# Patient Record
Sex: Male | Born: 1935
Health system: Southern US, Community
[De-identification: ages and names within clinical notes are randomized; demographics above are authoritative.]

## PROBLEM LIST (undated history)

## (undated) DIAGNOSIS — M1990 Unspecified osteoarthritis, unspecified site: Secondary | ICD-10-CM

## (undated) DIAGNOSIS — E785 Hyperlipidemia, unspecified: Secondary | ICD-10-CM

## (undated) DIAGNOSIS — Z7189 Other specified counseling: Secondary | ICD-10-CM

## (undated) DIAGNOSIS — C9000 Multiple myeloma not having achieved remission: Secondary | ICD-10-CM

## (undated) DIAGNOSIS — K909 Intestinal malabsorption, unspecified: Secondary | ICD-10-CM

## (undated) DIAGNOSIS — I10 Essential (primary) hypertension: Secondary | ICD-10-CM

## (undated) HISTORY — PX: CATARACT EXTRACTION BILATERAL W/ ANTERIOR VITRECTOMY: SHX1304

## (undated) HISTORY — DX: Unspecified osteoarthritis, unspecified site: M19.90

## (undated) HISTORY — PX: LIMBAL STEM CELL TRANSPLANT: SHX1969

## (undated) HISTORY — DX: Hyperlipidemia, unspecified: E78.5

## (undated) HISTORY — DX: Other specified counseling: Z71.89

## (undated) HISTORY — DX: Essential (primary) hypertension: I10

---
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
## 2004-07-23 DIAGNOSIS — C9000 Multiple myeloma not having achieved remission: Secondary | ICD-10-CM

## 2004-07-23 HISTORY — DX: Multiple myeloma not having achieved remission: C90.00

## 2004-09-19 ENCOUNTER — Ambulatory Visit: Payer: Self-pay | Admitting: Hematology & Oncology

## 2004-10-09 ENCOUNTER — Ambulatory Visit (HOSPITAL_COMMUNITY): Admission: RE | Admit: 2004-10-09 | Discharge: 2004-10-09 | Payer: Self-pay | Admitting: Hematology & Oncology

## 2004-10-23 ENCOUNTER — Encounter (INDEPENDENT_AMBULATORY_CARE_PROVIDER_SITE_OTHER): Payer: Self-pay | Admitting: *Deleted

## 2004-10-23 ENCOUNTER — Ambulatory Visit (HOSPITAL_COMMUNITY): Admission: RE | Admit: 2004-10-23 | Discharge: 2004-10-23 | Payer: Self-pay | Admitting: Hematology & Oncology

## 2004-10-29 ENCOUNTER — Ambulatory Visit: Payer: Self-pay | Admitting: Hematology & Oncology

## 2004-11-06 ENCOUNTER — Ambulatory Visit: Payer: Self-pay | Admitting: Hematology & Oncology

## 2004-11-17 ENCOUNTER — Ambulatory Visit (HOSPITAL_COMMUNITY): Admission: RE | Admit: 2004-11-17 | Discharge: 2004-11-17 | Payer: Self-pay | Admitting: Hematology & Oncology

## 2004-12-29 ENCOUNTER — Ambulatory Visit: Payer: Self-pay | Admitting: Hematology & Oncology

## 2005-02-17 ENCOUNTER — Ambulatory Visit (HOSPITAL_COMMUNITY): Admission: RE | Admit: 2005-02-17 | Discharge: 2005-02-17 | Payer: Self-pay | Admitting: Hematology & Oncology

## 2005-02-17 ENCOUNTER — Encounter (INDEPENDENT_AMBULATORY_CARE_PROVIDER_SITE_OTHER): Payer: Self-pay | Admitting: *Deleted

## 2005-02-17 ENCOUNTER — Ambulatory Visit: Payer: Self-pay | Admitting: Hematology & Oncology

## 2005-02-23 ENCOUNTER — Ambulatory Visit: Payer: Self-pay | Admitting: Hematology & Oncology

## 2005-03-17 ENCOUNTER — Ambulatory Visit: Admission: RE | Admit: 2005-03-17 | Discharge: 2005-03-17 | Payer: Self-pay | Admitting: Hematology & Oncology

## 2005-03-18 ENCOUNTER — Ambulatory Visit (HOSPITAL_COMMUNITY): Admission: RE | Admit: 2005-03-18 | Discharge: 2005-03-18 | Payer: Self-pay | Admitting: Hematology & Oncology

## 2005-04-13 ENCOUNTER — Ambulatory Visit: Payer: Self-pay | Admitting: Hematology & Oncology

## 2005-04-29 DIAGNOSIS — Z9481 Bone marrow transplant status: Secondary | ICD-10-CM

## 2005-04-29 HISTORY — DX: Bone marrow transplant status: Z94.81

## 2005-06-02 ENCOUNTER — Ambulatory Visit: Payer: Self-pay | Admitting: Hematology & Oncology

## 2005-07-24 ENCOUNTER — Ambulatory Visit: Payer: Self-pay | Admitting: Hematology & Oncology

## 2005-09-17 ENCOUNTER — Ambulatory Visit: Payer: Self-pay | Admitting: Hematology & Oncology

## 2005-09-18 LAB — CBC WITH DIFFERENTIAL/PLATELET
BASO%: 0.6 % (ref 0.0–2.0)
Basophils Absolute: 0 10*3/uL (ref 0.0–0.1)
EOS%: 1.1 % (ref 0.0–7.0)
Eosinophils Absolute: 0.1 10*3/uL (ref 0.0–0.5)
HCT: 34.1 % — ABNORMAL LOW (ref 38.7–49.9)
HGB: 11.6 g/dL — ABNORMAL LOW (ref 13.0–17.1)
LYMPH%: 45.8 % (ref 14.0–48.0)
MCH: 33.3 pg (ref 28.0–33.4)
MCHC: 33.9 g/dL (ref 32.0–35.9)
MCV: 98 fL (ref 81.6–98.0)
MONO#: 0.3 10*3/uL (ref 0.1–0.9)
MONO%: 6.2 % (ref 0.0–13.0)
NEUT#: 2.6 10*3/uL (ref 1.5–6.5)
NEUT%: 46.3 % (ref 40.0–75.0)
Platelets: 227 10*3/uL (ref 145–400)
RBC: 3.47 10*6/uL — ABNORMAL LOW (ref 4.20–5.71)
RDW: 14.3 % (ref 11.2–14.6)
WBC: 5.6 10*3/uL (ref 4.0–10.0)
lymph#: 2.5 10*3/uL (ref 0.9–3.3)

## 2005-09-18 LAB — COMPREHENSIVE METABOLIC PANEL
ALT: 15 U/L (ref 0–40)
AST: 20 U/L (ref 0–37)
Albumin: 4.4 g/dL (ref 3.5–5.2)
Alkaline Phosphatase: 47 U/L (ref 39–117)
BUN: 18 mg/dL (ref 6–23)
CO2: 23 mEq/L (ref 19–32)
Calcium: 9.1 mg/dL (ref 8.4–10.5)
Chloride: 105 mEq/L (ref 96–112)
Creatinine, Ser: 1.2 mg/dL (ref 0.4–1.5)
Glucose, Bld: 84 mg/dL (ref 70–99)
Potassium: 3.9 mEq/L (ref 3.5–5.3)
Sodium: 138 mEq/L (ref 135–145)
Total Bilirubin: 0.7 mg/dL (ref 0.3–1.2)
Total Protein: 7.1 g/dL (ref 6.0–8.3)

## 2005-10-16 LAB — BASIC METABOLIC PANEL
BUN: 17 mg/dL (ref 6–23)
CO2: 25 mEq/L (ref 19–32)
Calcium: 9.1 mg/dL (ref 8.4–10.5)
Chloride: 105 mEq/L (ref 96–112)
Creatinine, Ser: 1 mg/dL (ref 0.4–1.5)
Glucose, Bld: 92 mg/dL (ref 70–99)
Potassium: 3.8 mEq/L (ref 3.5–5.3)
Sodium: 137 mEq/L (ref 135–145)

## 2005-10-16 LAB — CBC WITH DIFFERENTIAL/PLATELET
BASO%: 0.4 % (ref 0.0–2.0)
Basophils Absolute: 0 10*3/uL (ref 0.0–0.1)
EOS%: 0.7 % (ref 0.0–7.0)
Eosinophils Absolute: 0 10*3/uL (ref 0.0–0.5)
HCT: 33.1 % — ABNORMAL LOW (ref 38.7–49.9)
HGB: 11.2 g/dL — ABNORMAL LOW (ref 13.0–17.1)
LYMPH%: 48.1 % — ABNORMAL HIGH (ref 14.0–48.0)
MCH: 32.7 pg (ref 28.0–33.4)
MCHC: 33.9 g/dL (ref 32.0–35.9)
MCV: 96.4 fL (ref 81.6–98.0)
MONO#: 0.3 10*3/uL (ref 0.1–0.9)
MONO%: 6.5 % (ref 0.0–13.0)
NEUT#: 2.4 10*3/uL (ref 1.5–6.5)
NEUT%: 44.3 % (ref 40.0–75.0)
Platelets: 206 10*3/uL (ref 145–400)
RBC: 3.43 10*6/uL — ABNORMAL LOW (ref 4.20–5.71)
RDW: 13.9 % (ref 11.2–14.6)
WBC: 5.4 10*3/uL (ref 4.0–10.0)
lymph#: 2.6 10*3/uL (ref 0.9–3.3)

## 2005-11-11 ENCOUNTER — Ambulatory Visit: Payer: Self-pay | Admitting: Hematology & Oncology

## 2005-11-13 LAB — BASIC METABOLIC PANEL
BUN: 15 mg/dL (ref 6–23)
CO2: 24 mEq/L (ref 19–32)
Calcium: 9.3 mg/dL (ref 8.4–10.5)
Chloride: 107 mEq/L (ref 96–112)
Creatinine, Ser: 1.17 mg/dL (ref 0.40–1.50)
Glucose, Bld: 98 mg/dL (ref 70–99)
Potassium: 4 mEq/L (ref 3.5–5.3)
Sodium: 140 mEq/L (ref 135–145)

## 2005-12-11 LAB — CBC WITH DIFFERENTIAL/PLATELET
BASO%: 0.4 % (ref 0.0–2.0)
Basophils Absolute: 0 10*3/uL (ref 0.0–0.1)
EOS%: 0.8 % (ref 0.0–7.0)
Eosinophils Absolute: 0 10*3/uL (ref 0.0–0.5)
HCT: 32.2 % — ABNORMAL LOW (ref 38.7–49.9)
HGB: 10.8 g/dL — ABNORMAL LOW (ref 13.0–17.1)
LYMPH%: 36.2 % (ref 14.0–48.0)
MCH: 32.5 pg (ref 28.0–33.4)
MCHC: 33.7 g/dL (ref 32.0–35.9)
MCV: 96.4 fL (ref 81.6–98.0)
MONO#: 0.4 10*3/uL (ref 0.1–0.9)
MONO%: 6.7 % (ref 0.0–13.0)
NEUT#: 3.1 10*3/uL (ref 1.5–6.5)
NEUT%: 55.9 % (ref 40.0–75.0)
Platelets: 220 10*3/uL (ref 145–400)
RBC: 3.34 10*6/uL — ABNORMAL LOW (ref 4.20–5.71)
RDW: 14.7 % — ABNORMAL HIGH (ref 11.2–14.6)
WBC: 5.5 10*3/uL (ref 4.0–10.0)
lymph#: 2 10*3/uL (ref 0.9–3.3)

## 2005-12-15 LAB — COMPREHENSIVE METABOLIC PANEL
ALT: 12 U/L (ref 0–40)
AST: 15 U/L (ref 0–37)
Albumin: 4.3 g/dL (ref 3.5–5.2)
Alkaline Phosphatase: 47 U/L (ref 39–117)
BUN: 16 mg/dL (ref 6–23)
CO2: 23 mEq/L (ref 19–32)
Calcium: 9 mg/dL (ref 8.4–10.5)
Chloride: 106 mEq/L (ref 96–112)
Creatinine, Ser: 1.28 mg/dL (ref 0.40–1.50)
Glucose, Bld: 96 mg/dL (ref 70–99)
Potassium: 3.7 mEq/L (ref 3.5–5.3)
Sodium: 138 mEq/L (ref 135–145)
Total Bilirubin: 1 mg/dL (ref 0.3–1.2)
Total Protein: 6.7 g/dL (ref 6.0–8.3)

## 2005-12-15 LAB — PROTEIN ELECTROPHORESIS, SERUM
Albumin ELP: 63.9 % (ref 55.8–66.1)
Alpha-1-Globulin: 3.9 % (ref 2.9–4.9)
Alpha-2-Globulin: 9.1 % (ref 7.1–11.8)
Beta 2: 3.5 % (ref 3.2–6.5)
Beta Globulin: 5.1 % (ref 4.7–7.2)
Gamma Globulin: 14.5 % (ref 11.1–18.8)
M-Spike, %: 0.06 g/dL
Total Protein, Serum Electrophoresis: 6.7 g/dL (ref 6.0–8.3)

## 2005-12-15 LAB — IGG, IGA, IGM
IgA: 81 mg/dL (ref 68–378)
IgG (Immunoglobin G), Serum: 1110 mg/dL (ref 694–1618)
IgM, Serum: 84 mg/dL (ref 60–263)

## 2005-12-15 LAB — BETA 2 MICROGLOBULIN, SERUM: Beta-2 Microglobulin: 1.86 mg/L — ABNORMAL HIGH (ref 1.01–1.73)

## 2005-12-17 LAB — BASIC METABOLIC PANEL
BUN: 17 mg/dL (ref 6–23)
CO2: 22 mEq/L (ref 19–32)
Calcium: 9.3 mg/dL (ref 8.4–10.5)
Chloride: 105 mEq/L (ref 96–112)
Creatinine, Ser: 1.14 mg/dL (ref 0.40–1.50)
Glucose, Bld: 93 mg/dL (ref 70–99)
Potassium: 3.7 mEq/L (ref 3.5–5.3)
Sodium: 136 mEq/L (ref 135–145)

## 2006-01-06 ENCOUNTER — Ambulatory Visit: Payer: Self-pay | Admitting: Hematology & Oncology

## 2006-01-08 LAB — CBC WITH DIFFERENTIAL/PLATELET
BASO%: 1.2 % (ref 0.0–2.0)
Basophils Absolute: 0.1 10*3/uL (ref 0.0–0.1)
EOS%: 0.8 % (ref 0.0–7.0)
Eosinophils Absolute: 0 10*3/uL (ref 0.0–0.5)
HCT: 36.8 % — ABNORMAL LOW (ref 38.7–49.9)
HGB: 12.4 g/dL — ABNORMAL LOW (ref 13.0–17.1)
LYMPH%: 50.7 % — ABNORMAL HIGH (ref 14.0–48.0)
MCH: 32.4 pg (ref 28.0–33.4)
MCHC: 33.6 g/dL (ref 32.0–35.9)
MCV: 96.2 fL (ref 81.6–98.0)
MONO#: 0.4 10*3/uL (ref 0.1–0.9)
MONO%: 6.8 % (ref 0.0–13.0)
NEUT#: 2.2 10*3/uL (ref 1.5–6.5)
NEUT%: 40.5 % (ref 40.0–75.0)
Platelets: 179 10*3/uL (ref 145–400)
RBC: 3.82 10*6/uL — ABNORMAL LOW (ref 4.20–5.71)
RDW: 13.2 % (ref 11.2–14.6)
WBC: 5.3 10*3/uL (ref 4.0–10.0)
lymph#: 2.7 10*3/uL (ref 0.9–3.3)

## 2006-01-12 LAB — COMPREHENSIVE METABOLIC PANEL
ALT: 15 U/L (ref 0–40)
AST: 19 U/L (ref 0–37)
Albumin: 4.3 g/dL (ref 3.5–5.2)
Alkaline Phosphatase: 47 U/L (ref 39–117)
BUN: 16 mg/dL (ref 6–23)
CO2: 23 mEq/L (ref 19–32)
Calcium: 9.3 mg/dL (ref 8.4–10.5)
Chloride: 108 mEq/L (ref 96–112)
Creatinine, Ser: 1.03 mg/dL (ref 0.40–1.50)
Glucose, Bld: 91 mg/dL (ref 70–99)
Potassium: 3.9 mEq/L (ref 3.5–5.3)
Sodium: 139 mEq/L (ref 135–145)
Total Bilirubin: 0.8 mg/dL (ref 0.3–1.2)
Total Protein: 6.8 g/dL (ref 6.0–8.3)

## 2006-01-12 LAB — PROTEIN ELECTROPHORESIS, SERUM
Albumin ELP: 60.8 % (ref 55.8–66.1)
Alpha-1-Globulin: 4.1 % (ref 2.9–4.9)
Alpha-2-Globulin: 10 % (ref 7.1–11.8)
Beta 2: 4.1 % (ref 3.2–6.5)
Beta Globulin: 5.9 % (ref 4.7–7.2)
Gamma Globulin: 15.1 % (ref 11.1–18.8)
M-Spike, %: 0.05 g/dL
Total Protein, Serum Electrophoresis: 6.8 g/dL (ref 6.0–8.3)

## 2006-02-05 LAB — CBC WITH DIFFERENTIAL/PLATELET
BASO%: 0.6 % (ref 0.0–2.0)
Basophils Absolute: 0 10*3/uL (ref 0.0–0.1)
EOS%: 1 % (ref 0.0–7.0)
Eosinophils Absolute: 0.1 10*3/uL (ref 0.0–0.5)
HCT: 34.3 % — ABNORMAL LOW (ref 38.7–49.9)
HGB: 11.8 g/dL — ABNORMAL LOW (ref 13.0–17.1)
LYMPH%: 32.5 % (ref 14.0–48.0)
MCH: 32 pg (ref 28.0–33.4)
MCHC: 34.4 g/dL (ref 32.0–35.9)
MCV: 93.3 fL (ref 81.6–98.0)
MONO#: 0.5 10*3/uL (ref 0.1–0.9)
MONO%: 7.1 % (ref 0.0–13.0)
NEUT#: 4.4 10*3/uL (ref 1.5–6.5)
NEUT%: 58.8 % (ref 40.0–75.0)
Platelets: 192 10*3/uL (ref 145–400)
RBC: 3.68 10*6/uL — ABNORMAL LOW (ref 4.20–5.71)
RDW: 12.7 % (ref 11.2–14.6)
WBC: 7.4 10*3/uL (ref 4.0–10.0)
lymph#: 2.4 10*3/uL (ref 0.9–3.3)

## 2006-02-08 LAB — COMPREHENSIVE METABOLIC PANEL
ALT: 12 U/L (ref 0–40)
AST: 13 U/L (ref 0–37)
Albumin: 4.2 g/dL (ref 3.5–5.2)
Alkaline Phosphatase: 50 U/L (ref 39–117)
BUN: 15 mg/dL (ref 6–23)
CO2: 27 mEq/L (ref 19–32)
Calcium: 9.5 mg/dL (ref 8.4–10.5)
Chloride: 106 mEq/L (ref 96–112)
Creatinine, Ser: 1.08 mg/dL (ref 0.40–1.50)
Glucose, Bld: 131 mg/dL — ABNORMAL HIGH (ref 70–99)
Potassium: 3.6 mEq/L (ref 3.5–5.3)
Sodium: 139 mEq/L (ref 135–145)
Total Bilirubin: 0.9 mg/dL (ref 0.3–1.2)
Total Protein: 6.5 g/dL (ref 6.0–8.3)

## 2006-02-08 LAB — PROTEIN ELECTROPHORESIS, SERUM
Albumin ELP: 62.5 % (ref 55.8–66.1)
Alpha-1-Globulin: 3.9 % (ref 2.9–4.9)
Alpha-2-Globulin: 9.7 % (ref 7.1–11.8)
Beta 2: 3.7 % (ref 3.2–6.5)
Beta Globulin: 5.4 % (ref 4.7–7.2)
Gamma Globulin: 14.8 % (ref 11.1–18.8)
M-Spike, %: 0.07 g/dL
Total Protein, Serum Electrophoresis: 6.5 g/dL (ref 6.0–8.3)

## 2006-02-08 LAB — IGG, IGA, IGM
IgA: 81 mg/dL (ref 68–378)
IgG (Immunoglobin G), Serum: 1070 mg/dL (ref 694–1618)
IgM, Serum: 82 mg/dL (ref 60–263)

## 2006-02-08 LAB — KAPPA/LAMBDA LIGHT CHAINS
Kappa free light chain: 1.55 mg/dL (ref 0.33–1.94)
Kappa:Lambda Ratio: 1.42 (ref 0.26–1.65)
Lambda Free Lght Chn: 1.09 mg/dL (ref 0.57–2.63)

## 2006-02-08 LAB — BETA 2 MICROGLOBULIN, SERUM: Beta-2 Microglobulin: 2.05 mg/L — ABNORMAL HIGH (ref 1.01–1.73)

## 2006-03-03 ENCOUNTER — Ambulatory Visit: Payer: Self-pay | Admitting: Hematology & Oncology

## 2006-03-05 LAB — COMPREHENSIVE METABOLIC PANEL
ALT: 10 U/L (ref 0–40)
AST: 13 U/L (ref 0–37)
Albumin: 4.3 g/dL (ref 3.5–5.2)
Alkaline Phosphatase: 50 U/L (ref 39–117)
BUN: 17 mg/dL (ref 6–23)
CO2: 24 mEq/L (ref 19–32)
Calcium: 9.3 mg/dL (ref 8.4–10.5)
Chloride: 107 mEq/L (ref 96–112)
Creatinine, Ser: 1.08 mg/dL (ref 0.40–1.50)
Glucose, Bld: 99 mg/dL (ref 70–99)
Potassium: 4 mEq/L (ref 3.5–5.3)
Sodium: 140 mEq/L (ref 135–145)
Total Bilirubin: 0.8 mg/dL (ref 0.3–1.2)
Total Protein: 6.9 g/dL (ref 6.0–8.3)

## 2006-03-05 LAB — CBC WITH DIFFERENTIAL/PLATELET
BASO%: 0.3 % (ref 0.0–2.0)
Basophils Absolute: 0 10*3/uL (ref 0.0–0.1)
EOS%: 0.9 % (ref 0.0–7.0)
Eosinophils Absolute: 0 10*3/uL (ref 0.0–0.5)
HCT: 36.2 % — ABNORMAL LOW (ref 38.7–49.9)
HGB: 12.3 g/dL — ABNORMAL LOW (ref 13.0–17.1)
LYMPH%: 49.7 % — ABNORMAL HIGH (ref 14.0–48.0)
MCH: 32.2 pg (ref 28.0–33.4)
MCHC: 34 g/dL (ref 32.0–35.9)
MCV: 94.6 fL (ref 81.6–98.0)
MONO#: 0.4 10*3/uL (ref 0.1–0.9)
MONO%: 6.7 % (ref 0.0–13.0)
NEUT#: 2.2 10*3/uL (ref 1.5–6.5)
NEUT%: 42.4 % (ref 40.0–75.0)
Platelets: 215 10*3/uL (ref 145–400)
RBC: 3.82 10*6/uL — ABNORMAL LOW (ref 4.20–5.71)
RDW: 14.3 % (ref 11.2–14.6)
WBC: 5.3 10*3/uL (ref 4.0–10.0)
lymph#: 2.6 10*3/uL (ref 0.9–3.3)

## 2006-04-02 LAB — CBC WITH DIFFERENTIAL/PLATELET
BASO%: 0.4 % (ref 0.0–2.0)
Basophils Absolute: 0 10*3/uL (ref 0.0–0.1)
EOS%: 1.2 % (ref 0.0–7.0)
Eosinophils Absolute: 0.1 10*3/uL (ref 0.0–0.5)
HCT: 33.8 % — ABNORMAL LOW (ref 38.7–49.9)
HGB: 11.5 g/dL — ABNORMAL LOW (ref 13.0–17.1)
LYMPH%: 40.6 % (ref 14.0–48.0)
MCH: 31.9 pg (ref 28.0–33.4)
MCHC: 34.2 g/dL (ref 32.0–35.9)
MCV: 93.3 fL (ref 81.6–98.0)
MONO#: 0.3 10*3/uL (ref 0.1–0.9)
MONO%: 7.7 % (ref 0.0–13.0)
NEUT#: 2.3 10*3/uL (ref 1.5–6.5)
NEUT%: 50.1 % (ref 40.0–75.0)
Platelets: 228 10*3/uL (ref 145–400)
RBC: 3.62 10*6/uL — ABNORMAL LOW (ref 4.20–5.71)
RDW: 14.7 % — ABNORMAL HIGH (ref 11.2–14.6)
WBC: 4.5 10*3/uL (ref 4.0–10.0)
lymph#: 1.8 10*3/uL (ref 0.9–3.3)

## 2006-04-05 LAB — PROTEIN ELECTROPHORESIS, SERUM
Albumin ELP: 62 % (ref 55.8–66.1)
Alpha-1-Globulin: 3.9 % (ref 2.9–4.9)
Alpha-2-Globulin: 9.5 % (ref 7.1–11.8)
Beta 2: 3.9 % (ref 3.2–6.5)
Beta Globulin: 5.6 % (ref 4.7–7.2)
Gamma Globulin: 15.1 % (ref 11.1–18.8)
Total Protein, Serum Electrophoresis: 6.8 g/dL (ref 6.0–8.3)

## 2006-04-05 LAB — COMPREHENSIVE METABOLIC PANEL
ALT: 13 U/L (ref 0–53)
AST: 14 U/L (ref 0–37)
Albumin: 4.1 g/dL (ref 3.5–5.2)
Alkaline Phosphatase: 48 U/L (ref 39–117)
BUN: 17 mg/dL (ref 6–23)
CO2: 26 mEq/L (ref 19–32)
Calcium: 9.4 mg/dL (ref 8.4–10.5)
Chloride: 108 mEq/L (ref 96–112)
Creatinine, Ser: 1.1 mg/dL (ref 0.40–1.50)
Glucose, Bld: 92 mg/dL (ref 70–99)
Potassium: 4.1 mEq/L (ref 3.5–5.3)
Sodium: 140 mEq/L (ref 135–145)
Total Bilirubin: 0.8 mg/dL (ref 0.3–1.2)
Total Protein: 6.8 g/dL (ref 6.0–8.3)

## 2006-04-05 LAB — IGG, IGA, IGM
IgA: 86 mg/dL (ref 68–378)
IgG (Immunoglobin G), Serum: 1150 mg/dL (ref 694–1618)
IgM, Serum: 101 mg/dL (ref 60–263)

## 2006-04-05 LAB — BETA 2 MICROGLOBULIN, SERUM: Beta-2 Microglobulin: 1.65 mg/L (ref 1.01–1.73)

## 2006-04-05 LAB — LACTATE DEHYDROGENASE: LDH: 169 U/L (ref 94–250)

## 2006-04-05 LAB — KAPPA/LAMBDA LIGHT CHAINS
Kappa free light chain: 1.75 mg/dL (ref 0.33–1.94)
Kappa:Lambda Ratio: 1.86 — ABNORMAL HIGH (ref 0.26–1.65)
Lambda Free Lght Chn: 0.94 mg/dL (ref 0.57–2.63)

## 2006-04-27 ENCOUNTER — Ambulatory Visit: Payer: Self-pay | Admitting: Hematology & Oncology

## 2006-04-30 LAB — BASIC METABOLIC PANEL
BUN: 14 mg/dL (ref 6–23)
CO2: 25 mEq/L (ref 19–32)
Calcium: 9.3 mg/dL (ref 8.4–10.5)
Chloride: 107 mEq/L (ref 96–112)
Creatinine, Ser: 1.09 mg/dL (ref 0.40–1.50)
Glucose, Bld: 112 mg/dL — ABNORMAL HIGH (ref 70–99)
Potassium: 3.9 mEq/L (ref 3.5–5.3)
Sodium: 141 mEq/L (ref 135–145)

## 2006-05-28 LAB — CBC WITH DIFFERENTIAL/PLATELET
BASO%: 0.7 % (ref 0.0–2.0)
Basophils Absolute: 0 10*3/uL (ref 0.0–0.1)
EOS%: 2 % (ref 0.0–7.0)
Eosinophils Absolute: 0.1 10*3/uL (ref 0.0–0.5)
HCT: 33.9 % — ABNORMAL LOW (ref 38.7–49.9)
HGB: 11.7 g/dL — ABNORMAL LOW (ref 13.0–17.1)
LYMPH%: 46.4 % (ref 14.0–48.0)
MCH: 31.3 pg (ref 28.0–33.4)
MCHC: 34.4 g/dL (ref 32.0–35.9)
MCV: 90.7 fL (ref 81.6–98.0)
MONO#: 0.4 10*3/uL (ref 0.1–0.9)
MONO%: 7.5 % (ref 0.0–13.0)
NEUT#: 2.5 10*3/uL (ref 1.5–6.5)
NEUT%: 43.3 % (ref 40.0–75.0)
Platelets: 192 10*3/uL (ref 145–400)
RBC: 3.73 10*6/uL — ABNORMAL LOW (ref 4.20–5.71)
RDW: 12 % (ref 11.2–14.6)
WBC: 5.7 10*3/uL (ref 4.0–10.0)
lymph#: 2.6 10*3/uL (ref 0.9–3.3)

## 2006-06-01 LAB — PROTEIN ELECTROPHORESIS, SERUM
Albumin ELP: 61.4 % (ref 55.8–66.1)
Alpha-1-Globulin: 4 % (ref 2.9–4.9)
Alpha-2-Globulin: 10.5 % (ref 7.1–11.8)
Beta 2: 3.9 % (ref 3.2–6.5)
Beta Globulin: 5.6 % (ref 4.7–7.2)
Gamma Globulin: 14.6 % (ref 11.1–18.8)
Total Protein, Serum Electrophoresis: 7 g/dL (ref 6.0–8.3)

## 2006-06-01 LAB — IGG, IGA, IGM
IgA: 89 mg/dL (ref 68–378)
IgG (Immunoglobin G), Serum: 1130 mg/dL (ref 694–1618)
IgM, Serum: 105 mg/dL (ref 60–263)

## 2006-06-01 LAB — COMPREHENSIVE METABOLIC PANEL
ALT: 11 U/L (ref 0–53)
AST: 17 U/L (ref 0–37)
Albumin: 4.5 g/dL (ref 3.5–5.2)
Alkaline Phosphatase: 49 U/L (ref 39–117)
BUN: 19 mg/dL (ref 6–23)
CO2: 25 mEq/L (ref 19–32)
Calcium: 9.5 mg/dL (ref 8.4–10.5)
Chloride: 105 mEq/L (ref 96–112)
Creatinine, Ser: 1.12 mg/dL (ref 0.40–1.50)
Glucose, Bld: 91 mg/dL (ref 70–99)
Potassium: 4 mEq/L (ref 3.5–5.3)
Sodium: 140 mEq/L (ref 135–145)
Total Bilirubin: 0.8 mg/dL (ref 0.3–1.2)
Total Protein: 7 g/dL (ref 6.0–8.3)

## 2006-06-01 LAB — LACTATE DEHYDROGENASE: LDH: 206 U/L (ref 94–250)

## 2006-06-01 LAB — BETA 2 MICROGLOBULIN, SERUM: Beta-2 Microglobulin: 1.69 mg/L (ref 1.01–1.73)

## 2006-06-23 ENCOUNTER — Ambulatory Visit: Payer: Self-pay | Admitting: Hematology & Oncology

## 2006-06-23 LAB — CBC WITH DIFFERENTIAL/PLATELET
BASO%: 1 % (ref 0.0–2.0)
Basophils Absolute: 0.1 10*3/uL (ref 0.0–0.1)
EOS%: 1.1 % (ref 0.0–7.0)
Eosinophils Absolute: 0.1 10*3/uL (ref 0.0–0.5)
HCT: 34.7 % — ABNORMAL LOW (ref 38.7–49.9)
HGB: 11.9 g/dL — ABNORMAL LOW (ref 13.0–17.1)
LYMPH%: 46.5 % (ref 14.0–48.0)
MCH: 31.8 pg (ref 28.0–33.4)
MCHC: 34.3 g/dL (ref 32.0–35.9)
MCV: 92.8 fL (ref 81.6–98.0)
MONO#: 0.4 10*3/uL (ref 0.1–0.9)
MONO%: 6.3 % (ref 0.0–13.0)
NEUT#: 2.8 10*3/uL (ref 1.5–6.5)
NEUT%: 45.1 % (ref 40.0–75.0)
Platelets: 207 10*3/uL (ref 145–400)
RBC: 3.74 10*6/uL — ABNORMAL LOW (ref 4.20–5.71)
RDW: 12.1 % (ref 11.2–14.6)
WBC: 6.3 10*3/uL (ref 4.0–10.0)
lymph#: 2.9 10*3/uL (ref 0.9–3.3)

## 2006-06-23 LAB — BASIC METABOLIC PANEL
BUN: 14 mg/dL (ref 6–23)
CO2: 26 mEq/L (ref 19–32)
Calcium: 9.3 mg/dL (ref 8.4–10.5)
Chloride: 105 mEq/L (ref 96–112)
Creatinine, Ser: 1.08 mg/dL (ref 0.40–1.50)
Glucose, Bld: 86 mg/dL (ref 70–99)
Potassium: 4.2 mEq/L (ref 3.5–5.3)
Sodium: 140 mEq/L (ref 135–145)

## 2006-07-23 LAB — BASIC METABOLIC PANEL
BUN: 17 mg/dL (ref 6–23)
CO2: 26 mEq/L (ref 19–32)
Calcium: 9.1 mg/dL (ref 8.4–10.5)
Chloride: 107 mEq/L (ref 96–112)
Creatinine, Ser: 1.05 mg/dL (ref 0.40–1.50)
Glucose, Bld: 105 mg/dL — ABNORMAL HIGH (ref 70–99)
Potassium: 3.9 mEq/L (ref 3.5–5.3)
Sodium: 140 mEq/L (ref 135–145)

## 2006-08-04 ENCOUNTER — Ambulatory Visit: Payer: Self-pay | Admitting: Hematology & Oncology

## 2006-08-06 LAB — CBC WITH DIFFERENTIAL/PLATELET
BASO%: 0.2 % (ref 0.0–2.0)
Basophils Absolute: 0 10*3/uL (ref 0.0–0.1)
EOS%: 1 % (ref 0.0–7.0)
Eosinophils Absolute: 0.1 10*3/uL (ref 0.0–0.5)
HCT: 35.3 % — ABNORMAL LOW (ref 38.7–49.9)
HGB: 12.1 g/dL — ABNORMAL LOW (ref 13.0–17.1)
LYMPH%: 36.5 % (ref 14.0–48.0)
MCH: 31.2 pg (ref 28.0–33.4)
MCHC: 34.1 g/dL (ref 32.0–35.9)
MCV: 91.5 fL (ref 81.6–98.0)
MONO#: 0.4 10*3/uL (ref 0.1–0.9)
MONO%: 6.9 % (ref 0.0–13.0)
NEUT#: 3 10*3/uL (ref 1.5–6.5)
NEUT%: 55.4 % (ref 40.0–75.0)
Platelets: 243 10*3/uL (ref 145–400)
RBC: 3.86 10*6/uL — ABNORMAL LOW (ref 4.20–5.71)
RDW: 14.1 % (ref 11.2–14.6)
WBC: 5.5 10*3/uL (ref 4.0–10.0)
lymph#: 2 10*3/uL (ref 0.9–3.3)

## 2006-08-10 LAB — IGG, IGA, IGM
IgA: 88 mg/dL (ref 68–378)
IgG (Immunoglobin G), Serum: 1150 mg/dL (ref 694–1618)
IgM, Serum: 97 mg/dL (ref 60–263)

## 2006-08-10 LAB — COMPREHENSIVE METABOLIC PANEL
ALT: 11 U/L (ref 0–53)
AST: 15 U/L (ref 0–37)
Albumin: 4.2 g/dL (ref 3.5–5.2)
Alkaline Phosphatase: 57 U/L (ref 39–117)
BUN: 16 mg/dL (ref 6–23)
CO2: 27 mEq/L (ref 19–32)
Calcium: 9.4 mg/dL (ref 8.4–10.5)
Chloride: 104 mEq/L (ref 96–112)
Creatinine, Ser: 1.25 mg/dL (ref 0.40–1.50)
Glucose, Bld: 95 mg/dL (ref 70–99)
Potassium: 3.9 mEq/L (ref 3.5–5.3)
Sodium: 140 mEq/L (ref 135–145)
Total Bilirubin: 0.9 mg/dL (ref 0.3–1.2)
Total Protein: 6.9 g/dL (ref 6.0–8.3)

## 2006-08-10 LAB — PROTEIN ELECTROPHORESIS, SERUM
Albumin ELP: 61.4 % (ref 55.8–66.1)
Alpha-1-Globulin: 4.1 % (ref 2.9–4.9)
Alpha-2-Globulin: 10.1 % (ref 7.1–11.8)
Beta 2: 4 % (ref 3.2–6.5)
Beta Globulin: 5.7 % (ref 4.7–7.2)
Gamma Globulin: 14.7 % (ref 11.1–18.8)
Total Protein, Serum Electrophoresis: 6.9 g/dL (ref 6.0–8.3)

## 2006-08-10 LAB — KAPPA/LAMBDA LIGHT CHAINS
Kappa free light chain: 1.19 mg/dL (ref 0.33–1.94)
Kappa:Lambda Ratio: 1.17 (ref 0.26–1.65)
Lambda Free Lght Chn: 1.02 mg/dL (ref 0.57–2.63)

## 2006-08-10 LAB — BETA 2 MICROGLOBULIN, SERUM: Beta-2 Microglobulin: 2.06 mg/L — ABNORMAL HIGH (ref 1.01–1.73)

## 2006-08-20 LAB — BASIC METABOLIC PANEL
BUN: 15 mg/dL (ref 6–23)
CO2: 23 mEq/L (ref 19–32)
Calcium: 9.6 mg/dL (ref 8.4–10.5)
Chloride: 107 mEq/L (ref 96–112)
Creatinine, Ser: 1.02 mg/dL (ref 0.40–1.50)
Glucose, Bld: 88 mg/dL (ref 70–99)
Potassium: 3.9 mEq/L (ref 3.5–5.3)
Sodium: 140 mEq/L (ref 135–145)

## 2006-09-17 LAB — BASIC METABOLIC PANEL
BUN: 13 mg/dL (ref 6–23)
CO2: 24 mEq/L (ref 19–32)
Calcium: 9.8 mg/dL (ref 8.4–10.5)
Chloride: 107 mEq/L (ref 96–112)
Creatinine, Ser: 1.18 mg/dL (ref 0.40–1.50)
Glucose, Bld: 81 mg/dL (ref 70–99)
Potassium: 4.1 mEq/L (ref 3.5–5.3)
Sodium: 142 mEq/L (ref 135–145)

## 2006-10-13 ENCOUNTER — Ambulatory Visit: Payer: Self-pay | Admitting: Hematology & Oncology

## 2006-10-15 LAB — BASIC METABOLIC PANEL
BUN: 21 mg/dL (ref 6–23)
CO2: 25 mEq/L (ref 19–32)
Calcium: 9.5 mg/dL (ref 8.4–10.5)
Chloride: 106 mEq/L (ref 96–112)
Creatinine, Ser: 1.15 mg/dL (ref 0.40–1.50)
Glucose, Bld: 94 mg/dL (ref 70–99)
Potassium: 4.6 mEq/L (ref 3.5–5.3)
Sodium: 141 mEq/L (ref 135–145)

## 2006-11-12 LAB — CBC WITH DIFFERENTIAL/PLATELET
BASO%: 1.1 % (ref 0.0–2.0)
Basophils Absolute: 0.1 10*3/uL (ref 0.0–0.1)
EOS%: 1.6 % (ref 0.0–7.0)
Eosinophils Absolute: 0.1 10*3/uL (ref 0.0–0.5)
HCT: 34.2 % — ABNORMAL LOW (ref 38.7–49.9)
HGB: 11.8 g/dL — ABNORMAL LOW (ref 13.0–17.1)
LYMPH%: 42.6 % (ref 14.0–48.0)
MCH: 31.7 pg (ref 28.0–33.4)
MCHC: 34.6 g/dL (ref 32.0–35.9)
MCV: 91.6 fL (ref 81.6–98.0)
MONO#: 0.5 10*3/uL (ref 0.1–0.9)
MONO%: 7.3 % (ref 0.0–13.0)
NEUT#: 3.1 10*3/uL (ref 1.5–6.5)
NEUT%: 47.4 % (ref 40.0–75.0)
Platelets: 207 10*3/uL (ref 145–400)
RBC: 3.74 10*6/uL — ABNORMAL LOW (ref 4.20–5.71)
RDW: 12.1 % (ref 11.2–14.6)
WBC: 6.6 10*3/uL (ref 4.0–10.0)
lymph#: 2.8 10*3/uL (ref 0.9–3.3)

## 2006-11-16 LAB — COMPREHENSIVE METABOLIC PANEL
ALT: 16 U/L (ref 0–53)
AST: 20 U/L (ref 0–37)
Albumin: 4.7 g/dL (ref 3.5–5.2)
Alkaline Phosphatase: 64 U/L (ref 39–117)
BUN: 15 mg/dL (ref 6–23)
CO2: 24 mEq/L (ref 19–32)
Calcium: 9.9 mg/dL (ref 8.4–10.5)
Chloride: 105 mEq/L (ref 96–112)
Creatinine, Ser: 1.2 mg/dL (ref 0.40–1.50)
Glucose, Bld: 87 mg/dL (ref 70–99)
Potassium: 3.8 mEq/L (ref 3.5–5.3)
Sodium: 141 mEq/L (ref 135–145)
Total Bilirubin: 0.9 mg/dL (ref 0.3–1.2)
Total Protein: 7.3 g/dL (ref 6.0–8.3)

## 2006-11-16 LAB — PROTEIN ELECTROPHORESIS, SERUM
Albumin ELP: 61.3 % (ref 55.8–66.1)
Alpha-1-Globulin: 4.4 % (ref 2.9–4.9)
Alpha-2-Globulin: 10.1 % (ref 7.1–11.8)
Beta 2: 4 % (ref 3.2–6.5)
Beta Globulin: 5.8 % (ref 4.7–7.2)
Gamma Globulin: 14.4 % (ref 11.1–18.8)
Total Protein, Serum Electrophoresis: 7.3 g/dL (ref 6.0–8.3)

## 2006-11-16 LAB — IGG, IGA, IGM
IgA: 101 mg/dL (ref 68–378)
IgG (Immunoglobin G), Serum: 1090 mg/dL (ref 694–1618)
IgM, Serum: 112 mg/dL (ref 60–263)

## 2006-11-16 LAB — BETA 2 MICROGLOBULIN, SERUM: Beta-2 Microglobulin: 2.01 mg/L — ABNORMAL HIGH (ref 1.01–1.73)

## 2007-02-02 ENCOUNTER — Ambulatory Visit: Payer: Self-pay | Admitting: Hematology & Oncology

## 2007-02-04 LAB — CBC WITH DIFFERENTIAL/PLATELET
BASO%: 0.4 % (ref 0.0–2.0)
Basophils Absolute: 0 10*3/uL (ref 0.0–0.1)
EOS%: 1.2 % (ref 0.0–7.0)
Eosinophils Absolute: 0.1 10*3/uL (ref 0.0–0.5)
HCT: 33.8 % — ABNORMAL LOW (ref 38.7–49.9)
HGB: 11.8 g/dL — ABNORMAL LOW (ref 13.0–17.1)
LYMPH%: 42 % (ref 14.0–48.0)
MCH: 32.4 pg (ref 28.0–33.4)
MCHC: 35 g/dL (ref 32.0–35.9)
MCV: 92.7 fL (ref 81.6–98.0)
MONO#: 0.4 10*3/uL (ref 0.1–0.9)
MONO%: 7.3 % (ref 0.0–13.0)
NEUT#: 2.8 10*3/uL (ref 1.5–6.5)
NEUT%: 49.1 % (ref 40.0–75.0)
Platelets: 234 10*3/uL (ref 145–400)
RBC: 3.65 10*6/uL — ABNORMAL LOW (ref 4.20–5.71)
RDW: 14.4 % (ref 11.2–14.6)
WBC: 5.7 10*3/uL (ref 4.0–10.0)
lymph#: 2.4 10*3/uL (ref 0.9–3.3)

## 2007-02-08 LAB — IGG, IGA, IGM
IgA: 91 mg/dL (ref 68–378)
IgG (Immunoglobin G), Serum: 1050 mg/dL (ref 694–1618)
IgM, Serum: 118 mg/dL (ref 60–263)

## 2007-02-08 LAB — PROTEIN ELECTROPHORESIS, SERUM
Albumin ELP: 62 % (ref 55.8–66.1)
Alpha-1-Globulin: 3.8 % (ref 2.9–4.9)
Alpha-2-Globulin: 10 % (ref 7.1–11.8)
Beta 2: 4 % (ref 3.2–6.5)
Beta Globulin: 5.5 % (ref 4.7–7.2)
Gamma Globulin: 14.7 % (ref 11.1–18.8)
Total Protein, Serum Electrophoresis: 7 g/dL (ref 6.0–8.3)

## 2007-02-08 LAB — COMPREHENSIVE METABOLIC PANEL
ALT: 14 U/L (ref 0–53)
AST: 16 U/L (ref 0–37)
Albumin: 4.4 g/dL (ref 3.5–5.2)
Alkaline Phosphatase: 53 U/L (ref 39–117)
BUN: 15 mg/dL (ref 6–23)
CO2: 26 mEq/L (ref 19–32)
Calcium: 9.5 mg/dL (ref 8.4–10.5)
Chloride: 104 mEq/L (ref 96–112)
Creatinine, Ser: 1.21 mg/dL (ref 0.40–1.50)
Glucose, Bld: 93 mg/dL (ref 70–99)
Potassium: 3.7 mEq/L (ref 3.5–5.3)
Sodium: 140 mEq/L (ref 135–145)
Total Bilirubin: 0.9 mg/dL (ref 0.3–1.2)
Total Protein: 7 g/dL (ref 6.0–8.3)

## 2007-02-08 LAB — BETA 2 MICROGLOBULIN, SERUM: Beta-2 Microglobulin: 2.19 mg/L — ABNORMAL HIGH (ref 1.01–1.73)

## 2007-02-08 LAB — KAPPA/LAMBDA LIGHT CHAINS
Kappa free light chain: 1.05 mg/dL (ref 0.33–1.94)
Kappa:Lambda Ratio: 1 (ref 0.26–1.65)
Lambda Free Lght Chn: 1.05 mg/dL (ref 0.57–2.63)

## 2007-05-02 ENCOUNTER — Ambulatory Visit: Payer: Self-pay | Admitting: Hematology & Oncology

## 2007-05-04 LAB — CBC WITH DIFFERENTIAL/PLATELET
BASO%: 1 % (ref 0.0–2.0)
Basophils Absolute: 0.1 10*3/uL (ref 0.0–0.1)
EOS%: 1.6 % (ref 0.0–7.0)
Eosinophils Absolute: 0.1 10*3/uL (ref 0.0–0.5)
HCT: 34.6 % — ABNORMAL LOW (ref 38.7–49.9)
HGB: 12.5 g/dL — ABNORMAL LOW (ref 13.0–17.1)
LYMPH%: 50.8 % — ABNORMAL HIGH (ref 14.0–48.0)
MCH: 32.2 pg (ref 28.0–33.4)
MCHC: 36 g/dL — ABNORMAL HIGH (ref 32.0–35.9)
MCV: 89.4 fL (ref 81.6–98.0)
MONO#: 0.4 10*3/uL (ref 0.1–0.9)
MONO%: 5.8 % (ref 0.0–13.0)
NEUT#: 2.6 10*3/uL (ref 1.5–6.5)
NEUT%: 40.8 % (ref 40.0–75.0)
Platelets: 209 10*3/uL (ref 145–400)
RBC: 3.87 10*6/uL — ABNORMAL LOW (ref 4.20–5.71)
RDW: 11.7 % (ref 11.2–14.6)
WBC: 6.3 10*3/uL (ref 4.0–10.0)
lymph#: 3.2 10*3/uL (ref 0.9–3.3)

## 2007-05-06 LAB — COMPREHENSIVE METABOLIC PANEL
ALT: 14 U/L (ref 0–53)
AST: 19 U/L (ref 0–37)
Albumin: 4.2 g/dL (ref 3.5–5.2)
Alkaline Phosphatase: 53 U/L (ref 39–117)
BUN: 16 mg/dL (ref 6–23)
CO2: 26 mEq/L (ref 19–32)
Calcium: 9.9 mg/dL (ref 8.4–10.5)
Chloride: 102 mEq/L (ref 96–112)
Creatinine, Ser: 1.11 mg/dL (ref 0.40–1.50)
Glucose, Bld: 129 mg/dL — ABNORMAL HIGH (ref 70–99)
Potassium: 3.8 mEq/L (ref 3.5–5.3)
Sodium: 137 mEq/L (ref 135–145)
Total Bilirubin: 0.7 mg/dL (ref 0.3–1.2)
Total Protein: 6.9 g/dL (ref 6.0–8.3)

## 2007-05-06 LAB — PROTEIN ELECTROPHORESIS, SERUM
Albumin ELP: 61.9 % (ref 55.8–66.1)
Alpha-1-Globulin: 3.9 % (ref 2.9–4.9)
Alpha-2-Globulin: 9.7 % (ref 7.1–11.8)
Beta 2: 4.4 % (ref 3.2–6.5)
Beta Globulin: 5.5 % (ref 4.7–7.2)
Gamma Globulin: 14.6 % (ref 11.1–18.8)
Total Protein, Serum Electrophoresis: 6.9 g/dL (ref 6.0–8.3)

## 2007-05-06 LAB — IGG, IGA, IGM
IgA: 91 mg/dL (ref 68–378)
IgG (Immunoglobin G), Serum: 1050 mg/dL (ref 694–1618)
IgM, Serum: 107 mg/dL (ref 60–263)

## 2007-05-06 LAB — BETA 2 MICROGLOBULIN, SERUM: Beta-2 Microglobulin: 2.2 mg/L — ABNORMAL HIGH (ref 1.01–1.73)

## 2007-08-01 ENCOUNTER — Ambulatory Visit: Payer: Self-pay | Admitting: Hematology & Oncology

## 2007-08-03 LAB — COMPREHENSIVE METABOLIC PANEL
ALT: 18 U/L (ref 0–53)
AST: 24 U/L (ref 0–37)
Albumin: 5 g/dL (ref 3.5–5.2)
Alkaline Phosphatase: 59 U/L (ref 39–117)
BUN: 13 mg/dL (ref 6–23)
CO2: 24 mEq/L (ref 19–32)
Calcium: 9.7 mg/dL (ref 8.4–10.5)
Chloride: 103 mEq/L (ref 96–112)
Creatinine, Ser: 1.24 mg/dL (ref 0.40–1.50)
Glucose, Bld: 79 mg/dL (ref 70–99)
Potassium: 3.9 mEq/L (ref 3.5–5.3)
Sodium: 140 mEq/L (ref 135–145)
Total Bilirubin: 1.2 mg/dL (ref 0.3–1.2)
Total Protein: 7.8 g/dL (ref 6.0–8.3)

## 2007-08-03 LAB — CBC WITH DIFFERENTIAL/PLATELET
BASO%: 0.8 % (ref 0.0–2.0)
Basophils Absolute: 0.1 10*3/uL (ref 0.0–0.1)
EOS%: 1.4 % (ref 0.0–7.0)
Eosinophils Absolute: 0.1 10*3/uL (ref 0.0–0.5)
HCT: 34.1 % — ABNORMAL LOW (ref 38.7–49.9)
HGB: 11.7 g/dL — ABNORMAL LOW (ref 13.0–17.1)
LYMPH%: 47.6 % (ref 14.0–48.0)
MCH: 31.8 pg (ref 28.0–33.4)
MCHC: 34.4 g/dL (ref 32.0–35.9)
MCV: 92.3 fL (ref 81.6–98.0)
MONO#: 0.5 10*3/uL (ref 0.1–0.9)
MONO%: 7.1 % (ref 0.0–13.0)
NEUT#: 2.9 10*3/uL (ref 1.5–6.5)
NEUT%: 43.2 % (ref 40.0–75.0)
Platelets: 188 10*3/uL (ref 145–400)
RBC: 3.69 10*6/uL — ABNORMAL LOW (ref 4.20–5.71)
RDW: 13 % (ref 11.2–14.6)
WBC: 6.6 10*3/uL (ref 4.0–10.0)
lymph#: 3.1 10*3/uL (ref 0.9–3.3)

## 2007-08-09 LAB — BETA 2 MICROGLOBULIN, SERUM: Beta-2 Microglobulin: 1.82 mg/L — ABNORMAL HIGH (ref 1.01–1.73)

## 2007-08-09 LAB — IGG, IGA, IGM
IgA: 87 mg/dL (ref 68–378)
IgG (Immunoglobin G), Serum: 1100 mg/dL (ref 694–1618)
IgM, Serum: 130 mg/dL (ref 60–263)

## 2007-08-09 LAB — PROTEIN ELECTROPHORESIS, SERUM
Albumin ELP: 60.3 % (ref 55.8–66.1)
Alpha-1-Globulin: 4.3 % (ref 2.9–4.9)
Alpha-2-Globulin: 10.9 % (ref 7.1–11.8)
Beta 2: 3.9 % (ref 3.2–6.5)
Beta Globulin: 5.7 % (ref 4.7–7.2)
Gamma Globulin: 14.9 % (ref 11.1–18.8)
Total Protein, Serum Electrophoresis: 7.7 g/dL (ref 6.0–8.3)

## 2007-08-09 LAB — KAPPA/LAMBDA LIGHT CHAINS
Kappa free light chain: 1.03 mg/dL (ref 0.33–1.94)
Kappa:Lambda Ratio: 1.36 (ref 0.26–1.65)
Lambda Free Lght Chn: 0.76 mg/dL (ref 0.57–2.63)

## 2007-10-31 ENCOUNTER — Ambulatory Visit: Payer: Self-pay | Admitting: Hematology & Oncology

## 2007-11-02 LAB — COMPREHENSIVE METABOLIC PANEL
ALT: 17 U/L (ref 0–53)
AST: 23 U/L (ref 0–37)
Albumin: 3.8 g/dL (ref 3.5–5.2)
Alkaline Phosphatase: 52 U/L (ref 39–117)
BUN: 17 mg/dL (ref 6–23)
CO2: 28 mEq/L (ref 19–32)
Calcium: 9.5 mg/dL (ref 8.4–10.5)
Chloride: 101 mEq/L (ref 96–112)
Creatinine, Ser: 1.24 mg/dL (ref 0.40–1.50)
Glucose, Bld: 96 mg/dL (ref 70–99)
Potassium: 3.6 mEq/L (ref 3.5–5.3)
Sodium: 137 mEq/L (ref 135–145)
Total Bilirubin: 0.9 mg/dL (ref 0.3–1.2)
Total Protein: 6.8 g/dL (ref 6.0–8.3)

## 2007-11-02 LAB — CBC WITH DIFFERENTIAL/PLATELET
BASO%: 0.4 % (ref 0.0–2.0)
Basophils Absolute: 0 10*3/uL (ref 0.0–0.1)
EOS%: 0.8 % (ref 0.0–7.0)
Eosinophils Absolute: 0 10*3/uL (ref 0.0–0.5)
HCT: 33.5 % — ABNORMAL LOW (ref 38.7–49.9)
HGB: 11.2 g/dL — ABNORMAL LOW (ref 13.0–17.1)
LYMPH%: 45.8 % (ref 14.0–48.0)
MCH: 30.6 pg (ref 28.0–33.4)
MCHC: 33.5 g/dL (ref 32.0–35.9)
MCV: 91.2 fL (ref 81.6–98.0)
MONO#: 0.4 10*3/uL (ref 0.1–0.9)
MONO%: 7.6 % (ref 0.0–13.0)
NEUT#: 2.5 10*3/uL (ref 1.5–6.5)
NEUT%: 45.4 % (ref 40.0–75.0)
Platelets: 231 10*3/uL (ref 145–400)
RBC: 3.67 10*6/uL — ABNORMAL LOW (ref 4.20–5.71)
RDW: 14.2 % (ref 11.2–14.6)
WBC: 5.4 10*3/uL (ref 4.0–10.0)
lymph#: 2.5 10*3/uL (ref 0.9–3.3)

## 2007-11-04 LAB — PROTEIN ELECTROPHORESIS, SERUM
Albumin ELP: 58.7 % (ref 55.8–66.1)
Alpha-1-Globulin: 4.7 % (ref 2.9–4.9)
Alpha-2-Globulin: 12.2 % — ABNORMAL HIGH (ref 7.1–11.8)
Beta 2: 4.1 % (ref 3.2–6.5)
Beta Globulin: 5.5 % (ref 4.7–7.2)
Gamma Globulin: 14.8 % (ref 11.1–18.8)
Total Protein, Serum Electrophoresis: 7.1 g/dL (ref 6.0–8.3)

## 2007-11-04 LAB — KAPPA/LAMBDA LIGHT CHAINS
Kappa free light chain: 1.05 mg/dL (ref 0.33–1.94)
Kappa:Lambda Ratio: 0.31 (ref 0.26–1.65)
Lambda Free Lght Chn: 3.37 mg/dL — ABNORMAL HIGH (ref 0.57–2.63)

## 2007-11-04 LAB — IGG, IGA, IGM
IgA: 96 mg/dL (ref 68–378)
IgG (Immunoglobin G), Serum: 1040 mg/dL (ref 694–1618)
IgM, Serum: 141 mg/dL (ref 60–263)

## 2007-11-04 LAB — BETA 2 MICROGLOBULIN, SERUM: Beta-2 Microglobulin: 2.18 mg/L — ABNORMAL HIGH (ref 1.01–1.73)

## 2008-02-27 ENCOUNTER — Ambulatory Visit: Payer: Self-pay | Admitting: Hematology & Oncology

## 2008-02-28 ENCOUNTER — Ambulatory Visit: Payer: Self-pay | Admitting: Hematology & Oncology

## 2008-02-29 LAB — CBC WITH DIFFERENTIAL (CANCER CENTER ONLY)
BASO#: 0 10*3/uL (ref 0.0–0.2)
BASO%: 0.4 % (ref 0.0–2.0)
EOS%: 1.6 % (ref 0.0–7.0)
Eosinophils Absolute: 0.1 10*3/uL (ref 0.0–0.5)
HCT: 37.5 % — ABNORMAL LOW (ref 38.7–49.9)
HGB: 13 g/dL (ref 13.0–17.1)
LYMPH#: 2.8 10*3/uL (ref 0.9–3.3)
LYMPH%: 46.1 % (ref 14.0–48.0)
MCH: 30.7 pg (ref 28.0–33.4)
MCHC: 34.6 g/dL (ref 32.0–35.9)
MCV: 89 fL (ref 82–98)
MONO#: 0.3 10*3/uL (ref 0.1–0.9)
MONO%: 5.1 % (ref 0.0–13.0)
NEUT#: 2.9 10*3/uL (ref 1.5–6.5)
NEUT%: 46.8 % (ref 40.0–80.0)
Platelets: 226 10*3/uL (ref 145–400)
RBC: 4.21 10*6/uL (ref 4.20–5.70)
RDW: 13.5 % (ref 10.5–14.6)
WBC: 6.2 10*3/uL (ref 4.0–10.0)

## 2008-02-29 LAB — COMPREHENSIVE METABOLIC PANEL
ALT: 13 U/L (ref 0–53)
AST: 14 U/L (ref 0–37)
Albumin: 4.3 g/dL (ref 3.5–5.2)
Alkaline Phosphatase: 54 U/L (ref 39–117)
BUN: 15 mg/dL (ref 6–23)
CO2: 22 mEq/L (ref 19–32)
Calcium: 10.1 mg/dL (ref 8.4–10.5)
Chloride: 102 mEq/L (ref 96–112)
Creatinine, Ser: 1.09 mg/dL (ref 0.40–1.50)
Glucose, Bld: 144 mg/dL — ABNORMAL HIGH (ref 70–99)
Potassium: 4.1 mEq/L (ref 3.5–5.3)
Sodium: 136 mEq/L (ref 135–145)
Total Bilirubin: 0.9 mg/dL (ref 0.3–1.2)
Total Protein: 7 g/dL (ref 6.0–8.3)

## 2008-03-02 LAB — IGG, IGA, IGM
IgA: 102 mg/dL (ref 68–378)
IgG (Immunoglobin G), Serum: 1160 mg/dL (ref 694–1618)
IgM, Serum: 164 mg/dL (ref 60–263)

## 2008-03-02 LAB — PROTEIN ELECTROPHORESIS, SERUM
Albumin ELP: 60.8 % (ref 55.8–66.1)
Alpha-1-Globulin: 3.6 % (ref 2.9–4.9)
Alpha-2-Globulin: 9.6 % (ref 7.1–11.8)
Beta 2: 4.5 % (ref 3.2–6.5)
Beta Globulin: 5.4 % (ref 4.7–7.2)
Gamma Globulin: 16.1 % (ref 11.1–18.8)
Total Protein, Serum Electrophoresis: 7 g/dL (ref 6.0–8.3)

## 2008-03-02 LAB — KAPPA/LAMBDA LIGHT CHAINS
Kappa free light chain: 0.97 mg/dL (ref 0.33–1.94)
Kappa:Lambda Ratio: 0.7 (ref 0.26–1.65)
Lambda Free Lght Chn: 1.39 mg/dL (ref 0.57–2.63)

## 2008-03-02 LAB — BETA 2 MICROGLOBULIN, SERUM: Beta-2 Microglobulin: 1.69 mg/L (ref 1.01–1.73)

## 2008-06-27 ENCOUNTER — Ambulatory Visit: Payer: Self-pay | Admitting: Hematology & Oncology

## 2008-06-28 LAB — CBC WITH DIFFERENTIAL (CANCER CENTER ONLY)
BASO#: 0 10*3/uL (ref 0.0–0.2)
BASO%: 0.4 % (ref 0.0–2.0)
EOS%: 1.9 % (ref 0.0–7.0)
Eosinophils Absolute: 0.2 10*3/uL (ref 0.0–0.5)
HCT: 33.2 % — ABNORMAL LOW (ref 38.7–49.9)
HGB: 11.2 g/dL — ABNORMAL LOW (ref 13.0–17.1)
LYMPH#: 3 10*3/uL (ref 0.9–3.3)
LYMPH%: 38.1 % (ref 14.0–48.0)
MCH: 30.4 pg (ref 28.0–33.4)
MCHC: 33.6 g/dL (ref 32.0–35.9)
MCV: 90 fL (ref 82–98)
MONO#: 0.5 10*3/uL (ref 0.1–0.9)
MONO%: 5.8 % (ref 0.0–13.0)
NEUT#: 4.3 10*3/uL (ref 1.5–6.5)
NEUT%: 53.8 % (ref 40.0–80.0)
Platelets: 228 10*3/uL (ref 145–400)
RBC: 3.68 10*6/uL — ABNORMAL LOW (ref 4.20–5.70)
RDW: 12.5 % (ref 10.5–14.6)
WBC: 7.9 10*3/uL (ref 4.0–10.0)

## 2008-07-02 LAB — IGG, IGA, IGM
IgA: 104 mg/dL (ref 68–378)
IgG (Immunoglobin G), Serum: 1230 mg/dL (ref 694–1618)
IgM, Serum: 165 mg/dL (ref 60–263)

## 2008-07-02 LAB — PROTEIN ELECTROPHORESIS, SERUM
Albumin ELP: 56.9 % (ref 55.8–66.1)
Alpha-1-Globulin: 4.9 % (ref 2.9–4.9)
Alpha-2-Globulin: 13 % — ABNORMAL HIGH (ref 7.1–11.8)
Beta 2: 4.8 % (ref 3.2–6.5)
Beta Globulin: 5.2 % (ref 4.7–7.2)
Gamma Globulin: 15.2 % (ref 11.1–18.8)
Total Protein, Serum Electrophoresis: 7.5 g/dL (ref 6.0–8.3)

## 2008-07-02 LAB — COMPREHENSIVE METABOLIC PANEL
ALT: 19 U/L (ref 0–53)
AST: 22 U/L (ref 0–37)
Albumin: 4.4 g/dL (ref 3.5–5.2)
Alkaline Phosphatase: 54 U/L (ref 39–117)
BUN: 16 mg/dL (ref 6–23)
CO2: 24 mEq/L (ref 19–32)
Calcium: 9.5 mg/dL (ref 8.4–10.5)
Chloride: 102 mEq/L (ref 96–112)
Creatinine, Ser: 1 mg/dL (ref 0.40–1.50)
Glucose, Bld: 93 mg/dL (ref 70–99)
Potassium: 4.2 mEq/L (ref 3.5–5.3)
Sodium: 136 mEq/L (ref 135–145)
Total Bilirubin: 0.8 mg/dL (ref 0.3–1.2)
Total Protein: 7.5 g/dL (ref 6.0–8.3)

## 2008-07-02 LAB — BETA 2 MICROGLOBULIN, SERUM: Beta-2 Microglobulin: 1.82 mg/L — ABNORMAL HIGH (ref 1.01–1.73)

## 2008-07-02 LAB — KAPPA/LAMBDA LIGHT CHAINS
Kappa free light chain: 0.93 mg/dL (ref 0.33–1.94)
Kappa:Lambda Ratio: 0.68 (ref 0.26–1.65)
Lambda Free Lght Chn: 1.37 mg/dL (ref 0.57–2.63)

## 2008-10-24 ENCOUNTER — Ambulatory Visit: Payer: Self-pay | Admitting: Hematology & Oncology

## 2008-10-25 LAB — CBC WITH DIFFERENTIAL (CANCER CENTER ONLY)
BASO#: 0 10*3/uL (ref 0.0–0.2)
BASO%: 0.3 % (ref 0.0–2.0)
EOS%: 1.9 % (ref 0.0–7.0)
Eosinophils Absolute: 0.1 10*3/uL (ref 0.0–0.5)
HCT: 36.1 % — ABNORMAL LOW (ref 38.7–49.9)
HGB: 11.8 g/dL — ABNORMAL LOW (ref 13.0–17.1)
LYMPH#: 2.9 10*3/uL (ref 0.9–3.3)
LYMPH%: 48.5 % — ABNORMAL HIGH (ref 14.0–48.0)
MCH: 30.4 pg (ref 28.0–33.4)
MCHC: 32.7 g/dL (ref 32.0–35.9)
MCV: 93 fL (ref 82–98)
MONO#: 0.4 10*3/uL (ref 0.1–0.9)
MONO%: 6.5 % (ref 0.0–13.0)
NEUT#: 2.6 10*3/uL (ref 1.5–6.5)
NEUT%: 42.8 % (ref 40.0–80.0)
Platelets: 200 10*3/uL (ref 145–400)
RBC: 3.88 10*6/uL — ABNORMAL LOW (ref 4.20–5.70)
RDW: 12.1 % (ref 10.5–14.6)
WBC: 6 10*3/uL (ref 4.0–10.0)

## 2008-10-29 LAB — COMPREHENSIVE METABOLIC PANEL
ALT: 20 U/L (ref 0–53)
AST: 22 U/L (ref 0–37)
Albumin: 4.3 g/dL (ref 3.5–5.2)
Alkaline Phosphatase: 51 U/L (ref 39–117)
BUN: 19 mg/dL (ref 6–23)
CO2: 24 mEq/L (ref 19–32)
Calcium: 9.8 mg/dL (ref 8.4–10.5)
Chloride: 105 mEq/L (ref 96–112)
Creatinine, Ser: 1.13 mg/dL (ref 0.40–1.50)
Glucose, Bld: 93 mg/dL (ref 70–99)
Potassium: 3.7 mEq/L (ref 3.5–5.3)
Sodium: 141 mEq/L (ref 135–145)
Total Bilirubin: 0.8 mg/dL (ref 0.3–1.2)
Total Protein: 7.3 g/dL (ref 6.0–8.3)

## 2008-10-29 LAB — SPEP & IFE WITH QIG
Albumin ELP: 58.8 % (ref 55.8–66.1)
Alpha-1-Globulin: 4.1 % (ref 2.9–4.9)
Alpha-2-Globulin: 10.3 % (ref 7.1–11.8)
Beta 2: 4.6 % (ref 3.2–6.5)
Beta Globulin: 5.8 % (ref 4.7–7.2)
Gamma Globulin: 16.4 % (ref 11.1–18.8)
IgA: 109 mg/dL (ref 68–378)
IgG (Immunoglobin G), Serum: 1200 mg/dL (ref 694–1618)
IgM, Serum: 164 mg/dL (ref 60–263)
Total Protein, Serum Electrophoresis: 7.3 g/dL (ref 6.0–8.3)

## 2008-10-29 LAB — KAPPA/LAMBDA LIGHT CHAINS
Kappa free light chain: 1.06 mg/dL (ref 0.33–1.94)
Kappa:Lambda Ratio: 1.01 (ref 0.26–1.65)
Lambda Free Lght Chn: 1.05 mg/dL (ref 0.57–2.63)

## 2008-10-29 LAB — FERRITIN: Ferritin: 266 ng/mL (ref 22–322)

## 2008-10-29 LAB — BETA 2 MICROGLOBULIN, SERUM: Beta-2 Microglobulin: 2.27 mg/L — ABNORMAL HIGH (ref 1.01–1.73)

## 2009-02-26 ENCOUNTER — Ambulatory Visit: Payer: Self-pay | Admitting: Hematology & Oncology

## 2009-02-28 LAB — CBC WITH DIFFERENTIAL (CANCER CENTER ONLY)
BASO#: 0 10*3/uL (ref 0.0–0.2)
BASO%: 0.4 % (ref 0.0–2.0)
EOS%: 1.8 % (ref 0.0–7.0)
Eosinophils Absolute: 0.1 10*3/uL (ref 0.0–0.5)
HCT: 35.3 % — ABNORMAL LOW (ref 38.7–49.9)
HGB: 12.1 g/dL — ABNORMAL LOW (ref 13.0–17.1)
LYMPH#: 3.1 10*3/uL (ref 0.9–3.3)
LYMPH%: 50.3 % — ABNORMAL HIGH (ref 14.0–48.0)
MCH: 31.6 pg (ref 28.0–33.4)
MCHC: 34.2 g/dL (ref 32.0–35.9)
MCV: 92 fL (ref 82–98)
MONO#: 0.3 10*3/uL (ref 0.1–0.9)
MONO%: 5.3 % (ref 0.0–13.0)
NEUT#: 2.6 10*3/uL (ref 1.5–6.5)
NEUT%: 42.2 % (ref 40.0–80.0)
Platelets: 176 10*3/uL (ref 145–400)
RBC: 3.82 10*6/uL — ABNORMAL LOW (ref 4.20–5.70)
RDW: 12.5 % (ref 10.5–14.6)
WBC: 6.2 10*3/uL (ref 4.0–10.0)

## 2009-03-04 LAB — KAPPA/LAMBDA LIGHT CHAINS
Kappa free light chain: 1.07 mg/dL (ref 0.33–1.94)
Kappa:Lambda Ratio: 1.01 (ref 0.26–1.65)
Lambda Free Lght Chn: 1.06 mg/dL (ref 0.57–2.63)

## 2009-03-04 LAB — SPEP & IFE WITH QIG
Albumin ELP: 59.6 % (ref 55.8–66.1)
Alpha-1-Globulin: 4 % (ref 2.9–4.9)
Alpha-2-Globulin: 10.5 % (ref 7.1–11.8)
Beta 2: 4 % (ref 3.2–6.5)
Beta Globulin: 5.5 % (ref 4.7–7.2)
Gamma Globulin: 16.4 % (ref 11.1–18.8)
IgA: 108 mg/dL (ref 68–378)
IgG (Immunoglobin G), Serum: 1200 mg/dL (ref 694–1618)
IgM, Serum: 174 mg/dL (ref 60–263)
M-Spike, %: 0.06 g/dL
Total Protein, Serum Electrophoresis: 7 g/dL (ref 6.0–8.3)

## 2009-03-04 LAB — COMPREHENSIVE METABOLIC PANEL
ALT: 19 U/L (ref 0–53)
AST: 22 U/L (ref 0–37)
Albumin: 4.4 g/dL (ref 3.5–5.2)
Alkaline Phosphatase: 50 U/L (ref 39–117)
BUN: 15 mg/dL (ref 6–23)
CO2: 25 mEq/L (ref 19–32)
Calcium: 9.5 mg/dL (ref 8.4–10.5)
Chloride: 101 mEq/L (ref 96–112)
Creatinine, Ser: 1.22 mg/dL (ref 0.40–1.50)
Glucose, Bld: 137 mg/dL — ABNORMAL HIGH (ref 70–99)
Potassium: 3.6 mEq/L (ref 3.5–5.3)
Sodium: 136 mEq/L (ref 135–145)
Total Bilirubin: 0.9 mg/dL (ref 0.3–1.2)
Total Protein: 7 g/dL (ref 6.0–8.3)

## 2009-03-04 LAB — BETA 2 MICROGLOBULIN, SERUM: Beta-2 Microglobulin: 1.68 mg/L (ref 1.01–1.73)

## 2009-06-06 ENCOUNTER — Ambulatory Visit: Payer: Self-pay | Admitting: Hematology & Oncology

## 2009-07-04 LAB — CBC WITH DIFFERENTIAL (CANCER CENTER ONLY)
BASO#: 0 10*3/uL (ref 0.0–0.2)
BASO%: 0.5 % (ref 0.0–2.0)
EOS%: 2 % (ref 0.0–7.0)
Eosinophils Absolute: 0.1 10*3/uL (ref 0.0–0.5)
HCT: 36 % — ABNORMAL LOW (ref 38.7–49.9)
HGB: 12.1 g/dL — ABNORMAL LOW (ref 13.0–17.1)
LYMPH#: 3.3 10*3/uL (ref 0.9–3.3)
LYMPH%: 52.3 % — ABNORMAL HIGH (ref 14.0–48.0)
MCH: 31.2 pg (ref 28.0–33.4)
MCHC: 33.7 g/dL (ref 32.0–35.9)
MCV: 93 fL (ref 82–98)
MONO#: 0.4 10*3/uL (ref 0.1–0.9)
MONO%: 6.1 % (ref 0.0–13.0)
NEUT#: 2.5 10*3/uL (ref 1.5–6.5)
NEUT%: 39.1 % — ABNORMAL LOW (ref 40.0–80.0)
Platelets: 198 10*3/uL (ref 145–400)
RBC: 3.88 10*6/uL — ABNORMAL LOW (ref 4.20–5.70)
RDW: 12.8 % (ref 10.5–14.6)
WBC: 6.3 10*3/uL (ref 4.0–10.0)

## 2009-07-10 LAB — SPEP & IFE WITH QIG
Albumin ELP: 59.6 % (ref 55.8–66.1)
Alpha-1-Globulin: 4.3 % (ref 2.9–4.9)
Alpha-2-Globulin: 10.4 % (ref 7.1–11.8)
Beta 2: 3.5 % (ref 3.2–6.5)
Beta Globulin: 5.9 % (ref 4.7–7.2)
Gamma Globulin: 16.3 % (ref 11.1–18.8)
IgA: 109 mg/dL (ref 68–378)
IgG (Immunoglobin G), Serum: 1210 mg/dL (ref 694–1618)
IgM, Serum: 180 mg/dL (ref 60–263)
M-Spike, %: 0.07 g/dL
Total Protein, Serum Electrophoresis: 7.3 g/dL (ref 6.0–8.3)

## 2009-07-10 LAB — BETA 2 MICROGLOBULIN, SERUM: Beta-2 Microglobulin: 1.74 mg/L — ABNORMAL HIGH (ref 1.01–1.73)

## 2009-07-10 LAB — COMPREHENSIVE METABOLIC PANEL
ALT: 35 U/L (ref 0–53)
AST: 30 U/L (ref 0–37)
Albumin: 4.7 g/dL (ref 3.5–5.2)
Alkaline Phosphatase: 49 U/L (ref 39–117)
BUN: 17 mg/dL (ref 6–23)
CO2: 27 mEq/L (ref 19–32)
Calcium: 10 mg/dL (ref 8.4–10.5)
Chloride: 101 mEq/L (ref 96–112)
Creatinine, Ser: 1.09 mg/dL (ref 0.40–1.50)
Glucose, Bld: 100 mg/dL — ABNORMAL HIGH (ref 70–99)
Potassium: 4.2 mEq/L (ref 3.5–5.3)
Sodium: 136 mEq/L (ref 135–145)
Total Bilirubin: 0.7 mg/dL (ref 0.3–1.2)
Total Protein: 7.3 g/dL (ref 6.0–8.3)

## 2009-07-10 LAB — KAPPA/LAMBDA LIGHT CHAINS
Kappa free light chain: 1.02 mg/dL (ref 0.33–1.94)
Kappa:Lambda Ratio: 0.76 (ref 0.26–1.65)
Lambda Free Lght Chn: 1.35 mg/dL (ref 0.57–2.63)

## 2009-11-06 ENCOUNTER — Ambulatory Visit: Payer: Self-pay | Admitting: Hematology & Oncology

## 2009-11-07 LAB — CBC WITH DIFFERENTIAL (CANCER CENTER ONLY)
BASO#: 0 10*3/uL (ref 0.0–0.2)
BASO%: 0.4 % (ref 0.0–2.0)
EOS%: 2.7 % (ref 0.0–7.0)
Eosinophils Absolute: 0.2 10*3/uL (ref 0.0–0.5)
HCT: 34.9 % — ABNORMAL LOW (ref 38.7–49.9)
HGB: 12.1 g/dL — ABNORMAL LOW (ref 13.0–17.1)
LYMPH#: 3.2 10*3/uL (ref 0.9–3.3)
LYMPH%: 47.2 % (ref 14.0–48.0)
MCH: 31.9 pg (ref 28.0–33.4)
MCHC: 34.8 g/dL (ref 32.0–35.9)
MCV: 92 fL (ref 82–98)
MONO#: 0.4 10*3/uL (ref 0.1–0.9)
MONO%: 5.5 % (ref 0.0–13.0)
NEUT#: 3 10*3/uL (ref 1.5–6.5)
NEUT%: 44.2 % (ref 40.0–80.0)
Platelets: 201 10*3/uL (ref 145–400)
RBC: 3.81 10*6/uL — ABNORMAL LOW (ref 4.20–5.70)
RDW: 11.7 % (ref 10.5–14.6)
WBC: 6.7 10*3/uL (ref 4.0–10.0)

## 2009-11-12 LAB — COMPREHENSIVE METABOLIC PANEL
ALT: 20 U/L (ref 0–53)
AST: 23 U/L (ref 0–37)
Albumin: 4.8 g/dL (ref 3.5–5.2)
Alkaline Phosphatase: 55 U/L (ref 39–117)
BUN: 17 mg/dL (ref 6–23)
CO2: 23 mEq/L (ref 19–32)
Calcium: 9.4 mg/dL (ref 8.4–10.5)
Chloride: 102 mEq/L (ref 96–112)
Creatinine, Ser: 1.35 mg/dL (ref 0.40–1.50)
Glucose, Bld: 91 mg/dL (ref 70–99)
Potassium: 3.6 mEq/L (ref 3.5–5.3)
Sodium: 138 mEq/L (ref 135–145)
Total Bilirubin: 1 mg/dL (ref 0.3–1.2)
Total Protein: 7.9 g/dL (ref 6.0–8.3)

## 2009-11-12 LAB — PROTEIN ELECTROPHORESIS, SERUM
Albumin ELP: 58.1 % (ref 55.8–66.1)
Alpha-1-Globulin: 3.9 % (ref 2.9–4.9)
Alpha-2-Globulin: 9.7 % (ref 7.1–11.8)
Beta 2: 5 % (ref 3.2–6.5)
Beta Globulin: 6 % (ref 4.7–7.2)
Gamma Globulin: 17.3 % (ref 11.1–18.8)
Total Protein, Serum Electrophoresis: 7.9 g/dL (ref 6.0–8.3)

## 2009-11-12 LAB — IGG, IGA, IGM
IgA: 127 mg/dL (ref 68–378)
IgG (Immunoglobin G), Serum: 1250 mg/dL (ref 694–1618)
IgM, Serum: 204 mg/dL (ref 60–263)

## 2009-11-12 LAB — KAPPA/LAMBDA LIGHT CHAINS
Kappa free light chain: 0.88 mg/dL (ref 0.33–1.94)
Kappa:Lambda Ratio: 0.87 (ref 0.26–1.65)
Lambda Free Lght Chn: 1.01 mg/dL (ref 0.57–2.63)

## 2009-11-12 LAB — BETA 2 MICROGLOBULIN, SERUM: Beta-2 Microglobulin: 2.06 mg/L — ABNORMAL HIGH (ref 1.01–1.73)

## 2010-02-17 DIAGNOSIS — E538 Deficiency of other specified B group vitamins: Secondary | ICD-10-CM

## 2010-02-17 HISTORY — DX: Deficiency of other specified B group vitamins: E53.8

## 2010-03-05 ENCOUNTER — Ambulatory Visit: Payer: Self-pay | Admitting: Hematology & Oncology

## 2010-03-06 LAB — CBC WITH DIFFERENTIAL (CANCER CENTER ONLY)
BASO#: 0 10e3/uL (ref 0.0–0.2)
BASO%: 0.6 % (ref 0.0–2.0)
EOS%: 1.9 % (ref 0.0–7.0)
Eosinophils Absolute: 0.1 10e3/uL (ref 0.0–0.5)
HCT: 35.4 % — ABNORMAL LOW (ref 38.7–49.9)
HGB: 12 g/dL — ABNORMAL LOW (ref 13.0–17.1)
LYMPH#: 3.3 10e3/uL (ref 0.9–3.3)
LYMPH%: 48.9 % — ABNORMAL HIGH (ref 14.0–48.0)
MCH: 31.5 pg (ref 28.0–33.4)
MCHC: 33.8 g/dL (ref 32.0–35.9)
MCV: 93 fL (ref 82–98)
MONO#: 0.4 10e3/uL (ref 0.1–0.9)
MONO%: 5.2 % (ref 0.0–13.0)
NEUT#: 2.9 10e3/uL (ref 1.5–6.5)
NEUT%: 43.4 % (ref 40.0–80.0)
Platelets: 200 10e3/uL (ref 145–400)
RBC: 3.81 10e6/uL — ABNORMAL LOW (ref 4.20–5.70)
RDW: 12.4 % (ref 10.5–14.6)
WBC: 6.8 10e3/uL (ref 4.0–10.0)

## 2010-03-11 LAB — SPEP & IFE WITH QIG
Albumin ELP: 59.3 % (ref 55.8–66.1)
Alpha-1-Globulin: 6.3 % — ABNORMAL HIGH (ref 2.9–4.9)
Alpha-2-Globulin: 8.8 % (ref 7.1–11.8)
Beta 2: 4.1 % (ref 3.2–6.5)
Beta Globulin: 5.8 % (ref 4.7–7.2)
Gamma Globulin: 15.7 % (ref 11.1–18.8)
IgA: 131 mg/dL (ref 68–378)
IgG (Immunoglobin G), Serum: 1250 mg/dL (ref 694–1618)
IgM, Serum: 201 mg/dL (ref 60–263)
Total Protein, Serum Electrophoresis: 7.1 g/dL (ref 6.0–8.3)

## 2010-03-11 LAB — COMPREHENSIVE METABOLIC PANEL
ALT: 24 U/L (ref 0–53)
AST: 22 U/L (ref 0–37)
Albumin: 4.5 g/dL (ref 3.5–5.2)
Alkaline Phosphatase: 52 U/L (ref 39–117)
BUN: 14 mg/dL (ref 6–23)
CO2: 26 mEq/L (ref 19–32)
Calcium: 10.1 mg/dL (ref 8.4–10.5)
Chloride: 101 mEq/L (ref 96–112)
Creatinine, Ser: 1.11 mg/dL (ref 0.40–1.50)
Glucose, Bld: 100 mg/dL — ABNORMAL HIGH (ref 70–99)
Potassium: 4 mEq/L (ref 3.5–5.3)
Sodium: 139 mEq/L (ref 135–145)
Total Bilirubin: 0.9 mg/dL (ref 0.3–1.2)
Total Protein: 7.1 g/dL (ref 6.0–8.3)

## 2010-03-11 LAB — KAPPA/LAMBDA LIGHT CHAINS
Kappa free light chain: <0.6 mg/dL (ref 0.33–1.94)
Lambda Free Lght Chn: 1.49 mg/dL (ref 0.57–2.63)

## 2010-03-11 LAB — BETA 2 MICROGLOBULIN, SERUM: Beta-2 Microglobulin: 1.82 mg/L — ABNORMAL HIGH (ref 1.01–1.73)

## 2010-03-19 DIAGNOSIS — I10 Essential (primary) hypertension: Secondary | ICD-10-CM | POA: Insufficient documentation

## 2010-03-19 DIAGNOSIS — C9001 Multiple myeloma in remission: Secondary | ICD-10-CM

## 2010-03-19 HISTORY — DX: Essential (primary) hypertension: I10

## 2010-03-19 HISTORY — DX: Multiple myeloma in remission: C90.01

## 2010-06-02 DIAGNOSIS — N529 Male erectile dysfunction, unspecified: Secondary | ICD-10-CM

## 2010-06-02 HISTORY — DX: Male erectile dysfunction, unspecified: N52.9

## 2010-07-07 ENCOUNTER — Other Ambulatory Visit: Payer: Self-pay | Admitting: Hematology & Oncology

## 2010-07-07 ENCOUNTER — Encounter (HOSPITAL_BASED_OUTPATIENT_CLINIC_OR_DEPARTMENT_OTHER): Payer: Medicare Other | Admitting: Hematology & Oncology

## 2010-07-07 DIAGNOSIS — C9000 Multiple myeloma not having achieved remission: Secondary | ICD-10-CM

## 2010-07-07 LAB — CBC WITH DIFFERENTIAL (CANCER CENTER ONLY)
BASO#: 0 10*3/uL (ref 0.0–0.2)
BASO%: 0.3 % (ref 0.0–2.0)
EOS%: 3.5 % (ref 0.0–7.0)
Eosinophils Absolute: 0.2 10*3/uL (ref 0.0–0.5)
HCT: 37.2 % — ABNORMAL LOW (ref 38.7–49.9)
HGB: 12.4 g/dL — ABNORMAL LOW (ref 13.0–17.1)
LYMPH#: 2.7 10*3/uL (ref 0.9–3.3)
LYMPH%: 40 % (ref 14.0–48.0)
MCH: 30.8 pg (ref 28.0–33.4)
MCHC: 33.4 g/dL (ref 32.0–35.9)
MCV: 92 fL (ref 82–98)
MONO#: 0.4 10*3/uL (ref 0.1–0.9)
MONO%: 6.1 % (ref 0.0–13.0)
NEUT#: 3.3 10*3/uL (ref 1.5–6.5)
NEUT%: 50.1 % (ref 40.0–80.0)
Platelets: 222 10*3/uL (ref 145–400)
RBC: 4.03 10*6/uL — ABNORMAL LOW (ref 4.20–5.70)
RDW: 12.6 % (ref 10.5–14.6)
WBC: 6.7 10*3/uL (ref 4.0–10.0)

## 2010-07-07 LAB — CMP (CANCER CENTER ONLY)
ALT(SGPT): 25 U/L (ref 10–47)
AST: 26 U/L (ref 11–38)
Albumin: 3.9 g/dL (ref 3.3–5.5)
Alkaline Phosphatase: 50 U/L (ref 26–84)
BUN, Bld: 15 mg/dL (ref 7–22)
CO2: 31 mEq/L (ref 18–33)
Calcium: 10 mg/dL (ref 8.0–10.3)
Chloride: 99 mEq/L (ref 98–108)
Creat: 1.2 mg/dl (ref 0.6–1.2)
Glucose, Bld: 116 mg/dL (ref 73–118)
Potassium: 4.2 mEq/L (ref 3.3–4.7)
Sodium: 143 mEq/L (ref 128–145)
Total Bilirubin: 1 mg/dl (ref 0.20–1.60)
Total Protein: 7.8 g/dL (ref 6.4–8.1)

## 2010-07-09 LAB — BASIC METABOLIC PANEL
BUN: 16 mg/dL (ref 6–23)
CO2: 28 mEq/L (ref 19–32)
Calcium: 10 mg/dL (ref 8.4–10.5)
Chloride: 99 mEq/L (ref 96–112)
Creatinine, Ser: 1.24 mg/dL (ref 0.40–1.50)
Glucose, Bld: 106 mg/dL — ABNORMAL HIGH (ref 70–99)
Potassium: 3.7 mEq/L (ref 3.5–5.3)
Sodium: 137 mEq/L (ref 135–145)

## 2010-07-09 LAB — KAPPA/LAMBDA LIGHT CHAINS
Kappa free light chain: 1.26 mg/dL (ref 0.33–1.94)
Kappa:Lambda Ratio: 1 (ref 0.26–1.65)
Lambda Free Lght Chn: 1.26 mg/dL (ref 0.57–2.63)

## 2010-07-09 LAB — SPEP & IFE WITH QIG
Albumin ELP: 57.8 % (ref 55.8–66.1)
Alpha-1-Globulin: 4.2 % (ref 2.9–4.9)
Alpha-2-Globulin: 9.9 % (ref 7.1–11.8)
Beta 2: 4.9 % (ref 3.2–6.5)
Beta Globulin: 5.8 % (ref 4.7–7.2)
Gamma Globulin: 17.4 % (ref 11.1–18.8)
IgA: 128 mg/dL (ref 68–378)
IgG (Immunoglobin G), Serum: 1330 mg/dL (ref 694–1618)
IgM, Serum: 215 mg/dL (ref 60–263)
Total Protein, Serum Electrophoresis: 7 g/dL (ref 6.0–8.3)

## 2010-07-09 LAB — BETA 2 MICROGLOBULIN, SERUM: Beta-2 Microglobulin: 1.9 mg/L — ABNORMAL HIGH (ref 1.01–1.73)

## 2010-10-10 NOTE — Op Note (Signed)
Robert [name], MACKEN                 ACCOUNT NO.:  [id number]   MEDICAL RECORD NO.:  [id number]          PATIENT TYPE:  OUT   LOCATION:  [facility]                         FACILITY:  [facility]   PHYSICIAN:  Rose Phi. Myna Hidalgo, M.D. DATE OF BIRTH:  04/13/1934   DATE OF PROCEDURE:  02/17/2005  DATE OF DISCHARGE:                                 OPERATIVE REPORT   NATURE OF PROCEDURE:  Left posterior iliac crest bone marrow biopsy and  aspirate.   Mr. Lottman was brought to the short stay unit. He had an IV placed into his  left hand. He was then placed on his right side.   He received a total of 5 mg of Versed and 25 mg of Demerol for IV sedation.   The left posterior iliac crest region was prepped and draped in a sterile  fashion. 10 mL of 2% lidocaine was infiltrated under the skin down to the  periosteum.   A #11 scalpel was used to make an incision into the skin. Two bone marrow  aspirates were obtained without difficulty.   A second incision was made into the skin. An excellent bone biopsy core was  obtained without difficulty.   The patient tolerated the procedure well. There were no complications.      Rose Phi. Myna Hidalgo, M.D.  Electronically Signed     PRE/MEDQ  D:  02/17/2005  T:  02/17/2005  Job:  [id number]

## 2010-10-27 ENCOUNTER — Encounter (HOSPITAL_BASED_OUTPATIENT_CLINIC_OR_DEPARTMENT_OTHER): Payer: Medicare Other | Admitting: Hematology & Oncology

## 2010-10-27 ENCOUNTER — Other Ambulatory Visit: Payer: Self-pay | Admitting: Hematology & Oncology

## 2010-10-27 DIAGNOSIS — C9000 Multiple myeloma not having achieved remission: Secondary | ICD-10-CM

## 2010-10-27 DIAGNOSIS — I10 Essential (primary) hypertension: Secondary | ICD-10-CM

## 2010-10-27 LAB — CHCC SATELLITE - SMEAR

## 2010-10-27 LAB — CBC WITH DIFFERENTIAL (CANCER CENTER ONLY)
BASO#: 0 10*3/uL (ref 0.0–0.2)
BASO%: 0.3 % (ref 0.0–2.0)
EOS%: 3 % (ref 0.0–7.0)
Eosinophils Absolute: 0.2 10*3/uL (ref 0.0–0.5)
HCT: 35.8 % — ABNORMAL LOW (ref 38.7–49.9)
HGB: 12.1 g/dL — ABNORMAL LOW (ref 13.0–17.1)
LYMPH#: 2.4 10*3/uL (ref 0.9–3.3)
LYMPH%: 40.9 % (ref 14.0–48.0)
MCH: 31 pg (ref 28.0–33.4)
MCHC: 33.8 g/dL (ref 32.0–35.9)
MCV: 92 fL (ref 82–98)
MONO#: 0.4 10*3/uL (ref 0.1–0.9)
MONO%: 7.3 % (ref 0.0–13.0)
NEUT#: 2.9 10*3/uL (ref 1.5–6.5)
NEUT%: 48.5 % (ref 40.0–80.0)
Platelets: 184 10*3/uL (ref 145–400)
RBC: 3.9 10*6/uL — ABNORMAL LOW (ref 4.20–5.70)
RDW: 13.2 % (ref 11.1–15.7)
WBC: 5.9 10*3/uL (ref 4.0–10.0)

## 2010-10-29 LAB — KAPPA/LAMBDA LIGHT CHAINS
Kappa free light chain: 2.62 mg/dL — ABNORMAL HIGH (ref 0.33–1.94)
Kappa:Lambda Ratio: 1.28 (ref 0.26–1.65)
Lambda Free Lght Chn: 2.05 mg/dL (ref 0.57–2.63)

## 2010-10-29 LAB — SPEP & IFE WITH QIG
Albumin ELP: 57.5 % (ref 55.8–66.1)
Alpha-1-Globulin: 4 % (ref 2.9–4.9)
Alpha-2-Globulin: 10.2 % (ref 7.1–11.8)
Beta 2: 4.5 % (ref 3.2–6.5)
Beta Globulin: 6 % (ref 4.7–7.2)
Gamma Globulin: 17.8 % (ref 11.1–18.8)
IgA: 140 mg/dL (ref 68–379)
IgG (Immunoglobin G), Serum: 1430 mg/dL (ref 650–1600)
IgM, Serum: 218 mg/dL (ref 41–251)
Total Protein, Serum Electrophoresis: 7.5 g/dL (ref 6.0–8.3)

## 2010-10-29 LAB — COMPREHENSIVE METABOLIC PANEL
ALT: 21 U/L (ref 0–53)
AST: 26 U/L (ref 0–37)
Albumin: 4.6 g/dL (ref 3.5–5.2)
Alkaline Phosphatase: 58 U/L (ref 39–117)
BUN: 15 mg/dL (ref 6–23)
CO2: 25 mEq/L (ref 19–32)
Calcium: 10.1 mg/dL (ref 8.4–10.5)
Chloride: 102 mEq/L (ref 96–112)
Creatinine, Ser: 1.19 mg/dL (ref 0.50–1.35)
Glucose, Bld: 97 mg/dL (ref 70–99)
Potassium: 3.4 mEq/L — ABNORMAL LOW (ref 3.5–5.3)
Sodium: 138 mEq/L (ref 135–145)
Total Bilirubin: 0.8 mg/dL (ref 0.3–1.2)
Total Protein: 7.5 g/dL (ref 6.0–8.3)

## 2010-10-29 LAB — LACTATE DEHYDROGENASE: LDH: 177 U/L (ref 94–250)

## 2011-02-26 ENCOUNTER — Other Ambulatory Visit: Payer: Self-pay | Admitting: Family

## 2011-02-26 ENCOUNTER — Encounter (HOSPITAL_BASED_OUTPATIENT_CLINIC_OR_DEPARTMENT_OTHER): Payer: Medicare Other | Admitting: Hematology & Oncology

## 2011-02-26 DIAGNOSIS — I10 Essential (primary) hypertension: Secondary | ICD-10-CM

## 2011-02-26 DIAGNOSIS — C9000 Multiple myeloma not having achieved remission: Secondary | ICD-10-CM

## 2011-02-26 LAB — CBC WITH DIFFERENTIAL (CANCER CENTER ONLY)
BASO#: 0 10*3/uL (ref 0.0–0.2)
BASO%: 0.2 % (ref 0.0–2.0)
EOS%: 2.5 % (ref 0.0–7.0)
Eosinophils Absolute: 0.2 10*3/uL (ref 0.0–0.5)
HCT: 34.4 % — ABNORMAL LOW (ref 38.7–49.9)
HGB: 11.6 g/dL — ABNORMAL LOW (ref 13.0–17.1)
LYMPH#: 2.3 10*3/uL (ref 0.9–3.3)
LYMPH%: 37 % (ref 14.0–48.0)
MCH: 31.4 pg (ref 28.0–33.4)
MCHC: 33.7 g/dL (ref 32.0–35.9)
MCV: 93 fL (ref 82–98)
MONO#: 0.5 10*3/uL (ref 0.1–0.9)
MONO%: 8.4 % (ref 0.0–13.0)
NEUT#: 3.3 10*3/uL (ref 1.5–6.5)
NEUT%: 51.9 % (ref 40.0–80.0)
Platelets: 177 10*3/uL (ref 145–400)
RBC: 3.7 10*6/uL — ABNORMAL LOW (ref 4.20–5.70)
RDW: 13.7 % (ref 11.1–15.7)
WBC: 6.3 10*3/uL (ref 4.0–10.0)

## 2011-03-03 LAB — KAPPA/LAMBDA LIGHT CHAINS
Kappa free light chain: 2.04 mg/dL — ABNORMAL HIGH (ref 0.33–1.94)
Kappa:Lambda Ratio: 1.2 (ref 0.26–1.65)
Lambda Free Lght Chn: 1.7 mg/dL (ref 0.57–2.63)

## 2011-03-03 LAB — SPEP & IFE WITH QIG
Albumin ELP: 57.7 % (ref 55.8–66.1)
Alpha-1-Globulin: 4.1 % (ref 2.9–4.9)
Alpha-2-Globulin: 10.4 % (ref 7.1–11.8)
Beta 2: 4.7 % (ref 3.2–6.5)
Beta Globulin: 5.7 % (ref 4.7–7.2)
Gamma Globulin: 17.4 % (ref 11.1–18.8)
IgA: 139 mg/dL (ref 68–379)
IgG (Immunoglobin G), Serum: 1260 mg/dL (ref 650–1600)
IgM, Serum: 219 mg/dL (ref 41–251)
Total Protein, Serum Electrophoresis: 7.3 g/dL (ref 6.0–8.3)

## 2011-03-03 LAB — COMPREHENSIVE METABOLIC PANEL
ALT: 14 U/L (ref 0–53)
AST: 23 U/L (ref 0–37)
Albumin: 4.4 g/dL (ref 3.5–5.2)
Alkaline Phosphatase: 57 U/L (ref 39–117)
BUN: 17 mg/dL (ref 6–23)
CO2: 27 mEq/L (ref 19–32)
Calcium: 10.1 mg/dL (ref 8.4–10.5)
Chloride: 102 mEq/L (ref 96–112)
Creatinine, Ser: 1.19 mg/dL (ref 0.50–1.35)
Glucose, Bld: 98 mg/dL (ref 70–99)
Potassium: 4 mEq/L (ref 3.5–5.3)
Sodium: 137 mEq/L (ref 135–145)
Total Bilirubin: 0.9 mg/dL (ref 0.3–1.2)
Total Protein: 7.3 g/dL (ref 6.0–8.3)

## 2011-03-03 LAB — LACTATE DEHYDROGENASE: LDH: 182 U/L (ref 94–250)

## 2011-07-02 ENCOUNTER — Ambulatory Visit (HOSPITAL_BASED_OUTPATIENT_CLINIC_OR_DEPARTMENT_OTHER): Payer: Medicare Other | Admitting: Hematology & Oncology

## 2011-07-02 ENCOUNTER — Other Ambulatory Visit (HOSPITAL_BASED_OUTPATIENT_CLINIC_OR_DEPARTMENT_OTHER): Payer: Medicare Other | Admitting: Lab

## 2011-07-02 ENCOUNTER — Ambulatory Visit (HOSPITAL_BASED_OUTPATIENT_CLINIC_OR_DEPARTMENT_OTHER): Payer: Medicare Other

## 2011-07-02 DIAGNOSIS — C9000 Multiple myeloma not having achieved remission: Secondary | ICD-10-CM

## 2011-07-02 DIAGNOSIS — I10 Essential (primary) hypertension: Secondary | ICD-10-CM

## 2011-07-02 HISTORY — DX: Multiple myeloma not having achieved remission: C90.00

## 2011-07-02 LAB — CBC WITH DIFFERENTIAL (CANCER CENTER ONLY)
BASO#: 0 10*3/uL (ref 0.0–0.2)
BASO%: 0.3 % (ref 0.0–2.0)
EOS%: 3.6 % (ref 0.0–7.0)
Eosinophils Absolute: 0.3 10*3/uL (ref 0.0–0.5)
HCT: 37.5 % — ABNORMAL LOW (ref 38.7–49.9)
HGB: 12.3 g/dL — ABNORMAL LOW (ref 13.0–17.1)
LYMPH#: 3 10*3/uL (ref 0.9–3.3)
LYMPH%: 39.3 % (ref 14.0–48.0)
MCH: 30.4 pg (ref 28.0–33.4)
MCHC: 32.8 g/dL (ref 32.0–35.9)
MCV: 93 fL (ref 82–98)
MONO#: 0.6 10*3/uL (ref 0.1–0.9)
MONO%: 8.5 % (ref 0.0–13.0)
NEUT#: 3.7 10*3/uL (ref 1.5–6.5)
NEUT%: 48.3 % (ref 40.0–80.0)
Platelets: 201 10*3/uL (ref 145–400)
RBC: 4.04 10*6/uL — ABNORMAL LOW (ref 4.20–5.70)
RDW: 13.9 % (ref 11.1–15.7)
WBC: 7.5 10*3/uL (ref 4.0–10.0)

## 2011-07-02 MED ORDER — ZOLEDRONIC ACID 4 MG/5ML IV CONC
4.0000 mg | Freq: Once | INTRAVENOUS | Status: AC
  Administered 2011-07-02: 4 mg via INTRAVENOUS
  Filled 2011-07-02: qty 5

## 2011-07-02 NOTE — Progress Notes (Signed)
This office note has been dictated.

## 2011-07-03 NOTE — Progress Notes (Signed)
CC:   Dr. Leonides Cave, [phone] Verita Schneiders, MD  DIAGNOSIS:  IgG kappa myeloma.  CURRENT THERAPY:  Zometa 4 mg IV every 4 months.  INTERIM HISTORY:  Mr. Broad comes in for his follow-up.  He is doing okay. Unfortunately, he still has not lost weight.  He is not exercising as he should.  His blood pressure has been up.  When we last saw him, he had a negative monoclonal spike in his serum. His IgG level was 1260 mg/dL.  His serum kappa light chain was 2.04 mg/dL.  He apparently did go see Dr. Jacqulyn Bath at [facility].  He had lab work done there.  His lab work there pretty much corroborated our lab work with respect to not finding any "active" myeloma.  His appetite is okay.  He has had no change in bowel or bladder habits. He has had no rashes.  There has been no leg swelling.  PHYSICAL EXAMINATION:  General Appearance:  This is a well-developed, well-nourished black gentleman in no obvious distress.  Vital Signs: 97, pulse 57, respiratory rate 18, blood pressure 174/94.  Weight is 219.  Head and Neck Exam:  Shows a normocephalic, atraumatic skull. There are no ocular or oral lesions.  There are no palpable cervical or supraclavicular lymph nodes.  Lungs:  Clear to percussion and auscultation bilaterally.  Cardiac Exam:  Regular rate and rhythm with a normal S1 and S2.  There are no murmurs, rubs or bruits.  Abdominal Exam:  Soft with good bowel sounds.  There is no palpable abdominal mass.  There is no fluid wave.  There is no palpable hepatosplenomegaly. Extremities:  Show no clubbing, cyanosis or edema.  Neurological Exam: Shows no focal neurological deficits.  LABORATORY STUDIES:  White cell count is 7.5, hemoglobin 12.3, hematocrit 37.5, platelet count 201.  IMPRESSION:  Robert [name] is a 78 year old African American gentleman with a history of IgG kappa myeloma.  He continues to do quite well.  He did undergo a transplant at [facility].  He had this back in December 2006.  Again, I  do not see any evidence of recurrent myeloma to date.  I believe that the Zometa is providing some help for him.  We will get him back in 4 more months as we have been doing.    ______________________________ Robert [name], M.D. PRE/MEDQ  D:  07/02/2011  T:  07/03/2011  Job:  [id number]  ADDENDUM:  (-) M-Spike.  IgG is 1440 mg/dL.  KAPPA is 2.20 mg/dL.

## 2011-07-06 LAB — KAPPA/LAMBDA LIGHT CHAINS
Kappa free light chain: 2.2 mg/dL — ABNORMAL HIGH (ref 0.33–1.94)
Kappa:Lambda Ratio: 1.26 (ref 0.26–1.65)
Lambda Free Lght Chn: 1.74 mg/dL (ref 0.57–2.63)

## 2011-07-06 LAB — SPEP & IFE WITH QIG
Albumin ELP: 57.2 % (ref 55.8–66.1)
Alpha-1-Globulin: 4.2 % (ref 2.9–4.9)
Alpha-2-Globulin: 10.1 % (ref 7.1–11.8)
Beta 2: 4.6 % (ref 3.2–6.5)
Beta Globulin: 6.1 % (ref 4.7–7.2)
Gamma Globulin: 17.8 % (ref 11.1–18.8)
IgA: 143 mg/dL (ref 68–379)
IgG (Immunoglobin G), Serum: 1440 mg/dL (ref 650–1600)
IgM, Serum: 221 mg/dL (ref 41–251)
Total Protein, Serum Electrophoresis: 7.3 g/dL (ref 6.0–8.3)

## 2011-07-06 LAB — COMPREHENSIVE METABOLIC PANEL
ALT: 16 U/L (ref 0–53)
AST: 25 U/L (ref 0–37)
Albumin: 4.4 g/dL (ref 3.5–5.2)
Alkaline Phosphatase: 52 U/L (ref 39–117)
BUN: 17 mg/dL (ref 6–23)
CO2: 26 mEq/L (ref 19–32)
Calcium: 9.9 mg/dL (ref 8.4–10.5)
Chloride: 100 mEq/L (ref 96–112)
Creatinine, Ser: 1.21 mg/dL (ref 0.50–1.35)
Glucose, Bld: 85 mg/dL (ref 70–99)
Potassium: 3.7 mEq/L (ref 3.5–5.3)
Sodium: 137 mEq/L (ref 135–145)
Total Bilirubin: 0.8 mg/dL (ref 0.3–1.2)
Total Protein: 7.3 g/dL (ref 6.0–8.3)

## 2011-07-06 LAB — IRON AND TIBC
%SAT: 19 % — ABNORMAL LOW (ref 20–55)
Iron: 70 ug/dL (ref 42–165)
TIBC: 364 ug/dL (ref 215–435)
UIBC: 294 ug/dL (ref 125–400)

## 2011-07-06 LAB — LACTATE DEHYDROGENASE: LDH: 185 U/L (ref 94–250)

## 2011-07-06 LAB — ERYTHROPOIETIN: Erythropoietin: 18.7 m[IU]/mL (ref 2.6–34.0)

## 2011-07-06 LAB — FERRITIN: Ferritin: 161 ng/mL (ref 22–322)

## 2011-07-14 ENCOUNTER — Encounter: Payer: Self-pay | Admitting: *Deleted

## 2011-07-14 NOTE — Progress Notes (Signed)
Cal and tell him no myeloma,but that he HAS to lose weight!!! Pete  Left this message on pt's home answering machine.

## 2011-07-20 ENCOUNTER — Telehealth: Payer: Self-pay | Admitting: *Deleted

## 2011-07-20 NOTE — Telephone Encounter (Signed)
Pt called stating he was left a message on his home phone. Reviewed lab results from 07/02/11. Dr Myna Hidalgo stated "no myeloma but that he HAS to lose weight!!!" Pt verbalized understanding and stated he would work on losing the weight.

## 2011-10-22 ENCOUNTER — Other Ambulatory Visit (HOSPITAL_BASED_OUTPATIENT_CLINIC_OR_DEPARTMENT_OTHER): Payer: Medicare Other | Admitting: Lab

## 2011-10-22 DIAGNOSIS — C9000 Multiple myeloma not having achieved remission: Secondary | ICD-10-CM

## 2011-10-22 LAB — CBC WITH DIFFERENTIAL (CANCER CENTER ONLY)
BASO#: 0 10*3/uL (ref 0.0–0.2)
BASO%: 0.2 % (ref 0.0–2.0)
EOS%: 2.2 % (ref 0.0–7.0)
Eosinophils Absolute: 0.1 10*3/uL (ref 0.0–0.5)
HCT: 36.7 % — ABNORMAL LOW (ref 38.7–49.9)
HGB: 12.1 g/dL — ABNORMAL LOW (ref 13.0–17.1)
LYMPH#: 2.5 10*3/uL (ref 0.9–3.3)
LYMPH%: 42.5 % (ref 14.0–48.0)
MCH: 30.9 pg (ref 28.0–33.4)
MCHC: 33 g/dL (ref 32.0–35.9)
MCV: 94 fL (ref 82–98)
MONO#: 0.5 10*3/uL (ref 0.1–0.9)
MONO%: 7.8 % (ref 0.0–13.0)
NEUT#: 2.8 10*3/uL (ref 1.5–6.5)
NEUT%: 47.3 % (ref 40.0–80.0)
Platelets: 172 10*3/uL (ref 145–400)
RBC: 3.92 10*6/uL — ABNORMAL LOW (ref 4.20–5.70)
RDW: 13.6 % (ref 11.1–15.7)
WBC: 5.9 10*3/uL (ref 4.0–10.0)

## 2011-10-26 LAB — COMPREHENSIVE METABOLIC PANEL
ALT: 16 U/L (ref 0–53)
AST: 19 U/L (ref 0–37)
Albumin: 4.3 g/dL (ref 3.5–5.2)
Alkaline Phosphatase: 51 U/L (ref 39–117)
BUN: 15 mg/dL (ref 6–23)
CO2: 27 mEq/L (ref 19–32)
Calcium: 9.8 mg/dL (ref 8.4–10.5)
Chloride: 102 mEq/L (ref 96–112)
Creatinine, Ser: 1.2 mg/dL (ref 0.50–1.35)
Glucose, Bld: 98 mg/dL (ref 70–99)
Potassium: 3.7 mEq/L (ref 3.5–5.3)
Sodium: 139 mEq/L (ref 135–145)
Total Bilirubin: 0.9 mg/dL (ref 0.3–1.2)
Total Protein: 6.8 g/dL (ref 6.0–8.3)

## 2011-10-26 LAB — PROTEIN ELECTROPHORESIS, SERUM, WITH REFLEX
Albumin ELP: 56.5 % (ref 55.8–66.1)
Alpha-1-Globulin: 4.4 % (ref 2.9–4.9)
Alpha-2-Globulin: 10.1 % (ref 7.1–11.8)
Beta 2: 4.9 % (ref 3.2–6.5)
Beta Globulin: 5.8 % (ref 4.7–7.2)
Gamma Globulin: 18.3 % (ref 11.1–18.8)
Total Protein, Serum Electrophoresis: 6.8 g/dL (ref 6.0–8.3)

## 2011-10-26 LAB — IGG, IGA, IGM
IgA: 140 mg/dL (ref 68–379)
IgG (Immunoglobin G), Serum: 1380 mg/dL (ref 650–1600)
IgM, Serum: 213 mg/dL (ref 41–251)

## 2011-10-26 LAB — IFE INTERPRETATION

## 2011-10-26 LAB — KAPPA/LAMBDA LIGHT CHAINS
Kappa free light chain: 2.01 mg/dL — ABNORMAL HIGH (ref 0.33–1.94)
Kappa:Lambda Ratio: 1.23 (ref 0.26–1.65)
Lambda Free Lght Chn: 1.64 mg/dL (ref 0.57–2.63)

## 2011-10-29 ENCOUNTER — Ambulatory Visit (HOSPITAL_BASED_OUTPATIENT_CLINIC_OR_DEPARTMENT_OTHER): Payer: Medicare Other

## 2011-10-29 ENCOUNTER — Ambulatory Visit (HOSPITAL_BASED_OUTPATIENT_CLINIC_OR_DEPARTMENT_OTHER): Payer: Medicare Other | Admitting: Hematology & Oncology

## 2011-10-29 ENCOUNTER — Other Ambulatory Visit: Payer: Medicare Other | Admitting: Lab

## 2011-10-29 VITALS — BP 184/91 | HR 69 | Temp 97.2°F | Ht 70.0 in | Wt 216.0 lb

## 2011-10-29 DIAGNOSIS — C9000 Multiple myeloma not having achieved remission: Secondary | ICD-10-CM

## 2011-10-29 DIAGNOSIS — C9001 Multiple myeloma in remission: Secondary | ICD-10-CM

## 2011-10-29 MED ORDER — ZOLEDRONIC ACID 4 MG/100ML IV SOLN
4.0000 mg | Freq: Once | INTRAVENOUS | Status: AC
  Administered 2011-10-29: 4 mg via INTRAVENOUS
  Filled 2011-10-29: qty 100

## 2011-10-29 NOTE — Progress Notes (Signed)
This office note has been dictated.

## 2011-10-29 NOTE — Patient Instructions (Signed)
Zoledronic Acid injection (Hypercalcemia, Oncology) What is this medicine? ZOLEDRONIC ACID (ZOE le dron ik AS id) lowers the amount of calcium loss from bone. It is used to treat too much calcium in your blood from cancer. It is also used to prevent complications of cancer that has spread to the bone. This medicine may be used for other purposes; ask your health care provider or pharmacist if you have questions. What should I tell my health care provider before I take this medicine? They need to know if you have any of these conditions: -aspirin-sensitive asthma -dental disease -kidney disease -an unusual or allergic reaction to zoledronic acid, other medicines, foods, dyes, or preservatives -pregnant or trying to get pregnant -breast-feeding How should I use this medicine? This medicine is for infusion into a vein. It is given by a health care professional in a hospital or clinic setting. Talk to your pediatrician regarding the use of this medicine in children. Special care may be needed. Overdosage: If you think you have taken too much of this medicine contact a poison control center or emergency room at once. NOTE: This medicine is only for you. Do not share this medicine with others. What if I miss a dose? It is important not to miss your dose. Call your doctor or health care professional if you are unable to keep an appointment. What may interact with this medicine? -certain antibiotics given by injection -NSAIDs, medicines for pain and inflammation, like ibuprofen or naproxen -some diuretics like bumetanide, furosemide -teriparatide -thalidomide This list may not describe all possible interactions. Give your health care provider a list of all the medicines, herbs, non-prescription drugs, or dietary supplements you use. Also tell them if you smoke, drink alcohol, or use illegal drugs. Some items may interact with your medicine. What should I watch for while using this medicine? Visit  your doctor or health care professional for regular checkups. It may be some time before you see the benefit from this medicine. Do not stop taking your medicine unless your doctor tells you to. Your doctor may order blood tests or other tests to see how you are doing. Women should inform their doctor if they wish to become pregnant or think they might be pregnant. There is a potential for serious side effects to an unborn child. Talk to your health care professional or pharmacist for more information. You should make sure that you get enough calcium and vitamin D while you are taking this medicine. Discuss the foods you eat and the vitamins you take with your health care professional. Some people who take this medicine have severe bone, joint, and/or muscle pain. This medicine may also increase your risk for a broken thigh bone. Tell your doctor right away if you have pain in your upper leg or groin. Tell your doctor if you have any pain that does not go away or that gets worse. What side effects may I notice from receiving this medicine? Side effects that you should report to your doctor or health care professional as soon as possible: -allergic reactions like skin rash, itching or hives, swelling of the face, lips, or tongue -anxiety, confusion, or depression -breathing problems -changes in vision -feeling faint or lightheaded, falls -jaw burning, cramping, pain -muscle cramps, stiffness, or weakness -trouble passing urine or change in the amount of urine Side effects that usually do not require medical attention (report to your doctor or health care professional if they continue or are bothersome): -bone, joint, or muscle pain -  fever -hair loss -irritation at site where injected -loss of appetite -nausea, vomiting -stomach upset -tired This list may not describe all possible side effects. Call your doctor for medical advice about side effects. You may report side effects to FDA at  1-800-FDA-1088. Where should I keep my medicine? This drug is given in a hospital or clinic and will not be stored at home. NOTE: This sheet is a summary. It may not cover all possible information. If you have questions about this medicine, talk to your doctor, pharmacist, or health care provider.  2012, Elsevier/Gold Standard. (11/07/2010 9:06:58 AM) 

## 2011-11-02 NOTE — Progress Notes (Signed)
CC:   Verita Schneiders, MD Alta Corning, MD, Fax [phone]  DIAGNOSIS:  IgG kappa myeloma-remission.  CURRENT THERAPY:  Zometa 4 mg IV q.4 months.  INTERVAL HISTORY:  [name] comes in for his followup.  He is doing quite well.  He is trying to exercise a little more.  He sees Dr. Daleen Squibb in [place] in a few weeks for his physical.  He did see Dr. Verita Schneiders at [facility].  I think this was back in January or February.  Everything looked fine there.  He does not have any plans for the summer.  Hopefully, he will continue to exercise.  PHYSICAL EXAM:  General: This is a mildly obese, black gentleman in no obvious distress.  Vital Signs:  Show temperature 97, pulse 69, respiratory 18, blood pressure 184/91.  Weight is 216.  Head and neck: Shows a normocephalic, atraumatic skull.  There are no ocular or oral lesions.  There are no palpable cervical or supraclavicular lymph nodes. Lungs:  Clear to percussion and auscultation bilaterally.  Cardiac: Regular rate and rhythm with a normal S1 and S2.  There are no murmurs, rubs or bruits.  Abdominal:  Soft with good bowel sounds.  There is no palpable abdominal mass.  There is no fluid wave.  There is no palpable hepatosplenomegaly.  Back exam:  No tenderness over the spine, ribs, or hips.  Extremities:  Show no clubbing, cyanosis or edema.  Neurological: Shows no focal neurological deficits.  LABORATORIES:  White cell count is 5.9, hemoglobin 12.1, hematocrit 36.7, platelet count 172.  His monoclonal panel shows a negative M spike.  His IgG level was 1280 mg/dL.  His kappa light chain is 2.01 mg/dL.  IMPRESSION:  [name] is a 78 year old, African gentleman with IgG kappa myeloma.  He underwent stem cell transplant at [facility] in December 2006. He is in remission right now.  I see no evidence of recurrence.  We will go ahead and plan for Zometa every 4 months.  I do believe that on some level, Zometa might be helping him.  I will see back in 4  months with his Zometa therapy.    ______________________________ Josph Macho, M.D. PRE/MEDQ  D:  10/29/2011  T:  10/30/2011  Job:  [id number]

## 2012-02-12 ENCOUNTER — Other Ambulatory Visit (HOSPITAL_BASED_OUTPATIENT_CLINIC_OR_DEPARTMENT_OTHER): Payer: Medicare Other | Admitting: Lab

## 2012-02-12 DIAGNOSIS — C9000 Multiple myeloma not having achieved remission: Secondary | ICD-10-CM

## 2012-02-12 LAB — CBC WITH DIFFERENTIAL (CANCER CENTER ONLY)
BASO#: 0 10*3/uL (ref 0.0–0.2)
BASO%: 0.2 % (ref 0.0–2.0)
EOS%: 2.3 % (ref 0.0–7.0)
Eosinophils Absolute: 0.1 10*3/uL (ref 0.0–0.5)
HCT: 36.9 % — ABNORMAL LOW (ref 38.7–49.9)
HGB: 12.3 g/dL — ABNORMAL LOW (ref 13.0–17.1)
LYMPH#: 2.1 10*3/uL (ref 0.9–3.3)
LYMPH%: 40 % (ref 14.0–48.0)
MCH: 31.3 pg (ref 28.0–33.4)
MCHC: 33.3 g/dL (ref 32.0–35.9)
MCV: 94 fL (ref 82–98)
MONO#: 0.4 10*3/uL (ref 0.1–0.9)
MONO%: 7.4 % (ref 0.0–13.0)
NEUT#: 2.7 10*3/uL (ref 1.5–6.5)
NEUT%: 50.1 % (ref 40.0–80.0)
Platelets: 175 10*3/uL (ref 145–400)
RBC: 3.93 10*6/uL — ABNORMAL LOW (ref 4.20–5.70)
RDW: 13.3 % (ref 11.1–15.7)
WBC: 5.3 10*3/uL (ref 4.0–10.0)

## 2012-02-16 LAB — IGG, IGA, IGM
IgA: 145 mg/dL (ref 68–379)
IgG (Immunoglobin G), Serum: 1240 mg/dL (ref 650–1600)
IgM, Serum: 211 mg/dL (ref 41–251)

## 2012-02-16 LAB — COMPREHENSIVE METABOLIC PANEL
ALT: 16 U/L (ref 0–53)
AST: 20 U/L (ref 0–37)
Albumin: 4.2 g/dL (ref 3.5–5.2)
Alkaline Phosphatase: 48 U/L (ref 39–117)
BUN: 18 mg/dL (ref 6–23)
CO2: 27 mEq/L (ref 19–32)
Calcium: 9.7 mg/dL (ref 8.4–10.5)
Chloride: 101 mEq/L (ref 96–112)
Creatinine, Ser: 1.09 mg/dL (ref 0.50–1.35)
Glucose, Bld: 93 mg/dL (ref 70–99)
Potassium: 4.2 mEq/L (ref 3.5–5.3)
Sodium: 138 mEq/L (ref 135–145)
Total Bilirubin: 0.9 mg/dL (ref 0.3–1.2)
Total Protein: 7.2 g/dL (ref 6.0–8.3)

## 2012-02-16 LAB — PROTEIN ELECTROPHORESIS, SERUM, WITH REFLEX
Albumin ELP: 57.5 % (ref 55.8–66.1)
Alpha-1-Globulin: 3.7 % (ref 2.9–4.9)
Alpha-2-Globulin: 9.6 % (ref 7.1–11.8)
Beta 2: 4.9 % (ref 3.2–6.5)
Beta Globulin: 6 % (ref 4.7–7.2)
Gamma Globulin: 18.3 % (ref 11.1–18.8)
Total Protein, Serum Electrophoresis: 7.2 g/dL (ref 6.0–8.3)

## 2012-02-16 LAB — IFE INTERPRETATION

## 2012-02-16 LAB — KAPPA/LAMBDA LIGHT CHAINS
Kappa free light chain: 1.59 mg/dL (ref 0.33–1.94)
Kappa:Lambda Ratio: 1.06 (ref 0.26–1.65)
Lambda Free Lght Chn: 1.5 mg/dL (ref 0.57–2.63)

## 2012-02-17 ENCOUNTER — Telehealth: Payer: Self-pay | Admitting: *Deleted

## 2012-02-17 NOTE — Telephone Encounter (Signed)
Message copied by Anselm Jungling on Wed Feb 17, 2012 10:58 AM ------      Message from: Arlan Organ R      Created: Mon Feb 15, 2012  3:35 PM       call - still no active myeloma!!  Cindee Lame

## 2012-02-17 NOTE — Telephone Encounter (Signed)
Called patient to let him know that his labwork showed no active myeloma per dr. Myna Hidalgo

## 2012-02-26 ENCOUNTER — Ambulatory Visit (HOSPITAL_BASED_OUTPATIENT_CLINIC_OR_DEPARTMENT_OTHER): Payer: Medicare Other | Admitting: Hematology & Oncology

## 2012-02-26 ENCOUNTER — Ambulatory Visit (HOSPITAL_BASED_OUTPATIENT_CLINIC_OR_DEPARTMENT_OTHER): Payer: Medicare Other

## 2012-02-26 VITALS — BP 158/96 | HR 59 | Temp 97.8°F | Resp 20 | Ht 70.0 in | Wt 218.0 lb

## 2012-02-26 DIAGNOSIS — C9000 Multiple myeloma not having achieved remission: Secondary | ICD-10-CM

## 2012-02-26 MED ORDER — SODIUM CHLORIDE 0.9 % IV SOLN
Freq: Once | INTRAVENOUS | Status: AC
  Administered 2012-02-26: 13:00:00 via INTRAVENOUS

## 2012-02-26 MED ORDER — ZOLEDRONIC ACID 4 MG/5ML IV CONC
4.0000 mg | Freq: Once | INTRAVENOUS | Status: AC
  Administered 2012-02-26: 4 mg via INTRAVENOUS
  Filled 2012-02-26: qty 5

## 2012-02-26 NOTE — Progress Notes (Signed)
This office note has been dictated.

## 2012-02-26 NOTE — Patient Instructions (Signed)
Zoledronic Acid injection (Hypercalcemia, Oncology) What is this medicine? ZOLEDRONIC ACID (ZOE le dron ik AS id) lowers the amount of calcium loss from bone. It is used to treat too much calcium in your blood from cancer. It is also used to prevent complications of cancer that has spread to the bone. This medicine may be used for other purposes; ask your health care provider or pharmacist if you have questions. What should I tell my health care provider before I take this medicine? They need to know if you have any of these conditions: -aspirin-sensitive asthma -dental disease -kidney disease -an unusual or allergic reaction to zoledronic acid, other medicines, foods, dyes, or preservatives -pregnant or trying to get pregnant -breast-feeding How should I use this medicine? This medicine is for infusion into a vein. It is given by a health care professional in a hospital or clinic setting. Talk to your pediatrician regarding the use of this medicine in children. Special care may be needed. Overdosage: If you think you have taken too much of this medicine contact a poison control center or emergency room at once. NOTE: This medicine is only for you. Do not share this medicine with others. What if I miss a dose? It is important not to miss your dose. Call your doctor or health care professional if you are unable to keep an appointment. What may interact with this medicine? -certain antibiotics given by injection -NSAIDs, medicines for pain and inflammation, like ibuprofen or naproxen -some diuretics like bumetanide, furosemide -teriparatide -thalidomide This list may not describe all possible interactions. Give your health care provider a list of all the medicines, herbs, non-prescription drugs, or dietary supplements you use. Also tell them if you smoke, drink alcohol, or use illegal drugs. Some items may interact with your medicine. What should I watch for while using this medicine? Visit  your doctor or health care professional for regular checkups. It may be some time before you see the benefit from this medicine. Do not stop taking your medicine unless your doctor tells you to. Your doctor may order blood tests or other tests to see how you are doing. Women should inform their doctor if they wish to become pregnant or think they might be pregnant. There is a potential for serious side effects to an unborn child. Talk to your health care professional or pharmacist for more information. You should make sure that you get enough calcium and vitamin D while you are taking this medicine. Discuss the foods you eat and the vitamins you take with your health care professional. Some people who take this medicine have severe bone, joint, and/or muscle pain. This medicine may also increase your risk for a broken thigh bone. Tell your doctor right away if you have pain in your upper leg or groin. Tell your doctor if you have any pain that does not go away or that gets worse. What side effects may I notice from receiving this medicine? Side effects that you should report to your doctor or health care professional as soon as possible: -allergic reactions like skin rash, itching or hives, swelling of the face, lips, or tongue -anxiety, confusion, or depression -breathing problems -changes in vision -feeling faint or lightheaded, falls -jaw burning, cramping, pain -muscle cramps, stiffness, or weakness -trouble passing urine or change in the amount of urine Side effects that usually do not require medical attention (report to your doctor or health care professional if they continue or are bothersome): -bone, joint, or muscle pain -  fever -hair loss -irritation at site where injected -loss of appetite -nausea, vomiting -stomach upset -tired This list may not describe all possible side effects. Call your doctor for medical advice about side effects. You may report side effects to FDA at  1-800-FDA-1088. Where should I keep my medicine? This drug is given in a hospital or clinic and will not be stored at home. NOTE: This sheet is a summary. It may not cover all possible information. If you have questions about this medicine, talk to your doctor, pharmacist, or health care provider.  2012, Elsevier/Gold Standard. (11/07/2010 9:06:58 AM) 

## 2012-02-26 NOTE — Patient Instructions (Signed)
Call if problems...  PLEASE LOSE WEIGHT!!!!

## 2012-02-29 NOTE — Progress Notes (Signed)
CC:   Alta Corning, MD, Fax [phone] Verita Schneiders, MD  DIAGNOSIS:  IgG kappa myeloma, clinical remission.  CURRENT THERAPY:  Zometa 4 mg IV every 4 months.  INTERIM HISTORY:  Mr. Beckerman comes in for his followup.  He is still doing okay.  Unfortunately, his weight is going up.  He is on blood pressure medicine now.  He said that Dr. Daleen Squibb put him on atenolol and losartan.  From what he tells me, I am not sure he is taking these everyday.  He is also on hydrochlorothiazide.  He had lab work done 2 weeks ago.  His lab work did not show any evidence of active myeloma.  There was no monoclonal spike in his serum. IgG level was 1240 mg/dL.  His kappa light chain was 1.59 mg/dL.  He has had no fever.  He has had no back pain.  He has had no leg swelling.  There has been no rash.  There has been no change in bowel or bladder habits.  PHYSICAL EXAMINATION:  General:  This is a well-developed, well- nourished black gentleman in no obvious distress.  Vital Signs:  Show a temperature of 97.8, pulse 59, respiratory rate 20, blood pressure 150/90, weight is 218.  Head and Neck Exam:  Shows a normocephalic, atraumatic skull.  There are no ocular or oral lesions.  There are no palpable cervical or supraclavicular lymph nodes.  Lungs:  Clear bilaterally.  Cardiac Exam:  Regular rate and rhythm with a normal S1 and S2.  There are no murmurs, rubs, or bruits.  Abdominal Exam:  Soft with good bowel sounds.  There is no palpable abdominal mass.  No palpable hepatosplenomegaly.  Back Exam:  No tenderness over the spine, ribs, or hips.  Extremities:  Show no clubbing, cyanosis, or edema. Neurological Exam:  Shows no focal neurological deficits.  LABORATORY STUDIES:  White cell count is 5.3, hemoglobin 12.3, hematocrit 37, platelet count 135.  His BUN and creatinine are 18 and 1.09.  Calcium is 9.7 with albumin of 4.2.  IMPRESSION:  Mr. Alemu is a 78 year old gentleman with a history of IgG kappa  myeloma.  He underwent stem cell transplant at [facility] back in December 2006.  He is doing well right now.  We will continue the Zometa.  I truly believe that Zometa is providing some benefit for [place].  We will get him back in 4 more months for his Zometa.  I keep getting on him about his weight.  I just hope that he will be begin to lose a little weight.    ______________________________ Josph Macho, M.D. PRE/MEDQ  D:  02/26/2012  T:  02/27/2012  Job:  [id number]

## 2012-04-14 ENCOUNTER — Emergency Department (HOSPITAL_COMMUNITY): Payer: Medicare Other

## 2012-04-14 ENCOUNTER — Emergency Department (HOSPITAL_COMMUNITY)
Admission: EM | Admit: 2012-04-14 | Discharge: 2012-04-14 | Disposition: A | Discharge status: Home / Self Care | Payer: Medicare Other | Attending: Emergency Medicine | Admitting: Emergency Medicine

## 2012-04-14 ENCOUNTER — Encounter (HOSPITAL_COMMUNITY): Payer: Self-pay | Admitting: Emergency Medicine

## 2012-04-14 DIAGNOSIS — Z79899 Other long term (current) drug therapy: Secondary | ICD-10-CM | POA: Insufficient documentation

## 2012-04-14 DIAGNOSIS — Z8589 Personal history of malignant neoplasm of other organs and systems: Secondary | ICD-10-CM | POA: Insufficient documentation

## 2012-04-14 DIAGNOSIS — E871 Hypo-osmolality and hyponatremia: Secondary | ICD-10-CM | POA: Insufficient documentation

## 2012-04-14 DIAGNOSIS — K3189 Other diseases of stomach and duodenum: Secondary | ICD-10-CM | POA: Insufficient documentation

## 2012-04-14 DIAGNOSIS — K5900 Constipation, unspecified: Secondary | ICD-10-CM | POA: Insufficient documentation

## 2012-04-14 DIAGNOSIS — Z87891 Personal history of nicotine dependence: Secondary | ICD-10-CM | POA: Insufficient documentation

## 2012-04-14 DIAGNOSIS — Z7982 Long term (current) use of aspirin: Secondary | ICD-10-CM | POA: Insufficient documentation

## 2012-04-14 DIAGNOSIS — R1013 Epigastric pain: Secondary | ICD-10-CM | POA: Insufficient documentation

## 2012-04-14 HISTORY — DX: Multiple myeloma not having achieved remission: C90.00

## 2012-04-14 LAB — CBC WITH DIFFERENTIAL/PLATELET
Basophils Absolute: 0 10*3/uL (ref 0.0–0.1)
Basophils Relative: 0 % (ref 0–1)
Eosinophils Absolute: 0.1 10*3/uL (ref 0.0–0.7)
Eosinophils Relative: 1 % (ref 0–5)
HCT: 34.6 % — ABNORMAL LOW (ref 39.0–52.0)
Hemoglobin: 11.9 g/dL — ABNORMAL LOW (ref 13.0–17.0)
Lymphocytes Relative: 38 % (ref 12–46)
Lymphs Abs: 2.6 10*3/uL (ref 0.7–4.0)
MCH: 30.7 pg (ref 26.0–34.0)
MCHC: 34.4 g/dL (ref 30.0–36.0)
MCV: 89.2 fL (ref 78.0–100.0)
Monocytes Absolute: 0.5 10*3/uL (ref 0.1–1.0)
Monocytes Relative: 8 % (ref 3–12)
Neutro Abs: 3.7 10*3/uL (ref 1.7–7.7)
Neutrophils Relative %: 54 % (ref 43–77)
Platelets: 237 10*3/uL (ref 150–400)
RBC: 3.88 MIL/uL — ABNORMAL LOW (ref 4.22–5.81)
RDW: 13.2 % (ref 11.5–15.5)
WBC: 7 10*3/uL (ref 4.0–10.5)

## 2012-04-14 LAB — POCT I-STAT TROPONIN I: Troponin i, poc: 0.01 ng/mL (ref 0.00–0.08)

## 2012-04-14 LAB — POCT I-STAT, CHEM 8
BUN: 9 mg/dL (ref 6–23)
Calcium, Ion: 1.21 mmol/L (ref 1.13–1.30)
Chloride: 94 mEq/L — ABNORMAL LOW (ref 96–112)
Creatinine, Ser: 0.9 mg/dL (ref 0.50–1.35)
Glucose, Bld: 106 mg/dL — ABNORMAL HIGH (ref 70–99)
HCT: 33 % — ABNORMAL LOW (ref 39.0–52.0)
Hemoglobin: 11.2 g/dL — ABNORMAL LOW (ref 13.0–17.0)
Potassium: 3.3 mEq/L — ABNORMAL LOW (ref 3.5–5.1)
Sodium: 129 mEq/L — ABNORMAL LOW (ref 135–145)
TCO2: 25 mmol/L (ref 0–100)

## 2012-04-14 LAB — COMPREHENSIVE METABOLIC PANEL
ALT: 12 U/L (ref 0–53)
AST: 18 U/L (ref 0–37)
Albumin: 3.8 g/dL (ref 3.5–5.2)
Alkaline Phosphatase: 57 U/L (ref 39–117)
BUN: 10 mg/dL (ref 6–23)
CO2: 27 mEq/L (ref 19–32)
Calcium: 9.9 mg/dL (ref 8.4–10.5)
Chloride: 89 mEq/L — ABNORMAL LOW (ref 96–112)
Creatinine, Ser: 0.94 mg/dL (ref 0.50–1.35)
GFR calc Af Amer: >90 mL/min (ref >90)
GFR calc non Af Amer: 79 mL/min — ABNORMAL LOW (ref >90)
Glucose, Bld: 111 mg/dL — ABNORMAL HIGH (ref 70–99)
Potassium: 3.3 mEq/L — ABNORMAL LOW (ref 3.5–5.1)
Sodium: 127 mEq/L — ABNORMAL LOW (ref 135–145)
Total Bilirubin: 1.1 mg/dL (ref 0.3–1.2)
Total Protein: 7.8 g/dL (ref 6.0–8.3)

## 2012-04-14 LAB — LIPASE, BLOOD: Lipase: 24 U/L (ref 11–59)

## 2012-04-14 MED ORDER — GI COCKTAIL ~~LOC~~
30.0000 mL | Freq: Once | ORAL | Status: AC
  Administered 2012-04-14: 30 mL via ORAL
  Filled 2012-04-14: qty 30

## 2012-04-14 MED ORDER — PANTOPRAZOLE SODIUM 20 MG PO TBEC: 40.0000 mg | DELAYED_RELEASE_TABLET | Freq: Every day | ORAL | Status: DC

## 2012-04-14 MED ORDER — POLYETHYLENE GLYCOL 3350 17 G PO PACK: 17.0000 g | PACK | Freq: Every day | ORAL | Status: DC

## 2012-04-14 NOTE — ED Notes (Signed)
Pt c/o abdominal and chest pressure that started approx. 3 days ago with no relief from pepto bismol or tums. Abd pain started on R flank and moved around to RUQ. Feels like it would feel better with burping.

## 2012-04-14 NOTE — ED Provider Notes (Signed)
History     CSN: [id number]  Arrival date & time 04/14/12  3086   First MD Initiated Contact with Patient 04/14/12 0118      Chief Complaint  Patient presents with  . Chest Pain  . Abdominal Pain    (Consider location/radiation/quality/duration/timing/severity/associated sxs/prior treatment) HPI 78 yo male presents to the ER with complaint of abd and chest pressure with belching for the last 3 days.  Pt reports initially pain on right side of abd, now has moved to epigastrium/under right costal margin, some radiation into right chest.  No fever, no n/v/d, no cough.  BM yesterday, normal.  Pt c/o pressure worse at night when lying down, and feeling that he has to burp repeatedly to help with pressure.  PT with h/o mult myeloma s/p stem cell transplant.  He denies CAD, smoking, chest pain, sob, FH of CAD. He does have h/o htn, hyperlipidemia.    Past Medical History  Diagnosis Date  . Multiple myeloma     2006    Past Surgical History  Procedure Date  . Limbal stem cell transplant     No family history on file.  History  Substance Use Topics  . Smoking status: Former Games developer  . Smokeless tobacco: Not on file  . Alcohol Use: Yes      Review of Systems  All other systems reviewed and are negative.   Negative other than listed in HPI  Allergies  Review of patient's allergies indicates no known allergies.  Home Medications   Current Outpatient Rx  Name  Route  Sig  Dispense  Refill  . ASPIRIN EC 81 MG PO TBEC   Oral   Take 81 mg by mouth daily.         . ATENOLOL 50 MG PO TABS   Oral   Take 50 mg by mouth daily.         Marland Kitchen BIMATOPROST 0.03 % OP SOLN   Both Eyes   Place 1 drop into both eyes at bedtime.         Marland Kitchen CALTRATE 600+D PLUS PO   Oral   Take 1 tablet by mouth daily.         Marland Kitchen VITAMIN D3 1000 UNITS PO CAPS   Oral   Take 1 capsule by mouth daily.         . CYANOCOBALAMIN 2000 MCG PO TABS   Oral   Take 2,000 mcg by mouth daily.        Marland Kitchen HYDROCHLOROTHIAZIDE 25 MG PO TABS   Oral   Take 25 mg by mouth daily.         Marland Kitchen LOSARTAN POTASSIUM 50 MG PO TABS   Oral   Take 50 mg by mouth daily.         . ADULT MULTIVITAMIN W/MINERALS CH   Oral   Take 1 tablet by mouth daily.         Marland Kitchen POTASSIUM CHLORIDE 20 MEQ PO PACK   Oral   Take 20 mEq by mouth daily.         Marland Kitchen PRAVASTATIN SODIUM 20 MG PO TABS   Oral   Take 20 mg by mouth at bedtime.         Marland Kitchen PANTOPRAZOLE SODIUM 20 MG PO TBEC   Oral   Take 2 tablets (40 mg total) by mouth daily.   30 tablet   0   . POLYETHYLENE GLYCOL 3350 PO PACK   Oral   Take 17  g by mouth daily.   14 each   0   . ZOLEDRONIC ACID 4 MG/100ML IV SOLN   Intravenous   Inject 4 mg into the vein. Every 4 months.           BP 174/81  Pulse 49  Temp 98.2 F (36.8 C) (Oral)  Resp 16  SpO2 100%  Physical Exam  Nursing note and vitals reviewed. Constitutional: He is oriented to person, place, and time. He appears well-developed and well-nourished.  HENT:  Head: Normocephalic and atraumatic.  Nose: Nose normal.  Mouth/Throat: Oropharynx is clear and moist.  Eyes: Conjunctivae normal and EOM are normal. Pupils are equal, round, and reactive to light.  Neck: Normal range of motion. Neck supple. No JVD present. No tracheal deviation present. No thyromegaly present.  Cardiovascular: Normal rate, regular rhythm, normal heart sounds and intact distal pulses.  Exam reveals no gallop and no friction rub.   No murmur heard. Pulmonary/Chest: Effort normal and breath sounds normal. No stridor. No respiratory distress. He has no wheezes. He has no rales. He exhibits no tenderness.  Abdominal: Soft. Bowel sounds are normal. He exhibits no distension and no mass. There is no tenderness. There is no rebound and no guarding.  Musculoskeletal: Normal range of motion. He exhibits no edema and no tenderness.  Lymphadenopathy:    He has no cervical adenopathy.  Neurological: He is alert and  oriented to person, place, and time. He exhibits normal muscle tone. Coordination normal.  Skin: Skin is dry. No rash noted. No erythema. No pallor.  Psychiatric: He has a normal mood and affect. His behavior is normal. Judgment and thought content normal.    ED Course  Procedures (including critical care time)  Labs Reviewed  CBC WITH DIFFERENTIAL - Abnormal; Notable for the following:    RBC 3.88 (*)     Hemoglobin 11.9 (*)     HCT 34.6 (*)     All other components within normal limits  COMPREHENSIVE METABOLIC PANEL - Abnormal; Notable for the following:    Sodium 127 (*)     Potassium 3.3 (*)     Chloride 89 (*)     Glucose, Bld 111 (*)     GFR calc non Af Amer 79 (*)     All other components within normal limits  POCT I-STAT, CHEM 8 - Abnormal; Notable for the following:    Sodium 129 (*)     Potassium 3.3 (*)     Chloride 94 (*)     Glucose, Bld 106 (*)     Hemoglobin 11.2 (*)     HCT 33.0 (*)     All other components within normal limits  LIPASE, BLOOD  POCT I-STAT TROPONIN I   Dg Chest 2 View  04/14/2012  *RADIOLOGY REPORT*  Clinical Data: Epigastric pain  CHEST - 2 VIEW  Comparison: 03/18/2005  Findings: Mild aortic tortuosity and heart size upper normal to mildly enlarged, similar to prior. Calcified right hilar lymph node.  Mild central peribronchial thickening, similar to prior.  No confluent airspace opacity, pleural effusion, or pneumothorax.  No acute osseous finding.  Multilevel degenerative change.  IMPRESSION: Mild chronic bronchitic change without confluent airspace opacity.   Original Report Authenticated By: Jearld Lesch, M.D.    Dg Abd 2 Views  04/14/2012  *RADIOLOGY REPORT*  Clinical Data: Abdominal pain  ABDOMEN - 2 VIEW  Comparison: None.  Findings: Moderate stool burden.  Bowel gas pattern nonobstructive. No free intraperitoneal air.  Rightward curvature of the lumbar spine with multilevel degenerative change.  No acute osseous finding.  IMPRESSION:  Nonobstructive bowel gas pattern.   Original Report Authenticated By: Jearld Lesch, M.D.     Date: 04/14/2012  Rate: 55  Rhythm: sinus bradycardia  QRS Axis: normal  Intervals: PR prolonged  ST/T Wave abnormalities: normal  Conduction Disutrbances:first-degree A-V block   Narrative Interpretation:   Old EKG Reviewed: unchanged     1. Abdominal pain, epigastric   2. Constipation   3. Dyspepsia   4. Hyponatremia       MDM  78 year old male with 3 days of bloating, abdominal pressure relieved with burping and some radiation into his epigastrium. Workup unremarkable aside from constipation and hyponatremia. Patient has not had any weakness dizziness. He and his wife deny increased fluid intake, no history of hyponatremia in the past. He is asymptomatic. We'll have him limit free water and increase salt in diet. He is to followup with his primary care Dr. for recheck of his sodium. We'll start him on MiraLAX for his constipation        Olivia Mackie, MD 04/14/12 2043

## 2012-05-12 ENCOUNTER — Other Ambulatory Visit: Payer: Self-pay | Admitting: *Deleted

## 2012-06-24 ENCOUNTER — Other Ambulatory Visit (HOSPITAL_BASED_OUTPATIENT_CLINIC_OR_DEPARTMENT_OTHER): Payer: Medicare Other | Admitting: Lab

## 2012-06-24 DIAGNOSIS — C9000 Multiple myeloma not having achieved remission: Secondary | ICD-10-CM

## 2012-06-24 LAB — CBC WITH DIFFERENTIAL (CANCER CENTER ONLY)
BASO#: 0 10*3/uL (ref 0.0–0.2)
BASO%: 0.2 % (ref 0.0–2.0)
EOS%: 2.9 % (ref 0.0–7.0)
Eosinophils Absolute: 0.2 10*3/uL (ref 0.0–0.5)
HCT: 35.5 % — ABNORMAL LOW (ref 38.7–49.9)
HGB: 11.5 g/dL — ABNORMAL LOW (ref 13.0–17.1)
LYMPH#: 2 10*3/uL (ref 0.9–3.3)
LYMPH%: 37.3 % (ref 14.0–48.0)
MCH: 30.8 pg (ref 28.0–33.4)
MCHC: 32.4 g/dL (ref 32.0–35.9)
MCV: 95 fL (ref 82–98)
MONO#: 0.4 10*3/uL (ref 0.1–0.9)
MONO%: 7.6 % (ref 0.0–13.0)
NEUT#: 2.7 10*3/uL (ref 1.5–6.5)
NEUT%: 52 % (ref 40.0–80.0)
Platelets: 203 10*3/uL (ref 145–400)
RBC: 3.73 10*6/uL — ABNORMAL LOW (ref 4.20–5.70)
RDW: 14.1 % (ref 11.1–15.7)
WBC: 5.3 10*3/uL (ref 4.0–10.0)

## 2012-06-28 LAB — COMPREHENSIVE METABOLIC PANEL
ALT: 14 U/L (ref 0–53)
AST: 19 U/L (ref 0–37)
Albumin: 4.2 g/dL (ref 3.5–5.2)
Alkaline Phosphatase: 43 U/L (ref 39–117)
BUN: 14 mg/dL (ref 6–23)
CO2: 29 mEq/L (ref 19–32)
Calcium: 9.7 mg/dL (ref 8.4–10.5)
Chloride: 103 mEq/L (ref 96–112)
Creatinine, Ser: 1.14 mg/dL (ref 0.50–1.35)
Glucose, Bld: 97 mg/dL (ref 70–99)
Potassium: 3.5 mEq/L (ref 3.5–5.3)
Sodium: 138 mEq/L (ref 135–145)
Total Bilirubin: 0.8 mg/dL (ref 0.3–1.2)
Total Protein: 6.5 g/dL (ref 6.0–8.3)

## 2012-06-28 LAB — IGG, IGA, IGM
IgA: 124 mg/dL (ref 68–379)
IgG (Immunoglobin G), Serum: 1170 mg/dL (ref 650–1600)
IgM, Serum: 180 mg/dL (ref 41–251)

## 2012-06-28 LAB — PROTEIN ELECTROPHORESIS, SERUM, WITH REFLEX
Albumin ELP: 58.9 % (ref 55.8–66.1)
Alpha-1-Globulin: 3.7 % (ref 2.9–4.9)
Alpha-2-Globulin: 9.7 % (ref 7.1–11.8)
Beta 2: 4.4 % (ref 3.2–6.5)
Beta Globulin: 5.9 % (ref 4.7–7.2)
Gamma Globulin: 17.4 % (ref 11.1–18.8)
Total Protein, Serum Electrophoresis: 6.5 g/dL (ref 6.0–8.3)

## 2012-06-28 LAB — KAPPA/LAMBDA LIGHT CHAINS
Kappa free light chain: 1.93 mg/dL (ref 0.33–1.94)
Kappa:Lambda Ratio: 1.36 (ref 0.26–1.65)
Lambda Free Lght Chn: 1.42 mg/dL (ref 0.57–2.63)

## 2012-06-29 ENCOUNTER — Telehealth: Payer: Self-pay | Admitting: *Deleted

## 2012-06-29 NOTE — Telephone Encounter (Signed)
Called patient to let him know that based on labwork there was no myeloma per dr. Myna Hidalgo.

## 2012-06-29 NOTE — Telephone Encounter (Signed)
Message copied by Anselm Jungling on Wed Jun 29, 2012  9:47 AM ------      Message from: Josph Macho      Created: Tue Jun 28, 2012  2:57 PM       Please call and let him know that there is no myeloma. Thanks. Cindee Lame

## 2012-07-01 ENCOUNTER — Ambulatory Visit (HOSPITAL_BASED_OUTPATIENT_CLINIC_OR_DEPARTMENT_OTHER): Payer: Medicare Other

## 2012-07-01 ENCOUNTER — Other Ambulatory Visit: Payer: Medicare Other | Admitting: Lab

## 2012-07-01 ENCOUNTER — Ambulatory Visit (HOSPITAL_BASED_OUTPATIENT_CLINIC_OR_DEPARTMENT_OTHER): Payer: Medicare Other | Admitting: Medical

## 2012-07-01 ENCOUNTER — Telehealth: Payer: Self-pay | Admitting: Hematology & Oncology

## 2012-07-01 VITALS — BP 188/89 | HR 58 | Temp 97.5°F | Resp 18 | Ht 70.0 in | Wt 215.0 lb

## 2012-07-01 DIAGNOSIS — C9000 Multiple myeloma not having achieved remission: Secondary | ICD-10-CM

## 2012-07-01 MED ORDER — ZOLEDRONIC ACID 4 MG/100ML IV SOLN
4.0000 mg | Freq: Once | INTRAVENOUS | Status: AC
  Administered 2012-07-01: 4 mg via INTRAVENOUS
  Filled 2012-07-01: qty 100

## 2012-07-01 MED ORDER — SODIUM CHLORIDE 0.9 % IV SOLN
Freq: Once | INTRAVENOUS | Status: AC
  Administered 2012-07-01: 13:00:00 via INTRAVENOUS

## 2012-07-01 NOTE — Progress Notes (Signed)
Diagnosis: IgG kappa myeloma, clinical remission.  Current therapy: Zometa 4 mg IV every 4 months.  Interim history:Mr. Broadfoot presents today for an office followup visit.  Overall, he, reports, that he's been doing relatively well.  He does complain of some allergies/sinuses problems, that he's had it.  He has been on antibiotics.  He is now on over the counter antihistamine and Mucinex.  He's not reported any fevers.  He's not reported any type of purulent sputum.  His most recent protein studies from 06/24/2012, revealed an IgG of 1,170 mg/dl, kappa free light chain of 1.93 mg/dL, and an M spike that is not detected.  He usually comes in 2 weeks to get lab work done.  He does not report any type of bony pain.  He has a good appetite.  He denies any nausea, vomiting, diarrhea, constipation, chest pain, shortness of breath, or cough.  He denies any fevers, chills, or night sweats.  He denies any type of abdominal or back pain.  He denies any obvious, or abnormal bleeding.  He denies any headaches, visual changes, or rashes.  He denies any lower leg swelling.  He still continues on Zometa 4 mg IV every 4 months.  He does followup with [facility] annually.  Review of Systems: Constitutional:Negative for malaise/fatigue, fever, chills, weight loss, diaphoresis, activity change, appetite change, and unexpected weight change.  HEENT: Negative for double vision, blurred vision, visual loss, ear pain, tinnitus, congestion, rhinorrhea, epistaxis sore throat or sinus disease, oral pain/lesion, tongue soreness Respiratory: Negative for cough, chest tightness, shortness of breath, wheezing and stridor.  Cardiovascular: Negative for chest pain, palpitations, leg swelling, orthopnea, PND, DOE or claudication Gastrointestinal: Negative for nausea, vomiting, abdominal pain, diarrhea, constipation, blood in stool, melena, hematochezia, abdominal distention, anal bleeding, rectal pain, anorexia and hematemesis.  Genitourinary:  Negative for dysuria, frequency, hematuria,  Musculoskeletal: Negative for myalgias, back pain, joint swelling, arthralgias and gait problem.  Skin: Negative for rash, color change, pallor and wound.  Neurological:. Negative for dizziness/light-headedness, tremors, seizures, syncope, facial asymmetry, speech difficulty, weakness, numbness, headaches and paresthesias.  Hematological: Negative for adenopathy. Does not bruise/bleed easily.  Psychiatric/Behavioral:  Negative for depression, no loss of interest in normal activity or change in sleep pattern.   Physical Exam: This is a 79 year old, well-developed, well-nourished Afro-American gentleman, in no obvious distress Vitals: Temperature 97.5 degrees, pulse 58, respirations 18, blood pressure 188/89, weight 215 pounds HEENT reveals a normocephalic, atraumatic skull, no scleral icterus, no oral lesions  Neck is supple without any cervical or supraclavicular adenopathy.  Lungs are clear to auscultation bilaterally. There are no wheezes, rales or rhonci Cardiac is regular rate and rhythm with a normal S1 and S2. There are no murmurs, rubs, or bruits.  Abdomen is soft with good bowel sounds, there is no palpable mass. There is no palpable hepatosplenomegaly. There is no palpable fluid wave.  Musculoskeletal no tenderness of the spine, ribs, or hips.  Extremities there are no clubbing, cyanosis, or edema.  Skin no petechia, purpura or ecchymosis Neurologic is nonfocal.  Laboratory Data: White count 5.3, hemoglobin 11.5, hematocrit 35.5.  Platelets 203,000  Current Outpatient Prescriptions on File Prior to Visit  Medication Sig Dispense Refill  . aspirin EC 81 MG tablet Take 81 mg by mouth daily.      Marland Kitchen atenolol (TENORMIN) 50 MG tablet Take 50 mg by mouth daily.      . bimatoprost (LUMIGAN) 0.03 % ophthalmic solution Place 1 drop into both eyes at bedtime.      Marland Kitchen  Calcium Carbonate-Vit D-Min (CALTRATE 600+D PLUS PO) Take 1 tablet by mouth daily.       . Cholecalciferol (VITAMIN D3) 1000 UNITS CAPS Take 1 capsule by mouth daily.      . cyanocobalamin 2000 MCG tablet Take 2,000 mcg by mouth daily.      . hydrochlorothiazide (HYDRODIURIL) 25 MG tablet Take 25 mg by mouth daily.      Marland Kitchen losartan (COZAAR) 50 MG tablet Take 50 mg by mouth daily.      . Multiple Vitamin (MULITIVITAMIN WITH MINERALS) TABS Take 1 tablet by mouth daily.      . pantoprazole (PROTONIX) 20 MG tablet Take 2 tablets (40 mg total) by mouth daily.  30 tablet  0  . polyethylene glycol (MIRALAX / GLYCOLAX) packet Take 17 g by mouth daily.  14 each  0  . potassium chloride (KLOR-CON) 20 MEQ packet Take 20 mEq by mouth daily.      . pravastatin (PRAVACHOL) 20 MG tablet Take 20 mg by mouth at bedtime.      . Zoledronic Acid (ZOMETA) 4 MG/100ML IVPB Inject 4 mg into the vein. Every 4 months.       Assessment/Plan: This is a pleasant, 79 year old, Afro-American gentleman, with the following issues:  #1.  IgG kappa myeloma.  He did undergo a stem cell transplant at [facility], back in December 2006.  He is doing well with an out, any type of relapse.  #2.  Supportive therapy.  He will continue on Zometa 4 mg IV every 4 months.  #3.  Followup.  We will follow back up with Mr. Guerreiro in 4 months, but before then should there be questions or concerns.

## 2012-07-01 NOTE — Telephone Encounter (Signed)
Mailed June schedule °

## 2012-07-01 NOTE — Patient Instructions (Signed)
Zoledronic Acid injection (Hypercalcemia, Oncology) What is this medicine? ZOLEDRONIC ACID (ZOE le dron ik AS id) lowers the amount of calcium loss from bone. It is used to treat too much calcium in your blood from cancer. It is also used to prevent complications of cancer that has spread to the bone. This medicine may be used for other purposes; ask your health care provider or pharmacist if you have questions. What should I tell my health care provider before I take this medicine? They need to know if you have any of these conditions: -aspirin-sensitive asthma -dental disease -kidney disease -an unusual or allergic reaction to zoledronic acid, other medicines, foods, dyes, or preservatives -pregnant or trying to get pregnant -breast-feeding How should I use this medicine? This medicine is for infusion into a vein. It is given by a health care professional in a hospital or clinic setting. Talk to your pediatrician regarding the use of this medicine in children. Special care may be needed. Overdosage: If you think you have taken too much of this medicine contact a poison control center or emergency room at once. NOTE: This medicine is only for you. Do not share this medicine with others. What if I miss a dose? It is important not to miss your dose. Call your doctor or health care professional if you are unable to keep an appointment. What may interact with this medicine? -certain antibiotics given by injection -NSAIDs, medicines for pain and inflammation, like ibuprofen or naproxen -some diuretics like bumetanide, furosemide -teriparatide -thalidomide This list may not describe all possible interactions. Give your health care provider a list of all the medicines, herbs, non-prescription drugs, or dietary supplements you use. Also tell them if you smoke, drink alcohol, or use illegal drugs. Some items may interact with your medicine. What should I watch for while using this medicine? Visit  your doctor or health care professional for regular checkups. It may be some time before you see the benefit from this medicine. Do not stop taking your medicine unless your doctor tells you to. Your doctor may order blood tests or other tests to see how you are doing. Women should inform their doctor if they wish to become pregnant or think they might be pregnant. There is a potential for serious side effects to an unborn child. Talk to your health care professional or pharmacist for more information. You should make sure that you get enough calcium and vitamin D while you are taking this medicine. Discuss the foods you eat and the vitamins you take with your health care professional. Some people who take this medicine have severe bone, joint, and/or muscle pain. This medicine may also increase your risk for a broken thigh bone. Tell your doctor right away if you have pain in your upper leg or groin. Tell your doctor if you have any pain that does not go away or that gets worse. What side effects may I notice from receiving this medicine? Side effects that you should report to your doctor or health care professional as soon as possible: -allergic reactions like skin rash, itching or hives, swelling of the face, lips, or tongue -anxiety, confusion, or depression -breathing problems -changes in vision -feeling faint or lightheaded, falls -jaw burning, cramping, pain -muscle cramps, stiffness, or weakness -trouble passing urine or change in the amount of urine Side effects that usually do not require medical attention (report to your doctor or health care professional if they continue or are bothersome): -bone, joint, or muscle pain -  fever -hair loss -irritation at site where injected -loss of appetite -nausea, vomiting -stomach upset -tired This list may not describe all possible side effects. Call your doctor for medical advice about side effects. You may report side effects to FDA at  1-800-FDA-1088. Where should I keep my medicine? This drug is given in a hospital or clinic and will not be stored at home. NOTE: This sheet is a summary. It may not cover all possible information. If you have questions about this medicine, talk to your doctor, pharmacist, or health care provider.  2013, [employer]vier/Gold Standard. (11/07/2010 9:06:58 AM)  

## 2012-07-09 ENCOUNTER — Other Ambulatory Visit: Payer: Self-pay

## 2012-10-28 ENCOUNTER — Ambulatory Visit (HOSPITAL_BASED_OUTPATIENT_CLINIC_OR_DEPARTMENT_OTHER): Payer: Medicare Other | Admitting: Hematology & Oncology

## 2012-10-28 ENCOUNTER — Other Ambulatory Visit: Payer: Self-pay | Admitting: Pharmacist

## 2012-10-28 ENCOUNTER — Other Ambulatory Visit (HOSPITAL_BASED_OUTPATIENT_CLINIC_OR_DEPARTMENT_OTHER): Payer: Medicare Other | Admitting: Lab

## 2012-10-28 ENCOUNTER — Ambulatory Visit (HOSPITAL_BASED_OUTPATIENT_CLINIC_OR_DEPARTMENT_OTHER): Payer: Medicare Other

## 2012-10-28 VITALS — BP 186/90 | HR 55 | Temp 97.5°F | Resp 18 | Ht 70.0 in | Wt 219.0 lb

## 2012-10-28 DIAGNOSIS — C9001 Multiple myeloma in remission: Secondary | ICD-10-CM

## 2012-10-28 DIAGNOSIS — C9000 Multiple myeloma not having achieved remission: Secondary | ICD-10-CM

## 2012-10-28 LAB — CBC WITH DIFFERENTIAL (CANCER CENTER ONLY)
BASO#: 0 10*3/uL (ref 0.0–0.2)
BASO%: 0.4 % (ref 0.0–2.0)
EOS%: 2.8 % (ref 0.0–7.0)
Eosinophils Absolute: 0.2 10*3/uL (ref 0.0–0.5)
HCT: 36.3 % — ABNORMAL LOW (ref 38.7–49.9)
HGB: 12 g/dL — ABNORMAL LOW (ref 13.0–17.1)
LYMPH#: 2 10*3/uL (ref 0.9–3.3)
LYMPH%: 35.3 % (ref 14.0–48.0)
MCH: 31.8 pg (ref 28.0–33.4)
MCHC: 33.1 g/dL (ref 32.0–35.9)
MCV: 96 fL (ref 82–98)
MONO#: 0.5 10*3/uL (ref 0.1–0.9)
MONO%: 8 % (ref 0.0–13.0)
NEUT#: 3 10*3/uL (ref 1.5–6.5)
NEUT%: 53.5 % (ref 40.0–80.0)
Platelets: 184 10*3/uL (ref 145–400)
RBC: 3.77 10*6/uL — ABNORMAL LOW (ref 4.20–5.70)
RDW: 13.6 % (ref 11.1–15.7)
WBC: 5.6 10*3/uL (ref 4.0–10.0)

## 2012-10-28 LAB — BASIC METABOLIC PANEL - CANCER CENTER ONLY
BUN, Bld: 16 mg/dL (ref 7–22)
CO2: 30 mEq/L (ref 18–33)
Calcium: 10 mg/dL (ref 8.0–10.3)
Chloride: 102 mEq/L (ref 98–108)
Creat: 1.1 mg/dl (ref 0.6–1.2)
Glucose, Bld: 94 mg/dL (ref 73–118)
Potassium: 3.9 mEq/L (ref 3.3–4.7)
Sodium: 135 mEq/L (ref 128–145)

## 2012-10-28 MED ORDER — ZOLEDRONIC ACID 4 MG/100ML IV SOLN
4.0000 mg | Freq: Once | INTRAVENOUS | Status: AC
  Administered 2012-10-28: 4 mg via INTRAVENOUS
  Filled 2012-10-28: qty 100

## 2012-10-28 NOTE — Progress Notes (Signed)
This office note has been dictated.

## 2012-10-28 NOTE — Patient Instructions (Signed)
Zoledronic Acid injection (Hypercalcemia, Oncology) What is this medicine? ZOLEDRONIC ACID (ZOE le dron ik AS id) lowers the amount of calcium loss from bone. It is used to treat too much calcium in your blood from cancer. It is also used to prevent complications of cancer that has spread to the bone. This medicine may be used for other purposes; ask your health care provider or pharmacist if you have questions. What should I tell my health care provider before I take this medicine? They need to know if you have any of these conditions: -aspirin-sensitive asthma -dental disease -kidney disease -an unusual or allergic reaction to zoledronic acid, other medicines, foods, dyes, or preservatives -pregnant or trying to get pregnant -breast-feeding How should I use this medicine? This medicine is for infusion into a vein. It is given by a health care professional in a hospital or clinic setting. Talk to your pediatrician regarding the use of this medicine in children. Special care may be needed. Overdosage: If you think you have taken too much of this medicine contact a poison control center or emergency room at once. NOTE: This medicine is only for you. Do not share this medicine with others. What if I miss a dose? It is important not to miss your dose. Call your doctor or health care professional if you are unable to keep an appointment. What may interact with this medicine? -certain antibiotics given by injection -NSAIDs, medicines for pain and inflammation, like ibuprofen or naproxen -some diuretics like bumetanide, furosemide -teriparatide -thalidomide This list may not describe all possible interactions. Give your health care provider a list of all the medicines, herbs, non-prescription drugs, or dietary supplements you use. Also tell them if you smoke, drink alcohol, or use illegal drugs. Some items may interact with your medicine. What should I watch for while using this medicine? Visit  your doctor or health care professional for regular checkups. It may be some time before you see the benefit from this medicine. Do not stop taking your medicine unless your doctor tells you to. Your doctor may order blood tests or other tests to see how you are doing. Women should inform their doctor if they wish to become pregnant or think they might be pregnant. There is a potential for serious side effects to an unborn child. Talk to your health care professional or pharmacist for more information. You should make sure that you get enough calcium and vitamin D while you are taking this medicine. Discuss the foods you eat and the vitamins you take with your health care professional. Some people who take this medicine have severe bone, joint, and/or muscle pain. This medicine may also increase your risk for a broken thigh bone. Tell your doctor right away if you have pain in your upper leg or groin. Tell your doctor if you have any pain that does not go away or that gets worse. What side effects may I notice from receiving this medicine? Side effects that you should report to your doctor or health care professional as soon as possible: -allergic reactions like skin rash, itching or hives, swelling of the face, lips, or tongue -anxiety, confusion, or depression -breathing problems -changes in vision -feeling faint or lightheaded, falls -jaw burning, cramping, pain -muscle cramps, stiffness, or weakness -trouble passing urine or change in the amount of urine Side effects that usually do not require medical attention (report to your doctor or health care professional if they continue or are bothersome): -bone, joint, or muscle pain -  fever -hair loss -irritation at site where injected -loss of appetite -nausea, vomiting -stomach upset -tired This list may not describe all possible side effects. Call your doctor for medical advice about side effects. You may report side effects to FDA at  1-800-FDA-1088. Where should I keep my medicine? This drug is given in a hospital or clinic and will not be stored at home. NOTE: This sheet is a summary. It may not cover all possible information. If you have questions about this medicine, talk to your doctor, pharmacist, or health care provider.  2013, [employer]vier/Gold Standard. (11/07/2010 9:06:58 AM)  

## 2012-10-31 NOTE — Progress Notes (Signed)
CC:   Robert Corning, MD, Fax [phone]  DIAGNOSIS:  IgG kappa myeloma, clinical remission.  CURRENT THERAPY:  Zometa 4 mg IV q.4 months.  INTERIM HISTORY:  Robert [name] comes in for his followup.  He is doing fairly well.  He has had no complaints since we last saw him.  When we last saw him, there was no monoclonal spike noted in his serum. His IgG level was 1170 mg/dL.  Kappa light chain was 1.42 mg/dL.  His weight is still an issue.  I keep trying to get him to lose some weight.  He has had no cough or shortness of breath.  There has been no change in bowel or bladder habits.  He has had no leg swelling.  He has not noticed any kind of rashes.  PHYSICAL EXAMINATION:  General:  This is a well-developed, well- nourished African American gentleman in no obvious distress.  Vital signs:  Temperature of 97.5, pulse 55, respiratory rate 18, blood pressure is 176/81.  Weight is 219.  Head and neck:  Normocephalic, atraumatic skull.  There are no ocular or oral lesions.  There are no palpable cervical or supraclavicular lymph nodes.  Lungs:  Clear bilaterally.  Cardiac:  Regular rate and rhythm with a normal S1 and S2. There are no murmurs, rubs or bruits.  Abdomen:  Soft with good bowel sounds.  He is mildly obese.  There is no fluid wave.  There is no palpable hepatosplenomegaly.  Extremities:  Show no clubbing, cyanosis or edema.  Skin:  No rashes, ecchymoses or petechiae.  LABORATORY STUDIES:  White cell count 5.6, hemoglobin 12, hematocrit 36.3, platelet count 184.  IMPRESSION:  Robert [name] is a very nice, 79 year old gentleman with a history of IgG kappa myeloma.  He underwent induction chemotherapy followed by stem cell transplant.  He had a stem cell transplant at [facility] in December 2006.  Again, I do not see any evidence of recurrent disease.  I still have him on Zometa, as I feel that this is providing some benefit with respect to recurrence.  It is also helping to keep his  bones strong.  We will go ahead and plan to get him back in 4 more months.   ______________________________ Josph Macho, M.D. PRE/MEDQ  D:  10/28/2012  T:  10/29/2012  Job:  [id number]

## 2012-11-01 LAB — IGG, IGA, IGM
IgA: 138 mg/dL (ref 68–379)
IgG (Immunoglobin G), Serum: 1220 mg/dL (ref 650–1600)
IgM, Serum: 194 mg/dL (ref 41–251)

## 2012-11-01 LAB — KAPPA/LAMBDA LIGHT CHAINS
Kappa free light chain: 2.32 mg/dL — ABNORMAL HIGH (ref 0.33–1.94)
Kappa:Lambda Ratio: 1.24 (ref 0.26–1.65)
Lambda Free Lght Chn: 1.87 mg/dL (ref 0.57–2.63)

## 2012-11-01 LAB — PROTEIN ELECTROPHORESIS, SERUM, WITH REFLEX
Albumin ELP: 57.9 % (ref 55.8–66.1)
Alpha-1-Globulin: 3.8 % (ref 2.9–4.9)
Alpha-2-Globulin: 9.8 % (ref 7.1–11.8)
Beta 2: 4.5 % (ref 3.2–6.5)
Beta Globulin: 6.3 % (ref 4.7–7.2)
Gamma Globulin: 17.7 % (ref 11.1–18.8)
Total Protein, Serum Electrophoresis: 7 g/dL (ref 6.0–8.3)

## 2012-12-28 ENCOUNTER — Other Ambulatory Visit: Payer: Self-pay

## 2013-03-03 ENCOUNTER — Ambulatory Visit (HOSPITAL_BASED_OUTPATIENT_CLINIC_OR_DEPARTMENT_OTHER): Payer: Medicare Other

## 2013-03-03 ENCOUNTER — Other Ambulatory Visit (HOSPITAL_BASED_OUTPATIENT_CLINIC_OR_DEPARTMENT_OTHER): Payer: Medicare Other | Admitting: Lab

## 2013-03-03 ENCOUNTER — Ambulatory Visit (HOSPITAL_BASED_OUTPATIENT_CLINIC_OR_DEPARTMENT_OTHER): Payer: Medicare Other | Admitting: Hematology & Oncology

## 2013-03-03 VITALS — BP 162/78 | HR 56 | Temp 98.1°F | Resp 18 | Ht 70.0 in | Wt 212.0 lb

## 2013-03-03 DIAGNOSIS — C9000 Multiple myeloma not having achieved remission: Secondary | ICD-10-CM

## 2013-03-03 DIAGNOSIS — C9001 Multiple myeloma in remission: Secondary | ICD-10-CM

## 2013-03-03 DIAGNOSIS — B029 Zoster without complications: Secondary | ICD-10-CM

## 2013-03-03 LAB — CBC WITH DIFFERENTIAL (CANCER CENTER ONLY)
BASO#: 0 10*3/uL (ref 0.0–0.2)
BASO%: 0.4 % (ref 0.0–2.0)
EOS%: 2.1 % (ref 0.0–7.0)
Eosinophils Absolute: 0.1 10*3/uL (ref 0.0–0.5)
HCT: 37 % — ABNORMAL LOW (ref 38.7–49.9)
HGB: 12.2 g/dL — ABNORMAL LOW (ref 13.0–17.1)
LYMPH#: 1.9 10*3/uL (ref 0.9–3.3)
LYMPH%: 33.5 % (ref 14.0–48.0)
MCH: 31.5 pg (ref 28.0–33.4)
MCHC: 33 g/dL (ref 32.0–35.9)
MCV: 96 fL (ref 82–98)
MONO#: 0.5 10*3/uL (ref 0.1–0.9)
MONO%: 9.1 % (ref 0.0–13.0)
NEUT#: 3.1 10*3/uL (ref 1.5–6.5)
NEUT%: 54.9 % (ref 40.0–80.0)
Platelets: 185 10*3/uL (ref 145–400)
RBC: 3.87 10*6/uL — ABNORMAL LOW (ref 4.20–5.70)
RDW: 13.2 % (ref 11.1–15.7)
WBC: 5.6 10*3/uL (ref 4.0–10.0)

## 2013-03-03 MED ORDER — FAMCICLOVIR 500 MG PO TABS: 500.0000 mg | ORAL_TABLET | Freq: Three times a day (TID) | ORAL | Status: DC

## 2013-03-03 MED ORDER — ZOLEDRONIC ACID 4 MG/100ML IV SOLN
4.0000 mg | Freq: Once | INTRAVENOUS | Status: AC
  Administered 2013-03-03: 4 mg via INTRAVENOUS
  Filled 2013-03-03: qty 100

## 2013-03-03 MED ORDER — FAMCICLOVIR 250 MG PO TABS: ORAL_TABLET | ORAL | Status: DC

## 2013-03-03 NOTE — Progress Notes (Signed)
This office note has been dictated.

## 2013-03-06 NOTE — Progress Notes (Signed)
CC:   Alta Corning, MD, Fax [phone]  DIAGNOSES: 1. IgG kappa myeloma, clinical remission. 2. Recurrent herpes zoster in the right C1 dermatome.  CURRENT THERAPY: 1. Zometa 4 mg IV q. month. 2. Famvir to be given as a maintenance dose.  INTERIM HISTORY:  Mr. Mcclenny comes in for his followup.  He developed an area of shingles.  This appears to be in the cranial nerve 5 region.  It is in the supraorbital distribution on the right side.  He has had some burning in this area.  This happened about 4 days ago. He had another episode probably about 2 years ago.  We gave him some Famvir for this.  I think he is probably going to need to be on maintenance Famvir.  Otherwise he is doing pretty well.  He is losing a little bit of weight. He is trying to exercise a little bit more.  His last monoclonal studies done back in June did not show a monoclonal spike.  His IgG level was 1220 mg/dL.  His kappa light chain was 2.32 mg/dL.  He has had no fevers.  He has had no change in bowel or bladder habits. He has had no leg swelling.  He has not noted any kind of bony pain.  PHYSICAL EXAMINATION:  General:  This is a well-developed, well- nourished black gentleman in no obvious distress.  Vital Signs: Temperature 98.1, pulse 56, respiratory rate 18, blood pressure 162/78. Weight is 212 pounds.  Head and neck:  Normocephalic, atraumatic skull. There are no ocular or oral lesions.  There are no palpable cervical or supraclavicular lymph nodes.  Lungs:  Clear bilaterally.  Cardiac: Regular rate and rhythm, with a normal S1 and S2.  There are no murmurs, rubs or bruits.  Abdomen:  Soft.  He has good bowel sounds.  There is no fluid wave.  There is no palpable abdominal mass.  There is no palpable hepatosplenomegaly.  Back:  No tenderness over the spine, ribs, or hips. Extremities:  No clubbing, cyanosis or edema.  He has good range motion of his joints.  Skin:  Does show the herpetic rash in the  right supraorbital region.  LABORATORY STUDIES:  White cell count 5.6, hemoglobin 12.2, hematocrit 37, platelet count is 185.  IMPRESSION:  Mr. Canner is a 79 year old gentleman with a history of IgG kappa myeloma.  He ultimately underwent stem cell transplantation at [facility].  This was back in December 2006.  He is having no problems with myeloma since then.  He gets his Zometa every 4 months.  We will do that today.  We will get him back on Famvir.  We will give a therapeutic dose for 7 days and then get him on a maintenance dose.  I will plan to see him back in another 4 months.    ______________________________ Josph Macho, M.D. PRE/MEDQ  D:  03/03/2013  T:  03/04/2013  Job:  [id number]

## 2013-03-07 LAB — COMPREHENSIVE METABOLIC PANEL
ALT: 14 U/L (ref 0–53)
AST: 19 U/L (ref 0–37)
Albumin: 4.1 g/dL (ref 3.5–5.2)
Alkaline Phosphatase: 52 U/L (ref 39–117)
BUN: 15 mg/dL (ref 6–23)
CO2: 29 mEq/L (ref 19–32)
Calcium: 9.9 mg/dL (ref 8.4–10.5)
Chloride: 102 mEq/L (ref 96–112)
Creatinine, Ser: 1.16 mg/dL (ref 0.50–1.35)
Glucose, Bld: 90 mg/dL (ref 70–99)
Potassium: 3.8 mEq/L (ref 3.5–5.3)
Sodium: 136 mEq/L (ref 135–145)
Total Bilirubin: 0.8 mg/dL (ref 0.3–1.2)
Total Protein: 6.8 g/dL (ref 6.0–8.3)

## 2013-03-07 LAB — PROTEIN ELECTROPHORESIS, SERUM, WITH REFLEX
Albumin ELP: 56.5 % (ref 55.8–66.1)
Alpha-1-Globulin: 5.6 % — ABNORMAL HIGH (ref 2.9–4.9)
Alpha-2-Globulin: 10 % (ref 7.1–11.8)
Beta 2: 4.6 % (ref 3.2–6.5)
Beta Globulin: 5.5 % (ref 4.7–7.2)
Gamma Globulin: 17.8 % (ref 11.1–18.8)
Total Protein, Serum Electrophoresis: 6.8 g/dL (ref 6.0–8.3)

## 2013-03-07 LAB — KAPPA/LAMBDA LIGHT CHAINS
Kappa free light chain: 2.28 mg/dL — ABNORMAL HIGH (ref 0.33–1.94)
Kappa:Lambda Ratio: 1.16 (ref 0.26–1.65)
Lambda Free Lght Chn: 1.96 mg/dL (ref 0.57–2.63)

## 2013-03-07 LAB — IGG, IGA, IGM
IgA: 149 mg/dL (ref 68–379)
IgG (Immunoglobin G), Serum: 1230 mg/dL (ref 650–1600)
IgM, Serum: 200 mg/dL (ref 41–251)

## 2013-03-09 ENCOUNTER — Telehealth: Payer: Self-pay | Admitting: Oncology

## 2013-03-09 NOTE — Telephone Encounter (Signed)
Message copied by Lacie Draft on Thu Mar 09, 2013  4:17 PM ------      Message from: Josph Macho      Created: Tue Mar 07, 2013  5:23 PM       call - no myeloma!!!  Cindee Lame ------

## 2013-03-09 NOTE — Telephone Encounter (Addendum)
Message copied by Lacie Draft on Thu Mar 09, 2013  4:18 PM ------      Message from: Josph Macho      Created: Tue Mar 07, 2013  5:23 PM       call - no myeloma!!!  Cindee Lame ------Left message on voicemail.

## 2013-03-24 ENCOUNTER — Telehealth: Payer: Self-pay | Admitting: Hematology & Oncology

## 2013-03-24 NOTE — Telephone Encounter (Signed)
Faxed pt's most recent progress note to Diona Browner with [facility] from [facility] today to: [phone].  DATE LAST SEEN:  03/03/2013

## 2013-03-30 ENCOUNTER — Other Ambulatory Visit: Payer: Self-pay

## 2013-05-05 ENCOUNTER — Other Ambulatory Visit: Payer: Self-pay | Admitting: *Deleted

## 2013-05-05 DIAGNOSIS — C9000 Multiple myeloma not having achieved remission: Secondary | ICD-10-CM

## 2013-05-05 MED ORDER — FAMCICLOVIR 250 MG PO TABS: 250.0000 mg | ORAL_TABLET | Freq: Every day | ORAL | Status: DC

## 2013-05-05 NOTE — Telephone Encounter (Signed)
Received mail order request from [facility] for a 90 day supply of Famvir 250 mg daily. Sent via e-rx as this is a chronic med for the pt.

## 2013-07-07 ENCOUNTER — Encounter: Payer: Self-pay | Admitting: Hematology & Oncology

## 2013-07-07 ENCOUNTER — Ambulatory Visit (HOSPITAL_BASED_OUTPATIENT_CLINIC_OR_DEPARTMENT_OTHER): Payer: Medicare Other | Admitting: Lab

## 2013-07-07 ENCOUNTER — Ambulatory Visit (HOSPITAL_BASED_OUTPATIENT_CLINIC_OR_DEPARTMENT_OTHER): Payer: Medicare Other | Admitting: Hematology & Oncology

## 2013-07-07 ENCOUNTER — Ambulatory Visit (HOSPITAL_BASED_OUTPATIENT_CLINIC_OR_DEPARTMENT_OTHER): Payer: Medicare Other

## 2013-07-07 VITALS — BP 195/97 | HR 56 | Temp 97.5°F | Resp 18 | Ht 70.0 in | Wt 215.0 lb

## 2013-07-07 DIAGNOSIS — C9000 Multiple myeloma not having achieved remission: Secondary | ICD-10-CM

## 2013-07-07 LAB — CBC WITH DIFFERENTIAL (CANCER CENTER ONLY)
BASO#: 0 10*3/uL (ref 0.0–0.2)
BASO%: 0.3 % (ref 0.0–2.0)
EOS%: 1.9 % (ref 0.0–7.0)
Eosinophils Absolute: 0.1 10*3/uL (ref 0.0–0.5)
HCT: 39.2 % (ref 38.7–49.9)
HGB: 12.7 g/dL — ABNORMAL LOW (ref 13.0–17.1)
LYMPH#: 2.1 10*3/uL (ref 0.9–3.3)
LYMPH%: 37 % (ref 14.0–48.0)
MCH: 31.1 pg (ref 28.0–33.4)
MCHC: 32.4 g/dL (ref 32.0–35.9)
MCV: 96 fL (ref 82–98)
MONO#: 0.4 10*3/uL (ref 0.1–0.9)
MONO%: 6.8 % (ref 0.0–13.0)
NEUT#: 3.1 10*3/uL (ref 1.5–6.5)
NEUT%: 54 % (ref 40.0–80.0)
Platelets: 196 10*3/uL (ref 145–400)
RBC: 4.08 10*6/uL — ABNORMAL LOW (ref 4.20–5.70)
RDW: 13.3 % (ref 11.1–15.7)
WBC: 5.8 10*3/uL (ref 4.0–10.0)

## 2013-07-07 MED ORDER — SODIUM CHLORIDE 0.9 % IV SOLN
INTRAVENOUS | Status: DC
  Administered 2013-07-07: 12:00:00 via INTRAVENOUS

## 2013-07-07 MED ORDER — ZOLEDRONIC ACID 4 MG/100ML IV SOLN
4.0000 mg | Freq: Once | INTRAVENOUS | Status: AC
  Administered 2013-07-07: 4 mg via INTRAVENOUS
  Filled 2013-07-07: qty 100

## 2013-07-07 NOTE — Progress Notes (Signed)
[facility] at [place], [place], [place].  [phone] (fax)   Your Providers Referring Provider: Delorise Jackson, MD    DIAGNOSES:  Problem List Items Addressed This Visit     Oncology   Myeloma - Primary      CURRENT THERAPY: Zometa 4 mg IV every month  INTERIM HISTORY: Mr. Santistevan in for followup. We saw back in October. He's been doing well. There's been no evidence of myeloma recurrence. He has IgG kappa myeloma. We last saw him, he had no detectable monoclonal spike in his serum. His IgG level was 1230 mg/dL. A kappa light chain was 2.28 mg/dL.  He still not losing weight. His blood pressure is on the high side. He is on blood pressure medications.  He's had no bony pain. He's had no cough. His appetite isn't too good. There is no change in bowel or bladder habits.  PHYSICAL EXAMINATION:    Vital signs:   Filed Vitals:   07/07/13 1043  BP: 195/97  Pulse: 56  Temp: 97.5 F (36.4 C)  TempSrc: Oral  Resp: 18  Height: 5\' 10"  (1.778 m)  Weight: 215 lb (97.523 kg)    This is a well-developed well-nourished African American gentleman in no obvious distress. Head and neck exam shows no other oral lesions. Has no palpable cervical or supraclavicular lymph nodes. Lungs are clear. Cardiac exam regular in rhythm with no murmurs rubs or bruits. Abdomen is soft. Has good bowel sounds. There is no fluid wave. He is no palpable liver or spleen. Extremities shows no clubbing cyanosis or edema. Has good strength in his legs. Skin exam no rashes. Neurological exam no focal neurological deficits. LABORATORY STUDIES:  CBC    Component Value Date/Time   WBC 5.8 07/07/2013 1004   WBC 7.0 04/14/2012 0130   WBC 5.4 11/02/2007 0948   RBC 3.88* 04/14/2012 0130   RBC 3.67* 11/02/2007 0948   HGB 12.7* 07/07/2013 1004   HGB 11.2* 04/14/2012 0356   HGB 11.2* 11/02/2007 0948   HCT 39.2 07/07/2013 1004   HCT 33.0* 04/14/2012  0356   HCT 33.5* 11/02/2007 0948   PLT 196 07/07/2013 1004   PLT 237 04/14/2012 0130   PLT 231 11/02/2007 0948   MCV 96 07/07/2013 1004   MCV 89.2 04/14/2012 0130   MCV 91.2 11/02/2007 0948   MCH 31.1 07/07/2013 1004   MCH 30.7 04/14/2012 0130   MCH 30.6 11/02/2007 0948   MCHC 32.4 07/07/2013 1004   MCHC 34.4 04/14/2012 0130   MCHC 33.5 11/02/2007 0948   RDW 13.3 07/07/2013 1004   RDW 13.2 04/14/2012 0130   RDW 14.2 11/02/2007 0948   LYMPHSABS 2.1 07/07/2013 1004   LYMPHSABS 2.6 04/14/2012 0130   LYMPHSABS 2.5 11/02/2007 0948   MONOABS 0.5 04/14/2012 0130   MONOABS 0.4 11/02/2007 0948   EOSABS 0.1 07/07/2013 1004   EOSABS 0.1 04/14/2012 0130   EOSABS 0.0 11/02/2007 0948   BASOSABS 0.0 07/07/2013 1004   BASOSABS 0.0 04/14/2012 0130   BASOSABS 0.0 11/02/2007 0948     IMPRESSION: Mr. Oki is a 80 y.o. male with a history of IgG kappa myeloma. He did undergo stem cell transplant at [facility] in October 2008. He is in remission.  We do Zometa every 4 months as I do feel this helps..  Again, his weight will be his biggest problem. Blood pressure control will need to be addressed by his family doctor.  PLAN:  We will plan to get him back to see [place] in another 4 months. If there is any problem in between visits, he can certainly come back and see [place].   ______________________________  Volanda Napoleon, M.D.  07/07/2013 11:14 AM

## 2013-07-07 NOTE — Patient Instructions (Signed)

## 2013-07-11 ENCOUNTER — Encounter: Payer: Self-pay | Admitting: Nurse Practitioner

## 2013-07-11 LAB — COMPREHENSIVE METABOLIC PANEL
ALT: 18 U/L (ref 0–53)
AST: 22 U/L (ref 0–37)
Albumin: 4.2 g/dL (ref 3.5–5.2)
Alkaline Phosphatase: 57 U/L (ref 39–117)
BUN: 13 mg/dL (ref 6–23)
CO2: 24 mEq/L (ref 19–32)
Calcium: 9.8 mg/dL (ref 8.4–10.5)
Chloride: 103 mEq/L (ref 96–112)
Creatinine, Ser: 1.11 mg/dL (ref 0.50–1.35)
Glucose, Bld: 73 mg/dL (ref 70–99)
Potassium: 4.1 mEq/L (ref 3.5–5.3)
Sodium: 138 mEq/L (ref 135–145)
Total Bilirubin: 0.9 mg/dL (ref 0.2–1.2)
Total Protein: 7.4 g/dL (ref 6.0–8.3)

## 2013-07-11 LAB — KAPPA/LAMBDA LIGHT CHAINS
Kappa free light chain: 2.03 mg/dL — ABNORMAL HIGH (ref 0.33–1.94)
Kappa:Lambda Ratio: 1.21 (ref 0.26–1.65)
Lambda Free Lght Chn: 1.68 mg/dL (ref 0.57–2.63)

## 2013-07-11 LAB — PROTEIN ELECTROPHORESIS, SERUM, WITH REFLEX
Albumin ELP: 56.7 % (ref 55.8–66.1)
Alpha-1-Globulin: 3.8 % (ref 2.9–4.9)
Alpha-2-Globulin: 10.4 % (ref 7.1–11.8)
Beta 2: 4.6 % (ref 3.2–6.5)
Beta Globulin: 5.9 % (ref 4.7–7.2)
Gamma Globulin: 18.6 % (ref 11.1–18.8)
Total Protein, Serum Electrophoresis: 7.4 g/dL (ref 6.0–8.3)

## 2013-07-11 LAB — IFE INTERPRETATION

## 2013-07-11 LAB — IGG, IGA, IGM
IgA: 175 mg/dL (ref 68–379)
IgG (Immunoglobin G), Serum: 1330 mg/dL (ref 650–1600)
IgM, Serum: 230 mg/dL (ref 41–251)

## 2013-11-03 ENCOUNTER — Ambulatory Visit (HOSPITAL_BASED_OUTPATIENT_CLINIC_OR_DEPARTMENT_OTHER): Payer: Medicare Other | Admitting: Lab

## 2013-11-03 ENCOUNTER — Ambulatory Visit (HOSPITAL_BASED_OUTPATIENT_CLINIC_OR_DEPARTMENT_OTHER): Payer: Medicare Other | Admitting: Hematology & Oncology

## 2013-11-03 ENCOUNTER — Encounter: Payer: Self-pay | Admitting: Hematology & Oncology

## 2013-11-03 ENCOUNTER — Ambulatory Visit (HOSPITAL_BASED_OUTPATIENT_CLINIC_OR_DEPARTMENT_OTHER): Payer: Medicare Other

## 2013-11-03 VITALS — BP 159/83 | HR 51 | Temp 97.5°F | Resp 18 | Ht 70.0 in | Wt 210.0 lb

## 2013-11-03 DIAGNOSIS — C9000 Multiple myeloma not having achieved remission: Secondary | ICD-10-CM

## 2013-11-03 LAB — CBC WITH DIFFERENTIAL (CANCER CENTER ONLY)
BASO#: 0 10*3/uL (ref 0.0–0.2)
BASO%: 0.4 % (ref 0.0–2.0)
EOS%: 2.4 % (ref 0.0–7.0)
Eosinophils Absolute: 0.1 10*3/uL (ref 0.0–0.5)
HCT: 37.9 % — ABNORMAL LOW (ref 38.7–49.9)
HGB: 12.9 g/dL — ABNORMAL LOW (ref 13.0–17.1)
LYMPH#: 2.3 10*3/uL (ref 0.9–3.3)
LYMPH%: 41.2 % (ref 14.0–48.0)
MCH: 32.5 pg (ref 28.0–33.4)
MCHC: 34 g/dL (ref 32.0–35.9)
MCV: 96 fL (ref 82–98)
MONO#: 0.4 10*3/uL (ref 0.1–0.9)
MONO%: 7.3 % (ref 0.0–13.0)
NEUT#: 2.7 10*3/uL (ref 1.5–6.5)
NEUT%: 48.7 % (ref 40.0–80.0)
Platelets: 196 10*3/uL (ref 145–400)
RBC: 3.97 10*6/uL — ABNORMAL LOW (ref 4.20–5.70)
RDW: 12.8 % (ref 11.1–15.7)
WBC: 5.5 10*3/uL (ref 4.0–10.0)

## 2013-11-03 LAB — CMP (CANCER CENTER ONLY)
ALT(SGPT): 17 U/L (ref 10–47)
AST: 22 U/L (ref 11–38)
Albumin: 3.8 g/dL (ref 3.3–5.5)
Alkaline Phosphatase: 55 U/L (ref 26–84)
BUN, Bld: 13 mg/dL (ref 7–22)
CO2: 29 mEq/L (ref 18–33)
Calcium: 9.6 mg/dL (ref 8.0–10.3)
Chloride: 99 mEq/L (ref 98–108)
Creat: 1.1 mg/dl (ref 0.6–1.2)
Glucose, Bld: 82 mg/dL (ref 73–118)
Potassium: 3.1 mEq/L — ABNORMAL LOW (ref 3.3–4.7)
Sodium: 137 mEq/L (ref 128–145)
Total Bilirubin: 1.3 mg/dl (ref 0.20–1.60)
Total Protein: 7.8 g/dL (ref 6.4–8.1)

## 2013-11-03 MED ORDER — SODIUM CHLORIDE 0.9 % IV SOLN
Freq: Once | INTRAVENOUS | Status: AC
  Administered 2013-11-03: 11:00:00 via INTRAVENOUS

## 2013-11-03 MED ORDER — ZOLEDRONIC ACID 4 MG/100ML IV SOLN
4.0000 mg | Freq: Once | INTRAVENOUS | Status: AC
  Administered 2013-11-03: 4 mg via INTRAVENOUS
  Filled 2013-11-03: qty 100

## 2013-11-03 NOTE — Progress Notes (Signed)
Hematology and Oncology Follow Up Visit  Robert [name] [id number] 12-23-1933 80 y.o. 11/03/2013   Principle Diagnosis:   IgG Kappa Myeloma-remission  Current Therapy:    Zometa 4 mg IV q. 4 months     Interim History:  [name] is back for followup. He is doing well. He's had no problems since last saw him. He's losing some weight. He is exercising a little more. He's had no problems with respect to recurrent shingles.  He's had no nausea vomiting. He's had no change in bowel or bladder habits. He's had no rashes present no leg swelling.  We last checked his myeloma cells, there is no detectable monoclonal spike in his serum. His IgG level was 1330 mg/dL. His Kappa light chain was a 2.03 mg/dL.  He did have a root canal done yesterday. I don't think this will be a problem with him having Zometa.  Medications: Current outpatient prescriptions:amoxicillin (AMOXIL) 500 MG capsule, Take 500 mg by mouth 2 (two) times daily., Disp: , Rfl: ;  aspirin EC 81 MG tablet, Take 81 mg by mouth daily., Disp: , Rfl: ;  atenolol (TENORMIN) 50 MG tablet, Take 50 mg by mouth daily., Disp: , Rfl: ;  bimatoprost (LUMIGAN) 0.03 % ophthalmic solution, Place 1 drop into both eyes at bedtime., Disp: , Rfl:  Calcium Carbonate-Vit D-Min (CALTRATE 600+D PLUS PO), Take 1 tablet by mouth daily., Disp: , Rfl: ;  Cholecalciferol (VITAMIN D3) 1000 UNITS CAPS, Take 1 capsule by mouth daily., Disp: , Rfl: ;  cyanocobalamin 2000 MCG tablet, Take 2,000 mcg by mouth daily., Disp: , Rfl: ;  famciclovir (FAMVIR) 250 MG tablet, Take 1 tablet (250 mg total) by mouth daily., Disp: 90 tablet, Rfl: 4 fluticasone (FLONASE) 50 MCG/ACT nasal spray, 2 sprays. Place 2 sprays into both nostrils daily., Disp: , Rfl: ;  Fluticasone-Salmeterol (ADVAIR DISKUS IN), Inhale into the lungs as needed., Disp: , Rfl: ;  hydrochlorothiazide (HYDRODIURIL) 25 MG tablet, Take 25 mg by mouth daily., Disp: , Rfl: ;  losartan (COZAAR) 50 MG tablet, Take 50 mg  by mouth daily., Disp: , Rfl:  Multiple Vitamin (MULITIVITAMIN WITH MINERALS) TABS, Take 1 tablet by mouth daily., Disp: , Rfl: ;  potassium chloride (KLOR-CON) 20 MEQ packet, Take 20 mEq by mouth daily., Disp: , Rfl: ;  pravastatin (PRAVACHOL) 20 MG tablet, Take 20 mg by mouth at bedtime., Disp: , Rfl: ;  Zoledronic Acid (ZOMETA) 4 MG/100ML IVPB, Inject 4 mg into the vein. Every 4 months., Disp: , Rfl:  No current facility-administered medications for this visit. Facility-Administered Medications Ordered in Other Visits: 0.9 %  sodium chloride infusion, , Intravenous, Once, Volanda Napoleon, MD;  zolendronic acid (ZOMETA) 4 mg in sodium chloride 0.9 % 100 mL IVPB, 4 mg, Intravenous, Once, Volanda Napoleon, MD  Allergies: No Known Allergies  Past Medical History, Surgical history, Social history, and Family History were reviewed and updated.  Review of Systems: As above  Physical Exam:  height is 5\' 10"  (1.778 m) and weight is 210 lb (95.255 kg). His oral temperature is 97.5 F (36.4 C). His blood pressure is 159/83 and his pulse is 51. His respiration is 18.   Well-developed and well-nourished African American gentleman. There are Dr. or oral lesions. She has no palpable cervical or supraclavicular lymph nodes. Lungs are clear. Cardiac exam regular rate and rhythm with no murmurs rubs or bruits. Abdomen soft. Has good bowel sounds. There is no fluid wave. There is no  palpable liver or spleen tip. Back exam no tenderness of the spine ribs or hips. Extremities shows no clubbing cyanosis or edema. His good range which it was twisted has good muscle strength. Skin exam no rashes, ecchymosis or petechia. Neurological exam shows no focal deficits. Lab Results  Component Value Date   WBC 5.5 11/03/2013   HGB 12.9* 11/03/2013   HCT 37.9* 11/03/2013   MCV 96 11/03/2013   PLT 196 11/03/2013     Chemistry      Component Value Date/Time   NA 137 11/03/2013 0940   NA 138 07/07/2013 1004   K 3.1* 11/03/2013  0940   K 4.1 07/07/2013 1004   CL 99 11/03/2013 0940   CL 103 07/07/2013 1004   CO2 29 11/03/2013 0940   CO2 24 07/07/2013 1004   BUN 13 11/03/2013 0940   BUN 13 07/07/2013 1004   CREATININE 1.1 11/03/2013 0940   CREATININE 1.11 07/07/2013 1004      Component Value Date/Time   CALCIUM 9.6 11/03/2013 0940   CALCIUM 9.8 07/07/2013 1004   ALKPHOS 55 11/03/2013 0940   ALKPHOS 57 07/07/2013 1004   AST 22 11/03/2013 0940   AST 22 07/07/2013 1004   ALT 17 11/03/2013 0940   ALT 18 07/07/2013 1004   BILITOT 1.30 11/03/2013 0940   BILITOT 0.9 07/07/2013 1004         Impression and Plan: Robert [name] is 80 year old gentleman with myeloma. He is IgG Kappa myeloma. He underwent stem cell transplant back in 2006. He has been in remission since.  I do think that the Zometa is helping him. He is tolerating this well. He has no problems with his kidneys.  We will go ahead and plan for another 4 month followup.   Volanda Napoleon, MD 6/12/201511:03 AM

## 2013-11-03 NOTE — Patient Instructions (Signed)

## 2013-11-07 LAB — KAPPA/LAMBDA LIGHT CHAINS
Kappa free light chain: 2.67 mg/dL — ABNORMAL HIGH (ref 0.33–1.94)
Kappa:Lambda Ratio: 1.69 — ABNORMAL HIGH (ref 0.26–1.65)
Lambda Free Lght Chn: 1.58 mg/dL (ref 0.57–2.63)

## 2013-11-07 LAB — IGG, IGA, IGM
IgA: 158 mg/dL (ref 68–379)
IgG (Immunoglobin G), Serum: 1270 mg/dL (ref 650–1600)
IgM, Serum: 213 mg/dL (ref 41–251)

## 2013-11-07 LAB — PROTEIN ELECTROPHORESIS, SERUM, WITH REFLEX
Albumin ELP: 56.8 % (ref 55.8–66.1)
Alpha-1-Globulin: 3.7 % (ref 2.9–4.9)
Alpha-2-Globulin: 10 % (ref 7.1–11.8)
Beta 2: 4.9 % (ref 3.2–6.5)
Beta Globulin: 5.9 % (ref 4.7–7.2)
Gamma Globulin: 18.7 % (ref 11.1–18.8)
Total Protein, Serum Electrophoresis: 7.9 g/dL (ref 6.0–8.3)

## 2013-11-07 LAB — IFE INTERPRETATION

## 2013-11-08 ENCOUNTER — Telehealth: Payer: Self-pay

## 2013-11-08 NOTE — Telephone Encounter (Addendum)
Message copied by Johny Drilling on Wed Nov 08, 2013  9:14 AM ------      Message from: Volanda Napoleon      Created: Tue Nov 07, 2013  2:45 PM       Call - myeloma is still not active.  pete ------  Above message left on personalized VM with instructions to contact our office for questions/concerns. dph

## 2014-03-02 ENCOUNTER — Ambulatory Visit (HOSPITAL_BASED_OUTPATIENT_CLINIC_OR_DEPARTMENT_OTHER): Payer: Medicare Other | Admitting: Family

## 2014-03-02 ENCOUNTER — Encounter: Payer: Self-pay | Admitting: Hematology & Oncology

## 2014-03-02 ENCOUNTER — Ambulatory Visit (HOSPITAL_BASED_OUTPATIENT_CLINIC_OR_DEPARTMENT_OTHER): Payer: Medicare Other

## 2014-03-02 ENCOUNTER — Ambulatory Visit (HOSPITAL_BASED_OUTPATIENT_CLINIC_OR_DEPARTMENT_OTHER): Payer: Medicare Other | Admitting: Lab

## 2014-03-02 VITALS — BP 185/98 | HR 51 | Temp 98.1°F | Resp 18 | Ht 69.0 in | Wt 214.0 lb

## 2014-03-02 DIAGNOSIS — C9000 Multiple myeloma not having achieved remission: Secondary | ICD-10-CM

## 2014-03-02 DIAGNOSIS — C9001 Multiple myeloma in remission: Secondary | ICD-10-CM | POA: Diagnosis not present

## 2014-03-02 LAB — CBC WITH DIFFERENTIAL (CANCER CENTER ONLY)
BASO#: 0 10*3/uL (ref 0.0–0.2)
BASO%: 0.3 % (ref 0.0–2.0)
EOS%: 2 % (ref 0.0–7.0)
Eosinophils Absolute: 0.1 10*3/uL (ref 0.0–0.5)
HCT: 35.8 % — ABNORMAL LOW (ref 38.7–49.9)
HGB: 11.8 g/dL — ABNORMAL LOW (ref 13.0–17.1)
LYMPH#: 2.1 10*3/uL (ref 0.9–3.3)
LYMPH%: 34.2 % (ref 14.0–48.0)
MCH: 31.6 pg (ref 28.0–33.4)
MCHC: 33 g/dL (ref 32.0–35.9)
MCV: 96 fL (ref 82–98)
MONO#: 0.5 10*3/uL (ref 0.1–0.9)
MONO%: 7.9 % (ref 0.0–13.0)
NEUT#: 3.4 10*3/uL (ref 1.5–6.5)
NEUT%: 55.6 % (ref 40.0–80.0)
Platelets: 199 10*3/uL (ref 145–400)
RBC: 3.73 10*6/uL — ABNORMAL LOW (ref 4.20–5.70)
RDW: 13.6 % (ref 11.1–15.7)
WBC: 6.1 10*3/uL (ref 4.0–10.0)

## 2014-03-02 LAB — CMP (CANCER CENTER ONLY)
ALT(SGPT): 19 U/L (ref 10–47)
AST: 24 U/L (ref 11–38)
Albumin: 3.8 g/dL (ref 3.3–5.5)
Alkaline Phosphatase: 50 U/L (ref 26–84)
BUN, Bld: 13 mg/dL (ref 7–22)
CO2: 27 mEq/L (ref 18–33)
Calcium: 9.8 mg/dL (ref 8.0–10.3)
Chloride: 100 mEq/L (ref 98–108)
Creat: 1 mg/dl (ref 0.6–1.2)
Glucose, Bld: 84 mg/dL (ref 73–118)
Potassium: 3.1 mEq/L — ABNORMAL LOW (ref 3.3–4.7)
Sodium: 134 mEq/L (ref 128–145)
Total Bilirubin: 1.4 mg/dl (ref 0.20–1.60)
Total Protein: 7.2 g/dL (ref 6.4–8.1)

## 2014-03-02 MED ORDER — ZOLEDRONIC ACID 4 MG/100ML IV SOLN
4.0000 mg | Freq: Once | INTRAVENOUS | Status: AC
  Administered 2014-03-02: 4 mg via INTRAVENOUS
  Filled 2014-03-02: qty 100

## 2014-03-02 MED ORDER — SODIUM CHLORIDE 0.9 % IV SOLN
Freq: Once | INTRAVENOUS | Status: AC
  Administered 2014-03-02: 12:00:00 via INTRAVENOUS

## 2014-03-02 NOTE — Progress Notes (Signed)
[place]  [phone] [phone]  ID: Robert [name] OB: 1933-06-03 MR#: [id number] SAY#:[id number] Patient Care Team: Annia Belt, MD as PCP - General (Internal Medicine)  DIAGNOSIS: Problem List Items Addressed This Visit     Other   Myeloma - Primary   Relevant Orders      CBC with Differential ([facility])      Comprehensive metabolic panel      Kappa/lambda light chains      Serum protein electrophoresis with reflex      IgG, IgA, IgM     INTERVAL HISTORY: Robert [name] is here today for a follow-up. He is doing well. He's had no problems since the last time we saw him. He has gained some weight and we talked about things he could do to lose weight. He denies fever, chills, n/v, cough, rash, headache, dizziness, SOB, chest pain, palpitations, abdominal pain, constipation, diarrhea, blood in urine or stool. No swelling, tenderness, numbness or tingling in his extremities. No bleeding or pain. His appetite is good and he is staying hydrated.  In June, there was no detectable monoclonal spike in his serum. His IgG level was 1270 mg/dL. His Kappa light chain was a 2.67 mg/dL.   CURRENT TREATMENT: Zometa 4 mg IV q. 4 months  REVIEW OF SYSTEMS: All other 10 point review of systems is negative.   PAST MEDICAL HISTORY: Past Medical History  Diagnosis Date  . Multiple myeloma     2006    PAST SURGICAL HISTORY: Past Surgical History  Procedure Laterality Date  . Limbal stem cell transplant     FAMILY HISTORY No family history on file.  GYNECOLOGIC HISTORY:  No LMP for male patient.   SOCIAL HISTORY: History   Social History  . Marital Status: Married    Spouse Name: N/A    Number of Children: N/A  . Years of Education: N/A   Occupational History  . Not on file.   Social History Main Topics  . Smoking status: Former Smoker -- 4.00 packs/day for 9 years    Types: Cigars, Cigarettes    Start date: 07/08/1979    Quit date: 07/07/1988   . Smokeless tobacco: Never Used     Comment: quit 24 years ago  . Alcohol Use: Yes  . Drug Use: No  . Sexual Activity: Not on file   Other Topics Concern  . Not on file   Social History Narrative  . No narrative on file   ADVANCED DIRECTIVES:  <no information>  HEALTH MAINTENANCE: History  Substance Use Topics  . Smoking status: Former Smoker -- 4.00 packs/day for 9 years    Types: Cigars, Cigarettes    Start date: 07/08/1979    Quit date: 07/07/1988  . Smokeless tobacco: Never Used     Comment: quit 24 years ago  . Alcohol Use: Yes   Colonoscopy: PAP: Bone density: Lipid panel:  No Known Allergies  Current Outpatient Prescriptions  Medication Sig Dispense Refill  . aspirin EC 81 MG tablet Take 81 mg by mouth daily.      Marland Kitchen atenolol (TENORMIN) 50 MG tablet Take 50 mg by mouth daily.      . bimatoprost (LUMIGAN) 0.03 % ophthalmic solution Place 1 drop into both eyes at bedtime.      . Calcium Carbonate-Vit D-Min (CALTRATE 600+D PLUS PO) Take 1 tablet by mouth daily.      . Cholecalciferol (VITAMIN D3) 1000 UNITS CAPS Take 1 capsule by mouth daily.      Marland Kitchen  cyanocobalamin 2000 MCG tablet Take 2,000 mcg by mouth daily.      . famciclovir (FAMVIR) 250 MG tablet Take 1 tablet (250 mg total) by mouth daily.  90 tablet  4  . fluticasone (FLONASE) 50 MCG/ACT nasal spray 2 sprays. Place 2 sprays into both nostrils daily.      . Fluticasone-Salmeterol (ADVAIR DISKUS IN) Inhale into the lungs as needed.      . hydrochlorothiazide (HYDRODIURIL) 25 MG tablet Take 25 mg by mouth daily.      Marland Kitchen losartan (COZAAR) 50 MG tablet Take 50 mg by mouth daily.      . Multiple Vitamin (MULITIVITAMIN WITH MINERALS) TABS Take 1 tablet by mouth daily.      . potassium chloride (KLOR-CON) 20 MEQ packet Take 20 mEq by mouth daily.      . pravastatin (PRAVACHOL) 20 MG tablet Take 20 mg by mouth at bedtime.      . Zoledronic Acid (ZOMETA) 4 MG/100ML IVPB Inject 4 mg into the vein. Every 4 months.        No current facility-administered medications for this visit.   Facility-Administered Medications Ordered in Other Visits  Medication Dose Route Frequency Provider Last Rate Last Dose  . zolendronic acid (ZOMETA) 4 mg in sodium chloride 0.9 % 100 mL IVPB  4 mg Intravenous Once Volanda Napoleon, MD       OBJECTIVE: Filed Vitals:   03/02/14 1032  BP: 185/98  Pulse: 51  Temp: 98.1 F (36.7 C)  Resp: 18   ECOG FS:0 - Asymptomatic Ocular: Sclerae unicteric, pupils equal, round and reactive to light Ear-nose-throat: Oropharynx clear, dentition fair Lymphatic: No cervical or supraclavicular adenopathy Lungs no rales or rhonchi, good excursion bilaterally Heart regular rate and rhythm, no murmur appreciated Abd soft, nontender, positive bowel sounds MSK no focal spinal tenderness, no joint edema Neuro: non-focal, well-oriented, appropriate affect  LAB RESULTS: CMP     Component Value Date/Time   NA 137 11/03/2013 0940   NA 138 07/07/2013 1004   K 3.1* 11/03/2013 0940   K 4.1 07/07/2013 1004   CL 99 11/03/2013 0940   CL 103 07/07/2013 1004   CO2 29 11/03/2013 0940   CO2 24 07/07/2013 1004   GLUCOSE 82 11/03/2013 0940   GLUCOSE 73 07/07/2013 1004   BUN 13 11/03/2013 0940   BUN 13 07/07/2013 1004   CREATININE 1.1 11/03/2013 0940   CREATININE 1.11 07/07/2013 1004   CALCIUM 9.6 11/03/2013 0940   CALCIUM 9.8 07/07/2013 1004   PROT 7.8 11/03/2013 0940   PROT 7.4 07/07/2013 1004   ALBUMIN 4.2 07/07/2013 1004   AST 22 11/03/2013 0940   AST 22 07/07/2013 1004   ALT 17 11/03/2013 0940   ALT 18 07/07/2013 1004   ALKPHOS 55 11/03/2013 0940   ALKPHOS 57 07/07/2013 1004   BILITOT 1.30 11/03/2013 0940   BILITOT 0.9 07/07/2013 1004   GFRNONAA 79* 04/14/2012 0130   GFRAA >90 04/14/2012 0130   No results found for this basename: SPEP, UPEP,  kappa and lambda light chains   Lab Results  Component Value Date   WBC 6.1 03/02/2014   NEUTROABS 3.4 03/02/2014   HGB 11.8* 03/02/2014   HCT 35.8* 03/02/2014   MCV  96 03/02/2014   PLT 199 03/02/2014   No results found for this basename: LABCA2   No components found with this basename: [id number]   No results found for this basename: INR,  in the last 168 hours Urinalysis No results found  for this basename: colorurine, appearanceur, labspec, phurine, glucoseu, hgbur, bilirubinur, ketonesur, proteinur, urobilinogen, nitrite, leukocytesur   STUDIES: No results found.  ASSESSMENT/PLAN: Robert [name] is 80 year old gentleman with IgG Kappa myeloma. He underwent stem cell transplant back in 2006. He has been in remission since.  He is doing well with the Zometa and will have his dose today as planned.  We will see back in 6 months for labs, follow-up and treatment. He is going to be under 200 lbs by then.  He knows to call here with any questions or concerns and to go to the ED in the event of an emergency. We can certainly see him sooner if need be.    Eliezer Bottom, NP 03/02/2014 11:20 AM

## 2014-03-02 NOTE — Patient Instructions (Signed)

## 2014-03-06 LAB — IGG, IGA, IGM
IgA: 159 mg/dL (ref 68–379)
IgG (Immunoglobin G), Serum: 1290 mg/dL (ref 650–1600)
IgM, Serum: 208 mg/dL (ref 41–251)

## 2014-03-06 LAB — PROTEIN ELECTROPHORESIS, SERUM, WITH REFLEX
Albumin ELP: 54.1 % — ABNORMAL LOW (ref 55.8–66.1)
Alpha-1-Globulin: 7.3 % — ABNORMAL HIGH (ref 2.9–4.9)
Alpha-2-Globulin: 9.5 % (ref 7.1–11.8)
Beta 2: 3.9 % (ref 3.2–6.5)
Beta Globulin: 6.2 % (ref 4.7–7.2)
Gamma Globulin: 19 % — ABNORMAL HIGH (ref 11.1–18.8)
Total Protein, Serum Electrophoresis: 7.1 g/dL (ref 6.0–8.3)

## 2014-03-06 LAB — KAPPA/LAMBDA LIGHT CHAINS
Kappa free light chain: 2.68 mg/dL — ABNORMAL HIGH (ref 0.33–1.94)
Kappa:Lambda Ratio: 1.43 (ref 0.26–1.65)
Lambda Free Lght Chn: 1.88 mg/dL (ref 0.57–2.63)

## 2014-03-06 LAB — IFE INTERPRETATION

## 2014-03-07 ENCOUNTER — Encounter: Payer: Self-pay | Admitting: *Deleted

## 2014-06-08 ENCOUNTER — Ambulatory Visit: Payer: Self-pay | Admitting: Podiatry

## 2014-06-12 ENCOUNTER — Ambulatory Visit (INDEPENDENT_AMBULATORY_CARE_PROVIDER_SITE_OTHER): Payer: Medicare Other | Admitting: Podiatrist

## 2014-06-12 ENCOUNTER — Encounter: Payer: Self-pay | Admitting: Podiatrist

## 2014-06-12 VITALS — BP 69/44 | HR 42 | Resp 11 | Ht 71.0 in | Wt 215.0 lb

## 2014-06-12 DIAGNOSIS — L03032 Cellulitis of left toe: Secondary | ICD-10-CM

## 2014-06-12 DIAGNOSIS — L84 Corns and callosities: Secondary | ICD-10-CM

## 2014-06-12 DIAGNOSIS — L03012 Cellulitis of left finger: Secondary | ICD-10-CM

## 2014-06-12 NOTE — Patient Instructions (Signed)
ANTIBACTERIAL SOAP INSTRUCTIONS  THE DAY AFTER PROCEDURE   For the first dressing change (soak) Place 3-4 drops of antibacterial liquid soap in a quart of warm tap water.  Submerge foot into water for 20 minutes.  If bandage was applied after your procedure, leave on to allow for easy lift off, then remove and continue with soak for the remaining time.  Next, blot area dry with a soft cloth and apply antibiotic ointment such as polysporin, neosporin, or triple antibiotic ointment.  You may then switch to the instructions below    Shower as usual. Before getting out, place a drop of antibacterial liquid soap (Dial) on a wet, clean washcloth.  Gently wipe washcloth over affected area.  Afterward, rinse the area with warm water.  Blot the area dry with a soft cloth and cover with antibiotic ointment (neosporin, polysporin, bacitracin) and band aid or gauze and tape       

## 2014-06-12 NOTE — Progress Notes (Signed)
   Subjective:    Patient ID: Robert [name], male    DOB: [birth year], 81 y.o.   MRN: [id number]  HPI Comments: Pt states has painful left 1st medial toenail border, began after taking chemotherapy 2006.  Pt states the hard painful callous began about the same time as the chemotherapy.     Review of Systems  HENT: Positive for congestion and sinus pressure.   All other systems reviewed and are negative.      Objective:   Physical Exam  Patient is awake, alert, and oriented x 3.  In no acute distress.  Vascular status is intact with palpable pedal pulses at 2/4 DP and PT bilateral and capillary refill time within normal limits. Neurological sensation is also intact bilaterally via Semmes Weinstein monofilament at 5/5 sites. Light touch, vibratory sensation, Achilles tendon reflex is intact. Dermatological exam reveals hard callus submetatarsal 5 the left foot. It is painful with direct pressure. Skin tension lines are noted throughout. Left hallux has inflammation and swelling along the medial aspect of the toe. Mild redness and irritation is present. Pain with direct pressure is also noted.   Musculature intact with dorsiflexion, plantarflexion, inversion, eversion.     Assessment & Plan:  #1 paronychia left first medial aspect #2 callus sub-metatarsal 5 left  Plan: Treatment options and alternatives discussed.  Recommended an incision and drainage of the toe and patient agreed.  Left hallux was prepped with alcohol and a 1 to 1 mix of 0.5% marcaine plain and 2% lidocaine plain was administered in a digital block fashion.  The toe was then prepped with betadine solution and exsanguinated.  The offending medial nail border was then excised and all necrotic tissue was resected.  The area was then cleansed well and antibiotic ointment and a dry sterile dressing was applied.  The patient was dispensed instructions for aftercare.  A debridement of the callus was also accomplished today with a  number 15 blade.

## 2014-07-30 ENCOUNTER — Other Ambulatory Visit: Payer: Self-pay | Admitting: Hematology & Oncology

## 2014-08-15 ENCOUNTER — Telehealth: Payer: Self-pay | Admitting: Hematology & Oncology

## 2014-08-15 NOTE — Telephone Encounter (Signed)
Left message moved 4-8 to 4-7

## 2014-08-30 ENCOUNTER — Ambulatory Visit (HOSPITAL_BASED_OUTPATIENT_CLINIC_OR_DEPARTMENT_OTHER): Payer: Medicare Other

## 2014-08-30 ENCOUNTER — Encounter: Payer: Self-pay | Admitting: Family

## 2014-08-30 ENCOUNTER — Ambulatory Visit (HOSPITAL_BASED_OUTPATIENT_CLINIC_OR_DEPARTMENT_OTHER): Payer: Medicare Other | Admitting: Family

## 2014-08-30 VITALS — BP 196/91 | HR 92 | Temp 97.5°F | Resp 53 | Ht 70.0 in | Wt 213.0 lb

## 2014-08-30 DIAGNOSIS — C9001 Multiple myeloma in remission: Secondary | ICD-10-CM

## 2014-08-30 DIAGNOSIS — C9000 Multiple myeloma not having achieved remission: Secondary | ICD-10-CM

## 2014-08-30 LAB — CMP (CANCER CENTER ONLY)
ALT(SGPT): 20 U/L (ref 10–47)
AST: 25 U/L (ref 11–38)
Albumin: 3.7 g/dL (ref 3.3–5.5)
Alkaline Phosphatase: 47 U/L (ref 26–84)
BUN, Bld: 13 mg/dL (ref 7–22)
CO2: 27 mEq/L (ref 18–33)
Calcium: 9.4 mg/dL (ref 8.0–10.3)
Chloride: 101 mEq/L (ref 98–108)
Creat: 1.2 mg/dl (ref 0.6–1.2)
Glucose, Bld: 89 mg/dL (ref 73–118)
Potassium: 4 mEq/L (ref 3.3–4.7)
Sodium: 141 mEq/L (ref 128–145)
Total Bilirubin: 1.2 mg/dl (ref 0.20–1.60)
Total Protein: 7.5 g/dL (ref 6.4–8.1)

## 2014-08-30 LAB — CBC WITH DIFFERENTIAL (CANCER CENTER ONLY)
BASO#: 0 10*3/uL (ref 0.0–0.2)
BASO%: 0.2 % (ref 0.0–2.0)
EOS%: 1.5 % (ref 0.0–7.0)
Eosinophils Absolute: 0.1 10*3/uL (ref 0.0–0.5)
HCT: 37 % — ABNORMAL LOW (ref 38.7–49.9)
HGB: 12.1 g/dL — ABNORMAL LOW (ref 13.0–17.1)
LYMPH#: 2.2 10*3/uL (ref 0.9–3.3)
LYMPH%: 37.5 % (ref 14.0–48.0)
MCH: 31.7 pg (ref 28.0–33.4)
MCHC: 32.7 g/dL (ref 32.0–35.9)
MCV: 97 fL (ref 82–98)
MONO#: 0.5 10*3/uL (ref 0.1–0.9)
MONO%: 8 % (ref 0.0–13.0)
NEUT#: 3.1 10*3/uL (ref 1.5–6.5)
NEUT%: 52.8 % (ref 40.0–80.0)
Platelets: 184 10*3/uL (ref 145–400)
RBC: 3.82 10*6/uL — ABNORMAL LOW (ref 4.20–5.70)
RDW: 13.3 % (ref 11.1–15.7)
WBC: 5.8 10*3/uL (ref 4.0–10.0)

## 2014-08-30 MED ORDER — ZOLEDRONIC ACID 4 MG/100ML IV SOLN
4.0000 mg | Freq: Once | INTRAVENOUS | Status: AC
  Administered 2014-08-30: 4 mg via INTRAVENOUS
  Filled 2014-08-30: qty 100

## 2014-08-30 MED ORDER — SODIUM CHLORIDE 0.9 % IV SOLN
Freq: Once | INTRAVENOUS | Status: AC
  Administered 2014-08-30: 12:00:00 via INTRAVENOUS

## 2014-08-30 NOTE — Progress Notes (Signed)
Hematology and Oncology Follow Up Visit  Robert [name] [id number] [birth year] 81 y.o. 08/30/2014   Principle Diagnosis:  IgG Kappa Myeloma-remission  Current Therapy:   Zometa 4 mg IV q. 4 months    Interim History: [name] is here today for a follow-up. He is doing well. He's had no problems since the last time we saw him. He denies fever, chills, n/v, cough, rash, headache, dizziness, SOB, chest pain, palpitations, abdominal pain, constipation, diarrhea, blood in urine or stool.  No swelling, tenderness, numbness or tingling in his extremities. No bleeding or pain. 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 This is not a new issue and seems to come and go.  His appetite is good and he is staying hydrated. His weight is stable. In October, there was no detectable monoclonal spike in his serum. His IgG level was 1290 mg/dL. His Kappa light chain was a 2.68 mg/dL.  He is seen yearly at [facility] and went in February. He had mildly elevated kappa light chains and recommend having his myeloma markers checked every 3-4 months.   Medications:    Medication List       This list is accurate as of: 08/30/14 11:30 AM.  Always use your most recent med list.               ADVAIR DISKUS IN  Inhale into the lungs as needed.     aspirin EC 81 MG tablet  Take 81 mg by mouth daily.     atenolol 50 MG tablet  Commonly known as:  TENORMIN  Take 50 mg by mouth daily.     CALTRATE 600+D PLUS PO  Take 1 tablet by mouth daily.     cyanocobalamin 2000 MCG tablet  Take 2,000 mcg by mouth daily.     famciclovir 250 MG tablet  Commonly known as:  FAMVIR  TAKE 1 TABLET DAILY     fluticasone 50 MCG/ACT nasal spray  Commonly known as:  FLONASE  2 sprays. Place 2 sprays into both nostrils daily.     hydrochlorothiazide 25 MG tablet  Commonly known as:  HYDRODIURIL  Take 25 mg by mouth daily.     losartan 50 MG tablet  Commonly known as:  COZAAR  Take 50 mg by mouth daily.     multivitamin with minerals Tabs tablet  Take 1 tablet by mouth daily.     potassium chloride 20 MEQ packet  Commonly known as:  KLOR-CON  Take 20 mEq by mouth daily.     pravastatin 20 MG tablet  Commonly known as:  PRAVACHOL  Take 20 mg by mouth at bedtime.     Vitamin D3 1000 UNITS Caps  Take 1 capsule by mouth daily.     ZOMETA 4 MG/100ML IVPB  Generic drug:  Zoledronic Acid  Inject 4 mg into the vein. Every 4 months.        Allergies: No Known Allergies  Past Medical History, Surgical history, Social history, and Family History were reviewed and updated.  Review of Systems: All other 10 point review of systems is negative.   Physical Exam:  height is 5\' 10"  (1.778 m) and weight is 213 lb (96.616 kg). His oral temperature is 97.5 F (36.4 C). His blood pressure is 192/95 and his pulse is 92. His respiration is 18.   Wt Readings from Last 3 Encounters:  08/30/14 213 lb (96.616 kg)  06/12/14 215 lb (97.523 kg)  03/02/14 214 lb (97.07 kg)  Ocular: Sclerae unicteric, pupils equal, round and reactive to light Ear-nose-throat: Oropharynx clear, dentition fair Lymphatic: No cervical or supraclavicular adenopathy Lungs no rales or rhonchi, good excursion bilaterally Heart regular rate and rhythm, no murmur appreciated Abd soft, nontender, positive bowel sounds MSK no focal spinal tenderness, no joint edema Neuro: non-focal, well-oriented, appropriate affect  Lab Results  Component Value Date   WBC 5.8 08/30/2014   HGB 12.1* 08/30/2014   HCT 37.0* 08/30/2014   MCV 97 08/30/2014   PLT 184 08/30/2014   Lab Results  Component Value Date   FERRITIN 161 07/02/2011   IRON 70 07/02/2011   TIBC 364 07/02/2011   UIBC 294 07/02/2011   IRONPCTSAT 19* 07/02/2011   Lab Results  Component Value Date   RBC 3.82* 08/30/2014   Lab Results  Component Value Date   KPAFRELGTCHN 2.68* 03/02/2014   LAMBDASER 1.88 03/02/2014   KAPLAMBRATIO 1.43 03/02/2014   Lab Results    Component Value Date   IGGSERUM 1290 03/02/2014   IGA 159 03/02/2014   IGMSERUM 208 03/02/2014   Lab Results  Component Value Date   TOTALPROTELP 7.1 03/02/2014   ALBUMINELP 54.1* 03/02/2014   A1GS 7.3* 03/02/2014   A2GS 9.5 03/02/2014   BETS 6.2 03/02/2014   BETA2SER 3.9 03/02/2014   GAMS 19.0* 03/02/2014   MSPIKE NOT DET 03/02/2014   SPEI * 03/02/2014     Chemistry      Component Value Date/Time   NA 134 03/02/2014 1011   NA 138 07/07/2013 1004   K 3.1* 03/02/2014 1011   K 4.1 07/07/2013 1004   CL 100 03/02/2014 1011   CL 103 07/07/2013 1004   CO2 27 03/02/2014 1011   CO2 24 07/07/2013 1004   BUN 13 03/02/2014 1011   BUN 13 07/07/2013 1004   CREATININE 1.0 03/02/2014 1011   CREATININE 1.11 07/07/2013 1004      Component Value Date/Time   CALCIUM 9.8 03/02/2014 1011   CALCIUM 9.8 07/07/2013 1004   ALKPHOS 50 03/02/2014 1011   ALKPHOS 57 07/07/2013 1004   AST 24 03/02/2014 1011   AST 22 07/07/2013 1004   ALT 19 03/02/2014 1011   ALT 18 07/07/2013 1004   BILITOT 1.40 03/02/2014 1011   BILITOT 0.9 07/07/2013 1004     Impression and Plan: [name] is 81 year old gentleman with IgG Kappa myeloma. He underwent stem cell transplant back in 2006 and has been in remission since.  He is doing well with the Zometa and will have his dose today as planned.  We will see back in 4 months for labs, follow-up and treatment. He knows to call here with any questions or concerns and to go to the ED in the event of an emergency. We can certainly see him sooner if need be.   Eliezer Bottom, NP 4/7/201611:30 AM

## 2014-08-30 NOTE — Patient Instructions (Signed)

## 2014-08-31 ENCOUNTER — Ambulatory Visit: Payer: Medicare Other | Admitting: Family

## 2014-08-31 ENCOUNTER — Ambulatory Visit: Payer: Medicare Other

## 2014-08-31 ENCOUNTER — Other Ambulatory Visit: Payer: Medicare Other | Admitting: Lab

## 2014-09-03 LAB — IFE INTERPRETATION

## 2014-09-03 LAB — PROTEIN ELECTROPHORESIS, SERUM, WITH REFLEX
Albumin ELP: 4 g/dL (ref 3.8–4.8)
Alpha-1-Globulin: 0.3 g/dL (ref 0.2–0.3)
Alpha-2-Globulin: 0.7 g/dL (ref 0.5–0.9)
Beta 2: 0.4 g/dL (ref 0.2–0.5)
Beta Globulin: 0.4 g/dL (ref 0.4–0.6)
Gamma Globulin: 1.4 g/dL (ref 0.8–1.7)
Total Protein, Serum Electrophoresis: 7.2 g/dL (ref 6.1–8.1)

## 2014-09-03 LAB — IGG, IGA, IGM
IgA: 164 mg/dL (ref 68–379)
IgG (Immunoglobin G), Serum: 1320 mg/dL (ref 650–1600)
IgM, Serum: 197 mg/dL (ref 41–251)

## 2014-09-03 LAB — KAPPA/LAMBDA LIGHT CHAINS
Kappa free light chain: 2.81 mg/dL — ABNORMAL HIGH (ref 0.33–1.94)
Kappa:Lambda Ratio: 1.71 — ABNORMAL HIGH (ref 0.26–1.65)
Lambda Free Lght Chn: 1.64 mg/dL (ref 0.57–2.63)

## 2014-09-17 ENCOUNTER — Emergency Department (HOSPITAL_BASED_OUTPATIENT_CLINIC_OR_DEPARTMENT_OTHER)
Admission: EM | Admit: 2014-09-17 | Discharge: 2014-09-17 | Disposition: A | Discharge status: Home / Self Care | Payer: Medicare Other | Attending: Emergency Medicine | Admitting: Emergency Medicine

## 2014-09-17 ENCOUNTER — Emergency Department (HOSPITAL_BASED_OUTPATIENT_CLINIC_OR_DEPARTMENT_OTHER): Payer: Medicare Other

## 2014-09-17 ENCOUNTER — Encounter (HOSPITAL_BASED_OUTPATIENT_CLINIC_OR_DEPARTMENT_OTHER): Payer: Self-pay | Admitting: Emergency Medicine

## 2014-09-17 DIAGNOSIS — K439 Ventral hernia without obstruction or gangrene: Secondary | ICD-10-CM | POA: Insufficient documentation

## 2014-09-17 DIAGNOSIS — Z7982 Long term (current) use of aspirin: Secondary | ICD-10-CM | POA: Insufficient documentation

## 2014-09-17 DIAGNOSIS — K625 Hemorrhage of anus and rectum: Secondary | ICD-10-CM | POA: Diagnosis not present

## 2014-09-17 DIAGNOSIS — Z87891 Personal history of nicotine dependence: Secondary | ICD-10-CM | POA: Insufficient documentation

## 2014-09-17 DIAGNOSIS — Z79899 Other long term (current) drug therapy: Secondary | ICD-10-CM | POA: Insufficient documentation

## 2014-09-17 DIAGNOSIS — I10 Essential (primary) hypertension: Secondary | ICD-10-CM | POA: Diagnosis not present

## 2014-09-17 DIAGNOSIS — E785 Hyperlipidemia, unspecified: Secondary | ICD-10-CM | POA: Diagnosis not present

## 2014-09-17 DIAGNOSIS — Z7951 Long term (current) use of inhaled steroids: Secondary | ICD-10-CM | POA: Insufficient documentation

## 2014-09-17 DIAGNOSIS — Z8589 Personal history of malignant neoplasm of other organs and systems: Secondary | ICD-10-CM | POA: Insufficient documentation

## 2014-09-17 LAB — CBC WITH DIFFERENTIAL/PLATELET
Basophils Absolute: 0 10*3/uL (ref 0.0–0.1)
Basophils Relative: 0 % (ref 0–1)
Eosinophils Absolute: 0.1 10*3/uL (ref 0.0–0.7)
Eosinophils Relative: 1 % (ref 0–5)
HCT: 40.2 % (ref 39.0–52.0)
Hemoglobin: 13.4 g/dL (ref 13.0–17.0)
Lymphocytes Relative: 30 % (ref 12–46)
Lymphs Abs: 1.8 10*3/uL (ref 0.7–4.0)
MCH: 31.8 pg (ref 26.0–34.0)
MCHC: 33.3 g/dL (ref 30.0–36.0)
MCV: 95.5 fL (ref 78.0–100.0)
Monocytes Absolute: 0.4 10*3/uL (ref 0.1–1.0)
Monocytes Relative: 7 % (ref 3–12)
Neutro Abs: 3.8 10*3/uL (ref 1.7–7.7)
Neutrophils Relative %: 62 % (ref 43–77)
Platelets: 217 10*3/uL (ref 150–400)
RBC: 4.21 MIL/uL — ABNORMAL LOW (ref 4.22–5.81)
RDW: 13 % (ref 11.5–15.5)
WBC: 6.1 10*3/uL (ref 4.0–10.5)

## 2014-09-17 LAB — COMPREHENSIVE METABOLIC PANEL
ALT: 16 U/L (ref 0–53)
AST: 23 U/L (ref 0–37)
Albumin: 4.1 g/dL (ref 3.5–5.2)
Alkaline Phosphatase: 58 U/L (ref 39–117)
Anion gap: 6 (ref 5–15)
BUN: 19 mg/dL (ref 6–23)
CO2: 30 mmol/L (ref 19–32)
Calcium: 9.9 mg/dL (ref 8.4–10.5)
Chloride: 104 mmol/L (ref 96–112)
Creatinine, Ser: 1.18 mg/dL (ref 0.50–1.35)
GFR calc Af Amer: 66 mL/min — ABNORMAL LOW (ref >90)
GFR calc non Af Amer: 57 mL/min — ABNORMAL LOW (ref >90)
Glucose, Bld: 104 mg/dL — ABNORMAL HIGH (ref 70–99)
Potassium: 4 mmol/L (ref 3.5–5.1)
Sodium: 140 mmol/L (ref 135–145)
Total Bilirubin: 1 mg/dL (ref 0.3–1.2)
Total Protein: 7.8 g/dL (ref 6.0–8.3)

## 2014-09-17 LAB — OCCULT BLOOD X 1 CARD TO LAB, STOOL: Fecal Occult Bld: POSITIVE — AB

## 2014-09-17 LAB — LIPASE, BLOOD: Lipase: 26 U/L (ref 11–59)

## 2014-09-17 MED ORDER — SODIUM CHLORIDE 0.9 % IV BOLUS (SEPSIS)
500.0000 mL | Freq: Once | INTRAVENOUS | Status: AC
  Administered 2014-09-17: 500 mL via INTRAVENOUS

## 2014-09-17 MED ORDER — ONDANSETRON HCL 4 MG/2ML IJ SOLN
4.0000 mg | Freq: Once | INTRAMUSCULAR | Status: AC
  Administered 2014-09-17: 4 mg via INTRAVENOUS

## 2014-09-17 MED ORDER — SODIUM CHLORIDE 0.9 % IV SOLN: INTRAVENOUS | Status: DC

## 2014-09-17 MED ORDER — IOHEXOL 300 MG/ML  SOLN
100.0000 mL | Freq: Once | INTRAMUSCULAR | Status: AC | PRN
  Administered 2014-09-17: 100 mL via INTRAVENOUS

## 2014-09-17 MED ORDER — IOHEXOL 300 MG/ML  SOLN
25.0000 mL | Freq: Once | INTRAMUSCULAR | Status: AC | PRN
  Administered 2014-09-17: 25 mL via ORAL

## 2014-09-17 NOTE — Discharge Instructions (Signed)
Bloody Stools Bloody stools means there is blood in your poop (stool). It is a sign that there is a problem somewhere in the digestive system. It is important for your doctor to find the cause of your bleeding, so the problem can be treated.  HOME CARE  Only take medicine as told by your doctor.  Eat foods with fiber (prunes, bran cereals).  Drink enough fluids to keep your pee (urine) clear or pale yellow.  Sit in warm water (sitz bath) for 10 to 15 minutes as told by your doctor.  Know how to take your medicines (enemas, suppositories) if advised by your doctor.  Watch for signs that you are getting better or getting worse. GET HELP RIGHT AWAY IF:   You are not getting better.  You start to get better but then get worse again.  You have new problems.  You have severe bleeding from the place where poop comes out (rectum) that does not stop.  You throw up (vomit) blood.  You feel weak or pass out (faint).  You have a fever. MAKE SURE YOU:   Understand these instructions.  Will watch your condition.  Will get help right away if you are not doing well or get worse. Document Released: 04/29/2009 Document Revised: 08/03/2011 Document Reviewed: 09/26/2010 [facility] Patient Information 2015 [place], [place]. This information is not intended to replace advice given to you by your health care provider. Make sure you discuss any questions you have with your health care provider.  Make arrangements to have colonoscopy done. CT scan here today without any significant findings. Return for any increased bleeding particularly any staining of the commode water all red 3 in a day. Also return for any lightheadedness or feeling like you pass out or for any significant abdominal pain. Follow-up with your regular doctor as well.

## 2014-09-17 NOTE — ED Notes (Signed)
Pt returned from CT °

## 2014-09-17 NOTE — ED Provider Notes (Signed)
CSN: [id number]     Arrival date & time 09/17/14  5732 History   First MD Initiated Contact with Patient 09/17/14 [phone]     Chief Complaint  Patient presents with  . Rectal Bleeding     (Consider location/radiation/quality/duration/timing/severity/associated sxs/prior Treatment) Patient is a 81 y.o. male presenting with hematochezia. The history is provided by the patient.  Rectal Bleeding Associated symptoms: no abdominal pain, no fever and no vomiting    Patient was some rectal bleeding yesterday it was just bright red blood on the toilet paper and occasionally a spot on his underwear really no staining of the commode water red. Patient only a streak in the bowel movements. No significant bleeding today. No abdominal pain no nausea no vomiting no history of similar bleeding. Patient is not on any blood thinners. Patient has a history of multiple myeloma followed at the [facility] for that. That's been very stable. Patient has not had a colonoscopy. Past Medical History  Diagnosis Date  . Multiple myeloma     2006  . Hyperlipidemia   . Hypertension    Past Surgical History  Procedure Laterality Date  . Limbal stem cell transplant     No family history on file. History  Substance Use Topics  . Smoking status: Former Smoker -- 4.00 packs/day for 9 years    Types: Cigars, Cigarettes    Start date: 07/08/1979    Quit date: 07/07/1988  . Smokeless tobacco: Never Used     Comment: quit 24 years ago  . Alcohol Use: 0.0 oz/week    0 Standard drinks or equivalent per week    Review of Systems  Constitutional: Negative for fever.  HENT: Negative for congestion.   Eyes: Negative for redness.  Respiratory: Negative for shortness of breath.   Cardiovascular: Negative for chest pain.  Gastrointestinal: Positive for hematochezia and anal bleeding. Negative for nausea, vomiting, abdominal pain and rectal pain.  Genitourinary: Negative for dysuria.  Musculoskeletal: Negative for back pain.   Skin: Negative for rash.  Neurological: Negative for headaches.  Hematological: Does not bruise/bleed easily.  Psychiatric/Behavioral: Negative for confusion.      Allergies  Review of patient's allergies indicates no known allergies.  Home Medications   Prior to Admission medications   Medication Sig Start Date End Date Taking? Authorizing Provider  aspirin EC 81 MG tablet Take 81 mg by mouth daily.    Historical Provider, MD  atenolol (TENORMIN) 50 MG tablet Take 50 mg by mouth daily.    Historical Provider, MD  Calcium Carbonate-Vit D-Min (CALTRATE 600+D PLUS PO) Take 1 tablet by mouth daily.    Historical Provider, MD  Cholecalciferol (VITAMIN D3) 1000 UNITS CAPS Take 1 capsule by mouth daily.    Historical Provider, MD  cyanocobalamin 2000 MCG tablet Take 2,000 mcg by mouth daily.    Historical Provider, MD  famciclovir (FAMVIR) 250 MG tablet TAKE 1 TABLET DAILY 07/30/14   Volanda Napoleon, MD  fluticasone [facility]) 50 MCG/ACT nasal spray 2 sprays. Place 2 sprays into both nostrils daily.    Historical Provider, MD  Fluticasone-Salmeterol (ADVAIR DISKUS IN) Inhale into the lungs as needed.    Historical Provider, MD  hydrochlorothiazide (HYDRODIURIL) 25 MG tablet Take 25 mg by mouth daily.    Historical Provider, MD  losartan (COZAAR) 50 MG tablet Take 50 mg by mouth daily.    Historical Provider, MD  Multiple Vitamin (MULITIVITAMIN WITH MINERALS) TABS Take 1 tablet by mouth daily.    Historical Provider, MD  potassium chloride (KLOR-CON) 20 MEQ packet Take 20 mEq by mouth daily.    Historical Provider, MD  pravastatin (PRAVACHOL) 20 MG tablet Take 20 mg by mouth at bedtime.    Historical Provider, MD  Zoledronic Acid (ZOMETA) 4 MG/100ML IVPB Inject 4 mg into the vein. Every 4 months.    Historical Provider, MD   BP 175/79 mmHg  Pulse 51  Temp(Src) 97.6 F (36.4 C) (Oral)  Resp 18  Ht 5' (1.524 m)  Wt 213 lb (96.616 kg)  BMI 41.60 kg/m2  SpO2 99% Physical Exam   Constitutional: He is oriented to person, place, and time. He appears well-developed and well-nourished. No distress.  HENT:  Head: Normocephalic and atraumatic.  Eyes: Conjunctivae and EOM are normal. Pupils are equal, round, and reactive to light.  Neck: Normal range of motion. Neck supple.  Cardiovascular: Normal rate, regular rhythm and normal heart sounds.   No murmur heard. Pulmonary/Chest: Effort normal and breath sounds normal. No respiratory distress.  Abdominal: Soft. Bowel sounds are normal. There is no tenderness.  Genitourinary: Guaiac positive stool.  Musculoskeletal: Normal range of motion.  Neurological: He is alert and oriented to person, place, and time. No cranial nerve deficit. He exhibits normal muscle tone. Coordination normal.  Skin: Skin is warm. No rash noted.  Nursing note and vitals reviewed.   ED Course  Procedures (including critical care time) Labs Review Labs Reviewed  COMPREHENSIVE METABOLIC PANEL - Abnormal; Notable for the following:    Glucose, Bld 104 (*)    GFR calc non Af Amer 57 (*)    GFR calc Af Amer 66 (*)    All other components within normal limits  CBC WITH DIFFERENTIAL/PLATELET - Abnormal; Notable for the following:    RBC 4.21 (*)    All other components within normal limits  OCCULT BLOOD X 1 CARD TO LAB, STOOL - Abnormal; Notable for the following:    Fecal Occult Bld POSITIVE (*)    All other components within normal limits  LIPASE, BLOOD   Results for orders placed or performed during the hospital encounter of 09/17/14  Comprehensive metabolic panel  Result Value Ref Range   Sodium 140 135 - 145 mmol/L   Potassium 4.0 3.5 - 5.1 mmol/L   Chloride 104 96 - 112 mmol/L   CO2 30 19 - 32 mmol/L   Glucose, Bld 104 (H) 70 - 99 mg/dL   BUN 19 6 - 23 mg/dL   Creatinine, Ser 1.18 0.50 - 1.35 mg/dL   Calcium 9.9 8.4 - 10.5 mg/dL   Total Protein 7.8 6.0 - 8.3 g/dL   Albumin 4.1 3.5 - 5.2 g/dL   AST 23 0 - 37 U/L   ALT 16 0 - 53 U/L    Alkaline Phosphatase 58 39 - 117 U/L   Total Bilirubin 1.0 0.3 - 1.2 mg/dL   GFR calc non Af Amer 57 (L) >90 mL/min   GFR calc Af Amer 66 (L) >90 mL/min   Anion gap 6 5 - 15  Lipase, blood  Result Value Ref Range   Lipase 26 11 - 59 U/L  CBC with Differential/Platelet  Result Value Ref Range   WBC 6.1 4.0 - 10.5 K/uL   RBC 4.21 (L) 4.22 - 5.81 MIL/uL   Hemoglobin 13.4 13.0 - 17.0 g/dL   HCT 40.2 39.0 - 52.0 %   MCV 95.5 78.0 - 100.0 fL   MCH 31.8 26.0 - 34.0 pg   MCHC 33.3 30.0 - 36.0  g/dL   RDW 13.0 11.5 - 15.5 %   Platelets 217 150 - 400 K/uL   Neutrophils Relative % 62 43 - 77 %   Neutro Abs 3.8 1.7 - 7.7 K/uL   Lymphocytes Relative 30 12 - 46 %   Lymphs Abs 1.8 0.7 - 4.0 K/uL   Monocytes Relative 7 3 - 12 %   Monocytes Absolute 0.4 0.1 - 1.0 K/uL   Eosinophils Relative 1 0 - 5 %   Eosinophils Absolute 0.1 0.0 - 0.7 K/uL   Basophils Relative 0 0 - 1 %   Basophils Absolute 0.0 0.0 - 0.1 K/uL  Occult blood card to lab, stool Provider will collect  Result Value Ref Range   Fecal Occult Bld POSITIVE (A) NEGATIVE     Imaging Review Ct Abdomen Pelvis W Contrast  09/17/2014   CLINICAL DATA:  One day history of rectal bleeding. History of multiple myeloma. Hypertension.  EXAM: CT ABDOMEN AND PELVIS WITH CONTRAST  TECHNIQUE: Multidetector CT imaging of the abdomen and pelvis was performed using the standard protocol following bolus administration of intravenous contrast. Oral contrast was also administered.  CONTRAST:  21m OMNIPAQUE IOHEXOL 300 MG/ML SOLN, 1020mOMNIPAQUE IOHEXOL 300 MG/ML SOLN  COMPARISON:  None.  FINDINGS: Lung bases are clear.  There is decreased attenuation throughout the liver consistent with hepatic steatosis. There are multiple calcified granulomas throughout the liver. Beyond these calcified granulomas, no focal liver lesions are identified. There is no gallbladder wall thickening. There is no appreciable biliary duct dilatation.  Spleen contains multiple  granulomas. Spleen otherwise appears normal. Pancreas and adrenals appear within normal limits.  There is a cyst arising from the posterior mid left kidney measuring 3.7 x 3.1 cm. No other renal masses are appreciable. There is no renal hydronephrosis. There is no renal or ureteral calculus on either side.  In the pelvis, the urinary bladder is midline with normal wall thickness. There is no pelvic mass or pelvic collection. Appendix appears normal.  There is no bowel obstruction. No free air or portal venous air. There is no small or large bowel wall thickening. There is no mesenteric thickening. There is a minimal ventral hernia containing only fat.  There is no ascites, adenopathy, or abscess in the abdomen or pelvis. There is atherosclerotic change in the aorta but no apparent aneurysm. There is upper to mid lumbar dextroscoliosis. There is degenerative type change throughout the lumbar spine. There is a small lytic lesion in the there are several small lytic lesions in each inferior iliac crest. No fracture or impending fracture is seen.  IMPRESSION: No bowel obstruction. No abscess1. No larger small bowel wall thickening. A cause for rectal bleeding has not been established with this study. There is moderate stool throughout the colon.  There is hepatic steatosis. There is granulomatous calcification in the liver and spleen.  Appendix appears normal.  No bowel obstruction.  No abscess.  Minimal ventral hernia containing only fat.  Several small lytic lesions in the inferior iliac bones bilaterally consistent with known multiple myeloma.   Electronically Signed   By: WiLowella GripII M.D.   On: 09/17/2014 14:27     EKG Interpretation None      MDM   Final diagnoses:  Rectal bleeding    Rectal bleeding no obvious reason. Does have some external hemorrhoids. Will need colonoscopy for further evaluation. CT scan of the abdomen without any explanation for the bleeding. Patient's hemoglobin and  hematocrit is not significantly low.  His been no further bleeding today. Patient's only had streaks of blood and blood on the tall of paper. No tachycardia no hypotension. Patient given referral to GI for colonoscopy. Patient given precautions on when to return. Patient also has primary care doctor needs to [facility] they can follow-up with. Patient stable for discharge home.    Fredia Sorrow, MD 09/17/14 1447

## 2014-09-17 NOTE — ED Notes (Signed)
Pt having some rectal bleeding since yesterday.  Bright red on toilet paper and underwear.   None on stool.  Pt states he feels a bump on his rectum.

## 2014-09-27 ENCOUNTER — Encounter: Payer: Self-pay | Admitting: Gastroenterology

## 2014-10-02 ENCOUNTER — Ambulatory Visit (AMBULATORY_SURGERY_CENTER): Payer: Self-pay

## 2014-10-02 VITALS — Ht 71.0 in | Wt 209.8 lb

## 2014-10-02 DIAGNOSIS — Z1211 Encounter for screening for malignant neoplasm of colon: Secondary | ICD-10-CM

## 2014-10-02 NOTE — Progress Notes (Signed)
No allergies to eggs or soy No diet/weight loss meds No home oxygen No past problems with anesthesia  Has email  Emmi instructions given for colonoscopy 

## 2014-10-09 ENCOUNTER — Encounter: Payer: Self-pay | Admitting: Gastroenterology

## 2014-10-09 ENCOUNTER — Ambulatory Visit (AMBULATORY_SURGERY_CENTER): Payer: Medicare Other | Admitting: Gastroenterology

## 2014-10-09 VITALS — BP 153/83 | HR 51 | Temp 96.3°F | Resp 17 | Ht 71.0 in | Wt 209.0 lb

## 2014-10-09 DIAGNOSIS — K625 Hemorrhage of anus and rectum: Secondary | ICD-10-CM | POA: Diagnosis not present

## 2014-10-09 DIAGNOSIS — K648 Other hemorrhoids: Secondary | ICD-10-CM

## 2014-10-09 DIAGNOSIS — Z1211 Encounter for screening for malignant neoplasm of colon: Secondary | ICD-10-CM

## 2014-10-09 MED ORDER — SODIUM CHLORIDE 0.9 % IV SOLN: 500.0000 mL | INTRAVENOUS | Status: DC

## 2014-10-09 NOTE — Progress Notes (Signed)
  [facility] Post-op Note  Patient: Robert [name]  Procedure(s) Performed: colonoscopy  Patient Location: [facility] - Recovery Area  Anesthesia Type: Deep Sedation/Propofol  Level of Consciousness: awake, oriented and patient cooperative  Airway and Oxygen Therapy: Patient Spontanous Breathing  Post-op Pain: none  Post-op Assessment:  Post-op Vital signs reviewed, Patient's Cardiovascular Status Stable, Respiratory Function Stable, Patent Airway, No signs of Nausea or vomiting and Pain level controlled  Post-op Vital Signs: Reviewed and stable  Complications: No apparent anesthesia complications  Tyrian Peart E 11:11 AM

## 2014-10-09 NOTE — Patient Instructions (Signed)
Impression/recommendations:  Internal Hemorrhoids (handout given)  YOU HAD AN ENDOSCOPIC PROCEDURE TODAY AT [facility]:   Refer to the procedure report that was given to you for any specific questions about what was found during the examination.  If the procedure report does not answer your questions, please call your gastroenterologist to clarify.  If you requested that your care partner not be given the details of your procedure findings, then the procedure report has been included in a sealed envelope for you to review at your convenience later.  YOU SHOULD EXPECT: Some feelings of bloating in the abdomen. Passage of more gas than usual.  Walking can help get rid of the air that was put into your GI tract during the procedure and reduce the bloating. If you had a lower endoscopy (such as a colonoscopy or flexible sigmoidoscopy) you may notice spotting of blood in your stool or on the toilet paper. If you underwent a bowel prep for your procedure, you may not have a normal bowel movement for a few days.  Please Note:  You might notice some irritation and congestion in your nose or some drainage.  This is from the oxygen used during your procedure.  There is no need for concern and it should clear up in a day or so.  SYMPTOMS TO REPORT IMMEDIATELY:   Following lower endoscopy (colonoscopy or flexible sigmoidoscopy):  Excessive amounts of blood in the stool  Significant tenderness or worsening of abdominal pains  Swelling of the abdomen that is new, acute  Fever of 100F or higher   For urgent or emergent issues, a gastroenterologist can be reached at any hour by calling [phone].   DIET: Your first meal following the procedure should be a small meal and then it is ok to progress to your normal diet. Heavy or fried foods are harder to digest and may make you feel nauseous or bloated.  Likewise, meals heavy in dairy and vegetables can increase bloating.  Drink plenty of  fluids but you should avoid alcoholic beverages for 24 hours.  ACTIVITY:  You should plan to take it easy for the rest of today and you should NOT DRIVE or use heavy machinery until tomorrow (because of the sedation medicines used during the test).    FOLLOW UP: Our staff will call the number listed on your records the next business day following your procedure to check on you and address any questions or concerns that you may have regarding the information given to you following your procedure. If we do not reach you, we will leave a message.  However, if you are feeling well and you are not experiencing any problems, there is no need to return our call.  We will assume that you have returned to your regular daily activities without incident.  If any biopsies were taken you will be contacted by phone or by letter within the next 1-3 weeks.  Please call us at [phone] if you have not heard about the biopsies in 3 weeks.    SIGNATURES/CONFIDENTIALITY: You and/or your care partner have signed paperwork which will be entered into your electronic medical record.  These signatures attest to the fact that that the information above on your After Visit Summary has been reviewed and is understood.  Full responsibility of the confidentiality of this discharge information lies with you and/or your care-partner.

## 2014-10-09 NOTE — Op Note (Signed)
[facility]  [address] [place], [id number]   COLONOSCOPY PROCEDURE REPORT  PATIENT: Robert, [name]  MR#: [id number] BIRTHDATE: 1933/05/29 , 80  yrs. old GENDER: male ENDOSCOPIST: Inda Castle, MD REFERRED BY: PROCEDURE DATE:  10/09/2014 PROCEDURE:   Colonoscopy, diagnostic First Screening Colonoscopy - Avg.  risk and is 50 yrs.  old or older Yes.  Prior Negative Screening - Now for repeat screening. N/A  History of Adenoma - Now for follow-up colonoscopy & has been > or = to 3 yrs.  N/A  Recommend repeat exam, <10 yrs? No ASA CLASS:   Class II INDICATIONS:Evaluation of unexplained GI bleeding and anal bleeding.  MEDICATIONS: Monitored anesthesia care and Propofol 150 mg IV  DESCRIPTION OF PROCEDURE:   After the risks benefits and alternatives of the procedure were thoroughly explained, informed consent was obtained.  The digital rectal exam revealed no abnormalities of the rectum.   The LB [id number]  endoscope was introduced through the anus and advanced to the cecum, which was identified by both the appendix and ileocecal valve. No adverse events experienced.   The quality of the prep was (Suprep was used) excellent.  The instrument was then slowly withdrawn as the colon was fully examined.      COLON FINDINGS: Internal hemorrhoids were found.   The examination was otherwise normal.  Retroflexed views revealed no abnormalities. The time to cecum = 3.8 Withdrawal time = 7.1   The scope was withdrawn and the procedure completed. COMPLICATIONS: There were no immediate complications.  ENDOSCOPIC IMPRESSION: 1.   Internal hemorrhoids 2.   The examination was otherwise normal  Limited rectal bleeding secondary to hemorrhoids  RECOMMENDATIONS: Given your age, you will not need another colonoscopy for colon cancer screening or polyp surveillance.  These types of tests usually stop around the age 23.  eSigned:  Inda Castle, MD 10/09/2014  11:10 AM   cc: Annia Belt, MD

## 2014-10-10 ENCOUNTER — Telehealth: Payer: Self-pay | Admitting: *Deleted

## 2014-10-10 NOTE — Telephone Encounter (Signed)
  Follow up Call-  Call back number 10/09/2014  Post procedure Call Back phone  # [phone]  Permission to leave phone message Yes     Patient questions:  Do you have a fever, pain , or abdominal swelling? No. Pain Score  0 *  Have you tolerated food without any problems? Yes.    Have you been able to return to your normal activities? Yes.    Do you have any questions about your discharge instructions: Diet   No. Medications  No. Follow up visit  No.  Do you have questions or concerns about your Care? No.  Actions: * If pain score is 4 or above: No action needed, pain <4.

## 2015-01-03 ENCOUNTER — Encounter: Payer: Self-pay | Admitting: Hematology & Oncology

## 2015-01-03 ENCOUNTER — Other Ambulatory Visit (HOSPITAL_BASED_OUTPATIENT_CLINIC_OR_DEPARTMENT_OTHER): Payer: Medicare Other

## 2015-01-03 ENCOUNTER — Ambulatory Visit (HOSPITAL_BASED_OUTPATIENT_CLINIC_OR_DEPARTMENT_OTHER): Payer: Medicare Other | Admitting: Hematology & Oncology

## 2015-01-03 ENCOUNTER — Ambulatory Visit (HOSPITAL_BASED_OUTPATIENT_CLINIC_OR_DEPARTMENT_OTHER): Payer: Medicare Other

## 2015-01-03 VITALS — BP 192/96 | HR 54 | Temp 97.4°F | Resp 20 | Ht 71.0 in | Wt 208.0 lb

## 2015-01-03 DIAGNOSIS — C9000 Multiple myeloma not having achieved remission: Secondary | ICD-10-CM

## 2015-01-03 LAB — CBC WITH DIFFERENTIAL (CANCER CENTER ONLY)
BASO#: 0 10*3/uL (ref 0.0–0.2)
BASO%: 0.2 % (ref 0.0–2.0)
EOS%: 1.6 % (ref 0.0–7.0)
Eosinophils Absolute: 0.1 10*3/uL (ref 0.0–0.5)
HCT: 35.5 % — ABNORMAL LOW (ref 38.7–49.9)
HGB: 11.9 g/dL — ABNORMAL LOW (ref 13.0–17.1)
LYMPH#: 2.4 10*3/uL (ref 0.9–3.3)
LYMPH%: 42.4 % (ref 14.0–48.0)
MCH: 32.5 pg (ref 28.0–33.4)
MCHC: 33.5 g/dL (ref 32.0–35.9)
MCV: 97 fL (ref 82–98)
MONO#: 0.5 10*3/uL (ref 0.1–0.9)
MONO%: 8.4 % (ref 0.0–13.0)
NEUT#: 2.7 10*3/uL (ref 1.5–6.5)
NEUT%: 47.4 % (ref 40.0–80.0)
Platelets: 167 10*3/uL (ref 145–400)
RBC: 3.66 10*6/uL — ABNORMAL LOW (ref 4.20–5.70)
RDW: 13.2 % (ref 11.1–15.7)
WBC: 5.7 10*3/uL (ref 4.0–10.0)

## 2015-01-03 MED ORDER — ZOLEDRONIC ACID 4 MG/100ML IV SOLN
4.0000 mg | Freq: Once | INTRAVENOUS | Status: AC
  Administered 2015-01-03: 4 mg via INTRAVENOUS
  Filled 2015-01-03: qty 100

## 2015-01-03 NOTE — Patient Instructions (Signed)

## 2015-01-03 NOTE — Progress Notes (Signed)
Hematology and Oncology Follow Up Visit  Robert [name] [id number] [birth year] 81 y.o. 01/03/2015   Principle Diagnosis:   IgG Kappa Myeloma-remission  Current Therapy:    Zometa 4 mg IV q. 4 months     Interim History:  Mr.  Robert [name] is back for followup. He is doing well. He's had no problems since last saw him. He's losing some weight. He is exercising a little more. He's had no problems with respect to recurrent shingles.  Since we last saw him, he's had a pretty good summer. He and his wife might be going to the mountains next month.  When we last checked his myeloma studies, there was no obvious monoclonal spike.  He is trying to lose weight. He is watching what he eats. He's had no fever. He's had no bleeding. He's had no change in bowel or bladder habits.  There's not been any rashes. He is had no leg swelling.  He's had no change in medications.   Overall, his performance status is ECOG 1.  Medications:  Current outpatient prescriptions:  .  aspirin EC 81 MG tablet, Take 81 mg by mouth daily., Disp: , Rfl:  .  atenolol (TENORMIN) 50 MG tablet, Take 50 mg by mouth daily., Disp: , Rfl:  .  Calcium Carbonate-Vit D-Min (CALTRATE 600+D PLUS PO), Take 1 tablet by mouth daily., Disp: , Rfl:  .  Cholecalciferol (VITAMIN D3) 1000 UNITS CAPS, Take 1 capsule by mouth daily., Disp: , Rfl:  .  cyanocobalamin 2000 MCG tablet, Take 2,000 mcg by mouth daily., Disp: , Rfl:  .  famciclovir (FAMVIR) 250 MG tablet, TAKE 1 TABLET DAILY, Disp: 90 tablet, Rfl: 3 .  fluticasone (FLONASE) 50 MCG/ACT nasal spray, 2 sprays. Place 2 sprays into both nostrils daily., Disp: , Rfl:  .  Fluticasone-Salmeterol (ADVAIR DISKUS) 250-50 MCG/DOSE AEPB, Inhale into the lungs., Disp: , Rfl:  .  hydrochlorothiazide (HYDRODIURIL) 25 MG tablet, Take 25 mg by mouth daily., Disp: , Rfl:  .  losartan (COZAAR) 50 MG tablet, Take 50 mg by mouth daily., Disp: , Rfl:  .  Multiple Vitamin (MULITIVITAMIN WITH MINERALS) TABS,  Take 1 tablet by mouth daily., Disp: , Rfl:  .  potassium chloride (KLOR-CON) 20 MEQ packet, Take 20 mEq by mouth daily., Disp: , Rfl:  .  pravastatin (PRAVACHOL) 20 MG tablet, Take 20 mg by mouth at bedtime., Disp: , Rfl:  .  saline (AYR) GEL, Apply topically., Disp: , Rfl:  .  tadalafil (CIALIS) 20 MG tablet, TAKE 1 TABLET ONCE DAILY AS NEEDED FOR ERECTILE DYSFUNCTION. DO NOT TAKE MORE THAN ONE DOSE IN 24 HOURS. DO NOT TAKE WITH NITRATES, Disp: , Rfl:  .  Zoledronic Acid (ZOMETA) 4 MG/100ML IVPB, Inject 4 mg into the vein. Every 4 months., Disp: , Rfl:   Allergies: No Known Allergies  Past Medical History, Surgical history, Social history, and Family History were reviewed and updated.  Review of Systems: As above  Physical Exam:  height is 5\' 11"  (1.803 m) and weight is 208 lb (94.348 kg). His oral temperature is 97.4 F (36.3 C). His blood pressure is 192/96 and his pulse is 54. His respiration is 20.   Well-developed and well-nourished African American gentleman. I repeated his blood pressure and it was 178/82 There are no ocular or oral lesions. He has no palpable cervical or supraclavicular lymph nodes. Lungs are clear. Cardiac exam regular rate and rhythm with no murmurs rubs or bruits. Abdomen is soft. He has  good bowel sounds. There is no fluid wave. There is no palpable liver or spleen tip. Back exam no tenderness of the spine ribs or hips. Extremities shows no clubbing cyanosis or edema. He has good range which it was twisted has good muscle strength. Skin exam shows no rashes, ecchymosis or petechia. Neurological exam shows no focal deficits. Lab Results  Component Value Date   WBC 5.7 01/03/2015   HGB 11.9* 01/03/2015   HCT 35.5* 01/03/2015   MCV 97 01/03/2015   PLT 167 01/03/2015     Chemistry      Component Value Date/Time   NA 140 09/17/2014 1200   NA 141 08/30/2014 1110   K 4.0 09/17/2014 1200   K 4.0 08/30/2014 1110   CL 104 09/17/2014 1200   CL 101 08/30/2014  1110   CO2 30 09/17/2014 1200   CO2 27 08/30/2014 1110   BUN 19 09/17/2014 1200   BUN 13 08/30/2014 1110   CREATININE 1.18 09/17/2014 1200   CREATININE 1.2 08/30/2014 1110      Component Value Date/Time   CALCIUM 9.9 09/17/2014 1200   CALCIUM 9.4 08/30/2014 1110   ALKPHOS 58 09/17/2014 1200   ALKPHOS 47 08/30/2014 1110   AST 23 09/17/2014 1200   AST 25 08/30/2014 1110   ALT 16 09/17/2014 1200   ALT 20 08/30/2014 1110   BILITOT 1.0 09/17/2014 1200   BILITOT 1.20 08/30/2014 1110         Impression and Plan: [name] is 81 year old gentleman with myeloma. He is IgG Kappa myeloma. He underwent stem cell transplant back in 2006. He has been in remission since.  I do think that the Zometa is helping him. He is tolerating this well. He has no problems with his kidneys.  I will continue to encourage him to lose weight and to watch what he eats area did I keep time him to exercise more.  I think we can get him back in 6 months.  Volanda Napoleon, MD 8/11/201611:49 AM

## 2015-01-07 LAB — COMPREHENSIVE METABOLIC PANEL
ALT: 13 U/L (ref 9–46)
AST: 20 U/L (ref 10–35)
Albumin: 3.9 g/dL (ref 3.6–5.1)
Alkaline Phosphatase: 53 U/L (ref 40–115)
BUN: 14 mg/dL (ref 7–25)
CO2: 24 mmol/L (ref 20–31)
Calcium: 9.3 mg/dL (ref 8.6–10.3)
Chloride: 101 mmol/L (ref 98–110)
Creatinine, Ser: 1.09 mg/dL (ref 0.70–1.18)
Glucose, Bld: 81 mg/dL (ref 65–99)
Potassium: 3.8 mmol/L (ref 3.5–5.3)
Sodium: 136 mmol/L (ref 135–146)
Total Bilirubin: 0.9 mg/dL (ref 0.2–1.2)
Total Protein: 6.8 g/dL (ref 6.1–8.1)

## 2015-01-07 LAB — PROTEIN ELECTROPHORESIS, SERUM
Albumin ELP: 3.9 g/dL (ref 3.8–4.8)
Alpha-1-Globulin: 0.2 g/dL (ref 0.2–0.3)
Alpha-2-Globulin: 0.7 g/dL (ref 0.5–0.9)
Beta 2: 0.3 g/dL (ref 0.2–0.5)
Beta Globulin: 0.4 g/dL (ref 0.4–0.6)
Gamma Globulin: 1.3 g/dL (ref 0.8–1.7)
Total Protein, Serum Electrophoresis: 6.8 g/dL (ref 6.1–8.1)

## 2015-01-07 LAB — KAPPA/LAMBDA LIGHT CHAINS
Kappa free light chain: 3.13 mg/dL — ABNORMAL HIGH (ref 0.33–1.94)
Kappa:Lambda Ratio: 1.82 — ABNORMAL HIGH (ref 0.26–1.65)
Lambda Free Lght Chn: 1.72 mg/dL (ref 0.57–2.63)

## 2015-01-07 LAB — IGG, IGA, IGM
IgA: 157 mg/dL (ref 68–379)
IgG (Immunoglobin G), Serum: 1360 mg/dL (ref 650–1600)
IgM, Serum: 212 mg/dL (ref 41–251)

## 2015-02-07 ENCOUNTER — Encounter: Payer: Self-pay | Admitting: Podiatry

## 2015-02-07 ENCOUNTER — Ambulatory Visit (INDEPENDENT_AMBULATORY_CARE_PROVIDER_SITE_OTHER): Payer: Medicare Other | Admitting: Podiatry

## 2015-02-07 VITALS — BP 172/89 | HR 55 | Resp 16

## 2015-02-07 DIAGNOSIS — B351 Tinea unguium: Secondary | ICD-10-CM | POA: Diagnosis not present

## 2015-02-07 DIAGNOSIS — L84 Corns and callosities: Secondary | ICD-10-CM

## 2015-02-07 NOTE — Progress Notes (Signed)
Subjective:     Patient ID: Robert [name], male   DOB: 07-26-1933, 81 y.o.   MRN: [id number]  HPI patient presents with painful lesion plantar aspect left foot and pain with ambulation. Also complains of a thick brittle nailbeds hallux left that he's concerned about   Review of Systems     Objective:   Physical Exam Neurovascular status intact muscle strength adequate range of motion within normal limits with thick yellow brittle nailbeds hallux left that's localized in nature and a keratotic lesion sub-fifth metatarsal left that's painful when pressed    Assessment:     Mycotic nail infection left big toe with probable trauma and corn callus formation left    Plan:     Reviewed both conditions and for the nail I have recommended over-the-counter topical treatments and nail debridement. I debrided lesion left no iatrogenic bleeding noted and reappoint to recheck

## 2015-03-25 ENCOUNTER — Encounter (HOSPITAL_BASED_OUTPATIENT_CLINIC_OR_DEPARTMENT_OTHER): Payer: Self-pay

## 2015-03-25 ENCOUNTER — Emergency Department (HOSPITAL_BASED_OUTPATIENT_CLINIC_OR_DEPARTMENT_OTHER)
Admission: EM | Admit: 2015-03-25 | Discharge: 2015-03-25 | Disposition: A | Discharge status: Home / Self Care | Payer: Medicare Other | Attending: Emergency Medicine | Admitting: Emergency Medicine

## 2015-03-25 DIAGNOSIS — Z8579 Personal history of other malignant neoplasms of lymphoid, hematopoietic and related tissues: Secondary | ICD-10-CM | POA: Diagnosis not present

## 2015-03-25 DIAGNOSIS — Z7951 Long term (current) use of inhaled steroids: Secondary | ICD-10-CM | POA: Insufficient documentation

## 2015-03-25 DIAGNOSIS — Z87891 Personal history of nicotine dependence: Secondary | ICD-10-CM | POA: Diagnosis not present

## 2015-03-25 DIAGNOSIS — M1990 Unspecified osteoarthritis, unspecified site: Secondary | ICD-10-CM | POA: Diagnosis not present

## 2015-03-25 DIAGNOSIS — I10 Essential (primary) hypertension: Secondary | ICD-10-CM | POA: Insufficient documentation

## 2015-03-25 DIAGNOSIS — Z7982 Long term (current) use of aspirin: Secondary | ICD-10-CM | POA: Insufficient documentation

## 2015-03-25 DIAGNOSIS — E785 Hyperlipidemia, unspecified: Secondary | ICD-10-CM | POA: Insufficient documentation

## 2015-03-25 DIAGNOSIS — Z79899 Other long term (current) drug therapy: Secondary | ICD-10-CM | POA: Insufficient documentation

## 2015-03-25 LAB — CBC WITH DIFFERENTIAL/PLATELET
Basophils Absolute: 0 10*3/uL (ref 0.0–0.1)
Basophils Relative: 0 %
Eosinophils Absolute: 0.1 10*3/uL (ref 0.0–0.7)
Eosinophils Relative: 1 %
HCT: 37.5 % — ABNORMAL LOW (ref 39.0–52.0)
Hemoglobin: 12.5 g/dL — ABNORMAL LOW (ref 13.0–17.0)
Lymphocytes Relative: 30 %
Lymphs Abs: 1.5 10*3/uL (ref 0.7–4.0)
MCH: 31.9 pg (ref 26.0–34.0)
MCHC: 33.3 g/dL (ref 30.0–36.0)
MCV: 95.7 fL (ref 78.0–100.0)
Monocytes Absolute: 0.4 10*3/uL (ref 0.1–1.0)
Monocytes Relative: 9 %
Neutro Abs: 2.9 10*3/uL (ref 1.7–7.7)
Neutrophils Relative %: 60 %
Platelets: 189 10*3/uL (ref 150–400)
RBC: 3.92 MIL/uL — ABNORMAL LOW (ref 4.22–5.81)
RDW: 12.8 % (ref 11.5–15.5)
WBC: 4.9 10*3/uL (ref 4.0–10.5)

## 2015-03-25 LAB — URINALYSIS, ROUTINE W REFLEX MICROSCOPIC
Bilirubin Urine: NEGATIVE
Glucose, UA: NEGATIVE mg/dL
Hgb urine dipstick: NEGATIVE
Ketones, ur: NEGATIVE mg/dL
Leukocytes, UA: NEGATIVE
Nitrite: NEGATIVE
Protein, ur: NEGATIVE mg/dL
Specific Gravity, Urine: 1.007 (ref 1.005–1.030)
Urobilinogen, UA: 1 mg/dL (ref 0.0–1.0)
pH: 7.5 (ref 5.0–8.0)

## 2015-03-25 LAB — BASIC METABOLIC PANEL
Anion gap: 6 (ref 5–15)
BUN: 17 mg/dL (ref 6–20)
CO2: 29 mmol/L (ref 22–32)
Calcium: 9.5 mg/dL (ref 8.9–10.3)
Chloride: 103 mmol/L (ref 101–111)
Creatinine, Ser: 1.01 mg/dL (ref 0.61–1.24)
GFR calc Af Amer: >60 mL/min (ref >60)
GFR calc non Af Amer: >60 mL/min (ref >60)
Glucose, Bld: 103 mg/dL — ABNORMAL HIGH (ref 65–99)
Potassium: 3.5 mmol/L (ref 3.5–5.1)
Sodium: 138 mmol/L (ref 135–145)

## 2015-03-25 LAB — TROPONIN I: Troponin I: <0.03 ng/mL (ref <0.031)

## 2015-03-25 MED ORDER — LOSARTAN POTASSIUM 50 MG PO TABS
50.0000 mg | ORAL_TABLET | Freq: Once | ORAL | Status: DC
  Filled 2015-03-25: qty 1

## 2015-03-25 MED ORDER — HYDRALAZINE HCL 20 MG/ML IJ SOLN
10.0000 mg | Freq: Once | INTRAMUSCULAR | Status: AC
  Administered 2015-03-25: 10 mg via INTRAVENOUS
  Filled 2015-03-25: qty 1

## 2015-03-25 MED ORDER — HYDRALAZINE HCL 20 MG/ML IJ SOLN
20.0000 mg | Freq: Once | INTRAMUSCULAR | Status: AC
  Administered 2015-03-25: 20 mg via INTRAVENOUS
  Filled 2015-03-25: qty 1

## 2015-03-25 MED ORDER — HYDRALAZINE HCL 50 MG PO TABS
50.0000 mg | ORAL_TABLET | Freq: Once | ORAL | Status: DC
  Filled 2015-03-25: qty 1

## 2015-03-25 NOTE — ED Provider Notes (Signed)
CSN: [id number]     Arrival date & time 03/25/15  1113 History   First MD Initiated Contact with Patient 03/25/15 1127     Chief Complaint  Patient presents with  . Hypertension     (Consider location/radiation/quality/duration/timing/severity/associated sxs/prior Treatment) HPI The patient reports his blood pressure has been elevated since yesterday. He has been measuring it and pressures have ranged from 170s over 90s up to 190s over 100. Patient reports she's been taking his medications as per usual. He takes atenolol 50 mg daily he takes Cozaar 50 mg daily and hydrochlorothiazide 25 mg. Patient denies she's been having any associated symptoms. There's been no headache, no chest pain, no blurred vision, no weakness numbness or tinging the. Past Medical History  Diagnosis Date  . Multiple myeloma ([place])     2006  . Hyperlipidemia   . Hypertension   . Arthritis    Past Surgical History  Procedure Laterality Date  . Limbal stem cell transplant    . Cataract extraction bilateral w/ anterior vitrectomy     Family History  Problem Relation Age of Onset  . Colon cancer Neg Hx   . Heart attack Father   . Heart disease Father   . Throat cancer Mother   . Diabetes Brother    Social History  Substance Use Topics  . Smoking status: Former Smoker -- 4.00 packs/day for 9 years    Types: Cigars, Cigarettes    Start date: 07/08/1979    Quit date: 07/07/1988  . Smokeless tobacco: Never Used     Comment: quit 24 years ago  . Alcohol Use: 3.6 oz/week    0 Standard drinks or equivalent, 6 Glasses of wine per week    Review of Systems 10 Systems reviewed and are negative for acute change except as noted in the HPI.    Allergies  Review of patient's allergies indicates no known allergies.  Home Medications   Prior to Admission medications   Medication Sig Start Date End Date Taking? Authorizing Provider  aspirin EC 81 MG tablet Take 81 mg by mouth daily.    Historical Provider,  MD  atenolol (TENORMIN) 50 MG tablet Take 50 mg by mouth daily.    Historical Provider, MD  Calcium Carbonate-Vit D-Min (CALTRATE 600+D PLUS PO) Take 1 tablet by mouth daily.    Historical Provider, MD  Cholecalciferol (VITAMIN D3) 1000 UNITS CAPS Take 1 capsule by mouth daily.    Historical Provider, MD  cyanocobalamin 2000 MCG tablet Take 2,000 mcg by mouth daily.    Historical Provider, MD  famciclovir (FAMVIR) 250 MG tablet TAKE 1 TABLET DAILY 07/30/14   Volanda Napoleon, MD  fluticasone [facility]) 50 MCG/ACT nasal spray 2 sprays. Place 2 sprays into both nostrils daily.    Historical Provider, MD  Fluticasone-Salmeterol (ADVAIR DISKUS) 250-50 MCG/DOSE AEPB Inhale into the lungs. 11/22/14   Historical Provider, MD  hydrochlorothiazide (HYDRODIURIL) 25 MG tablet Take 25 mg by mouth daily.    Historical Provider, MD  losartan (COZAAR) 50 MG tablet Take 50 mg by mouth daily.    Historical Provider, MD  Multiple Vitamin (MULITIVITAMIN WITH MINERALS) TABS Take 1 tablet by mouth daily.    Historical Provider, MD  potassium chloride (KLOR-CON) 20 MEQ packet Take 20 mEq by mouth daily.    Historical Provider, MD  pravastatin (PRAVACHOL) 20 MG tablet Take 20 mg by mouth at bedtime.    Historical Provider, MD  saline (AYR) GEL Apply topically. 07/17/13   Historical Provider,  MD  tadalafil (CIALIS) 20 MG tablet TAKE 1 TABLET ONCE DAILY AS NEEDED FOR ERECTILE DYSFUNCTION. DO NOT TAKE MORE THAN ONE DOSE IN 24 HOURS. DO NOT TAKE WITH NITRATES 08/14/13   Historical Provider, MD  Zoledronic Acid (ZOMETA) 4 MG/100ML IVPB Inject 4 mg into the vein. Every 4 months.    Historical Provider, MD   BP 185/85 mmHg  Pulse 64  Temp(Src) 97.8 F (36.6 C) (Oral)  Resp 18  Ht _0  (1.803 m)  Wt 210 lb (95.255 kg)  BMI 29.30 kg/m2  SpO2 99% Physical Exam  Constitutional: He is oriented to person, place, and time. He appears well-developed and well-nourished.  HENT:  Head: Normocephalic and atraumatic.  Eyes: EOM are  normal. Pupils are equal, round, and reactive to light.  Neck: Neck supple.  Cardiovascular: Normal rate, regular rhythm, normal heart sounds and intact distal pulses.   Pulmonary/Chest: Effort normal and breath sounds normal.  Abdominal: Soft. Bowel sounds are normal. He exhibits no distension. There is no tenderness.  Musculoskeletal: Normal range of motion. He exhibits no edema or tenderness.  Neurological: He is alert and oriented to person, place, and time. He has normal strength. Coordination normal. GCS eye subscore is 4. GCS verbal subscore is 5. GCS motor subscore is 6.  Skin: Skin is warm, dry and intact.  Psychiatric: He has a normal mood and affect.    ED Course  Procedures (including critical care time) Labs Review Labs Reviewed  BASIC METABOLIC PANEL - Abnormal; Notable for the following:    Glucose, Bld 103 (*)    All other components within normal limits  CBC WITH DIFFERENTIAL/PLATELET - Abnormal; Notable for the following:    RBC 3.92 (*)    Hemoglobin 12.5 (*)    HCT 37.5 (*)    All other components within normal limits  URINALYSIS, ROUTINE W REFLEX MICROSCOPIC (NOT AT [facility])  TROPONIN I    Imaging Review No results found. I have personally reviewed and evaluated these images and lab results as part of my medical decision-making.   EKG Interpretation None      MDM   Final diagnoses:  Essential hypertension   Patient has hypertension without signs of endorgan damage. Initial blood pressure was 202/92. The patient was treated with hydralazine and given an additional dose of losartan. Patient is instructed to increase his losartan to 50 mg twice daily. He will continue his atenolol as prescribed. And he will continue his atenolol and hydrochlorothiazide as prescribed. Patient is instructed on signs and symptoms for which to return.    Charlesetta Shanks, MD 03/25/15 [phone]

## 2015-03-25 NOTE — ED Notes (Signed)
C/o elevated BP x 2 days-denies HA and CP-NAD-steady gait to triage

## 2015-03-25 NOTE — ED Notes (Signed)
Dr Johnney Killian notified Losartan 50 mg not available. Pt has med with him. Directed to take his evening dose now per VORB from Dr Johnney Killian

## 2015-03-25 NOTE — Discharge Instructions (Signed)
DASH Eating Plan Take your losartan in the morning as usual, and add an evening dose (losartan 50 mg twice a day.) Monitor blood pressure 3 times a day. See your family Dr. for recheck within one to 2 days. Return to the emergency department if your developing headache, blurred vision, chest pain or other concerning symptoms. DASH stands for "Dietary Approaches to Stop Hypertension." The DASH eating plan is a healthy eating plan that has been shown to reduce high blood pressure (hypertension). Additional health benefits may include reducing the risk of type 2 diabetes mellitus, heart disease, and stroke. The DASH eating plan may also help with weight loss. WHAT DO I NEED TO KNOW ABOUT THE DASH EATING PLAN? For the DASH eating plan, you will follow these general guidelines:  Choose foods with a percent daily value for sodium of less than 5% (as listed on the food label).  Use salt-free seasonings or herbs instead of table salt or sea salt.  Check with your health care provider or pharmacist before using salt substitutes.  Eat lower-sodium products, often labeled as "lower sodium" or "no salt added."  Eat fresh foods.  Eat more vegetables, fruits, and low-fat dairy products.  Choose whole grains. Look for the word "whole" as the first word in the ingredient list.  Choose fish and skinless chicken or [place] more often than red meat. Limit fish, poultry, and meat to 6 oz (170 g) each day.  Limit sweets, desserts, sugars, and sugary drinks.  Choose heart-healthy fats.  Limit cheese to 1 oz (28 g) per day.  Eat more home-cooked food and less restaurant, buffet, and fast food.  Limit fried foods.  Cook foods using methods other than frying.  Limit canned vegetables. If you do use them, rinse them well to decrease the sodium.  When eating at a restaurant, ask that your food be prepared with less salt, or no salt if possible. WHAT FOODS CAN I EAT? Seek help from a dietitian for  individual calorie needs. Grains Whole grain or whole wheat bread. Brown rice. Whole grain or whole wheat pasta. Quinoa, bulgur, and whole grain cereals. Low-sodium cereals. Corn or whole wheat flour tortillas. Whole grain cornbread. Whole grain crackers. Low-sodium crackers. Vegetables Fresh or frozen vegetables (raw, steamed, roasted, or grilled). Low-sodium or reduced-sodium tomato and vegetable juices. Low-sodium or reduced-sodium tomato sauce and paste. Low-sodium or reduced-sodium canned vegetables.  Fruits All fresh, canned (in natural juice), or frozen fruits. Meat and Other Protein Products Ground beef (85% or leaner), grass-fed beef, or beef trimmed of fat. Skinless chicken or [place]. Ground chicken or [place]. Pork trimmed of fat. All fish and seafood. Eggs. Dried beans, peas, or lentils. Unsalted nuts and seeds. Unsalted canned beans. Dairy Low-fat dairy products, such as skim or 1% milk, 2% or reduced-fat cheeses, low-fat ricotta or cottage cheese, or plain low-fat yogurt. Low-sodium or reduced-sodium cheeses. Fats and Oils Tub margarines without trans fats. Light or reduced-fat mayonnaise and salad dressings (reduced sodium). Avocado. Safflower, olive, or canola oils. Natural peanut or almond butter. Other Unsalted popcorn and pretzels. The items listed above may not be a complete list of recommended foods or beverages. Contact your dietitian for more options. WHAT FOODS ARE NOT RECOMMENDED? Grains White bread. White pasta. White rice. Refined cornbread. Bagels and croissants. Crackers that contain trans fat. Vegetables Creamed or fried vegetables. Vegetables in a cheese sauce. Regular canned vegetables. Regular canned tomato sauce and paste. Regular tomato and vegetable juices. Fruits Dried fruits. Canned fruit in  light or heavy syrup. Fruit juice. Meat and Other Protein Products Fatty cuts of meat. Ribs, chicken wings, bacon, sausage, bologna, salami, chitterlings, fatback, hot  dogs, bratwurst, and packaged luncheon meats. Salted nuts and seeds. Canned beans with salt. Dairy Whole or 2% milk, cream, half-and-half, and cream cheese. Whole-fat or sweetened yogurt. Full-fat cheeses or blue cheese. Nondairy creamers and whipped toppings. Processed cheese, cheese spreads, or cheese curds. Condiments Onion and garlic salt, seasoned salt, table salt, and sea salt. Canned and packaged gravies. Worcestershire sauce. Tartar sauce. Barbecue sauce. Teriyaki sauce. Soy sauce, including reduced sodium. Steak sauce. Fish sauce. Oyster sauce. Cocktail sauce. Horseradish. Ketchup and mustard. Meat flavorings and tenderizers. Bouillon cubes. Hot sauce. Tabasco sauce. Marinades. Taco seasonings. Relishes. Fats and Oils Butter, stick margarine, lard, shortening, ghee, and bacon fat. Coconut, palm kernel, or palm oils. Regular salad dressings. Other Pickles and olives. Salted popcorn and pretzels. The items listed above may not be a complete list of foods and beverages to avoid. Contact your dietitian for more information. WHERE CAN I FIND MORE INFORMATION? National Heart, Lung, and Blood Institute: travelstabloid.com   This information is not intended to replace advice given to you by your health care provider. Make sure you discuss any questions you have with your health care provider.   Document Released: 04/30/2011 Document Revised: 06/01/2014 Document Reviewed: 03/15/2013 Elsevier Interactive Patient Education 2016 [employer].  Hypertension Hypertension, commonly called high blood pressure, is when the force of blood pumping through your arteries is too strong. Your arteries are the blood vessels that carry blood from your heart throughout your body. A blood pressure reading consists of a higher number over a lower number, such as 110/72. The higher number (systolic) is the pressure inside your arteries when your heart pumps. The lower number  (diastolic) is the pressure inside your arteries when your heart relaxes. Ideally you want your blood pressure below 120/80. Hypertension forces your heart to work harder to pump blood. Your arteries may become narrow or stiff. Having untreated or uncontrolled hypertension can cause heart attack, stroke, kidney disease, and other problems. RISK FACTORS Some risk factors for high blood pressure are controllable. Others are not.  Risk factors you cannot control include:   Race. You may be at higher risk if you are African American.  Age. Risk increases with age.  Gender. Men are at higher risk than women before age 22 years. After age 46, women are at higher risk than men. Risk factors you can control include:  Not getting enough exercise or physical activity.  Being overweight.  Getting too much fat, sugar, calories, or salt in your diet.  Drinking too much alcohol. SIGNS AND SYMPTOMS Hypertension does not usually cause signs or symptoms. Extremely high blood pressure (hypertensive crisis) may cause headache, anxiety, shortness of breath, and nosebleed. DIAGNOSIS To check if you have hypertension, your health care provider will measure your blood pressure while you are seated, with your arm held at the level of your heart. It should be measured at least twice using the same arm. Certain conditions can cause a difference in blood pressure between your right and left arms. A blood pressure reading that is higher than normal on one occasion does not mean that you need treatment. If it is not clear whether you have high blood pressure, you may be asked to return on a different day to have your blood pressure checked again. Or, you may be asked to monitor your blood pressure at home for  1 or more weeks. TREATMENT Treating high blood pressure includes making lifestyle changes and possibly taking medicine. Living a healthy lifestyle can help lower high blood pressure. You may need to change some of  your habits. Lifestyle changes may include:  Following the DASH diet. This diet is high in fruits, vegetables, and whole grains. It is low in salt, red meat, and added sugars.  Keep your sodium intake below 2,300 mg per day.  Getting at least 30-45 minutes of aerobic exercise at least 4 times per week.  Losing weight if necessary.  Not smoking.  Limiting alcoholic beverages.  Learning ways to reduce stress. Your health care provider may prescribe medicine if lifestyle changes are not enough to get your blood pressure under control, and if one of the following is true:  You are 39-90 years of age and your systolic blood pressure is above 140.  You are 28 years of age or older, and your systolic blood pressure is above 150.  Your diastolic blood pressure is above 90.  You have diabetes, and your systolic blood pressure is over 948 or your diastolic blood pressure is over 90.  You have kidney disease and your blood pressure is above 140/90.  You have heart disease and your blood pressure is above 140/90. Your personal target blood pressure may vary depending on your medical conditions, your age, and other factors. HOME CARE INSTRUCTIONS  Have your blood pressure rechecked as directed by your health care provider.   Take medicines only as directed by your health care provider. Follow the directions carefully. Blood pressure medicines must be taken as prescribed. The medicine does not work as well when you skip doses. Skipping doses also puts you at risk for problems.  Do not smoke.   Monitor your blood pressure at home as directed by your health care provider. SEEK MEDICAL CARE IF:   You think you are having a reaction to medicines taken.  You have recurrent headaches or feel dizzy.  You have swelling in your ankles.  You have trouble with your vision. SEEK IMMEDIATE MEDICAL CARE IF:  You develop a severe headache or confusion.  You have unusual weakness, numbness,  or feel faint.  You have severe chest or abdominal pain.  You vomit repeatedly.  You have trouble breathing. MAKE SURE YOU:   Understand these instructions.  Will watch your condition.  Will get help right away if you are not doing well or get worse.   This information is not intended to replace advice given to you by your health care provider. Make sure you discuss any questions you have with your health care provider.   Document Released: 05/11/2005 Document Revised: 09/25/2014 Document Reviewed: 03/03/2013 Elsevier Interactive Patient Education [employer].

## 2015-03-25 NOTE — ED Notes (Signed)
Pt states that he has noticed his BP creeping up over the last month and decided to come have  It checked out.  Denies HA, vision problems or dizziness.

## 2015-04-08 DIAGNOSIS — E785 Hyperlipidemia, unspecified: Secondary | ICD-10-CM | POA: Insufficient documentation

## 2015-04-08 HISTORY — DX: Hyperlipidemia, unspecified: E78.5

## 2015-05-13 ENCOUNTER — Emergency Department (HOSPITAL_COMMUNITY)
Admission: EM | Admit: 2015-05-13 | Discharge: 2015-05-13 | Disposition: A | Discharge status: Home / Self Care | Payer: Medicare Other | Attending: Emergency Medicine | Admitting: Emergency Medicine

## 2015-05-13 ENCOUNTER — Encounter (HOSPITAL_COMMUNITY): Payer: Self-pay | Admitting: Emergency Medicine

## 2015-05-13 DIAGNOSIS — I10 Essential (primary) hypertension: Secondary | ICD-10-CM | POA: Insufficient documentation

## 2015-05-13 DIAGNOSIS — Z87891 Personal history of nicotine dependence: Secondary | ICD-10-CM | POA: Diagnosis not present

## 2015-05-13 DIAGNOSIS — D649 Anemia, unspecified: Secondary | ICD-10-CM | POA: Insufficient documentation

## 2015-05-13 DIAGNOSIS — E785 Hyperlipidemia, unspecified: Secondary | ICD-10-CM | POA: Diagnosis not present

## 2015-05-13 DIAGNOSIS — Z7951 Long term (current) use of inhaled steroids: Secondary | ICD-10-CM | POA: Insufficient documentation

## 2015-05-13 DIAGNOSIS — M1990 Unspecified osteoarthritis, unspecified site: Secondary | ICD-10-CM | POA: Diagnosis not present

## 2015-05-13 DIAGNOSIS — Z7982 Long term (current) use of aspirin: Secondary | ICD-10-CM | POA: Diagnosis not present

## 2015-05-13 DIAGNOSIS — Z8579 Personal history of other malignant neoplasms of lymphoid, hematopoietic and related tissues: Secondary | ICD-10-CM | POA: Diagnosis not present

## 2015-05-13 DIAGNOSIS — Z79899 Other long term (current) drug therapy: Secondary | ICD-10-CM | POA: Insufficient documentation

## 2015-05-13 LAB — I-STAT TROPONIN, ED: Troponin i, poc: 0 ng/mL (ref 0.00–0.08)

## 2015-05-13 LAB — CBC WITH DIFFERENTIAL/PLATELET
Basophils Absolute: 0 10*3/uL (ref 0.0–0.1)
Basophils Relative: 0 %
Eosinophils Absolute: 0.1 10*3/uL (ref 0.0–0.7)
Eosinophils Relative: 1 %
HCT: 34.7 % — ABNORMAL LOW (ref 39.0–52.0)
Hemoglobin: 11.6 g/dL — ABNORMAL LOW (ref 13.0–17.0)
Lymphocytes Relative: 32 %
Lymphs Abs: 1.7 10*3/uL (ref 0.7–4.0)
MCH: 32.2 pg (ref 26.0–34.0)
MCHC: 33.4 g/dL (ref 30.0–36.0)
MCV: 96.4 fL (ref 78.0–100.0)
Monocytes Absolute: 0.4 10*3/uL (ref 0.1–1.0)
Monocytes Relative: 6 %
Neutro Abs: 3.3 10*3/uL (ref 1.7–7.7)
Neutrophils Relative %: 61 %
Platelets: 207 10*3/uL (ref 150–400)
RBC: 3.6 MIL/uL — ABNORMAL LOW (ref 4.22–5.81)
RDW: 14.2 % (ref 11.5–15.5)
WBC: 5.5 10*3/uL (ref 4.0–10.5)

## 2015-05-13 LAB — URINALYSIS, ROUTINE W REFLEX MICROSCOPIC
Bilirubin Urine: NEGATIVE
Glucose, UA: NEGATIVE mg/dL
Hgb urine dipstick: NEGATIVE
Ketones, ur: NEGATIVE mg/dL
Leukocytes, UA: NEGATIVE
Nitrite: NEGATIVE
Protein, ur: NEGATIVE mg/dL
Specific Gravity, Urine: 1.006 (ref 1.005–1.030)
pH: 7.5 (ref 5.0–8.0)

## 2015-05-13 LAB — BASIC METABOLIC PANEL
Anion gap: 8 (ref 5–15)
BUN: 11 mg/dL (ref 6–20)
CO2: 25 mmol/L (ref 22–32)
Calcium: 9.2 mg/dL (ref 8.9–10.3)
Chloride: 105 mmol/L (ref 101–111)
Creatinine, Ser: 0.94 mg/dL (ref 0.61–1.24)
GFR calc Af Amer: >60 mL/min (ref >60)
GFR calc non Af Amer: >60 mL/min (ref >60)
Glucose, Bld: 100 mg/dL — ABNORMAL HIGH (ref 65–99)
Potassium: 4.6 mmol/L (ref 3.5–5.1)
Sodium: 138 mmol/L (ref 135–145)

## 2015-05-13 MED ORDER — HYDRALAZINE HCL 20 MG/ML IJ SOLN
10.0000 mg | Freq: Once | INTRAMUSCULAR | Status: AC
  Administered 2015-05-13: 10 mg via INTRAVENOUS
  Filled 2015-05-13: qty 1

## 2015-05-13 MED ORDER — HYDROCHLOROTHIAZIDE 12.5 MG PO CAPS
25.0000 mg | ORAL_CAPSULE | Freq: Once | ORAL | Status: AC
  Administered 2015-05-13: 25 mg via ORAL
  Filled 2015-05-13: qty 2

## 2015-05-13 NOTE — ED Notes (Signed)
Bed: [id number] Expected date:  Expected time:  Means of arrival:  Comments: Hold for [employer]

## 2015-05-13 NOTE — Discharge Instructions (Signed)
Your blood pressure came down to a better reading after some medications. You should be taking your coreg, valsartan, and HCTZ as directed. Measure your blood pressure in the mornings and evenings after a 10 minute relaxation period, and keep track of it in a log to show your regular doctor. Eat a low salt diet. Follow up with your regular doctor in 1 week for ongoing management of your blood pressure. Return to the ER for changes or worsening symptoms.   Hypertension Hypertension is another name for high blood pressure. High blood pressure forces your heart to work harder to pump blood. A blood pressure reading has two numbers, which includes a higher number over a lower number (example: 110/72). HOME CARE   Have your blood pressure rechecked by your doctor.  Only take medicine as told by your doctor. Follow the directions carefully. The medicine does not work as well if you skip doses. Skipping doses also puts you at risk for problems.  Do not smoke.  Monitor your blood pressure at home as told by your doctor. GET HELP IF:  You think you are having a reaction to the medicine you are taking.  You have repeat headaches or feel dizzy.  You have puffiness (swelling) in your ankles.  You have trouble with your vision. GET HELP RIGHT AWAY IF:   You get a very bad headache and are confused.  You feel weak, numb, or faint.  You get chest or belly (abdominal) pain.  You throw up (vomit).  You cannot breathe very well. MAKE SURE YOU:   Understand these instructions.  Will watch your condition.  Will get help right away if you are not doing well or get worse.   This information is not intended to replace advice given to you by your health care provider. Make sure you discuss any questions you have with your health care provider.   Document Released: 10/28/2007 Document Revised: 05/16/2013 Document Reviewed: 03/03/2013 Elsevier Interactive Patient Education 2016 [place] DASH stands for "Dietary Approaches to Stop Hypertension." The DASH eating plan is a healthy eating plan that has been shown to reduce high blood pressure (hypertension). Additional health benefits may include reducing the risk of type 2 diabetes mellitus, heart disease, and stroke. The DASH eating plan may also help with weight loss. WHAT DO I NEED TO KNOW ABOUT THE DASH EATING PLAN? For the DASH eating plan, you will follow these general guidelines:  Choose foods with a percent daily value for sodium of less than 5% (as listed on the food label).  Use salt-free seasonings or herbs instead of table salt or sea salt.  Check with your health care provider or pharmacist before using salt substitutes.  Eat lower-sodium products, often labeled as "lower sodium" or "no salt added."  Eat fresh foods.  Eat more vegetables, fruits, and low-fat dairy products.  Choose whole grains. Look for the word "whole" as the first word in the ingredient list.  Choose fish and skinless chicken or [place] more often than red meat. Limit fish, poultry, and meat to 6 oz (170 g) each day.  Limit sweets, desserts, sugars, and sugary drinks.  Choose heart-healthy fats.  Limit cheese to 1 oz (28 g) per day.  Eat more home-cooked food and less restaurant, buffet, and fast food.  Limit fried foods.  Cook foods using methods other than frying.  Limit canned vegetables. If you do use them, rinse them well to decrease the sodium.  When eating at a restaurant, ask that your food be prepared with less salt, or no salt if possible. WHAT FOODS CAN I EAT? Seek help from a dietitian for individual calorie needs. Grains Whole grain or whole wheat bread. Brown rice. Whole grain or whole wheat pasta. Quinoa, bulgur, and whole grain cereals. Low-sodium cereals. Corn or whole wheat flour tortillas. Whole grain cornbread. Whole grain crackers. Low-sodium crackers. Vegetables Fresh or frozen  vegetables (raw, steamed, roasted, or grilled). Low-sodium or reduced-sodium tomato and vegetable juices. Low-sodium or reduced-sodium tomato sauce and paste. Low-sodium or reduced-sodium canned vegetables.  Fruits All fresh, canned (in natural juice), or frozen fruits. Meat and Other Protein Products Ground beef (85% or leaner), grass-fed beef, or beef trimmed of fat. Skinless chicken or [place]. Ground chicken or [place]. Pork trimmed of fat. All fish and seafood. Eggs. Dried beans, peas, or lentils. Unsalted nuts and seeds. Unsalted canned beans. Dairy Low-fat dairy products, such as skim or 1% milk, 2% or reduced-fat cheeses, low-fat ricotta or cottage cheese, or plain low-fat yogurt. Low-sodium or reduced-sodium cheeses. Fats and Oils Tub margarines without trans fats. Light or reduced-fat mayonnaise and salad dressings (reduced sodium). Avocado. Safflower, olive, or canola oils. Natural peanut or almond butter. Other Unsalted popcorn and pretzels. The items listed above may not be a complete list of recommended foods or beverages. Contact your dietitian for more options. WHAT FOODS ARE NOT RECOMMENDED? Grains White bread. White pasta. White rice. Refined cornbread. Bagels and croissants. Crackers that contain trans fat. Vegetables Creamed or fried vegetables. Vegetables in a cheese sauce. Regular canned vegetables. Regular canned tomato sauce and paste. Regular tomato and vegetable juices. Fruits Dried fruits. Canned fruit in light or heavy syrup. Fruit juice. Meat and Other Protein Products Fatty cuts of meat. Ribs, chicken wings, bacon, sausage, bologna, salami, chitterlings, fatback, hot dogs, bratwurst, and packaged luncheon meats. Salted nuts and seeds. Canned beans with salt. Dairy Whole or 2% milk, cream, half-and-half, and cream cheese. Whole-fat or sweetened yogurt. Full-fat cheeses or blue cheese. Nondairy creamers and whipped toppings. Processed cheese, cheese spreads, or cheese  curds. Condiments Onion and garlic salt, seasoned salt, table salt, and sea salt. Canned and packaged gravies. Worcestershire sauce. Tartar sauce. Barbecue sauce. Teriyaki sauce. Soy sauce, including reduced sodium. Steak sauce. Fish sauce. Oyster sauce. Cocktail sauce. Horseradish. Ketchup and mustard. Meat flavorings and tenderizers. Bouillon cubes. Hot sauce. Tabasco sauce. Marinades. Taco seasonings. Relishes. Fats and Oils Butter, stick margarine, lard, shortening, ghee, and bacon fat. Coconut, palm kernel, or palm oils. Regular salad dressings. Other Pickles and olives. Salted popcorn and pretzels. The items listed above may not be a complete list of foods and beverages to avoid. Contact your dietitian for more information. WHERE CAN I FIND MORE INFORMATION? National Heart, Lung, and Blood Institute: travelstabloid.com   This information is not intended to replace advice given to you by your health care provider. Make sure you discuss any questions you have with your health care provider.   Document Released: 04/30/2011 Document Revised: 06/01/2014 Document Reviewed: 03/15/2013 Elsevier Interactive Patient Education [employer].

## 2015-05-13 NOTE — ED Notes (Signed)
Lab reports that light green tube had hemolyzed. Redrawn and sent.

## 2015-05-13 NOTE — ED Notes (Signed)
IV team at bedside 

## 2015-05-13 NOTE — ED Notes (Signed)
Attempted IV placement x 2.  Unsuccessful.  IV team consult.

## 2015-05-13 NOTE — ED Provider Notes (Signed)
CSN: [id number]     Arrival date & time 05/13/15  1445 History   First MD Initiated Contact with Patient 05/13/15 1615     Chief Complaint  Patient presents with  . Hypertension     (Consider location/radiation/quality/duration/timing/severity/associated sxs/prior Treatment) HPI Comments: Robert [name] is a 81 y.o. male with a PMHx of multiple myeloma, HLD, HTN, and arthritis, who presents to the ED with complaints of hypertension. Patient states that he went to [facility] to check his blood pressure because his home monitor was not checking it, and he continuously had an error message which indicated that it was reading as a high blood pressure. He states he is taking valsartan 320 mg daily which he last took this morning, and carvedilol 25 mg twice a day which he last took this morning and just prior to arrival. He reports that he has not been taking his HCTZ 25 mg in over one week. He states he stopped taking this because it made him urinate more frequently. He denies any symptoms at this time, including vision changes, lightheadedness, HA, fevers, chills, CP, SOB, abd pain, N/V/D/C, hematuria, dysuria, myalgias, arthralgias, numbness, tingling, or weakness. He has an appt with his PCP on Thursday. Chart review reveals that he had a visit on 03/25/15 for HTN, BP was similarly elevated, and his labs at that time did not show any end-organ damage.  Patient is a 81 y.o. male presenting with hypertension. The history is provided by the patient and medical records. No language interpreter was used.  Hypertension This is a recurrent problem. The current episode started today. The problem occurs constantly. The problem has been unchanged. Pertinent negatives include no abdominal pain, arthralgias, chest pain, chills, fever, headaches, myalgias, nausea, numbness, urinary symptoms, visual change, vomiting or weakness. Nothing aggravates the symptoms. He has tried nothing for the symptoms. The treatment  provided no relief.    Past Medical History  Diagnosis Date  . Multiple myeloma ([place])     2006  . Hyperlipidemia   . Hypertension   . Arthritis    Past Surgical History  Procedure Laterality Date  . Limbal stem cell transplant    . Cataract extraction bilateral w/ anterior vitrectomy     Family History  Problem Relation Age of Onset  . Colon cancer Neg Hx   . Heart attack Father   . Heart disease Father   . Throat cancer Mother   . Diabetes Brother    Social History  Substance Use Topics  . Smoking status: Former Smoker -- 4.00 packs/day for 9 years    Types: Cigars, Cigarettes    Start date: 07/08/1979    Quit date: 07/07/1988  . Smokeless tobacco: Never Used     Comment: quit 24 years ago  . Alcohol Use: 3.6 oz/week    0 Standard drinks or equivalent, 6 Glasses of wine per week    Review of Systems  Constitutional: Negative for fever and chills.       +HTN  Eyes: Negative for visual disturbance.  Respiratory: Negative for shortness of breath.   Cardiovascular: Negative for chest pain.  Gastrointestinal: Negative for nausea, vomiting, abdominal pain, diarrhea and constipation.  Genitourinary: Negative for dysuria and hematuria.  Musculoskeletal: Negative for myalgias and arthralgias.  Skin: Negative for color change.  Allergic/Immunologic: Negative for immunocompromised state.  Neurological: Negative for weakness, light-headedness, numbness and headaches.  Psychiatric/Behavioral: Negative for confusion.   10 Systems reviewed and are negative for acute change except as noted in  the HPI.    Allergies  Review of patient's allergies indicates no known allergies.  Home Medications   Prior to Admission medications   Medication Sig Start Date End Date Taking? Authorizing Provider  aspirin EC 81 MG tablet Take 81 mg by mouth daily.    Historical Provider, MD  atenolol (TENORMIN) 50 MG tablet Take 50 mg by mouth daily.    Historical Provider, MD  Calcium  Carbonate-Vit D-Min (CALTRATE 600+D PLUS PO) Take 1 tablet by mouth daily.    Historical Provider, MD  Cholecalciferol (VITAMIN D3) 1000 UNITS CAPS Take 1 capsule by mouth daily.    Historical Provider, MD  cyanocobalamin 2000 MCG tablet Take 2,000 mcg by mouth daily.    Historical Provider, MD  famciclovir (FAMVIR) 250 MG tablet TAKE 1 TABLET DAILY 07/30/14   Volanda Napoleon, MD  fluticasone [facility]) 50 MCG/ACT nasal spray 2 sprays. Place 2 sprays into both nostrils daily.    Historical Provider, MD  Fluticasone-Salmeterol (ADVAIR DISKUS) 250-50 MCG/DOSE AEPB Inhale into the lungs. 11/22/14   Historical Provider, MD  hydrochlorothiazide (HYDRODIURIL) 25 MG tablet Take 25 mg by mouth daily.    Historical Provider, MD  losartan (COZAAR) 50 MG tablet Take 50 mg by mouth daily.    Historical Provider, MD  Multiple Vitamin (MULITIVITAMIN WITH MINERALS) TABS Take 1 tablet by mouth daily.    Historical Provider, MD  potassium chloride (KLOR-CON) 20 MEQ packet Take 20 mEq by mouth daily.    Historical Provider, MD  pravastatin (PRAVACHOL) 20 MG tablet Take 20 mg by mouth at bedtime.    Historical Provider, MD  saline (AYR) GEL Apply topically. 07/17/13   Historical Provider, MD  tadalafil (CIALIS) 20 MG tablet TAKE 1 TABLET ONCE DAILY AS NEEDED FOR ERECTILE DYSFUNCTION. DO NOT TAKE MORE THAN ONE DOSE IN 24 HOURS. DO NOT TAKE WITH NITRATES 08/14/13   Historical Provider, MD  Zoledronic Acid (ZOMETA) 4 MG/100ML IVPB Inject 4 mg into the vein. Every 4 months.    Historical Provider, MD   BP 209/90 mmHg  Pulse 61  Temp(Src) 97.7 F (36.5 C) (Oral)  Resp 16  SpO2 100% Physical Exam  Constitutional: He is oriented to person, place, and time. He appears well-developed and well-nourished.  Non-toxic appearance. No distress.  Afebrile, nontoxic, NAD. HTN noted, BP initially 209/90  HENT:  Head: Normocephalic and atraumatic.  Mouth/Throat: Oropharynx is clear and moist and mucous membranes are normal.  Eyes:  Conjunctivae and EOM are normal. Right eye exhibits no discharge. Left eye exhibits no discharge.  Neck: Normal range of motion. Neck supple.  Cardiovascular: Normal rate, regular rhythm, normal heart sounds and intact distal pulses.  Exam reveals no gallop and no friction rub.   No murmur heard. RRR, nl s1/s2, no m/r/g, distal pulses intact, trace to 1+ b/l pitting pedal edema up to mid-calf  Pulmonary/Chest: Effort normal and breath sounds normal. No respiratory distress. He has no decreased breath sounds. He has no wheezes. He has no rhonchi. He has no rales.  CTAB in all lung fields, no w/r/r, no hypoxia or increased WOB, speaking in full sentences, SpO2 100% on RA   Abdominal: Soft. Normal appearance and bowel sounds are normal. He exhibits no distension. There is no tenderness. There is no rigidity, no rebound, no guarding, no CVA tenderness, no tenderness at McBurney's point and negative Murphy's sign.  Musculoskeletal: Normal range of motion.  Neurological: He is alert and oriented to person, place, and time. He has normal  strength. No sensory deficit.  Skin: Skin is warm, dry and intact. No rash noted.  Psychiatric: He has a normal mood and affect.  Nursing note and vitals reviewed.   ED Course  Procedures (including critical care time) Labs Review Labs Reviewed  CBC WITH DIFFERENTIAL/PLATELET - Abnormal; Notable for the following:    RBC 3.60 (*)    Hemoglobin 11.6 (*)    HCT 34.7 (*)    All other components within normal limits  BASIC METABOLIC PANEL - Abnormal; Notable for the following:    Glucose, Bld 100 (*)    All other components within normal limits  URINALYSIS, ROUTINE W REFLEX MICROSCOPIC (NOT AT [facility])  I-STAT TROPOININ, ED    Imaging Review No results found. I have personally reviewed and evaluated these images and lab results as part of my medical decision-making.   EKG Interpretation   Date/Time:  Monday May 13 2015 15:23:08 EST Ventricular Rate:   57 PR Interval:  221 QRS Duration: 90 QT Interval:  413 QTC Calculation: 402 R Axis:   22 Text Interpretation:  Sinus rhythm Prolonged PR interval No significant  change since last tracing Confirmed by NGUYEN, EMILY ([id number]) on 05/13/2015  7:53:26 PM      MDM   Final diagnoses:  Essential hypertension  Chronic anemia    81 y.o. male here with asymptomatic HTN. BP 209/90 upon arrival. Pt hasn't been taking HCTZ in >1wk. Minimal b/l pitting edema noted, but no there concerning exam findings. Will recheck BP now since pt just took his BP meds PTA. Will discuss case with Dr. Alfonse Spruce prior to proceeding with labs/meds.  5:20 PM BP 199/99 on recheck. Discussed case with attending Dr. Alfonse Spruce who would like to proceed with labs and hydralazine. Will reassess shortly.   8:28 PM BMP WNL, trop neg, EKG unchanged from prior, CBC with baseline anemia. U/A clear. No evidence of end-organ damage. BP down to 170s/90s after 52m hydralazine. Down to 168/80 upon recheck. Doubt need for further meds at this time. Discussed low salt diet and starting on his HCTZ again. F/up with PCP in 1wk. I explained the diagnosis and have given explicit precautions to return to the ER including for any other new or worsening symptoms. The patient understands and accepts the medical plan as it's been dictated and I have answered their questions. Discharge instructions concerning home care and prescriptions have been given. The patient is STABLE and is discharged to home in good condition.  BP 176/86 mmHg  Pulse 60  Temp(Src) 97.7 F (36.5 C) (Oral)  Resp 16  SpO2 98%  Meds ordered this encounter  Medications  . hydrALAZINE (APRESOLINE) injection 10 mg    Sig:   . hydrochlorothiazide (MICROZIDE) capsule 25 mg    Sig:       Jailynn Lavalais Camprubi-Soms, PA-C 05/13/15 2040  EHarvel Quale MD 05/16/15 [phone]

## 2015-05-13 NOTE — ED Notes (Signed)
Per pt, states BP has been reading high at home-asymptomatic

## 2015-07-04 ENCOUNTER — Encounter: Payer: Self-pay | Admitting: Hematology & Oncology

## 2015-07-04 ENCOUNTER — Other Ambulatory Visit (HOSPITAL_BASED_OUTPATIENT_CLINIC_OR_DEPARTMENT_OTHER): Payer: Medicare Other

## 2015-07-04 ENCOUNTER — Ambulatory Visit (HOSPITAL_BASED_OUTPATIENT_CLINIC_OR_DEPARTMENT_OTHER): Payer: Medicare Other

## 2015-07-04 ENCOUNTER — Ambulatory Visit (HOSPITAL_BASED_OUTPATIENT_CLINIC_OR_DEPARTMENT_OTHER): Payer: Medicare Other | Admitting: Hematology & Oncology

## 2015-07-04 VITALS — BP 204/89 | HR 57 | Temp 97.4°F | Resp 18 | Ht 71.0 in | Wt 211.0 lb

## 2015-07-04 DIAGNOSIS — C9000 Multiple myeloma not having achieved remission: Secondary | ICD-10-CM

## 2015-07-04 DIAGNOSIS — C9001 Multiple myeloma in remission: Secondary | ICD-10-CM | POA: Diagnosis present

## 2015-07-04 DIAGNOSIS — D649 Anemia, unspecified: Secondary | ICD-10-CM | POA: Diagnosis not present

## 2015-07-04 LAB — CBC WITH DIFFERENTIAL (CANCER CENTER ONLY)
BASO#: 0 10*3/uL (ref 0.0–0.2)
BASO%: 0.5 % (ref 0.0–2.0)
EOS%: 3.6 % (ref 0.0–7.0)
Eosinophils Absolute: 0.2 10*3/uL (ref 0.0–0.5)
HCT: 33.5 % — ABNORMAL LOW (ref 38.7–49.9)
HGB: 11.1 g/dL — ABNORMAL LOW (ref 13.0–17.1)
LYMPH#: 1.7 10*3/uL (ref 0.9–3.3)
LYMPH%: 40.3 % (ref 14.0–48.0)
MCH: 31.8 pg (ref 28.0–33.4)
MCHC: 33.1 g/dL (ref 32.0–35.9)
MCV: 96 fL (ref 82–98)
MONO#: 0.4 10*3/uL (ref 0.1–0.9)
MONO%: 9.4 % (ref 0.0–13.0)
NEUT#: 1.9 10*3/uL (ref 1.5–6.5)
NEUT%: 46.2 % (ref 40.0–80.0)
Platelets: 161 10*3/uL (ref 145–400)
RBC: 3.49 10*6/uL — ABNORMAL LOW (ref 4.20–5.70)
RDW: 13.2 % (ref 11.1–15.7)
WBC: 4.2 10*3/uL (ref 4.0–10.0)

## 2015-07-04 LAB — CMP (CANCER CENTER ONLY)
ALT(SGPT): 19 U/L (ref 10–47)
AST: 30 U/L (ref 11–38)
Albumin: 3.6 g/dL (ref 3.3–5.5)
Alkaline Phosphatase: 63 U/L (ref 26–84)
BUN, Bld: 17 mg/dL (ref 7–22)
CO2: 29 mEq/L (ref 18–33)
Calcium: 8.8 mg/dL (ref 8.0–10.3)
Chloride: 100 mEq/L (ref 98–108)
Creat: 1.1 mg/dl (ref 0.6–1.2)
Glucose, Bld: 100 mg/dL (ref 73–118)
Potassium: 3.5 mEq/L (ref 3.3–4.7)
Sodium: 135 mEq/L (ref 128–145)
Total Bilirubin: 0.8 mg/dl (ref 0.20–1.60)
Total Protein: 7.4 g/dL (ref 6.4–8.1)

## 2015-07-04 MED ORDER — SODIUM CHLORIDE 0.9 % IV SOLN
INTRAVENOUS | Status: DC
  Administered 2015-07-04: 11:00:00 via INTRAVENOUS

## 2015-07-04 MED ORDER — ZOLEDRONIC ACID 4 MG/100ML IV SOLN
4.0000 mg | Freq: Once | INTRAVENOUS | Status: AC
  Administered 2015-07-04: 4 mg via INTRAVENOUS
  Filled 2015-07-04: qty 100

## 2015-07-04 NOTE — Progress Notes (Signed)
Hematology and Oncology Follow Up Visit  Robert [name] [id number] 10-08-1933 82 y.o. 07/04/2015   Principle Diagnosis:   IgG Kappa Myeloma-remission  Current Therapy:    Zometa 4 mg IV q. 4 months     Interim History:  [name] is back for followup. He is doing well. He's had no problems since last saw him. He's gained some weight. He says he ate quite a bit over the holidays.  He's had some changes blood pressure medications. He now is on Coreg.  His last myeloma studies we saw him for muscular looked good. There is no obvious M spike. His IgG level was 1360 mg/dL. His Kappa Lightchain with 3.13 mg/dL.  He's had no headache. He's had no rashes. He's had no leg swelling.  He is going to the bathroom okay.   Overall, his performance status is ECOG 1.  Medications:  Current outpatient prescriptions:  .  acetaminophen (TYLENOL) 500 MG tablet, Take 1,000 mg by mouth every 6 (six) hours as needed for headache., Disp: , Rfl:  .  aspirin EC 81 MG tablet, Take 81 mg by mouth daily., Disp: , Rfl:  .  Calcium Carbonate-Vit D-Min (CALTRATE 600+D PLUS PO), Take 1 tablet by mouth daily., Disp: , Rfl:  .  carvedilol (COREG) 25 MG tablet, Take 25 mg by mouth 2 (two) times daily with a meal., Disp: , Rfl:  .  Cholecalciferol (VITAMIN D3) 1000 UNITS CAPS, Take 1 capsule by mouth daily., Disp: , Rfl:  .  cyanocobalamin 2000 MCG tablet, Take 2,000 mcg by mouth daily., Disp: , Rfl:  .  famciclovir (FAMVIR) 250 MG tablet, TAKE 1 TABLET DAILY, Disp: 90 tablet, Rfl: 3 .  fluticasone (FLONASE) 50 MCG/ACT nasal spray, Place 2 sprays into both nostrils 2 (two) times daily as needed for allergies. Place 2 sprays into both nostrils daily., Disp: , Rfl:  .  hydrochlorothiazide (HYDRODIURIL) 25 MG tablet, Take 25 mg by mouth daily., Disp: , Rfl:  .  Multiple Vitamin (MULITIVITAMIN WITH MINERALS) TABS, Take 1 tablet by mouth daily., Disp: , Rfl:  .  Polyethyl Glycol-Propyl Glycol (SYSTANE OP), Apply 1 drop to  eye daily as needed (dry eyes)., Disp: , Rfl:  .  potassium chloride (KLOR-CON) 20 MEQ packet, Take 20 mEq by mouth daily., Disp: , Rfl:  .  pravastatin (PRAVACHOL) 20 MG tablet, Take 20 mg by mouth at bedtime., Disp: , Rfl:  .  tadalafil (CIALIS) 20 MG tablet, TAKE 1 TABLET ONCE DAILY AS NEEDED FOR ERECTILE DYSFUNCTION. DO NOT TAKE MORE THAN ONE DOSE IN 24 HOURS. DO NOT TAKE WITH NITRATES, Disp: , Rfl:  .  valsartan (DIOVAN) 320 MG tablet, Take 320 mg by mouth daily., Disp: , Rfl:  .  Zoledronic Acid (ZOMETA) 4 MG/100ML IVPB, Inject 4 mg into the vein. Every 4 months., Disp: , Rfl:   Allergies: No Known Allergies  Past Medical History, Surgical history, Social history, and Family History were reviewed and updated.  Review of Systems: As above  Physical Exam:  height is 5\' 11"  (1.803 m) and weight is 211 lb (95.709 kg). His oral temperature is 97.4 F (36.3 C). His blood pressure is 204/89 and his pulse is 57. His respiration is 18.   Well-developed and well-nourished African American gentleman. I repeated his blood pressure and it was 180/100 There are no ocular or oral lesions. He has no palpable cervical or supraclavicular lymph nodes. Lungs are clear. Cardiac exam regular rate and rhythm with no  murmurs rubs or bruits. Abdomen is soft. He has good bowel sounds. There is no fluid wave. There is no palpable liver or spleen tip. Back exam no tenderness of the spine ribs or hips. Extremities shows no clubbing cyanosis or edema. He has good range which it was twisted has good muscle strength. Skin exam shows no rashes, ecchymosis or petechia. Neurological exam shows no focal deficits. Lab Results  Component Value Date   WBC 4.2 07/04/2015   HGB 11.1* 07/04/2015   HCT 33.5* 07/04/2015   MCV 96 07/04/2015   PLT 161 07/04/2015     Chemistry      Component Value Date/Time   NA 138 05/13/2015 1937   NA 141 08/30/2014 1110   K 4.6 05/13/2015 1937   K 4.0 08/30/2014 1110   CL 105 05/13/2015  1937   CL 101 08/30/2014 1110   CO2 25 05/13/2015 1937   CO2 27 08/30/2014 1110   BUN 11 05/13/2015 1937   BUN 13 08/30/2014 1110   CREATININE 0.94 05/13/2015 1937   CREATININE 1.2 08/30/2014 1110      Component Value Date/Time   CALCIUM 9.2 05/13/2015 1937   CALCIUM 9.4 08/30/2014 1110   ALKPHOS 53 01/03/2015 1106   ALKPHOS 47 08/30/2014 1110   AST 20 01/03/2015 1106   AST 25 08/30/2014 1110   ALT 13 01/03/2015 1106   ALT 20 08/30/2014 1110   BILITOT 0.9 01/03/2015 1106   BILITOT 1.20 08/30/2014 1110         Impression and Plan: Robert [name] is 82 year old gentleman with myeloma. He is IgG Kappa myeloma. He underwent stem cell transplant back in 2006. He has been in remission since.  I do think that the Zometa is helping him. He is tolerating this well. He has no problems with his kidneys.  I will continue to encourage him to lose weight and to watch what he eats area did I keep time him to exercise more.  I worry about his blood pressure. I did repeat his blood pressure with a manual cuff. It was 180/100. I told him that if this was not under better control, that he would easily end up on dialysis. He will see his family doctor about this.  As far as the anemia is concerned, this might be a reflection of his transplant and possibly myelodysplasia. It also could reflect the high blood pressure.  I will like to see him back in 3 months. I think that this will give [place] a good idea as to whether or not his anemia is going to improve. If not, then he will need a bone marrow test.   Volanda Napoleon, MD 2/9/201710:06 AM

## 2015-07-04 NOTE — Patient Instructions (Signed)

## 2015-07-05 LAB — KAPPA/LAMBDA LIGHT CHAINS
Ig Kappa Free Light Chain: 41.24 mg/L — ABNORMAL HIGH (ref 3.30–19.40)
Ig Lambda Free Light Chain: 18.75 mg/L (ref 5.71–26.30)
Kappa/Lambda FluidC Ratio: 2.2 — ABNORMAL HIGH (ref 0.26–1.65)

## 2015-07-05 LAB — IGG, IGA, IGM
IgA, Qn, Serum: 158 mg/dL (ref 61–437)
IgG, Qn, Serum: 1324 mg/dL (ref 700–1600)
IgM, Qn, Serum: 195 mg/dL — ABNORMAL HIGH (ref 15–143)

## 2015-07-09 LAB — PROTEIN ELECTROPHORESIS, SERUM, WITH REFLEX
A/G Ratio: 1.1 (ref 0.7–1.7)
Albumin: 3.6 g/dL (ref 2.9–4.4)
Alpha 1: 0.2 g/dL (ref 0.0–0.4)
Alpha 2: 0.7 g/dL (ref 0.4–1.0)
Beta: 1 g/dL (ref 0.7–1.3)
Gamma Globulin: 1.4 g/dL (ref 0.4–1.8)
Globulin, Total: 3.2 g/dL (ref 2.2–3.9)
Interpretation(See Below): 0
M-Spike, %: 0.5 g/dL — ABNORMAL HIGH
Total Protein: 6.8 g/dL (ref 6.0–8.5)

## 2015-09-08 ENCOUNTER — Other Ambulatory Visit: Payer: Self-pay | Admitting: Hematology & Oncology

## 2015-10-01 ENCOUNTER — Other Ambulatory Visit (HOSPITAL_BASED_OUTPATIENT_CLINIC_OR_DEPARTMENT_OTHER): Payer: Medicare Other

## 2015-10-01 ENCOUNTER — Ambulatory Visit (HOSPITAL_BASED_OUTPATIENT_CLINIC_OR_DEPARTMENT_OTHER): Payer: Medicare Other

## 2015-10-01 ENCOUNTER — Encounter: Payer: Self-pay | Admitting: Hematology & Oncology

## 2015-10-01 ENCOUNTER — Ambulatory Visit (HOSPITAL_BASED_OUTPATIENT_CLINIC_OR_DEPARTMENT_OTHER): Payer: Medicare Other | Admitting: Hematology & Oncology

## 2015-10-01 VITALS — BP 198/96 | HR 57 | Temp 97.4°F | Resp 18 | Ht 71.0 in | Wt 213.0 lb

## 2015-10-01 DIAGNOSIS — C9001 Multiple myeloma in remission: Secondary | ICD-10-CM

## 2015-10-01 DIAGNOSIS — C9002 Multiple myeloma in relapse: Secondary | ICD-10-CM

## 2015-10-01 LAB — CMP (CANCER CENTER ONLY)
ALT(SGPT): 19 U/L (ref 10–47)
AST: 24 U/L (ref 11–38)
Albumin: 3.4 g/dL (ref 3.3–5.5)
Alkaline Phosphatase: 54 U/L (ref 26–84)
BUN, Bld: 13 mg/dL (ref 7–22)
CO2: 29 mEq/L (ref 18–33)
Calcium: 9.8 mg/dL (ref 8.0–10.3)
Chloride: 95 mEq/L — ABNORMAL LOW (ref 98–108)
Creat: 1.2 mg/dl (ref 0.6–1.2)
Glucose, Bld: 100 mg/dL (ref 73–118)
Potassium: 3.8 mEq/L (ref 3.3–4.7)
Sodium: 136 mEq/L (ref 128–145)
Total Bilirubin: 1 mg/dl (ref 0.20–1.60)
Total Protein: 7 g/dL (ref 6.4–8.1)

## 2015-10-01 LAB — CBC WITH DIFFERENTIAL (CANCER CENTER ONLY)
BASO#: 0 10*3/uL (ref 0.0–0.2)
BASO%: 0.7 % (ref 0.0–2.0)
EOS%: 1.8 % (ref 0.0–7.0)
Eosinophils Absolute: 0.1 10*3/uL (ref 0.0–0.5)
HCT: 33.6 % — ABNORMAL LOW (ref 38.7–49.9)
HGB: 11.5 g/dL — ABNORMAL LOW (ref 13.0–17.1)
LYMPH#: 1.7 10*3/uL (ref 0.9–3.3)
LYMPH%: 38 % (ref 14.0–48.0)
MCH: 32.8 pg (ref 28.0–33.4)
MCHC: 34.2 g/dL (ref 32.0–35.9)
MCV: 96 fL (ref 82–98)
MONO#: 0.5 10*3/uL (ref 0.1–0.9)
MONO%: 11 % (ref 0.0–13.0)
NEUT#: 2.2 10*3/uL (ref 1.5–6.5)
NEUT%: 48.5 % (ref 40.0–80.0)
Platelets: 193 10*3/uL (ref 145–400)
RBC: 3.51 10*6/uL — ABNORMAL LOW (ref 4.20–5.70)
RDW: 13.4 % (ref 11.1–15.7)
WBC: 4.5 10*3/uL (ref 4.0–10.0)

## 2015-10-01 LAB — CHCC SATELLITE - SMEAR

## 2015-10-01 LAB — IRON AND TIBC
%SAT: 28 % (ref 20–55)
Iron: 87 ug/dL (ref 42–163)
TIBC: 306 ug/dL (ref 202–409)
UIBC: 219 ug/dL (ref 117–376)

## 2015-10-01 LAB — FERRITIN: Ferritin: 293 ng/ml (ref 22–316)

## 2015-10-01 LAB — LACTATE DEHYDROGENASE: LDH: 204 U/L (ref 125–245)

## 2015-10-01 MED ORDER — ZOLEDRONIC ACID 4 MG/100ML IV SOLN
4.0000 mg | Freq: Once | INTRAVENOUS | Status: AC
  Administered 2015-10-01: 4 mg via INTRAVENOUS
  Filled 2015-10-01: qty 100

## 2015-10-01 NOTE — Patient Instructions (Signed)

## 2015-10-01 NOTE — Progress Notes (Signed)
Hematology and Oncology Follow Up Visit  Robert [name] [id number] 09-18-1933 82 y.o. 10/01/2015   Principle Diagnosis:   IgG Kappa Myeloma-Relapsed  Current Therapy:    Zometa 4 mg IV q. 4 months     Interim History:  Mr.  Robert [name] is back for followup. Unfortunately, it looks like his myeloma has relapsed. We saw him back in February, his M spike was 0.5 g/L. His IgG level was 1324 mg/dL. His Kappa Lightchain was 4.1 mg/dL.  He was seen at [facility]. They also repeated his labs. Everything came back elevated.  His transplant doctor at [facility] feels that he needs to be retreated.  [name] looks great. As always, his weight is a problem. As always, his blood pressure is a problem. He sees his family doctor tomorrow.  He's had no bony pain. He's had no problems with bowels or bladder. He's had no cough. He's had no fever. He has had no swelling.  He's had no problems with his appetite. He is not exercising like he really needs to. I'm sure that at some point, his blood sugars will become a problem for him.  Overall, his performance status is ECOG 1.  Medications:  Current outpatient prescriptions:  .  acetaminophen (TYLENOL) 500 MG tablet, Take 1,000 mg by mouth every 6 (six) hours as needed for headache., Disp: , Rfl:  .  albuterol (PROAIR HFA) 108 (90 Base) MCG/ACT inhaler, Inhale into the lungs., Disp: , Rfl:  .  aspirin EC 81 MG tablet, Take 81 mg by mouth daily., Disp: , Rfl:  .  bimatoprost (LUMIGAN) 0.03 % ophthalmic solution, Place 1 drop into both eyes at bedtime., Disp: , Rfl:  .  Calcium Carbonate-Vit D-Min (CALTRATE 600+D PLUS PO), Take 1 tablet by mouth daily., Disp: , Rfl:  .  carvedilol (COREG) 25 MG tablet, Take 25 mg by mouth 2 (two) times daily with a meal., Disp: , Rfl:  .  Cholecalciferol (VITAMIN D3) 1000 UNITS CAPS, Take 1 capsule by mouth daily., Disp: , Rfl:  .  cyanocobalamin 2000 MCG tablet, Take 2,000 mcg by mouth daily., Disp: , Rfl:  .  famciclovir (FAMVIR) 250 MG  tablet, TAKE 1 TABLET DAILY, Disp: 90 tablet, Rfl: 2 .  fluticasone (FLONASE) 50 MCG/ACT nasal spray, Place 2 sprays into both nostrils 2 (two) times daily as needed for allergies. Place 2 sprays into both nostrils daily., Disp: , Rfl:  .  hydrochlorothiazide (HYDRODIURIL) 25 MG tablet, Take 25 mg by mouth daily., Disp: , Rfl:  .  Multiple Vitamin (MULITIVITAMIN WITH MINERALS) TABS, Take 1 tablet by mouth daily., Disp: , Rfl:  .  Polyethyl Glycol-Propyl Glycol (SYSTANE OP), Apply 1 drop to eye daily as needed (dry eyes)., Disp: , Rfl:  .  potassium chloride (KLOR-CON) 20 MEQ packet, Take 20 mEq by mouth daily., Disp: , Rfl:  .  pravastatin (PRAVACHOL) 20 MG tablet, Take 20 mg by mouth at bedtime., Disp: , Rfl:  .  tadalafil (CIALIS) 20 MG tablet, TAKE 1 TABLET ONCE DAILY AS NEEDED FOR ERECTILE DYSFUNCTION. DO NOT TAKE MORE THAN ONE DOSE IN 24 HOURS. DO NOT TAKE WITH NITRATES, Disp: , Rfl:  .  valsartan (DIOVAN) 320 MG tablet, Take 320 mg by mouth daily., Disp: , Rfl:  .  Zoledronic Acid (ZOMETA) 4 MG/100ML IVPB, Inject 4 mg into the vein. Every 4 months., Disp: , Rfl:  No current facility-administered medications for this visit.  Facility-Administered Medications Ordered in Other Visits:  .  Zoledronic  Acid (ZOMETA) 4 mg IVPB, 4 mg, Intravenous, Once, Volanda Napoleon, MD  Allergies: No Known Allergies  Past Medical History, Surgical history, Social history, and Family History were reviewed and updated.  Review of Systems: As above  Physical Exam:  height is 5\' 11"  (1.803 m) and weight is 213 lb (96.616 kg). His oral temperature is 97.4 F (36.3 C). His blood pressure is 198/96 and his pulse is 57. His respiration is 18.   Well-developed and well-nourished African American gentleman. I repeated his blood pressure and it was 180/100 There are no ocular or oral lesions. He has no palpable cervical or supraclavicular lymph nodes. Lungs are clear. Cardiac exam regular rate and rhythm with no  murmurs rubs or bruits. Abdomen is soft. He has good bowel sounds. There is no fluid wave. There is no palpable liver or spleen tip. Back exam no tenderness of the spine ribs or hips. Extremities shows no clubbing cyanosis or edema. He has good range which it was twisted has good muscle strength. Skin exam shows no rashes, ecchymosis or petechia. Neurological exam shows no focal deficits. Lab Results  Component Value Date   WBC 4.5 10/01/2015   HGB 11.5* 10/01/2015   HCT 33.6* 10/01/2015   MCV 96 10/01/2015   PLT 193 10/01/2015     Chemistry      Component Value Date/Time   NA 136 10/01/2015 0931   NA 138 05/13/2015 1937   K 3.8 10/01/2015 0931   K 4.6 05/13/2015 1937   CL 95* 10/01/2015 0931   CL 105 05/13/2015 1937   CO2 29 10/01/2015 0931   CO2 25 05/13/2015 1937   BUN 13 10/01/2015 0931   BUN 11 05/13/2015 1937   CREATININE 1.2 10/01/2015 0931   CREATININE 0.94 05/13/2015 1937      Component Value Date/Time   CALCIUM 9.8 10/01/2015 0931   CALCIUM 9.2 05/13/2015 1937   ALKPHOS 54 10/01/2015 0931   ALKPHOS 53 01/03/2015 1106   AST 24 10/01/2015 0931   AST 20 01/03/2015 1106   ALT 19 10/01/2015 0931   ALT 13 01/03/2015 1106   BILITOT 1.00 10/01/2015 0931   BILITOT 0.9 01/03/2015 1106         Impression and Plan: [name] is 82 year old gentleman with myeloma. He is IgG Kappa myeloma. He underwent stem cell transplant back in 2006.   I Think that we have to do a workup on him now. He will need a bone marrow test. I will go and get this set up for May 23.  We will also get a bone survey on him.  I will get a 24-hour urine on him for light chain excretion.   I talked to him about all this. I spent about 40 minutes with him. He understands that his myeloma has come back.  At 82 years old, I don't think that he would be a candidate for a another transplant. However, this might be considered as he is in pretty good shape.   I will like to see him back in one month.  The point time, we've I will initiate therapy with Velcade/Revlimid/Decadron   Volanda Napoleon, MD 5/9/201710:38 AM

## 2015-10-02 LAB — KAPPA/LAMBDA LIGHT CHAINS
Ig Kappa Free Light Chain: 50.04 mg/L — ABNORMAL HIGH (ref 3.30–19.40)
Ig Lambda Free Light Chain: 16.47 mg/L (ref 5.71–26.30)
Kappa/Lambda FluidC Ratio: 3.04 — ABNORMAL HIGH (ref 0.26–1.65)

## 2015-10-02 LAB — RETICULOCYTES: Reticulocyte Count: 1 % (ref 0.6–2.6)

## 2015-10-02 LAB — IGG, IGA, IGM
IgA, Qn, Serum: 120 mg/dL (ref 61–437)
IgG, Qn, Serum: 1257 mg/dL (ref 700–1600)
IgM, Qn, Serum: 169 mg/dL — ABNORMAL HIGH (ref 15–143)

## 2015-10-02 LAB — ERYTHROPOIETIN: Erythropoietin: 21.3 m[IU]/mL — ABNORMAL HIGH (ref 2.6–18.5)

## 2015-10-03 ENCOUNTER — Other Ambulatory Visit: Payer: Medicare Other

## 2015-10-04 LAB — UIFE/LIGHT CHAINS/TP QN, 24-HR UR
FR KAPPA LT CH,24HR: 17 mg/24 hr
FR LAMBDA LT CH,24HR: 1 mg/24 hr
Free Kappa Lt Chains,Ur: 5.56 mg/L (ref 1.35–24.19)
Free Lambda Lt Chains,Ur: 0.26 mg/L (ref 0.24–6.66)
Kappa/Lambda Ratio,U: 21.38 — ABNORMAL HIGH (ref 2.04–10.37)
PROTEIN,TOTAL,URINE: 5 mg/dL
Prot,24hr calculated: 150 mg/24 hr (ref 30–150)

## 2015-10-14 ENCOUNTER — Other Ambulatory Visit: Payer: Self-pay | Admitting: Hematology & Oncology

## 2015-10-14 DIAGNOSIS — C9002 Multiple myeloma in relapse: Secondary | ICD-10-CM

## 2015-10-15 ENCOUNTER — Ambulatory Visit (HOSPITAL_COMMUNITY)
Admission: RE | Admit: 2015-10-15 | Discharge: 2015-10-15 | Disposition: A | Discharge status: Home / Self Care | Payer: Medicare Other | Source: Ambulatory Visit | Attending: Hematology & Oncology | Admitting: Hematology & Oncology

## 2015-10-15 ENCOUNTER — Encounter (HOSPITAL_COMMUNITY): Payer: Self-pay

## 2015-10-15 VITALS — BP 143/85 | HR 57 | Temp 97.4°F | Resp 16 | Ht 71.0 in | Wt 212.0 lb

## 2015-10-15 DIAGNOSIS — C9000 Multiple myeloma not having achieved remission: Secondary | ICD-10-CM | POA: Diagnosis present

## 2015-10-15 DIAGNOSIS — D649 Anemia, unspecified: Secondary | ICD-10-CM | POA: Insufficient documentation

## 2015-10-15 DIAGNOSIS — C9002 Multiple myeloma in relapse: Secondary | ICD-10-CM | POA: Diagnosis not present

## 2015-10-15 DIAGNOSIS — D72822 Plasmacytosis: Secondary | ICD-10-CM | POA: Diagnosis not present

## 2015-10-15 LAB — CBC WITH DIFFERENTIAL/PLATELET
Basophils Absolute: 0 10*3/uL (ref 0.0–0.1)
Basophils Relative: 0 %
Eosinophils Absolute: 0.1 10*3/uL (ref 0.0–0.7)
Eosinophils Relative: 3 %
HCT: 33 % — ABNORMAL LOW (ref 39.0–52.0)
Hemoglobin: 11.1 g/dL — ABNORMAL LOW (ref 13.0–17.0)
Lymphocytes Relative: 41 %
Lymphs Abs: 1.9 10*3/uL (ref 0.7–4.0)
MCH: 32 pg (ref 26.0–34.0)
MCHC: 33.6 g/dL (ref 30.0–36.0)
MCV: 95.1 fL (ref 78.0–100.0)
Monocytes Absolute: 0.3 10*3/uL (ref 0.1–1.0)
Monocytes Relative: 7 %
Neutro Abs: 2.3 10*3/uL (ref 1.7–7.7)
Neutrophils Relative %: 49 %
Platelets: 207 10*3/uL (ref 150–400)
RBC: 3.47 MIL/uL — ABNORMAL LOW (ref 4.22–5.81)
RDW: 14 % (ref 11.5–15.5)
WBC: 4.7 10*3/uL (ref 4.0–10.5)

## 2015-10-15 LAB — BONE MARROW EXAM

## 2015-10-15 MED ORDER — SODIUM CHLORIDE 0.9 % IV SOLN
Freq: Once | INTRAVENOUS | Status: AC
  Administered 2015-10-15: 07:00:00 via INTRAVENOUS

## 2015-10-15 MED ORDER — MIDAZOLAM HCL 5 MG/ML IJ SOLN
10.0000 mg | Freq: Once | INTRAMUSCULAR | Status: DC
  Filled 2015-10-15: qty 2

## 2015-10-15 MED ORDER — MIDAZOLAM HCL 5 MG/5ML IJ SOLN
INTRAMUSCULAR | Status: AC | PRN
  Administered 2015-10-15: 1 mg via INTRAVENOUS
  Administered 2015-10-15: 2.5 mg via INTRAVENOUS

## 2015-10-15 MED ORDER — MEPERIDINE HCL 25 MG/ML IJ SOLN
INTRAMUSCULAR | Status: AC | PRN
  Administered 2015-10-15 (×2): 12.5 mg via INTRAVENOUS

## 2015-10-15 MED ORDER — MEPERIDINE HCL 50 MG/ML IJ SOLN
50.0000 mg | Freq: Once | INTRAMUSCULAR | Status: DC
  Filled 2015-10-15: qty 1

## 2015-10-15 NOTE — Sedation Documentation (Signed)
Patient is resting comfortably. 

## 2015-10-15 NOTE — Procedures (Signed)
This is a bone marrow biopsy and aspirate procedure note for Robert [name].  He is brought the short stay unit at [facility]. He had IV placed peripherally.  We did the appropriate timeout procedure. This is done at 7:45 AM.  His Mallimpati score was 1. His ASA class was 1.  We placed him onto his right side. We then gave him a total of 3.5 mg of Versed and 25 mg of Demerol for IV sedation.  The left posterior iliac crest region was prepped and draped in sterile fashion. 5 mL of lidocaine was indicated under the skin down to the periosteum.  Reason a scalpel to make an incision into the skin.  I then use the combination biopsy and aspirate needle. I obtained to bone marrow aspirates without difficulty.  I then obtained a good bone marrow biopsy.  The patient tolerated the procedure well. There were no complications. I cleaned and dressed the procedure site sterilely.  I then talked to his wife. I brought her back to the procedure room.  This is a procedure note for [employer]

## 2015-10-15 NOTE — Sedation Documentation (Signed)
Medication dose calculated and verified for: Demerol 25mg  IV and Versed 3.5 mg IV

## 2015-10-15 NOTE — Sedation Documentation (Signed)
Dressing CDI ambulated in room and to BR. Steady gait

## 2015-10-15 NOTE — Sedation Documentation (Signed)
Dressing remains C/D/I.

## 2015-10-15 NOTE — Sedation Documentation (Signed)
Family updated as to patient's status.

## 2015-10-15 NOTE — Sedation Documentation (Signed)
Patient denies pain and is resting comfortably.  

## 2015-10-15 NOTE — Sedation Documentation (Signed)
Procedure ends. Dressing post iliac area with hypafix and gauze. Pt placed supine

## 2015-10-15 NOTE — Discharge Instructions (Signed)
Do not drive  For 24 hours Do not go into public places today May resume your regular diet and take home medications as usual May experience small amount of tingling in leg (biopsy side) May take shower and remove bandage in am For any questions or concerns, call Clare Charon If bleeding occurs at site, hold pressure x10 minutes  If continues, call Dr Marin Olp Bone Marrow Aspiration and Bone Marrow Biopsy, Care After Refer to this sheet in the next few weeks. These instructions provide you with information about caring for yourself after your procedure. Your health care provider may also give you more specific instructions. Your treatment has been planned according to current medical practices, but problems sometimes occur. Call your health care provider if you have any problems or questions after your procedure. WHAT TO EXPECT AFTER THE PROCEDURE After your procedure, it is common to have:  Soreness or tenderness around the puncture site.  Bruising. HOME CARE INSTRUCTIONS  Take medicines only as directed by your health care provider.  Follow your health care provider's instructions about:  Puncture site care.  Bandage (dressing) changes and removal.  Bathe and shower as directed by your health care provider.  Check your puncture site every day for signs of infection. Watch for:  Redness, swelling, or pain.  Fluid, blood, or pus.  Return to your normal activities as directed by your health care provider.  Keep all follow-up visits as directed by your health care provider. This is important. SEEK MEDICAL CARE IF:  You have a fever.  You have uncontrollable bleeding.  You have redness, swelling, or pain at the site of your puncture.  You have fluid, blood, or pus coming from your puncture site.   This information is not intended to replace advice given to you by your health care provider. Make sure you discuss any questions you have with your health care provider.     Document Released: 11/28/2004 Document Revised: 09/25/2014 Document Reviewed: 05/02/2014 Elsevier Interactive Patient Education 2016 [employer] Moderate Conscious Sedation, Adult, Care After Refer to this sheet in the next few weeks. These instructions provide you with information on caring for yourself after your procedure. Your health care provider may also give you more specific instructions. Your treatment has been planned according to current medical practices, but problems sometimes occur. Call your health care provider if you have any problems or questions after your procedure. WHAT TO EXPECT AFTER THE PROCEDURE  After your procedure:  You may feel sleepy, clumsy, and have poor balance for several hours.  Vomiting may occur if you eat too soon after the procedure. HOME CARE INSTRUCTIONS  Do not participate in any activities where you could become injured for at least 24 hours. Do not:  Drive.  Swim.  Ride a bicycle.  Operate heavy machinery.  Cook.  Use power tools.  Climb ladders.  Work from a high place.  Do not make important decisions or sign legal documents until you are improved.  If you vomit, drink water, juice, or soup when you can drink without vomiting. Make sure you have little or no nausea before eating solid foods.  Only take over-the-counter or prescription medicines for pain, discomfort, or fever as directed by your health care provider.  Make sure you and your family fully understand everything about the medicines given to you, including what side effects may occur.  You should not drink alcohol, take sleeping pills, or take medicines that cause drowsiness for at least 24 hours.  If you smoke, do not smoke without supervision. °· If you are feeling better, you may resume normal activities 24 hours after you were sedated. °· Keep all appointments with your health care provider. °SEEK MEDICAL CARE IF: °· Your skin is pale or bluish in color. °· You  continue to feel nauseous or vomit. °· Your pain is getting worse and is not helped by medicine. °· You have bleeding or swelling. °· You are still sleepy or feeling clumsy after 24 hours. °SEEK IMMEDIATE MEDICAL CARE IF: °· You develop a rash. °· You have difficulty breathing. °· You develop any type of allergic problem. °· You have a fever. °MAKE SURE YOU: °· Understand these instructions. °· Will watch your condition. °· Will get help right away if you are not doing well or get worse. °  °This information is not intended to replace advice given to you by your health care provider. Make sure you discuss any questions you have with your health care provider. °  °Document Released: 03/01/2013 Document Revised: 06/01/2014 Document Reviewed: 03/01/2013 °Elsevier Interactive Patient Education ©2016 Elsevier Inc. °. ° °

## 2015-10-18 ENCOUNTER — Telehealth: Payer: Self-pay | Admitting: *Deleted

## 2015-10-18 NOTE — Telephone Encounter (Signed)
-----  Message from Volanda Napoleon, MD sent at 10/17/2015  5:54 PM EDT ----- I left a message on his answering machine that the bone marrow biopsy showed less than 10% myeloma cells in the bone marrow. Because of this, I don't think we have to start him on any kind of therapy and that all we had to do with just watch him closely and does monitor his blood studies. I told him this is good news and that we can just see him back a little more often but yet not put him on any time if chemotherapy protocol.  He can call back if he has any questions.  Robert [name]

## 2015-10-28 ENCOUNTER — Other Ambulatory Visit: Payer: Self-pay | Admitting: *Deleted

## 2015-10-28 DIAGNOSIS — C9002 Multiple myeloma in relapse: Secondary | ICD-10-CM

## 2015-10-29 ENCOUNTER — Encounter: Payer: Self-pay | Admitting: *Deleted

## 2015-10-29 ENCOUNTER — Other Ambulatory Visit (HOSPITAL_BASED_OUTPATIENT_CLINIC_OR_DEPARTMENT_OTHER): Payer: Medicare Other

## 2015-10-29 ENCOUNTER — Ambulatory Visit (HOSPITAL_BASED_OUTPATIENT_CLINIC_OR_DEPARTMENT_OTHER): Payer: Medicare Other | Admitting: Hematology & Oncology

## 2015-10-29 ENCOUNTER — Encounter: Payer: Self-pay | Admitting: Hematology & Oncology

## 2015-10-29 VITALS — BP 209/89 | HR 54 | Temp 97.5°F | Resp 16 | Ht 71.0 in | Wt 217.0 lb

## 2015-10-29 DIAGNOSIS — C9002 Multiple myeloma in relapse: Secondary | ICD-10-CM | POA: Diagnosis present

## 2015-10-29 DIAGNOSIS — C9000 Multiple myeloma not having achieved remission: Secondary | ICD-10-CM | POA: Diagnosis not present

## 2015-10-29 LAB — COMPREHENSIVE METABOLIC PANEL
ALT: 14 U/L (ref 0–55)
AST: 20 U/L (ref 5–34)
Albumin: 3.7 g/dL (ref 3.5–5.0)
Alkaline Phosphatase: 52 U/L (ref 40–150)
Anion Gap: 5 mEq/L (ref 3–11)
BUN: 13.8 mg/dL (ref 7.0–26.0)
CO2: 27 mEq/L (ref 22–29)
Calcium: 9.3 mg/dL (ref 8.4–10.4)
Chloride: 106 mEq/L (ref 98–109)
Creatinine: 1.1 mg/dL (ref 0.7–1.3)
EGFR: 76 mL/min/{1.73_m2} — ABNORMAL LOW (ref >90)
Glucose: 89 mg/dl (ref 70–140)
Potassium: 3.9 mEq/L (ref 3.5–5.1)
Sodium: 138 mEq/L (ref 136–145)
Total Bilirubin: 0.75 mg/dL (ref 0.20–1.20)
Total Protein: 7.3 g/dL (ref 6.4–8.3)

## 2015-10-29 LAB — CBC WITH DIFFERENTIAL (CANCER CENTER ONLY)
BASO#: 0 10*3/uL (ref 0.0–0.2)
BASO%: 0.2 % (ref 0.0–2.0)
EOS%: 2.2 % (ref 0.0–7.0)
Eosinophils Absolute: 0.1 10*3/uL (ref 0.0–0.5)
HCT: 33.8 % — ABNORMAL LOW (ref 38.7–49.9)
HGB: 11.2 g/dL — ABNORMAL LOW (ref 13.0–17.1)
LYMPH#: 1.8 10*3/uL (ref 0.9–3.3)
LYMPH%: 43.3 % (ref 14.0–48.0)
MCH: 32.7 pg (ref 28.0–33.4)
MCHC: 33.1 g/dL (ref 32.0–35.9)
MCV: 99 fL — ABNORMAL HIGH (ref 82–98)
MONO#: 0.3 10*3/uL (ref 0.1–0.9)
MONO%: 7.9 % (ref 0.0–13.0)
NEUT#: 1.9 10*3/uL (ref 1.5–6.5)
NEUT%: 46.4 % (ref 40.0–80.0)
Platelets: 153 10*3/uL (ref 145–400)
RBC: 3.42 10*6/uL — ABNORMAL LOW (ref 4.20–5.70)
RDW: 13.7 % (ref 11.1–15.7)
WBC: 4 10*3/uL (ref 4.0–10.0)

## 2015-10-29 LAB — LACTATE DEHYDROGENASE: LDH: 188 U/L (ref 125–245)

## 2015-10-29 LAB — FERRITIN: Ferritin: 265 ng/ml (ref 22–316)

## 2015-10-29 LAB — IRON AND TIBC
%SAT: 33 % (ref 20–55)
Iron: 93 ug/dL (ref 42–163)
TIBC: 280 ug/dL (ref 202–409)
UIBC: 187 ug/dL (ref 117–376)

## 2015-10-29 NOTE — Progress Notes (Signed)
Hematology and Oncology Follow Up Visit  Robert [name] [id number] 07/20/1933 82 y.o. 10/29/2015   Principle Diagnosis:   IgG Kappa Myeloma-Relapsed  Current Therapy:    Zometa 4 mg IV q. 4 months     Interim History:  Mr.  Robert [name] is back for followup. Unfortunately, it looks like his myeloma has relapsed. We saw him back in February, his M spike was 0.5 g/L. His IgG level was 1324 mg/dL. His Kappa Lightchain was 4.1 mg/dL.  He was seen at [facility]. They also repeated his labs. Everything came back elevated.  His transplant doctor at [facility] feels that he needs to be retreated.  We will ahead and did a bone marrow biopsy on him. This is performed on May 23. The bone marrow biopsy report ([id number]) showed only 9% plasma cells. We had a very good specimen. There were no large aggregates or sheets. Per do not have back the cytogenetics or FISH studies as of yet.  His last IgG level was 125 7 mg/dL. His 24-hour urine only showed 17 mg of Kappa Lightchain.  He is doing well. He is feeling well.  He still needs exercise and lose weight. I keep tell him that if he can exercise more, his protein levels will get better. Hopefully, this will be the incentive for him.  There is not been any fever. He's had no rashes. He's had no nausea or vomiting.  Overall, his performance status is ECOG 1.  Medications:  Current outpatient prescriptions:  .  acetaminophen (TYLENOL) 500 MG tablet, Take 1,000 mg by mouth every 6 (six) hours as needed for headache., Disp: , Rfl:  .  albuterol (PROAIR HFA) 108 (90 Base) MCG/ACT inhaler, Inhale into the lungs., Disp: , Rfl:  .  aspirin EC 81 MG tablet, Take 81 mg by mouth daily., Disp: , Rfl:  .  bimatoprost (LUMIGAN) 0.03 % ophthalmic solution, Place 1 drop into both eyes at bedtime., Disp: , Rfl:  .  Calcium Carbonate-Vit D-Min (CALTRATE 600+D PLUS PO), Take 1 tablet by mouth daily., Disp: , Rfl:  .  carvedilol (COREG) 25 MG tablet, Take 25 mg by mouth 2 (two) times  daily with a meal., Disp: , Rfl:  .  Cholecalciferol (VITAMIN D3) 1000 UNITS CAPS, Take 1 capsule by mouth daily., Disp: , Rfl:  .  cyanocobalamin 2000 MCG tablet, Take 2,000 mcg by mouth daily., Disp: , Rfl:  .  famciclovir (FAMVIR) 250 MG tablet, TAKE 1 TABLET DAILY, Disp: 90 tablet, Rfl: 2 .  fluticasone (FLONASE) 50 MCG/ACT nasal spray, Place 2 sprays into both nostrils 2 (two) times daily as needed for allergies. Place 2 sprays into both nostrils daily., Disp: , Rfl:  .  hydrochlorothiazide (HYDRODIURIL) 25 MG tablet, Take 25 mg by mouth daily., Disp: , Rfl:  .  Multiple Vitamin (MULITIVITAMIN WITH MINERALS) TABS, Take 1 tablet by mouth daily., Disp: , Rfl:  .  Polyethyl Glycol-Propyl Glycol (SYSTANE OP), Apply 1 drop to eye daily as needed (dry eyes)., Disp: , Rfl:  .  potassium chloride (KLOR-CON) 20 MEQ packet, Take 20 mEq by mouth daily., Disp: , Rfl:  .  pravastatin (PRAVACHOL) 20 MG tablet, Take 20 mg by mouth at bedtime., Disp: , Rfl:  .  tadalafil (CIALIS) 20 MG tablet, TAKE 1 TABLET ONCE DAILY AS NEEDED FOR ERECTILE DYSFUNCTION. DO NOT TAKE MORE THAN ONE DOSE IN 24 HOURS. DO NOT TAKE WITH NITRATES, Disp: , Rfl:  .  valsartan (DIOVAN) 320 MG tablet, Take 320  mg by mouth daily., Disp: , Rfl:  .  Zoledronic Acid (ZOMETA) 4 MG/100ML IVPB, Inject 4 mg into the vein. Every 4 months., Disp: , Rfl:   Allergies: No Known Allergies  Past Medical History, Surgical history, Social history, and Family History were reviewed and updated.  Review of Systems: As above  Physical Exam:  height is '5\' 11"'  (1.803 m) and weight is 217 lb (98.431 kg). His oral temperature is 97.5 F (36.4 C). His blood pressure is 209/89 and his pulse is 54. His respiration is 16.   Well-developed and well-nourished African American gentleman. I repeated his blood pressure and it was 180/100 There are no ocular or oral lesions. He has no palpable cervical or supraclavicular lymph nodes. Lungs are clear. Cardiac exam  regular rate and rhythm with no murmurs rubs or bruits. Abdomen is soft. He has good bowel sounds. There is no fluid wave. There is no palpable liver or spleen tip. Back exam no tenderness of the spine ribs or hips. Extremities shows no clubbing cyanosis or edema. He has good range which it was twisted has good muscle strength. Skin exam shows no rashes, ecchymosis or petechia. Neurological exam shows no focal deficits. Lab Results  Component Value Date   WBC 4.0 10/29/2015   HGB 11.2* 10/29/2015   HCT 33.8* 10/29/2015   MCV 99* 10/29/2015   PLT 153 10/29/2015     Chemistry      Component Value Date/Time   NA 136 10/01/2015 0931   NA 138 05/13/2015 1937   K 3.8 10/01/2015 0931   K 4.6 05/13/2015 1937   CL 95* 10/01/2015 0931   CL 105 05/13/2015 1937   CO2 29 10/01/2015 0931   CO2 25 05/13/2015 1937   BUN 13 10/01/2015 0931   BUN 11 05/13/2015 1937   CREATININE 1.2 10/01/2015 0931   CREATININE 0.94 05/13/2015 1937      Component Value Date/Time   CALCIUM 9.8 10/01/2015 0931   CALCIUM 9.2 05/13/2015 1937   ALKPHOS 54 10/01/2015 0931   ALKPHOS 53 01/03/2015 1106   AST 24 10/01/2015 0931   AST 20 01/03/2015 1106   ALT 19 10/01/2015 0931   ALT 13 01/03/2015 1106   BILITOT 1.00 10/01/2015 0931   BILITOT 0.9 01/03/2015 1106         Impression and Plan: [name] is 82 year old gentleman with myeloma. He is IgG Kappa myeloma. He underwent stem cell transplant back in 2006.   I Believe that we can watch him for right now. He actually looks quite good. His levels are very low. The bone marrow really does not show much in the way of bone marrow involvement. I will have to await the results of his cytogenetics and FISH studies.  I will plan to get him back in about 3 months. I did this would be very reasonable.  I spent about 15 Ms. with he and his wife. Again, try to impress upon him the importance of exercising and losing weight. I think this will help with his protein studies.    Volanda Napoleon, MD 6/6/201712:28 PM

## 2015-10-30 LAB — CHROMOSOME ANALYSIS, BONE MARROW

## 2015-10-30 LAB — KAPPA/LAMBDA LIGHT CHAINS
Ig Kappa Free Light Chain: 55.6 mg/L — ABNORMAL HIGH (ref 3.3–19.4)
Ig Lambda Free Light Chain: 16.5 mg/L (ref 5.7–26.3)
Kappa/Lambda FluidC Ratio: 3.37 — ABNORMAL HIGH (ref 0.26–1.65)

## 2015-10-30 LAB — TISSUE HYBRIDIZATION (BONE MARROW)-NCBH

## 2015-10-31 LAB — PROTEIN ELECTROPHORESIS, SERUM
A/G Ratio: 1.2 (ref 0.7–1.7)
Albumin: 3.7 g/dL (ref 2.9–4.4)
Alpha 1: 0.2 g/dL (ref 0.0–0.4)
Alpha 2: 0.6 g/dL (ref 0.4–1.0)
Beta: 0.9 g/dL (ref 0.7–1.3)
Gamma Globulin: 1.4 g/dL (ref 0.4–1.8)
Globulin, Total: 3 g/dL (ref 2.2–3.9)
M-Spike, %: 0.7 g/dL — ABNORMAL HIGH

## 2015-10-31 LAB — IMMUNOFIXATION ELECTROPHORESIS
IgA, Qn, Serum: 116 mg/dL (ref 61–437)
IgG, Qn, Serum: 1324 mg/dL (ref 700–1600)
IgM, Qn, Serum: 163 mg/dL — ABNORMAL HIGH (ref 15–143)
Total Protein: 6.7 g/dL (ref 6.0–8.5)

## 2015-11-28 DIAGNOSIS — H6120 Impacted cerumen, unspecified ear: Secondary | ICD-10-CM | POA: Insufficient documentation

## 2015-11-28 DIAGNOSIS — I10 Essential (primary) hypertension: Secondary | ICD-10-CM

## 2015-11-28 HISTORY — DX: Essential (primary) hypertension: I10

## 2015-11-28 HISTORY — DX: Impacted cerumen, unspecified ear: H61.20

## 2016-01-29 ENCOUNTER — Encounter: Payer: Self-pay | Admitting: Hematology & Oncology

## 2016-01-29 ENCOUNTER — Other Ambulatory Visit (HOSPITAL_BASED_OUTPATIENT_CLINIC_OR_DEPARTMENT_OTHER): Payer: Medicare Other

## 2016-01-29 ENCOUNTER — Ambulatory Visit (HOSPITAL_BASED_OUTPATIENT_CLINIC_OR_DEPARTMENT_OTHER): Payer: Medicare Other | Admitting: Hematology & Oncology

## 2016-01-29 ENCOUNTER — Ambulatory Visit (HOSPITAL_BASED_OUTPATIENT_CLINIC_OR_DEPARTMENT_OTHER)
Admission: RE | Admit: 2016-01-29 | Discharge: 2016-01-29 | Disposition: A | Discharge status: Home / Self Care | Payer: Medicare Other | Source: Ambulatory Visit | Attending: Hematology & Oncology | Admitting: Hematology & Oncology

## 2016-01-29 ENCOUNTER — Ambulatory Visit (HOSPITAL_BASED_OUTPATIENT_CLINIC_OR_DEPARTMENT_OTHER): Payer: Medicare Other

## 2016-01-29 VITALS — BP 171/90 | HR 57 | Temp 97.6°F | Resp 18 | Ht 71.0 in | Wt 214.0 lb

## 2016-01-29 DIAGNOSIS — Z9484 Stem cells transplant status: Secondary | ICD-10-CM | POA: Diagnosis not present

## 2016-01-29 DIAGNOSIS — C9002 Multiple myeloma in relapse: Secondary | ICD-10-CM

## 2016-01-29 DIAGNOSIS — C9000 Multiple myeloma not having achieved remission: Secondary | ICD-10-CM

## 2016-01-29 LAB — CMP (CANCER CENTER ONLY)
ALT(SGPT): 16 U/L (ref 10–47)
AST: 27 U/L (ref 11–38)
Albumin: 3.8 g/dL (ref 3.3–5.5)
Alkaline Phosphatase: 63 U/L (ref 26–84)
BUN, Bld: 11 mg/dL (ref 7–22)
CO2: 30 mEq/L (ref 18–33)
Calcium: 9.1 mg/dL (ref 8.0–10.3)
Chloride: 94 mEq/L — ABNORMAL LOW (ref 98–108)
Creat: 0.8 mg/dl (ref 0.6–1.2)
Glucose, Bld: 98 mg/dL (ref 73–118)
Potassium: 3.5 mEq/L (ref 3.3–4.7)
Sodium: 128 mEq/L (ref 128–145)
Total Bilirubin: 1 mg/dl (ref 0.20–1.60)
Total Protein: 7.6 g/dL (ref 6.4–8.1)

## 2016-01-29 LAB — CBC WITH DIFFERENTIAL (CANCER CENTER ONLY)
BASO#: 0 10*3/uL (ref 0.0–0.2)
BASO%: 0.4 % (ref 0.0–2.0)
EOS%: 2.8 % (ref 0.0–7.0)
Eosinophils Absolute: 0.1 10*3/uL (ref 0.0–0.5)
HCT: 33.1 % — ABNORMAL LOW (ref 38.7–49.9)
HGB: 11.6 g/dL — ABNORMAL LOW (ref 13.0–17.1)
LYMPH#: 1.4 10*3/uL (ref 0.9–3.3)
LYMPH%: 30.8 % (ref 14.0–48.0)
MCH: 33 pg (ref 28.0–33.4)
MCHC: 35 g/dL (ref 32.0–35.9)
MCV: 94 fL (ref 82–98)
MONO#: 0.4 10*3/uL (ref 0.1–0.9)
MONO%: 7.5 % (ref 0.0–13.0)
NEUT#: 2.7 10*3/uL (ref 1.5–6.5)
NEUT%: 58.5 % (ref 40.0–80.0)
Platelets: 182 10*3/uL (ref 145–400)
RBC: 3.52 10*6/uL — ABNORMAL LOW (ref 4.20–5.70)
RDW: 12.4 % (ref 11.1–15.7)
WBC: 4.7 10*3/uL (ref 4.0–10.0)

## 2016-01-29 MED ORDER — ZOLEDRONIC ACID 4 MG/100ML IV SOLN
4.0000 mg | Freq: Once | INTRAVENOUS | Status: AC
  Administered 2016-01-29: 4 mg via INTRAVENOUS
  Filled 2016-01-29: qty 100

## 2016-01-29 NOTE — Patient Instructions (Signed)

## 2016-01-29 NOTE — Progress Notes (Signed)
Hematology and Oncology Follow Up Visit  Robert [name] [id number] 1933/08/12 82 y.o. 01/29/2016   Principle Diagnosis:   IgG Kappa Myeloma-Relapsed  Current Therapy:    Zometa 4 mg IV q. 3 months     Interim History:  [name] is back for followup. Unfortunately, it looks like his myeloma has relapsed. We saw him back in June and his M spike was 0.7 g/dL. His IgG level was 1324 mg/dL. His kappa light chain was htchain was 5.6 mg/dL.  His cytogenetics and FISH showed normal chromosomes.  He is doing well. He is feeling well.  He still needs exercise and lose weight. I keep tell him that if he can exercise more, his protein levels will get better. Hopefully, this will be the incentive for him.  There is not been any fever. He's had no rashes. He's had no nausea or vomiting.  We did do a bone survey on him today. The bone survey did not show any evidence of myeloma in the bones. Overall, his performance status is ECOG 1.  Medications:  Current Outpatient Prescriptions:  .  acetaminophen (TYLENOL) 500 MG tablet, Take 1,000 mg by mouth every 6 (six) hours as needed for headache., Disp: , Rfl:  .  albuterol (PROAIR HFA) 108 (90 Base) MCG/ACT inhaler, Inhale into the lungs., Disp: , Rfl:  .  aspirin EC 81 MG tablet, Take 81 mg by mouth daily., Disp: , Rfl:  .  bimatoprost (LUMIGAN) 0.03 % ophthalmic solution, Place 1 drop into both eyes at bedtime., Disp: , Rfl:  .  Calcium Carbonate-Vit D-Min (CALTRATE 600+D PLUS PO), Take 1 tablet by mouth daily., Disp: , Rfl:  .  carvedilol (COREG) 25 MG tablet, Take 25 mg by mouth 2 (two) times daily with a meal., Disp: , Rfl:  .  Cholecalciferol (VITAMIN D3) 1000 UNITS CAPS, Take 1 capsule by mouth daily., Disp: , Rfl:  .  cloNIDine (CATAPRES) 0.1 MG tablet, Take by mouth., Disp: , Rfl:  .  cyanocobalamin 2000 MCG tablet, Take 2,000 mcg by mouth daily., Disp: , Rfl:  .  famciclovir (FAMVIR) 250 MG tablet, TAKE 1 TABLET DAILY, Disp: 90 tablet, Rfl:  2 .  fluticasone (FLONASE) 50 MCG/ACT nasal spray, Place 2 sprays into both nostrils 2 (two) times daily as needed for allergies. Place 2 sprays into both nostrils daily., Disp: , Rfl:  .  hydrochlorothiazide (HYDRODIURIL) 25 MG tablet, Take 25 mg by mouth daily., Disp: , Rfl:  .  Multiple Vitamin (MULITIVITAMIN WITH MINERALS) TABS, Take 1 tablet by mouth daily., Disp: , Rfl:  .  Polyethyl Glycol-Propyl Glycol (SYSTANE OP), Apply 1 drop to eye daily as needed (dry eyes)., Disp: , Rfl:  .  potassium chloride (KLOR-CON) 20 MEQ packet, Take 20 mEq by mouth daily., Disp: , Rfl:  .  pravastatin (PRAVACHOL) 20 MG tablet, Take 20 mg by mouth at bedtime., Disp: , Rfl:  .  tadalafil (CIALIS) 20 MG tablet, TAKE 1 TABLET ONCE DAILY AS NEEDED FOR ERECTILE DYSFUNCTION. DO NOT TAKE MORE THAN ONE DOSE IN 24 HOURS. DO NOT TAKE WITH NITRATES, Disp: , Rfl:  .  valsartan (DIOVAN) 320 MG tablet, TAKE 1 TABLET DAILY, Disp: , Rfl:  .  Zoledronic Acid (ZOMETA) 4 MG/100ML IVPB, Inject 4 mg into the vein. Every 4 months., Disp: , Rfl:   Allergies: No Known Allergies  Past Medical History, Surgical history, Social history, and Family History were reviewed and updated.  Review of Systems: As above  Physical Exam:  height is 5\' 11"  (1.803 m) and weight is 214 lb (97.1 kg). His oral temperature is 97.6 F (36.4 C). His blood pressure is 171/90 (abnormal) and his pulse is 57 (abnormal). His respiration is 18.   Well-developed and well-nourished African American gentleman. I repeated his blood pressure and it was 180/100 There are no ocular or oral lesions. He has no palpable cervical or supraclavicular lymph nodes. Lungs are clear. Cardiac exam regular rate and rhythm with no murmurs rubs or bruits. Abdomen is soft. He has good bowel sounds. There is no fluid wave. There is no palpable liver or spleen tip. Back exam no tenderness of the spine ribs or hips. Extremities shows no clubbing cyanosis or edema. He has good range  which it was twisted has good muscle strength. Skin exam shows no rashes, ecchymosis or petechia. Neurological exam shows no focal deficits. Lab Results  Component Value Date   WBC 4.7 01/29/2016   HGB 11.6 (L) 01/29/2016   HCT 33.1 (L) 01/29/2016   MCV 94 01/29/2016   PLT 182 01/29/2016     Chemistry      Component Value Date/Time   NA 128 01/29/2016 1205   NA 138 10/29/2015 1029   K 3.5 01/29/2016 1205   K 3.9 10/29/2015 1029   CL 94 (L) 01/29/2016 1205   CO2 30 01/29/2016 1205   CO2 27 10/29/2015 1029   BUN 11 01/29/2016 1205   BUN 13.8 10/29/2015 1029   CREATININE 0.8 01/29/2016 1205   CREATININE 1.1 10/29/2015 1029      Component Value Date/Time   CALCIUM 9.1 01/29/2016 1205   CALCIUM 9.3 10/29/2015 1029   ALKPHOS 63 01/29/2016 1205   ALKPHOS 52 10/29/2015 1029   AST 27 01/29/2016 1205   AST 20 10/29/2015 1029   ALT 16 01/29/2016 1205   ALT 14 10/29/2015 1029   BILITOT 1.00 01/29/2016 1205   BILITOT 0.75 10/29/2015 1029         Impression and Plan: Robert [name] is 82 year old gentleman with myeloma. He is IgG Kappa myeloma. He underwent stem cell transplant back in 2006.   I believe that we can watch him for right now. He actually looks quite good. His levels are very low. The bone marrow really does not show much in the way of bone marrow involvement he has normal cytogenetics. His bone survey is normal.   I will plan to get him back in about 3 months. I did this would be very reasonable.  As always, it's his blood pressure that is the bigger issue. I think he is doing a better job with this. He is losing a little weight.   Volanda Napoleon, MD 9/6/20171:05 PM

## 2016-01-30 ENCOUNTER — Encounter: Payer: Self-pay | Admitting: Hematology & Oncology

## 2016-01-30 LAB — KAPPA/LAMBDA LIGHT CHAINS
Ig Kappa Free Light Chain: 46.6 mg/L — ABNORMAL HIGH (ref 3.3–19.4)
Ig Lambda Free Light Chain: 14.6 mg/L (ref 5.7–26.3)
Kappa/Lambda FluidC Ratio: 3.19 — ABNORMAL HIGH (ref 0.26–1.65)

## 2016-02-04 LAB — MULTIPLE MYELOMA PANEL, SERUM
Albumin SerPl Elph-Mcnc: 3.9 g/dL (ref 2.9–4.4)
Albumin/Glob SerPl: 1.2 (ref 0.7–1.7)
Alpha 1: 0.2 g/dL (ref 0.0–0.4)
Alpha2 Glob SerPl Elph-Mcnc: 0.7 g/dL (ref 0.4–1.0)
B-Globulin SerPl Elph-Mcnc: 0.9 g/dL (ref 0.7–1.3)
Gamma Glob SerPl Elph-Mcnc: 1.6 g/dL (ref 0.4–1.8)
Globulin, Total: 3.3 g/dL (ref 2.2–3.9)
IgA, Qn, Serum: 113 mg/dL (ref 61–437)
IgG, Qn, Serum: 1580 mg/dL (ref 700–1600)
IgM, Qn, Serum: 149 mg/dL — ABNORMAL HIGH (ref 15–143)
M Protein SerPl Elph-Mcnc: 1 g/dL — ABNORMAL HIGH
Total Protein: 7.2 g/dL (ref 6.0–8.5)

## 2016-03-17 ENCOUNTER — Other Ambulatory Visit: Payer: Self-pay | Admitting: Family

## 2016-03-17 DIAGNOSIS — M899 Disorder of bone, unspecified: Secondary | ICD-10-CM

## 2016-03-17 DIAGNOSIS — M898X9 Other specified disorders of bone, unspecified site: Secondary | ICD-10-CM

## 2016-03-17 HISTORY — DX: Other specified disorders of bone, unspecified site: M89.8X9

## 2016-03-17 HISTORY — DX: Disorder of bone, unspecified: M89.9

## 2016-04-29 ENCOUNTER — Other Ambulatory Visit (HOSPITAL_BASED_OUTPATIENT_CLINIC_OR_DEPARTMENT_OTHER): Payer: Medicare Other

## 2016-04-29 ENCOUNTER — Ambulatory Visit (HOSPITAL_BASED_OUTPATIENT_CLINIC_OR_DEPARTMENT_OTHER): Payer: Medicare Other

## 2016-04-29 ENCOUNTER — Ambulatory Visit (HOSPITAL_BASED_OUTPATIENT_CLINIC_OR_DEPARTMENT_OTHER): Payer: Medicare Other | Admitting: Hematology & Oncology

## 2016-04-29 VITALS — BP 188/96 | HR 57 | Temp 97.6°F | Resp 20 | Wt 209.8 lb

## 2016-04-29 DIAGNOSIS — C9002 Multiple myeloma in relapse: Secondary | ICD-10-CM

## 2016-04-29 DIAGNOSIS — M899 Disorder of bone, unspecified: Secondary | ICD-10-CM

## 2016-04-29 DIAGNOSIS — C9000 Multiple myeloma not having achieved remission: Secondary | ICD-10-CM

## 2016-04-29 LAB — CMP (CANCER CENTER ONLY)
ALT(SGPT): 16 U/L (ref 10–47)
AST: 32 U/L (ref 11–38)
Albumin: 4 g/dL (ref 3.3–5.5)
Alkaline Phosphatase: 55 U/L (ref 26–84)
BUN, Bld: 13 mg/dL (ref 7–22)
CO2: 27 mEq/L (ref 18–33)
Calcium: 9.8 mg/dL (ref 8.0–10.3)
Chloride: 98 mEq/L (ref 98–108)
Creat: 1 mg/dl (ref 0.6–1.2)
Glucose, Bld: 98 mg/dL (ref 73–118)
Potassium: 3.5 mEq/L (ref 3.3–4.7)
Sodium: 136 mEq/L (ref 128–145)
Total Bilirubin: 1.1 mg/dl (ref 0.20–1.60)
Total Protein: 7.6 g/dL (ref 6.4–8.1)

## 2016-04-29 LAB — CBC WITH DIFFERENTIAL (CANCER CENTER ONLY)
BASO#: 0 10*3/uL (ref 0.0–0.2)
BASO%: 0.2 % (ref 0.0–2.0)
EOS%: 1.6 % (ref 0.0–7.0)
Eosinophils Absolute: 0.1 10*3/uL (ref 0.0–0.5)
HCT: 33.4 % — ABNORMAL LOW (ref 38.7–49.9)
HGB: 11.3 g/dL — ABNORMAL LOW (ref 13.0–17.1)
LYMPH#: 1.4 10*3/uL (ref 0.9–3.3)
LYMPH%: 33.4 % (ref 14.0–48.0)
MCH: 33.2 pg (ref 28.0–33.4)
MCHC: 33.8 g/dL (ref 32.0–35.9)
MCV: 98 fL (ref 82–98)
MONO#: 0.4 10*3/uL (ref 0.1–0.9)
MONO%: 8.2 % (ref 0.0–13.0)
NEUT#: 2.4 10*3/uL (ref 1.5–6.5)
NEUT%: 56.6 % (ref 40.0–80.0)
Platelets: 180 10*3/uL (ref 145–400)
RBC: 3.4 10*6/uL — ABNORMAL LOW (ref 4.20–5.70)
RDW: 13.1 % (ref 11.1–15.7)
WBC: 4.3 10*3/uL (ref 4.0–10.0)

## 2016-04-29 MED ORDER — ZOLEDRONIC ACID 4 MG/100ML IV SOLN
4.0000 mg | Freq: Once | INTRAVENOUS | Status: AC
  Administered 2016-04-29: 4 mg via INTRAVENOUS
  Filled 2016-04-29: qty 100

## 2016-04-29 MED ORDER — SODIUM CHLORIDE 0.9 % IV SOLN
INTRAVENOUS | Status: DC
  Administered 2016-04-29: 13:00:00 via INTRAVENOUS

## 2016-04-29 NOTE — Patient Instructions (Signed)

## 2016-04-29 NOTE — Progress Notes (Signed)
Hematology and Oncology Follow Up Visit  Robert [name] [id number] 02-04-1934 82 y.o. 04/29/2016   Principle Diagnosis:   IgG Kappa Myeloma-Relapsed  Current Therapy:    Zometa 4 mg IV q. 3 months     Interim History:  [name] is back for followup. Unfortunately, it looks like his myeloma has relapsed. We saw him back in September and his M spike was a little bit higher at 1.0 g/dL. His IgG level was 1580 mg/dL. His kappa light chain was  4.6 mg/dL.  His cytogenetics and FISH showed normal chromosomes we did his bone marrow biopsy back in May.  He is doing well. He is feeling well. He had a good Thanksgiving. He ate quite a bit. However, she's been losing a little weight.  He still needs to exercise. I keep telling hiim that if he can exercise more, his protein levels will get better. Hopefully, this will be the incentive for him.  There has not been any fever. He's had no rashes. He's had no nausea or vomiting.  We did do a bone survey on him today. The bone survey did not show any evidence of myeloma in the bones.  Overall, his performance status is ECOG 1.  Medications:  Current Outpatient Prescriptions:  .  acetaminophen (TYLENOL) 500 MG tablet, Take 1,000 mg by mouth every 6 (six) hours as needed for headache., Disp: , Rfl:  .  albuterol (PROAIR HFA) 108 (90 Base) MCG/ACT inhaler, Inhale into the lungs., Disp: , Rfl:  .  aspirin EC 81 MG tablet, Take 81 mg by mouth daily., Disp: , Rfl:  .  bimatoprost (LUMIGAN) 0.03 % ophthalmic solution, Place 1 drop into both eyes at bedtime., Disp: , Rfl:  .  Calcium Carbonate-Vit D-Min (CALTRATE 600+D PLUS PO), Take 1 tablet by mouth daily., Disp: , Rfl:  .  carvedilol (COREG) 25 MG tablet, Take 25 mg by mouth 2 (two) times daily with a meal., Disp: , Rfl:  .  Cholecalciferol (VITAMIN D3) 1000 UNITS CAPS, Take 1 capsule by mouth daily., Disp: , Rfl:  .  cloNIDine (CATAPRES) 0.1 MG tablet, Take by mouth., Disp: , Rfl:  .  cyanocobalamin  2000 MCG tablet, Take 2,000 mcg by mouth daily., Disp: , Rfl:  .  famciclovir (FAMVIR) 250 MG tablet, TAKE 1 TABLET DAILY, Disp: 90 tablet, Rfl: 2 .  fluticasone (FLONASE) 50 MCG/ACT nasal spray, Place 2 sprays into both nostrils 2 (two) times daily as needed for allergies. Place 2 sprays into both nostrils daily., Disp: , Rfl:  .  hydrochlorothiazide (HYDRODIURIL) 25 MG tablet, Take 25 mg by mouth daily., Disp: , Rfl:  .  Multiple Vitamin (MULITIVITAMIN WITH MINERALS) TABS, Take 1 tablet by mouth daily., Disp: , Rfl:  .  Polyethyl Glycol-Propyl Glycol (SYSTANE OP), Apply 1 drop to eye daily as needed (dry eyes)., Disp: , Rfl:  .  potassium chloride (KLOR-CON) 20 MEQ packet, Take 20 mEq by mouth daily., Disp: , Rfl:  .  pravastatin (PRAVACHOL) 20 MG tablet, Take 20 mg by mouth at bedtime., Disp: , Rfl:  .  tadalafil (CIALIS) 20 MG tablet, TAKE 1 TABLET ONCE DAILY AS NEEDED FOR ERECTILE DYSFUNCTION. DO NOT TAKE MORE THAN ONE DOSE IN 24 HOURS. DO NOT TAKE WITH NITRATES, Disp: , Rfl:  .  valsartan (DIOVAN) 320 MG tablet, TAKE 1 TABLET DAILY, Disp: , Rfl:  .  Zoledronic Acid (ZOMETA) 4 MG/100ML IVPB, Inject 4 mg into the vein. Every 4 months., Disp: ,  Rfl:   Allergies: No Known Allergies  Past Medical History, Surgical history, Social history, and Family History were reviewed and updated.  Review of Systems: As above  Physical Exam:  weight is 209 lb 12.8 oz (95.2 kg). His oral temperature is 97.6 F (36.4 C). His blood pressure is 188/96 (abnormal) and his pulse is 57 (abnormal). His respiration is 20.   Well-developed and well-nourished African American gentleman. I repeated his blood pressure and it was 180/100 There are no ocular or oral lesions. He has no palpable cervical or supraclavicular lymph nodes. Lungs are clear. Cardiac exam regular rate and rhythm with no murmurs rubs or bruits. Abdomen is soft. He has good bowel sounds. There is no fluid wave. There is no palpable liver or spleen  tip. Back exam no tenderness of the spine ribs or hips. Extremities shows no clubbing cyanosis or edema. He has good range which it was twisted has good muscle strength. Skin exam shows no rashes, ecchymosis or petechia. Neurological exam shows no focal deficits. Lab Results  Component Value Date   WBC 4.3 04/29/2016   HGB 11.3 (L) 04/29/2016   HCT 33.4 (L) 04/29/2016   MCV 98 04/29/2016   PLT 180 04/29/2016     Chemistry      Component Value Date/Time   NA 136 04/29/2016 1142   NA 138 10/29/2015 1029   K 3.5 04/29/2016 1142   K 3.9 10/29/2015 1029   CL 98 04/29/2016 1142   CO2 27 04/29/2016 1142   CO2 27 10/29/2015 1029   BUN 13 04/29/2016 1142   BUN 13.8 10/29/2015 1029   CREATININE 1.0 04/29/2016 1142   CREATININE 1.1 10/29/2015 1029      Component Value Date/Time   CALCIUM 9.8 04/29/2016 1142   CALCIUM 9.3 10/29/2015 1029   ALKPHOS 55 04/29/2016 1142   ALKPHOS 52 10/29/2015 1029   AST 32 04/29/2016 1142   AST 20 10/29/2015 1029   ALT 16 04/29/2016 1142   ALT 14 10/29/2015 1029   BILITOT 1.10 04/29/2016 1142   BILITOT 0.75 10/29/2015 1029         Impression and Plan: Robert [name] is 82 year old gentleman with myeloma. He is IgG Kappa myeloma. He underwent stem cell transplant back in 2006.   I believe that we can watch him for right now. He actually looks quite good. His levels are Still very low. The bone marrow really does not show much in the way of bone marrow involvement.  His blood pressure when I did in the office was 180/84. He watches his blood sugar at home every day. His family doctor is doing a great job in monitoring this.  We will go ahead and give him Zometa today.  If we find that his myeloma numbers are increasing, then we might have to move ahead and give him therapy.  I will plan to get him back in about 3 months. I did this would be very reasonable.   Volanda Napoleon, MD 12/6/20171:00 PM

## 2016-04-30 LAB — KAPPA/LAMBDA LIGHT CHAINS
Ig Kappa Free Light Chain: 58.3 mg/L — ABNORMAL HIGH (ref 3.3–19.4)
Ig Lambda Free Light Chain: 14.4 mg/L (ref 5.7–26.3)
Kappa/Lambda FluidC Ratio: 4.05 — ABNORMAL HIGH (ref 0.26–1.65)

## 2016-04-30 LAB — IGG, IGA, IGM
IgA, Qn, Serum: 114 mg/dL (ref 61–437)
IgG, Qn, Serum: 1840 mg/dL — ABNORMAL HIGH (ref 700–1600)
IgM, Qn, Serum: 152 mg/dL — ABNORMAL HIGH (ref 15–143)

## 2016-05-04 LAB — PROTEIN ELECTROPHORESIS, SERUM, WITH REFLEX
A/G Ratio: 1.1 (ref 0.7–1.7)
Albumin: 3.8 g/dL (ref 2.9–4.4)
Alpha 1: 0.2 g/dL (ref 0.0–0.4)
Alpha 2: 0.6 g/dL (ref 0.4–1.0)
Beta: 0.9 g/dL (ref 0.7–1.3)
Gamma Globulin: 1.6 g/dL (ref 0.4–1.8)
Globulin, Total: 3.4 g/dL (ref 2.2–3.9)
Interpretation(See Below): 0
M-Spike, %: 1.1 g/dL — ABNORMAL HIGH
Total Protein: 7.2 g/dL (ref 6.0–8.5)

## 2016-06-19 ENCOUNTER — Other Ambulatory Visit: Payer: Self-pay | Admitting: Hematology & Oncology

## 2016-07-01 DIAGNOSIS — C9000 Multiple myeloma not having achieved remission: Secondary | ICD-10-CM | POA: Diagnosis not present

## 2016-07-08 DIAGNOSIS — Z9481 Bone marrow transplant status: Secondary | ICD-10-CM | POA: Diagnosis not present

## 2016-07-08 DIAGNOSIS — C9000 Multiple myeloma not having achieved remission: Secondary | ICD-10-CM | POA: Diagnosis not present

## 2016-07-30 ENCOUNTER — Other Ambulatory Visit (HOSPITAL_BASED_OUTPATIENT_CLINIC_OR_DEPARTMENT_OTHER): Payer: Medicare Other

## 2016-07-30 ENCOUNTER — Ambulatory Visit (HOSPITAL_BASED_OUTPATIENT_CLINIC_OR_DEPARTMENT_OTHER): Payer: Medicare Other | Admitting: Hematology & Oncology

## 2016-07-30 ENCOUNTER — Ambulatory Visit (HOSPITAL_BASED_OUTPATIENT_CLINIC_OR_DEPARTMENT_OTHER): Payer: Medicare Other

## 2016-07-30 VITALS — BP 198/99 | HR 57 | Temp 97.4°F | Resp 18 | Wt 213.8 lb

## 2016-07-30 DIAGNOSIS — M899 Disorder of bone, unspecified: Secondary | ICD-10-CM

## 2016-07-30 DIAGNOSIS — C9000 Multiple myeloma not having achieved remission: Secondary | ICD-10-CM

## 2016-07-30 DIAGNOSIS — C9002 Multiple myeloma in relapse: Secondary | ICD-10-CM

## 2016-07-30 LAB — CMP (CANCER CENTER ONLY)
ALT(SGPT): 20 U/L (ref 10–47)
AST: 30 U/L (ref 11–38)
Albumin: 3.8 g/dL (ref 3.3–5.5)
Alkaline Phosphatase: 71 U/L (ref 26–84)
BUN, Bld: 12 mg/dL (ref 7–22)
CO2: 30 mEq/L (ref 18–33)
Calcium: 9.8 mg/dL (ref 8.0–10.3)
Chloride: 98 mEq/L (ref 98–108)
Creat: 1 mg/dl (ref 0.6–1.2)
Glucose, Bld: 100 mg/dL (ref 73–118)
Potassium: 3.7 mEq/L (ref 3.3–4.7)
Sodium: 140 mEq/L (ref 128–145)
Total Bilirubin: 0.9 mg/dl (ref 0.20–1.60)
Total Protein: 8.1 g/dL (ref 6.4–8.1)

## 2016-07-30 LAB — CBC WITH DIFFERENTIAL (CANCER CENTER ONLY)
BASO#: 0 10*3/uL (ref 0.0–0.2)
BASO%: 0.2 % (ref 0.0–2.0)
EOS%: 1.7 % (ref 0.0–7.0)
Eosinophils Absolute: 0.1 10*3/uL (ref 0.0–0.5)
HCT: 35 % — ABNORMAL LOW (ref 38.7–49.9)
HGB: 12 g/dL — ABNORMAL LOW (ref 13.0–17.1)
LYMPH#: 1.5 10*3/uL (ref 0.9–3.3)
LYMPH%: 36.5 % (ref 14.0–48.0)
MCH: 34.2 pg — ABNORMAL HIGH (ref 28.0–33.4)
MCHC: 34.3 g/dL (ref 32.0–35.9)
MCV: 100 fL — ABNORMAL HIGH (ref 82–98)
MONO#: 0.4 10*3/uL (ref 0.1–0.9)
MONO%: 10.2 % (ref 0.0–13.0)
NEUT#: 2.1 10*3/uL (ref 1.5–6.5)
NEUT%: 51.4 % (ref 40.0–80.0)
Platelets: 174 10*3/uL (ref 145–400)
RBC: 3.51 10*6/uL — ABNORMAL LOW (ref 4.20–5.70)
RDW: 13 % (ref 11.1–15.7)
WBC: 4 10*3/uL (ref 4.0–10.0)

## 2016-07-30 MED ORDER — ZOLEDRONIC ACID 4 MG/100ML IV SOLN
4.0000 mg | Freq: Once | INTRAVENOUS | Status: AC
  Administered 2016-07-30: 4 mg via INTRAVENOUS
  Filled 2016-07-30: qty 100

## 2016-07-30 NOTE — Patient Instructions (Signed)

## 2016-07-30 NOTE — Progress Notes (Signed)
Hematology and Oncology Follow Up Visit  RAFIQ BUCKLIN [id number] 1933/06/07 83 y.o. 07/30/2016   Principle Diagnosis:   IgG Kappa Myeloma-Relapsed  Current Therapy:    Zometa 4 mg IV q. 4 months     Interim History:  Mr.  Robert [name] is back for followup. He is doing okay. We know that his myeloma is slowly relapsing. We saw him back in December, his M spike was 1.1 g/dL. His IgG level was 1840 mg/dL. His kappa light chain was 5.8 mg/dL.  Of course, he has not lost weight. I keep telling him to lose weight. If not, his blood pressure will become a more chronic problem for him.  He did enjoy the Thanksgiving and Christmas holidays.  I'm going to move his Zometa to every 4 months.  He has not had any problem with infections. He has had no problems with influenza. He has had no rashes. He has had no diarrhea. He has had no cough or shortness of breath.  He does have occasional leg swelling. I think this is from his weight and also from his high blood pressure.  Overall, his performance status is ECOG 1.  Medications:  Current Outpatient Prescriptions:  .  acetaminophen (TYLENOL) 500 MG tablet, Take 1,000 mg by mouth every 6 (six) hours as needed for headache., Disp: , Rfl:  .  albuterol (PROAIR HFA) 108 (90 Base) MCG/ACT inhaler, Inhale into the lungs., Disp: , Rfl:  .  aspirin EC 81 MG tablet, Take 81 mg by mouth daily., Disp: , Rfl:  .  bimatoprost (LUMIGAN) 0.03 % ophthalmic solution, Place 1 drop into both eyes at bedtime., Disp: , Rfl:  .  Calcium Carbonate-Vit D-Min (CALTRATE 600+D PLUS PO), Take 1 tablet by mouth daily., Disp: , Rfl:  .  carvedilol (COREG) 25 MG tablet, Take 25 mg by mouth 2 (two) times daily with a meal., Disp: , Rfl:  .  Cholecalciferol (VITAMIN D3) 1000 UNITS CAPS, Take 1 capsule by mouth daily., Disp: , Rfl:  .  cloNIDine (CATAPRES) 0.1 MG tablet, Take by mouth., Disp: , Rfl:  .  cyanocobalamin 2000 MCG tablet, Take 2,000 mcg by mouth daily., Disp: , Rfl:  .   famciclovir (FAMVIR) 250 MG tablet, TAKE 1 TABLET DAILY, Disp: 90 tablet, Rfl: 2 .  fluticasone (FLONASE) 50 MCG/ACT nasal spray, Place 2 sprays into both nostrils 2 (two) times daily as needed for allergies. Place 2 sprays into both nostrils daily., Disp: , Rfl:  .  hydrochlorothiazide (HYDRODIURIL) 25 MG tablet, Take 25 mg by mouth daily., Disp: , Rfl:  .  Multiple Vitamin (MULITIVITAMIN WITH MINERALS) TABS, Take 1 tablet by mouth daily., Disp: , Rfl:  .  Polyethyl Glycol-Propyl Glycol (SYSTANE OP), Apply 1 drop to eye daily as needed (dry eyes)., Disp: , Rfl:  .  potassium chloride (KLOR-CON) 20 MEQ packet, Take 20 mEq by mouth daily., Disp: , Rfl:  .  pravastatin (PRAVACHOL) 20 MG tablet, Take 20 mg by mouth at bedtime., Disp: , Rfl:  .  tadalafil (CIALIS) 20 MG tablet, TAKE 1 TABLET ONCE DAILY AS NEEDED FOR ERECTILE DYSFUNCTION. DO NOT TAKE MORE THAN ONE DOSE IN 24 HOURS. DO NOT TAKE WITH NITRATES, Disp: , Rfl:  .  valsartan (DIOVAN) 320 MG tablet, TAKE 1 TABLET DAILY, Disp: , Rfl:  .  Zoledronic Acid (ZOMETA) 4 MG/100ML IVPB, Inject 4 mg into the vein. Every 4 months., Disp: , Rfl:   Allergies: No Known Allergies  Past Medical History, Surgical  history, Social history, and Family History were reviewed and updated.  Review of Systems: As above  Physical Exam:  weight is 213 lb 12 oz (97 kg). His oral temperature is 97.4 F (36.3 C). His blood pressure is 198/99 (abnormal) and his pulse is 57 (abnormal). His respiration is 18 and oxygen saturation is 99%.   Well-developed and well-nourished African American gentleman. I repeated his blood pressure and it was 180/100 There are no ocular or oral lesions. He has no palpable cervical or supraclavicular lymph nodes. Lungs are clear. Cardiac exam regular rate and rhythm with no murmurs rubs or bruits. Abdomen is soft. He has good bowel sounds. There is no fluid wave. There is no palpable liver or spleen tip. Back exam no tenderness of the spine  ribs or hips. Extremities shows no clubbing cyanosis or edema. He has good range which it was twisted has good muscle strength. Skin exam shows no rashes, ecchymosis or petechia. Neurological exam shows no focal deficits. Lab Results  Component Value Date   WBC 4.0 07/30/2016   HGB 12.0 (L) 07/30/2016   HCT 35.0 (L) 07/30/2016   MCV 100 (H) 07/30/2016   PLT 174 07/30/2016     Chemistry      Component Value Date/Time   NA 140 07/30/2016 1148   NA 138 10/29/2015 1029   K 3.7 07/30/2016 1148   K 3.9 10/29/2015 1029   CL 98 07/30/2016 1148   CO2 30 07/30/2016 1148   CO2 27 10/29/2015 1029   BUN 12 07/30/2016 1148   BUN 13.8 10/29/2015 1029   CREATININE 1.0 07/30/2016 1148   CREATININE 1.1 10/29/2015 1029      Component Value Date/Time   CALCIUM 9.8 07/30/2016 1148   CALCIUM 9.3 10/29/2015 1029   ALKPHOS 71 07/30/2016 1148   ALKPHOS 52 10/29/2015 1029   AST 30 07/30/2016 1148   AST 20 10/29/2015 1029   ALT 20 07/30/2016 1148   ALT 14 10/29/2015 1029   BILITOT 0.90 07/30/2016 1148   BILITOT 0.75 10/29/2015 1029         Impression and Plan: Mr. Heindel is 83 year old gentleman with myeloma. He is IgG Kappa myeloma. He underwent stem cell transplant back in 2006.   I believe that we can watch him for right now. He actually looks quite good. His levels are still very low.   I realize that his myeloma is slowly progressing. However, he really is totally asymptomatic with this.  He does have the weight issues. I keep telling him to exercise so he can lose some weight.  We will go ahead with his Zometa today. His renal function looks okay.   I will plan to get him back in 4 months now. I think this would be a good time for follow-up. months. I did this would be very reasonable.   Volanda Napoleon, MD 3/8/20181:04 PM

## 2016-07-31 LAB — IGG, IGA, IGM
IgA, Qn, Serum: 104 mg/dL (ref 61–437)
IgG, Qn, Serum: 1738 mg/dL — ABNORMAL HIGH (ref 700–1600)
IgM, Qn, Serum: 123 mg/dL (ref 15–143)

## 2016-07-31 LAB — KAPPA/LAMBDA LIGHT CHAINS
Ig Kappa Free Light Chain: 75.9 mg/L — ABNORMAL HIGH (ref 3.3–19.4)
Ig Lambda Free Light Chain: 15.2 mg/L (ref 5.7–26.3)
Kappa/Lambda FluidC Ratio: 4.99 — ABNORMAL HIGH (ref 0.26–1.65)

## 2016-08-03 LAB — PROTEIN ELECTROPHORESIS, SERUM, WITH REFLEX
A/G Ratio: 1 (ref 0.7–1.7)
Albumin: 3.7 g/dL (ref 2.9–4.4)
Alpha 1: 0.2 g/dL (ref 0.0–0.4)
Alpha 2: 0.7 g/dL (ref 0.4–1.0)
Beta: 0.9 g/dL (ref 0.7–1.3)
Gamma Globulin: 1.8 g/dL (ref 0.4–1.8)
Globulin, Total: 3.6 g/dL (ref 2.2–3.9)
Interpretation(See Below): 0
M-Spike, %: 1.1 g/dL — ABNORMAL HIGH
Total Protein: 7.3 g/dL (ref 6.0–8.5)

## 2016-08-04 ENCOUNTER — Telehealth: Payer: Self-pay | Admitting: *Deleted

## 2016-08-04 NOTE — Telephone Encounter (Addendum)
Patient is aware of results  ----- Message from Volanda Napoleon, MD sent at 08/03/2016  3:09 PM EDT ----- Call - the myeloma is holding steady!!  I think that if you lost weight the myeloma would improve!!!  pete

## 2016-09-24 DIAGNOSIS — H401132 Primary open-angle glaucoma, bilateral, moderate stage: Secondary | ICD-10-CM | POA: Diagnosis not present

## 2016-09-24 DIAGNOSIS — Z961 Presence of intraocular lens: Secondary | ICD-10-CM | POA: Diagnosis not present

## 2016-10-26 DIAGNOSIS — L720 Epidermal cyst: Secondary | ICD-10-CM | POA: Diagnosis not present

## 2016-12-03 ENCOUNTER — Other Ambulatory Visit (HOSPITAL_BASED_OUTPATIENT_CLINIC_OR_DEPARTMENT_OTHER): Payer: Medicare Other

## 2016-12-03 ENCOUNTER — Ambulatory Visit (HOSPITAL_BASED_OUTPATIENT_CLINIC_OR_DEPARTMENT_OTHER): Payer: Medicare Other

## 2016-12-03 ENCOUNTER — Ambulatory Visit (HOSPITAL_BASED_OUTPATIENT_CLINIC_OR_DEPARTMENT_OTHER): Payer: Medicare Other | Admitting: Family

## 2016-12-03 VITALS — BP 190/91 | HR 62 | Temp 98.5°F | Resp 16 | Wt 217.0 lb

## 2016-12-03 DIAGNOSIS — Z9484 Stem cells transplant status: Secondary | ICD-10-CM | POA: Diagnosis not present

## 2016-12-03 DIAGNOSIS — C9002 Multiple myeloma in relapse: Secondary | ICD-10-CM

## 2016-12-03 DIAGNOSIS — M899 Disorder of bone, unspecified: Secondary | ICD-10-CM

## 2016-12-03 DIAGNOSIS — C9000 Multiple myeloma not having achieved remission: Secondary | ICD-10-CM

## 2016-12-03 LAB — CBC WITH DIFFERENTIAL (CANCER CENTER ONLY)
BASO#: 0 10*3/uL (ref 0.0–0.2)
BASO%: 0 % (ref 0.0–2.0)
EOS%: 1.2 % (ref 0.0–7.0)
Eosinophils Absolute: 0 10*3/uL (ref 0.0–0.5)
HCT: 30.3 % — ABNORMAL LOW (ref 38.7–49.9)
HGB: 10.3 g/dL — ABNORMAL LOW (ref 13.0–17.1)
LYMPH#: 1.3 10*3/uL (ref 0.9–3.3)
LYMPH%: 39.8 % (ref 14.0–48.0)
MCH: 34.7 pg — ABNORMAL HIGH (ref 28.0–33.4)
MCHC: 34 g/dL (ref 32.0–35.9)
MCV: 102 fL — ABNORMAL HIGH (ref 82–98)
MONO#: 0.3 10*3/uL (ref 0.1–0.9)
MONO%: 10.2 % (ref 0.0–13.0)
NEUT#: 1.6 10*3/uL (ref 1.5–6.5)
NEUT%: 48.8 % (ref 40.0–80.0)
Platelets: 152 10*3/uL (ref 145–400)
RBC: 2.97 10*6/uL — ABNORMAL LOW (ref 4.20–5.70)
RDW: 13.1 % (ref 11.1–15.7)
WBC: 3.2 10*3/uL — ABNORMAL LOW (ref 4.0–10.0)

## 2016-12-03 LAB — CMP (CANCER CENTER ONLY)
ALT(SGPT): 22 U/L (ref 10–47)
AST: 28 U/L (ref 11–38)
Albumin: 3.4 g/dL (ref 3.3–5.5)
Alkaline Phosphatase: 57 U/L (ref 26–84)
BUN, Bld: 10 mg/dL (ref 7–22)
CO2: 28 mEq/L (ref 18–33)
Calcium: 9.4 mg/dL (ref 8.0–10.3)
Chloride: 104 mEq/L (ref 98–108)
Creat: 1.1 mg/dl (ref 0.6–1.2)
Glucose, Bld: 102 mg/dL (ref 73–118)
Potassium: 3.5 mEq/L (ref 3.3–4.7)
Sodium: 134 mEq/L (ref 128–145)
Total Bilirubin: 0.8 mg/dl (ref 0.20–1.60)
Total Protein: 7 g/dL (ref 6.4–8.1)

## 2016-12-03 MED ORDER — ZOLEDRONIC ACID 4 MG/100ML IV SOLN
4.0000 mg | Freq: Once | INTRAVENOUS | Status: AC
  Administered 2016-12-03: 4 mg via INTRAVENOUS
  Filled 2016-12-03: qty 100

## 2016-12-03 NOTE — Patient Instructions (Signed)

## 2016-12-03 NOTE — Progress Notes (Signed)
Hematology and Oncology Follow Up Visit  Robert [name] [id number] Jul 09, 1933 83 y.o. 12/03/2016   Principle Diagnosis:  IgG Kappa Myeloma-Relapsed  Current Therapy:   Zometa 4 mg IV q. 4 months   Interim History:  Robert [name] is here today for follow-up. He is doing well and has no complaints at this time. He states that he was seen at [facility] in February and got a good report. His lab work in March showed that his M-spike was staying at 1.1 g/dL (same as in December), IgG level was 1,738 mg/dL and kappa light chain was 7.5 mg/dL. Today's counts are pending.  His Hgb has come down a bit to 10.3 with an MCV of 102. He denies any episodes of bleeding. No bruising or petechiae.  No c/o pain or fatigue at this time.  He has had no issue with infections. No fever, chills, n/v, cough,r ash, dizziness, SOB, chest pain, palpitations, abdominal pain or changes in bowel or bladder habits.  No swelling, tenderness, numbness or tingling in his extremities at this time. No c/o pain.  He states that he has maintained a good appetite and is staying well hydrated.   ECOG Performance Status: 1 - Symptomatic but completely ambulatory  Medications:  Allergies as of 12/03/2016   No Known Allergies     Medication List       Accurate as of 12/03/16  5:49 PM. Always use your most recent med list.          acetaminophen 500 MG tablet Commonly known as:  TYLENOL Take 1,000 mg by mouth every 6 (six) hours as needed for headache.   aspirin EC 81 MG tablet Take 81 mg by mouth daily.   bimatoprost 0.03 % ophthalmic solution Commonly known as:  LUMIGAN Place 1 drop into both eyes at bedtime.   CALTRATE 600+D PLUS PO Take 1 tablet by mouth daily.   carvedilol 25 MG tablet Commonly known as:  COREG Take 25 mg by mouth 2 (two) times daily with a meal.   CIALIS 20 MG tablet Generic drug:  tadalafil TAKE 1 TABLET ONCE DAILY AS NEEDED FOR ERECTILE DYSFUNCTION. DO NOT TAKE MORE THAN ONE DOSE IN 24 HOURS. DO  NOT TAKE WITH NITRATES   cloNIDine 0.1 MG tablet Commonly known as:  CATAPRES Take by mouth.   [employer] VITAMIN B12 2000 MCG tablet Generic drug:  cyanocobalamin Take by mouth.   famciclovir 250 MG tablet Commonly known as:  FAMVIR TAKE 1 TABLET DAILY   fluticasone 50 MCG/ACT nasal spray Commonly known as:  FLONASE Place 2 sprays into both nostrils 2 (two) times daily as needed for allergies. Place 2 sprays into both nostrils daily.   hydrochlorothiazide 25 MG tablet Commonly known as:  HYDRODIURIL Take 25 mg by mouth daily.   multivitamin with minerals Tabs tablet Take 1 tablet by mouth daily.   potassium chloride 20 MEQ packet Commonly known as:  KLOR-CON Take 20 mEq by mouth daily.   pravastatin 20 MG tablet Commonly known as:  PRAVACHOL Take 20 mg by mouth at bedtime.   PROAIR HFA 108 (90 Base) MCG/ACT inhaler Generic drug:  albuterol Inhale into the lungs.   SYSTANE OP Apply 1 drop to eye daily as needed (dry eyes).   valsartan 320 MG tablet Commonly known as:  DIOVAN TAKE 1 TABLET DAILY   Vitamin D3 1000 units Caps Take 1 capsule by mouth daily.   ZOMETA 4 MG/100ML IVPB Generic drug:  Zoledronic Acid Inject 4 mg  into the vein. Every 4 months.       Allergies: No Known Allergies  Past Medical History, Surgical history, Social history, and Family History were reviewed and updated.  Review of Systems: All other 10 point review of systems is negative.   Physical Exam:  weight is 217 lb (98.4 kg). His oral temperature is 98.5 F (36.9 C). His blood pressure is 190/91 (abnormal) and his pulse is 62. His respiration is 16 and oxygen saturation is 100%.   Wt Readings from Last 3 Encounters:  12/03/16 217 lb (98.4 kg)  07/30/16 213 lb 12 oz (97 kg)  04/29/16 209 lb 12.8 oz (95.2 kg)    Ocular: Sclerae unicteric, pupils equal, round and reactive to light Ear-nose-throat: Oropharynx clear, dentition fair Lymphatic: No cervical, supraclavicular or  axillary adenopathy Lungs no rales or rhonchi, good excursion bilaterally Heart regular rate and rhythm, no murmur appreciated Abd soft, nontender, positive bowel sounds, no liver or spleen tip palpated on exam, no fluid wave MSK no focal spinal tenderness, no joint edema Neuro: non-focal, well-oriented, appropriate affect Breasts: Deferred   Lab Results  Component Value Date   WBC 3.2 (L) 12/03/2016   HGB 10.3 (L) 12/03/2016   HCT 30.3 (L) 12/03/2016   MCV 102 (H) 12/03/2016   PLT 152 12/03/2016   Lab Results  Component Value Date   FERRITIN 265 10/29/2015   IRON 93 10/29/2015   TIBC 280 10/29/2015   UIBC 187 10/29/2015   IRONPCTSAT 33 10/29/2015   Lab Results  Component Value Date   RBC 2.97 (L) 12/03/2016   Lab Results  Component Value Date   KPAFRELGTCHN 3.13 (H) 01/03/2015   LAMBDASER 1.72 01/03/2015   KAPLAMBRATIO 4.99 (H) 07/30/2016   Lab Results  Component Value Date   IGGSERUM 1,738 (H) 07/30/2016   IGA 157 01/03/2015   IGMSERUM 123 07/30/2016   Lab Results  Component Value Date   TOTALPROTELP 6.8 01/03/2015   ALBUMINELP 3.9 01/03/2015   A1GS 0.2 01/03/2015   A2GS 0.7 01/03/2015   BETS 0.4 01/03/2015   BETA2SER 0.3 01/03/2015   GAMS 1.3 01/03/2015   MSPIKE 1.1 (H) 07/30/2016   SPEI * 01/03/2015     Chemistry      Component Value Date/Time   NA 134 12/03/2016 1142   NA 138 10/29/2015 1029   K 3.5 12/03/2016 1142   K 3.9 10/29/2015 1029   CL 104 12/03/2016 1142   CO2 28 12/03/2016 1142   CO2 27 10/29/2015 1029   BUN 10 12/03/2016 1142   BUN 13.8 10/29/2015 1029   CREATININE 1.1 12/03/2016 1142   CREATININE 1.1 10/29/2015 1029      Component Value Date/Time   CALCIUM 9.4 12/03/2016 1142   CALCIUM 9.3 10/29/2015 1029   ALKPHOS 57 12/03/2016 1142   ALKPHOS 52 10/29/2015 1029   AST 28 12/03/2016 1142   AST 20 10/29/2015 1029   ALT 22 12/03/2016 1142   ALT 14 10/29/2015 1029   BILITOT 0.80 12/03/2016 1142   BILITOT 0.75 10/29/2015 1029       Impression and Plan: Robert [name] is a very pleasant 83 yo gentleman with IgG Kappa myeloma with history of stem cell transplant in 2006. He continues to do well and has no complaints at this time.  His Hgb did come down slightly to 10.3 (10.9 in February at [facility] and considered stable) with an MCV of 100. He is asymptomatic with this and denies having any episodes of bleeding.  Myeloma studies  for today are pending. We will proceed with Zometa today as planned.  We will go ahead and plan to see him back again in 4 months for repeat lab work , follow-up and infusion.  Greater than 50% of his 15 minute face to face appointment was spent counseling and coordinating care.  He is in agreement with the plan and promises to contact our office with any questions or concerns. We can certainly see him sooner if need be.   Eliezer Bottom, NP 7/12/20185:49 PM

## 2016-12-04 LAB — KAPPA/LAMBDA LIGHT CHAINS
Ig Kappa Free Light Chain: 85.2 mg/L — ABNORMAL HIGH (ref 3.3–19.4)
Ig Lambda Free Light Chain: 16.4 mg/L (ref 5.7–26.3)
Kappa/Lambda FluidC Ratio: 5.2 — ABNORMAL HIGH (ref 0.26–1.65)

## 2016-12-04 LAB — IGG, IGA, IGM
IgA, Qn, Serum: 99 mg/dL (ref 61–437)
IgG, Qn, Serum: 1636 mg/dL — ABNORMAL HIGH (ref 700–1600)
IgM, Qn, Serum: 108 mg/dL (ref 15–143)

## 2016-12-04 LAB — BETA 2 MICROGLOBULIN, SERUM: Beta-2: 2.5 mg/L — ABNORMAL HIGH (ref 0.6–2.4)

## 2016-12-10 LAB — PROTEIN ELECTROPHORESIS, SERUM, WITH REFLEX
A/G Ratio: 1.1 (ref 0.7–1.7)
Albumin: 3.6 g/dL (ref 2.9–4.4)
Alpha 1: 0.2 g/dL (ref 0.0–0.4)
Alpha 2: 0.5 g/dL (ref 0.4–1.0)
Beta: 0.8 g/dL (ref 0.7–1.3)
Gamma Globulin: 1.7 g/dL (ref 0.4–1.8)
Globulin, Total: 3.2 g/dL (ref 2.2–3.9)
Interpretation(See Below): 0
M-Spike, %: 1 g/dL — ABNORMAL HIGH
Total Protein: 6.8 g/dL (ref 6.0–8.5)

## 2016-12-14 DIAGNOSIS — L0291 Cutaneous abscess, unspecified: Secondary | ICD-10-CM | POA: Diagnosis not present

## 2016-12-28 DIAGNOSIS — C9001 Multiple myeloma in remission: Secondary | ICD-10-CM | POA: Diagnosis not present

## 2016-12-28 DIAGNOSIS — E785 Hyperlipidemia, unspecified: Secondary | ICD-10-CM | POA: Diagnosis not present

## 2016-12-28 DIAGNOSIS — R1909 Other intra-abdominal and pelvic swelling, mass and lump: Secondary | ICD-10-CM

## 2016-12-28 DIAGNOSIS — I10 Essential (primary) hypertension: Secondary | ICD-10-CM | POA: Diagnosis not present

## 2016-12-28 HISTORY — DX: Other intra-abdominal and pelvic swelling, mass and lump: R19.09

## 2016-12-29 DIAGNOSIS — E785 Hyperlipidemia, unspecified: Secondary | ICD-10-CM | POA: Diagnosis not present

## 2016-12-29 DIAGNOSIS — I10 Essential (primary) hypertension: Secondary | ICD-10-CM | POA: Diagnosis not present

## 2017-01-13 DIAGNOSIS — D369 Benign neoplasm, unspecified site: Secondary | ICD-10-CM | POA: Diagnosis not present

## 2017-03-25 DIAGNOSIS — D369 Benign neoplasm, unspecified site: Secondary | ICD-10-CM | POA: Diagnosis not present

## 2017-04-08 ENCOUNTER — Ambulatory Visit: Payer: Medicare Other

## 2017-04-08 ENCOUNTER — Other Ambulatory Visit: Payer: Self-pay

## 2017-04-08 ENCOUNTER — Ambulatory Visit (HOSPITAL_BASED_OUTPATIENT_CLINIC_OR_DEPARTMENT_OTHER): Payer: Medicare Other | Admitting: Hematology & Oncology

## 2017-04-08 ENCOUNTER — Other Ambulatory Visit (HOSPITAL_BASED_OUTPATIENT_CLINIC_OR_DEPARTMENT_OTHER): Payer: Medicare Other

## 2017-04-08 ENCOUNTER — Encounter: Payer: Self-pay | Admitting: Hematology & Oncology

## 2017-04-08 VITALS — BP 210/89 | HR 56 | Temp 97.4°F | Resp 16 | Wt 212.0 lb

## 2017-04-08 VITALS — BP 208/91 | HR 53

## 2017-04-08 DIAGNOSIS — C9000 Multiple myeloma not having achieved remission: Secondary | ICD-10-CM

## 2017-04-08 DIAGNOSIS — I10 Essential (primary) hypertension: Secondary | ICD-10-CM

## 2017-04-08 DIAGNOSIS — Z9484 Stem cells transplant status: Secondary | ICD-10-CM

## 2017-04-08 DIAGNOSIS — M899 Disorder of bone, unspecified: Secondary | ICD-10-CM

## 2017-04-08 DIAGNOSIS — C9002 Multiple myeloma in relapse: Secondary | ICD-10-CM | POA: Diagnosis not present

## 2017-04-08 LAB — CMP (CANCER CENTER ONLY)
ALT(SGPT): 21 U/L (ref 10–47)
AST: 26 U/L (ref 11–38)
Albumin: 3.7 g/dL (ref 3.3–5.5)
Alkaline Phosphatase: 66 U/L (ref 26–84)
BUN, Bld: 16 mg/dL (ref 7–22)
CO2: 30 mEq/L (ref 18–33)
Calcium: 9.3 mg/dL (ref 8.0–10.3)
Chloride: 102 mEq/L (ref 98–108)
Creat: 1.3 mg/dl — ABNORMAL HIGH (ref 0.6–1.2)
Glucose, Bld: 100 mg/dL (ref 73–118)
Potassium: 3.4 mEq/L (ref 3.3–4.7)
Sodium: 137 mEq/L (ref 128–145)
Total Bilirubin: 1 mg/dl (ref 0.20–1.60)
Total Protein: 7.9 g/dL (ref 6.4–8.1)

## 2017-04-08 LAB — CBC WITH DIFFERENTIAL (CANCER CENTER ONLY)
BASO#: 0 10*3/uL (ref 0.0–0.2)
BASO%: 0 % (ref 0.0–2.0)
EOS%: 0.9 % (ref 0.0–7.0)
Eosinophils Absolute: 0 10*3/uL (ref 0.0–0.5)
HCT: 32.3 % — ABNORMAL LOW (ref 38.7–49.9)
HGB: 11 g/dL — ABNORMAL LOW (ref 13.0–17.1)
LYMPH#: 1.5 10*3/uL (ref 0.9–3.3)
LYMPH%: 34.5 % (ref 14.0–48.0)
MCH: 34.8 pg — ABNORMAL HIGH (ref 28.0–33.4)
MCHC: 34.1 g/dL (ref 32.0–35.9)
MCV: 102 fL — ABNORMAL HIGH (ref 82–98)
MONO#: 0.4 10*3/uL (ref 0.1–0.9)
MONO%: 9.5 % (ref 0.0–13.0)
NEUT#: 2.4 10*3/uL (ref 1.5–6.5)
NEUT%: 55.1 % (ref 40.0–80.0)
Platelets: 178 10*3/uL (ref 145–400)
RBC: 3.16 10*6/uL — ABNORMAL LOW (ref 4.20–5.70)
RDW: 13 % (ref 11.1–15.7)
WBC: 4.3 10*3/uL (ref 4.0–10.0)

## 2017-04-08 MED ORDER — CLONIDINE HCL 0.1 MG PO TABS
0.2000 mg | ORAL_TABLET | Freq: Once | ORAL | Status: AC
  Administered 2017-04-08: 0.1 mg via ORAL

## 2017-04-08 MED ORDER — CLONIDINE HCL 0.1 MG PO TABS
ORAL_TABLET | ORAL | Status: AC
  Filled 2017-04-08: qty 2

## 2017-04-08 MED ORDER — SODIUM CHLORIDE 0.9 % IV SOLN
Freq: Once | INTRAVENOUS | Status: AC
  Administered 2017-04-08: 14:00:00 via INTRAVENOUS

## 2017-04-08 MED ORDER — ZOLEDRONIC ACID 4 MG/100ML IV SOLN
4.0000 mg | Freq: Once | INTRAVENOUS | Status: AC
  Administered 2017-04-08: 4 mg via INTRAVENOUS
  Filled 2017-04-08: qty 100

## 2017-04-08 NOTE — Progress Notes (Signed)
Pt refused to take .2 mg of Clonidine as ordered per Dr. Marin Olp.  He stated that he took Clonidine .1mg  at home this AM.  Patient states that he will notify his PCP today about his elevated BP.  BP's from today's visit marked down on patient's AVS for his information.  Pt without complaints at time of discharge.

## 2017-04-08 NOTE — Patient Instructions (Signed)

## 2017-04-08 NOTE — Progress Notes (Signed)
Hematology and Oncology Follow Up Visit  Robert [name] [id number] [birth year] 83 y.o. 04/08/2017   Principle Diagnosis:  IgG Kappa Myeloma-Relapsed  Current Therapy:   Zometa 4 mg IV q. 4 months   Interim History:  [name] is here today for follow-up. He is doing well and has no complaints at this time.  As always, his blood pressure is a problem.  We checked his blood pressure on multiple occasions.  His blood pressure 1 I checked it was 215/105.  He is on blood pressure medication.  I probably will have to give him some clonidine in the office today.  His myeloma has not been much of a problem.  We saw him back in July, his M spike was 1 g/dL.  His IgG level was 1636 mg/dL.  His Kappa Lightchain was 8.5 mg/dL.  He does not complain of any headache.  He has had no fatigue or weakness.  He still not losing weight.  He is still not exercising.  He has had no nausea or vomiting.  He has had no change in bowel or bladder habits.  ECOG Performance Status: 1 - Symptomatic but completely ambulatory  Medications:  Allergies as of 04/08/2017   No Known Allergies     Medication List        Accurate as of 04/08/17 12:09 PM. Always use your most recent med list.          acetaminophen 500 MG tablet Commonly known as:  TYLENOL Take 1,000 mg by mouth every 6 (six) hours as needed for headache.   aspirin EC 81 MG tablet Take 81 mg by mouth daily.   bimatoprost 0.03 % ophthalmic solution Commonly known as:  LUMIGAN Place 1 drop into both eyes at bedtime.   CALTRATE 600+D PLUS PO Take 1 tablet by mouth daily.   carvedilol 25 MG tablet Commonly known as:  COREG Take 25 mg by mouth 2 (two) times daily with a meal.   CIALIS 20 MG tablet Generic drug:  tadalafil TAKE 1 TABLET ONCE DAILY AS NEEDED FOR ERECTILE DYSFUNCTION. DO NOT TAKE MORE THAN ONE DOSE IN 24 HOURS. DO NOT TAKE WITH NITRATES   cloNIDine 0.1 MG tablet Commonly known as:  CATAPRES Take by mouth.   [employer] VITAMIN  B12 2000 MCG tablet Generic drug:  cyanocobalamin Take by mouth.   famciclovir 250 MG tablet Commonly known as:  FAMVIR TAKE 1 TABLET DAILY   fluticasone 50 MCG/ACT nasal spray Commonly known as:  FLONASE Place 2 sprays into both nostrils 2 (two) times daily as needed for allergies. Place 2 sprays into both nostrils daily.   hydrochlorothiazide 25 MG tablet Commonly known as:  HYDRODIURIL Take 25 mg by mouth daily.   multivitamin with minerals Tabs tablet Take 1 tablet by mouth daily.   potassium chloride 20 MEQ packet Commonly known as:  KLOR-CON Take 20 mEq by mouth daily.   pravastatin 20 MG tablet Commonly known as:  PRAVACHOL Take 20 mg by mouth at bedtime.   PROAIR HFA 108 (90 Base) MCG/ACT inhaler Generic drug:  albuterol Inhale into the lungs.   SYSTANE OP Apply 1 drop to eye daily as needed (dry eyes).   valsartan 320 MG tablet Commonly known as:  DIOVAN TAKE 1 TABLET DAILY   Vitamin D3 1000 units Caps Take 1 capsule by mouth daily.   ZOMETA 4 MG/100ML IVPB Generic drug:  Zoledronic Acid Inject 4 mg into the vein. Every 4 months.  Allergies: No Known Allergies  Past Medical History, Surgical history, Social history, and Family History were reviewed and updated.  Review of Systems: All other 10 point review of systems is negative.   Physical Exam:  weight is 212 lb (96.2 kg). His oral temperature is 97.4 F (36.3 C) (abnormal). His blood pressure is 212/90 (abnormal) and his pulse is 60. His respiration is 16 and oxygen saturation is 100%.   Wt Readings from Last 3 Encounters:  04/08/17 212 lb (96.2 kg)  12/03/16 217 lb (98.4 kg)  07/30/16 213 lb 12 oz (97 kg)    Ocular: Sclerae unicteric, pupils equal, round and reactive to light Ear-nose-throat: Oropharynx clear, dentition fair Lymphatic: No cervical, supraclavicular or axillary adenopathy Lungs no rales or rhonchi, good excursion bilaterally Heart regular rate and rhythm, no murmur  appreciated Abd soft, nontender, positive bowel sounds, no liver or spleen tip palpated on exam, no fluid wave MSK no focal spinal tenderness, no joint edema Neuro: non-focal, well-oriented, appropriate affect Breasts: Deferred   Lab Results  Component Value Date   WBC 4.3 04/08/2017   HGB 11.0 (L) 04/08/2017   HCT 32.3 (L) 04/08/2017   MCV 102 (H) 04/08/2017   PLT 178 04/08/2017   Lab Results  Component Value Date   FERRITIN 265 10/29/2015   IRON 93 10/29/2015   TIBC 280 10/29/2015   UIBC 187 10/29/2015   IRONPCTSAT 33 10/29/2015   Lab Results  Component Value Date   RBC 3.16 (L) 04/08/2017   Lab Results  Component Value Date   KPAFRELGTCHN 3.13 (H) 01/03/2015   LAMBDASER 1.72 01/03/2015   KAPLAMBRATIO 5.20 (H) 12/03/2016   Lab Results  Component Value Date   IGGSERUM 1,636 (H) 12/03/2016   IGA 157 01/03/2015   IGMSERUM 108 12/03/2016   Lab Results  Component Value Date   TOTALPROTELP 6.8 01/03/2015   ALBUMINELP 3.9 01/03/2015   A1GS 0.2 01/03/2015   A2GS 0.7 01/03/2015   BETS 0.4 01/03/2015   BETA2SER 0.3 01/03/2015   GAMS 1.3 01/03/2015   MSPIKE 1.0 (H) 12/03/2016   SPEI * 01/03/2015     Chemistry      Component Value Date/Time   NA 137 04/08/2017 1141   NA 138 10/29/2015 1029   K 3.4 04/08/2017 1141   K 3.9 10/29/2015 1029   CL 102 04/08/2017 1141   CO2 30 04/08/2017 1141   CO2 27 10/29/2015 1029   BUN 16 04/08/2017 1141   BUN 13.8 10/29/2015 1029   CREATININE 1.3 (H) 04/08/2017 1141   CREATININE 1.1 10/29/2015 1029      Component Value Date/Time   CALCIUM 9.3 04/08/2017 1141   CALCIUM 9.3 10/29/2015 1029   ALKPHOS 66 04/08/2017 1141   ALKPHOS 52 10/29/2015 1029   AST 26 04/08/2017 1141   AST 20 10/29/2015 1029   ALT 21 04/08/2017 1141   ALT 14 10/29/2015 1029   BILITOT 1.00 04/08/2017 1141   BILITOT 0.75 10/29/2015 1029      Impression and Plan: [name] is a very pleasant 83 yo gentleman with IgG Kappa myeloma with history of stem  cell transplant in 2006. He continues to do well and has no complaints at this time.   He will clearly need to go back to his family doctor.  Again, I think that his prognosis will be based upon blood pressure issues and whether or not these can be controlled.  His creatinine has been creeping up which to me indicates that the blood pressure  is beginning to affect his renal function.  I do not believe that the creatinine is up because of his myeloma.  We will go ahead with his Zometa today.  We will have to get him back here in another 4 months.  Again, I am sure his family doctor will do a great job in trying to control his blood pressure.   Volanda Napoleon, MD 11/15/201812:09 PM

## 2017-04-09 LAB — PROTEIN ELECTROPHORESIS, SERUM
A/G Ratio: 0.9 (ref 0.7–1.7)
Albumin: 3.7 g/dL (ref 2.9–4.4)
Alpha 1: 0.2 g/dL (ref 0.0–0.4)
Alpha 2: 0.7 g/dL (ref 0.4–1.0)
Beta: 1 g/dL (ref 0.7–1.3)
Gamma Globulin: 2.1 g/dL — ABNORMAL HIGH (ref 0.4–1.8)
Globulin, Total: 4 g/dL — ABNORMAL HIGH (ref 2.2–3.9)
M-Spike, %: 1.6 g/dL — ABNORMAL HIGH
Total Protein: 7.7 g/dL (ref 6.0–8.5)

## 2017-04-09 LAB — IGG, IGA, IGM
IgA, Qn, Serum: 90 mg/dL (ref 61–437)
IgG, Qn, Serum: 2072 mg/dL — ABNORMAL HIGH (ref 700–1600)
IgM, Qn, Serum: 116 mg/dL (ref 15–143)

## 2017-04-09 LAB — KAPPA/LAMBDA LIGHT CHAINS
Ig Kappa Free Light Chain: 88.2 mg/L — ABNORMAL HIGH (ref 3.3–19.4)
Ig Lambda Free Light Chain: 15 mg/L (ref 5.7–26.3)
Kappa/Lambda FluidC Ratio: 5.88 — ABNORMAL HIGH (ref 0.26–1.65)

## 2017-04-09 LAB — BETA 2 MICROGLOBULIN, SERUM: Beta-2: 2.5 mg/L — ABNORMAL HIGH (ref 0.6–2.4)

## 2017-04-21 DIAGNOSIS — H401132 Primary open-angle glaucoma, bilateral, moderate stage: Secondary | ICD-10-CM | POA: Diagnosis not present

## 2017-05-11 ENCOUNTER — Telehealth: Payer: Self-pay | Admitting: *Deleted

## 2017-05-11 NOTE — Telephone Encounter (Addendum)
Patient is aware of results and recommendations  ----- Message from Volanda Napoleon, MD sent at 05/10/2017  5:57 PM EST ----- Call - myeloma level is up a little more!!  Please lose weight and the myeloma will get better!!!  If not, we WILL need to start chemo early next year!!!  Pete

## 2017-06-01 ENCOUNTER — Other Ambulatory Visit: Payer: Self-pay | Admitting: Hematology & Oncology

## 2017-07-01 DIAGNOSIS — C9000 Multiple myeloma not having achieved remission: Secondary | ICD-10-CM | POA: Diagnosis not present

## 2017-07-01 DIAGNOSIS — Z9481 Bone marrow transplant status: Secondary | ICD-10-CM | POA: Diagnosis not present

## 2017-07-14 DIAGNOSIS — C9000 Multiple myeloma not having achieved remission: Secondary | ICD-10-CM | POA: Diagnosis not present

## 2017-07-14 DIAGNOSIS — Z9481 Bone marrow transplant status: Secondary | ICD-10-CM | POA: Diagnosis not present

## 2017-07-14 DIAGNOSIS — I10 Essential (primary) hypertension: Secondary | ICD-10-CM | POA: Diagnosis not present

## 2017-08-05 ENCOUNTER — Inpatient Hospital Stay: Payer: Medicare Other

## 2017-08-05 ENCOUNTER — Inpatient Hospital Stay: Payer: Medicare Other | Attending: Hematology & Oncology | Admitting: Hematology & Oncology

## 2017-08-05 VITALS — BP 212/98 | HR 56 | Temp 97.7°F | Resp 17 | Wt 214.0 lb

## 2017-08-05 DIAGNOSIS — I10 Essential (primary) hypertension: Secondary | ICD-10-CM

## 2017-08-05 DIAGNOSIS — M899 Disorder of bone, unspecified: Secondary | ICD-10-CM

## 2017-08-05 DIAGNOSIS — Z9484 Stem cells transplant status: Secondary | ICD-10-CM | POA: Diagnosis not present

## 2017-08-05 DIAGNOSIS — Z79899 Other long term (current) drug therapy: Secondary | ICD-10-CM | POA: Insufficient documentation

## 2017-08-05 DIAGNOSIS — N529 Male erectile dysfunction, unspecified: Secondary | ICD-10-CM | POA: Insufficient documentation

## 2017-08-05 DIAGNOSIS — C9002 Multiple myeloma in relapse: Secondary | ICD-10-CM | POA: Diagnosis not present

## 2017-08-05 DIAGNOSIS — Z7982 Long term (current) use of aspirin: Secondary | ICD-10-CM | POA: Insufficient documentation

## 2017-08-05 DIAGNOSIS — C9000 Multiple myeloma not having achieved remission: Secondary | ICD-10-CM

## 2017-08-05 LAB — CMP (CANCER CENTER ONLY)
ALT: 23 U/L (ref 10–47)
AST: 30 U/L (ref 11–38)
Albumin: 3.7 g/dL (ref 3.5–5.0)
Alkaline Phosphatase: 75 U/L (ref 26–84)
Anion gap: 5 (ref 5–15)
BUN: 13 mg/dL (ref 7–22)
CO2: 30 mmol/L (ref 18–33)
Calcium: 9.5 mg/dL (ref 8.0–10.3)
Chloride: 105 mmol/L (ref 98–108)
Creatinine: 1.1 mg/dL (ref 0.60–1.20)
Glucose, Bld: 110 mg/dL (ref 73–118)
Potassium: 3.5 mmol/L (ref 3.3–4.7)
Sodium: 140 mmol/L (ref 128–145)
Total Bilirubin: 1.2 mg/dL (ref 0.2–1.6)
Total Protein: 8 g/dL (ref 6.4–8.1)

## 2017-08-05 LAB — CBC WITH DIFFERENTIAL (CANCER CENTER ONLY)
Basophils Absolute: 0 10*3/uL (ref 0.0–0.1)
Basophils Relative: 0 %
Eosinophils Absolute: 0.1 10*3/uL (ref 0.0–0.5)
Eosinophils Relative: 2 %
HCT: 32 % — ABNORMAL LOW (ref 38.7–49.9)
Hemoglobin: 10.7 g/dL — ABNORMAL LOW (ref 13.0–17.1)
Lymphocytes Relative: 38 %
Lymphs Abs: 1.3 10*3/uL (ref 0.9–3.3)
MCH: 34 pg — ABNORMAL HIGH (ref 28.0–33.4)
MCHC: 33.4 g/dL (ref 32.0–35.9)
MCV: 101.6 fL — ABNORMAL HIGH (ref 82.0–98.0)
Monocytes Absolute: 0.3 10*3/uL (ref 0.1–0.9)
Monocytes Relative: 9 %
Neutro Abs: 1.8 10*3/uL (ref 1.5–6.5)
Neutrophils Relative %: 51 %
Platelet Count: 148 10*3/uL (ref 145–400)
RBC: 3.15 MIL/uL — ABNORMAL LOW (ref 4.20–5.70)
RDW: 12.7 % (ref 11.1–15.7)
WBC Count: 3.4 10*3/uL — ABNORMAL LOW (ref 4.0–10.0)

## 2017-08-05 MED ORDER — ZOLEDRONIC ACID 4 MG/100ML IV SOLN
4.0000 mg | Freq: Once | INTRAVENOUS | Status: AC
  Administered 2017-08-05: 4 mg via INTRAVENOUS
  Filled 2017-08-05: qty 100

## 2017-08-05 NOTE — Patient Instructions (Signed)
Zoledronic Acid injection (Hypercalcemia, Oncology) (Zometa) What is this medicine? ZOLEDRONIC ACID (ZOE le dron ik AS id) lowers the amount of calcium loss from bone. It is used to treat too much calcium in your blood from cancer. It is also used to prevent complications of cancer that has spread to the bone. This medicine may be used for other purposes; ask your health care provider or pharmacist if you have questions. COMMON BRAND NAME(S): Zometa What should I tell my health care provider before I take this medicine? They need to know if you have any of these conditions: -aspirin-sensitive asthma -cancer, especially if you are receiving medicines used to treat cancer -dental disease or wear dentures -infection -kidney disease -receiving corticosteroids like dexamethasone or prednisone -an unusual or allergic reaction to zoledronic acid, other medicines, foods, dyes, or preservatives -pregnant or trying to get pregnant -breast-feeding How should I use this medicine? This medicine is for infusion into a vein. It is given by a health care professional in a hospital or clinic setting. Talk to your pediatrician regarding the use of this medicine in children. Special care may be needed. Overdosage: If you think you have taken too much of this medicine contact a poison control center or emergency room at once. NOTE: This medicine is only for you. Do not share this medicine with others. What if I miss a dose? It is important not to miss your dose. Call your doctor or health care professional if you are unable to keep an appointment. What may interact with this medicine? -certain antibiotics given by injection -NSAIDs, medicines for pain and inflammation, like ibuprofen or naproxen -some diuretics like bumetanide, furosemide -teriparatide -thalidomide This list may not describe all possible interactions. Give your health care provider a list of all the medicines, herbs, non-prescription drugs,  or dietary supplements you use. Also tell them if you smoke, drink alcohol, or use illegal drugs. Some items may interact with your medicine. What should I watch for while using this medicine? Visit your doctor or health care professional for regular checkups. It may be some time before you see the benefit from this medicine. Do not stop taking your medicine unless your doctor tells you to. Your doctor may order blood tests or other tests to see how you are doing. Women should inform their doctor if they wish to become pregnant or think they might be pregnant. There is a potential for serious side effects to an unborn child. Talk to your health care professional or pharmacist for more information. You should make sure that you get enough calcium and vitamin D while you are taking this medicine. Discuss the foods you eat and the vitamins you take with your health care professional. Some people who take this medicine have severe bone, joint, and/or muscle pain. This medicine may also increase your risk for jaw problems or a broken thigh bone. Tell your doctor right away if you have severe pain in your jaw, bones, joints, or muscles. Tell your doctor if you have any pain that does not go away or that gets worse. Tell your dentist and dental surgeon that you are taking this medicine. You should not have major dental surgery while on this medicine. See your dentist to have a dental exam and fix any dental problems before starting this medicine. Take good care of your teeth while on this medicine. Make sure you see your dentist for regular follow-up appointments. What side effects may I notice from receiving this medicine? Side effects   that you should report to your doctor or health care professional as soon as possible: -allergic reactions like skin rash, itching or hives, swelling of the face, lips, or tongue -anxiety, confusion, or depression -breathing problems -changes in vision -eye pain -feeling faint  or lightheaded, falls -jaw pain, especially after dental work -mouth sores -muscle cramps, stiffness, or weakness -redness, blistering, peeling or loosening of the skin, including inside the mouth -trouble passing urine or change in the amount of urine Side effects that usually do not require medical attention (report to your doctor or health care professional if they continue or are bothersome): -bone, joint, or muscle pain -constipation -diarrhea -fever -hair loss -irritation at site where injected -loss of appetite -nausea, vomiting -stomach upset -trouble sleeping -trouble swallowing -weak or tired This list may not describe all possible side effects. Call your doctor for medical advice about side effects. You may report side effects to FDA at 1-800-FDA-1088. Where should I keep my medicine? This drug is given in a hospital or clinic and will not be stored at home. NOTE: This sheet is a summary. It may not cover all possible information. If you have questions about this medicine, talk to your doctor, pharmacist, or health care provider.  2018 Elsevier/Gold Standard (2013-10-07 14:19:39)  

## 2017-08-05 NOTE — Progress Notes (Signed)
Patient reports going to [facility] one month ago for HTN and meds were changed and added. He is taking them as prescribed. Denies any symptoms of HTN

## 2017-08-05 NOTE — Progress Notes (Signed)
Hematology and Oncology Follow Up Visit  Robert [name] [id number] 11/01/1933 84 y.o. 08/05/2017   Principle Diagnosis:  IgG Kappa Myeloma-Relapsed  Current Therapy:   Zometa 4 mg IV q. 4 months   Interim History:  [name] is here today for follow-up. He is doing well and has no complaints at this time.  No surprise, his blood pressure still quite high.  He has seen a family doctor.  He wants to see one locally.  I will see if we can get him into see Dr. Valetta Fuller.     he has not lost we heart disease or cerebrovascular disease if he does not lose weight.  I have been talking to him about this for 3 or 4 years.  I do not know if he will ever do what needs to be done.  He is already on 4 different blood pressure medications.  Today, his blood pressure was 212/98.  He did see the transplant doctors at [facility] back in February.  His M spike in their lab was 0.98 g/dL.  We last saw him in November 2018, his M spike was 1.6 g/dL.  His IgG level was 2072 mg/dL.  His Kappa Lightchain was  8.8 mg/dL.  There is been no fever.  He has had no cough.  He has had no change in bowel or bladder habits.  He has had no rashes.  He has had no leg swelling.  When he saw the transplant doctors at [facility], they do not feel that there was an indicator for treating him.  There is no fever.  He has had no headache.  There is no visual changes.  Overall, his performance status is ECOG 0. is   Medications:  Allergies as of 08/05/2017   No Known Allergies     Medication List        Accurate as of 08/05/17  2:32 PM. Always use your most recent med list.          acetaminophen 500 MG tablet Commonly known as:  TYLENOL Take 1,000 mg by mouth every 6 (six) hours as needed for headache.   aspirin EC 81 MG tablet Take 81 mg by mouth daily.   bimatoprost 0.03 % ophthalmic solution Commonly known as:  LUMIGAN Place 1 drop into both eyes at bedtime.   CALTRATE 600+D PLUS PO Take 1 tablet by mouth  daily.   carvedilol 25 MG tablet Commonly known as:  COREG Take 25 mg by mouth 2 (two) times daily with a meal.   CIALIS 20 MG tablet Generic drug:  tadalafil TAKE 1 TABLET ONCE DAILY AS NEEDED FOR ERECTILE DYSFUNCTION. DO NOT TAKE MORE THAN ONE DOSE IN 24 HOURS. DO NOT TAKE WITH NITRATES   cloNIDine 0.1 MG tablet Commonly known as:  CATAPRES Take by mouth 2 (two) times daily.   [employer] VITAMIN B12 2000 MCG tablet Generic drug:  cyanocobalamin Take by mouth.   famciclovir 250 MG tablet Commonly known as:  FAMVIR TAKE 1 TABLET DAILY   fluticasone 50 MCG/ACT nasal spray Commonly known as:  FLONASE Place 2 sprays into both nostrils 2 (two) times daily as needed for allergies. Place 2 sprays into both nostrils daily.   hydrochlorothiazide 25 MG tablet Commonly known as:  HYDRODIURIL Take 25 mg by mouth daily.   losartan 100 MG tablet Commonly known as:  COZAAR Take 100 mg by mouth daily.   multivitamin with minerals Tabs tablet Take 1 tablet by mouth daily.  potassium chloride 20 MEQ packet Commonly known as:  KLOR-CON Take 20 mEq by mouth daily.   pravastatin 20 MG tablet Commonly known as:  PRAVACHOL Take 20 mg by mouth at bedtime.   PROAIR HFA 108 (90 Base) MCG/ACT inhaler Generic drug:  albuterol Inhale into the lungs.   SYSTANE OP Apply 1 drop to eye daily as needed (dry eyes).   valsartan 320 MG tablet Commonly known as:  DIOVAN TAKE 1 TABLET DAILY   Vitamin D3 1000 units Caps Take 1 capsule by mouth daily.   ZOMETA 4 MG/100ML IVPB Generic drug:  Zoledronic Acid Inject 4 mg into the vein. Every 4 months.       Allergies: No Known Allergies  Past Medical History, Surgical history, Social history, and Family History were reviewed and updated.  Review of Systems: Review of Systems  Constitutional: Negative.   HENT: Negative.   Eyes: Negative.   Respiratory: Negative.  Negative for cough.   Cardiovascular: Negative.   Gastrointestinal: Negative.    Genitourinary: Negative.   Musculoskeletal: Negative.   Skin: Negative.   Neurological: Negative.   Endo/Heme/Allergies: Negative.   Psychiatric/Behavioral: Negative.      Physical Exam:  weight is 214 lb (97.1 kg). His oral temperature is 97.7 F (36.5 C). His blood pressure is 212/98 (abnormal) and his pulse is 56 (abnormal). His respiration is 17 and oxygen saturation is 100%.   Wt Readings from Last 3 Encounters:  08/05/17 214 lb (97.1 kg)  04/08/17 212 lb (96.2 kg)  12/03/16 217 lb (98.4 kg)    Physical Exam  Constitutional: He is oriented to person, place, and time.  HENT:  Head: Normocephalic and atraumatic.  Mouth/Throat: Oropharynx is clear and moist.  Eyes: EOM are normal. Pupils are equal, round, and reactive to light.  Neck: Normal range of motion.  Cardiovascular: Normal rate, regular rhythm and normal heart sounds.  Pulmonary/Chest: Effort normal and breath sounds normal.  Abdominal: Soft. Bowel sounds are normal.  Musculoskeletal: Normal range of motion. He exhibits no edema, tenderness or deformity.  Lymphadenopathy:    He has no cervical adenopathy.  Neurological: He is alert and oriented to person, place, and time.  Skin: Skin is warm and dry. No rash noted. No erythema.  Psychiatric: He has a normal mood and affect. His behavior is normal. Judgment and thought content normal.  Vitals reviewed.   Lab Results  Component Value Date   WBC 3.4 (L) 08/05/2017   HGB 11.0 (L) 04/08/2017   HCT 32.0 (L) 08/05/2017   MCV 101.6 (H) 08/05/2017   PLT 148 08/05/2017   Lab Results  Component Value Date   FERRITIN 265 10/29/2015   IRON 93 10/29/2015   TIBC 280 10/29/2015   UIBC 187 10/29/2015   IRONPCTSAT 33 10/29/2015   Lab Results  Component Value Date   RBC 3.15 (L) 08/05/2017   Lab Results  Component Value Date   KPAFRELGTCHN 3.13 (H) 01/03/2015   LAMBDASER 1.72 01/03/2015   KAPLAMBRATIO 5.88 (H) 04/08/2017   Lab Results  Component Value Date    IGGSERUM 2,072 (H) 04/08/2017   IGA 157 01/03/2015   IGMSERUM 116 04/08/2017   Lab Results  Component Value Date   TOTALPROTELP 6.8 01/03/2015   ALBUMINELP 3.9 01/03/2015   A1GS 0.2 01/03/2015   A2GS 0.7 01/03/2015   BETS 0.4 01/03/2015   BETA2SER 0.3 01/03/2015   GAMS 1.3 01/03/2015   MSPIKE 1.6 (H) 04/08/2017   SPEI * 01/03/2015     Chemistry  Component Value Date/Time   NA 140 08/05/2017 1147   NA 137 04/08/2017 1141   NA 138 10/29/2015 1029   K 3.5 08/05/2017 1147   K 3.4 04/08/2017 1141   K 3.9 10/29/2015 1029   CL 105 08/05/2017 1147   CL 102 04/08/2017 1141   CO2 30 08/05/2017 1147   CO2 30 04/08/2017 1141   CO2 27 10/29/2015 1029   BUN 13 08/05/2017 1147   BUN 16 04/08/2017 1141   BUN 13.8 10/29/2015 1029   CREATININE 1.10 08/05/2017 1147   CREATININE 1.3 (H) 04/08/2017 1141   CREATININE 1.1 10/29/2015 1029      Component Value Date/Time   CALCIUM 9.5 08/05/2017 1147   CALCIUM 9.3 04/08/2017 1141   CALCIUM 9.3 10/29/2015 1029   ALKPHOS 75 08/05/2017 1147   ALKPHOS 66 04/08/2017 1141   ALKPHOS 52 10/29/2015 1029   AST 30 08/05/2017 1147   AST 20 10/29/2015 1029   ALT 23 08/05/2017 1147   ALT 21 04/08/2017 1141   ALT 14 10/29/2015 1029   BILITOT 1.2 08/05/2017 1147   BILITOT 0.75 10/29/2015 1029      Impression and Plan: Mr. Debella is a very pleasant 84 yo gentleman with IgG Kappa myeloma with history of stem cell transplant in 2006.  He will get his Zometa today.  I really have doubts that he will ever do anything about his weight.  Hopefully, seeing a new doctor will help him.  I will plan to see him back in another 4 months.   Volanda Napoleon, MD 3/14/20192:32 PM

## 2017-08-06 LAB — KAPPA/LAMBDA LIGHT CHAINS
Kappa free light chain: 114.3 mg/L — ABNORMAL HIGH (ref 3.3–19.4)
Kappa, lambda light chain ratio: 8.11 — ABNORMAL HIGH (ref 0.26–1.65)
Lambda free light chains: 14.1 mg/L (ref 5.7–26.3)

## 2017-08-06 LAB — IGG, IGA, IGM
IgA: 79 mg/dL (ref 61–437)
IgG (Immunoglobin G), Serum: 2007 mg/dL — ABNORMAL HIGH (ref 700–1600)
IgM (Immunoglobulin M), Srm: 96 mg/dL (ref 15–143)

## 2017-08-06 LAB — BETA 2 MICROGLOBULIN, SERUM: Beta-2 Microglobulin: 2.3 mg/L (ref 0.6–2.4)

## 2017-08-10 LAB — PROTEIN ELECTROPHORESIS, SERUM, WITH REFLEX
A/G Ratio: 0.9 (ref 0.7–1.7)
Albumin ELP: 3.5 g/dL (ref 2.9–4.4)
Alpha-1-Globulin: 0.2 g/dL (ref 0.0–0.4)
Alpha-2-Globulin: 0.7 g/dL (ref 0.4–1.0)
Beta Globulin: 0.9 g/dL (ref 0.7–1.3)
Gamma Globulin: 2 g/dL — ABNORMAL HIGH (ref 0.4–1.8)
Globulin, Total: 3.8 g/dL (ref 2.2–3.9)
M-Spike, %: 1.6 g/dL — ABNORMAL HIGH
SPEP Interpretation: 0
Total Protein ELP: 7.3 g/dL (ref 6.0–8.5)

## 2017-09-27 DIAGNOSIS — Z1389 Encounter for screening for other disorder: Secondary | ICD-10-CM | POA: Diagnosis not present

## 2017-09-27 DIAGNOSIS — E7849 Other hyperlipidemia: Secondary | ICD-10-CM | POA: Diagnosis not present

## 2017-09-27 DIAGNOSIS — Z6829 Body mass index (BMI) 29.0-29.9, adult: Secondary | ICD-10-CM | POA: Diagnosis not present

## 2017-09-27 DIAGNOSIS — R05 Cough: Secondary | ICD-10-CM | POA: Diagnosis not present

## 2017-09-27 DIAGNOSIS — I10 Essential (primary) hypertension: Secondary | ICD-10-CM | POA: Diagnosis not present

## 2017-09-27 DIAGNOSIS — N528 Other male erectile dysfunction: Secondary | ICD-10-CM | POA: Diagnosis not present

## 2017-09-27 DIAGNOSIS — C9000 Multiple myeloma not having achieved remission: Secondary | ICD-10-CM | POA: Diagnosis not present

## 2017-09-27 DIAGNOSIS — M25562 Pain in left knee: Secondary | ICD-10-CM | POA: Diagnosis not present

## 2017-09-27 DIAGNOSIS — D229 Melanocytic nevi, unspecified: Secondary | ICD-10-CM | POA: Diagnosis not present

## 2017-10-05 ENCOUNTER — Other Ambulatory Visit: Payer: Self-pay | Admitting: Family

## 2017-10-06 DIAGNOSIS — Z6829 Body mass index (BMI) 29.0-29.9, adult: Secondary | ICD-10-CM | POA: Diagnosis not present

## 2017-10-06 DIAGNOSIS — I10 Essential (primary) hypertension: Secondary | ICD-10-CM | POA: Diagnosis not present

## 2017-10-08 DIAGNOSIS — I10 Essential (primary) hypertension: Secondary | ICD-10-CM | POA: Diagnosis not present

## 2017-10-28 DIAGNOSIS — L821 Other seborrheic keratosis: Secondary | ICD-10-CM | POA: Diagnosis not present

## 2017-10-28 DIAGNOSIS — L820 Inflamed seborrheic keratosis: Secondary | ICD-10-CM | POA: Diagnosis not present

## 2017-10-28 DIAGNOSIS — D225 Melanocytic nevi of trunk: Secondary | ICD-10-CM | POA: Diagnosis not present

## 2017-10-28 DIAGNOSIS — D485 Neoplasm of uncertain behavior of skin: Secondary | ICD-10-CM | POA: Diagnosis not present

## 2017-11-10 DIAGNOSIS — H401132 Primary open-angle glaucoma, bilateral, moderate stage: Secondary | ICD-10-CM | POA: Diagnosis not present

## 2017-11-10 DIAGNOSIS — Z961 Presence of intraocular lens: Secondary | ICD-10-CM | POA: Diagnosis not present

## 2017-12-02 DIAGNOSIS — Z6829 Body mass index (BMI) 29.0-29.9, adult: Secondary | ICD-10-CM | POA: Diagnosis not present

## 2017-12-02 DIAGNOSIS — N528 Other male erectile dysfunction: Secondary | ICD-10-CM | POA: Diagnosis not present

## 2017-12-02 DIAGNOSIS — G4709 Other insomnia: Secondary | ICD-10-CM | POA: Diagnosis not present

## 2017-12-02 DIAGNOSIS — E7849 Other hyperlipidemia: Secondary | ICD-10-CM | POA: Diagnosis not present

## 2017-12-02 DIAGNOSIS — M25562 Pain in left knee: Secondary | ICD-10-CM | POA: Diagnosis not present

## 2017-12-02 DIAGNOSIS — I10 Essential (primary) hypertension: Secondary | ICD-10-CM | POA: Diagnosis not present

## 2017-12-09 ENCOUNTER — Inpatient Hospital Stay: Payer: Medicare Other

## 2017-12-09 ENCOUNTER — Inpatient Hospital Stay: Payer: Medicare Other | Attending: Hematology & Oncology | Admitting: Hematology & Oncology

## 2017-12-09 ENCOUNTER — Other Ambulatory Visit: Payer: Self-pay

## 2017-12-09 ENCOUNTER — Encounter: Payer: Self-pay | Admitting: Hematology & Oncology

## 2017-12-09 VITALS — BP 182/87 | HR 59 | Temp 98.0°F | Resp 16 | Wt 212.0 lb

## 2017-12-09 DIAGNOSIS — C9002 Multiple myeloma in relapse: Secondary | ICD-10-CM | POA: Insufficient documentation

## 2017-12-09 DIAGNOSIS — Z7982 Long term (current) use of aspirin: Secondary | ICD-10-CM | POA: Insufficient documentation

## 2017-12-09 DIAGNOSIS — D649 Anemia, unspecified: Secondary | ICD-10-CM | POA: Diagnosis not present

## 2017-12-09 DIAGNOSIS — C9000 Multiple myeloma not having achieved remission: Secondary | ICD-10-CM

## 2017-12-09 DIAGNOSIS — Z9484 Stem cells transplant status: Secondary | ICD-10-CM | POA: Diagnosis not present

## 2017-12-09 DIAGNOSIS — Z79899 Other long term (current) drug therapy: Secondary | ICD-10-CM | POA: Diagnosis not present

## 2017-12-09 DIAGNOSIS — M899 Disorder of bone, unspecified: Secondary | ICD-10-CM

## 2017-12-09 LAB — CMP (CANCER CENTER ONLY)
ALT: 24 U/L (ref 10–47)
AST: 30 U/L (ref 11–38)
Albumin: 3.5 g/dL (ref 3.5–5.0)
Alkaline Phosphatase: 57 U/L (ref 26–84)
Anion gap: 2 — ABNORMAL LOW (ref 5–15)
BUN: 14 mg/dL (ref 7–22)
CO2: 30 mmol/L (ref 18–33)
Calcium: 9.4 mg/dL (ref 8.0–10.3)
Chloride: 102 mmol/L (ref 98–108)
Creatinine: 1.2 mg/dL (ref 0.60–1.20)
Glucose, Bld: 101 mg/dL (ref 73–118)
Potassium: 3.8 mmol/L (ref 3.3–4.7)
Sodium: 134 mmol/L (ref 128–145)
Total Bilirubin: 1.1 mg/dL (ref 0.2–1.6)
Total Protein: 7.5 g/dL (ref 6.4–8.1)

## 2017-12-09 LAB — CBC WITH DIFFERENTIAL (CANCER CENTER ONLY)
Basophils Absolute: 0 10*3/uL (ref 0.0–0.1)
Basophils Relative: 0 %
Eosinophils Absolute: 0 10*3/uL (ref 0.0–0.5)
Eosinophils Relative: 1 %
HCT: 28.7 % — ABNORMAL LOW (ref 38.7–49.9)
Hemoglobin: 9.7 g/dL — ABNORMAL LOW (ref 13.0–17.1)
Lymphocytes Relative: 39 %
Lymphs Abs: 1.3 10*3/uL (ref 0.9–3.3)
MCH: 34.8 pg — ABNORMAL HIGH (ref 28.0–33.4)
MCHC: 33.8 g/dL (ref 32.0–35.9)
MCV: 102.9 fL — ABNORMAL HIGH (ref 82.0–98.0)
Monocytes Absolute: 0.3 10*3/uL (ref 0.1–0.9)
Monocytes Relative: 9 %
Neutro Abs: 1.8 10*3/uL (ref 1.5–6.5)
Neutrophils Relative %: 51 %
Platelet Count: 163 10*3/uL (ref 145–400)
RBC: 2.79 MIL/uL — ABNORMAL LOW (ref 4.20–5.70)
RDW: 13.5 % (ref 11.1–15.7)
WBC Count: 3.5 10*3/uL — ABNORMAL LOW (ref 4.0–10.0)

## 2017-12-09 MED ORDER — SODIUM CHLORIDE 0.9 % IV SOLN
Freq: Once | INTRAVENOUS | Status: AC
  Administered 2017-12-09: 15:00:00 via INTRAVENOUS

## 2017-12-09 MED ORDER — ZOLEDRONIC ACID 4 MG/100ML IV SOLN
4.0000 mg | Freq: Once | INTRAVENOUS | Status: AC
  Administered 2017-12-09: 4 mg via INTRAVENOUS
  Filled 2017-12-09: qty 100

## 2017-12-09 NOTE — Progress Notes (Signed)
Hematology and Oncology Follow Up Visit  Robert [name] [id number] [birth year] 84 y.o. 12/09/2017   Principle Diagnosis:  IgG Kappa Myeloma-Relapsed  Current Therapy:   Zometa 4 mg IV q. 4 months   Interim History:  Robert [name] is here today for follow-up.  He finally has started seeing a internal medicine doctor.  He is seeing Dr. Valetta Fuller.  His blood pressure is still on the high side.  We took it in the office today after I examined him and his blood pressure was 220/110.  He has some clonidine with him that he took.  He is more anemic.  Today, his hemoglobin is 9.7.  I worry that we are looking at either his myeloma worsening or the possibility of him developing myelodysplasia.  Again, with him having the transplant about 10 years ago, he is now at the time point in which he certainly could develop myelodysplasia.  I believe that he is going to need a bone marrow biopsy.  I think this is going to be critical for [place] to figure out how we can reverse this anemia.  When we last saw him back in March, his M spike was 1.6 g/dL.  His IgG level was 2007 mg/dL.  His Kappa Lightchain was 11.4 mg/dL.  I talked him at length about all of this.  I spent over 35 minutes with him face-to-face going through his labs and explaining what the issues are and why a bone marrow test is necessary.  He does understand this.  We will try to set this up for a couple weeks.  Otherwise, he does seem to be feeling pretty well.  He is really had no specific complaints.  He has had no fever.  He is eating fairly well.  There is no change in bowel or bladder habits.  Overall, his performance status is ECOG 0. is   Medications:  Allergies as of 12/09/2017   No Known Allergies     Medication List        Accurate as of 12/09/17  2:11 PM. Always use your most recent med list.          acetaminophen 500 MG tablet Commonly known as:  TYLENOL Take 1,000 mg by mouth every 6 (six) hours as needed for headache.   aspirin EC 81 MG tablet Take 81 mg by mouth daily.   bimatoprost 0.03 % ophthalmic solution Commonly known as:  LUMIGAN Place 1 drop into both eyes at bedtime.   CALTRATE 600+D PLUS PO Take 1 tablet by mouth daily.   carvedilol 25 MG tablet Commonly known as:  COREG Take 25 mg by mouth 2 (two) times daily with a meal.   CIALIS 20 MG tablet Generic drug:  tadalafil TAKE 1 TABLET ONCE DAILY AS NEEDED FOR ERECTILE DYSFUNCTION. DO NOT TAKE MORE THAN ONE DOSE IN 24 HOURS. DO NOT TAKE WITH NITRATES   cloNIDine 0.1 MG tablet Commonly known as:  CATAPRES Take by mouth 2 (two) times daily.   [employer]S VITAMIN B12 2000 MCG tablet Generic drug:  cyanocobalamin Take by mouth.   famciclovir 250 MG tablet Commonly known as:  FAMVIR TAKE 1 TABLET DAILY   fluticasone 50 MCG/ACT nasal spray Commonly known as:  FLONASE Place 2 sprays into both nostrils 2 (two) times daily as needed for allergies. Place 2 sprays into both nostrils daily.   hydrochlorothiazide 25 MG tablet Commonly known as:  HYDRODIURIL Take 25 mg by mouth daily.   losartan 100 MG tablet  Commonly known as:  COZAAR Take 100 mg by mouth daily.   multivitamin with minerals Tabs tablet Take 1 tablet by mouth daily.   potassium chloride 20 MEQ packet Commonly known as:  KLOR-CON Take 20 mEq by mouth daily.   pravastatin 20 MG tablet Commonly known as:  PRAVACHOL Take 20 mg by mouth at bedtime.   PROAIR HFA 108 (90 Base) MCG/ACT inhaler Generic drug:  albuterol Inhale into the lungs.   SYSTANE OP Apply 1 drop to eye daily as needed (dry eyes).   valsartan 320 MG tablet Commonly known as:  DIOVAN TAKE 1 TABLET DAILY   Vitamin D3 1000 units Caps Take 1 capsule by mouth daily.   ZOMETA 4 MG/100ML IVPB Generic drug:  Zoledronic Acid Inject 4 mg into the vein. Every 4 months.       Allergies: No Known Allergies  Past Medical History, Surgical history, Social history, and Family History were reviewed and  updated.  Review of Systems: Review of Systems  Constitutional: Negative.   HENT: Negative.   Eyes: Negative.   Respiratory: Negative.  Negative for cough.   Cardiovascular: Negative.   Gastrointestinal: Negative.   Genitourinary: Negative.   Musculoskeletal: Negative.   Skin: Negative.   Neurological: Negative.   Endo/Heme/Allergies: Negative.   Psychiatric/Behavioral: Negative.      Physical Exam:  weight is 212 lb (96.2 kg). His oral temperature is 98 F (36.7 C). His blood pressure is 182/87 (abnormal) and his pulse is 59 (abnormal). His respiration is 16 and oxygen saturation is 100%.   Wt Readings from Last 3 Encounters:  12/09/17 212 lb (96.2 kg)  08/05/17 214 lb (97.1 kg)  04/08/17 212 lb (96.2 kg)    Physical Exam  Constitutional: He is oriented to person, place, and time.  HENT:  Head: Normocephalic and atraumatic.  Mouth/Throat: Oropharynx is clear and moist.  Eyes: Pupils are equal, round, and reactive to light. EOM are normal.  Neck: Normal range of motion.  Cardiovascular: Normal rate, regular rhythm and normal heart sounds.  Pulmonary/Chest: Effort normal and breath sounds normal.  Abdominal: Soft. Bowel sounds are normal.  Musculoskeletal: Normal range of motion. He exhibits no edema, tenderness or deformity.  Lymphadenopathy:    He has no cervical adenopathy.  Neurological: He is alert and oriented to person, place, and time.  Skin: Skin is warm and dry. No rash noted. No erythema.  Psychiatric: He has a normal mood and affect. His behavior is normal. Judgment and thought content normal.  Vitals reviewed.   Lab Results  Component Value Date   WBC 3.5 (L) 12/09/2017   HGB 9.7 (L) 12/09/2017   HCT 28.7 (L) 12/09/2017   MCV 102.9 (H) 12/09/2017   PLT 163 12/09/2017   Lab Results  Component Value Date   FERRITIN 265 10/29/2015   IRON 93 10/29/2015   TIBC 280 10/29/2015   UIBC 187 10/29/2015   IRONPCTSAT 33 10/29/2015   Lab Results    Component Value Date   RBC 2.79 (L) 12/09/2017   Lab Results  Component Value Date   KPAFRELGTCHN 114.3 (H) 08/05/2017   LAMBDASER 14.1 08/05/2017   KAPLAMBRATIO 8.11 (H) 08/05/2017   Lab Results  Component Value Date   IGGSERUM 2,007 (H) 08/05/2017   IGA 79 08/05/2017   IGMSERUM 96 08/05/2017   Lab Results  Component Value Date   TOTALPROTELP 7.3 08/05/2017   ALBUMINELP 3.5 08/05/2017   A1GS 0.2 08/05/2017   A2GS 0.7 08/05/2017   BETS 0.9  08/05/2017   BETA2SER 0.3 01/03/2015   GAMS 2.0 (H) 08/05/2017   MSPIKE 1.6 (H) 08/05/2017   SPEI * 01/03/2015     Chemistry      Component Value Date/Time   NA 134 12/09/2017 1159   NA 137 04/08/2017 1141   NA 138 10/29/2015 1029   K 3.8 12/09/2017 1159   K 3.4 04/08/2017 1141   K 3.9 10/29/2015 1029   CL 102 12/09/2017 1159   CL 102 04/08/2017 1141   CO2 30 12/09/2017 1159   CO2 30 04/08/2017 1141   CO2 27 10/29/2015 1029   BUN 14 12/09/2017 1159   BUN 16 04/08/2017 1141   BUN 13.8 10/29/2015 1029   CREATININE 1.20 12/09/2017 1159   CREATININE 1.3 (H) 04/08/2017 1141   CREATININE 1.1 10/29/2015 1029      Component Value Date/Time   CALCIUM 9.4 12/09/2017 1159   CALCIUM 9.3 04/08/2017 1141   CALCIUM 9.3 10/29/2015 1029   ALKPHOS 57 12/09/2017 1159   ALKPHOS 66 04/08/2017 1141   ALKPHOS 52 10/29/2015 1029   AST 30 12/09/2017 1159   AST 20 10/29/2015 1029   ALT 24 12/09/2017 1159   ALT 21 04/08/2017 1141   ALT 14 10/29/2015 1029   BILITOT 1.1 12/09/2017 1159   BILITOT 0.75 10/29/2015 1029      Impression and Plan: Mr. Rathje is a very pleasant 84 yo gentleman with IgG Kappa myeloma with history of stem cell transplant in 2006.  Again, I think we have an issue with respect to his anemia.  We really need to get this bone marrow biopsy done.  I will try to set up for a couple weeks.  The cytogenetic studies will be critical from my point of view.  I will see him back in 1 month.  When we see him back, we will have  to talk about how we can treat him.  This will be either myeloma therapy or myelodysplastic treatment protocols.     Volanda Napoleon, MD 7/18/20192:11 PM

## 2017-12-09 NOTE — Patient Instructions (Signed)

## 2017-12-10 LAB — IGG, IGA, IGM
IgA: 73 mg/dL (ref 61–437)
IgG (Immunoglobin G), Serum: 2193 mg/dL — ABNORMAL HIGH (ref 700–1600)
IgM (Immunoglobulin M), Srm: 88 mg/dL (ref 15–143)

## 2017-12-10 LAB — KAPPA/LAMBDA LIGHT CHAINS
Kappa free light chain: 127.4 mg/L — ABNORMAL HIGH (ref 3.3–19.4)
Kappa, lambda light chain ratio: 9.23 — ABNORMAL HIGH (ref 0.26–1.65)
Lambda free light chains: 13.8 mg/L (ref 5.7–26.3)

## 2017-12-13 LAB — PROTEIN ELECTROPHORESIS, SERUM, WITH REFLEX
A/G Ratio: 0.9 (ref 0.7–1.7)
Albumin ELP: 3.5 g/dL (ref 2.9–4.4)
Alpha-1-Globulin: 0.2 g/dL (ref 0.0–0.4)
Alpha-2-Globulin: 0.6 g/dL (ref 0.4–1.0)
Beta Globulin: 0.9 g/dL (ref 0.7–1.3)
Gamma Globulin: 2.1 g/dL — ABNORMAL HIGH (ref 0.4–1.8)
Globulin, Total: 3.7 g/dL (ref 2.2–3.9)
M-Spike, %: 1.7 g/dL — ABNORMAL HIGH
SPEP Interpretation: 0
Total Protein ELP: 7.2 g/dL (ref 6.0–8.5)

## 2017-12-13 LAB — IMMUNOFIXATION REFLEX, SERUM
IgA: 78 mg/dL (ref 61–437)
IgG (Immunoglobin G), Serum: 2240 mg/dL — ABNORMAL HIGH (ref 700–1600)
IgM (Immunoglobulin M), Srm: 92 mg/dL (ref 15–143)

## 2017-12-23 ENCOUNTER — Other Ambulatory Visit: Payer: Self-pay | Admitting: Student

## 2017-12-23 ENCOUNTER — Other Ambulatory Visit: Payer: Self-pay | Admitting: Radiology

## 2017-12-24 ENCOUNTER — Observation Stay (HOSPITAL_COMMUNITY)
Admission: EM | Admit: 2017-12-24 | Discharge: 2017-12-25 | Disposition: A | Discharge status: Home / Self Care | Payer: Medicare Other | Attending: Internal Medicine | Admitting: Internal Medicine

## 2017-12-24 ENCOUNTER — Emergency Department (HOSPITAL_COMMUNITY): Payer: Medicare Other

## 2017-12-24 ENCOUNTER — Ambulatory Visit (HOSPITAL_COMMUNITY)
Admission: RE | Admit: 2017-12-24 | Discharge: 2017-12-24 | Disposition: A | Discharge status: Home / Self Care | Payer: Medicare Other | Source: Ambulatory Visit | Attending: Hematology & Oncology | Admitting: Hematology & Oncology

## 2017-12-24 ENCOUNTER — Other Ambulatory Visit: Payer: Self-pay

## 2017-12-24 ENCOUNTER — Encounter (HOSPITAL_COMMUNITY): Payer: Self-pay

## 2017-12-24 DIAGNOSIS — C9002 Multiple myeloma in relapse: Secondary | ICD-10-CM | POA: Insufficient documentation

## 2017-12-24 DIAGNOSIS — R531 Weakness: Secondary | ICD-10-CM | POA: Diagnosis not present

## 2017-12-24 DIAGNOSIS — E785 Hyperlipidemia, unspecified: Secondary | ICD-10-CM | POA: Diagnosis not present

## 2017-12-24 DIAGNOSIS — D4989 Neoplasm of unspecified behavior of other specified sites: Secondary | ICD-10-CM | POA: Diagnosis not present

## 2017-12-24 DIAGNOSIS — Z7982 Long term (current) use of aspirin: Secondary | ICD-10-CM | POA: Insufficient documentation

## 2017-12-24 DIAGNOSIS — Z87891 Personal history of nicotine dependence: Secondary | ICD-10-CM | POA: Insufficient documentation

## 2017-12-24 DIAGNOSIS — D649 Anemia, unspecified: Secondary | ICD-10-CM | POA: Diagnosis not present

## 2017-12-24 DIAGNOSIS — C9000 Multiple myeloma not having achieved remission: Secondary | ICD-10-CM

## 2017-12-24 DIAGNOSIS — R5383 Other fatigue: Secondary | ICD-10-CM | POA: Diagnosis not present

## 2017-12-24 DIAGNOSIS — Z9484 Stem cells transplant status: Secondary | ICD-10-CM | POA: Insufficient documentation

## 2017-12-24 DIAGNOSIS — I672 Cerebral atherosclerosis: Secondary | ICD-10-CM | POA: Diagnosis not present

## 2017-12-24 DIAGNOSIS — Z79899 Other long term (current) drug therapy: Secondary | ICD-10-CM | POA: Insufficient documentation

## 2017-12-24 DIAGNOSIS — D539 Nutritional anemia, unspecified: Secondary | ICD-10-CM | POA: Diagnosis not present

## 2017-12-24 DIAGNOSIS — I10 Essential (primary) hypertension: Secondary | ICD-10-CM | POA: Diagnosis not present

## 2017-12-24 DIAGNOSIS — R55 Syncope and collapse: Secondary | ICD-10-CM | POA: Diagnosis not present

## 2017-12-24 DIAGNOSIS — I739 Peripheral vascular disease, unspecified: Secondary | ICD-10-CM | POA: Diagnosis not present

## 2017-12-24 DIAGNOSIS — Z9481 Bone marrow transplant status: Secondary | ICD-10-CM | POA: Insufficient documentation

## 2017-12-24 DIAGNOSIS — I440 Atrioventricular block, first degree: Secondary | ICD-10-CM | POA: Diagnosis not present

## 2017-12-24 DIAGNOSIS — I4430 Unspecified atrioventricular block: Secondary | ICD-10-CM | POA: Diagnosis not present

## 2017-12-24 DIAGNOSIS — Z9842 Cataract extraction status, left eye: Secondary | ICD-10-CM | POA: Diagnosis not present

## 2017-12-24 HISTORY — DX: Syncope and collapse: R55

## 2017-12-24 LAB — PROTIME-INR
INR: 1.03
Prothrombin Time: 13.4 seconds (ref 11.4–15.2)

## 2017-12-24 LAB — COMPREHENSIVE METABOLIC PANEL
ALT: 15 U/L (ref 0–44)
AST: 23 U/L (ref 15–41)
Albumin: 3.7 g/dL (ref 3.5–5.0)
Alkaline Phosphatase: 62 U/L (ref 38–126)
Anion gap: 7 (ref 5–15)
BUN: 16 mg/dL (ref 8–23)
CO2: 27 mmol/L (ref 22–32)
Calcium: 9.6 mg/dL (ref 8.9–10.3)
Chloride: 105 mmol/L (ref 98–111)
Creatinine, Ser: 1.16 mg/dL (ref 0.61–1.24)
GFR calc Af Amer: >60 mL/min (ref >60)
GFR calc non Af Amer: 57 mL/min — ABNORMAL LOW (ref >60)
Glucose, Bld: 130 mg/dL — ABNORMAL HIGH (ref 70–99)
Potassium: 3.9 mmol/L (ref 3.5–5.1)
Sodium: 139 mmol/L (ref 135–145)
Total Bilirubin: 1.2 mg/dL (ref 0.3–1.2)
Total Protein: 7.4 g/dL (ref 6.5–8.1)

## 2017-12-24 LAB — TSH: TSH: 1.614 u[IU]/mL (ref 0.350–4.500)

## 2017-12-24 LAB — CBC
HCT: 32.7 % — ABNORMAL LOW (ref 39.0–52.0)
Hemoglobin: 11.1 g/dL — ABNORMAL LOW (ref 13.0–17.0)
MCH: 34.7 pg — ABNORMAL HIGH (ref 26.0–34.0)
MCHC: 33.9 g/dL (ref 30.0–36.0)
MCV: 102.2 fL — ABNORMAL HIGH (ref 78.0–100.0)
Platelets: 188 10*3/uL (ref 150–400)
RBC: 3.2 MIL/uL — ABNORMAL LOW (ref 4.22–5.81)
RDW: 14.2 % (ref 11.5–15.5)
WBC: 4 10*3/uL (ref 4.0–10.5)

## 2017-12-24 LAB — CBC WITH DIFFERENTIAL/PLATELET
Abs Immature Granulocytes: 0 10*3/uL (ref 0.0–0.1)
Basophils Absolute: 0 10*3/uL (ref 0.0–0.1)
Basophils Relative: 0 %
Eosinophils Absolute: 0 10*3/uL (ref 0.0–0.7)
Eosinophils Relative: 1 %
HCT: 32.2 % — ABNORMAL LOW (ref 39.0–52.0)
Hemoglobin: 10.6 g/dL — ABNORMAL LOW (ref 13.0–17.0)
Immature Granulocytes: 0 %
Lymphocytes Relative: 21 %
Lymphs Abs: 1.5 10*3/uL (ref 0.7–4.0)
MCH: 34.3 pg — ABNORMAL HIGH (ref 26.0–34.0)
MCHC: 32.9 g/dL (ref 30.0–36.0)
MCV: 104.2 fL — ABNORMAL HIGH (ref 78.0–100.0)
Monocytes Absolute: 0.4 10*3/uL (ref 0.1–1.0)
Monocytes Relative: 6 %
Neutro Abs: 5.3 10*3/uL (ref 1.7–7.7)
Neutrophils Relative %: 72 %
Platelets: 171 10*3/uL (ref 150–400)
RBC: 3.09 MIL/uL — ABNORMAL LOW (ref 4.22–5.81)
RDW: 13.8 % (ref 11.5–15.5)
WBC: 7.4 10*3/uL (ref 4.0–10.5)

## 2017-12-24 LAB — MAGNESIUM: Magnesium: 2 mg/dL (ref 1.7–2.4)

## 2017-12-24 LAB — HEMOGLOBIN A1C
Hgb A1c MFr Bld: 6 % — ABNORMAL HIGH (ref 4.8–5.6)
Mean Plasma Glucose: 125.5 mg/dL

## 2017-12-24 LAB — TROPONIN I
Troponin I: <0.03 ng/mL (ref <0.03)
Troponin I: <0.03 ng/mL (ref <0.03)

## 2017-12-24 LAB — APTT: aPTT: 25 seconds (ref 24–36)

## 2017-12-24 MED ORDER — FENTANYL CITRATE (PF) 100 MCG/2ML IJ SOLN
INTRAMUSCULAR | Status: AC
  Filled 2017-12-24: qty 4

## 2017-12-24 MED ORDER — FLUMAZENIL 0.5 MG/5ML IV SOLN
INTRAVENOUS | Status: AC
  Filled 2017-12-24: qty 5

## 2017-12-24 MED ORDER — ACETAMINOPHEN 325 MG PO TABS: 650.0000 mg | ORAL_TABLET | Freq: Four times a day (QID) | ORAL | Status: DC | PRN

## 2017-12-24 MED ORDER — SODIUM CHLORIDE 0.9 % IV SOLN
INTRAVENOUS | Status: DC
  Administered 2017-12-24: 09:00:00 via INTRAVENOUS

## 2017-12-24 MED ORDER — FENTANYL CITRATE (PF) 100 MCG/2ML IJ SOLN
INTRAMUSCULAR | Status: AC | PRN
  Administered 2017-12-24 (×2): 50 ug via INTRAVENOUS

## 2017-12-24 MED ORDER — MIDAZOLAM HCL 2 MG/2ML IJ SOLN
INTRAMUSCULAR | Status: AC | PRN
  Administered 2017-12-24 (×2): 1 mg via INTRAVENOUS

## 2017-12-24 MED ORDER — NALOXONE HCL 0.4 MG/ML IJ SOLN
INTRAMUSCULAR | Status: AC
  Filled 2017-12-24: qty 1

## 2017-12-24 MED ORDER — PRAVASTATIN SODIUM 20 MG PO TABS
20.0000 mg | ORAL_TABLET | Freq: Every day | ORAL | Status: DC
  Administered 2017-12-24: 20 mg via ORAL
  Filled 2017-12-24: qty 1

## 2017-12-24 MED ORDER — SODIUM CHLORIDE 0.9 % IV BOLUS
1000.0000 mL | Freq: Once | INTRAVENOUS | Status: AC
  Administered 2017-12-24: 1000 mL via INTRAVENOUS

## 2017-12-24 MED ORDER — MIDAZOLAM HCL 2 MG/2ML IJ SOLN
INTRAMUSCULAR | Status: AC
  Filled 2017-12-24: qty 4

## 2017-12-24 MED ORDER — HYDRALAZINE HCL 20 MG/ML IJ SOLN
5.0000 mg | Freq: Four times a day (QID) | INTRAMUSCULAR | Status: DC | PRN
  Administered 2017-12-25: 5 mg via INTRAVENOUS
  Filled 2017-12-24: qty 1

## 2017-12-24 MED ORDER — CARVEDILOL 25 MG PO TABS
25.0000 mg | ORAL_TABLET | Freq: Two times a day (BID) | ORAL | Status: DC
  Administered 2017-12-24 – 2017-12-25 (×2): 25 mg via ORAL
  Filled 2017-12-24 (×2): qty 1

## 2017-12-24 MED ORDER — ACETAMINOPHEN 650 MG RE SUPP: 650.0000 mg | Freq: Four times a day (QID) | RECTAL | Status: DC | PRN

## 2017-12-24 MED ORDER — SENNOSIDES-DOCUSATE SODIUM 8.6-50 MG PO TABS: 1.0000 | ORAL_TABLET | Freq: Every evening | ORAL | Status: DC | PRN

## 2017-12-24 MED ORDER — LIDOCAINE HCL (PF) 1 % IJ SOLN
INTRAMUSCULAR | Status: AC | PRN
  Administered 2017-12-24: 10 mL

## 2017-12-24 MED ORDER — ASPIRIN EC 81 MG PO TBEC
81.0000 mg | DELAYED_RELEASE_TABLET | Freq: Every day | ORAL | Status: DC
  Administered 2017-12-25: 81 mg via ORAL
  Filled 2017-12-24: qty 1

## 2017-12-24 NOTE — H&P (Signed)
Date: 12/24/2017               Patient Name:  Robert [name] MRN: [id number]  DOB: 1934-05-21 Age / Sex: 84 y.o., male   PCP: Markus Jarvis, MD         Medical Service: Internal Medicine Teaching Service         Attending Physician: Dr. Oval Linsey, MD    First Contact: Dr. Laural Golden Pager: [phone]  Second Contact: Dr. Danford Bad Pager: [phone]       After Hours (After 5p/  First Contact Pager: [phone]  weekends / holidays): Second Contact Pager: [phone]   Chief Complaint: Syncope  History of Present Illness:   Robert [name] is a very-pleasant 84 year-old male with medical history notable for Multiple Myeloma with recent relapse, HTN and HLD who presents today following a syncopal episode. Robert [name] was in his usual state of health until after returning home today from outpatient CT-guided bone marrow biopsy. This was ordered by oncologist for evaluation of anemia and concern for myelodysplasia. Patient reports he was instructed not to eat, drink or take medications 12-hrs prior to the procedure however fasted for approximately 16-hrs. He reports the procedure was uncomplicated and he denied pain or bleeding. When returning home, he took all of his medications and quickly ate a chicken-sandwich, salad and started eating a slice of apple-pie when he started to feel weak, diaphoretic, shakey and ultimately synopsized. Wife reports he regained consciousness after approximately 2-3 minutes but patient was 'bracing' himself at the kitchen table and did not fall nor hit head. Upon awakening his mental status was at baseline and he had no apparent neurologic deficit.   Patient and his wife note a strikingly-similar presentation about 14 years ago around the time of a surgical procedure. They report that surgery went well however when breaking his fast after the procedure, he developed weakness, shakiness and had a brief syncopal episode. He awoke in his usual state of health and denied any  other prior history of similar episodes.   In the emergency department, temperature 98*F, BP 178/90, pulse 63, respirations 12 and he was saturating 100% on RA. Orthostatic vital signs were negative. CMET wnl. Glucose 130. CBC without leukocytosis but did show stable chronic macrocytic anemia with Hb 10.6.  2-view CXR was without acute cardiopulmonary disease and CT head without contrast was also without acute abnormality. EKG with normal sinus rhythm without TWI or ST segment changes. ED contacted IMTS for admission for observation of syncope.   Meds:  Current Meds  Medication Sig  . albuterol (PROAIR HFA) 108 (90 Base) MCG/ACT inhaler Inhale 2 puffs into the lungs every 6 (six) hours as needed for wheezing or shortness of breath.   Marland Kitchen aspirin EC 81 MG tablet Take 81 mg by mouth daily.  . bimatoprost (LUMIGAN) 0.01 % SOLN Place 1 drop into both eyes at bedtime.   . Calcium Carbonate-Vit D-Min (CALTRATE 600+D PLUS PO) Take 1 tablet by mouth daily.  . carvedilol (COREG) 25 MG tablet Take 25 mg by mouth 2 (two) times daily with a meal.  . Cholecalciferol (VITAMIN D3) 1000 UNITS CAPS Take 1,000 Units by mouth daily.   . cyanocobalamin (CVS VITAMIN B12) 2000 MCG tablet Take 2,000 mcg by mouth at bedtime.   . famciclovir (FAMVIR) 250 MG tablet TAKE 1 TABLET DAILY  . fluticasone (FLONASE) 50 MCG/ACT nasal spray Place 2 sprays into both nostrils 2 (two) times daily as needed (seasonal  allergies).   . hydrochlorothiazide (HYDRODIURIL) 25 MG tablet Take 25 mg by mouth daily.  Marland Kitchen losartan (COZAAR) 100 MG tablet Take 100 mg by mouth daily.  . Multiple Vitamin (MULITIVITAMIN WITH MINERALS) TABS Take 1 tablet by mouth daily.  Vladimir Faster Glycol-Propyl Glycol (SYSTANE OP) Place 1 drop into both eyes daily as needed (dry eyes).   . potassium chloride SA (K-DUR,KLOR-CON) 20 MEQ tablet Take 20 mEq by mouth daily.  . pravastatin (PRAVACHOL) 20 MG tablet Take 20 mg by mouth at bedtime.  . tadalafil (ADCIRCA/CIALIS)  20 MG tablet Take 20 mg by mouth daily as needed for erectile dysfunction.  . Zoledronic Acid (ZOMETA) 4 MG/100ML IVPB Inject 4 mg into the vein every 4 (four) months. Administered by Dr. Jonette Eva at [facility] - last infusion mid July 2019   Allergies: Allergies as of 12/24/2017  . (No Known Allergies)   Past Medical History:  Diagnosis Date  . Arthritis   . Hyperlipidemia   . Hypertension   . Multiple myeloma ([place])    2006   Family History: Mother with history of head and neck malignancy. Father with history of heart disease. Brother with diabetes. Otherwise non-contributory.   Social History: Former smoker with 36 pack-year history. Quit in 1990. Rarely consumes alcohol and denied any recreational drug use. He is married to his wife Robert [name] of 40+ years; they are 'retired' but own/lease a Chemical engineer.   Review of Systems: A complete ROS was negative except as per HPI.   Physical Exam: Blood pressure (!) 169/81, pulse 64, resp. rate 17, height _0  (1.803 m), weight 212 lb (96.2 kg), SpO2 95 %. General: Alert AA male. Well-developed and well-nourished. In no acute distress. Resting comfortably in bed. Wife present at bedside.  HEENT: PERRL. EOMI. No conjunctival injection, icterus or ptosis. Oropharynx clear, mucous membranes moist. No thrush.   Cardiovascular: Regular rate and rhythm without murmur or rub appreciated. Pulmonary: CTA BL, no wheezing, crackles or rhonchi appreciated. Unlabored breathing. Saturating well on RA. Abdomen: Obese abdomen. Soft, non-tender and non-distended. No guarding or rigidity. +bowel sounds.  Extremities: No peripheral edema noted BL. Intact distal pulses. No gross deformities. Skin: Warm, dry. No cyanosis. Low back with clean, dry bandages overlying bone marrow biopsy site. No erythema or tenderness.   Neuro: Strength and sensation intact in BL UE and LE. Facial muscles symmetric and reflexes were intact and symmetric BL.  Psych:  Mood normal and affect was mood congruent. Responds to questions appropriately.   EKG: personally reviewed my interpretation is sinus rhythm without TWI or ST segment changes.   CXR: personally reviewed my interpretation is no active cardiopulmonary disease.   Assessment & Plan by Problem:  Active Problems:   Syncope  Postprandial Syncope Patient with brief syncopal episode with prodrome of weakness, diaphoresis and "shakiness" which started about 15-20 minutes after taking his daily medications and breaking a fast. Work-up is reassuring and his examination is benign without evidence for acute neurologic event. He quickly returned to baseline following the event and does report a similar episode many years ago also after breaking a fast and having a procedure. Differential includes CVA (CT normal, no focal neurologic deficit and symptoms not consistent with posterior circulation stroke to warrant MRI), dehydration vs orthostatic hypotension (orthostatic vital signs negative, renal function nml), cardiogenic (EKG/Troponin normal, no prior cardiac history, no CP/SOB), vasovagal syncope (did have bone marrow biopsy today but denies pain), hyperthyroidism (checking TSH), medication side effect or  relative hypotension related to peri-prandial shifts in blood flow. Given his history of similar episodes after breaking a fast, I suspect he had a degree of postprandial relative hypotension. There could also be  Patient looked well and felt ready for discharge home in the ED however will observe the patient overnight on telemetry and ensure patient able to tolerate PO intake without issue.  -Telemetry -Trend troponins -EKG in AM -ECHO -CBC in AM to ensure stability -TSH, A1c  HTN Patient has remained hypertensive since presentation with SBP ~170-180's. He denies any headache, vision changes or neurologic deficits. He did not take his daily medications as usual for the past day however did resume them this  afternoon after his procedure. I suspect his blood pressure will continue to improve throughout the night as these medications have opportunity to resume working. Could consider low-dose IV hydral if needed, but suspect this will improve on its own.   Multiple Myeloma He follows regularly with Dr. Elnoria Howard and had bone marrow biopsy today to evaluate for persistent anemia. He is on once monthly infusions currently. No changes to be made to his management during this hospitalization and has follow-up already scheduled with his oncologist to discuss the biopsy results when they return.   Code status: FULL Diet: HH IVF: None; received 1L in ED DVT Ppx: Holding in setting of bone marrow biopsy today; SCDs ordered  Dispo: Admit patient to Observation with expected length of stay less than 2 midnights.  SignedEinar Gip, DO 12/24/2017, 6:34 PM  Pager: [phone]

## 2017-12-24 NOTE — H&P (Signed)
Referring Physician(s): Ennever,Peter R  Supervising Physician: Corrie Mckusick  Patient Status:  [facility] OP  Chief Complaint:  "I'm having a bone marrow biopsy"  Subjective: Patient familiar to IR service from prior Port-A-Cath placement in 2006.  He has a history of multiple myeloma with prior stem cell transplant in 2006.  He now presents with worsening anemia and is scheduled today for CT-guided bone marrow biopsy to rule out MDS.  He denies fever, headache, chest pain, dyspnea, cough, abdominal pain, nausea, vomiting or bleeding.  He does have some intermittent mild left flank discomfort.  Past Medical History:  Diagnosis Date  . Arthritis   . Hyperlipidemia   . Hypertension   . Multiple myeloma ([place])    2006   Past Surgical History:  Procedure Laterality Date  . CATARACT EXTRACTION BILATERAL W/ ANTERIOR VITRECTOMY    . LIMBAL STEM CELL TRANSPLANT       Allergies: Patient has no known allergies.  Medications: Prior to Admission medications   Medication Sig Start Date End Date Taking? Authorizing Provider  acetaminophen (TYLENOL) 500 MG tablet Take 1,000 mg by mouth every 6 (six) hours as needed for headache.   Yes [provider]  albuterol (PROAIR HFA) 108 (90 Base) MCG/ACT inhaler Inhale into the lungs. 08/20/15  Yes [provider]  aspirin EC 81 MG tablet Take 81 mg by mouth daily.   Yes [provider]  bimatoprost (LUMIGAN) 0.03 % ophthalmic solution Place 1 drop into both eyes at bedtime. 12/07/11  Yes [provider]  Calcium Carbonate-Vit D-Min (CALTRATE 600+D PLUS PO) Take 1 tablet by mouth daily.   Yes [provider]  carvedilol (COREG) 25 MG tablet Take 25 mg by mouth 2 (two) times daily with a meal.   Yes [provider]  Cholecalciferol (VITAMIN D3) 1000 UNITS CAPS Take 1 capsule by mouth daily.   Yes [provider]  cloNIDine (CATAPRES) 0.1 MG tablet Take by mouth 2 (two) times daily.  01/03/16   Yes [provider]  cyanocobalamin (CVS VITAMIN B12) 2000 MCG tablet Take by mouth.   Yes [provider]  famciclovir (FAMVIR) 250 MG tablet TAKE 1 TABLET DAILY 06/02/17  Yes Ennever, Rudell Cobb, MD  fluticasone (FLONASE) 50 MCG/ACT nasal spray Place 2 sprays into both nostrils 2 (two) times daily as needed for allergies. Place 2 sprays into both nostrils daily.   Yes [provider]  hydrochlorothiazide (HYDRODIURIL) 25 MG tablet Take 25 mg by mouth daily.   Yes [provider]  losartan (COZAAR) 100 MG tablet Take 100 mg by mouth daily.   Yes [provider]  Multiple Vitamin (MULITIVITAMIN WITH MINERALS) TABS Take 1 tablet by mouth daily.   Yes [provider]  Polyethyl Glycol-Propyl Glycol (SYSTANE OP) Apply 1 drop to eye daily as needed (dry eyes).   Yes [provider]  potassium chloride (KLOR-CON) 20 MEQ packet Take 20 mEq by mouth daily.   Yes [provider]  pravastatin (PRAVACHOL) 20 MG tablet Take 20 mg by mouth at bedtime.   Yes [provider]  tadalafil (CIALIS) 20 MG tablet TAKE 1 TABLET ONCE DAILY AS NEEDED FOR ERECTILE DYSFUNCTION. DO NOT TAKE MORE THAN ONE DOSE IN 24 HOURS. DO NOT TAKE WITH NITRATES 08/14/13  Yes [provider]  valsartan (DIOVAN) 320 MG tablet TAKE 1 TABLET DAILY 01/01/16  Yes [provider]  Zoledronic Acid (ZOMETA) 4 MG/100ML IVPB Inject 4 mg into the vein. Every 4  months.    [provider]     Vital Signs: BP (!) 198/96 (BP Location: Left Arm)   Pulse 62   Temp 97.6 F (36.4 C) (Oral)   Resp 16   SpO2 100%   Physical Exam awake, alert.  Chest clear to auscultation bilaterally.  Heart with regular rate and rhythm.  Abdomen soft, positive bowel sounds, nontender.  Extremities with  full range of motion, not significantly edematous.  Imaging: No results found.  Labs:  CBC: Recent Labs    04/08/17 1141 08/05/17 1147 12/09/17 1159  WBC 4.3  3.4* 3.5*  HGB 11.0* 10.7* 9.7*  HCT 32.3* 32.0* 28.7*  PLT 178 148 163    COAGS: No results for input(s): INR, APTT in the last 8760 hours.  BMP: Recent Labs    04/08/17 1141 08/05/17 1147 12/09/17 1159  NA 137 140 134  K 3.4 3.5 3.8  CL 102 105 102  CO2 _0 GLUCOSE 100 110 101  BUN _1 CALCIUM 9.3 9.5 9.4  CREATININE 1.3* 1.10 1.20    LIVER FUNCTION TESTS: Recent Labs    04/08/17 1141 08/05/17 1147 12/09/17 1159  BILITOT 1.00 1.2 1.1  AST _2 ALT _3 ALKPHOS 66 75 57  PROT 7.7  7.9 8.0 7.5  ALBUMIN 3.7 3.7 3.5    Assessment and Plan:  Pt with history of multiple myeloma with prior stem cell transplant in 2006.  He now presents with worsening anemia and is scheduled today for CT-guided bone marrow biopsy to rule out MDS.Risks and benefits discussed with the patient/spouse including, but not limited to bleeding, infection, damage to adjacent structures or low yield requiring additional tests.  All of the patient's questions were answered, patient is agreeable to proceed. Consent signed and in chart.      Electronically Signed: D. Rowe Robert, PA-C 12/24/2017, 9:07 AM   I spent a total of 20 minutes at the the patient's bedside AND on the patient's hospital floor or unit, greater than 50% of which was counseling/coordinating care for CT-guided bone marrow biopsy

## 2017-12-24 NOTE — Discharge Instructions (Signed)
Moderate Conscious Sedation, Adult, Care After °These instructions provide you with information about caring for yourself after your procedure. Your health care provider may also give you more specific instructions. Your treatment has been planned according to current medical practices, but problems sometimes occur. Call your health care provider if you have any problems or questions after your procedure. °What can I expect after the procedure? °After your procedure, it is common: °· To feel sleepy for several hours. °· To feel clumsy and have poor balance for several hours. °· To have poor judgment for several hours. °· To vomit if you eat too soon. ° °Follow these instructions at home: °For at least 24 hours after the procedure: ° °· Do not: °? Participate in activities where you could fall or become injured. °? Drive. °? Use heavy machinery. °? Drink alcohol. °? Take sleeping pills or medicines that cause drowsiness. °? Make important decisions or sign legal documents. °? Take care of children on your own. °· Rest. °Eating and drinking °· Follow the diet recommended by your health care provider. °· If you vomit: °? Drink water, juice, or soup when you can drink without vomiting. °? Make sure you have little or no nausea before eating solid foods. °General instructions °· Have a responsible adult stay with you until you are awake and alert. °· Take over-the-counter and prescription medicines only as told by your health care provider. °· If you smoke, do not smoke without supervision. °· Keep all follow-up visits as told by your health care provider. This is important. °Contact a health care provider if: °· You keep feeling nauseous or you keep vomiting. °· You feel light-headed. °· You develop a rash. °· You have a fever. °Get help right away if: °· You have trouble breathing. °This information is not intended to replace advice given to you by your health care provider. Make sure you discuss any questions you have  with your health care provider. °Document Released: 03/01/2013 Document Revised: 10/14/2015 Document Reviewed: 08/31/2015 °Elsevier Interactive Patient Education © 2018 [employer] °Bone Marrow Aspiration and Bone Marrow Biopsy, Adult, Care After °This sheet gives you information about how to care for yourself after your procedure. Your health care provider may also give you more specific instructions. If you have problems or questions, contact your health care provider. °What can I expect after the procedure? °After the procedure, it is common to have: °· Mild pain and tenderness. °· Swelling. °· Bruising. ° °Follow these instructions at home: °· Take over-the-counter or prescription medicines only as told by your health care provider. °· Do not take baths, swim, or use a hot tub until your health care provider approves. Ask if you can take a shower or have a sponge bath. °· Follow instructions from your health care provider about how to take care of the puncture site. Make sure you: °? Wash your hands with soap and water before you change your bandage (dressing). If soap and water are not available, use hand sanitizer. °? Change your dressing as told by your health care provider. °· Check your puncture site every day for signs of infection. Check for: °? More redness, swelling, or pain. °? More fluid or blood. °? Warmth. °? Pus or a bad smell. °· Return to your normal activities as told by your health care provider. Ask your health care provider what activities are safe for you. °· Do not drive for 24 hours if you were given a medicine to help you relax (sedative). °·   Keep all follow-up visits as told by your health care provider. This is important. °Contact a health care provider if: °· You have more redness, swelling, or pain around the puncture site. °· You have more fluid or blood coming from the puncture site. °· Your puncture site feels warm to the touch. °· You have pus or a bad smell coming from the  puncture site. °· You have a fever. °· Your pain is not controlled with medicine. °This information is not intended to replace advice given to you by your health care provider. Make sure you discuss any questions you have with your health care provider. °Document Released: 11/28/2004 Document Revised: 11/29/2015 Document Reviewed: 10/23/2015 °Elsevier Interactive Patient Education © 2018 [employer] ° °

## 2017-12-24 NOTE — ED Provider Notes (Signed)
[facility] DEPARTMENT Provider Note   CSN: [id number] Arrival date & time: 12/24/17  1510     History   Chief Complaint Chief Complaint  Patient presents with  . Loss of Consciousness    HPI Robert [name] is a 84 y.o. male.  HPI 84 year old male with past medical history of multiple myeloma status post stem cell transplant here with syncopal episode.  The patient states that earlier this morning, he had a lower lumbar bone biopsy.  He states that he was told not to eat or drink and went since 11:00 last night without eating or drinking.  Just prior to the bone marrow transplant, he took all of his medications.  Upon returning home, he picked up food and was sitting down to eat.  He began eating and was able to finish a sandwich quickly, but then began feeling very shaky and weak.  He states he then lost consciousness.  He lost consciousness for less than 2 to 3 minutes and regained consciousness upon lying flat.  He states he continues to feel "shaky" but otherwise is without complaints.  He does note this is happened previously after he was told not to eat or drink after receiving a dental procedure.  He denies any recent changes in his medications.  Denies any pain at his lumbar biopsy site.  Symptoms do seem to be slightly worse upon standing up.  No alleviating factors.  He states he otherwise feels fine.  Past Medical History:  Diagnosis Date  . Arthritis   . Hyperlipidemia   . Hypertension   . Multiple myeloma ([place])    2006    Patient Active Problem List   Diagnosis Date Noted  . Multiple myeloma in relapse ([place]) 07/30/2016  . Lytic bone lesions on xray 03/17/2016  . Myeloma ([place]) 07/02/2011    Past Surgical History:  Procedure Laterality Date  . CATARACT EXTRACTION BILATERAL W/ ANTERIOR VITRECTOMY    . LIMBAL STEM CELL TRANSPLANT          Home Medications    Prior to Admission medications   Medication Sig Start Date End Date Taking?  Authorizing Provider  acetaminophen (TYLENOL) 500 MG tablet Take 1,000 mg by mouth every 6 (six) hours as needed for headache.    [provider]  albuterol (PROAIR HFA) 108 (90 Base) MCG/ACT inhaler Inhale into the lungs. 08/20/15   [provider]  aspirin EC 81 MG tablet Take 81 mg by mouth daily.    [provider]  bimatoprost (LUMIGAN) 0.03 % ophthalmic solution Place 1 drop into both eyes at bedtime. 12/07/11   [provider]  Calcium Carbonate-Vit D-Min (CALTRATE 600+D PLUS PO) Take 1 tablet by mouth daily.    [provider]  carvedilol (COREG) 25 MG tablet Take 25 mg by mouth 2 (two) times daily with a meal.    [provider]  Cholecalciferol (VITAMIN D3) 1000 UNITS CAPS Take 1 capsule by mouth daily.    [provider]  cloNIDine (CATAPRES) 0.1 MG tablet Take by mouth 2 (two) times daily.  01/03/16   [provider]  cyanocobalamin ([employer] VITAMIN B12) 2000 MCG tablet Take by mouth.    [provider]  famciclovir (FAMVIR) 250 MG tablet TAKE 1 TABLET DAILY 06/02/17   Volanda Napoleon, MD  fluticasone (FLONASE) 50 MCG/ACT nasal spray Place 2 sprays into both nostrils 2 (two) times daily as needed for allergies. Place 2 sprays into both nostrils daily.  [provider]  hydrochlorothiazide (HYDRODIURIL) 25 MG tablet Take 25 mg by mouth daily.    [provider]  losartan (COZAAR) 100 MG tablet Take 100 mg by mouth daily.    [provider]  Multiple Vitamin (MULITIVITAMIN WITH MINERALS) TABS Take 1 tablet by mouth daily.    [provider]  Polyethyl Glycol-Propyl Glycol (SYSTANE OP) Apply 1 drop to eye daily as needed (dry eyes).    [provider]  potassium chloride (KLOR-CON) 20 MEQ packet Take 20 mEq by mouth daily.    [provider]  pravastatin (PRAVACHOL) 20 MG tablet Take 20 mg by mouth at bedtime.    [provider]  tadalafil (CIALIS) 20 MG  tablet TAKE 1 TABLET ONCE DAILY AS NEEDED FOR ERECTILE DYSFUNCTION. DO NOT TAKE MORE THAN ONE DOSE IN 24 HOURS. DO NOT TAKE WITH NITRATES 08/14/13   [provider]  valsartan (DIOVAN) 320 MG tablet TAKE 1 TABLET DAILY 01/01/16   [provider]  Zoledronic Acid (ZOMETA) 4 MG/100ML IVPB Inject 4 mg into the vein. Every 4 months.    [provider]    Family History Family History  Problem Relation Age of Onset  . Heart attack Father   . Heart disease Father   . Throat cancer Mother   . Diabetes Brother   . Colon cancer Neg Hx     Social History Social History   Tobacco Use  . Smoking status: Former Smoker    Packs/day: 4.00    Years: 9.00    Pack years: 36.00    Types: Cigars, Cigarettes    Start date: 07/08/1979    Last attempt to quit: 07/07/1988    Years since quitting: 29.4  . Smokeless tobacco: Never Used  . Tobacco comment: quit 24 years ago  Substance Use Topics  . Alcohol use: Yes    Alcohol/week: 3.6 oz    Types: 6 Glasses of wine per week    Comment: very seldom  . Drug use: No     Allergies   Patient has no known allergies.   Review of Systems Review of Systems  Constitutional: Positive for fatigue. Negative for chills and fever.  HENT: Negative for congestion and rhinorrhea.   Eyes: Negative for visual disturbance.  Respiratory: Negative for cough, shortness of breath and wheezing.   Cardiovascular: Negative for chest pain and leg swelling.  Gastrointestinal: Negative for abdominal pain, diarrhea, nausea and vomiting.  Genitourinary: Negative for dysuria and flank pain.  Musculoskeletal: Negative for neck pain and neck stiffness.  Skin: Negative for rash and wound.  Allergic/Immunologic: Negative for immunocompromised state.  Neurological: Positive for syncope and weakness. Negative for headaches.  All other systems reviewed and are negative.    Physical Exam Updated Vital Signs BP (!) 176/86   Pulse 62   Resp 13   Ht  5' 11" (1.803 m)   Wt 96.2 kg (212 lb)   SpO2 100%   BMI 29.57 kg/m   Physical Exam  Constitutional: He is oriented to person, place, and time. He appears well-developed and well-nourished. No distress.  HENT:  Head: Normocephalic and atraumatic.  Dry MM  Eyes: Conjunctivae are normal.  Neck: Normal range of motion. Neck supple.  Cardiovascular: Normal rate, regular rhythm and normal heart sounds. Exam reveals no friction rub.  No murmur heard. Pulmonary/Chest: Effort normal and breath sounds normal. No respiratory distress. He has no wheezes. He has no rales.  Abdominal: Soft. Bowel sounds are normal.  He exhibits no distension. There is no tenderness. There is no rebound and no guarding.  Musculoskeletal: He exhibits no edema.  Lower lumbar biopsy site c/d/i, no bleeding, no hematoma  Neurological: He is alert and oriented to person, place, and time. He exhibits normal muscle tone.  Skin: Skin is warm. Capillary refill takes less than 2 seconds.  Psychiatric: He has a normal mood and affect.  Nursing note and vitals reviewed.   Neurological Exam:  Mental Status: Alert and oriented to person, place, and time. Attention and concentration normal. Speech clear. Recent memory is intact. Cranial Nerves: Visual fields grossly intact. EOMI and PERRLA. No nystagmus noted. Facial sensation intact at forehead, maxillary cheek, and chin/mandible bilaterally. No facial asymmetry or weakness. Hearing grossly normal. Uvula is midline, and palate elevates symmetrically. Normal SCM and trapezius strength. Tongue midline without fasciculations. Motor: Muscle strength 5/5 in proximal and distal UE and LE bilaterally. No pronator drift. Muscle tone normal. Reflexes: 2+ and symmetrical in all four extremities.  Sensation: Intact to light touch in upper and lower extremities distally bilaterally.  Gait: Normal without ataxia. Coordination: Normal FTN bilaterally.     ED Treatments / Results   Labs (all labs ordered are listed, but only abnormal results are displayed) Labs Reviewed  CBC WITH DIFFERENTIAL/PLATELET - Abnormal; Notable for the following components:      Result Value   RBC 3.09 (*)    Hemoglobin 10.6 (*)    HCT 32.2 (*)    MCV 104.2 (*)    MCH 34.3 (*)    All other components within normal limits  COMPREHENSIVE METABOLIC PANEL - Abnormal; Notable for the following components:   Glucose, Bld 130 (*)    GFR calc non Af Amer 57 (*)    All other components within normal limits  TROPONIN I  MAGNESIUM  CBG MONITORING, ED    EKG EKG Interpretation  Date/Time:  Friday December 24 2017 15:10:49 EDT Ventricular Rate:  65 PR Interval:    QRS Duration: 87 QT Interval:  429 QTC Calculation: 447 R Axis:   38 Text Interpretation:  Sinus rhythm No significant change since last tracing Confirmed by Duffy Bruce [phone]) on 12/24/2017 3:31:05 PM   Radiology Dg Chest 2 View  Result Date: 12/24/2017 CLINICAL DATA:  Syncopal episode at the dinner table, history hypertension, multiple myeloma EXAM: CHEST - 2 VIEW COMPARISON:  04/14/2012 FINDINGS: Upper normal size of cardiac silhouette. Mediastinal contours and pulmonary vascularity normal. Lungs clear. No pulmonary infiltrate, pleural effusion or pneumothorax. No focal osseous abnormalities identified. IMPRESSION: No acute abnormalities. Electronically Signed   By: Lavonia Dana M.D.   On: 12/24/2017 16:34   Ct Head Wo Contrast  Result Date: 12/24/2017 CLINICAL DATA:  84 year old who underwent a bone marrow biopsy earlier today, presenting after having a syncopal episode while sitting at the dinner table approximately 2 hours after the biopsy. Patient did not fall and did not lose consciousness. EXAM: CT HEAD WITHOUT CONTRAST TECHNIQUE: Contiguous axial images were obtained from the base of the skull through the vertex without intravenous contrast. COMPARISON:  None. FINDINGS: Brain: Mild age-appropriate cortical atrophy and  deep atrophy. Mild to moderate changes of small vessel disease of the white matter diffusely. No mass lesion. No midline shift. No acute hemorrhage or hematoma. No extra-axial fluid collections. No evidence of acute infarction. Physiologic calcifications in the basal ganglia. Calcification along the falx and the tentorium. Vascular: Severe BILATERAL carotid siphon and LEFT vertebral artery atherosclerosis. Atretic RIGHT vertebral artery.  No hyperdense vessel. Skull: No skull fracture or other focal osseous abnormality involving the skull. Sinuses/Orbits: Visualized paranasal sinuses, bilateral mastoid air cells and bilateral middle ear cavities well-aerated. Visualized orbits and globes normal in appearance. Other: None. IMPRESSION: 1. No acute intracranial abnormality. 2. Mild age-appropriate generalized atrophy and mild to moderate chronic microvascular ischemic changes of the white matter. Electronically Signed   By: Evangeline Dakin M.D.   On: 12/24/2017 16:52   Ct Biopsy  Result Date: 12/24/2017 INDICATION: 84 year old male with a history of anemia, and multiple myeloma, status post bone marrow transplant EXAM: CT BONE MARROW BIOPSY AND ASPIRATION; CT BIOPSY MEDICATIONS: None. ANESTHESIA/SEDATION: Moderate (conscious) sedation was employed during this procedure. A total of Versed 2.0 mg and Fentanyl 100 mcg was administered intravenously. Moderate Sedation Time: 14 minutes. The patient's level of consciousness and vital signs were monitored continuously by radiology nursing throughout the procedure under my direct supervision. FLUOROSCOPY TIME:  None COMPLICATIONS: None PROCEDURE: The procedure risks, benefits, and alternatives were explained to the patient. Questions regarding the procedure were encouraged and answered. The patient understands and consents to the procedure. Scout CT of the pelvis was performed for surgical planning purposes. The posterior pelvis was prepped with chlorhexidinein a sterile  fashion, and a sterile drape was applied covering the operative field. A sterile gown and sterile gloves were used for the procedure. Local anesthesia was provided with 1% Lidocaine. We targeted the left posterior iliac bone for biopsy. The skin and subcutaneous tissues were infiltrated with 1% lidocaine without epinephrine. A small stab incision was made with an 11 blade scalpel, and an 11 gauge Murphy needle was advanced with CT guidance to the posterior cortex. Manual forced was used to advance the needle through the posterior cortex and the stylet was removed. A bone marrow aspirate was retrieved and passed to a cytotechnologist in the room. The Murphy needle was then advanced without the stylet for a core biopsy. The core biopsy was retrieved and also passed to a cytotechnologist. A second core biopsy was performed. Manual pressure was used for hemostasis and a sterile dressing was placed. No complications were encountered no significant blood loss was encountered. Patient tolerated the procedure well and remained hemodynamically stable throughout. IMPRESSION: Status post CT-guided bone marrow biopsy, with tissue specimen sent to pathology for complete histopathologic analysis Signed, Dulcy Fanny. Earleen Newport, DO [facility] Radiology Electronically Signed   By: Corrie Mckusick D.O.   On: 12/24/2017 11:12   Ct Bone Marrow Biopsy & Aspiration  Result Date: 12/24/2017 INDICATION: 84 year old male with a history of anemia, and multiple myeloma, status post bone marrow transplant EXAM: CT BONE MARROW BIOPSY AND ASPIRATION; CT BIOPSY MEDICATIONS: None. ANESTHESIA/SEDATION: Moderate (conscious) sedation was employed during this procedure. A total of Versed 2.0 mg and Fentanyl 100 mcg was administered intravenously. Moderate Sedation Time: 14 minutes. The patient's level of consciousness and vital signs were monitored continuously by radiology nursing throughout the procedure  under my direct supervision. FLUOROSCOPY TIME:  None COMPLICATIONS: None PROCEDURE: The procedure risks, benefits, and alternatives were explained to the patient. Questions regarding the procedure were encouraged and answered. The patient understands and consents to the procedure. Scout CT of the pelvis was performed for surgical planning purposes. The posterior pelvis was prepped with chlorhexidinein a sterile fashion, and a sterile drape was applied covering the operative field. A sterile gown and sterile gloves were used for the procedure. Local anesthesia was provided with 1% Lidocaine. We targeted the left  posterior iliac bone for biopsy. The skin and subcutaneous tissues were infiltrated with 1% lidocaine without epinephrine. A small stab incision was made with an 11 blade scalpel, and an 11 gauge Murphy needle was advanced with CT guidance to the posterior cortex. Manual forced was used to advance the needle through the posterior cortex and the stylet was removed. A bone marrow aspirate was retrieved and passed to a cytotechnologist in the room. The Murphy needle was then advanced without the stylet for a core biopsy. The core biopsy was retrieved and also passed to a cytotechnologist. A second core biopsy was performed. Manual pressure was used for hemostasis and a sterile dressing was placed. No complications were encountered no significant blood loss was encountered. Patient tolerated the procedure well and remained hemodynamically stable throughout. IMPRESSION: Status post CT-guided bone marrow biopsy, with tissue specimen sent to pathology for complete histopathologic analysis Signed, Dulcy Fanny. Earleen Newport, DO [facility] Radiology Electronically Signed   By: Corrie Mckusick D.O.   On: 12/24/2017 11:12    Procedures Procedures (including critical care time)  Medications Ordered in ED Medications  sodium chloride 0.9 % bolus 1,000 mL (0 mLs Intravenous  Stopped 12/24/17 1655)     Initial Impression / Assessment and Plan / ED Course  I have reviewed the triage vital signs and the nursing notes.  Pertinent labs & imaging results that were available during my care of the patient were reviewed by me and considered in my medical decision making (see chart for details).  Clinical Course as of Dec 25 1719  Fri Dec 24, 1145  [id number] 84 year old male here with syncopal episode after biopsy of iliac crest done earlier today.  Patient is afebrile, hemodynamically stable.  No focal neurological deficits.  Heart regular rate and rhythm without significant murmur.  Suspect this could have been related to poor p.o. intake, dehydration, and taking all of his medications.  He has a history of similar episodes.  Check labs, particularly hemoglobin given biopsy, and reassess.   [CI]  1558 Orthostatics wnl   [CI]  1720 Labs, imaging unremarkable.  Electrolytes acceptable.  Hemoglobin is near his baseline.  He is not orthostatic.  He is hypertensive which he states is above his baseline.  No focal neuro deficits.  Given unprovoked syncope without significant prodrome and no clear etiology, will admit for observation   [CI]    Clinical Course User Index [CI] Duffy Bruce, MD     Final Clinical Impressions(s) / ED Diagnoses   Final diagnoses:  Syncope, unspecified syncope type    ED Discharge Orders    None       Duffy Bruce, MD 12/24/17 1721

## 2017-12-24 NOTE — ED Triage Notes (Signed)
Per [facility], pt from home after having a syncopal episode while sitting at the dinner table. DId not fall or hit head. Recalls incident. Syncopal episode happened 2 hours after having a bone marrow aspiration at [facility] this morning(due to multiple myeloma treatment). Pt states that he was given different sedation medication during procedure than he normally gets. No neuro deficits. VSS. Axox4.

## 2017-12-24 NOTE — ED Notes (Signed)
Pt given urinal to use.

## 2017-12-24 NOTE — Procedures (Signed)
Interventional Radiology Procedure Note  Procedure: CT guided aspirate and core biopsy of left posterior iliac bone Complications: None Recommendations: - Bedrest supine x 1 hrs - OTC's PRN  Pain - Follow biopsy results  Signed,  Aldyn Toon S. Etan Vasudevan, DO    

## 2017-12-25 ENCOUNTER — Other Ambulatory Visit (HOSPITAL_COMMUNITY): Payer: Medicare Other

## 2017-12-25 ENCOUNTER — Observation Stay (HOSPITAL_BASED_OUTPATIENT_CLINIC_OR_DEPARTMENT_OTHER): Payer: Medicare Other

## 2017-12-25 DIAGNOSIS — Z9481 Bone marrow transplant status: Secondary | ICD-10-CM | POA: Diagnosis not present

## 2017-12-25 DIAGNOSIS — Z7982 Long term (current) use of aspirin: Secondary | ICD-10-CM | POA: Diagnosis not present

## 2017-12-25 DIAGNOSIS — Z79899 Other long term (current) drug therapy: Secondary | ICD-10-CM | POA: Diagnosis not present

## 2017-12-25 DIAGNOSIS — I739 Peripheral vascular disease, unspecified: Secondary | ICD-10-CM | POA: Diagnosis not present

## 2017-12-25 DIAGNOSIS — E785 Hyperlipidemia, unspecified: Secondary | ICD-10-CM | POA: Diagnosis not present

## 2017-12-25 DIAGNOSIS — Z9484 Stem cells transplant status: Secondary | ICD-10-CM | POA: Diagnosis not present

## 2017-12-25 DIAGNOSIS — Z8579 Personal history of other malignant neoplasms of lymphoid, hematopoietic and related tissues: Secondary | ICD-10-CM | POA: Diagnosis not present

## 2017-12-25 DIAGNOSIS — I10 Essential (primary) hypertension: Secondary | ICD-10-CM | POA: Diagnosis not present

## 2017-12-25 DIAGNOSIS — R7309 Other abnormal glucose: Secondary | ICD-10-CM | POA: Diagnosis not present

## 2017-12-25 DIAGNOSIS — I351 Nonrheumatic aortic (valve) insufficiency: Secondary | ICD-10-CM | POA: Diagnosis not present

## 2017-12-25 DIAGNOSIS — C9002 Multiple myeloma in relapse: Secondary | ICD-10-CM | POA: Diagnosis not present

## 2017-12-25 DIAGNOSIS — Z87891 Personal history of nicotine dependence: Secondary | ICD-10-CM | POA: Diagnosis not present

## 2017-12-25 DIAGNOSIS — R55 Syncope and collapse: Secondary | ICD-10-CM

## 2017-12-25 DIAGNOSIS — I672 Cerebral atherosclerosis: Secondary | ICD-10-CM | POA: Diagnosis not present

## 2017-12-25 DIAGNOSIS — D539 Nutritional anemia, unspecified: Secondary | ICD-10-CM | POA: Diagnosis not present

## 2017-12-25 LAB — CBC
HCT: 28 % — ABNORMAL LOW (ref 39.0–52.0)
Hemoglobin: 9.2 g/dL — ABNORMAL LOW (ref 13.0–17.0)
MCH: 33.7 pg (ref 26.0–34.0)
MCHC: 32.9 g/dL (ref 30.0–36.0)
MCV: 102.6 fL — ABNORMAL HIGH (ref 78.0–100.0)
Platelets: 144 10*3/uL — ABNORMAL LOW (ref 150–400)
RBC: 2.73 MIL/uL — ABNORMAL LOW (ref 4.22–5.81)
RDW: 13.8 % (ref 11.5–15.5)
WBC: 4.1 10*3/uL (ref 4.0–10.5)

## 2017-12-25 LAB — BASIC METABOLIC PANEL
Anion gap: 8 (ref 5–15)
BUN: 12 mg/dL (ref 8–23)
CO2: 26 mmol/L (ref 22–32)
Calcium: 9.1 mg/dL (ref 8.9–10.3)
Chloride: 105 mmol/L (ref 98–111)
Creatinine, Ser: 1.06 mg/dL (ref 0.61–1.24)
GFR calc Af Amer: >60 mL/min (ref >60)
GFR calc non Af Amer: >60 mL/min (ref >60)
Glucose, Bld: 101 mg/dL — ABNORMAL HIGH (ref 70–99)
Potassium: 3.4 mmol/L — ABNORMAL LOW (ref 3.5–5.1)
Sodium: 139 mmol/L (ref 135–145)

## 2017-12-25 LAB — TROPONIN I
Troponin I: <0.03 ng/mL (ref <0.03)
Troponin I: <0.03 ng/mL (ref <0.03)

## 2017-12-25 LAB — ECHOCARDIOGRAM COMPLETE
Height: 71 in
Weight: 3280 oz

## 2017-12-25 MED ORDER — HYDROCHLOROTHIAZIDE 25 MG PO TABS
25.0000 mg | ORAL_TABLET | Freq: Every day | ORAL | Status: DC
  Administered 2017-12-25: 25 mg via ORAL
  Filled 2017-12-25: qty 1

## 2017-12-25 MED ORDER — LOSARTAN POTASSIUM 50 MG PO TABS
100.0000 mg | ORAL_TABLET | Freq: Every day | ORAL | Status: DC
  Administered 2017-12-25: 100 mg via ORAL
  Filled 2017-12-25: qty 2

## 2017-12-25 NOTE — Discharge Instructions (Signed)
Robert [name],   It was a pleasure meeting you and your wife during admission. I'm glad you are feeling better! Your work-up including heart monitoring, EKG, troponins, x-ray, CT head and Echocardiogram of your heart returned normal. We suspect your syncopal episode is related to whats known as "Postprandial Hypotension." This occurs due to blood shifting to your GI tract to assist with digestion and can occur in patients with hypertension.   Please resume your home medications as you were previously taking them. Please remain well-hydrated and well-fed.   Your Hemoglobin A1c did return a little elevated indicating your blood sugars are a little higher than normal. This does not mean you have diabetes, but are at risk for developing it. This can be managed with diet (low carbohydrate intake: reduce sugar intake, bread, pasta, soda, etc) and exercise. This will be communicated to your primary care physician for follow-up as well.   Please schedule an appointment with your primary care physician within the next 1-2 weeks to ensure you are feeling better.

## 2017-12-25 NOTE — Progress Notes (Signed)
  Echocardiogram 2D Echocardiogram has been performed.  Technically difficult study due to patient body habitus.   Robert [name] L Androw 12/25/2017, 11:48 AM

## 2017-12-25 NOTE — Progress Notes (Signed)
   Subjective: Robert [name] was seen and evaluated today at bedside. He was just about to eat breakfast. Robert [name] continues to report he feels in his usual state of health. He had a few crackers last night without issue. Has been able to ambulate around the room without issue. He denies any chest pain, SOB, nausea, vomiting or diarrhea.   Objective:  Vital signs in last 24 hours: Vitals:   12/25/17 0001 12/25/17 0100 12/25/17 0441 12/25/17 1208  BP: (!) 156/87  (!) 172/83 (!) 187/94  Pulse: (!) 56  60 (!) 58  Resp: '16  17 20  '$ Temp: 98.2 F (36.8 C)  98 F (36.7 C) 98.3 F (36.8 C)  TempSrc: Oral  Oral Oral  SpO2: 96%  99% 100%  Weight:  205 lb (93 kg)    Height:       General: Overweight AA male sitting on side of bed about to eat breakfast. Dressed in gown and blue jeans. In no acute distress and looks well HENT: No conjunctival injection, icterus or ptosis. Oropharynx clear, mucous membranes moist.  Cardiovascular: Regular rate and rhythm. No murmur Pulmonary: CTA BL, no wheezing, crackles or rhonchi appreciated. Unlabored breathing.   Skin: Warm, dry. No cyanosis.  Psych: Mood normal and affect was mood congruent. Responds to questions appropriately.   I/O last 3 completed shifts: In: 1000 [IV Piggyback:1000] Out: 1450 [Urine:1450] Total I/O In: 240 [P.O.:240] Out: -   Assessment/Plan:  Active Problems:   Syncope  Syncope Work-up so far unrevealing which is reassuring. ECHO ok, troponins were cycled and not elevated, TSH normal and CBC, CMET are stable. He did not have much PO intake yesterday evening but notes a dinner tray did not come. He was able to have some crackers without issue and was just about to eat breakfast at time of my evaluation. Suspect syncope related to postprandial syncope in setting of prolonged fasting state and mild dehydration. I've asked him to follow-up with his PCP within the next 1-2 weeks for follow-up and to avoid prolonged fasting states as able.     Elevated Hemoglobin A1c A1c returned at 6%. Patient informed; advised to work on lifestyle modifications including a lower-carbohydrate diet. This will be communicated to his PCP in the DC summary for follow-up.   HTN His blood pressures remain elevated however still waiting for medications to return to their steady state. He denies any HA, CP, SOB or neurologic issue to suggest need for symptomatic lowering and suspect his BP will improve as he continues his current outpatient regimen. Will discharge him with his same BP medications and will advise PCP to follow this up at their HFU appt.   Multiple Myeloma Patient is followed closely with oncology and has several upcoming appointments. 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   Dispo: Discharge home TODAY.  Robert Mcilvaine, DO 12/25/2017, 12:59 PM Pager: [phone]

## 2017-12-25 NOTE — Progress Notes (Signed)
Internal Medicine Attending  Date: 12/25/2017  Patient name: Robert [name] Medical record number: [id number] Date of birth: 1934-02-19 Age: 84 y.o. Gender: male  I saw and evaluated the patient. I reviewed the resident's note by Dr. Danford Bad and I agree with the resident's findings and plans as documented in his progress note.  Please see my H&P dated 12/25/2017 for the specifics of my evaluation, assessment, and plan from earlier in the day.

## 2017-12-25 NOTE — H&P (Signed)
Internal Medicine Attending Admission Note Date: 12/25/2017  Patient name: Robert [name] Medical record number: [id number] Date of birth: [birth year] Age: 84 y.o. Gender: male  I saw and evaluated the patient. I reviewed the resident's note and I agree with the resident's findings and plan as documented in the resident's note.  Chief Complaint(s): Syncope  History - key components related to admission:  Robert [name] is a 84 year old man with a history of multiple myeloma status post peripheral stem cell transplant in 2006 with recent relapse, hypertension, and hyperlipidemia who presents after a syncopal episode. On the morning of admission he underwent a CT-guided bone marrow biopsy to assess for myelodysplasia. Prior to this procedure he had fasted for 16 hours. The procedure went well and was uncomplicated and he returned home. He waited a couple more hours before eating and sat down for lunch.  Just prior to the event he began to feel shaky and weak.  He propped himself against the table and his wife reports he had lost consciousness for approximately 2 minutes. He did not fall and hit his head. Upon regaining consciousness his mental status was at baseline and he had no weakness or numbness. This episode was similar to a prior episode approximately 15 years ago. He's had no other episodes of syncope.  When seen on rounds the morning after admission he was feeling back to baseline. He had no further episodes of weakness and shakiness since admission. He was without other acute complaints.  Physical Exam - key components related to admission:  Vitals:   12/24/17 2254 12/25/17 0001 12/25/17 0100 12/25/17 0441  BP: (!) 165/86 (!) 156/87  (!) 172/83  Pulse: (!) 57 (!) 56  60  Resp:  16  17  Temp:  98.2 F (36.8 C)  98 F (36.7 C)  TempSrc:  Oral  Oral  SpO2: 99% 96%  99%  Weight:   205 lb (93 kg)   Height:       Gen.: Well-developed, well-nourished, man sitting comfortably in bed preparing to  eat breakfast. Lungs: Clear to auscultation bilaterally. Heart: Regular rate and rhythm without murmurs, rubs, or gallops. The heart sounds were distant.  Lab results:  Basic Metabolic Panel: Recent Labs    12/24/17 1531 12/25/17 0824  NA 139 139  K 3.9 3.4*  CL 105 105  CO2 27 26  GLUCOSE 130* 101*  BUN 16 12  CREATININE 1.16 1.06  CALCIUM 9.6 9.1  MG 2.0  --    Liver Function Tests: Recent Labs    12/24/17 1531  AST 23  ALT 15  ALKPHOS 62  BILITOT 1.2  PROT 7.4  ALBUMIN 3.7   CBC: Recent Labs    12/24/17 1531 12/25/17 0824  WBC 7.4 4.1  NEUTROABS 5.3  --   HGB 10.6* 9.2*  HCT 32.2* 28.0*  MCV 104.2* 102.6*  PLT 171 144*   Cardiac Enzymes: Recent Labs    12/24/17 1842 12/25/17 0239 12/25/17 0824  TROPONINI <0.03 <0.03 <0.03   Hemoglobin A1C: Recent Labs    12/24/17 1930  HGBA1C 6.0*   Thyroid Function Tests: Recent Labs    12/24/17 1842  TSH 1.614   Coagulation: Recent Labs    12/24/17 0851  INR 1.03   Imaging results:   PA and lateral chest x-ray: Personally reviewed. Prior left-sided rib fracture: No effusions, infiltrates, or masses.  Head CT without contrast: Personally reviewed. No acute bleed.  Other results:  EKG: personally reviewed. Normal sinus rhythm at  65 bpm, normal axis, normal intervals, no significant Q waves, no LVH by voltage, early R wave progression, no ST or T-wave changes.  No significant changes from the prior ECG on 05/13/2015.  Assessment & Plan by Problem:  Robert [name] is a 84 year old man with a history of multiple myeloma status post peripheral stem cell transplant in 2006 with recent relapse, hypertension, and hyperlipidemia who presents after a syncopal episode. The cause of his syncope is unclear. His workup thus far has been unrevealing to an etiology. He has ruled out for myocardial infarction with serial enzymes and ECGs. I do not believe it is related to his bone marrow biopsy although the fasting for 16  hours, taking his medications prior to any oral intake on the morning of admission, and the fact that he was eating suggest he may have had a post prandial syncopal event. What is atypical is the syncope usually occurs 1-2 hours after eating rather than while eating, although he was likely dehydrated with his prolonged nothing by mouth status.  1) Syncope: Evaluation thus far has been unrevealing. If his echocardiogram is unremarkable we will attribute it to the postprandial syncope in the setting of mild dehydration from a prolonged fasting state.  2) Disposition: He is stable for discharge home today with follow-up with his primary care provider and oncologist.

## 2017-12-30 NOTE — Discharge Summary (Signed)
Name: Robert [name] MRN: [id number] DOB: [birth year] 84 y.o. PCP: Leanna Battles, MD  Date of Admission: 12/24/2017  3:10 PM Date of Discharge: 12/25/2017 Attending Physician: Oval Linsey, MD  Discharge Diagnosis: 1. Post Prandial Syncope  Discharge Medications: Allergies as of 12/25/2017   No Known Allergies     Medication List    TAKE these medications   aspirin EC 81 MG tablet Take 81 mg by mouth daily.   bimatoprost 0.01 % Soln Commonly known as:  LUMIGAN Place 1 drop into both eyes at bedtime.   CALTRATE 600+D PLUS PO Take 1 tablet by mouth daily.   carvedilol 25 MG tablet Commonly known as:  COREG Take 25 mg by mouth 2 (two) times daily with a meal.   cloNIDine 0.1 MG tablet Commonly known as:  CATAPRES Take 0.1 mg by mouth 2 (two) times daily as needed (elevated blood pressure >190/80).   [employer] VITAMIN B12 2000 MCG tablet Generic drug:  cyanocobalamin Take 2,000 mcg by mouth at bedtime.   famciclovir 250 MG tablet Commonly known as:  FAMVIR TAKE 1 TABLET DAILY   fluticasone 50 MCG/ACT nasal spray Commonly known as:  FLONASE Place 2 sprays into both nostrils 2 (two) times daily as needed (seasonal allergies).   hydrochlorothiazide 25 MG tablet Commonly known as:  HYDRODIURIL Take 25 mg by mouth daily.   losartan 100 MG tablet Commonly known as:  COZAAR Take 100 mg by mouth daily.   multivitamin with minerals Tabs tablet Take 1 tablet by mouth daily.   potassium chloride SA 20 MEQ tablet Commonly known as:  K-DUR,KLOR-CON Take 20 mEq by mouth daily.   pravastatin 20 MG tablet Commonly known as:  PRAVACHOL Take 20 mg by mouth at bedtime.   PROAIR HFA 108 (90 Base) MCG/ACT inhaler Generic drug:  albuterol Inhale 2 puffs into the lungs every 6 (six) hours as needed for wheezing or shortness of breath.   SYSTANE OP Place 1 drop into both eyes daily as needed (dry eyes).   tadalafil 20 MG tablet Commonly known as:  ADCIRCA/CIALIS Take 20 mg  by mouth daily as needed for erectile dysfunction.   Vitamin D3 1000 units Caps Take 1,000 Units by mouth daily.   ZOMETA 4 MG/100ML IVPB Generic drug:  Zoledronic Acid Inject 4 mg into the vein every 4 (four) months. Administered by Dr. Jonette Eva at [facility] - last infusion mid July 2019       Disposition and follow-up:   Robert [name] was [facility] in [facility] condition.  At the hospital follow up visit please address:  1.  Syncope: Please evaluate for any further episodes of syncope.   2.  Labs / imaging needed at time of follow-up: none  3.  Pending labs/ test needing follow-up: Bone marrow biopsy  Follow-up Appointments: Follow-up Information    Leanna Battles, MD. Schedule an appointment as soon as possible for a visit in 2 week(s).   Specialty:  Internal Medicine Contact information: [place] [address] [place] Hospital Course by problem list: 1. Syncope, Postprandial  84 y/o M with MM s/p peripheral stem cell transplant & recent relapse, HTN and HLD presented 12/24/17 following a syncopal episode. Underwent an uncomplicated CT-guided bone marrow biopsy AM of admission to assess for myelodysplasia. He fasted approximately 16 hrs for the procedure and upon returning home, he took his medications and started eating  lunch. About 15 minutes into his meal, he began to feel shaky, weak and propped himself against the table and lost consciousness for approximately 2 minutes. He did not fall nor hit his head. No seizure like activity and had no post-ictal phase. He had a prior episode about 14 years ago after fasting as well. Otherwise, he's had no further syncope. Lab work unrevealing, CT head normal, CXR negative, CBG without hyperglycemia, troponin's normal, EKG normal, ECHO without obvious etiology and telemetry was without arrhythmia. In the setting of prolonged fasting, taking all medications  at once and the syncopal episode just after breaking his fast is suspicious for a post prandial syncopal episode, especially given history of similar events 14 years ago.   Discharge Vitals:   BP (!) 187/94 (BP Location: Right Arm)   Pulse (!) 58   Temp 98.3 F (36.8 C) (Oral)   Resp 20   Ht _0  (1.803 m)   Wt 93 kg Comment: scale c  SpO2 100%   BMI 28.59 kg/m   Pertinent Labs, Studies, and Procedures:  TTE 12/25/17: Study Conclusions - Left ventricle: The cavity size was normal. Wall thickness was   increased in a pattern of moderate LVH. Systolic function was   normal. The estimated ejection fraction was in the range of 60%   to 65%. Left ventricular diastolic function parameters were   normal. - Aortic valve: Sclerosis without stenosis. There was mild   regurgitation. Valve area (VTI): 2.65 cm^2. Valve area (Vmax):   2.66 cm^2. Valve area (Vmean): 2.46 cm^2. - Mitral valve: Moderately calcified annulus. Moderately thickened,   mildly calcified leaflets . - Left atrium: The atrium was mildly dilated. - Atrial septum: No defect or patent foramen ovale was identified  CT Head w/o contrast: IMPRESSION: 1. No acute intracranial abnormality. 2. Mild age-appropriate generalized atrophy and mild to moderate chronic microvascular ischemic changes of the white matter.  CMP Latest Ref Rng & Units 12/25/2017 12/24/2017 12/09/2017  Glucose 70 - 99 mg/dL 101(H) 130(H) 101  BUN 8 - 23 mg/dL _1 Creatinine 0.61 - 1.24 mg/dL 1.06 1.16 1.20  Sodium 135 - 145 mmol/L 139 139 134  Potassium 3.5 - 5.1 mmol/L 3.4(L) 3.9 3.8  Chloride 98 - 111 mmol/L 105 105 102  CO2 22 - 32 mmol/L _2 Calcium 8.9 - 10.3 mg/dL 9.1 9.6 9.4  Total Protein 6.5 - 8.1 g/dL - 7.4 7.5  Total Bilirubin 0.3 - 1.2 mg/dL - 1.2 1.1  Alkaline Phos 38 - 126 U/L - 62 57  AST 15 - 41 U/L - 23 30  ALT 0 - 44 U/L - 15 24   CBC Latest Ref Rng & Units 12/25/2017 12/24/2017 12/24/2017  WBC 4.0 - 10.5 K/uL 4.1 7.4 4.0    Hemoglobin 13.0 - 17.0 g/dL 9.2(L) 10.6(L) 11.1(L)  Hematocrit 39.0 - 52.0 % 28.0(L) 32.2(L) 32.7(L)  Platelets 150 - 400 K/uL 144(L) 171 188   Signed: Mattilynn Forrer, DO 12/30/2017, 8:01 PM   Pager: [phone]

## 2018-01-06 ENCOUNTER — Encounter (HOSPITAL_COMMUNITY): Payer: Self-pay | Admitting: Hematology & Oncology

## 2018-01-06 ENCOUNTER — Inpatient Hospital Stay: Payer: Medicare Other | Attending: Hematology & Oncology | Admitting: Hematology & Oncology

## 2018-01-06 ENCOUNTER — Encounter: Payer: Self-pay | Admitting: Hematology & Oncology

## 2018-01-06 ENCOUNTER — Inpatient Hospital Stay: Payer: Medicare Other

## 2018-01-06 ENCOUNTER — Other Ambulatory Visit: Payer: Self-pay

## 2018-01-06 VITALS — BP 210/93 | HR 66 | Temp 98.2°F | Resp 16 | Wt 211.0 lb

## 2018-01-06 DIAGNOSIS — Z9484 Stem cells transplant status: Secondary | ICD-10-CM

## 2018-01-06 DIAGNOSIS — C9002 Multiple myeloma in relapse: Secondary | ICD-10-CM | POA: Diagnosis not present

## 2018-01-06 DIAGNOSIS — Z79899 Other long term (current) drug therapy: Secondary | ICD-10-CM | POA: Diagnosis not present

## 2018-01-06 DIAGNOSIS — Z7982 Long term (current) use of aspirin: Secondary | ICD-10-CM | POA: Diagnosis not present

## 2018-01-06 DIAGNOSIS — C9000 Multiple myeloma not having achieved remission: Secondary | ICD-10-CM

## 2018-01-06 LAB — RETICULOCYTES
RBC.: 2.82 MIL/uL — ABNORMAL LOW (ref 4.20–5.82)
Retic Count, Absolute: 28.2 10*3/uL — ABNORMAL LOW (ref 34.8–93.9)
Retic Ct Pct: 1 % (ref 0.8–1.8)

## 2018-01-06 LAB — CBC WITH DIFFERENTIAL (CANCER CENTER ONLY)
Basophils Absolute: 0 10*3/uL (ref 0.0–0.1)
Basophils Relative: 0 %
Eosinophils Absolute: 0 10*3/uL (ref 0.0–0.5)
Eosinophils Relative: 1 %
HCT: 29.2 % — ABNORMAL LOW (ref 38.7–49.9)
Hemoglobin: 9.8 g/dL — ABNORMAL LOW (ref 13.0–17.1)
Lymphocytes Relative: 41 %
Lymphs Abs: 1.7 10*3/uL (ref 0.9–3.3)
MCH: 34.4 pg — ABNORMAL HIGH (ref 28.0–33.4)
MCHC: 33.6 g/dL (ref 32.0–35.9)
MCV: 102.5 fL — ABNORMAL HIGH (ref 82.0–98.0)
Monocytes Absolute: 0.3 10*3/uL (ref 0.1–0.9)
Monocytes Relative: 8 %
Neutro Abs: 2.1 10*3/uL (ref 1.5–6.5)
Neutrophils Relative %: 50 %
Platelet Count: 157 10*3/uL (ref 145–400)
RBC: 2.85 MIL/uL — ABNORMAL LOW (ref 4.20–5.70)
RDW: 13.2 % (ref 11.1–15.7)
WBC Count: 4.1 10*3/uL (ref 4.0–10.0)

## 2018-01-06 LAB — CMP (CANCER CENTER ONLY)
ALT: 21 U/L (ref 10–47)
AST: 27 U/L (ref 11–38)
Albumin: 3.6 g/dL (ref 3.5–5.0)
Alkaline Phosphatase: 57 U/L (ref 26–84)
Anion gap: 3 — ABNORMAL LOW (ref 5–15)
BUN: 14 mg/dL (ref 7–22)
CO2: 28 mmol/L (ref 18–33)
Calcium: 9.4 mg/dL (ref 8.0–10.3)
Chloride: 104 mmol/L (ref 98–108)
Creatinine: 1.2 mg/dL (ref 0.60–1.20)
Glucose, Bld: 97 mg/dL (ref 73–118)
Potassium: 3.7 mmol/L (ref 3.3–4.7)
Sodium: 135 mmol/L (ref 128–145)
Total Bilirubin: 1.1 mg/dL (ref 0.2–1.6)
Total Protein: 7.5 g/dL (ref 6.4–8.1)

## 2018-01-06 MED ORDER — DEXAMETHASONE 4 MG PO TABS: 20.0000 mg | ORAL_TABLET | ORAL | 2 refills | Status: DC

## 2018-01-06 MED ORDER — ASPIRIN EC 325 MG PO TBEC: 325.0000 mg | DELAYED_RELEASE_TABLET | Freq: Every day | ORAL | 6 refills | Status: DC

## 2018-01-06 MED ORDER — LENALIDOMIDE 25 MG PO CAPS: ORAL_CAPSULE | ORAL | 5 refills | Status: DC

## 2018-01-06 NOTE — Progress Notes (Signed)
Hematology and Oncology Follow Up Visit  OSLO HUNTSMAN [id number] [birth year] 84 y.o. 01/06/2018   Principle Diagnosis:  IgG Kappa Myeloma-Relapsed  Current Therapy:  RVD  --  Start 01/27/2018  Zometa 4 mg IV q. 4 months   Interim History:  Mr. Genis is here today for follow-up.  He comes in with his wife.  Typically, this usually means that there is an issue.  Her other clearly is an issue now.  He definitely is going to have to get into therapy.  We did do a bone marrow biopsy on him.  This was done on 12/24/2017.  The pathology report ([id number]) showed 10% plasma cells.  The real problem is that on his [facility] panel, he has a trisomy 11 and a 13q-genetic abnormality.  He has never had this before.  I think that we are going to have to treat him now.  I waited as long as possible.  His last myeloma studies that were done about a month ago showed an M spike of 1.7 g/dL.  His IgG level was 2200 mg/dL.  His Kappa Light chain was 12.7 mg/dL.  His blood pressure still a problem.  I am sure that his family doctor is working on this.  He has not been treated now probably for about 12 years.  He has done incredibly well post transplant.  I do think that it would be reasonable to treat him with Revlimid/Velcade/Decadron (RVD).  I know that he did receive Velcade back for induction back in 2006.  As such, I think that re-trying Velcade would be worthwhile with Revlimid.  He has no bony pain.  I will need to set him up with a PET scan to see if he has active bony lesions from myeloma.   Overall, his performance status is still quite good.  You would never know that he is 84 years old.  I just where she would exercise a little bit more.  His performance status is ECOG 1.   Medications:  Allergies as of 01/06/2018   No Known Allergies     Medication List        Accurate as of 01/06/18  3:05 PM. Always use your most recent med list.          aspirin EC 325 MG tablet Take 1 tablet (325 mg  total) by mouth daily.   bimatoprost 0.01 % Soln Commonly known as:  LUMIGAN Place 1 drop into both eyes at bedtime.   CALTRATE 600+D PLUS PO Take 1 tablet by mouth daily.   carvedilol 25 MG tablet Commonly known as:  COREG Take 25 mg by mouth 2 (two) times daily with a meal.   cloNIDine 0.1 MG tablet Commonly known as:  CATAPRES Take 0.1 mg by mouth 2 (two) times daily as needed (elevated blood pressure >190/80).   CVS VITAMIN B12 2000 MCG tablet Generic drug:  cyanocobalamin Take 2,000 mcg by mouth at bedtime.   dexamethasone 4 MG tablet Commonly known as:  DECADRON Take 5 tablets (20 mg total) by mouth once a week.   famciclovir 250 MG tablet Commonly known as:  FAMVIR TAKE 1 TABLET DAILY   fluticasone 50 MCG/ACT nasal spray Commonly known as:  FLONASE Place 2 sprays into both nostrils 2 (two) times daily as needed (seasonal allergies).   hydrochlorothiazide 25 MG tablet Commonly known as:  HYDRODIURIL Take 25 mg by mouth daily.   lenalidomide 25 MG capsule Commonly known as:  REVLIMID Take 1 capsule daily  at bedtime for 21 days on then 7 days off.   losartan 100 MG tablet Commonly known as:  COZAAR Take 100 mg by mouth daily.   multivitamin with minerals Tabs tablet Take 1 tablet by mouth daily.   potassium chloride SA 20 MEQ tablet Commonly known as:  K-DUR,KLOR-CON Take 20 mEq by mouth daily.   pravastatin 20 MG tablet Commonly known as:  PRAVACHOL Take 20 mg by mouth at bedtime.   PROAIR HFA 108 (90 Base) MCG/ACT inhaler Generic drug:  albuterol Inhale 2 puffs into the lungs every 6 (six) hours as needed for wheezing or shortness of breath.   SYSTANE OP Place 1 drop into both eyes daily as needed (dry eyes).   tadalafil 20 MG tablet Commonly known as:  ADCIRCA/CIALIS Take 20 mg by mouth daily as needed for erectile dysfunction.   Vitamin D3 1000 units Caps Take 1,000 Units by mouth daily.   ZOMETA 4 MG/100ML IVPB Generic drug:  Zoledronic  Acid Inject 4 mg into the vein every 4 (four) months. Administered by Dr. Jonette Eva at [facility] - last infusion mid July 2019       Allergies: No Known Allergies  Past Medical History, Surgical history, Social history, and Family History were reviewed and updated.  Review of Systems: Review of Systems  Constitutional: Negative.   HENT: Negative.   Eyes: Negative.   Respiratory: Negative.  Negative for cough.   Cardiovascular: Negative.   Gastrointestinal: Negative.   Genitourinary: Negative.   Musculoskeletal: Negative.   Skin: Negative.   Neurological: Negative.   Endo/Heme/Allergies: Negative.   Psychiatric/Behavioral: Negative.      Physical Exam:  weight is 211 lb (95.7 kg). His oral temperature is 98.2 F (36.8 C). His blood pressure is 210/93 (abnormal) and his pulse is 66. His respiration is 16 and oxygen saturation is 100%.   Wt Readings from Last 3 Encounters:  01/06/18 211 lb (95.7 kg)  12/25/17 205 lb (93 kg)  12/09/17 212 lb (96.2 kg)    Physical Exam  Constitutional: He is oriented to person, place, and time.  HENT:  Head: Normocephalic and atraumatic.  Mouth/Throat: Oropharynx is clear and moist.  Eyes: Pupils are equal, round, and reactive to light. EOM are normal.  Neck: Normal range of motion.  Cardiovascular: Normal rate, regular rhythm and normal heart sounds.  Pulmonary/Chest: Effort normal and breath sounds normal.  Abdominal: Soft. Bowel sounds are normal.  Musculoskeletal: Normal range of motion. He exhibits no edema, tenderness or deformity.  Lymphadenopathy:    He has no cervical adenopathy.  Neurological: He is alert and oriented to person, place, and time.  Skin: Skin is warm and dry. No rash noted. No erythema.  Psychiatric: He has a normal mood and affect. His behavior is normal. Judgment and thought content normal.  Vitals reviewed.   Lab Results  Component Value Date   WBC 4.1 01/06/2018   HGB 9.8 (L) 01/06/2018    HCT 29.2 (L) 01/06/2018   MCV 102.5 (H) 01/06/2018   PLT 157 01/06/2018   Lab Results  Component Value Date   FERRITIN 265 10/29/2015   IRON 93 10/29/2015   TIBC 280 10/29/2015   UIBC 187 10/29/2015   IRONPCTSAT 33 10/29/2015   Lab Results  Component Value Date   RBC 2.85 (L) 01/06/2018   Lab Results  Component Value Date   KPAFRELGTCHN 127.4 (H) 12/09/2017   LAMBDASER 13.8 12/09/2017   KAPLAMBRATIO 9.23 (H) 12/09/2017   Lab Results  Component Value Date   IGGSERUM 2,193 (H) 12/09/2017   IGGSERUM 2,240 (H) 12/09/2017   IGA 73 12/09/2017   IGA 78 12/09/2017   IGMSERUM 88 12/09/2017   IGMSERUM 92 12/09/2017   Lab Results  Component Value Date   TOTALPROTELP 7.2 12/09/2017   ALBUMINELP 3.5 12/09/2017   A1GS 0.2 12/09/2017   A2GS 0.6 12/09/2017   BETS 0.9 12/09/2017   BETA2SER 0.3 01/03/2015   GAMS 2.1 (H) 12/09/2017   MSPIKE 1.7 (H) 12/09/2017   SPEI * 01/03/2015     Chemistry      Component Value Date/Time   NA 135 01/06/2018 1338   NA 137 04/08/2017 1141   NA 138 10/29/2015 1029   K 3.7 01/06/2018 1338   K 3.4 04/08/2017 1141   K 3.9 10/29/2015 1029   CL 104 01/06/2018 1338   CL 102 04/08/2017 1141   CO2 28 01/06/2018 1338   CO2 30 04/08/2017 1141   CO2 27 10/29/2015 1029   BUN 14 01/06/2018 1338   BUN 16 04/08/2017 1141   BUN 13.8 10/29/2015 1029   CREATININE 1.20 01/06/2018 1338   CREATININE 1.3 (H) 04/08/2017 1141   CREATININE 1.1 10/29/2015 1029      Component Value Date/Time   CALCIUM 9.4 01/06/2018 1338   CALCIUM 9.3 04/08/2017 1141   CALCIUM 9.3 10/29/2015 1029   ALKPHOS 57 01/06/2018 1338   ALKPHOS 66 04/08/2017 1141   ALKPHOS 52 10/29/2015 1029   AST 27 01/06/2018 1338   AST 20 10/29/2015 1029   ALT 21 01/06/2018 1338   ALT 21 04/08/2017 1141   ALT 14 10/29/2015 1029   BILITOT 1.1 01/06/2018 1338   BILITOT 0.75 10/29/2015 1029      Impression and Plan: Mr. Boursiquot is a very pleasant 84 yo gentleman with IgG Kappa myeloma with  history of stem cell transplant in 2006.  We will go ahead and get him started on treatment in early September.  I would like to give him Labor Day off without any treatment.  I think we have the flexibility to be able to start treatment after Labor Day weekend.  I gave him his wife information sheets about each medication.  They no real well the side effects.  He will start taking a full dose aspirin.  The Revlimid can increase risk of blood clots.  We already have him on famciclovir.  I will try to get the PET scan set up for him in a couple weeks.  He actually will see Dr. Laverta Baltimore at [facility] next week.  I will plan to see him back when he starts his treatment.  I spent about 45 minutes with he and his wife.  I spent the time face-to-face.  I counseled them up.  I help to coordinate his treatment protocol.  I answered all their questions.    Volanda Napoleon, MD 8/15/20193:05 PM

## 2018-01-06 NOTE — Progress Notes (Signed)
START ON PATHWAY REGIMEN - Multiple Myeloma and Other Plasma Cell Dyscrasias     A cycle is every 21 days:     Bortezomib      Lenalidomide      Dexamethasone   **Always confirm dose/schedule in your pharmacy ordering system**  Patient Characteristics: Newly Diagnosed, Transplant Ineligible or Refused, High Risk R-ISS Staging: III Disease Classification: Newly Diagnosed Is Patient Eligible for Transplant<= Transplant Ineligible or Refused Risk Status: High Risk Intent of Therapy: Non-Curative / Palliative Intent, Discussed with Patient

## 2018-01-07 LAB — IGG, IGA, IGM
IgA: 76 mg/dL (ref 61–437)
IgG (Immunoglobin G), Serum: 2126 mg/dL — ABNORMAL HIGH (ref 700–1600)
IgM (Immunoglobulin M), Srm: 81 mg/dL (ref 15–143)

## 2018-01-07 LAB — KAPPA/LAMBDA LIGHT CHAINS
Kappa free light chain: 140.4 mg/L — ABNORMAL HIGH (ref 3.3–19.4)
Kappa, lambda light chain ratio: 9.55 — ABNORMAL HIGH (ref 0.26–1.65)
Lambda free light chains: 14.7 mg/L (ref 5.7–26.3)

## 2018-01-07 LAB — IRON AND TIBC
Iron: 91 ug/dL (ref 42–163)
Saturation Ratios: 34 % — ABNORMAL LOW (ref 42–163)
TIBC: 265 ug/dL (ref 202–409)
UIBC: 175 ug/dL

## 2018-01-07 LAB — ERYTHROPOIETIN: Erythropoietin: 33.8 m[IU]/mL — ABNORMAL HIGH (ref 2.6–18.5)

## 2018-01-07 LAB — FERRITIN: Ferritin: 383 ng/mL — ABNORMAL HIGH (ref 24–336)

## 2018-01-10 ENCOUNTER — Ambulatory Visit: Payer: Medicare Other | Admitting: Hematology & Oncology

## 2018-01-10 ENCOUNTER — Telehealth: Payer: Self-pay | Admitting: *Deleted

## 2018-01-10 ENCOUNTER — Telehealth: Payer: Self-pay | Admitting: Pharmacist

## 2018-01-10 ENCOUNTER — Other Ambulatory Visit: Payer: Medicare Other

## 2018-01-10 DIAGNOSIS — C9000 Multiple myeloma not having achieved remission: Secondary | ICD-10-CM | POA: Diagnosis not present

## 2018-01-10 DIAGNOSIS — I10 Essential (primary) hypertension: Secondary | ICD-10-CM | POA: Diagnosis not present

## 2018-01-10 DIAGNOSIS — Z9481 Bone marrow transplant status: Secondary | ICD-10-CM | POA: Diagnosis not present

## 2018-01-10 LAB — PROTEIN ELECTROPHORESIS, SERUM, WITH REFLEX
A/G Ratio: 1.1 (ref 0.7–1.7)
Albumin ELP: 3.9 g/dL (ref 2.9–4.4)
Alpha-1-Globulin: 0.2 g/dL (ref 0.0–0.4)
Alpha-2-Globulin: 0.6 g/dL (ref 0.4–1.0)
Beta Globulin: 0.7 g/dL (ref 0.7–1.3)
Gamma Globulin: 1.9 g/dL — ABNORMAL HIGH (ref 0.4–1.8)
Globulin, Total: 3.4 g/dL (ref 2.2–3.9)
M-Spike, %: 1.6 g/dL — ABNORMAL HIGH
SPEP Interpretation: 0
Total Protein ELP: 7.3 g/dL (ref 6.0–8.5)

## 2018-01-10 LAB — IMMUNOFIXATION REFLEX, SERUM
IgA: 75 mg/dL (ref 61–437)
IgG (Immunoglobin G), Serum: 2214 mg/dL — ABNORMAL HIGH (ref 700–1600)
IgM (Immunoglobulin M), Srm: 81 mg/dL (ref 15–143)

## 2018-01-10 NOTE — Telephone Encounter (Signed)
Oral Oncology Pharmacist Encounter  Received new prescription for Revlimid (lenalidomide) for the treatment of relapsed multiple myeloma in conjunction with velcade and dexamethasone, planned duration until disease progression or unacceptable drug toxicity.  CBC/CMP from 01/06/18 assessed, no relevant lab abnormalities. Prescription dose and frequency assessed. Pt is close to a reduction based on renal function, recommend monitoring his renal function for a need to dose adjust. He has been instructed to begin aspirin and he ia already taking famciclovir.   Current medication list in Epic reviewed, no relevant DDIs with Revlimid identified.  Prescription has been e-scribed to the [employer] for benefits analysis and approval.  Oral Oncology Clinic will continue to follow for insurance authorization and copayment issues.   Darl Pikes, PharmD, BCPS, [facility] Hematology/Oncology Clinical Pharmacist [facility] [phone]  01/10/2018 9:10 AM

## 2018-01-10 NOTE — Telephone Encounter (Signed)
Patient left message this AM with his current address to confirm for delivery of Revlimid.  Call back to patient and message left to confirm that address he left is the address we have in his chart.  Instructed pt to call back with any further concerns.

## 2018-01-11 ENCOUNTER — Telehealth: Payer: Self-pay | Admitting: Pharmacy Technician

## 2018-01-11 NOTE — Telephone Encounter (Signed)
Oral Oncology Patient Advocate Encounter  I faxed the prior authorization approval information to [place].  The fax number is [phone].   [place] Patient [place] Phone [phone] Fax [phone] 01/11/2018 1:09 PM

## 2018-01-11 NOTE — Telephone Encounter (Signed)
Oral Chemotherapy Pharmacist Encounter  Due to insurance restriction the medication could not be filled at [place]. The prescription was transferred to [place] by [employer].  Supportive information was faxed to [place]. We will continue to follow medication access.   Darl Pikes, PharmD, BCPS, BCOP Hematology/Oncology Clinical Pharmacist [facility] [place] [facility] [phone]  01/11/2018 9:00 AM

## 2018-01-11 NOTE — Telephone Encounter (Signed)
Oral Oncology Patient Advocate Encounter  Prior Authorization for Revlimid has been approved through [place].    PA# [id number] Effective dates: 12/12/2017 through 05/24/2098  Oral Oncology Clinic will continue to follow.   [place] Patient [place] Phone [phone] Fax [phone] 01/11/2018 10:28 AM

## 2018-01-14 ENCOUNTER — Telehealth: Payer: Self-pay | Admitting: Pharmacy Technician

## 2018-01-14 NOTE — Telephone Encounter (Signed)
Oral Oncology Patient Advocate Encounter  I called [place] to check prescription status of Revlimid.  Everything was complete except for scheduling shipment.  I called Robert [name] and he agreed to speak with the [facility] representative.  I added him to the call and delivery was scheduled.  Delivery date has been set for 01/21/18 so he will have the medication on hand to start after Labor Day.  Copay is $28/month and that is affordable per Robert [name].    The number for [facility] is [phone].  [name] Phone [phone] Fax [phone] 01/14/2018 10:20 AM

## 2018-01-21 ENCOUNTER — Encounter (HOSPITAL_COMMUNITY): Admission: RE | Admit: 2018-01-21 | Payer: Medicare Other | Source: Ambulatory Visit

## 2018-01-27 ENCOUNTER — Other Ambulatory Visit: Payer: Self-pay

## 2018-01-27 ENCOUNTER — Inpatient Hospital Stay: Payer: Medicare Other

## 2018-01-27 ENCOUNTER — Encounter: Payer: Self-pay | Admitting: Hematology & Oncology

## 2018-01-27 ENCOUNTER — Inpatient Hospital Stay: Payer: Medicare Other | Attending: Hematology & Oncology | Admitting: Hematology & Oncology

## 2018-01-27 VITALS — BP 175/90 | HR 58 | Temp 97.9°F | Resp 18 | Wt 210.0 lb

## 2018-01-27 DIAGNOSIS — Z5112 Encounter for antineoplastic immunotherapy: Secondary | ICD-10-CM | POA: Insufficient documentation

## 2018-01-27 DIAGNOSIS — C9002 Multiple myeloma in relapse: Secondary | ICD-10-CM

## 2018-01-27 DIAGNOSIS — Z9484 Stem cells transplant status: Secondary | ICD-10-CM | POA: Insufficient documentation

## 2018-01-27 DIAGNOSIS — N189 Chronic kidney disease, unspecified: Secondary | ICD-10-CM | POA: Diagnosis not present

## 2018-01-27 DIAGNOSIS — Z79899 Other long term (current) drug therapy: Secondary | ICD-10-CM | POA: Diagnosis not present

## 2018-01-27 DIAGNOSIS — D631 Anemia in chronic kidney disease: Secondary | ICD-10-CM | POA: Insufficient documentation

## 2018-01-27 LAB — CBC WITH DIFFERENTIAL (CANCER CENTER ONLY)
Basophils Absolute: 0 10*3/uL (ref 0.0–0.1)
Basophils Relative: 0 %
Eosinophils Absolute: 0 10*3/uL (ref 0.0–0.5)
Eosinophils Relative: 1 %
HCT: 28.9 % — ABNORMAL LOW (ref 38.7–49.9)
Hemoglobin: 9.6 g/dL — ABNORMAL LOW (ref 13.0–17.1)
Lymphocytes Relative: 39 %
Lymphs Abs: 1.4 10*3/uL (ref 0.9–3.3)
MCH: 34.5 pg — ABNORMAL HIGH (ref 28.0–33.4)
MCHC: 33.2 g/dL (ref 32.0–35.9)
MCV: 104 fL — ABNORMAL HIGH (ref 82.0–98.0)
Monocytes Absolute: 0.3 10*3/uL (ref 0.1–0.9)
Monocytes Relative: 8 %
Neutro Abs: 1.9 10*3/uL (ref 1.5–6.5)
Neutrophils Relative %: 52 %
Platelet Count: 167 10*3/uL (ref 145–400)
RBC: 2.78 MIL/uL — ABNORMAL LOW (ref 4.20–5.70)
RDW: 13.5 % (ref 11.1–15.7)
WBC Count: 3.6 10*3/uL — ABNORMAL LOW (ref 4.0–10.0)

## 2018-01-27 LAB — CMP (CANCER CENTER ONLY)
ALT: 20 U/L (ref 10–47)
AST: 29 U/L (ref 11–38)
Albumin: 3.7 g/dL (ref 3.5–5.0)
Alkaline Phosphatase: 59 U/L (ref 26–84)
Anion gap: 0 — ABNORMAL LOW (ref 5–15)
BUN: 13 mg/dL (ref 7–22)
CO2: 29 mmol/L (ref 18–33)
Calcium: 9.5 mg/dL (ref 8.0–10.3)
Chloride: 106 mmol/L (ref 98–108)
Creatinine: 1.3 mg/dL — ABNORMAL HIGH (ref 0.60–1.20)
Glucose, Bld: 95 mg/dL (ref 73–118)
Potassium: 3.6 mmol/L (ref 3.3–4.7)
Sodium: 133 mmol/L (ref 128–145)
Total Bilirubin: 1 mg/dL (ref 0.2–1.6)
Total Protein: 7.5 g/dL (ref 6.4–8.1)

## 2018-01-27 MED ORDER — FAMCICLOVIR 250 MG PO TABS: 250.0000 mg | ORAL_TABLET | Freq: Every day | ORAL | 3 refills | Status: DC

## 2018-01-27 MED ORDER — BORTEZOMIB CHEMO SQ INJECTION 3.5 MG (2.5MG/ML)
1.3000 mg/m2 | Freq: Once | INTRAMUSCULAR | Status: AC
  Administered 2018-01-27: 2.75 mg via SUBCUTANEOUS
  Filled 2018-01-27: qty 1.1

## 2018-01-27 MED ORDER — PROCHLORPERAZINE MALEATE 10 MG PO TABS
10.0000 mg | ORAL_TABLET | Freq: Once | ORAL | Status: AC
  Administered 2018-01-27: 10 mg via ORAL

## 2018-01-27 MED ORDER — PROCHLORPERAZINE MALEATE 10 MG PO TABS
ORAL_TABLET | ORAL | Status: AC
  Filled 2018-01-27: qty 1

## 2018-01-27 NOTE — Patient Instructions (Signed)
Prochlorperazine injection What is this medicine? PROCHLORPERAZINE (proe klor PER a zeen) helps to control severe nausea and vomiting. This medicine is also used to treat schizophrenia. It can also help patients who experience anxiety that is not due to psychological illness. This medicine may be used for other purposes; ask your health care provider or pharmacist if you have questions. COMMON BRAND NAME(S): Compazine What should I tell my health care provider before I take this medicine? They need to know if you have any of these conditions: -blood disorders or disease -dementia -liver disease or jaundice -Parkinson's disease -uncontrollable movement disorder -an unusual or allergic reaction to prochlorperazine, other medicines, foods, dyes, or preservatives -pregnant or trying to get pregnant -breast-feeding How should I use this medicine? This medicine is for injection into a muscle, or injection or infusion into a vein. It is given by a health care professional in a hospital or clinic setting. Talk to your pediatrician regarding the use of this medicine in children. While this drug may be prescribed for children as young as 39 years of age for selected conditions, precautions do apply. Overdosage: If you think you have taken too much of this medicine contact a poison control center or emergency room at once. NOTE: This medicine is only for you. Do not share this medicine with others. What if I miss a dose? This does not apply. What may interact with this medicine? Do not take this medicine with any of the following medications: -amoxapine -antidepressants like citalopram, escitalopram, fluoxetine, paroxetine, and sertraline -deferoxamine -dofetilide -maprotiline -tricyclic antidepressants like amitriptyline, clomipramine, imipramine, nortriptyline and others This medicine may also interact with the following medications: -lithium -medicines for  pain -phenytoin -propranolol -warfarin This list may not describe all possible interactions. Give your health care provider a list of all the medicines, herbs, non-prescription drugs, or dietary supplements you use. Also tell them if you smoke, drink alcohol, or use illegal drugs. Some items may interact with your medicine. What should I watch for while using this medicine? Your condition will be monitored carefully while you are receiving this medicine. You may get drowsy or dizzy. Do not drive, use machinery, or do anything that needs mental alertness until you know how this medicine affects you. Do not stand or sit up quickly, especially if you are an older patient. This reduces the risk of dizzy or fainting spells. Alcohol may interfere with the effect of this medicine. Avoid alcoholic drinks. This medicine can reduce the response of your body to heat or cold. Dress warm in cold weather and stay hydrated in hot weather. If possible, avoid extreme temperatures like saunas, hot tubs, very hot or cold showers, or activities that can cause dehydration such as vigorous exercise. This medicine can make you more sensitive to the sun. Keep out of the sun. If you cannot avoid being in the sun, wear protective clothing and use sunscreen. Do not use sun lamps or tanning beds/booths. Your mouth may get dry. Chewing sugarless gum or sucking hard candy, and drinking plenty of water may help. Contact your doctor if the problem does not go away or is severe. What side effects may I notice from receiving this medicine? Side effects that you should report to your doctor or health care professional as soon as possible: -abnormal production of milk in females -allergic reactions like skin rash, itching or hives, swelling of the face, lips, or tongue -blurred vision -breast enlargement in both males and females -breathing problems -chest pain, fast or  irregular heartbeat -confusion, restlessness -dark yellow or  brown urine -dizziness or fainting spells -drooling, shaking, movement difficulty, or rigidity -fever, chills, sore throat -involuntary or uncontrollable movements of the eyes, mouth, head, arms, and legs -seizures -stomach area pain -unusually weak or tired -unusual bleeding or bruising -yellowing of skin or eyes Side effects that usually do not require medical attention (report to your doctor or health care professional if they continue or are bothersome): -difficulty passing urine -difficulty sleeping -headache -sexual dysfunction This list may not describe all possible side effects. Call your doctor for medical advice about side effects. You may report side effects to FDA at 1-800-FDA-1088. Where should I keep my medicine? This drug is given in a hospital or clinic and will not be stored at home. NOTE: This sheet is a summary. It may not cover all possible information. If you have questions about this medicine, talk to your doctor, pharmacist, or health care provider.  2018 Elsevier/Gold Standard (2011-09-29 16:58:03) Bortezomib injection What is this medicine? BORTEZOMIB (bor TEZ oh mib) is a medicine that targets proteins in cancer cells and stops the cancer cells from growing. It is used to treat multiple myeloma and mantle-cell lymphoma. This medicine may be used for other purposes; ask your health care provider or pharmacist if you have questions. COMMON BRAND NAME(S): Velcade What should I tell my health care provider before I take this medicine? They need to know if you have any of these conditions: -diabetes -heart disease -irregular heartbeat -liver disease -on hemodialysis -low blood counts, like low white blood cells, platelets, or hemoglobin -peripheral neuropathy -taking medicine for blood pressure -an unusual or allergic reaction to bortezomib, mannitol, boron, other medicines, foods, dyes, or preservatives -pregnant or trying to get  pregnant -breast-feeding How should I use this medicine? This medicine is for injection into a vein or for injection under the skin. It is given by a health care professional in a hospital or clinic setting. Talk to your pediatrician regarding the use of this medicine in children. Special care may be needed. Overdosage: If you think you have taken too much of this medicine contact a poison control center or emergency room at once. NOTE: This medicine is only for you. Do not share this medicine with others. What if I miss a dose? It is important not to miss your dose. Call your doctor or health care professional if you are unable to keep an appointment. What may interact with this medicine? This medicine may interact with the following medications: -ketoconazole -rifampin -ritonavir -St. John's Wort This list may not describe all possible interactions. Give your health care provider a list of all the medicines, herbs, non-prescription drugs, or dietary supplements you use. Also tell them if you smoke, drink alcohol, or use illegal drugs. Some items may interact with your medicine. What should I watch for while using this medicine? You may get drowsy or dizzy. Do not drive, use machinery, or do anything that needs mental alertness until you know how this medicine affects you. Do not stand or sit up quickly, especially if you are an older patient. This reduces the risk of dizzy or fainting spells. In some cases, you may be given additional medicines to help with side effects. Follow all directions for their use. Call your doctor or health care professional for advice if you get a fever, chills or sore throat, or other symptoms of a cold or flu. Do not treat yourself. This drug decreases your body's ability to fight  infections. Try to avoid being around people who are sick. This medicine may increase your risk to bruise or bleed. Call your doctor or health care professional if you notice any unusual  bleeding. You may need blood work done while you are taking this medicine. In some patients, this medicine may cause a serious brain infection that may cause death. If you have any problems seeing, thinking, speaking, walking, or standing, tell your doctor right away. If you cannot reach your doctor, urgently seek other source of medical care. Check with your doctor or health care professional if you get an attack of severe diarrhea, nausea and vomiting, or if you sweat a lot. The loss of too much body fluid can make it dangerous for you to take this medicine. Do not become pregnant while taking this medicine or for at least 2 months after stopping it. Women should inform their doctor if they wish to become pregnant or think they might be pregnant. Men should not father a child while taking this medicine and for at least 2 months after stopping it. There is a potential for serious side effects to an unborn child. Talk to your health care professional or pharmacist for more information. Do not breast-feed an infant while taking this medicine or for 2 months after stopping it. This medicine may interfere with the ability to have a child. You should talk with your doctor or health care professional if you are concerned about your fertility. What side effects may I notice from receiving this medicine? Side effects that you should report to your doctor or health care professional as soon as possible: -allergic reactions like skin rash, itching or hives, swelling of the face, lips, or tongue -breathing problems -changes in hearing -changes in vision -fast, irregular heartbeat -feeling faint or lightheaded, falls -pain, tingling, numbness in the hands or feet -right upper belly pain -seizures -swelling of the ankles, feet, hands -unusual bleeding or bruising -unusually weak or tired -vomiting -yellowing of the eyes or skin Side effects that usually do not require medical attention (report to your  doctor or health care professional if they continue or are bothersome): -changes in emotions or moods -constipation -diarrhea -loss of appetite -headache -irritation at site where injected -nausea This list may not describe all possible side effects. Call your doctor for medical advice about side effects. You may report side effects to FDA at 1-800-FDA-1088. Where should I keep my medicine? This drug is given in a hospital or clinic and will not be stored at home. NOTE: This sheet is a summary. It may not cover all possible information. If you have questions about this medicine, talk to your doctor, pharmacist, or health care provider.  2018 Elsevier/Gold Standard (2016-04-09 15:53:51)

## 2018-01-27 NOTE — Progress Notes (Signed)
Hematology and Oncology Follow Up Visit  TOR TSUDA [id number] 11-Jun-1933 84 y.o. 01/27/2018   Principle Diagnosis:  IgG Kappa Myeloma-Relapsed  Current Therapy:  RVD  --  Start 01/27/2018  Zometa 4 mg IV q. 4 months   Interim History:  Mr. Cessna is here today for follow-up.  He comes in with his wife.  He will now start therapy for myeloma.  He has the Revlimid.  We will start the Velcade today.  He is doing okay.  We do not have his PET scan yet.  This will be done next week.  We last saw him, his M spike was 1.6 g/dL.  His IgG level was 2240 mg/dL.  His Kappa Lightchain was 14 mg/dL.  His blood pressure is doing a little bit better.  I really think that the anemia that we have been seeing probably is from his kidneys and I suspect that his erythropoietin level is low.  We checked his erythropoietin level last month.  It was 34.  His iron studies have been doing okay.  In August, his ferritin was 383 with an iron saturation of 34%.    His performance status is ECOG 1.   Medications:  Allergies as of 01/27/2018   No Known Allergies     Medication List        Accurate as of 01/27/18  3:23 PM. Always use your most recent med list.          aspirin EC 325 MG tablet Take 1 tablet (325 mg total) by mouth daily.   bimatoprost 0.01 % Soln Commonly known as:  LUMIGAN Place 1 drop into both eyes at bedtime.   CALTRATE 600+D PLUS PO Take 1 tablet by mouth daily.   carvedilol 25 MG tablet Commonly known as:  COREG Take 25 mg by mouth 2 (two) times daily with a meal.   cloNIDine 0.1 MG tablet Commonly known as:  CATAPRES Take 0.1 mg by mouth 2 (two) times daily as needed (elevated blood pressure >190/80).   [employer] VITAMIN B12 2000 MCG tablet Generic drug:  cyanocobalamin Take 2,000 mcg by mouth at bedtime.   dexamethasone 4 MG tablet Commonly known as:  DECADRON Take 5 tablets (20 mg total) by mouth once a week.   famciclovir 250 MG tablet Commonly known as:   FAMVIR TAKE 1 TABLET DAILY   fluticasone 50 MCG/ACT nasal spray Commonly known as:  FLONASE Place 2 sprays into both nostrils 2 (two) times daily as needed (seasonal allergies).   hydrochlorothiazide 25 MG tablet Commonly known as:  HYDRODIURIL Take 25 mg by mouth daily.   lenalidomide 25 MG capsule Commonly known as:  REVLIMID Take 1 capsule daily at bedtime for 21 days on then 7 days off.   losartan 100 MG tablet Commonly known as:  COZAAR Take 100 mg by mouth daily.   multivitamin with minerals Tabs tablet Take 1 tablet by mouth daily.   potassium chloride SA 20 MEQ tablet Commonly known as:  K-DUR,KLOR-CON Take 20 mEq by mouth daily.   pravastatin 20 MG tablet Commonly known as:  PRAVACHOL Take 20 mg by mouth at bedtime.   PROAIR HFA 108 (90 Base) MCG/ACT inhaler Generic drug:  albuterol Inhale 2 puffs into the lungs every 6 (six) hours as needed for wheezing or shortness of breath.   SYSTANE OP Place 1 drop into both eyes daily as needed (dry eyes).   tadalafil 20 MG tablet Commonly known as:  ADCIRCA/CIALIS Take 20 mg by  mouth daily as needed for erectile dysfunction.   Vitamin D3 1000 units Caps Take 1,000 Units by mouth daily.   ZOMETA 4 MG/100ML IVPB Generic drug:  Zoledronic Acid Inject 4 mg into the vein every 4 (four) months. Administered by Dr. Jonette Eva at [facility] - last infusion mid July 2019       Allergies: No Known Allergies  Past Medical History, Surgical history, Social history, and Family History were reviewed and updated.  Review of Systems: Review of Systems  Constitutional: Negative.   HENT: Negative.   Eyes: Negative.   Respiratory: Negative.  Negative for cough.   Cardiovascular: Negative.   Gastrointestinal: Negative.   Genitourinary: Negative.   Musculoskeletal: Negative.   Skin: Negative.   Neurological: Negative.   Endo/Heme/Allergies: Negative.   Psychiatric/Behavioral: Negative.      Physical Exam:   weight is 210 lb (95.3 kg). His oral temperature is 97.9 F (36.6 C). His blood pressure is 175/90 (abnormal) and his pulse is 58 (abnormal). His respiration is 18 and oxygen saturation is 100%.   Wt Readings from Last 3 Encounters:  01/27/18 210 lb (95.3 kg)  01/06/18 211 lb (95.7 kg)  12/25/17 205 lb (93 kg)    Physical Exam  Constitutional: He is oriented to person, place, and time.  HENT:  Head: Normocephalic and atraumatic.  Mouth/Throat: Oropharynx is clear and moist.  Eyes: Pupils are equal, round, and reactive to light. EOM are normal.  Neck: Normal range of motion.  Cardiovascular: Normal rate, regular rhythm and normal heart sounds.  Pulmonary/Chest: Effort normal and breath sounds normal.  Abdominal: Soft. Bowel sounds are normal.  Musculoskeletal: Normal range of motion. He exhibits no edema, tenderness or deformity.  Lymphadenopathy:    He has no cervical adenopathy.  Neurological: He is alert and oriented to person, place, and time.  Skin: Skin is warm and dry. No rash noted. No erythema.  Psychiatric: He has a normal mood and affect. His behavior is normal. Judgment and thought content normal.  Vitals reviewed.   Lab Results  Component Value Date   WBC 3.6 (L) 01/27/2018   HGB 9.6 (L) 01/27/2018   HCT 28.9 (L) 01/27/2018   MCV 104.0 (H) 01/27/2018   PLT 167 01/27/2018   Lab Results  Component Value Date   FERRITIN 383 (H) 01/06/2018   IRON 91 01/06/2018   TIBC 265 01/06/2018   UIBC 175 01/06/2018   IRONPCTSAT 34 (L) 01/06/2018   Lab Results  Component Value Date   RETICCTPCT 1.0 01/06/2018   RBC 2.78 (L) 01/27/2018   Lab Results  Component Value Date   KPAFRELGTCHN 140.4 (H) 01/06/2018   LAMBDASER 14.7 01/06/2018   KAPLAMBRATIO 9.55 (H) 01/06/2018   Lab Results  Component Value Date   IGGSERUM 2,126 (H) 01/06/2018   IGGSERUM 2,214 (H) 01/06/2018   IGA 76 01/06/2018   IGA 75 01/06/2018   IGMSERUM 81 01/06/2018   IGMSERUM 81 01/06/2018    Lab Results  Component Value Date   TOTALPROTELP 7.3 01/06/2018   ALBUMINELP 3.9 01/06/2018   A1GS 0.2 01/06/2018   A2GS 0.6 01/06/2018   BETS 0.7 01/06/2018   BETA2SER 0.3 01/03/2015   GAMS 1.9 (H) 01/06/2018   MSPIKE 1.6 (H) 01/06/2018   SPEI * 01/03/2015     Chemistry      Component Value Date/Time   NA 133 01/27/2018 1414   NA 137 04/08/2017 1141   NA 138 10/29/2015 1029   K 3.6 01/27/2018 1414  K 3.4 04/08/2017 1141   K 3.9 10/29/2015 1029   CL 106 01/27/2018 1414   CL 102 04/08/2017 1141   CO2 29 01/27/2018 1414   CO2 30 04/08/2017 1141   CO2 27 10/29/2015 1029   BUN 13 01/27/2018 1414   BUN 16 04/08/2017 1141   BUN 13.8 10/29/2015 1029   CREATININE 1.30 (H) 01/27/2018 1414   CREATININE 1.3 (H) 04/08/2017 1141   CREATININE 1.1 10/29/2015 1029      Component Value Date/Time   CALCIUM 9.5 01/27/2018 1414   CALCIUM 9.3 04/08/2017 1141   CALCIUM 9.3 10/29/2015 1029   ALKPHOS 59 01/27/2018 1414   ALKPHOS 66 04/08/2017 1141   ALKPHOS 52 10/29/2015 1029   AST 29 01/27/2018 1414   AST 20 10/29/2015 1029   ALT 20 01/27/2018 1414   ALT 21 04/08/2017 1141   ALT 14 10/29/2015 1029   BILITOT 1.0 01/27/2018 1414   BILITOT 0.75 10/29/2015 1029      Impression and Plan: Mr. Sortino is a very pleasant 84 yo gentleman with IgG Kappa myeloma with history of stem cell transplant in 2006.  We we will start his treatment today.  Did see Dr. Laverta Baltimore at [facility].  Dr. Laverta Baltimore went ahead and agreed with our assessment.  We will treat him weekly for 3 weeks on and one-week off.  I will see him back in 1 month.  This will be the start of his second cycle of RVD.   Volanda Napoleon, MD 9/5/20193:23 PM

## 2018-01-28 LAB — KAPPA/LAMBDA LIGHT CHAINS
Kappa free light chain: 136.8 mg/L — ABNORMAL HIGH (ref 3.3–19.4)
Kappa, lambda light chain ratio: 9.5 — ABNORMAL HIGH (ref 0.26–1.65)
Lambda free light chains: 14.4 mg/L (ref 5.7–26.3)

## 2018-01-28 LAB — IGG, IGA, IGM
IgA: 71 mg/dL (ref 61–437)
IgG (Immunoglobin G), Serum: 2217 mg/dL — ABNORMAL HIGH (ref 700–1600)
IgM (Immunoglobulin M), Srm: 78 mg/dL (ref 15–143)

## 2018-02-01 LAB — PROTEIN ELECTROPHORESIS, SERUM, WITH REFLEX
A/G Ratio: 0.9 (ref 0.7–1.7)
Albumin ELP: 3.5 g/dL (ref 2.9–4.4)
Alpha-1-Globulin: 0.2 g/dL (ref 0.0–0.4)
Alpha-2-Globulin: 0.6 g/dL (ref 0.4–1.0)
Beta Globulin: 0.9 g/dL (ref 0.7–1.3)
Gamma Globulin: 2.1 g/dL — ABNORMAL HIGH (ref 0.4–1.8)
Globulin, Total: 3.7 g/dL (ref 2.2–3.9)
M-Spike, %: 1.7 g/dL — ABNORMAL HIGH
SPEP Interpretation: 0
Total Protein ELP: 7.2 g/dL (ref 6.0–8.5)

## 2018-02-01 LAB — IMMUNOFIXATION REFLEX, SERUM
IgA: 70 mg/dL (ref 61–437)
IgG (Immunoglobin G), Serum: 2110 mg/dL — ABNORMAL HIGH (ref 700–1600)
IgM (Immunoglobulin M), Srm: 78 mg/dL (ref 15–143)

## 2018-02-02 ENCOUNTER — Encounter (HOSPITAL_COMMUNITY)
Admission: RE | Admit: 2018-02-02 | Discharge: 2018-02-02 | Disposition: A | Discharge status: Home / Self Care | Payer: Medicare Other | Source: Ambulatory Visit | Attending: Hematology & Oncology | Admitting: Hematology & Oncology

## 2018-02-02 DIAGNOSIS — C9002 Multiple myeloma in relapse: Secondary | ICD-10-CM | POA: Diagnosis not present

## 2018-02-02 LAB — GLUCOSE, CAPILLARY: Glucose-Capillary: 88 mg/dL (ref 70–99)

## 2018-02-02 MED ORDER — FLUDEOXYGLUCOSE F - 18 (FDG) INJECTION
11.1000 | Freq: Once | INTRAVENOUS | Status: AC | PRN
  Administered 2018-02-02: 11.1 via INTRAVENOUS

## 2018-02-03 ENCOUNTER — Inpatient Hospital Stay: Payer: Medicare Other

## 2018-02-03 ENCOUNTER — Other Ambulatory Visit: Payer: Self-pay

## 2018-02-03 ENCOUNTER — Other Ambulatory Visit: Payer: Self-pay | Admitting: *Deleted

## 2018-02-03 VITALS — BP 137/72 | HR 52 | Temp 98.0°F | Resp 17

## 2018-02-03 DIAGNOSIS — Z5112 Encounter for antineoplastic immunotherapy: Secondary | ICD-10-CM | POA: Diagnosis not present

## 2018-02-03 DIAGNOSIS — C9002 Multiple myeloma in relapse: Secondary | ICD-10-CM

## 2018-02-03 DIAGNOSIS — Z9484 Stem cells transplant status: Secondary | ICD-10-CM | POA: Diagnosis not present

## 2018-02-03 DIAGNOSIS — D631 Anemia in chronic kidney disease: Secondary | ICD-10-CM | POA: Diagnosis not present

## 2018-02-03 DIAGNOSIS — Z79899 Other long term (current) drug therapy: Secondary | ICD-10-CM | POA: Diagnosis not present

## 2018-02-03 DIAGNOSIS — N189 Chronic kidney disease, unspecified: Secondary | ICD-10-CM | POA: Diagnosis not present

## 2018-02-03 LAB — CMP (CANCER CENTER ONLY)
ALT: 24 U/L (ref 10–47)
AST: 25 U/L (ref 11–38)
Albumin: 3.4 g/dL — ABNORMAL LOW (ref 3.5–5.0)
Alkaline Phosphatase: 61 U/L (ref 26–84)
Anion gap: 0 — ABNORMAL LOW (ref 5–15)
BUN: 14 mg/dL (ref 7–22)
CO2: 30 mmol/L (ref 18–33)
Calcium: 9.6 mg/dL (ref 8.0–10.3)
Chloride: 106 mmol/L (ref 98–108)
Creatinine: 1.2 mg/dL (ref 0.60–1.20)
Glucose, Bld: 103 mg/dL (ref 73–118)
Potassium: 4 mmol/L (ref 3.3–4.7)
Sodium: 135 mmol/L (ref 128–145)
Total Bilirubin: 0.8 mg/dL (ref 0.2–1.6)
Total Protein: 6.8 g/dL (ref 6.4–8.1)

## 2018-02-03 LAB — CBC WITH DIFFERENTIAL (CANCER CENTER ONLY)
Basophils Absolute: 0 10*3/uL (ref 0.0–0.1)
Basophils Relative: 0 %
Eosinophils Absolute: 0.1 10*3/uL (ref 0.0–0.5)
Eosinophils Relative: 2 %
HCT: 28.4 % — ABNORMAL LOW (ref 38.7–49.9)
Hemoglobin: 9.5 g/dL — ABNORMAL LOW (ref 13.0–17.1)
Lymphocytes Relative: 34 %
Lymphs Abs: 1.1 10*3/uL (ref 0.9–3.3)
MCH: 34.8 pg — ABNORMAL HIGH (ref 28.0–33.4)
MCHC: 33.5 g/dL (ref 32.0–35.9)
MCV: 104 fL — ABNORMAL HIGH (ref 82.0–98.0)
Monocytes Absolute: 0.2 10*3/uL (ref 0.1–0.9)
Monocytes Relative: 6 %
Neutro Abs: 2 10*3/uL (ref 1.5–6.5)
Neutrophils Relative %: 58 %
Platelet Count: 131 10*3/uL — ABNORMAL LOW (ref 145–400)
RBC: 2.73 MIL/uL — ABNORMAL LOW (ref 4.20–5.70)
RDW: 13.7 % (ref 11.1–15.7)
WBC Count: 3.3 10*3/uL — ABNORMAL LOW (ref 4.0–10.0)

## 2018-02-03 MED ORDER — PROCHLORPERAZINE MALEATE 10 MG PO TABS
10.0000 mg | ORAL_TABLET | Freq: Once | ORAL | Status: AC
  Administered 2018-02-03: 10 mg via ORAL

## 2018-02-03 MED ORDER — PROCHLORPERAZINE MALEATE 10 MG PO TABS
ORAL_TABLET | ORAL | Status: AC
  Filled 2018-02-03: qty 1

## 2018-02-03 MED ORDER — PROCHLORPERAZINE MALEATE 10 MG PO TABS: 10.0000 mg | ORAL_TABLET | Freq: Four times a day (QID) | ORAL | 0 refills | Status: DC | PRN

## 2018-02-03 MED ORDER — BORTEZOMIB CHEMO SQ INJECTION 3.5 MG (2.5MG/ML)
1.3000 mg/m2 | Freq: Once | INTRAMUSCULAR | Status: AC
  Administered 2018-02-03: 2.75 mg via SUBCUTANEOUS
  Filled 2018-02-03: qty 1.1

## 2018-02-03 NOTE — Patient Instructions (Signed)

## 2018-02-04 ENCOUNTER — Other Ambulatory Visit: Payer: Self-pay | Admitting: *Deleted

## 2018-02-04 MED ORDER — LENALIDOMIDE 25 MG PO CAPS: ORAL_CAPSULE | ORAL | 0 refills | Status: DC

## 2018-02-07 ENCOUNTER — Other Ambulatory Visit: Payer: Self-pay | Admitting: *Deleted

## 2018-02-07 MED ORDER — LENALIDOMIDE 25 MG PO CAPS: ORAL_CAPSULE | ORAL | 0 refills | Status: DC

## 2018-02-09 ENCOUNTER — Other Ambulatory Visit: Payer: Self-pay | Admitting: *Deleted

## 2018-02-09 DIAGNOSIS — C9002 Multiple myeloma in relapse: Secondary | ICD-10-CM

## 2018-02-10 ENCOUNTER — Other Ambulatory Visit: Payer: Self-pay

## 2018-02-10 ENCOUNTER — Inpatient Hospital Stay: Payer: Medicare Other

## 2018-02-10 VITALS — BP 152/72 | HR 64 | Temp 97.7°F | Resp 16

## 2018-02-10 DIAGNOSIS — C9002 Multiple myeloma in relapse: Secondary | ICD-10-CM | POA: Diagnosis not present

## 2018-02-10 DIAGNOSIS — Z5112 Encounter for antineoplastic immunotherapy: Secondary | ICD-10-CM | POA: Diagnosis not present

## 2018-02-10 DIAGNOSIS — N189 Chronic kidney disease, unspecified: Secondary | ICD-10-CM | POA: Diagnosis not present

## 2018-02-10 DIAGNOSIS — Z9484 Stem cells transplant status: Secondary | ICD-10-CM | POA: Diagnosis not present

## 2018-02-10 DIAGNOSIS — Z79899 Other long term (current) drug therapy: Secondary | ICD-10-CM | POA: Diagnosis not present

## 2018-02-10 DIAGNOSIS — D631 Anemia in chronic kidney disease: Secondary | ICD-10-CM | POA: Diagnosis not present

## 2018-02-10 LAB — CBC WITH DIFFERENTIAL (CANCER CENTER ONLY)
Basophils Absolute: 0 10*3/uL (ref 0.0–0.1)
Basophils Relative: 0 %
Eosinophils Absolute: 0 10*3/uL (ref 0.0–0.5)
Eosinophils Relative: 0 %
HCT: 28.2 % — ABNORMAL LOW (ref 38.7–49.9)
Hemoglobin: 9.3 g/dL — ABNORMAL LOW (ref 13.0–17.1)
Lymphocytes Relative: 13 %
Lymphs Abs: 0.6 10*3/uL — ABNORMAL LOW (ref 0.9–3.3)
MCH: 34.3 pg — ABNORMAL HIGH (ref 28.0–33.4)
MCHC: 33 g/dL (ref 32.0–35.9)
MCV: 104.1 fL — ABNORMAL HIGH (ref 82.0–98.0)
Monocytes Absolute: 0.2 10*3/uL (ref 0.1–0.9)
Monocytes Relative: 3 %
Neutro Abs: 4.3 10*3/uL (ref 1.5–6.5)
Neutrophils Relative %: 84 %
Platelet Count: 114 10*3/uL — ABNORMAL LOW (ref 145–400)
RBC: 2.71 MIL/uL — ABNORMAL LOW (ref 4.20–5.70)
RDW: 13.5 % (ref 11.1–15.7)
WBC Count: 5.1 10*3/uL (ref 4.0–10.0)

## 2018-02-10 LAB — CMP (CANCER CENTER ONLY)
ALT: 21 U/L (ref 10–47)
AST: 26 U/L (ref 11–38)
Albumin: 3.4 g/dL — ABNORMAL LOW (ref 3.5–5.0)
Alkaline Phosphatase: 65 U/L (ref 26–84)
Anion gap: 6 (ref 5–15)
BUN: 16 mg/dL (ref 7–22)
CO2: 28 mmol/L (ref 18–33)
Calcium: 9.6 mg/dL (ref 8.0–10.3)
Chloride: 103 mmol/L (ref 98–108)
Creatinine: 1.1 mg/dL (ref 0.60–1.20)
Glucose, Bld: 148 mg/dL — ABNORMAL HIGH (ref 73–118)
Potassium: 4.3 mmol/L (ref 3.3–4.7)
Sodium: 137 mmol/L (ref 128–145)
Total Bilirubin: 0.8 mg/dL (ref 0.2–1.6)
Total Protein: 6.9 g/dL (ref 6.4–8.1)

## 2018-02-10 MED ORDER — PROCHLORPERAZINE MALEATE 10 MG PO TABS
ORAL_TABLET | ORAL | Status: AC
  Filled 2018-02-10: qty 1

## 2018-02-10 MED ORDER — BORTEZOMIB CHEMO SQ INJECTION 3.5 MG (2.5MG/ML)
1.3000 mg/m2 | Freq: Once | INTRAMUSCULAR | Status: AC
  Administered 2018-02-10: 2.75 mg via SUBCUTANEOUS
  Filled 2018-02-10: qty 1.1

## 2018-02-10 MED ORDER — PROCHLORPERAZINE MALEATE 10 MG PO TABS
10.0000 mg | ORAL_TABLET | Freq: Once | ORAL | Status: AC
  Administered 2018-02-10: 10 mg via ORAL

## 2018-02-10 NOTE — Patient Instructions (Signed)
[facility] Discharge Instructions for Patients Receiving Chemotherapy  Today you received the following chemotherapy agents Velcade To help prevent nausea and vomiting after your treatment, we encourage you to take your nausea medication as prescribed.   If you develop nausea and vomiting that is not controlled by your nausea medication, call the clinic.   BELOW ARE SYMPTOMS THAT SHOULD BE REPORTED IMMEDIATELY:  *FEVER GREATER THAN 100.5 F  *CHILLS WITH OR WITHOUT FEVER  NAUSEA AND VOMITING THAT IS NOT CONTROLLED WITH YOUR NAUSEA MEDICATION  *UNUSUAL SHORTNESS OF BREATH  *UNUSUAL BRUISING OR BLEEDING  TENDERNESS IN MOUTH AND THROAT WITH OR WITHOUT PRESENCE OF ULCERS  *URINARY PROBLEMS  *BOWEL PROBLEMS  UNUSUAL RASH Items with * indicate a potential emergency and should be followed up as soon as possible.  Feel free to call the clinic should you have any questions or concerns. The clinic phone number is [phone].  Please show the CHEMO ALERT CARD at check-in to the Emergency Department and triage nurse.   

## 2018-02-17 ENCOUNTER — Inpatient Hospital Stay: Payer: TRICARE For Life (TFL)

## 2018-02-17 ENCOUNTER — Ambulatory Visit: Payer: TRICARE For Life (TFL) | Admitting: Hematology & Oncology

## 2018-02-17 ENCOUNTER — Other Ambulatory Visit: Payer: TRICARE For Life (TFL)

## 2018-02-24 ENCOUNTER — Inpatient Hospital Stay: Payer: Medicare Other

## 2018-02-24 ENCOUNTER — Other Ambulatory Visit: Payer: TRICARE For Life (TFL)

## 2018-02-24 ENCOUNTER — Inpatient Hospital Stay (HOSPITAL_BASED_OUTPATIENT_CLINIC_OR_DEPARTMENT_OTHER): Payer: Medicare Other | Admitting: Hematology & Oncology

## 2018-02-24 ENCOUNTER — Encounter: Payer: Self-pay | Admitting: Hematology & Oncology

## 2018-02-24 ENCOUNTER — Inpatient Hospital Stay: Payer: Medicare Other | Attending: Hematology & Oncology

## 2018-02-24 ENCOUNTER — Other Ambulatory Visit: Payer: Self-pay

## 2018-02-24 VITALS — BP 172/83 | HR 61 | Temp 98.2°F | Resp 16 | Wt 217.0 lb

## 2018-02-24 DIAGNOSIS — Z79899 Other long term (current) drug therapy: Secondary | ICD-10-CM | POA: Insufficient documentation

## 2018-02-24 DIAGNOSIS — C9002 Multiple myeloma in relapse: Secondary | ICD-10-CM

## 2018-02-24 DIAGNOSIS — Z9484 Stem cells transplant status: Secondary | ICD-10-CM

## 2018-02-24 DIAGNOSIS — Z7982 Long term (current) use of aspirin: Secondary | ICD-10-CM | POA: Diagnosis not present

## 2018-02-24 DIAGNOSIS — D649 Anemia, unspecified: Secondary | ICD-10-CM | POA: Diagnosis not present

## 2018-02-24 DIAGNOSIS — Z5112 Encounter for antineoplastic immunotherapy: Secondary | ICD-10-CM | POA: Insufficient documentation

## 2018-02-24 LAB — CMP (CANCER CENTER ONLY)
ALT: 22 U/L (ref 10–47)
AST: 27 U/L (ref 11–38)
Albumin: 3.2 g/dL — ABNORMAL LOW (ref 3.5–5.0)
Alkaline Phosphatase: 63 U/L (ref 26–84)
Anion gap: 0 — ABNORMAL LOW (ref 5–15)
BUN: 20 mg/dL (ref 7–22)
CO2: 26 mmol/L (ref 18–33)
Calcium: 9.1 mg/dL (ref 8.0–10.3)
Chloride: 109 mmol/L — ABNORMAL HIGH (ref 98–108)
Creatinine: 1.3 mg/dL — ABNORMAL HIGH (ref 0.60–1.20)
Glucose, Bld: 98 mg/dL (ref 73–118)
Potassium: 3.6 mmol/L (ref 3.3–4.7)
Sodium: 135 mmol/L (ref 128–145)
Total Bilirubin: 0.9 mg/dL (ref 0.2–1.6)
Total Protein: 6.6 g/dL (ref 6.4–8.1)

## 2018-02-24 LAB — CBC WITH DIFFERENTIAL (CANCER CENTER ONLY)
Basophils Absolute: 0 10*3/uL (ref 0.0–0.1)
Basophils Relative: 1 %
Eosinophils Absolute: 0.1 10*3/uL (ref 0.0–0.5)
Eosinophils Relative: 2 %
HCT: 25.4 % — ABNORMAL LOW (ref 38.7–49.9)
Hemoglobin: 8.5 g/dL — ABNORMAL LOW (ref 13.0–17.1)
Lymphocytes Relative: 38 %
Lymphs Abs: 1.3 10*3/uL (ref 0.9–3.3)
MCH: 35 pg — ABNORMAL HIGH (ref 28.0–33.4)
MCHC: 33.5 g/dL (ref 32.0–35.9)
MCV: 104.5 fL — ABNORMAL HIGH (ref 82.0–98.0)
Monocytes Absolute: 0.3 10*3/uL (ref 0.1–0.9)
Monocytes Relative: 9 %
Neutro Abs: 1.8 10*3/uL (ref 1.5–6.5)
Neutrophils Relative %: 50 %
Platelet Count: 141 10*3/uL — ABNORMAL LOW (ref 145–400)
RBC: 2.43 MIL/uL — ABNORMAL LOW (ref 4.20–5.70)
RDW: 13.8 % (ref 11.1–15.7)
WBC Count: 3.4 10*3/uL — ABNORMAL LOW (ref 4.0–10.0)

## 2018-02-24 MED ORDER — BORTEZOMIB CHEMO SQ INJECTION 3.5 MG (2.5MG/ML)
1.3000 mg/m2 | Freq: Once | INTRAMUSCULAR | Status: AC
  Administered 2018-02-24: 2.75 mg via SUBCUTANEOUS
  Filled 2018-02-24: qty 1.1

## 2018-02-24 MED ORDER — PROCHLORPERAZINE MALEATE 10 MG PO TABS: 10.0000 mg | ORAL_TABLET | Freq: Once | ORAL | Status: DC

## 2018-02-24 NOTE — Progress Notes (Signed)
Hematology and Oncology Follow Up Visit  Robert [name] [id number] [birth year] 84 y.o. 02/24/2018   Principle Diagnosis:  IgG Kappa Myeloma-Relapsed  Current Therapy:  RVD  --  S/p cycle #1 Zometa 4 mg IV q. 4 months - next dose 0n 03/2018   Interim History:  Robert [name] is here today for follow-up.  He started his first cycle of RVD in September.  He tolerated this pretty well.  He had no problems with nausea or vomiting.  He is gained quite a bit of weight.  This might be from the Decadron that he takes once a week.  His hemoglobin is quite low.  I am a little bit troubled by this.  I am not sure as to why it would be 8.5.  He is not bleeding.  He has not noted any melena or hematochezia.  He has had no cough or shortness of breath.  There is been no rashes.  He has had no pruritus.  He has had no leg swelling.  Overall, his performance status is ECOG 1.   Medications:  Allergies as of 02/24/2018   No Known Allergies     Medication List        Accurate as of 02/24/18  2:15 PM. Always use your most recent med list.          aspirin EC 325 MG tablet Take 1 tablet (325 mg total) by mouth daily.   bimatoprost 0.01 % Soln Commonly known as:  LUMIGAN Place 1 drop into both eyes at bedtime.   CALTRATE 600+D PLUS PO Take 1 tablet by mouth daily.   carvedilol 25 MG tablet Commonly known as:  COREG Take 25 mg by mouth 2 (two) times daily with a meal.   cloNIDine 0.1 MG tablet Commonly known as:  CATAPRES Take 0.1 mg by mouth 2 (two) times daily as needed (elevated blood pressure >190/80).   [employer] VITAMIN B12 2000 MCG tablet Generic drug:  cyanocobalamin Take 2,000 mcg by mouth at bedtime.   dexamethasone 4 MG tablet Commonly known as:  DECADRON Take 5 tablets (20 mg total) by mouth once a week.   famciclovir 250 MG tablet Commonly known as:  FAMVIR Take 1 tablet (250 mg total) by mouth daily.   fluticasone 50 MCG/ACT nasal spray Commonly known as:  FLONASE Place 2  sprays into both nostrils 2 (two) times daily as needed (seasonal allergies).   hydrochlorothiazide 25 MG tablet Commonly known as:  HYDRODIURIL Take 25 mg by mouth daily.   lenalidomide 25 MG capsule Commonly known as:  REVLIMID Take 1 capsule daily at bedtime for 21 days on then 7 days off. [id number]   losartan 100 MG tablet Commonly known as:  COZAAR Take 100 mg by mouth daily.   multivitamin with minerals Tabs tablet Take 1 tablet by mouth daily.   potassium chloride SA 20 MEQ tablet Commonly known as:  K-DUR,KLOR-CON Take 20 mEq by mouth daily.   pravastatin 20 MG tablet Commonly known as:  PRAVACHOL Take 20 mg by mouth at bedtime.   PROAIR HFA 108 (90 Base) MCG/ACT inhaler Generic drug:  albuterol Inhale 2 puffs into the lungs every 6 (six) hours as needed for wheezing or shortness of breath.   prochlorperazine 10 MG tablet Commonly known as:  COMPAZINE Take 1 tablet (10 mg total) by mouth every 6 (six) hours as needed for nausea or vomiting.   SYSTANE OP Place 1 drop into both eyes daily as needed (dry eyes).  tadalafil 20 MG tablet Commonly known as:  ADCIRCA/CIALIS Take 20 mg by mouth daily as needed for erectile dysfunction.   Vitamin D3 1000 units Caps Take 1,000 Units by mouth daily.   ZOMETA 4 MG/100ML IVPB Generic drug:  Zoledronic Acid Inject 4 mg into the vein every 4 (four) months. Administered by Dr. Jonette Eva at [facility] - last infusion mid July 2019       Allergies: No Known Allergies  Past Medical History, Surgical history, Social history, and Family History were reviewed and updated.  Review of Systems: Review of Systems  Constitutional: Negative.   HENT: Negative.   Eyes: Negative.   Respiratory: Negative.  Negative for cough.   Cardiovascular: Negative.   Gastrointestinal: Negative.   Genitourinary: Negative.   Musculoskeletal: Negative.   Skin: Negative.   Neurological: Negative.   Endo/Heme/Allergies:  Negative.   Psychiatric/Behavioral: Negative.      Physical Exam:  weight is 217 lb (98.4 kg). His oral temperature is 98.2 F (36.8 C). His blood pressure is 172/83 (abnormal) and his pulse is 61. His respiration is 16 and oxygen saturation is 100%.   Wt Readings from Last 3 Encounters:  02/24/18 217 lb (98.4 kg)  01/27/18 210 lb (95.3 kg)  01/06/18 211 lb (95.7 kg)    Physical Exam  Constitutional: He is oriented to person, place, and time.  HENT:  Head: Normocephalic and atraumatic.  Mouth/Throat: Oropharynx is clear and moist.  Eyes: Pupils are equal, round, and reactive to light. EOM are normal.  Neck: Normal range of motion.  Cardiovascular: Normal rate, regular rhythm and normal heart sounds.  Pulmonary/Chest: Effort normal and breath sounds normal.  Abdominal: Soft. Bowel sounds are normal.  Musculoskeletal: Normal range of motion. He exhibits no edema, tenderness or deformity.  Lymphadenopathy:    He has no cervical adenopathy.  Neurological: He is alert and oriented to person, place, and time.  Skin: Skin is warm and dry. No rash noted. No erythema.  Psychiatric: He has a normal mood and affect. His behavior is normal. Judgment and thought content normal.  Vitals reviewed.   Lab Results  Component Value Date   WBC 3.4 (L) 02/24/2018   HGB 8.5 (L) 02/24/2018   HCT 25.4 (L) 02/24/2018   MCV 104.5 (H) 02/24/2018   PLT 141 (L) 02/24/2018   Lab Results  Component Value Date   FERRITIN 383 (H) 01/06/2018   IRON 91 01/06/2018   TIBC 265 01/06/2018   UIBC 175 01/06/2018   IRONPCTSAT 34 (L) 01/06/2018   Lab Results  Component Value Date   RETICCTPCT 1.0 01/06/2018   RBC 2.43 (L) 02/24/2018   Lab Results  Component Value Date   KPAFRELGTCHN 136.8 (H) 01/27/2018   LAMBDASER 14.4 01/27/2018   KAPLAMBRATIO 9.50 (H) 01/27/2018   Lab Results  Component Value Date   IGGSERUM 2,217 (H) 01/27/2018   IGGSERUM 2,110 (H) 01/27/2018   IGA 71 01/27/2018   IGA 70  01/27/2018   IGMSERUM 78 01/27/2018   IGMSERUM 78 01/27/2018   Lab Results  Component Value Date   TOTALPROTELP 7.2 01/27/2018   ALBUMINELP 3.5 01/27/2018   A1GS 0.2 01/27/2018   A2GS 0.6 01/27/2018   BETS 0.9 01/27/2018   BETA2SER 0.3 01/03/2015   GAMS 2.1 (H) 01/27/2018   MSPIKE 1.7 (H) 01/27/2018   SPEI * 01/03/2015     Chemistry      Component Value Date/Time   NA 135 02/24/2018 1327   NA 137 04/08/2017 1141  NA 138 10/29/2015 1029   K 3.6 02/24/2018 1327   K 3.4 04/08/2017 1141   K 3.9 10/29/2015 1029   CL 109 (H) 02/24/2018 1327   CL 102 04/08/2017 1141   CO2 26 02/24/2018 1327   CO2 30 04/08/2017 1141   CO2 27 10/29/2015 1029   BUN 20 02/24/2018 1327   BUN 16 04/08/2017 1141   BUN 13.8 10/29/2015 1029   CREATININE 1.30 (H) 02/24/2018 1327   CREATININE 1.3 (H) 04/08/2017 1141   CREATININE 1.1 10/29/2015 1029      Component Value Date/Time   CALCIUM 9.1 02/24/2018 1327   CALCIUM 9.3 04/08/2017 1141   CALCIUM 9.3 10/29/2015 1029   ALKPHOS 63 02/24/2018 1327   ALKPHOS 66 04/08/2017 1141   ALKPHOS 52 10/29/2015 1029   AST 27 02/24/2018 1327   AST 20 10/29/2015 1029   ALT 22 02/24/2018 1327   ALT 21 04/08/2017 1141   ALT 14 10/29/2015 1029   BILITOT 0.9 02/24/2018 1327   BILITOT 0.75 10/29/2015 1029      Impression and Plan: Robert [name] is a very pleasant 84 yo gentleman with IgG Kappa myeloma with history of stem cell transplant in 2006.  Again, we will have to watch his hemoglobin very closely.  I do not think that there is any obvious myelodysplasia.  I would have thought the bone marrow would have showed [place] this.  We will proceed with his second cycle of RVD.  We will have to check his blood counts weekly.  Volanda Napoleon, MD 10/3/20192:15 PM

## 2018-02-24 NOTE — Patient Instructions (Signed)
Cancer Center Discharge Instructions for Patients Receiving Chemotherapy  Today you received the following chemotherapy agents Velcade To help prevent nausea and vomiting after your treatment, we encourage you to take your nausea medication as prescribed.   If you develop nausea and vomiting that is not controlled by your nausea medication, call the clinic.   BELOW ARE SYMPTOMS THAT SHOULD BE REPORTED IMMEDIATELY:  *FEVER GREATER THAN 100.5 F  *CHILLS WITH OR WITHOUT FEVER  NAUSEA AND VOMITING THAT IS NOT CONTROLLED WITH YOUR NAUSEA MEDICATION  *UNUSUAL SHORTNESS OF BREATH  *UNUSUAL BRUISING OR BLEEDING  TENDERNESS IN MOUTH AND THROAT WITH OR WITHOUT PRESENCE OF ULCERS  *URINARY PROBLEMS  *BOWEL PROBLEMS  UNUSUAL RASH Items with * indicate a potential emergency and should be followed up as soon as possible.  Feel free to call the clinic should you have any questions or concerns. The clinic phone number is [phone].  Please show the CHEMO ALERT CARD at check-in to the Emergency Department and triage nurse.   

## 2018-02-25 LAB — IGG, IGA, IGM
IgA: 61 mg/dL (ref 61–437)
IgG (Immunoglobin G), Serum: 853 mg/dL (ref 700–1600)
IgM (Immunoglobulin M), Srm: 78 mg/dL (ref 15–143)

## 2018-02-25 LAB — KAPPA/LAMBDA LIGHT CHAINS
Kappa free light chain: 113.6 mg/L — ABNORMAL HIGH (ref 3.3–19.4)
Kappa, lambda light chain ratio: 5.65 — ABNORMAL HIGH (ref 0.26–1.65)
Lambda free light chains: 20.1 mg/L (ref 5.7–26.3)

## 2018-02-25 LAB — IRON AND TIBC
Iron: 57 ug/dL (ref 42–163)
Saturation Ratios: 22 % — ABNORMAL LOW (ref 42–163)
TIBC: 255 ug/dL (ref 202–409)
UIBC: 198 ug/dL

## 2018-02-25 LAB — FERRITIN: Ferritin: 353 ng/mL — ABNORMAL HIGH (ref 24–336)

## 2018-02-28 LAB — PROTEIN ELECTROPHORESIS, SERUM, WITH REFLEX
A/G Ratio: 1.1 (ref 0.7–1.7)
Albumin ELP: 3.2 g/dL (ref 2.9–4.4)
Alpha-1-Globulin: 0.2 g/dL (ref 0.0–0.4)
Alpha-2-Globulin: 0.6 g/dL (ref 0.4–1.0)
Beta Globulin: 0.8 g/dL (ref 0.7–1.3)
Gamma Globulin: 1.4 g/dL (ref 0.4–1.8)
Globulin, Total: 3 g/dL (ref 2.2–3.9)
M-Spike, %: 1.1 g/dL — ABNORMAL HIGH
SPEP Interpretation: 0
Total Protein ELP: 6.2 g/dL (ref 6.0–8.5)

## 2018-02-28 LAB — IMMUNOFIXATION REFLEX, SERUM
IgA: 59 mg/dL — ABNORMAL LOW (ref 61–437)
IgG (Immunoglobin G), Serum: 1398 mg/dL (ref 700–1600)
IgM (Immunoglobulin M), Srm: 75 mg/dL (ref 15–143)

## 2018-03-01 ENCOUNTER — Other Ambulatory Visit: Payer: Self-pay | Admitting: *Deleted

## 2018-03-01 MED ORDER — LENALIDOMIDE 25 MG PO CAPS: ORAL_CAPSULE | ORAL | 0 refills | Status: DC

## 2018-03-03 ENCOUNTER — Inpatient Hospital Stay: Payer: Medicare Other

## 2018-03-03 ENCOUNTER — Inpatient Hospital Stay (HOSPITAL_BASED_OUTPATIENT_CLINIC_OR_DEPARTMENT_OTHER): Payer: Medicare Other | Admitting: Hematology & Oncology

## 2018-03-03 VITALS — BP 165/69 | HR 64 | Temp 97.9°F | Resp 18

## 2018-03-03 DIAGNOSIS — Z9484 Stem cells transplant status: Secondary | ICD-10-CM | POA: Diagnosis not present

## 2018-03-03 DIAGNOSIS — Z79899 Other long term (current) drug therapy: Secondary | ICD-10-CM

## 2018-03-03 DIAGNOSIS — Z7982 Long term (current) use of aspirin: Secondary | ICD-10-CM

## 2018-03-03 DIAGNOSIS — C9000 Multiple myeloma not having achieved remission: Secondary | ICD-10-CM

## 2018-03-03 DIAGNOSIS — D649 Anemia, unspecified: Secondary | ICD-10-CM

## 2018-03-03 DIAGNOSIS — C9002 Multiple myeloma in relapse: Secondary | ICD-10-CM | POA: Diagnosis not present

## 2018-03-03 DIAGNOSIS — Z5112 Encounter for antineoplastic immunotherapy: Secondary | ICD-10-CM | POA: Diagnosis not present

## 2018-03-03 DIAGNOSIS — M899 Disorder of bone, unspecified: Secondary | ICD-10-CM

## 2018-03-03 LAB — CBC WITH DIFFERENTIAL (CANCER CENTER ONLY)
Abs Immature Granulocytes: 0.04 10*3/uL (ref 0.00–0.07)
Basophils Absolute: 0 10*3/uL (ref 0.0–0.1)
Basophils Relative: 0 %
Eosinophils Absolute: 0 10*3/uL (ref 0.0–0.5)
Eosinophils Relative: 1 %
HCT: 28.2 % — ABNORMAL LOW (ref 39.0–52.0)
Hemoglobin: 9.1 g/dL — ABNORMAL LOW (ref 13.0–17.0)
Immature Granulocytes: 1 %
Lymphocytes Relative: 29 %
Lymphs Abs: 1 10*3/uL (ref 0.7–4.0)
MCH: 33.5 pg (ref 26.0–34.0)
MCHC: 32.3 g/dL (ref 30.0–36.0)
MCV: 103.7 fL — ABNORMAL HIGH (ref 80.0–100.0)
Monocytes Absolute: 0.2 10*3/uL (ref 0.1–1.0)
Monocytes Relative: 6 %
Neutro Abs: 2.1 10*3/uL (ref 1.7–7.7)
Neutrophils Relative %: 63 %
Platelet Count: 108 10*3/uL — ABNORMAL LOW (ref 150–400)
RBC: 2.72 MIL/uL — ABNORMAL LOW (ref 4.22–5.81)
RDW: 14.1 % (ref 11.5–15.5)
WBC Count: 3.4 10*3/uL — ABNORMAL LOW (ref 4.0–10.5)
nRBC: 0 % (ref 0.0–0.2)

## 2018-03-03 LAB — CMP (CANCER CENTER ONLY)
ALT: 21 U/L (ref 10–47)
AST: 22 U/L (ref 11–38)
Albumin: 3.5 g/dL (ref 3.5–5.0)
Alkaline Phosphatase: 65 U/L (ref 26–84)
Anion gap: 1 — ABNORMAL LOW (ref 5–15)
BUN: 19 mg/dL (ref 7–22)
CO2: 29 mmol/L (ref 18–33)
Calcium: 9.6 mg/dL (ref 8.0–10.3)
Chloride: 104 mmol/L (ref 98–108)
Creatinine: 1.5 mg/dL — ABNORMAL HIGH (ref 0.60–1.20)
Glucose, Bld: 92 mg/dL (ref 73–118)
Potassium: 4.5 mmol/L (ref 3.3–4.7)
Sodium: 134 mmol/L (ref 128–145)
Total Bilirubin: 1 mg/dL (ref 0.2–1.6)
Total Protein: 6.6 g/dL (ref 6.4–8.1)

## 2018-03-03 MED ORDER — BORTEZOMIB CHEMO SQ INJECTION 3.5 MG (2.5MG/ML)
1.3000 mg/m2 | Freq: Once | INTRAMUSCULAR | Status: AC
  Administered 2018-03-03: 2.75 mg via SUBCUTANEOUS
  Filled 2018-03-03: qty 1.1

## 2018-03-03 NOTE — Progress Notes (Signed)
Hematology and Oncology Follow Up Visit  Robert [name] [id number] 1934-01-08 84 y.o. 03/03/2018   Principle Diagnosis:  IgG Kappa Myeloma-Relapsed  Current Therapy:  RVD  --  S/p cycle #1 Zometa 4 mg IV q. 4 months - next dose 0n 03/2018   Interim History:  Robert [name] is here today for treatment.  However, he has had an issue since we last saw him.  Apparently, 2 days ago while at home he passed out.  He was sitting at the kitchen table.  He was with his wife.  He just "slumped over."  She called 911.  EMS came over.  She was doing some CPR.  I do not think he ever lost his heart rate.  Is not clear as to how long he had passed out for.  He was not taken to the ER.  He had an evaluation at home.  His blood sugar was okay.  His blood pressure was a little bit low for him.  He was not aware of passing out.  He had no diaphoresis.  He had no shortness of breath.  He has not had any change in his medications.  He is on the Revlimid.  He is done okay with this so far.  His M spike is already come down which is nice to see.  His light chain went from 1.7 g/dL down to 1.1 g/dL.  His IgG level went from 2200 mg/dL down to 873 mg/dL.  He has had no leg swelling.  He has had no rashes.  There is no bowel or bladder incontinence.    He said that he had a similar episode back in August.  He was admitted for this.  The work-up was unremarkable.  It was felt that he had postprandial syncope.    Overall, his performance status is ECOG 1.   Medications:  Allergies as of 03/03/2018   No Known Allergies     Medication List        Accurate as of 03/03/18  2:42 PM. Always use your most recent med list.          aspirin EC 325 MG tablet Take 1 tablet (325 mg total) by mouth daily.   bimatoprost 0.01 % Soln Commonly known as:  LUMIGAN Place 1 drop into both eyes at bedtime.   CALTRATE 600+D PLUS PO Take 1 tablet by mouth daily.   carvedilol 25 MG tablet Commonly known as:  COREG Take  25 mg by mouth 2 (two) times daily with a meal.   cloNIDine 0.1 MG tablet Commonly known as:  CATAPRES Take 0.1 mg by mouth 2 (two) times daily as needed (elevated blood pressure >190/80).   [employer] VITAMIN B12 2000 MCG tablet Generic drug:  cyanocobalamin Take 2,000 mcg by mouth at bedtime.   dexamethasone 4 MG tablet Commonly known as:  DECADRON Take 5 tablets (20 mg total) by mouth once a week.   famciclovir 250 MG tablet Commonly known as:  FAMVIR Take 1 tablet (250 mg total) by mouth daily.   fluticasone 50 MCG/ACT nasal spray Commonly known as:  FLONASE Place 2 sprays into both nostrils 2 (two) times daily as needed (seasonal allergies).   hydrochlorothiazide 25 MG tablet Commonly known as:  HYDRODIURIL Take 25 mg by mouth daily.   lenalidomide 25 MG capsule Commonly known as:  REVLIMID Take 1 capsule daily at bedtime for 21 days on then 7 days off. [id number]   losartan 100 MG tablet Commonly known as:  COZAAR Take 100 mg by mouth daily.   multivitamin with minerals Tabs tablet Take 1 tablet by mouth daily.   potassium chloride SA 20 MEQ tablet Commonly known as:  K-DUR,KLOR-CON Take 20 mEq by mouth daily.   pravastatin 20 MG tablet Commonly known as:  PRAVACHOL Take 20 mg by mouth at bedtime.   PROAIR HFA 108 (90 Base) MCG/ACT inhaler Generic drug:  albuterol Inhale 2 puffs into the lungs every 6 (six) hours as needed for wheezing or shortness of breath.   prochlorperazine 10 MG tablet Commonly known as:  COMPAZINE Take 1 tablet (10 mg total) by mouth every 6 (six) hours as needed for nausea or vomiting.   SYSTANE OP Place 1 drop into both eyes daily as needed (dry eyes).   tadalafil 20 MG tablet Commonly known as:  ADCIRCA/CIALIS Take 20 mg by mouth daily as needed for erectile dysfunction.   Vitamin D3 1000 units Caps Take 1,000 Units by mouth daily.   ZOMETA 4 MG/100ML IVPB Generic drug:  Zoledronic Acid Inject 4 mg into the vein every 4  (four) months. Administered by Dr. Jonette Eva at [facility] - last infusion mid July 2019       Allergies: No Known Allergies  Past Medical History, Surgical history, Social history, and Family History were reviewed and updated.  Review of Systems: Review of Systems  Constitutional: Negative.   HENT: Negative.   Eyes: Negative.   Respiratory: Negative.  Negative for cough.   Cardiovascular: Negative.   Gastrointestinal: Negative.   Genitourinary: Negative.   Musculoskeletal: Negative.   Skin: Negative.   Neurological: Negative.   Endo/Heme/Allergies: Negative.   Psychiatric/Behavioral: Negative.      Physical Exam:  vitals were not taken for this visit.   Wt Readings from Last 3 Encounters:  02/24/18 217 lb (98.4 kg)  01/27/18 210 lb (95.3 kg)  01/06/18 211 lb (95.7 kg)    Physical Exam  Constitutional: He is oriented to person, place, and time.  HENT:  Head: Normocephalic and atraumatic.  Mouth/Throat: Oropharynx is clear and moist.  Eyes: Pupils are equal, round, and reactive to light. EOM are normal.  Neck: Normal range of motion.  Cardiovascular: Normal rate, regular rhythm and normal heart sounds.  Pulmonary/Chest: Effort normal and breath sounds normal.  Abdominal: Soft. Bowel sounds are normal.  Musculoskeletal: Normal range of motion. He exhibits no edema, tenderness or deformity.  Lymphadenopathy:    He has no cervical adenopathy.  Neurological: He is alert and oriented to person, place, and time.  Skin: Skin is warm and dry. No rash noted. No erythema.  Psychiatric: He has a normal mood and affect. His behavior is normal. Judgment and thought content normal.  Vitals reviewed.   Lab Results  Component Value Date   WBC 3.4 (L) 03/03/2018   HGB 9.1 (L) 03/03/2018   HCT 28.2 (L) 03/03/2018   MCV 103.7 (H) 03/03/2018   PLT 108 (L) 03/03/2018   Lab Results  Component Value Date   FERRITIN 353 (H) 02/24/2018   IRON 57 02/24/2018   TIBC  255 02/24/2018   UIBC 198 02/24/2018   IRONPCTSAT 22 (L) 02/24/2018   Lab Results  Component Value Date   RETICCTPCT 1.0 01/06/2018   RBC 2.72 (L) 03/03/2018   Lab Results  Component Value Date   KPAFRELGTCHN 113.6 (H) 02/24/2018   LAMBDASER 20.1 02/24/2018   KAPLAMBRATIO 5.65 (H) 02/24/2018   Lab Results  Component Value Date   IGGSERUM 853 02/24/2018  IGGSERUM 1,398 02/24/2018   IGA 61 02/24/2018   IGA 59 (L) 02/24/2018   IGMSERUM 78 02/24/2018   IGMSERUM 75 02/24/2018   Lab Results  Component Value Date   TOTALPROTELP 6.2 02/24/2018   ALBUMINELP 3.2 02/24/2018   A1GS 0.2 02/24/2018   A2GS 0.6 02/24/2018   BETS 0.8 02/24/2018   BETA2SER 0.3 01/03/2015   GAMS 1.4 02/24/2018   MSPIKE 1.1 (H) 02/24/2018   SPEI * 01/03/2015     Chemistry      Component Value Date/Time   NA 134 03/03/2018 1314   NA 137 04/08/2017 1141   NA 138 10/29/2015 1029   K 4.5 03/03/2018 1314   K 3.4 04/08/2017 1141   K 3.9 10/29/2015 1029   CL 104 03/03/2018 1314   CL 102 04/08/2017 1141   CO2 29 03/03/2018 1314   CO2 30 04/08/2017 1141   CO2 27 10/29/2015 1029   BUN 19 03/03/2018 1314   BUN 16 04/08/2017 1141   BUN 13.8 10/29/2015 1029   CREATININE 1.50 (H) 03/03/2018 1314   CREATININE 1.3 (H) 04/08/2017 1141   CREATININE 1.1 10/29/2015 1029      Component Value Date/Time   CALCIUM 9.6 03/03/2018 1314   CALCIUM 9.3 04/08/2017 1141   CALCIUM 9.3 10/29/2015 1029   ALKPHOS 65 03/03/2018 1314   ALKPHOS 66 04/08/2017 1141   ALKPHOS 52 10/29/2015 1029   AST 22 03/03/2018 1314   AST 20 10/29/2015 1029   ALT 21 03/03/2018 1314   ALT 21 04/08/2017 1141   ALT 14 10/29/2015 1029   BILITOT 1.0 03/03/2018 1314   BILITOT 0.75 10/29/2015 1029      Impression and Plan: Mr. Dube is a very pleasant 84 yo gentleman with IgG Kappa myeloma with history of stem cell transplant in 2006.  I am not sure why he has syncopal episode.  His exam was pretty much unremarkable today.  It might be  postprandial syncope.  I just cannot imagine this being related to his protocol for the myeloma.  He is a little bit anemic but I do not think this would account for him to have a syncopal episode.  If this happens again, he will have to clearly be evaluated and have his treatment held.    Volanda Napoleon, MD 10/10/20192:42 PM

## 2018-03-07 ENCOUNTER — Other Ambulatory Visit: Payer: Self-pay | Admitting: *Deleted

## 2018-03-07 DIAGNOSIS — C9002 Multiple myeloma in relapse: Secondary | ICD-10-CM

## 2018-03-07 MED ORDER — LENALIDOMIDE 25 MG PO CAPS: ORAL_CAPSULE | ORAL | 0 refills | Status: DC

## 2018-03-10 ENCOUNTER — Inpatient Hospital Stay: Payer: Medicare Other

## 2018-03-10 ENCOUNTER — Other Ambulatory Visit: Payer: Self-pay

## 2018-03-10 ENCOUNTER — Other Ambulatory Visit: Payer: Self-pay | Admitting: *Deleted

## 2018-03-10 DIAGNOSIS — C9002 Multiple myeloma in relapse: Secondary | ICD-10-CM

## 2018-03-10 DIAGNOSIS — Z5112 Encounter for antineoplastic immunotherapy: Secondary | ICD-10-CM | POA: Diagnosis not present

## 2018-03-10 DIAGNOSIS — Z79899 Other long term (current) drug therapy: Secondary | ICD-10-CM | POA: Diagnosis not present

## 2018-03-10 DIAGNOSIS — Z7982 Long term (current) use of aspirin: Secondary | ICD-10-CM | POA: Diagnosis not present

## 2018-03-10 DIAGNOSIS — D649 Anemia, unspecified: Secondary | ICD-10-CM | POA: Diagnosis not present

## 2018-03-10 DIAGNOSIS — Z9484 Stem cells transplant status: Secondary | ICD-10-CM | POA: Diagnosis not present

## 2018-03-10 LAB — CBC WITH DIFFERENTIAL (CANCER CENTER ONLY)
Abs Immature Granulocytes: 0.01 10*3/uL (ref 0.00–0.07)
Basophils Absolute: 0 10*3/uL (ref 0.0–0.1)
Basophils Relative: 0 %
Eosinophils Absolute: 0.1 10*3/uL (ref 0.0–0.5)
Eosinophils Relative: 4 %
HCT: 26.4 % — ABNORMAL LOW (ref 39.0–52.0)
Hemoglobin: 8.6 g/dL — ABNORMAL LOW (ref 13.0–17.0)
Immature Granulocytes: 0 %
Lymphocytes Relative: 38 %
Lymphs Abs: 0.9 10*3/uL (ref 0.7–4.0)
MCH: 33.6 pg (ref 26.0–34.0)
MCHC: 32.6 g/dL (ref 30.0–36.0)
MCV: 103.1 fL — ABNORMAL HIGH (ref 80.0–100.0)
Monocytes Absolute: 0.2 10*3/uL (ref 0.1–1.0)
Monocytes Relative: 7 %
Neutro Abs: 1.2 10*3/uL — ABNORMAL LOW (ref 1.7–7.7)
Neutrophils Relative %: 51 %
Platelet Count: 69 10*3/uL — ABNORMAL LOW (ref 150–400)
RBC: 2.56 MIL/uL — ABNORMAL LOW (ref 4.22–5.81)
RDW: 14 % (ref 11.5–15.5)
WBC Count: 2.3 10*3/uL — ABNORMAL LOW (ref 4.0–10.5)
nRBC: 0 % (ref 0.0–0.2)

## 2018-03-10 LAB — CMP (CANCER CENTER ONLY)
ALT: 23 U/L (ref 10–47)
AST: 21 U/L (ref 11–38)
Albumin: 3.3 g/dL — ABNORMAL LOW (ref 3.5–5.0)
Alkaline Phosphatase: 59 U/L (ref 26–84)
Anion gap: 2 — ABNORMAL LOW (ref 5–15)
BUN: 19 mg/dL (ref 7–22)
CO2: 29 mmol/L (ref 18–33)
Calcium: 9.5 mg/dL (ref 8.0–10.3)
Chloride: 107 mmol/L (ref 98–108)
Creatinine: 1.2 mg/dL (ref 0.60–1.20)
Glucose, Bld: 94 mg/dL (ref 73–118)
Potassium: 4.6 mmol/L (ref 3.3–4.7)
Sodium: 138 mmol/L (ref 128–145)
Total Bilirubin: 0.9 mg/dL (ref 0.2–1.6)
Total Protein: 6.3 g/dL — ABNORMAL LOW (ref 6.4–8.1)

## 2018-03-10 MED ORDER — LENALIDOMIDE 20 MG PO CAPS: ORAL_CAPSULE | ORAL | 0 refills | Status: DC

## 2018-03-10 NOTE — Patient Instructions (Signed)
HOLD REVLIMID PILLS FOR THE NEXT SEVEN DAYS.  DO NOT SIGN FOR REVLIMID 25MG  TABLETS.  DOSE WILL BE CHANGED TO 20 MG.

## 2018-03-10 NOTE — Progress Notes (Signed)
Dr. Maylon Peppers notified of today's CBC results.  Hold Velcade today per Dr. Maylon Peppers.  Pt instructed to hold last seven days of Revlimid per Dr. Maylon Peppers and to return on 03/17/18 for next appt of labs/Sarah/velcade and zometa.  Pt verbalizes an understanding of MD instructions.

## 2018-03-17 ENCOUNTER — Inpatient Hospital Stay: Payer: Medicare Other

## 2018-03-17 ENCOUNTER — Inpatient Hospital Stay (HOSPITAL_BASED_OUTPATIENT_CLINIC_OR_DEPARTMENT_OTHER): Payer: Medicare Other | Admitting: Family

## 2018-03-17 ENCOUNTER — Encounter: Payer: Self-pay | Admitting: Family

## 2018-03-17 ENCOUNTER — Other Ambulatory Visit: Payer: TRICARE For Life (TFL)

## 2018-03-17 ENCOUNTER — Other Ambulatory Visit: Payer: Self-pay

## 2018-03-17 ENCOUNTER — Inpatient Hospital Stay: Payer: TRICARE For Life (TFL)

## 2018-03-17 VITALS — BP 166/79

## 2018-03-17 VITALS — BP 185/89 | HR 69 | Temp 97.6°F | Resp 18 | Wt 215.0 lb

## 2018-03-17 DIAGNOSIS — M899 Disorder of bone, unspecified: Secondary | ICD-10-CM

## 2018-03-17 DIAGNOSIS — Z9484 Stem cells transplant status: Secondary | ICD-10-CM | POA: Diagnosis not present

## 2018-03-17 DIAGNOSIS — C9002 Multiple myeloma in relapse: Secondary | ICD-10-CM

## 2018-03-17 DIAGNOSIS — Z79899 Other long term (current) drug therapy: Secondary | ICD-10-CM | POA: Diagnosis not present

## 2018-03-17 DIAGNOSIS — Z5112 Encounter for antineoplastic immunotherapy: Secondary | ICD-10-CM | POA: Diagnosis not present

## 2018-03-17 DIAGNOSIS — Z7982 Long term (current) use of aspirin: Secondary | ICD-10-CM | POA: Diagnosis not present

## 2018-03-17 DIAGNOSIS — D508 Other iron deficiency anemias: Secondary | ICD-10-CM

## 2018-03-17 DIAGNOSIS — C9000 Multiple myeloma not having achieved remission: Secondary | ICD-10-CM

## 2018-03-17 DIAGNOSIS — D649 Anemia, unspecified: Secondary | ICD-10-CM | POA: Diagnosis not present

## 2018-03-17 LAB — CBC WITH DIFFERENTIAL (CANCER CENTER ONLY)
Abs Immature Granulocytes: 0 10*3/uL (ref 0.00–0.07)
Basophils Absolute: 0 10*3/uL (ref 0.0–0.1)
Basophils Relative: 0 %
Eosinophils Absolute: 0.1 10*3/uL (ref 0.0–0.5)
Eosinophils Relative: 3 %
HCT: 26.3 % — ABNORMAL LOW (ref 39.0–52.0)
Hemoglobin: 8.4 g/dL — ABNORMAL LOW (ref 13.0–17.0)
Immature Granulocytes: 0 %
Lymphocytes Relative: 53 %
Lymphs Abs: 1.3 10*3/uL (ref 0.7–4.0)
MCH: 33.7 pg (ref 26.0–34.0)
MCHC: 31.9 g/dL (ref 30.0–36.0)
MCV: 105.6 fL — ABNORMAL HIGH (ref 80.0–100.0)
Monocytes Absolute: 0.2 10*3/uL (ref 0.1–1.0)
Monocytes Relative: 8 %
Neutro Abs: 0.9 10*3/uL — ABNORMAL LOW (ref 1.7–7.7)
Neutrophils Relative %: 36 %
Platelet Count: 103 10*3/uL — ABNORMAL LOW (ref 150–400)
RBC: 2.49 MIL/uL — ABNORMAL LOW (ref 4.22–5.81)
RDW: 14.4 % (ref 11.5–15.5)
WBC Count: 2.4 10*3/uL — ABNORMAL LOW (ref 4.0–10.5)
nRBC: 0 % (ref 0.0–0.2)

## 2018-03-17 LAB — CMP (CANCER CENTER ONLY)
ALT: 30 U/L (ref 10–47)
AST: 30 U/L (ref 11–38)
Albumin: 3.4 g/dL — ABNORMAL LOW (ref 3.5–5.0)
Alkaline Phosphatase: 60 U/L (ref 26–84)
Anion gap: 0 — ABNORMAL LOW (ref 5–15)
BUN: 18 mg/dL (ref 7–22)
CO2: 29 mmol/L (ref 18–33)
Calcium: 9.5 mg/dL (ref 8.0–10.3)
Chloride: 107 mmol/L (ref 98–108)
Creatinine: 1.3 mg/dL — ABNORMAL HIGH (ref 0.60–1.20)
Glucose, Bld: 95 mg/dL (ref 73–118)
Potassium: 3.9 mmol/L (ref 3.3–4.7)
Sodium: 136 mmol/L (ref 128–145)
Total Bilirubin: 0.9 mg/dL (ref 0.2–1.6)
Total Protein: 6.8 g/dL (ref 6.4–8.1)

## 2018-03-17 LAB — RETICULOCYTES
Immature Retic Fract: 17.4 % — ABNORMAL HIGH (ref 2.3–15.9)
RBC.: 2.49 MIL/uL — ABNORMAL LOW (ref 4.22–5.81)
Retic Count, Absolute: 35.9 10*3/uL (ref 19.0–186.0)
Retic Ct Pct: 1.4 % (ref 0.4–3.1)

## 2018-03-17 MED ORDER — BORTEZOMIB CHEMO SQ INJECTION 3.5 MG (2.5MG/ML): 1.3000 mg/m2 | Freq: Once | INTRAMUSCULAR | Status: DC

## 2018-03-17 MED ORDER — SODIUM CHLORIDE 0.9 % IV SOLN
Freq: Once | INTRAVENOUS | Status: AC
  Administered 2018-03-17: 14:00:00 via INTRAVENOUS
  Filled 2018-03-17: qty 250

## 2018-03-17 MED ORDER — ZOLEDRONIC ACID 4 MG/100ML IV SOLN
4.0000 mg | Freq: Once | INTRAVENOUS | Status: AC
  Administered 2018-03-17: 4 mg via INTRAVENOUS
  Filled 2018-03-17: qty 100

## 2018-03-17 MED ORDER — PROCHLORPERAZINE MALEATE 10 MG PO TABS: 10.0000 mg | ORAL_TABLET | Freq: Once | ORAL | Status: DC

## 2018-03-17 NOTE — Progress Notes (Signed)
Patient takes his Decadron at home tomorrow, bottle directions were verified and that appears correct. Will not give today

## 2018-03-17 NOTE — Progress Notes (Signed)
Hematology and Oncology Follow Up Visit  Robert [name] [id number] 1934-03-16 84 y.o. 03/17/2018   Principle Diagnosis:  IgG Kappa Myeloma-Relapsed  Current Therapy:   RVD - S/p cycle 1 Zometa 4 mg IV q 21months - next dose on 03/2018   Interim History: Robert [name] is here today for follow-up. He is doing well and has no complaints at this time. He has had no new syncopal episodes, no falls. His platelet count has improved and is now 103 but his ANC is down at 0.9.  He Revlimid has been on hold since last week.  Earlier this month IgG level was 853 mg/dL and kappa light chain 11.36 mg/dL. M-spike was 1.1.  No episodes of bleeding.  No issue with infections. No fever, chills, n/v, cough, rash, dizziness, SOB, chest pain, palpitations, abdominal pain or changes in bowel or bladder habits.  No weakness, swelling, tenderness, numbness or tingling in his extremities at this time. No c/o pain.  No lymphadenopathy noted on exam.  He has a good appetite and he is staying hydrated. His weight is stable.   ECOG Performance Status: 1 - Symptomatic but completely ambulatory  Medications:  Allergies as of 03/17/2018   No Known Allergies     Medication List        Accurate as of 03/17/18  1:35 PM. Always use your most recent med list.          aspirin EC 325 MG tablet Take 1 tablet (325 mg total) by mouth daily.   bimatoprost 0.01 % Soln Commonly known as:  LUMIGAN Place 1 drop into both eyes at bedtime.   CALTRATE 600+D PLUS PO Take 1 tablet by mouth daily.   carvedilol 25 MG tablet Commonly known as:  COREG Take 25 mg by mouth 2 (two) times daily with a meal.   cloNIDine 0.1 MG tablet Commonly known as:  CATAPRES Take 0.1 mg by mouth 2 (two) times daily as needed (elevated blood pressure >190/80).   CVS VITAMIN B12 2000 MCG tablet Generic drug:  cyanocobalamin Take 2,000 mcg by mouth at bedtime.   dexamethasone 4 MG tablet Commonly known as:  DECADRON Take 5 tablets (20  mg total) by mouth once a week.   famciclovir 250 MG tablet Commonly known as:  FAMVIR Take 1 tablet (250 mg total) by mouth daily.   fluticasone 50 MCG/ACT nasal spray Commonly known as:  FLONASE Place 2 sprays into both nostrils 2 (two) times daily as needed (seasonal allergies).   hydrochlorothiazide 25 MG tablet Commonly known as:  HYDRODIURIL Take 25 mg by mouth daily.   lenalidomide 20 MG capsule Commonly known as:  REVLIMID Take 1 capsule daily at bedtime for 21 days on then 7 days off. [id number]   losartan 100 MG tablet Commonly known as:  COZAAR Take 100 mg by mouth daily.   multivitamin with minerals Tabs tablet Take 1 tablet by mouth daily.   potassium chloride SA 20 MEQ tablet Commonly known as:  K-DUR,KLOR-CON Take 20 mEq by mouth daily.   pravastatin 20 MG tablet Commonly known as:  PRAVACHOL Take 20 mg by mouth at bedtime.   PROAIR HFA 108 (90 Base) MCG/ACT inhaler Generic drug:  albuterol Inhale 2 puffs into the lungs every 6 (six) hours as needed for wheezing or shortness of breath.   prochlorperazine 10 MG tablet Commonly known as:  COMPAZINE Take 1 tablet (10 mg total) by mouth every 6 (six) hours as needed for nausea or vomiting.  SYSTANE OP Place 1 drop into both eyes daily as needed (dry eyes).   tadalafil 20 MG tablet Commonly known as:  ADCIRCA/CIALIS Take 20 mg by mouth daily as needed for erectile dysfunction.   Vitamin D3 1000 units Caps Take 1,000 Units by mouth daily.   ZOMETA 4 MG/100ML IVPB Generic drug:  Zoledronic Acid Inject 4 mg into the vein every 4 (four) months. Administered by Dr. Jonette Eva at [facility] - last infusion mid July 2019       Allergies: No Known Allergies  Past Medical History, Surgical history, Social history, and Family History were reviewed and updated.  Review of Systems: All other 10 point review of systems is negative.   Physical Exam:  weight is 215 lb (97.5 kg). His oral  temperature is 97.6 F (36.4 C). His blood pressure is 185/89 (abnormal) and his pulse is 69. His respiration is 18 and oxygen saturation is 100%.   Wt Readings from Last 3 Encounters:  03/17/18 215 lb (97.5 kg)  02/24/18 217 lb (98.4 kg)  01/27/18 210 lb (95.3 kg)    Ocular: Sclerae unicteric, pupils equal, round and reactive to light Ear-nose-throat: Oropharynx clear, dentition fair Lymphatic: No cervical, supraclavicular or axillary adenopathy Lungs no rales or rhonchi, good excursion bilaterally Heart regular rate and rhythm, no murmur appreciated Abd soft, nontender, positive bowel sounds, no liver or spleen tip palpated on exam, no fluid wave  MSK no focal spinal tenderness, no joint edema Neuro: non-focal, well-oriented, appropriate affect Breasts: Deferred   Lab Results  Component Value Date   WBC 2.4 (L) 03/17/2018   HGB 8.4 (L) 03/17/2018   HCT 26.3 (L) 03/17/2018   MCV 105.6 (H) 03/17/2018   PLT 103 (L) 03/17/2018   Lab Results  Component Value Date   FERRITIN 353 (H) 02/24/2018   IRON 57 02/24/2018   TIBC 255 02/24/2018   UIBC 198 02/24/2018   IRONPCTSAT 22 (L) 02/24/2018   Lab Results  Component Value Date   RETICCTPCT 1.4 03/17/2018   RBC 2.49 (L) 03/17/2018   RBC 2.49 (L) 03/17/2018   Lab Results  Component Value Date   KPAFRELGTCHN 113.6 (H) 02/24/2018   LAMBDASER 20.1 02/24/2018   KAPLAMBRATIO 5.65 (H) 02/24/2018   Lab Results  Component Value Date   IGGSERUM 853 02/24/2018   IGGSERUM 1,398 02/24/2018   IGA 61 02/24/2018   IGA 59 (L) 02/24/2018   IGMSERUM 78 02/24/2018   IGMSERUM 75 02/24/2018   Lab Results  Component Value Date   TOTALPROTELP 6.2 02/24/2018   ALBUMINELP 3.2 02/24/2018   A1GS 0.2 02/24/2018   A2GS 0.6 02/24/2018   BETS 0.8 02/24/2018   BETA2SER 0.3 01/03/2015   GAMS 1.4 02/24/2018   MSPIKE 1.1 (H) 02/24/2018   SPEI * 01/03/2015     Chemistry      Component Value Date/Time   NA 138 03/10/2018 1315   NA 137  04/08/2017 1141   NA 138 10/29/2015 1029   K 4.6 03/10/2018 1315   K 3.4 04/08/2017 1141   K 3.9 10/29/2015 1029   CL 107 03/10/2018 1315   CL 102 04/08/2017 1141   CO2 29 03/10/2018 1315   CO2 30 04/08/2017 1141   CO2 27 10/29/2015 1029   BUN 19 03/10/2018 1315   BUN 16 04/08/2017 1141   BUN 13.8 10/29/2015 1029   CREATININE 1.20 03/10/2018 1315   CREATININE 1.3 (H) 04/08/2017 1141   CREATININE 1.1 10/29/2015 1029  Component Value Date/Time   CALCIUM 9.5 03/10/2018 1315   CALCIUM 9.3 04/08/2017 1141   CALCIUM 9.3 10/29/2015 1029   ALKPHOS 59 03/10/2018 1315   ALKPHOS 66 04/08/2017 1141   ALKPHOS 52 10/29/2015 1029   AST 21 03/10/2018 1315   AST 20 10/29/2015 1029   ALT 23 03/10/2018 1315   ALT 21 04/08/2017 1141   ALT 14 10/29/2015 1029   BILITOT 0.9 03/10/2018 1315   BILITOT 0.75 10/29/2015 1029      Impression and Plan: Mr. Halderman is a very pleasant 84 yo gentleman with IgG kappa myeloma with history of stem cell transplant in 2006.  With his [place] dropping to 0.9 I spoke with Dr. Maylon Peppers and we will hold Velcade treatment today and continue to hold the Revlimid. If counts are improved next week we will resume treatment.   We will plan to follow-up in 1 month.  He will contact our office with any questions or concerns. We can certainly see him sooner if need be.   Laverna Peace, NP 10/24/20191:35 PM

## 2018-03-17 NOTE — Patient Instructions (Addendum)
[facility] Discharge Instructions for Patients Receiving Chemotherapy  Today you received the following chemotherapy agents:  NONE today. To help prevent nausea and vomiting after your treatment, we encourage you to take your nausea medication as ordered per MD.    If you develop nausea and vomiting that is not controlled by your nausea medication, call the clinic.   BELOW ARE SYMPTOMS THAT SHOULD BE REPORTED IMMEDIATELY:  *FEVER GREATER THAN 100.5 F  *CHILLS WITH OR WITHOUT FEVER  NAUSEA AND VOMITING THAT IS NOT CONTROLLED WITH YOUR NAUSEA MEDICATION  *UNUSUAL SHORTNESS OF BREATH  *UNUSUAL BRUISING OR BLEEDING  TENDERNESS IN MOUTH AND THROAT WITH OR WITHOUT PRESENCE OF ULCERS  *URINARY PROBLEMS  *BOWEL PROBLEMS  UNUSUAL RASH Items with * indicate a potential emergency and should be followed up as soon as possible.  Feel free to call the clinic should you have any questions or concerns. The clinic phone number is [phone].  Please show the [place] at check-in to the Emergency Department and triage nurse.  Zoledronic Acid injection (Hypercalcemia, Oncology) What is this medicine? ZOLEDRONIC ACID (ZOE le dron ik AS id) lowers the amount of calcium loss from bone. It is used to treat too much calcium in your blood from cancer. It is also used to prevent complications of cancer that has spread to the bone. This medicine may be used for other purposes; ask your health care provider or pharmacist if you have questions. COMMON BRAND NAME(S): Zometa What should I tell my health care provider before I take this medicine? They need to know if you have any of these conditions: -aspirin-sensitive asthma -cancer, especially if you are receiving medicines used to treat cancer -dental disease or wear dentures -infection -kidney disease -receiving corticosteroids like dexamethasone or prednisone -an unusual or allergic reaction to zoledronic acid, other  medicines, foods, dyes, or preservatives -pregnant or trying to get pregnant -breast-feeding How should I use this medicine? This medicine is for infusion into a vein. It is given by a health care professional in a hospital or clinic setting. Talk to your pediatrician regarding the use of this medicine in children. Special care may be needed. Overdosage: If you think you have taken too much of this medicine contact a poison control center or emergency room at once. NOTE: This medicine is only for you. Do not share this medicine with others. What if I miss a dose? It is important not to miss your dose. Call your doctor or health care professional if you are unable to keep an appointment. What may interact with this medicine? -certain antibiotics given by injection -NSAIDs, medicines for pain and inflammation, like ibuprofen or naproxen -some diuretics like bumetanide, furosemide -teriparatide -thalidomide This list may not describe all possible interactions. Give your health care provider a list of all the medicines, herbs, non-prescription drugs, or dietary supplements you use. Also tell them if you smoke, drink alcohol, or use illegal drugs. Some items may interact with your medicine. What should I watch for while using this medicine? Visit your doctor or health care professional for regular checkups. It may be some time before you see the benefit from this medicine. Do not stop taking your medicine unless your doctor tells you to. Your doctor may order blood tests or other tests to see how you are doing. Women should inform their doctor if they wish to become pregnant or think they might be pregnant. There is a potential for serious side effects to an  unborn child. Talk to your health care professional or pharmacist for more information. You should make sure that you get enough calcium and vitamin D while you are taking this medicine. Discuss the foods you eat and the vitamins you take with  your health care professional. Some people who take this medicine have severe bone, joint, and/or muscle pain. This medicine may also increase your risk for jaw problems or a broken thigh bone. Tell your doctor right away if you have severe pain in your jaw, bones, joints, or muscles. Tell your doctor if you have any pain that does not go away or that gets worse. Tell your dentist and dental surgeon that you are taking this medicine. You should not have major dental surgery while on this medicine. See your dentist to have a dental exam and fix any dental problems before starting this medicine. Take good care of your teeth while on this medicine. Make sure you see your dentist for regular follow-up appointments. What side effects may I notice from receiving this medicine? Side effects that you should report to your doctor or health care professional as soon as possible: -allergic reactions like skin rash, itching or hives, swelling of the face, lips, or tongue -anxiety, confusion, or depression -breathing problems -changes in vision -eye pain -feeling faint or lightheaded, falls -jaw pain, especially after dental work -mouth sores -muscle cramps, stiffness, or weakness -redness, blistering, peeling or loosening of the skin, including inside the mouth -trouble passing urine or change in the amount of urine Side effects that usually do not require medical attention (report to your doctor or health care professional if they continue or are bothersome): -bone, joint, or muscle pain -constipation -diarrhea -fever -hair loss -irritation at site where injected -loss of appetite -nausea, vomiting -stomach upset -trouble sleeping -trouble swallowing -weak or tired This list may not describe all possible side effects. Call your doctor for medical advice about side effects. You may report side effects to FDA at 1-800-FDA-1088. Where should I keep my medicine? This drug is given in a hospital or  clinic and will not be stored at home. NOTE: This sheet is a summary. It may not cover all possible information. If you have questions about this medicine, talk to your doctor, pharmacist, or health care provider.  2018 Elsevier/Gold Standard (2013-10-07 14:19:39)

## 2018-03-18 LAB — IGG, IGA, IGM
IgA: 59 mg/dL — ABNORMAL LOW (ref 61–437)
IgG (Immunoglobin G), Serum: 1393 mg/dL (ref 700–1600)
IgM (Immunoglobulin M), Srm: 75 mg/dL (ref 15–143)

## 2018-03-18 LAB — IRON AND TIBC
Iron: 87 ug/dL (ref 42–163)
Saturation Ratios: 30 % — ABNORMAL LOW (ref 42–163)
TIBC: 289 ug/dL (ref 202–409)
UIBC: 201 ug/dL

## 2018-03-18 LAB — LACTATE DEHYDROGENASE: LDH: 214 U/L — ABNORMAL HIGH (ref 98–192)

## 2018-03-18 LAB — FERRITIN: Ferritin: 432 ng/mL — ABNORMAL HIGH (ref 24–336)

## 2018-03-18 LAB — ERYTHROPOIETIN: Erythropoietin: 96.8 m[IU]/mL — ABNORMAL HIGH (ref 2.6–18.5)

## 2018-03-21 LAB — KAPPA/LAMBDA LIGHT CHAINS
Kappa free light chain: 126.4 mg/L — ABNORMAL HIGH (ref 3.3–19.4)
Kappa, lambda light chain ratio: 5.8 — ABNORMAL HIGH (ref 0.26–1.65)
Lambda free light chains: 21.8 mg/L (ref 5.7–26.3)

## 2018-03-23 LAB — PROTEIN ELECTROPHORESIS, SERUM, WITH REFLEX
A/G Ratio: 1.1 (ref 0.7–1.7)
Albumin ELP: 3.4 g/dL (ref 2.9–4.4)
Alpha-1-Globulin: 0.2 g/dL (ref 0.0–0.4)
Alpha-2-Globulin: 0.7 g/dL (ref 0.4–1.0)
Beta Globulin: 0.9 g/dL (ref 0.7–1.3)
Gamma Globulin: 1.3 g/dL (ref 0.4–1.8)
Globulin, Total: 3.1 g/dL (ref 2.2–3.9)
M-Spike, %: 0.9 g/dL — ABNORMAL HIGH
SPEP Interpretation: 0
Total Protein ELP: 6.5 g/dL (ref 6.0–8.5)

## 2018-03-23 LAB — IMMUNOFIXATION REFLEX, SERUM
IgA: 67 mg/dL (ref 61–437)
IgG (Immunoglobin G), Serum: 1397 mg/dL (ref 700–1600)
IgM (Immunoglobulin M), Srm: 79 mg/dL (ref 15–143)

## 2018-03-24 ENCOUNTER — Other Ambulatory Visit: Payer: TRICARE For Life (TFL)

## 2018-03-24 ENCOUNTER — Ambulatory Visit: Payer: TRICARE For Life (TFL) | Admitting: Hematology & Oncology

## 2018-03-24 ENCOUNTER — Inpatient Hospital Stay: Payer: TRICARE For Life (TFL)

## 2018-03-25 ENCOUNTER — Telehealth: Payer: Self-pay | Admitting: *Deleted

## 2018-03-25 NOTE — Telephone Encounter (Signed)
Message received from patient asking what dose of Revlimid he should be taking.  Dr. Marin Olp notified and would like for pt to continue to hold Revlimid and to add MD appt to patient's next visit on 03/31/18.  Call placed to patient to inform him of MD orders. Pt appreciative of call back and has no questions at this time.

## 2018-03-31 ENCOUNTER — Inpatient Hospital Stay: Payer: Medicare Other

## 2018-03-31 ENCOUNTER — Other Ambulatory Visit: Payer: Self-pay | Admitting: *Deleted

## 2018-03-31 ENCOUNTER — Inpatient Hospital Stay: Payer: Medicare Other | Attending: Hematology & Oncology

## 2018-03-31 ENCOUNTER — Other Ambulatory Visit: Payer: Self-pay

## 2018-03-31 ENCOUNTER — Encounter: Payer: Self-pay | Admitting: Hematology & Oncology

## 2018-03-31 ENCOUNTER — Other Ambulatory Visit: Payer: TRICARE For Life (TFL)

## 2018-03-31 ENCOUNTER — Inpatient Hospital Stay (HOSPITAL_BASED_OUTPATIENT_CLINIC_OR_DEPARTMENT_OTHER): Payer: Medicare Other | Admitting: Hematology & Oncology

## 2018-03-31 ENCOUNTER — Inpatient Hospital Stay: Payer: TRICARE For Life (TFL)

## 2018-03-31 VITALS — BP 177/83 | HR 65 | Temp 97.7°F | Resp 18 | Wt 213.0 lb

## 2018-03-31 DIAGNOSIS — Z7982 Long term (current) use of aspirin: Secondary | ICD-10-CM | POA: Diagnosis not present

## 2018-03-31 DIAGNOSIS — C9002 Multiple myeloma in relapse: Secondary | ICD-10-CM

## 2018-03-31 DIAGNOSIS — Z5112 Encounter for antineoplastic immunotherapy: Secondary | ICD-10-CM | POA: Insufficient documentation

## 2018-03-31 DIAGNOSIS — Z79899 Other long term (current) drug therapy: Secondary | ICD-10-CM | POA: Insufficient documentation

## 2018-03-31 DIAGNOSIS — Z9484 Stem cells transplant status: Secondary | ICD-10-CM | POA: Insufficient documentation

## 2018-03-31 LAB — CBC WITH DIFFERENTIAL (CANCER CENTER ONLY)
Abs Immature Granulocytes: 0.01 10*3/uL (ref 0.00–0.07)
Basophils Absolute: 0 10*3/uL (ref 0.0–0.1)
Basophils Relative: 0 %
Eosinophils Absolute: 0 10*3/uL (ref 0.0–0.5)
Eosinophils Relative: 0 %
HCT: 26.2 % — ABNORMAL LOW (ref 39.0–52.0)
Hemoglobin: 8.3 g/dL — ABNORMAL LOW (ref 13.0–17.0)
Immature Granulocytes: 0 %
Lymphocytes Relative: 42 %
Lymphs Abs: 1.6 10*3/uL (ref 0.7–4.0)
MCH: 32.8 pg (ref 26.0–34.0)
MCHC: 31.7 g/dL (ref 30.0–36.0)
MCV: 103.6 fL — ABNORMAL HIGH (ref 80.0–100.0)
Monocytes Absolute: 0.5 10*3/uL (ref 0.1–1.0)
Monocytes Relative: 14 %
Neutro Abs: 1.7 10*3/uL (ref 1.7–7.7)
Neutrophils Relative %: 44 %
Platelet Count: 187 10*3/uL (ref 150–400)
RBC: 2.53 MIL/uL — ABNORMAL LOW (ref 4.22–5.81)
RDW: 15.1 % (ref 11.5–15.5)
WBC Count: 3.9 10*3/uL — ABNORMAL LOW (ref 4.0–10.5)
nRBC: 0 % (ref 0.0–0.2)

## 2018-03-31 LAB — CMP (CANCER CENTER ONLY)
ALT: 14 U/L (ref 10–47)
AST: 32 U/L (ref 11–38)
Albumin: 3.6 g/dL (ref 3.5–5.0)
Alkaline Phosphatase: 59 U/L (ref 26–84)
Anion gap: 6 (ref 5–15)
BUN: 16 mg/dL (ref 7–22)
CO2: 29 mmol/L (ref 18–33)
Calcium: 9.5 mg/dL (ref 8.0–10.3)
Chloride: 103 mmol/L (ref 98–108)
Creatinine: 1.2 mg/dL (ref 0.60–1.20)
Glucose, Bld: 98 mg/dL (ref 73–118)
Potassium: 4.2 mmol/L (ref 3.3–4.7)
Sodium: 138 mmol/L (ref 128–145)
Total Bilirubin: 0.9 mg/dL (ref 0.2–1.6)
Total Protein: 7.1 g/dL (ref 6.4–8.1)

## 2018-03-31 LAB — PREPARE RBC (CROSSMATCH)

## 2018-03-31 MED ORDER — BORTEZOMIB CHEMO SQ INJECTION 3.5 MG (2.5MG/ML)
1.3000 mg/m2 | Freq: Once | INTRAMUSCULAR | Status: AC
  Administered 2018-03-31: 2.75 mg via SUBCUTANEOUS
  Filled 2018-03-31: qty 1.1

## 2018-03-31 MED ORDER — PROCHLORPERAZINE MALEATE 10 MG PO TABS: 10.0000 mg | ORAL_TABLET | Freq: Once | ORAL | Status: DC

## 2018-03-31 NOTE — Progress Notes (Signed)
Hematology and Oncology Follow Up Visit  Robert [name] [id number] 18-Sep-1933 84 y.o. 03/31/2018   Principle Diagnosis:  IgG Kappa Myeloma-Relapsed -- Trisomy 35, 13q-  Current Therapy:   RVD - S/p cycle #2 -- revlimid on hold Zometa 4 mg IV q 92months - next dose on 07/2018   Interim History: Robert [name] is here today for follow-up.  Unfortunately, he has had problems with his blood counts.  I suspect that this probably is the Revlimid.  I am holding his Revlimid for right now.  He is quite anemic.  His hemoglobin is 8.2.  I think we are going to have to transfuse him.  I do still think that iron or ESA is going get his blood up quickly enough for [place].  He is symptomatic.  He is having more leg swelling.  He is having more fatigue.  Because of the fatigue, he is not exercising and his weight is not going down.  His myeloma studies have improved.  His M spike is down to 0.9.  As such, I really want to try to continue him on Velcade at least for right now.  His IgG level is 1400 mg/dL.  His Kappa light chain is 12.6 mg/dL.  I think that the fact that he had the stem cell transplant quite a while back might increase the bone marrow sensitivity to the Revlimid.  I talked to he and his wife at length today about the blood transfusion.  I explained why I thought it would be worthwhile.  I explained that it was safe.  We do not worry about HIV or hepatitis anymore.  I think the benefits clearly outweigh the risks.  He has had no problems with diarrhea.  He has had no issues with cough or shortness of breath.  Overall, his performance status is ECOG 1.   Medications:  Allergies as of 03/31/2018   No Known Allergies     Medication List        Accurate as of 03/31/18  2:41 PM. Always use your most recent med list.          aspirin EC 325 MG tablet Take 1 tablet (325 mg total) by mouth daily.   bimatoprost 0.01 % Soln Commonly known as:  LUMIGAN Place 1 drop into both eyes at  bedtime.   CALTRATE 600+D PLUS PO Take 1 tablet by mouth daily.   carvedilol 25 MG tablet Commonly known as:  COREG Take 25 mg by mouth 2 (two) times daily with a meal.   cloNIDine 0.1 MG tablet Commonly known as:  CATAPRES Take 0.1 mg by mouth 2 (two) times daily as needed (elevated blood pressure >190/80).   CVS VITAMIN B12 2000 MCG tablet Generic drug:  cyanocobalamin Take 2,000 mcg by mouth at bedtime.   dexamethasone 4 MG tablet Commonly known as:  DECADRON Take 5 tablets (20 mg total) by mouth once a week.   famciclovir 250 MG tablet Commonly known as:  FAMVIR Take 1 tablet (250 mg total) by mouth daily.   fluticasone 50 MCG/ACT nasal spray Commonly known as:  FLONASE Place 2 sprays into both nostrils 2 (two) times daily as needed (seasonal allergies).   hydrochlorothiazide 25 MG tablet Commonly known as:  HYDRODIURIL Take 25 mg by mouth daily.   lenalidomide 20 MG capsule Commonly known as:  REVLIMID Take 1 capsule daily at bedtime for 21 days on then 7 days off. [id number]   losartan 100 MG tablet Commonly known as:  COZAAR Take 100 mg by mouth daily.   multivitamin with minerals Tabs tablet Take 1 tablet by mouth daily.   potassium chloride SA 20 MEQ tablet Commonly known as:  K-DUR,KLOR-CON Take 20 mEq by mouth daily.   pravastatin 20 MG tablet Commonly known as:  PRAVACHOL Take 20 mg by mouth at bedtime.   PROAIR HFA 108 (90 Base) MCG/ACT inhaler Generic drug:  albuterol Inhale 2 puffs into the lungs every 6 (six) hours as needed for wheezing or shortness of breath.   prochlorperazine 10 MG tablet Commonly known as:  COMPAZINE Take 1 tablet (10 mg total) by mouth every 6 (six) hours as needed for nausea or vomiting.   SYSTANE OP Place 1 drop into both eyes daily as needed (dry eyes).   tadalafil 20 MG tablet Commonly known as:  ADCIRCA/CIALIS Take 20 mg by mouth daily as needed for erectile dysfunction.   Vitamin D3 25 MCG (1000 UT)  Caps Take 1,000 Units by mouth daily.   ZOMETA 4 MG/100ML IVPB Generic drug:  Zoledronic Acid Inject 4 mg into the vein every 4 (four) months. Administered by Dr. Jonette Eva at [facility] - last infusion mid July 2019       Allergies: No Known Allergies  Past Medical History, Surgical history, Social history, and Family History were reviewed and updated.  Review of Systems: Review of Systems  Constitutional: Positive for malaise/fatigue.  HENT: Negative.   Eyes: Negative.   Respiratory: Positive for shortness of breath.   Cardiovascular: Positive for leg swelling.  Gastrointestinal: Negative.   Genitourinary: Negative.   Musculoskeletal: Negative.   Skin: Negative.   Neurological: Negative.   Endo/Heme/Allergies: Negative.   Psychiatric/Behavioral: Negative.      Physical Exam:  weight is 213 lb (96.6 kg). His oral temperature is 97.7 F (36.5 C). His blood pressure is 177/83 (abnormal) and his pulse is 65. His respiration is 18 and oxygen saturation is 100%.   Wt Readings from Last 3 Encounters:  03/31/18 213 lb (96.6 kg)  03/17/18 215 lb (97.5 kg)  02/24/18 217 lb (98.4 kg)    Physical Exam  Constitutional: He is oriented to person, place, and time.  HENT:  Head: Normocephalic and atraumatic.  Mouth/Throat: Oropharynx is clear and moist.  Eyes: Pupils are equal, round, and reactive to light. EOM are normal.  Neck: Normal range of motion.  Cardiovascular: Normal rate, regular rhythm and normal heart sounds.  Pulmonary/Chest: Effort normal and breath sounds normal.  Abdominal: Soft. Bowel sounds are normal.  Musculoskeletal: Normal range of motion. He exhibits no edema, tenderness or deformity.  Lymphadenopathy:    He has no cervical adenopathy.  Neurological: He is alert and oriented to person, place, and time.  Skin: Skin is warm and dry. No rash noted. No erythema.  Psychiatric: He has a normal mood and affect. His behavior is normal. Judgment and  thought content normal.  Vitals reviewed.    Lab Results  Component Value Date   WBC 3.9 (L) 03/31/2018   HGB 8.3 (L) 03/31/2018   HCT 26.2 (L) 03/31/2018   MCV 103.6 (H) 03/31/2018   PLT 187 03/31/2018   Lab Results  Component Value Date   FERRITIN 432 (H) 03/17/2018   IRON 87 03/17/2018   TIBC 289 03/17/2018   UIBC 201 03/17/2018   IRONPCTSAT 30 (L) 03/17/2018   Lab Results  Component Value Date   RETICCTPCT 1.4 03/17/2018   RBC 2.53 (L) 03/31/2018   Lab Results  Component  Value Date   KPAFRELGTCHN 126.4 (H) 03/17/2018   LAMBDASER 21.8 03/17/2018   KAPLAMBRATIO 5.80 (H) 03/17/2018   Lab Results  Component Value Date   IGGSERUM 1,393 03/17/2018   IGGSERUM 1,397 03/17/2018   IGA 59 (L) 03/17/2018   IGA 67 03/17/2018   IGMSERUM 75 03/17/2018   IGMSERUM 79 03/17/2018   Lab Results  Component Value Date   TOTALPROTELP 6.5 03/17/2018   ALBUMINELP 3.4 03/17/2018   A1GS 0.2 03/17/2018   A2GS 0.7 03/17/2018   BETS 0.9 03/17/2018   BETA2SER 0.3 01/03/2015   GAMS 1.3 03/17/2018   MSPIKE 0.9 (H) 03/17/2018   SPEI * 01/03/2015     Chemistry      Component Value Date/Time   NA 138 03/31/2018 1319   NA 137 04/08/2017 1141   NA 138 10/29/2015 1029   K 4.2 03/31/2018 1319   K 3.4 04/08/2017 1141   K 3.9 10/29/2015 1029   CL 103 03/31/2018 1319   CL 102 04/08/2017 1141   CO2 29 03/31/2018 1319   CO2 30 04/08/2017 1141   CO2 27 10/29/2015 1029   BUN 16 03/31/2018 1319   BUN 16 04/08/2017 1141   BUN 13.8 10/29/2015 1029   CREATININE 1.20 03/31/2018 1319   CREATININE 1.3 (H) 04/08/2017 1141   CREATININE 1.1 10/29/2015 1029      Component Value Date/Time   CALCIUM 9.5 03/31/2018 1319   CALCIUM 9.3 04/08/2017 1141   CALCIUM 9.3 10/29/2015 1029   ALKPHOS 59 03/31/2018 1319   ALKPHOS 66 04/08/2017 1141   ALKPHOS 52 10/29/2015 1029   AST 32 03/31/2018 1319   AST 20 10/29/2015 1029   ALT 14 03/31/2018 1319   ALT 21 04/08/2017 1141   ALT 14 10/29/2015 1029    BILITOT 0.9 03/31/2018 1319   BILITOT 0.75 10/29/2015 1029      Impression and Plan: Mr. Hinely is a very pleasant 84 yo gentleman with IgG kappa myeloma with history of stem cell transplant in 2006.   I will still continue on Velcade.  We will have to make a dosage adjustment with the Revlimid.  I might have to consider him for pomalidomide.  Again I want to get his blood count up better.  I want to see his hemoglobin at least above 11.  I suspect we could use Aranesp.  We will see what his iron levels are.  We will have to follow him very closely for right now.  Again had to make some serious adjustments with his protocol.   Volanda Napoleon, MD 11/7/20192:41 PM

## 2018-03-31 NOTE — Patient Instructions (Signed)

## 2018-04-01 ENCOUNTER — Inpatient Hospital Stay: Payer: Medicare Other

## 2018-04-01 DIAGNOSIS — Z7982 Long term (current) use of aspirin: Secondary | ICD-10-CM | POA: Diagnosis not present

## 2018-04-01 DIAGNOSIS — Z9484 Stem cells transplant status: Secondary | ICD-10-CM | POA: Diagnosis not present

## 2018-04-01 DIAGNOSIS — Z79899 Other long term (current) drug therapy: Secondary | ICD-10-CM | POA: Diagnosis not present

## 2018-04-01 DIAGNOSIS — C9002 Multiple myeloma in relapse: Secondary | ICD-10-CM | POA: Diagnosis not present

## 2018-04-01 DIAGNOSIS — Z5112 Encounter for antineoplastic immunotherapy: Secondary | ICD-10-CM | POA: Diagnosis not present

## 2018-04-01 LAB — IRON AND TIBC
Iron: 81 ug/dL (ref 42–163)
Saturation Ratios: 28 % (ref 20–55)
TIBC: 293 ug/dL (ref 202–409)
UIBC: 212 ug/dL (ref 117–376)

## 2018-04-01 LAB — ABO/RH: ABO/RH(D): AB POS

## 2018-04-01 LAB — FERRITIN: Ferritin: 388 ng/mL — ABNORMAL HIGH (ref 24–336)

## 2018-04-01 MED ORDER — DIPHENHYDRAMINE HCL 25 MG PO CAPS
25.0000 mg | ORAL_CAPSULE | Freq: Once | ORAL | Status: AC
  Administered 2018-04-01: 25 mg via ORAL

## 2018-04-01 MED ORDER — ACETAMINOPHEN 325 MG PO TABS
650.0000 mg | ORAL_TABLET | Freq: Once | ORAL | Status: AC
  Administered 2018-04-01: 650 mg via ORAL

## 2018-04-01 MED ORDER — FUROSEMIDE 10 MG/ML IJ SOLN: 20.0000 mg | Freq: Once | INTRAMUSCULAR | Status: DC

## 2018-04-01 MED ORDER — ACETAMINOPHEN 325 MG PO TABS
ORAL_TABLET | ORAL | Status: AC
  Filled 2018-04-01: qty 2

## 2018-04-01 MED ORDER — DIPHENHYDRAMINE HCL 25 MG PO CAPS
ORAL_CAPSULE | ORAL | Status: AC
  Filled 2018-04-01: qty 1

## 2018-04-01 NOTE — Patient Instructions (Signed)

## 2018-04-02 LAB — TYPE AND SCREEN
ABO/RH(D): AB POS
Antibody Screen: NEGATIVE
Unit division: 0
Unit division: 0

## 2018-04-02 LAB — BPAM RBC
Blood Product Expiration Date: 201912042359
Blood Product Expiration Date: 201912042359
ISSUE DATE / TIME: 201911080727
ISSUE DATE / TIME: 201911080727
Unit Type and Rh: 6200
Unit Type and Rh: 6200

## 2018-04-07 ENCOUNTER — Inpatient Hospital Stay: Payer: Medicare Other

## 2018-04-07 ENCOUNTER — Other Ambulatory Visit: Payer: TRICARE For Life (TFL)

## 2018-04-07 ENCOUNTER — Inpatient Hospital Stay: Payer: TRICARE For Life (TFL)

## 2018-04-07 VITALS — BP 178/81 | HR 52 | Temp 97.4°F | Resp 20

## 2018-04-07 DIAGNOSIS — Z5112 Encounter for antineoplastic immunotherapy: Secondary | ICD-10-CM | POA: Diagnosis not present

## 2018-04-07 DIAGNOSIS — C9002 Multiple myeloma in relapse: Secondary | ICD-10-CM | POA: Diagnosis not present

## 2018-04-07 DIAGNOSIS — Z79899 Other long term (current) drug therapy: Secondary | ICD-10-CM | POA: Diagnosis not present

## 2018-04-07 DIAGNOSIS — Z7982 Long term (current) use of aspirin: Secondary | ICD-10-CM | POA: Diagnosis not present

## 2018-04-07 DIAGNOSIS — Z9484 Stem cells transplant status: Secondary | ICD-10-CM | POA: Diagnosis not present

## 2018-04-07 LAB — CMP (CANCER CENTER ONLY)
ALT: 19 U/L (ref 10–47)
AST: 32 U/L (ref 11–38)
Albumin: 3.6 g/dL (ref 3.5–5.0)
Alkaline Phosphatase: 60 U/L (ref 26–84)
Anion gap: 13 (ref 5–15)
BUN: 19 mg/dL (ref 7–22)
CO2: 29 mmol/L (ref 18–33)
Calcium: 9.8 mg/dL (ref 8.0–10.3)
Chloride: 101 mmol/L (ref 98–108)
Creatinine: 0.9 mg/dL (ref 0.60–1.20)
Glucose, Bld: 93 mg/dL (ref 73–118)
Potassium: 4.1 mmol/L (ref 3.3–4.7)
Sodium: 143 mmol/L (ref 128–145)
Total Bilirubin: 1 mg/dL (ref 0.2–1.6)
Total Protein: 7.3 g/dL (ref 6.4–8.1)

## 2018-04-07 LAB — CBC WITH DIFFERENTIAL (CANCER CENTER ONLY)
Abs Immature Granulocytes: 0.01 10*3/uL (ref 0.00–0.07)
Basophils Absolute: 0 10*3/uL (ref 0.0–0.1)
Basophils Relative: 0 %
Eosinophils Absolute: 0 10*3/uL (ref 0.0–0.5)
Eosinophils Relative: 0 %
HCT: 33.7 % — ABNORMAL LOW (ref 39.0–52.0)
Hemoglobin: 11 g/dL — ABNORMAL LOW (ref 13.0–17.0)
Immature Granulocytes: 0 %
Lymphocytes Relative: 47 %
Lymphs Abs: 1.8 10*3/uL (ref 0.7–4.0)
MCH: 32.6 pg (ref 26.0–34.0)
MCHC: 32.6 g/dL (ref 30.0–36.0)
MCV: 100 fL (ref 80.0–100.0)
Monocytes Absolute: 0.6 10*3/uL (ref 0.1–1.0)
Monocytes Relative: 16 %
Neutro Abs: 1.4 10*3/uL — ABNORMAL LOW (ref 1.7–7.7)
Neutrophils Relative %: 37 %
Platelet Count: 165 10*3/uL (ref 150–400)
RBC: 3.37 MIL/uL — ABNORMAL LOW (ref 4.22–5.81)
RDW: 16.2 % — ABNORMAL HIGH (ref 11.5–15.5)
WBC Count: 3.8 10*3/uL — ABNORMAL LOW (ref 4.0–10.5)
nRBC: 0 % (ref 0.0–0.2)

## 2018-04-07 MED ORDER — PROCHLORPERAZINE MALEATE 10 MG PO TABS: 10.0000 mg | ORAL_TABLET | Freq: Once | ORAL | Status: DC

## 2018-04-07 MED ORDER — BORTEZOMIB CHEMO SQ INJECTION 3.5 MG (2.5MG/ML)
1.3000 mg/m2 | Freq: Once | INTRAMUSCULAR | Status: AC
  Administered 2018-04-07: 2.75 mg via SUBCUTANEOUS
  Filled 2018-04-07: qty 1.1

## 2018-04-07 NOTE — Progress Notes (Signed)
2:33 PM  Ok to treat today-Velcade.  To hold Revlimid until next MD appointment which is 04/28/18. Pt verbalized understanding.

## 2018-04-07 NOTE — Progress Notes (Signed)
Okay to treat with ANC = 1.4 today per Dr. Marin Olp.

## 2018-04-08 ENCOUNTER — Encounter: Payer: Self-pay | Admitting: Hematology & Oncology

## 2018-04-13 ENCOUNTER — Other Ambulatory Visit: Payer: Self-pay | Admitting: *Deleted

## 2018-04-13 DIAGNOSIS — C9002 Multiple myeloma in relapse: Secondary | ICD-10-CM

## 2018-04-14 ENCOUNTER — Other Ambulatory Visit: Payer: Self-pay

## 2018-04-14 ENCOUNTER — Inpatient Hospital Stay: Payer: Medicare Other

## 2018-04-14 ENCOUNTER — Other Ambulatory Visit: Payer: Self-pay | Admitting: *Deleted

## 2018-04-14 VITALS — BP 188/86 | HR 85 | Temp 97.8°F | Resp 18

## 2018-04-14 DIAGNOSIS — C9002 Multiple myeloma in relapse: Secondary | ICD-10-CM

## 2018-04-14 DIAGNOSIS — Z7982 Long term (current) use of aspirin: Secondary | ICD-10-CM | POA: Diagnosis not present

## 2018-04-14 DIAGNOSIS — Z5112 Encounter for antineoplastic immunotherapy: Secondary | ICD-10-CM | POA: Diagnosis not present

## 2018-04-14 DIAGNOSIS — Z9484 Stem cells transplant status: Secondary | ICD-10-CM | POA: Diagnosis not present

## 2018-04-14 DIAGNOSIS — Z79899 Other long term (current) drug therapy: Secondary | ICD-10-CM | POA: Diagnosis not present

## 2018-04-14 LAB — CBC WITH DIFFERENTIAL (CANCER CENTER ONLY)
Abs Immature Granulocytes: 0.02 10*3/uL (ref 0.00–0.07)
Basophils Absolute: 0 10*3/uL (ref 0.0–0.1)
Basophils Relative: 0 %
Eosinophils Absolute: 0 10*3/uL (ref 0.0–0.5)
Eosinophils Relative: 1 %
HCT: 32.6 % — ABNORMAL LOW (ref 39.0–52.0)
Hemoglobin: 10.6 g/dL — ABNORMAL LOW (ref 13.0–17.0)
Immature Granulocytes: 0 %
Lymphocytes Relative: 37 %
Lymphs Abs: 1.7 10*3/uL (ref 0.7–4.0)
MCH: 32.3 pg (ref 26.0–34.0)
MCHC: 32.5 g/dL (ref 30.0–36.0)
MCV: 99.4 fL (ref 80.0–100.0)
Monocytes Absolute: 0.6 10*3/uL (ref 0.1–1.0)
Monocytes Relative: 13 %
Neutro Abs: 2.2 10*3/uL (ref 1.7–7.7)
Neutrophils Relative %: 49 %
Platelet Count: 162 10*3/uL (ref 150–400)
RBC: 3.28 MIL/uL — ABNORMAL LOW (ref 4.22–5.81)
RDW: 16.1 % — ABNORMAL HIGH (ref 11.5–15.5)
WBC Count: 4.6 10*3/uL (ref 4.0–10.5)
nRBC: 0 % (ref 0.0–0.2)

## 2018-04-14 LAB — CMP (CANCER CENTER ONLY)
ALT: 23 U/L (ref 10–47)
AST: 30 U/L (ref 11–38)
Albumin: 3.5 g/dL (ref 3.5–5.0)
Alkaline Phosphatase: 53 U/L (ref 26–84)
Anion gap: 4 — ABNORMAL LOW (ref 5–15)
BUN: 21 mg/dL (ref 7–22)
CO2: 31 mmol/L (ref 18–33)
Calcium: 9.8 mg/dL (ref 8.0–10.3)
Chloride: 103 mmol/L (ref 98–108)
Creatinine: 1.4 mg/dL — ABNORMAL HIGH (ref 0.60–1.20)
Glucose, Bld: 89 mg/dL (ref 73–118)
Potassium: 4.2 mmol/L (ref 3.3–4.7)
Sodium: 138 mmol/L (ref 128–145)
Total Bilirubin: 1 mg/dL (ref 0.2–1.6)
Total Protein: 6.8 g/dL (ref 6.4–8.1)

## 2018-04-14 MED ORDER — PROCHLORPERAZINE MALEATE 10 MG PO TABS: 10.0000 mg | ORAL_TABLET | Freq: Once | ORAL | Status: DC

## 2018-04-14 MED ORDER — BORTEZOMIB CHEMO SQ INJECTION 3.5 MG (2.5MG/ML)
1.3000 mg/m2 | Freq: Once | INTRAMUSCULAR | Status: AC
  Administered 2018-04-14: 2.75 mg via SUBCUTANEOUS
  Filled 2018-04-14: qty 1.1

## 2018-04-14 NOTE — Patient Instructions (Signed)
[facility] Discharge Instructions for Patients Receiving Chemotherapy  Today you received the following chemotherapy agents Velcade To help prevent nausea and vomiting after your treatment, we encourage you to take your nausea medication as prescribed.   If you develop nausea and vomiting that is not controlled by your nausea medication, call the clinic.   BELOW ARE SYMPTOMS THAT SHOULD BE REPORTED IMMEDIATELY:  *FEVER GREATER THAN 100.5 F  *CHILLS WITH OR WITHOUT FEVER  NAUSEA AND VOMITING THAT IS NOT CONTROLLED WITH YOUR NAUSEA MEDICATION  *UNUSUAL SHORTNESS OF BREATH  *UNUSUAL BRUISING OR BLEEDING  TENDERNESS IN MOUTH AND THROAT WITH OR WITHOUT PRESENCE OF ULCERS  *URINARY PROBLEMS  *BOWEL PROBLEMS  UNUSUAL RASH Items with * indicate a potential emergency and should be followed up as soon as possible.  Feel free to call the clinic should you have any questions or concerns. The clinic phone number is [phone].  Please show the CHEMO ALERT CARD at check-in to the Emergency Department and triage nurse.   

## 2018-04-28 ENCOUNTER — Inpatient Hospital Stay: Payer: Medicare Other

## 2018-04-28 ENCOUNTER — Encounter: Payer: Self-pay | Admitting: Hematology & Oncology

## 2018-04-28 ENCOUNTER — Other Ambulatory Visit: Payer: Self-pay

## 2018-04-28 ENCOUNTER — Inpatient Hospital Stay: Payer: Medicare Other | Attending: Hematology & Oncology | Admitting: Hematology & Oncology

## 2018-04-28 VITALS — BP 175/85 | HR 58 | Temp 98.4°F | Resp 17 | Wt 214.4 lb

## 2018-04-28 DIAGNOSIS — C9002 Multiple myeloma in relapse: Secondary | ICD-10-CM

## 2018-04-28 DIAGNOSIS — Z9484 Stem cells transplant status: Secondary | ICD-10-CM | POA: Diagnosis not present

## 2018-04-28 DIAGNOSIS — Z7982 Long term (current) use of aspirin: Secondary | ICD-10-CM | POA: Diagnosis not present

## 2018-04-28 DIAGNOSIS — Z5112 Encounter for antineoplastic immunotherapy: Secondary | ICD-10-CM | POA: Diagnosis not present

## 2018-04-28 DIAGNOSIS — Z79899 Other long term (current) drug therapy: Secondary | ICD-10-CM | POA: Insufficient documentation

## 2018-04-28 LAB — CMP (CANCER CENTER ONLY)
ALT: 15 U/L (ref 0–44)
AST: 21 U/L (ref 15–41)
Albumin: 3.8 g/dL (ref 3.5–5.0)
Alkaline Phosphatase: 47 U/L (ref 38–126)
Anion gap: 3 — ABNORMAL LOW (ref 5–15)
BUN: 17 mg/dL (ref 8–23)
CO2: 30 mmol/L (ref 22–32)
Calcium: 9.6 mg/dL (ref 8.9–10.3)
Chloride: 97 mmol/L — ABNORMAL LOW (ref 98–111)
Creatinine: 1.11 mg/dL (ref 0.61–1.24)
GFR, Est AFR Am: >60 mL/min (ref >60)
GFR, Estimated: >60 mL/min (ref >60)
Glucose, Bld: 102 mg/dL — ABNORMAL HIGH (ref 70–99)
Potassium: 3.8 mmol/L (ref 3.5–5.1)
Sodium: 130 mmol/L — ABNORMAL LOW (ref 135–145)
Total Bilirubin: 0.8 mg/dL (ref 0.3–1.2)
Total Protein: 6.6 g/dL (ref 6.5–8.1)

## 2018-04-28 LAB — CBC WITH DIFFERENTIAL (CANCER CENTER ONLY)
Abs Immature Granulocytes: 0.01 10*3/uL (ref 0.00–0.07)
Basophils Absolute: 0 10*3/uL (ref 0.0–0.1)
Basophils Relative: 0 %
Eosinophils Absolute: 0 10*3/uL (ref 0.0–0.5)
Eosinophils Relative: 1 %
HCT: 30.5 % — ABNORMAL LOW (ref 39.0–52.0)
Hemoglobin: 9.9 g/dL — ABNORMAL LOW (ref 13.0–17.0)
Immature Granulocytes: 0 %
Lymphocytes Relative: 30 %
Lymphs Abs: 1.4 10*3/uL (ref 0.7–4.0)
MCH: 32.5 pg (ref 26.0–34.0)
MCHC: 32.5 g/dL (ref 30.0–36.0)
MCV: 100 fL (ref 80.0–100.0)
Monocytes Absolute: 0.3 10*3/uL (ref 0.1–1.0)
Monocytes Relative: 7 %
Neutro Abs: 2.9 10*3/uL (ref 1.7–7.7)
Neutrophils Relative %: 62 %
Platelet Count: 145 10*3/uL — ABNORMAL LOW (ref 150–400)
RBC: 3.05 MIL/uL — ABNORMAL LOW (ref 4.22–5.81)
RDW: 16.5 % — ABNORMAL HIGH (ref 11.5–15.5)
WBC Count: 4.7 10*3/uL (ref 4.0–10.5)
nRBC: 0 % (ref 0.0–0.2)

## 2018-04-28 LAB — RETICULOCYTES
Immature Retic Fract: 7.6 % (ref 2.3–15.9)
RBC.: 3.05 MIL/uL — ABNORMAL LOW (ref 4.22–5.81)
Retic Count, Absolute: 24.1 10*3/uL (ref 19.0–186.0)
Retic Ct Pct: 0.8 % (ref 0.4–3.1)

## 2018-04-28 MED ORDER — PROCHLORPERAZINE MALEATE 10 MG PO TABS: 10.0000 mg | ORAL_TABLET | Freq: Once | ORAL | Status: DC

## 2018-04-28 MED ORDER — BORTEZOMIB CHEMO SQ INJECTION 3.5 MG (2.5MG/ML)
1.3000 mg/m2 | Freq: Once | INTRAMUSCULAR | Status: AC
  Administered 2018-04-28: 2.75 mg via SUBCUTANEOUS
  Filled 2018-04-28: qty 1.1

## 2018-04-28 NOTE — Progress Notes (Signed)
Hematology and Oncology Follow Up Visit  CHENEY EWART [id number] 1933-09-26 84 y.o. 04/28/2018   Principle Diagnosis:  IgG Kappa Myeloma-Relapsed -- Trisomy 67, 13q-  Current Therapy:   RVD - S/p cycle #2 -- revlimid on hold Zometa 4 mg IV q 110months - next dose on 07/2018   Interim History: Mr. Lastinger is here today for follow-up.  He seems to be doing a little bit better.  He is still off the Revlimid.  His blood counts of come up a little bit.  It is possible that we just might not be able to use Revlimid.  If not, then I would consider the CyBorD protocol.  This might be something that is more agreeable.  He as expected, ate quite a bit for Thanksgiving.  He still is not watching what he eats.  His wife is trying her best to get him to eat healthy.  His last myeloma studies back in October showed a M spike of 0.9 g/dL.  His IgG level was 1400 mg/dL.  His Kappa light chain  His IgG level is 1400 mg/dL.  His Kappa light chain was 12.6 mg/dL.  He has had no fever.  He has had no bleeding.  He has had no change in bowel or bladder habits.  Overall, his performance status is ECOG 1.   Medications:  Allergies as of 04/28/2018   No Known Allergies     Medication List        Accurate as of 04/28/18  1:32 PM. Always use your most recent med list.          aspirin EC 325 MG tablet Take 1 tablet (325 mg total) by mouth daily.   bimatoprost 0.01 % Soln Commonly known as:  LUMIGAN Place 1 drop into both eyes at bedtime.   CALTRATE 600+D PLUS PO Take 1 tablet by mouth daily.   carvedilol 25 MG tablet Commonly known as:  COREG Take 25 mg by mouth 2 (two) times daily with a meal.   cloNIDine 0.1 MG tablet Commonly known as:  CATAPRES Take 0.1 mg by mouth 2 (two) times daily as needed (elevated blood pressure >190/80).   [employer] VITAMIN B12 2000 MCG tablet Generic drug:  cyanocobalamin Take 2,000 mcg by mouth at bedtime.   dexamethasone 4 MG tablet Commonly known as:   DECADRON Take 5 tablets (20 mg total) by mouth once a week.   famciclovir 250 MG tablet Commonly known as:  FAMVIR Take 1 tablet (250 mg total) by mouth daily.   fluticasone 50 MCG/ACT nasal spray Commonly known as:  FLONASE Place 2 sprays into both nostrils 2 (two) times daily as needed (seasonal allergies).   hydrochlorothiazide 25 MG tablet Commonly known as:  HYDRODIURIL Take 25 mg by mouth daily.   lenalidomide 20 MG capsule Commonly known as:  REVLIMID Take 1 capsule daily at bedtime for 21 days on then 7 days off. [id number]   losartan 100 MG tablet Commonly known as:  COZAAR Take 100 mg by mouth daily.   multivitamin with minerals Tabs tablet Take 1 tablet by mouth daily.   potassium chloride SA 20 MEQ tablet Commonly known as:  K-DUR,KLOR-CON Take 20 mEq by mouth daily.   pravastatin 20 MG tablet Commonly known as:  PRAVACHOL Take 20 mg by mouth at bedtime.   PROAIR HFA 108 (90 Base) MCG/ACT inhaler Generic drug:  albuterol Inhale 2 puffs into the lungs every 6 (six) hours as needed for wheezing or shortness of breath.  prochlorperazine 10 MG tablet Commonly known as:  COMPAZINE Take 1 tablet (10 mg total) by mouth every 6 (six) hours as needed for nausea or vomiting.   SYSTANE OP Place 1 drop into both eyes daily as needed (dry eyes).   tadalafil 20 MG tablet Commonly known as:  ADCIRCA/CIALIS Take 20 mg by mouth daily as needed for erectile dysfunction.   Vitamin D3 25 MCG (1000 UT) Caps Take 1,000 Units by mouth daily.   ZOMETA 4 MG/100ML IVPB Generic drug:  Zoledronic Acid Inject 4 mg into the vein every 4 (four) months. Administered by Dr[facility] Jonette Eva at [facility] - last infusion mid July 2019       Allergies: No Known Allergies  Past Medical History, Surgical history, Social history, and Family History were reviewed and updated.  Review of Systems: Review of Systems  Constitutional: Positive for malaise/fatigue.  HENT:  Negative.   Eyes: Negative.   Respiratory: Positive for shortness of breath.   Cardiovascular: Positive for leg swelling.  Gastrointestinal: Negative.   Genitourinary: Negative.   Musculoskeletal: Negative.   Skin: Negative.   Neurological: Negative.   Endo/Heme/Allergies: Negative.   Psychiatric/Behavioral: Negative.      Physical Exam:  weight is 214 lb 6.4 oz (97.3 kg). His oral temperature is 98.4 F (36.9 C). His blood pressure is 175/85 (abnormal) and his pulse is 58 (abnormal). His respiration is 17 and oxygen saturation is 100%.   Wt Readings from Last 3 Encounters:  04/28/18 214 lb 6.4 oz (97.3 kg)  03/31/18 213 lb (96.6 kg)  03/17/18 215 lb (97.5 kg)    Physical Exam  Constitutional: He is oriented to person, place, and time.  HENT:  Head: Normocephalic and atraumatic.  Mouth/Throat: Oropharynx is clear and moist.  Eyes: Pupils are equal, round, and reactive to light. EOM are normal.  Neck: Normal range of motion.  Cardiovascular: Normal rate, regular rhythm and normal heart sounds.  Pulmonary/Chest: Effort normal and breath sounds normal.  Abdominal: Soft. Bowel sounds are normal.  Musculoskeletal: Normal range of motion. He exhibits no edema, tenderness or deformity.  Lymphadenopathy:    He has no cervical adenopathy.  Neurological: He is alert and oriented to person, place, and time.  Skin: Skin is warm and dry. No rash noted. No erythema.  Psychiatric: He has a normal mood and affect. His behavior is normal. Judgment and thought content normal.  Vitals reviewed.    Lab Results  Component Value Date   WBC 4.7 04/28/2018   HGB 9.9 (L) 04/28/2018   HCT 30.5 (L) 04/28/2018   MCV 100.0 04/28/2018   PLT 145 (L) 04/28/2018   Lab Results  Component Value Date   FERRITIN 388 (H) 03/31/2018   IRON 81 03/31/2018   TIBC 293 03/31/2018   UIBC 212 03/31/2018   IRONPCTSAT 28 03/31/2018   Lab Results  Component Value Date   RETICCTPCT 0.8 04/28/2018   RBC  3.05 (L) 04/28/2018   RBC 3.05 (L) 04/28/2018   Lab Results  Component Value Date   KPAFRELGTCHN 126.4 (H) 03/17/2018   LAMBDASER 21.8 03/17/2018   KAPLAMBRATIO 5.80 (H) 03/17/2018   Lab Results  Component Value Date   IGGSERUM 1,393 03/17/2018   IGGSERUM 1,397 03/17/2018   IGA 59 (L) 03/17/2018   IGA 67 03/17/2018   IGMSERUM 75 03/17/2018   IGMSERUM 79 03/17/2018   Lab Results  Component Value Date   TOTALPROTELP 6.5 03/17/2018   ALBUMINELP 3.4 03/17/2018   A1GS 0.2 03/17/2018  A2GS 0.7 03/17/2018   BETS 0.9 03/17/2018   BETA2SER 0.3 01/03/2015   GAMS 1.3 03/17/2018   MSPIKE 0.9 (H) 03/17/2018   SPEI * 01/03/2015     Chemistry      Component Value Date/Time   NA 138 04/14/2018 1306   NA 137 04/08/2017 1141   NA 138 10/29/2015 1029   K 4.2 04/14/2018 1306   K 3.4 04/08/2017 1141   K 3.9 10/29/2015 1029   CL 103 04/14/2018 1306   CL 102 04/08/2017 1141   CO2 31 04/14/2018 1306   CO2 30 04/08/2017 1141   CO2 27 10/29/2015 1029   BUN 21 04/14/2018 1306   BUN 16 04/08/2017 1141   BUN 13.8 10/29/2015 1029   CREATININE 1.40 (H) 04/14/2018 1306   CREATININE 1.3 (H) 04/08/2017 1141   CREATININE 1.1 10/29/2015 1029      Component Value Date/Time   CALCIUM 9.8 04/14/2018 1306   CALCIUM 9.3 04/08/2017 1141   CALCIUM 9.3 10/29/2015 1029   ALKPHOS 53 04/14/2018 1306   ALKPHOS 66 04/08/2017 1141   ALKPHOS 52 10/29/2015 1029   AST 30 04/14/2018 1306   AST 20 10/29/2015 1029   ALT 23 04/14/2018 1306   ALT 21 04/08/2017 1141   ALT 14 10/29/2015 1029   BILITOT 1.0 04/14/2018 1306   BILITOT 0.75 10/29/2015 1029      Impression and Plan: Mr. Suit is a very pleasant 84 yo gentleman with IgG kappa myeloma with history of stem cell transplant in 2006.   We will have to see what his myeloma studies are.  If I do not see a nice drop in his levels, then I probably will have to add Cytoxan.  Again, it might be that with his stem cell transplant that he had a while back,  his bone marrow just might not be able to tolerate Revlimid.  I guess we could try pomalidomide as another option.  We will plan to have him come back for his fifth cycle on 05/26/2018.    Volanda Napoleon, MD 12/5/20191:32 PM

## 2018-04-29 LAB — IGG, IGA, IGM
IgA: 39 mg/dL — ABNORMAL LOW (ref 61–437)
IgG (Immunoglobin G), Serum: 1728 mg/dL — ABNORMAL HIGH (ref 700–1600)
IgM (Immunoglobulin M), Srm: 53 mg/dL (ref 15–143)

## 2018-04-29 LAB — IRON AND TIBC
Iron: 84 ug/dL (ref 42–163)
Saturation Ratios: 31 % (ref 20–55)
TIBC: 276 ug/dL (ref 202–409)
UIBC: 192 ug/dL (ref 117–376)

## 2018-04-29 LAB — FERRITIN: Ferritin: 466 ng/mL — ABNORMAL HIGH (ref 24–336)

## 2018-04-29 LAB — KAPPA/LAMBDA LIGHT CHAINS
Kappa free light chain: 112.1 mg/L — ABNORMAL HIGH (ref 3.3–19.4)
Kappa, lambda light chain ratio: 10.78 — ABNORMAL HIGH (ref 0.26–1.65)
Lambda free light chains: 10.4 mg/L (ref 5.7–26.3)

## 2018-05-03 LAB — PROTEIN ELECTROPHORESIS, SERUM, WITH REFLEX
A/G Ratio: 1.2 (ref 0.7–1.7)
Albumin ELP: 3.6 g/dL (ref 2.9–4.4)
Alpha-1-Globulin: 0.2 g/dL (ref 0.0–0.4)
Alpha-2-Globulin: 0.5 g/dL (ref 0.4–1.0)
Beta Globulin: 0.8 g/dL (ref 0.7–1.3)
Gamma Globulin: 1.5 g/dL (ref 0.4–1.8)
Globulin, Total: 3 g/dL (ref 2.2–3.9)
M-Spike, %: 1.3 g/dL — ABNORMAL HIGH
SPEP Interpretation: 0
Total Protein ELP: 6.6 g/dL (ref 6.0–8.5)

## 2018-05-03 LAB — IMMUNOFIXATION REFLEX, SERUM
IgA: 40 mg/dL — ABNORMAL LOW (ref 61–437)
IgG (Immunoglobin G), Serum: 1714 mg/dL — ABNORMAL HIGH (ref 700–1600)
IgM (Immunoglobulin M), Srm: 53 mg/dL (ref 15–143)

## 2018-05-04 ENCOUNTER — Other Ambulatory Visit: Payer: Self-pay | Admitting: *Deleted

## 2018-05-04 DIAGNOSIS — C9002 Multiple myeloma in relapse: Secondary | ICD-10-CM

## 2018-05-05 ENCOUNTER — Inpatient Hospital Stay: Payer: Medicare Other

## 2018-05-05 VITALS — BP 184/81 | HR 50 | Temp 97.6°F | Resp 20

## 2018-05-05 DIAGNOSIS — Z5112 Encounter for antineoplastic immunotherapy: Secondary | ICD-10-CM | POA: Diagnosis not present

## 2018-05-05 DIAGNOSIS — Z79899 Other long term (current) drug therapy: Secondary | ICD-10-CM | POA: Diagnosis not present

## 2018-05-05 DIAGNOSIS — Z7982 Long term (current) use of aspirin: Secondary | ICD-10-CM | POA: Diagnosis not present

## 2018-05-05 DIAGNOSIS — C9002 Multiple myeloma in relapse: Secondary | ICD-10-CM

## 2018-05-05 DIAGNOSIS — Z9484 Stem cells transplant status: Secondary | ICD-10-CM | POA: Diagnosis not present

## 2018-05-05 LAB — CBC WITH DIFFERENTIAL (CANCER CENTER ONLY)
Abs Immature Granulocytes: 0.01 10*3/uL (ref 0.00–0.07)
Basophils Absolute: 0 10*3/uL (ref 0.0–0.1)
Basophils Relative: 0 %
Eosinophils Absolute: 0 10*3/uL (ref 0.0–0.5)
Eosinophils Relative: 1 %
HCT: 31.3 % — ABNORMAL LOW (ref 39.0–52.0)
Hemoglobin: 10.2 g/dL — ABNORMAL LOW (ref 13.0–17.0)
Immature Granulocytes: 0 %
Lymphocytes Relative: 42 %
Lymphs Abs: 1.9 10*3/uL (ref 0.7–4.0)
MCH: 32.4 pg (ref 26.0–34.0)
MCHC: 32.6 g/dL (ref 30.0–36.0)
MCV: 99.4 fL (ref 80.0–100.0)
Monocytes Absolute: 0.5 10*3/uL (ref 0.1–1.0)
Monocytes Relative: 12 %
Neutro Abs: 2.1 10*3/uL (ref 1.7–7.7)
Neutrophils Relative %: 45 %
Platelet Count: 144 10*3/uL — ABNORMAL LOW (ref 150–400)
RBC: 3.15 MIL/uL — ABNORMAL LOW (ref 4.22–5.81)
RDW: 16.8 % — ABNORMAL HIGH (ref 11.5–15.5)
WBC Count: 4.6 10*3/uL (ref 4.0–10.5)
nRBC: 0 % (ref 0.0–0.2)

## 2018-05-05 LAB — COMPREHENSIVE METABOLIC PANEL
ALT: 16 U/L (ref 0–44)
AST: 21 U/L (ref 15–41)
Albumin: 3.8 g/dL (ref 3.5–5.0)
Alkaline Phosphatase: 47 U/L (ref 38–126)
Anion gap: 6 (ref 5–15)
BUN: 19 mg/dL (ref 8–23)
CO2: 30 mmol/L (ref 22–32)
Calcium: 9.6 mg/dL (ref 8.9–10.3)
Chloride: 97 mmol/L — ABNORMAL LOW (ref 98–111)
Creatinine, Ser: 1.1 mg/dL (ref 0.61–1.24)
GFR calc Af Amer: >60 mL/min (ref >60)
GFR calc non Af Amer: >60 mL/min (ref >60)
Glucose, Bld: 90 mg/dL (ref 70–99)
Potassium: 4.1 mmol/L (ref 3.5–5.1)
Sodium: 133 mmol/L — ABNORMAL LOW (ref 135–145)
Total Bilirubin: 0.8 mg/dL (ref 0.3–1.2)
Total Protein: 6.5 g/dL (ref 6.5–8.1)

## 2018-05-05 MED ORDER — BORTEZOMIB CHEMO SQ INJECTION 3.5 MG (2.5MG/ML)
1.3000 mg/m2 | Freq: Once | INTRAMUSCULAR | Status: AC
  Administered 2018-05-05: 2.75 mg via SUBCUTANEOUS
  Filled 2018-05-05: qty 1.1

## 2018-05-05 MED ORDER — PROCHLORPERAZINE MALEATE 10 MG PO TABS: 10.0000 mg | ORAL_TABLET | Freq: Once | ORAL | Status: DC

## 2018-05-05 NOTE — Patient Instructions (Signed)

## 2018-05-11 ENCOUNTER — Other Ambulatory Visit: Payer: Self-pay | Admitting: *Deleted

## 2018-05-11 DIAGNOSIS — C9002 Multiple myeloma in relapse: Secondary | ICD-10-CM

## 2018-05-12 ENCOUNTER — Inpatient Hospital Stay: Payer: Medicare Other

## 2018-05-12 ENCOUNTER — Encounter: Payer: Self-pay | Admitting: Hematology & Oncology

## 2018-05-12 ENCOUNTER — Other Ambulatory Visit: Payer: Self-pay

## 2018-05-12 ENCOUNTER — Inpatient Hospital Stay (HOSPITAL_BASED_OUTPATIENT_CLINIC_OR_DEPARTMENT_OTHER): Payer: Medicare Other | Admitting: Hematology & Oncology

## 2018-05-12 VITALS — BP 193/90 | HR 53 | Temp 97.6°F | Resp 18

## 2018-05-12 DIAGNOSIS — Z7982 Long term (current) use of aspirin: Secondary | ICD-10-CM | POA: Diagnosis not present

## 2018-05-12 DIAGNOSIS — C9002 Multiple myeloma in relapse: Secondary | ICD-10-CM | POA: Diagnosis not present

## 2018-05-12 DIAGNOSIS — Z9484 Stem cells transplant status: Secondary | ICD-10-CM

## 2018-05-12 DIAGNOSIS — Z7189 Other specified counseling: Secondary | ICD-10-CM

## 2018-05-12 DIAGNOSIS — Z79899 Other long term (current) drug therapy: Secondary | ICD-10-CM

## 2018-05-12 DIAGNOSIS — Z5112 Encounter for antineoplastic immunotherapy: Secondary | ICD-10-CM | POA: Diagnosis not present

## 2018-05-12 HISTORY — DX: Other specified counseling: Z71.89

## 2018-05-12 LAB — CMP (CANCER CENTER ONLY)
ALT: 14 U/L (ref 0–44)
AST: 19 U/L (ref 15–41)
Albumin: 3.6 g/dL (ref 3.5–5.0)
Alkaline Phosphatase: 46 U/L (ref 38–126)
Anion gap: 5 (ref 5–15)
BUN: 23 mg/dL (ref 8–23)
CO2: 29 mmol/L (ref 22–32)
Calcium: 9.3 mg/dL (ref 8.9–10.3)
Chloride: 99 mmol/L (ref 98–111)
Creatinine: 1.17 mg/dL (ref 0.61–1.24)
GFR, Est AFR Am: >60 mL/min (ref >60)
GFR, Estimated: 58 mL/min — ABNORMAL LOW (ref >60)
Glucose, Bld: 91 mg/dL (ref 70–99)
Potassium: 4.4 mmol/L (ref 3.5–5.1)
Sodium: 133 mmol/L — ABNORMAL LOW (ref 135–145)
Total Bilirubin: 0.7 mg/dL (ref 0.3–1.2)
Total Protein: 6 g/dL — ABNORMAL LOW (ref 6.5–8.1)

## 2018-05-12 LAB — CBC WITH DIFFERENTIAL (CANCER CENTER ONLY)
Abs Immature Granulocytes: 0.02 10*3/uL (ref 0.00–0.07)
Basophils Absolute: 0 10*3/uL (ref 0.0–0.1)
Basophils Relative: 0 %
Eosinophils Absolute: 0.1 10*3/uL (ref 0.0–0.5)
Eosinophils Relative: 1 %
HCT: 30.3 % — ABNORMAL LOW (ref 39.0–52.0)
Hemoglobin: 9.6 g/dL — ABNORMAL LOW (ref 13.0–17.0)
Immature Granulocytes: 0 %
Lymphocytes Relative: 40 %
Lymphs Abs: 2 10*3/uL (ref 0.7–4.0)
MCH: 32.1 pg (ref 26.0–34.0)
MCHC: 31.7 g/dL (ref 30.0–36.0)
MCV: 101.3 fL — ABNORMAL HIGH (ref 80.0–100.0)
Monocytes Absolute: 0.5 10*3/uL (ref 0.1–1.0)
Monocytes Relative: 9 %
Neutro Abs: 2.5 10*3/uL (ref 1.7–7.7)
Neutrophils Relative %: 50 %
Platelet Count: 131 10*3/uL — ABNORMAL LOW (ref 150–400)
RBC: 2.99 MIL/uL — ABNORMAL LOW (ref 4.22–5.81)
RDW: 17.1 % — ABNORMAL HIGH (ref 11.5–15.5)
WBC Count: 5 10*3/uL (ref 4.0–10.5)
nRBC: 0 % (ref 0.0–0.2)

## 2018-05-12 NOTE — Progress Notes (Signed)
Hematology and Oncology Follow Up Visit  CATRELL MORRONE [id number] [birth year] 84 y.o. 05/12/2018   Principle Diagnosis:  IgG Kappa Myeloma-Relapsed -- Trisomy 14, 13q-  Current Therapy:   RVD - S/p cycle #3 -- revlimid on hold -- d/c on 05/12/2018 CarCytoDec -- start cycle #1 on 06/02/2018 Zometa 4 mg IV q 67months - next dose on 07/2018   Interim History: Mr. Greenslade is here today for follow-up.  Unfortunately, I think it is apparent now that he is not responding to the RVD.  His last M spike was up to 1.3 g/dL.  It had been 0.9 g/dL.  His IgG level was 1730 mg/dL.  His kappa light chain was 11.2 mg/dL.  I think that his blood counts are not responding as I thought because of the myeloma not responding to therapy.  I am surprised by this.  It is been a long time since he had his stem cell transplant.  Is probably been at least 10 or 11 years.  I really think that we have to make a change.  As such, I would favor switch him over to Kyprolis/Cytoxan/Decadron.  I think this would be a very reasonable protocol for him.  I think it would be easy on the bone marrow.  He is 84 years old so I would not consider him a candidate for stem cell transplant.  He will need to have a Port-A-Cath placed.  He is not too happy about this.  However, I told him that I think the Port-A-Cath would make life a lot easier for him.  I reviewed the labs with he and his wife.  I showed him my concern.  I explained to him why I thought things were not going in the right direction.  He does understand this.  He did have a nice Thanksgiving.  As always, he is eating way too much.  His weight is up a little bit more.  He is having no problems with bowels or bladder.  He is had no leg swelling.  He has had no rashes.  Overall, his performance status is ECOG 1.    Medications:  Allergies as of 05/12/2018   No Known Allergies     Medication List       Accurate as of May 12, 2018  4:37 PM. Always use your  most recent med list.        aspirin EC 325 MG tablet Take 1 tablet (325 mg total) by mouth daily.   bimatoprost 0.01 % Soln Commonly known as:  LUMIGAN Place 1 drop into both eyes at bedtime.   CALTRATE 600+D PLUS PO Take 1 tablet by mouth daily.   carvedilol 25 MG tablet Commonly known as:  COREG Take 25 mg by mouth 2 (two) times daily with a meal.   cloNIDine 0.1 MG tablet Commonly known as:  CATAPRES Take 0.1 mg by mouth 2 (two) times daily as needed (elevated blood pressure >190/80).   [employer] VITAMIN B12 2000 MCG tablet Generic drug:  cyanocobalamin Take 2,000 mcg by mouth at bedtime.   dexamethasone 4 MG tablet Commonly known as:  DECADRON Take 5 tablets (20 mg total) by mouth once a week.   famciclovir 250 MG tablet Commonly known as:  FAMVIR Take 1 tablet (250 mg total) by mouth daily.   hydrochlorothiazide 25 MG tablet Commonly known as:  HYDRODIURIL Take 25 mg by mouth daily.   losartan 100 MG tablet Commonly known as:  COZAAR Take 100 mg by  mouth daily.   multivitamin with minerals Tabs tablet Take 1 tablet by mouth daily.   potassium chloride SA 20 MEQ tablet Commonly known as:  K-DUR,KLOR-CON Take 20 mEq by mouth daily.   pravastatin 20 MG tablet Commonly known as:  PRAVACHOL Take 20 mg by mouth at bedtime.   prochlorperazine 10 MG tablet Commonly known as:  COMPAZINE Take 1 tablet (10 mg total) by mouth every 6 (six) hours as needed for nausea or vomiting.   SYSTANE OP Place 1 drop into both eyes daily as needed (dry eyes).   tadalafil 20 MG tablet Commonly known as:  ADCIRCA/CIALIS Take 20 mg by mouth daily as needed for erectile dysfunction.   Vitamin D3 25 MCG (1000 UT) Caps Take 1,000 Units by mouth daily.   ZOMETA 4 MG/100ML IVPB Generic drug:  Zoledronic Acid Inject 4 mg into the vein every 4 (four) months. Administered by Dr. Jonette Eva at [facility] - last infusion mid July 2019       Allergies: No Known  Allergies  Past Medical History, Surgical history, Social history, and Family History were reviewed and updated.  Review of Systems: Review of Systems  Constitutional: Positive for malaise/fatigue.  HENT: Negative.   Eyes: Negative.   Respiratory: Positive for shortness of breath.   Cardiovascular: Positive for leg swelling.  Gastrointestinal: Negative.   Genitourinary: Negative.   Musculoskeletal: Negative.   Skin: Negative.   Neurological: Negative.   Endo/Heme/Allergies: Negative.   Psychiatric/Behavioral: Negative.      Physical Exam:  oral temperature is 97.6 F (36.4 C). His blood pressure is 193/90 (abnormal) and his pulse is 53 (abnormal). His respiration is 18 and oxygen saturation is 100%.   Wt Readings from Last 3 Encounters:  04/28/18 214 lb 6.4 oz (97.3 kg)  03/31/18 213 lb (96.6 kg)  03/17/18 215 lb (97.5 kg)    Physical Exam Vitals signs reviewed.  HENT:     Head: Normocephalic and atraumatic.  Eyes:     Pupils: Pupils are equal, round, and reactive to light.  Neck:     Musculoskeletal: Normal range of motion.  Cardiovascular:     Rate and Rhythm: Normal rate and regular rhythm.     Heart sounds: Normal heart sounds.  Pulmonary:     Effort: Pulmonary effort is normal.     Breath sounds: Normal breath sounds.  Abdominal:     General: Bowel sounds are normal.     Palpations: Abdomen is soft.  Musculoskeletal: Normal range of motion.        General: No tenderness or deformity.  Lymphadenopathy:     Cervical: No cervical adenopathy.  Skin:    General: Skin is warm and dry.     Findings: No erythema or rash.  Neurological:     Mental Status: He is alert and oriented to person, place, and time.  Psychiatric:        Behavior: Behavior normal.        Thought Content: Thought content normal.        Judgment: Judgment normal.      Lab Results  Component Value Date   WBC 5.0 05/12/2018   HGB 9.6 (L) 05/12/2018   HCT 30.3 (L) 05/12/2018   MCV  101.3 (H) 05/12/2018   PLT 131 (L) 05/12/2018   Lab Results  Component Value Date   FERRITIN 466 (H) 04/28/2018   IRON 84 04/28/2018   TIBC 276 04/28/2018   UIBC 192 04/28/2018   IRONPCTSAT 31  04/28/2018   Lab Results  Component Value Date   RETICCTPCT 0.8 04/28/2018   RBC 2.99 (L) 05/12/2018   Lab Results  Component Value Date   KPAFRELGTCHN 112.1 (H) 04/28/2018   LAMBDASER 10.4 04/28/2018   KAPLAMBRATIO 10.78 (H) 04/28/2018   Lab Results  Component Value Date   IGGSERUM 1,714 (H) 04/28/2018   IGA 40 (L) 04/28/2018   IGMSERUM 53 04/28/2018   Lab Results  Component Value Date   TOTALPROTELP 6.6 04/28/2018   ALBUMINELP 3.6 04/28/2018   A1GS 0.2 04/28/2018   A2GS 0.5 04/28/2018   BETS 0.8 04/28/2018   BETA2SER 0.3 01/03/2015   GAMS 1.5 04/28/2018   MSPIKE 1.3 (H) 04/28/2018   SPEI * 01/03/2015     Chemistry      Component Value Date/Time   NA 133 (L) 05/12/2018 1336   NA 137 04/08/2017 1141   NA 138 10/29/2015 1029   K 4.4 05/12/2018 1336   K 3.4 04/08/2017 1141   K 3.9 10/29/2015 1029   CL 99 05/12/2018 1336   CL 102 04/08/2017 1141   CO2 29 05/12/2018 1336   CO2 30 04/08/2017 1141   CO2 27 10/29/2015 1029   BUN 23 05/12/2018 1336   BUN 16 04/08/2017 1141   BUN 13.8 10/29/2015 1029   CREATININE 1.17 05/12/2018 1336   CREATININE 1.3 (H) 04/08/2017 1141   CREATININE 1.1 10/29/2015 1029      Component Value Date/Time   CALCIUM 9.3 05/12/2018 1336   CALCIUM 9.3 04/08/2017 1141   CALCIUM 9.3 10/29/2015 1029   ALKPHOS 46 05/12/2018 1336   ALKPHOS 66 04/08/2017 1141   ALKPHOS 52 10/29/2015 1029   AST 19 05/12/2018 1336   AST 20 10/29/2015 1029   ALT 14 05/12/2018 1336   ALT 21 04/08/2017 1141   ALT 14 10/29/2015 1029   BILITOT 0.7 05/12/2018 1336   BILITOT 0.75 10/29/2015 1029      Impression and Plan: Mr. Novosad is a very pleasant 84 yo gentleman with IgG kappa myeloma with history of stem cell transplant in 2006.   We will start treatment after  the holiday season.  I think this would be reasonable.  I really think it would be beneficial for him not to have treatment during the Christmas and New Year's holiday.  He will have the Port-A-Cath placed the first week of January.  I spent about 50 minutes with he and his wife.  All the time spent face-to-face with them.  I reviewed the lab work.  I answered their questions.  I had to spend time setting up his protocol and setting up his appointments with radiology for the Port-A-Cath..    Of note, he had an echocardiogram in August so I do not think we need to do another one on him.  Volanda Napoleon, MD 12/19/20194:37 PM

## 2018-05-12 NOTE — Progress Notes (Signed)
DISCONTINUE ON PATHWAY REGIMEN - Multiple Myeloma and Other Plasma Cell Dyscrasias     A cycle is every 21 days:     Bortezomib      Lenalidomide      Dexamethasone   **Always confirm dose/schedule in your pharmacy ordering system**  REASON: Disease Progression PRIOR TREATMENT: [id number]: VRd (Bortezomib 1.3 mg/m2 Subcut D1, 8, 15 + Lenalidomide 25 mg + Dexamethasone 40 mg) q21 Days x 8 Cycles TREATMENT RESPONSE: Progressive Disease (PD)  START OFF PATHWAY REGIMEN - Multiple Myeloma and Other Plasma Cell Dyscrasias   [id number] - Weekly (Carfilzomib 20/70 mg/m2 + Dexamethasone 40 mg) q28 Days:   A cycle is every 28 days:     Carfilzomib      Carfilzomib      Carfilzomib      Dexamethasone      Dexamethasone   **Always confirm dose/schedule in your pharmacy ordering system**  Patient Characteristics: Newly Diagnosed, Transplant Ineligible or Refused, High Risk R-ISS Staging: III Disease Classification: Newly Diagnosed Is Patient Eligible for Transplant<= Transplant Ineligible or Refused Risk Status: High Risk Intent of Therapy: Non-Curative / Palliative Intent, Discussed with Patient

## 2018-05-26 ENCOUNTER — Telehealth: Payer: Self-pay | Admitting: Hematology & Oncology

## 2018-05-26 ENCOUNTER — Other Ambulatory Visit: Payer: TRICARE For Life (TFL)

## 2018-05-26 ENCOUNTER — Ambulatory Visit: Payer: TRICARE For Life (TFL) | Admitting: Hematology & Oncology

## 2018-05-26 ENCOUNTER — Inpatient Hospital Stay: Payer: TRICARE For Life (TFL)

## 2018-05-26 NOTE — Telephone Encounter (Signed)
LMVM for patient regarding appointment time changes for 06/02/2018. I asked him to call back if he had any questions

## 2018-05-27 ENCOUNTER — Other Ambulatory Visit: Payer: Self-pay | Admitting: Radiology

## 2018-05-30 ENCOUNTER — Encounter (HOSPITAL_COMMUNITY): Payer: Self-pay

## 2018-05-30 ENCOUNTER — Ambulatory Visit (HOSPITAL_COMMUNITY)
Admission: RE | Admit: 2018-05-30 | Discharge: 2018-05-30 | Disposition: A | Discharge status: Home / Self Care | Payer: Medicare Other | Source: Ambulatory Visit | Attending: Hematology & Oncology | Admitting: Hematology & Oncology

## 2018-05-30 ENCOUNTER — Other Ambulatory Visit: Payer: Self-pay | Admitting: Hematology & Oncology

## 2018-05-30 DIAGNOSIS — I10 Essential (primary) hypertension: Secondary | ICD-10-CM | POA: Diagnosis not present

## 2018-05-30 DIAGNOSIS — M1990 Unspecified osteoarthritis, unspecified site: Secondary | ICD-10-CM | POA: Diagnosis not present

## 2018-05-30 DIAGNOSIS — Z79899 Other long term (current) drug therapy: Secondary | ICD-10-CM | POA: Insufficient documentation

## 2018-05-30 DIAGNOSIS — Z452 Encounter for adjustment and management of vascular access device: Secondary | ICD-10-CM | POA: Diagnosis not present

## 2018-05-30 DIAGNOSIS — C9000 Multiple myeloma not having achieved remission: Secondary | ICD-10-CM | POA: Diagnosis not present

## 2018-05-30 DIAGNOSIS — Z7982 Long term (current) use of aspirin: Secondary | ICD-10-CM | POA: Diagnosis not present

## 2018-05-30 DIAGNOSIS — C9002 Multiple myeloma in relapse: Secondary | ICD-10-CM

## 2018-05-30 DIAGNOSIS — E785 Hyperlipidemia, unspecified: Secondary | ICD-10-CM | POA: Diagnosis not present

## 2018-05-30 DIAGNOSIS — Z5111 Encounter for antineoplastic chemotherapy: Secondary | ICD-10-CM | POA: Diagnosis not present

## 2018-05-30 HISTORY — PX: IR IMAGING GUIDED PORT INSERTION: IMG5740

## 2018-05-30 LAB — CBC WITH DIFFERENTIAL/PLATELET
Abs Immature Granulocytes: 0.01 10*3/uL (ref 0.00–0.07)
Basophils Absolute: 0 10*3/uL (ref 0.0–0.1)
Basophils Relative: 0 %
Eosinophils Absolute: 0 10*3/uL (ref 0.0–0.5)
Eosinophils Relative: 1 %
HCT: 32.5 % — ABNORMAL LOW (ref 39.0–52.0)
Hemoglobin: 10.5 g/dL — ABNORMAL LOW (ref 13.0–17.0)
Immature Granulocytes: 0 %
Lymphocytes Relative: 44 %
Lymphs Abs: 1.9 10*3/uL (ref 0.7–4.0)
MCH: 33.8 pg (ref 26.0–34.0)
MCHC: 32.3 g/dL (ref 30.0–36.0)
MCV: 104.5 fL — ABNORMAL HIGH (ref 80.0–100.0)
Monocytes Absolute: 0.4 10*3/uL (ref 0.1–1.0)
Monocytes Relative: 10 %
Neutro Abs: 1.9 10*3/uL (ref 1.7–7.7)
Neutrophils Relative %: 45 %
Platelets: 181 10*3/uL (ref 150–400)
RBC: 3.11 MIL/uL — ABNORMAL LOW (ref 4.22–5.81)
RDW: 17.6 % — ABNORMAL HIGH (ref 11.5–15.5)
WBC: 4.3 10*3/uL (ref 4.0–10.5)
nRBC: 0 % (ref 0.0–0.2)

## 2018-05-30 LAB — PROTIME-INR
INR: 0.93
Prothrombin Time: 12.4 seconds (ref 11.4–15.2)

## 2018-05-30 MED ORDER — FENTANYL CITRATE (PF) 100 MCG/2ML IJ SOLN
INTRAMUSCULAR | Status: AC | PRN
  Administered 2018-05-30 (×2): 50 ug via INTRAVENOUS

## 2018-05-30 MED ORDER — HEPARIN SOD (PORK) LOCK FLUSH 100 UNIT/ML IV SOLN
INTRAVENOUS | Status: AC | PRN
  Administered 2018-05-30: 500 [IU] via INTRAVENOUS

## 2018-05-30 MED ORDER — LIDOCAINE HCL (PF) 1 % IJ SOLN
INTRAMUSCULAR | Status: AC | PRN
  Administered 2018-05-30: 5 mL

## 2018-05-30 MED ORDER — CEFAZOLIN SODIUM-DEXTROSE 2-4 GM/100ML-% IV SOLN
INTRAVENOUS | Status: AC
  Administered 2018-05-30: 2 g via INTRAVENOUS
  Filled 2018-05-30: qty 100

## 2018-05-30 MED ORDER — CEFAZOLIN SODIUM-DEXTROSE 2-4 GM/100ML-% IV SOLN
2.0000 g | INTRAVENOUS | Status: AC
  Administered 2018-05-30: 2 g via INTRAVENOUS

## 2018-05-30 MED ORDER — LIDOCAINE-EPINEPHRINE (PF) 2 %-1:200000 IJ SOLN
INTRAMUSCULAR | Status: AC
  Filled 2018-05-30: qty 20

## 2018-05-30 MED ORDER — HEPARIN SOD (PORK) LOCK FLUSH 100 UNIT/ML IV SOLN
INTRAVENOUS | Status: AC
  Filled 2018-05-30: qty 5

## 2018-05-30 MED ORDER — SODIUM CHLORIDE 0.9 % IV SOLN
INTRAVENOUS | Status: DC
  Administered 2018-05-30: 13:00:00 via INTRAVENOUS

## 2018-05-30 MED ORDER — MIDAZOLAM HCL 2 MG/2ML IJ SOLN
INTRAMUSCULAR | Status: AC | PRN
  Administered 2018-05-30 (×2): 1 mg via INTRAVENOUS

## 2018-05-30 MED ORDER — MIDAZOLAM HCL 2 MG/2ML IJ SOLN
INTRAMUSCULAR | Status: AC
  Filled 2018-05-30: qty 4

## 2018-05-30 MED ORDER — FENTANYL CITRATE (PF) 100 MCG/2ML IJ SOLN
INTRAMUSCULAR | Status: AC
  Filled 2018-05-30: qty 2

## 2018-05-30 MED ORDER — LIDOCAINE HCL (PF) 1 % IJ SOLN
INTRAMUSCULAR | Status: AC | PRN
  Administered 2018-05-30: 10 mL

## 2018-05-30 NOTE — Procedures (Signed)
Interventional Radiology Procedure Note  Procedure: Placement of a right IJ approach single lumen PowerPort.  Tip is positioned at the superior cavoatrial junction and catheter is ready for immediate use.  Complications: No immediate Recommendations:  - Ok to shower tomorrow - Do not submerge for 7 days - Routine line care   Signed,  Heath K. McCullough, MD   

## 2018-05-30 NOTE — Discharge Instructions (Signed)
Moderate Conscious Sedation, Adult, Care After °These instructions provide you with information about caring for yourself after your procedure. Your health care provider may also give you more specific instructions. Your treatment has been planned according to current medical practices, but problems sometimes occur. Call your health care provider if you have any problems or questions after your procedure. °What can I expect after the procedure? °After your procedure, it is common: °· To feel sleepy for several hours. °· To feel clumsy and have poor balance for several hours. °· To have poor judgment for several hours. °· To vomit if you eat too soon. °Follow these instructions at home: °For at least 24 hours after the procedure: ° °· Do not: °? Participate in activities where you could fall or become injured. °? Drive. °? Use heavy machinery. °? Drink alcohol. °? Take sleeping pills or medicines that cause drowsiness. °? Make important decisions or sign legal documents. °? Take care of children on your own. °· Rest. °Eating and drinking °· Follow the diet recommended by your health care provider. °· If you vomit: °? Drink water, juice, or soup when you can drink without vomiting. °? Make sure you have little or no nausea before eating solid foods. °General instructions °· Have a responsible adult stay with you until you are awake and alert. °· Take over-the-counter and prescription medicines only as told by your health care provider. °· If you smoke, do not smoke without supervision. °· Keep all follow-up visits as told by your health care provider. This is important. °Contact a health care provider if: °· You keep feeling nauseous or you keep vomiting. °· You feel light-headed. °· You develop a rash. °· You have a fever. °Get help right away if: °· You have trouble breathing. °This information is not intended to replace advice given to you by your health care provider. Make sure you discuss any questions you have  with your health care provider. °Document Released: 03/01/2013 Document Revised: 10/14/2015 Document Reviewed: 08/31/2015 °Elsevier Interactive Patient Education © 2019 [employer] ° ° °You may shower and remove your dressing tomorrow.  DO NOT use EMLA cream for 2 weeks after port placement as this cream will remove surgical glue on your incision. ° ° °Implanted Port Insertion, Care After °This sheet gives you information about how to care for yourself after your procedure. Your health care provider may also give you more specific instructions. If you have problems or questions, contact your health care provider. °What can I expect after the procedure? °After the procedure, it is common to have: °· Discomfort at the port insertion site. °· Bruising on the skin over the port. This should improve over 3-4 days. °Follow these instructions at home: °Port care °· After your port is placed, you will get a manufacturer's information card. The card has information about your port. Keep this card with you at all times. °· Take care of the port as told by your health care provider. Ask your health care provider if you or a family member can get training for taking care of the port at home. A home health care nurse may also take care of the port. °· Make sure to remember what type of port you have. °Incision care ° °  ° °· Follow instructions from your health care provider about how to take care of your port insertion site. Make sure you: °? Wash your hands with soap and water before and after you change your bandage (dressing). If   soap and water are not available, use hand sanitizer. °? Change your dressing as told by your health care provider. °? Leave stitches (sutures), skin glue, or adhesive strips in place. These skin closures may need to stay in place for 2 weeks or longer. If adhesive strip edges start to loosen and curl up, you may trim the loose edges. Do not remove adhesive strips completely unless your health  care provider tells you to do that. °· Check your port insertion site every day for signs of infection. Check for: °? Redness, swelling, or pain. °? Fluid or blood. °? Warmth. °? Pus or a bad smell. °Activity °· Return to your normal activities as told by your health care provider. Ask your health care provider what activities are safe for you. °· Do not lift anything that is heavier than 10 lb (4.5 kg), or the limit that you are told, until your health care provider says that it is safe. °General instructions °· Take over-the-counter and prescription medicines only as told by your health care provider. °· Do not take baths, swim, or use a hot tub until your health care provider approves. Ask your health care provider if you may take showers. You may only be allowed to take sponge baths. °· Do not drive for 24 hours if you were given a sedative during your procedure. °· Wear a medical alert bracelet in case of an emergency. This will tell any health care providers that you have a port. °· Keep all follow-up visits as told by your health care provider. This is important. °Contact a health care provider if: °· You cannot flush your port with saline as directed, or you cannot draw blood from the port. °· You have a fever or chills. °· You have redness, swelling, or pain around your port insertion site. °· You have fluid or blood coming from your port insertion site. °· Your port insertion site feels warm to the touch. °· You have pus or a bad smell coming from the port insertion site. °Get help right away if: °· You have chest pain or shortness of breath. °· You have bleeding from your port that you cannot control. °Summary °· Take care of the port as told by your health care provider. Keep the manufacturer's information card with you at all times. °· Change your dressing as told by your health care provider. °· Contact a health care provider if you have a fever or chills or if you have redness, swelling, or pain  around your port insertion site. °· Keep all follow-up visits as told by your health care provider. °This information is not intended to replace advice given to you by your health care provider. Make sure you discuss any questions you have with your health care provider. °Document Released: 03/01/2013 Document Revised: 12/07/2017 Document Reviewed: 12/07/2017 °Elsevier Interactive Patient Education © 2019 [employer] ° °

## 2018-05-30 NOTE — H&P (Signed)
Referring Physician(s): Volanda Napoleon  Supervising Physician: Jacqulynn Cadet  Patient Status:  [facility]  Chief Complaint:  "I'm getting another port a cath"  Subjective: Patient familiar to IR service from prior right chest wall Port-A-Cath placement in 2006 as well as  bone marrow biopsy on 12/24/2017.  He has a history of relapsing multiple myeloma and presents again today for new Port-A-Cath placement for chemotherapy.  He currently denies fever, headache, chest pain, dyspnea, cough, abdominal/back pain, nausea, vomiting or bleeding.  Past Medical History:  Diagnosis Date  . Arthritis   . Goals of care, counseling/discussion 05/12/2018  . Hyperlipidemia   . Hypertension   . Multiple myeloma ([place])    2006   Past Surgical History:  Procedure Laterality Date  . CATARACT EXTRACTION BILATERAL W/ ANTERIOR VITRECTOMY    . LIMBAL STEM CELL TRANSPLANT        Allergies: Patient has no known allergies.  Medications: Prior to Admission medications   Medication Sig Start Date End Date Taking? Authorizing Provider  bimatoprost (LUMIGAN) 0.01 % SOLN Place 1 drop into both eyes at bedtime.  12/07/11  Yes [provider]  Calcium Carbonate-Vit D-Min (CALTRATE 600+D PLUS PO) Take 1 tablet by mouth daily.   Yes [provider]  carvedilol (COREG) 25 MG tablet Take 25 mg by mouth 2 (two) times daily with a meal.   Yes [provider]  Cholecalciferol (VITAMIN D3) 1000 UNITS CAPS Take 1,000 Units by mouth daily.    Yes [provider]  cloNIDine (CATAPRES) 0.1 MG tablet Take 0.1 mg by mouth 2 (two) times daily as needed (elevated blood pressure >190/80).  01/03/16  Yes [provider]  cyanocobalamin ([facility] VITAMIN B12) 2000 MCG tablet Take 2,000 mcg by mouth at bedtime.    Yes [provider]  famciclovir (FAMVIR) 250 MG tablet Take 1 tablet (250 mg total) by mouth daily. 01/27/18  Yes Ennever, Rudell Cobb, MD  hydrochlorothiazide  (HYDRODIURIL) 25 MG tablet Take 25 mg by mouth daily.   Yes [provider]  losartan (COZAAR) 100 MG tablet Take 100 mg by mouth daily.   Yes [provider]  Multiple Vitamin (MULITIVITAMIN WITH MINERALS) TABS Take 1 tablet by mouth daily.   Yes [provider]  Polyethyl Glycol-Propyl Glycol (SYSTANE OP) Place 1 drop into both eyes daily as needed (dry eyes).    Yes [provider]  potassium chloride SA (K-DUR,KLOR-CON) 20 MEQ tablet Take 20 mEq by mouth daily.   Yes [provider]  pravastatin (PRAVACHOL) 20 MG tablet Take 20 mg by mouth at bedtime.   Yes [provider]  aspirin EC 325 MG tablet Take 1 tablet (325 mg total) by mouth daily. 01/06/18   Volanda Napoleon, MD  dexamethasone (DECADRON) 4 MG tablet Take 5 tablets (20 mg total) by mouth once a week. 01/06/18   Volanda Napoleon, MD  prochlorperazine (COMPAZINE) 10 MG tablet Take 1 tablet (10 mg total) by mouth every 6 (six) hours as needed for nausea or vomiting. 02/03/18   Volanda Napoleon, MD  tadalafil (ADCIRCA/CIALIS) 20 MG tablet Take 20 mg by mouth daily as needed for erectile dysfunction.    [provider]  Zoledronic Acid (ZOMETA) 4 MG/100ML IVPB Inject 4 mg into the vein every 4 (four) months. Administered by Dr. Jonette Eva at [facility] - last infusion mid July 2019    [provider]     Vital Signs: BP (!) 191/100 (  BP Location: Right Arm)   Pulse 64   Temp 98.2 F (36.8 C) (Oral)   Resp 18   SpO2 100%   Physical Exam awake, alert.  Chest clear to auscultation bilaterally.  Heart with regular rate and rhythm.  Abdomen soft, positive bowel sounds, nontender.  No lower extremity edema.  Imaging: No results found.  Labs:  CBC: Recent Labs    04/28/18 1302 05/05/18 1341 05/12/18 1336 05/30/18 1307  WBC 4.7 4.6 5.0 4.3  HGB 9.9* 10.2* 9.6* 10.5*  HCT 30.5* 31.3* 30.3* 32.5*  PLT 145* 144* 131* 181    COAGS: Recent Labs     12/24/17 0851 05/30/18 1307  INR 1.03 0.93  APTT 25  --     BMP: Recent Labs    12/25/17 0824  04/14/18 1306 04/28/18 1302 05/05/18 1341 05/12/18 1336  NA 139   < > 138 130* 133* 133*  K 3.4*   < > 4.2 3.8 4.1 4.4  CL 105   < > 103 97* 97* 99  CO2 26   < > '31 30 30 29  ' GLUCOSE 101*   < > 89 102* 90 91  BUN 12   < > '21 17 19 23  ' CALCIUM 9.1   < > 9.8 9.6 9.6 9.3  CREATININE 1.06   < > 1.40* 1.11 1.10 1.17  GFRNONAA >60  --   --  >60 >60 58*  GFRAA >60  --   --  >60 >60 >60   < > = values in this interval not displayed.    LIVER FUNCTION TESTS: Recent Labs    04/14/18 1306 04/28/18 1302 05/05/18 1341 05/12/18 1336  BILITOT 1.0 0.8 0.8 0.7  AST '30 21 21 19  ' ALT '23 15 16 14  ' ALKPHOS 53 47 47 46  PROT 6.8 6.6 6.5 6.0*  ALBUMIN 3.5 3.8 3.8 3.6    Assessment and Plan: Pt with history of relapsing multiple myeloma ; presents today for Port-A-Cath placement for chemotherapy.Risks and benefits of image guided port-a-catheter placement was discussed with the patient/spouse including, but not limited to bleeding, infection, pneumothorax, or fibrin sheath development and need for additional procedures.  All of the patient's questions were answered, patient is agreeable to proceed. Consent signed and in chart.     Electronically Signed: D. Rowe Robert, PA-C 05/30/2018, 1:35 PM   I spent a total of 20 minutes at the the patient's bedside AND on the patient's hospital floor or unit, greater than 50% of which was counseling/coordinating care for Port-A-Cath placement

## 2018-06-02 ENCOUNTER — Encounter: Payer: Self-pay | Admitting: Hematology & Oncology

## 2018-06-02 ENCOUNTER — Inpatient Hospital Stay: Payer: Medicare Other

## 2018-06-02 ENCOUNTER — Other Ambulatory Visit: Payer: TRICARE For Life (TFL)

## 2018-06-02 ENCOUNTER — Inpatient Hospital Stay (HOSPITAL_BASED_OUTPATIENT_CLINIC_OR_DEPARTMENT_OTHER): Payer: Medicare Other | Admitting: Hematology & Oncology

## 2018-06-02 ENCOUNTER — Other Ambulatory Visit: Payer: Self-pay

## 2018-06-02 ENCOUNTER — Inpatient Hospital Stay: Payer: Medicare Other | Attending: Hematology & Oncology

## 2018-06-02 ENCOUNTER — Other Ambulatory Visit: Payer: Self-pay | Admitting: *Deleted

## 2018-06-02 VITALS — BP 177/84 | HR 55 | Temp 97.6°F | Resp 18 | Wt 213.0 lb

## 2018-06-02 DIAGNOSIS — Z9484 Stem cells transplant status: Secondary | ICD-10-CM | POA: Diagnosis not present

## 2018-06-02 DIAGNOSIS — C9002 Multiple myeloma in relapse: Secondary | ICD-10-CM | POA: Diagnosis not present

## 2018-06-02 DIAGNOSIS — Z79899 Other long term (current) drug therapy: Secondary | ICD-10-CM | POA: Diagnosis not present

## 2018-06-02 DIAGNOSIS — Z5111 Encounter for antineoplastic chemotherapy: Secondary | ICD-10-CM | POA: Insufficient documentation

## 2018-06-02 DIAGNOSIS — Z7982 Long term (current) use of aspirin: Secondary | ICD-10-CM | POA: Insufficient documentation

## 2018-06-02 DIAGNOSIS — Z5112 Encounter for antineoplastic immunotherapy: Secondary | ICD-10-CM | POA: Insufficient documentation

## 2018-06-02 LAB — CMP (CANCER CENTER ONLY)
ALT: 10 U/L (ref 0–44)
AST: 18 U/L (ref 15–41)
Albumin: 3.8 g/dL (ref 3.5–5.0)
Alkaline Phosphatase: 50 U/L (ref 38–126)
Anion gap: 6 (ref 5–15)
BUN: 17 mg/dL (ref 8–23)
CO2: 28 mmol/L (ref 22–32)
Calcium: 9.4 mg/dL (ref 8.9–10.3)
Chloride: 102 mmol/L (ref 98–111)
Creatinine: 0.99 mg/dL (ref 0.61–1.24)
GFR, Est AFR Am: >60 mL/min (ref >60)
GFR, Estimated: >60 mL/min (ref >60)
Glucose, Bld: 99 mg/dL (ref 70–99)
Potassium: 3.7 mmol/L (ref 3.5–5.1)
Sodium: 136 mmol/L (ref 135–145)
Total Bilirubin: 0.8 mg/dL (ref 0.3–1.2)
Total Protein: 7.1 g/dL (ref 6.5–8.1)

## 2018-06-02 LAB — CBC WITH DIFFERENTIAL (CANCER CENTER ONLY)
Abs Immature Granulocytes: 0.01 10*3/uL (ref 0.00–0.07)
Basophils Absolute: 0 10*3/uL (ref 0.0–0.1)
Basophils Relative: 0 %
Eosinophils Absolute: 0 10*3/uL (ref 0.0–0.5)
Eosinophils Relative: 1 %
HCT: 28.6 % — ABNORMAL LOW (ref 39.0–52.0)
Hemoglobin: 9.5 g/dL — ABNORMAL LOW (ref 13.0–17.0)
Immature Granulocytes: 0 %
Lymphocytes Relative: 39 %
Lymphs Abs: 1.4 10*3/uL (ref 0.7–4.0)
MCH: 33.7 pg (ref 26.0–34.0)
MCHC: 33.2 g/dL (ref 30.0–36.0)
MCV: 101.4 fL — ABNORMAL HIGH (ref 80.0–100.0)
Monocytes Absolute: 0.3 10*3/uL (ref 0.1–1.0)
Monocytes Relative: 10 %
Neutro Abs: 1.7 10*3/uL (ref 1.7–7.7)
Neutrophils Relative %: 50 %
Platelet Count: 162 10*3/uL (ref 150–400)
RBC: 2.82 MIL/uL — ABNORMAL LOW (ref 4.22–5.81)
RDW: 17 % — ABNORMAL HIGH (ref 11.5–15.5)
WBC Count: 3.5 10*3/uL — ABNORMAL LOW (ref 4.0–10.5)
nRBC: 0 % (ref 0.0–0.2)

## 2018-06-02 LAB — RETICULOCYTES
Immature Retic Fract: 9.8 % (ref 2.3–15.9)
RBC.: 2.82 MIL/uL — ABNORMAL LOW (ref 4.22–5.81)
Retic Count, Absolute: 41.2 10*3/uL (ref 19.0–186.0)
Retic Ct Pct: 1.5 % (ref 0.4–3.1)

## 2018-06-02 MED ORDER — ONDANSETRON HCL 8 MG PO TABS: 8.0000 mg | ORAL_TABLET | Freq: Three times a day (TID) | ORAL | 2 refills | Status: DC | PRN

## 2018-06-02 MED ORDER — DEXTROSE 5 % IV SOLN
20.0000 mg/m2 | Freq: Once | INTRAVENOUS | Status: AC
  Administered 2018-06-02: 44 mg via INTRAVENOUS
  Filled 2018-06-02: qty 15

## 2018-06-02 MED ORDER — SODIUM CHLORIDE 0.9 % IV SOLN
Freq: Once | INTRAVENOUS | Status: AC
  Administered 2018-06-02: 12:00:00 via INTRAVENOUS
  Filled 2018-06-02: qty 250

## 2018-06-02 MED ORDER — SODIUM CHLORIDE 0.9 % IV SOLN
300.0000 mg/m2 | Freq: Once | INTRAVENOUS | Status: AC
  Administered 2018-06-02: 660 mg via INTRAVENOUS
  Filled 2018-06-02: qty 33

## 2018-06-02 MED ORDER — SODIUM CHLORIDE 0.9 % IV SOLN
20.0000 mg | Freq: Once | INTRAVENOUS | Status: AC
  Administered 2018-06-02: 20 mg via INTRAVENOUS
  Filled 2018-06-02: qty 2

## 2018-06-02 MED ORDER — SODIUM CHLORIDE 0.9% FLUSH
10.0000 mL | INTRAVENOUS | Status: DC | PRN
  Administered 2018-06-02: 10 mL
  Filled 2018-06-02: qty 10

## 2018-06-02 MED ORDER — SODIUM CHLORIDE 0.9 % IV SOLN
Freq: Once | INTRAVENOUS | Status: AC
  Administered 2018-06-02: 13:00:00 via INTRAVENOUS
  Filled 2018-06-02: qty 250

## 2018-06-02 MED ORDER — PROCHLORPERAZINE MALEATE 10 MG PO TABS
ORAL_TABLET | ORAL | Status: AC
  Filled 2018-06-02: qty 1

## 2018-06-02 MED ORDER — HEPARIN SOD (PORK) LOCK FLUSH 100 UNIT/ML IV SOLN
500.0000 [IU] | Freq: Once | INTRAVENOUS | Status: AC | PRN
  Administered 2018-06-02: 500 [IU]
  Filled 2018-06-02: qty 5

## 2018-06-02 MED ORDER — PROCHLORPERAZINE MALEATE 10 MG PO TABS
10.0000 mg | ORAL_TABLET | Freq: Once | ORAL | Status: AC
  Administered 2018-06-02: 10 mg via ORAL

## 2018-06-02 NOTE — Patient Instructions (Signed)
Implanted Port Insertion, Care After  This sheet gives you information about how to care for yourself after your procedure. Your health care provider may also give you more specific instructions. If you have problems or questions, contact your health care provider.  What can I expect after the procedure?  After the procedure, it is common to have:  · Discomfort at the port insertion site.  · Bruising on the skin over the port. This should improve over 3-4 days.  Follow these instructions at home:  Port care  · After your port is placed, you will get a manufacturer's information card. The card has information about your port. Keep this card with you at all times.  · Take care of the port as told by your health care provider. Ask your health care provider if you or a family member can get training for taking care of the port at home. A home health care nurse may also take care of the port.  · Make sure to remember what type of port you have.  Incision care         · Follow instructions from your health care provider about how to take care of your port insertion site. Make sure you:  ? Wash your hands with soap and water before and after you change your bandage (dressing). If soap and water are not available, use hand sanitizer.  ? Change your dressing as told by your health care provider.  ? Leave stitches (sutures), skin glue, or adhesive strips in place. These skin closures may need to stay in place for 2 weeks or longer. If adhesive strip edges start to loosen and curl up, you may trim the loose edges. Do not remove adhesive strips completely unless your health care provider tells you to do that.  · Check your port insertion site every day for signs of infection. Check for:  ? Redness, swelling, or pain.  ? Fluid or blood.  ? Warmth.  ? Pus or a bad smell.  Activity  · Return to your normal activities as told by your health care provider. Ask your health care provider what activities are safe for you.  · Do not  lift anything that is heavier than 10 lb (4.5 kg), or the limit that you are told, until your health care provider says that it is safe.  General instructions  · Take over-the-counter and prescription medicines only as told by your health care provider.  · Do not take baths, swim, or use a hot tub until your health care provider approves. Ask your health care provider if you may take showers. You may only be allowed to take sponge baths.  · Do not drive for 24 hours if you were given a sedative during your procedure.  · Wear a medical alert bracelet in case of an emergency. This will tell any health care providers that you have a port.  · Keep all follow-up visits as told by your health care provider. This is important.  Contact a health care provider if:  · You cannot flush your port with saline as directed, or you cannot draw blood from the port.  · You have a fever or chills.  · You have redness, swelling, or pain around your port insertion site.  · You have fluid or blood coming from your port insertion site.  · Your port insertion site feels warm to the touch.  · You have pus or a bad smell coming from the port   insertion site.  Get help right away if:  · You have chest pain or shortness of breath.  · You have bleeding from your port that you cannot control.  Summary  · Take care of the port as told by your health care provider. Keep the manufacturer's information card with you at all times.  · Change your dressing as told by your health care provider.  · Contact a health care provider if you have a fever or chills or if you have redness, swelling, or pain around your port insertion site.  · Keep all follow-up visits as told by your health care provider.  This information is not intended to replace advice given to you by your health care provider. Make sure you discuss any questions you have with your health care provider.  Document Released: 03/01/2013 Document Revised: 12/07/2017 Document Reviewed:  12/07/2017  Elsevier Interactive Patient Education © 2019 [employer]

## 2018-06-02 NOTE — Patient Instructions (Signed)
Cyclophosphamide injection What is this medicine? CYCLOPHOSPHAMIDE (sye kloe FOSS fa mide) is a chemotherapy drug. It slows the growth of cancer cells. This medicine is used to treat many types of cancer like lymphoma, myeloma, leukemia, breast cancer, and ovarian cancer, to name a few. This medicine may be used for other purposes; ask your health care provider or pharmacist if you have questions. COMMON BRAND NAME(S): Cytoxan, Neosar What should I tell my health care provider before I take this medicine? They need to know if you have any of these conditions: -blood disorders -history of other chemotherapy -infection -kidney disease -liver disease -recent or ongoing radiation therapy -tumors in the bone marrow -an unusual or allergic reaction to cyclophosphamide, other chemotherapy, other medicines, foods, dyes, or preservatives -pregnant or trying to get pregnant -breast-feeding How should I use this medicine? This drug is usually given as an injection into a vein or muscle or by infusion into a vein. It is administered in a hospital or clinic by a specially trained health care professional. Talk to your pediatrician regarding the use of this medicine in children. Special care may be needed. Overdosage: If you think you have taken too much of this medicine contact a poison control center or emergency room at once. NOTE: This medicine is only for you. Do not share this medicine with others. What if I miss a dose? It is important not to miss your dose. Call your doctor or health care professional if you are unable to keep an appointment. What may interact with this medicine? This medicine may interact with the following medications: -amiodarone -amphotericin B -azathioprine -certain antiviral medicines for HIV or AIDS such as protease inhibitors (e.g., indinavir, ritonavir) and zidovudine -certain blood pressure medications such as benazepril, captopril, enalapril, fosinopril,  lisinopril, moexipril, monopril, perindopril, quinapril, ramipril, trandolapril -certain cancer medications such as anthracyclines (e.g., daunorubicin, doxorubicin), busulfan, cytarabine, paclitaxel, pentostatin, tamoxifen, trastuzumab -certain diuretics such as chlorothiazide, chlorthalidone, hydrochlorothiazide, indapamide, metolazone -certain medicines that treat or prevent blood clots like warfarin -certain muscle relaxants such as succinylcholine -cyclosporine -etanercept -indomethacin -medicines to increase blood counts like filgrastim, pegfilgrastim, sargramostim -medicines used as general anesthesia -metronidazole -natalizumab This list may not describe all possible interactions. Give your health care provider a list of all the medicines, herbs, non-prescription drugs, or dietary supplements you use. Also tell them if you smoke, drink alcohol, or use illegal drugs. Some items may interact with your medicine. What should I watch for while using this medicine? Visit your doctor for checks on your progress. This drug may make you feel generally unwell. This is not uncommon, as chemotherapy can affect healthy cells as well as cancer cells. Report any side effects. Continue your course of treatment even though you feel ill unless your doctor tells you to stop. Drink water or other fluids as directed. Urinate often, even at night. In some cases, you may be given additional medicines to help with side effects. Follow all directions for their use. Call your doctor or health care professional for advice if you get a fever, chills or sore throat, or other symptoms of a cold or flu. Do not treat yourself. This drug decreases your body's ability to fight infections. Try to avoid being around people who are sick. This medicine may increase your risk to bruise or bleed. Call your doctor or health care professional if you notice any unusual bleeding. Be careful brushing and flossing your teeth or using a  toothpick because you may get an infection or bleed   more easily. If you have any dental work done, tell your dentist you are receiving this medicine. You may get drowsy or dizzy. Do not drive, use machinery, or do anything that needs mental alertness until you know how this medicine affects you. Do not become pregnant while taking this medicine or for 1 year after stopping it. Women should inform their doctor if they wish to become pregnant or think they might be pregnant. Men should not father a child while taking this medicine and for 4 months after stopping it. There is a potential for serious side effects to an unborn child. Talk to your health care professional or pharmacist for more information. Do not breast-feed an infant while taking this medicine. This medicine may interfere with the ability to have a child. This medicine has caused ovarian failure in some women. This medicine has caused reduced sperm counts in some men. You should talk with your doctor or health care professional if you are concerned about your fertility. If you are going to have surgery, tell your doctor or health care professional that you have taken this medicine. What side effects may I notice from receiving this medicine? Side effects that you should report to your doctor or health care professional as soon as possible: -allergic reactions like skin rash, itching or hives, swelling of the face, lips, or tongue -low blood counts - this medicine may decrease the number of white blood cells, red blood cells and platelets. You may be at increased risk for infections and bleeding. -signs of infection - fever or chills, cough, sore throat, pain or difficulty passing urine -signs of decreased platelets or bleeding - bruising, pinpoint red spots on the skin, black, tarry stools, blood in the urine -signs of decreased red blood cells - unusually weak or tired, fainting spells, lightheadedness -breathing problems -dark  urine -dizziness -palpitations -swelling of the ankles, feet, hands -trouble passing urine or change in the amount of urine -weight gain -yellowing of the eyes or skin Side effects that usually do not require medical attention (report to your doctor or health care professional if they continue or are bothersome): -changes in nail or skin color -hair loss -missed menstrual periods -mouth sores -nausea, vomiting This list may not describe all possible side effects. Call your doctor for medical advice about side effects. You may report side effects to FDA at 1-800-FDA-1088. Where should I keep my medicine? This drug is given in a hospital or clinic and will not be stored at home. NOTE: This sheet is a summary. It may not cover all possible information. If you have questions about this medicine, talk to your doctor, pharmacist, or health care provider.  2019 Elsevier/Gold Standard (2012-03-25 16:22:58) Carfilzomib injection What is this medicine? CARFILZOMIB (kar FILZ oh mib) targets a specific protein within cancer cells and stops the cancer cells from growing. It is used to treat multiple myeloma. This medicine may be used for other purposes; ask your health care provider or pharmacist if you have questions. COMMON BRAND NAME(S): KYPROLIS What should I tell my health care provider before I take this medicine? They need to know if you have any of these conditions: -heart disease -history of blood clots -irregular heartbeat -kidney disease -liver disease -lung or breathing disease -an unusual or allergic reaction to carfilzomib, or other medicines, foods, dyes, or preservatives -pregnant or trying to get pregnant -breast-feeding How should I use this medicine? This medicine is for injection or infusion into a vein. It is given  by a health care professional in a hospital or clinic setting. Talk to your pediatrician regarding the use of this medicine in children. Special care may be  needed. Overdosage: If you think you have taken too much of this medicine contact a poison control center or emergency room at once. NOTE: This medicine is only for you. Do not share this medicine with others. What if I miss a dose? It is important not to miss your dose. Call your doctor or health care professional if you are unable to keep an appointment. What may interact with this medicine? Interactions are not expected. Give your health care provider a list of all the medicines, herbs, non-prescription drugs, or dietary supplements you use. Also tell them if you smoke, drink alcohol, or use illegal drugs. Some items may interact with your medicine. This list may not describe all possible interactions. Give your health care provider a list of all the medicines, herbs, non-prescription drugs, or dietary supplements you use. Also tell them if you smoke, drink alcohol, or use illegal drugs. Some items may interact with your medicine. What should I watch for while using this medicine? Your condition will be monitored carefully while you are receiving this medicine. Report any side effects. Continue your course of treatment even though you feel ill unless your doctor tells you to stop. You may need blood work done while you are taking this medicine. Do not become pregnant while taking this medicine or for at least 6 months after stopping it. Women should inform their doctor if they wish to become pregnant or think they might be pregnant. There is a potential for serious side effects to an unborn child. Men should not father a child while taking this medicine and for at least 3 months after stopping it. Talk to your health care professional or pharmacist for more information. Do not breast-feed an infant while taking this medicine or for 2 weeks after the last dose. Check with your doctor or health care professional if you get an attack of severe diarrhea, nausea and vomiting, or if you sweat a lot. The  loss of too much body fluid can make it dangerous for you to take this medicine. You may get dizzy. Do not drive, use machinery, or do anything that needs mental alertness until you know how this medicine affects you. Do not stand or sit up quickly, especially if you are an older patient. This reduces the risk of dizzy or fainting spells. What side effects may I notice from receiving this medicine? Side effects that you should report to your doctor or health care professional as soon as possible: -allergic reactions like skin rash, itching or hives, swelling of the face, lips, or tongue -confusion -dizziness -feeling faint or lightheaded -fever or chills -palpitations -seizures -signs and symptoms of bleeding such as bloody or black, tarry stools; red or dark-brown urine; spitting up blood or brown material that looks like coffee grounds; red spots on the skin; unusual bruising or bleeding including from the eye, gums, or nose -signs and symptoms of a blood clot such as breathing problems; changes in vision; chest pain; severe, sudden headache; pain, swelling, warmth in the leg; trouble speaking; sudden numbness or weakness of the face, arm or leg -signs and symptoms of kidney injury like trouble passing urine or change in the amount of urine -signs and symptoms of liver injury like dark yellow or brown urine; general ill feeling or flu-like symptoms; light-colored stools; loss of appetite; nausea; right   upper belly pain; unusually weak or tired; yellowing of the eyes or skin Side effects that usually do not require medical attention (report to your doctor or health care professional if they continue or are bothersome): -back pain -cough -diarrhea -headache -muscle cramps -vomiting This list may not describe all possible side effects. Call your doctor for medical advice about side effects. You may report side effects to FDA at 1-800-FDA-1088. Where should I keep my medicine? This drug is  given in a hospital or clinic and will not be stored at home. NOTE: This sheet is a summary. It may not cover all possible information. If you have questions about this medicine, talk to your doctor, pharmacist, or health care provider.  2019 Elsevier/Gold Standard (2017-02-24 14:07:13)

## 2018-06-02 NOTE — Progress Notes (Signed)
Hematology and Oncology Follow Up Visit  Robert [name] [id number] Sep 19, 1933 85 y.o. 06/02/2018   Principle Diagnosis:  IgG Kappa Myeloma-Relapsed -- Trisomy 51, 13q-  Current Therapy:   RVD - S/p cycle #3 -- revlimid on hold -- d/c on 05/12/2018 KyCyD -- start cycle #1 on 06/02/2018 Zometa 4 mg IV q 29months - next dose on 07/2018   Interim History: Robert [name] is here today for follow-up.  He had a very nice Christmas and New Year's.  As always, he ate quite a bit.  Thankfully, he did not gain any weight.  He now has his Port-A-Cath in.  We are going to start him on treatment with Kyprolis/Cytoxan today.  I really feel that this will get his myeloma under better control.  His myeloma studies back in December showed an M spike of 1.3 g/dL.  His IgG level was 1714 mg/dL.  His kappa light chain was 11.2 mg/dL.  He has had no problems with pain.  He has had no bleeding.  There is been no change in bowel or bladder habits.  He has had no leg swelling.  He has had no rashes.  He has had no headache.  There is been no visual difficulties.  Overall, his performance status is ECOG 1.    Medications:  Allergies as of 06/02/2018   No Known Allergies     Medication List       Accurate as of June 02, 2018 11:35 AM. Always use your most recent med list.        aspirin EC 325 MG tablet Take 1 tablet (325 mg total) by mouth daily.   bimatoprost 0.01 % Soln Commonly known as:  LUMIGAN Place 1 drop into both eyes at bedtime.   CALTRATE 600+D PLUS PO Take 1 tablet by mouth daily.   carvedilol 25 MG tablet Commonly known as:  COREG Take 25 mg by mouth 2 (two) times daily with a meal.   cloNIDine 0.1 MG tablet Commonly known as:  CATAPRES Take 0.1 mg by mouth 2 (two) times daily as needed (elevated blood pressure >190/80).   [employer] VITAMIN B12 2000 MCG tablet Generic drug:  cyanocobalamin Take 2,000 mcg by mouth at bedtime.   dexamethasone 4 MG tablet Commonly known as:   DECADRON Take 5 tablets (20 mg total) by mouth once a week.   famciclovir 250 MG tablet Commonly known as:  FAMVIR Take 1 tablet (250 mg total) by mouth daily.   hydrochlorothiazide 25 MG tablet Commonly known as:  HYDRODIURIL Take 25 mg by mouth daily.   losartan 100 MG tablet Commonly known as:  COZAAR Take 100 mg by mouth daily.   multivitamin with minerals Tabs tablet Take 1 tablet by mouth daily.   potassium chloride SA 20 MEQ tablet Commonly known as:  K-DUR,KLOR-CON Take 20 mEq by mouth daily.   pravastatin 20 MG tablet Commonly known as:  PRAVACHOL Take 20 mg by mouth at bedtime.   prochlorperazine 10 MG tablet Commonly known as:  COMPAZINE Take 1 tablet (10 mg total) by mouth every 6 (six) hours as needed for nausea or vomiting.   SYSTANE OP Place 1 drop into both eyes daily as needed (dry eyes).   tadalafil 20 MG tablet Commonly known as:  ADCIRCA/CIALIS Take 20 mg by mouth daily as needed for erectile dysfunction.   Vitamin D3 25 MCG (1000 UT) Caps Take 1,000 Units by mouth daily.   ZOMETA 4 MG/100ML IVPB Generic drug:  Zoledronic Acid  Inject 4 mg into the vein every 4 (four) months. Administered by Dr. Jonette Eva at [facility] - last infusion mid July 2019       Allergies: No Known Allergies  Past Medical History, Surgical history, Social history, and Family History were reviewed and updated.  Review of Systems: Review of Systems  Constitutional: Positive for malaise/fatigue.  HENT: Negative.   Eyes: Negative.   Respiratory: Positive for shortness of breath.   Cardiovascular: Positive for leg swelling.  Gastrointestinal: Negative.   Genitourinary: Negative.   Musculoskeletal: Negative.   Skin: Negative.   Neurological: Negative.   Endo/Heme/Allergies: Negative.   Psychiatric/Behavioral: Negative.      Physical Exam:  weight is 213 lb (96.6 kg). His oral temperature is 97.6 F (36.4 C). His blood pressure is 177/84 (abnormal)  and his pulse is 55 (abnormal). His respiration is 18 and oxygen saturation is 100%.   Wt Readings from Last 3 Encounters:  06/02/18 213 lb (96.6 kg)  04/28/18 214 lb 6.4 oz (97.3 kg)  03/31/18 213 lb (96.6 kg)    Physical Exam Vitals signs reviewed.  HENT:     Head: Normocephalic and atraumatic.  Eyes:     Pupils: Pupils are equal, round, and reactive to light.  Neck:     Musculoskeletal: Normal range of motion.  Cardiovascular:     Rate and Rhythm: Normal rate and regular rhythm.     Heart sounds: Normal heart sounds.  Pulmonary:     Effort: Pulmonary effort is normal.     Breath sounds: Normal breath sounds.  Abdominal:     General: Bowel sounds are normal.     Palpations: Abdomen is soft.  Musculoskeletal: Normal range of motion.        General: No tenderness or deformity.  Lymphadenopathy:     Cervical: No cervical adenopathy.  Skin:    General: Skin is warm and dry.     Findings: No erythema or rash.  Neurological:     Mental Status: He is alert and oriented to person, place, and time.  Psychiatric:        Behavior: Behavior normal.        Thought Content: Thought content normal.        Judgment: Judgment normal.      Lab Results  Component Value Date   WBC 3.5 (L) 06/02/2018   HGB 9.5 (L) 06/02/2018   HCT 28.6 (L) 06/02/2018   MCV 101.4 (H) 06/02/2018   PLT 162 06/02/2018   Lab Results  Component Value Date   FERRITIN 466 (H) 04/28/2018   IRON 84 04/28/2018   TIBC 276 04/28/2018   UIBC 192 04/28/2018   IRONPCTSAT 31 04/28/2018   Lab Results  Component Value Date   RETICCTPCT 1.5 06/02/2018   RBC 2.82 (L) 06/02/2018   RBC 2.82 (L) 06/02/2018   Lab Results  Component Value Date   KPAFRELGTCHN 112.1 (H) 04/28/2018   LAMBDASER 10.4 04/28/2018   KAPLAMBRATIO 10.78 (H) 04/28/2018   Lab Results  Component Value Date   IGGSERUM 1,714 (H) 04/28/2018   IGA 40 (L) 04/28/2018   IGMSERUM 53 04/28/2018   Lab Results  Component Value Date    TOTALPROTELP 6.6 04/28/2018   ALBUMINELP 3.6 04/28/2018   A1GS 0.2 04/28/2018   A2GS 0.5 04/28/2018   BETS 0.8 04/28/2018   BETA2SER 0.3 01/03/2015   GAMS 1.5 04/28/2018   MSPIKE 1.3 (H) 04/28/2018   SPEI * 01/03/2015     Chemistry  Component Value Date/Time   NA 133 (L) 05/12/2018 1336   NA 137 04/08/2017 1141   NA 138 10/29/2015 1029   K 4.4 05/12/2018 1336   K 3.4 04/08/2017 1141   K 3.9 10/29/2015 1029   CL 99 05/12/2018 1336   CL 102 04/08/2017 1141   CO2 29 05/12/2018 1336   CO2 30 04/08/2017 1141   CO2 27 10/29/2015 1029   BUN 23 05/12/2018 1336   BUN 16 04/08/2017 1141   BUN 13.8 10/29/2015 1029   CREATININE 1.17 05/12/2018 1336   CREATININE 1.3 (H) 04/08/2017 1141   CREATININE 1.1 10/29/2015 1029      Component Value Date/Time   CALCIUM 9.3 05/12/2018 1336   CALCIUM 9.3 04/08/2017 1141   CALCIUM 9.3 10/29/2015 1029   ALKPHOS 46 05/12/2018 1336   ALKPHOS 66 04/08/2017 1141   ALKPHOS 52 10/29/2015 1029   AST 19 05/12/2018 1336   AST 20 10/29/2015 1029   ALT 14 05/12/2018 1336   ALT 21 04/08/2017 1141   ALT 14 10/29/2015 1029   BILITOT 0.7 05/12/2018 1336   BILITOT 0.75 10/29/2015 1029      Impression and Plan: Mr. Amon is a very pleasant 85 yo gentleman with IgG kappa myeloma with history of stem cell transplant in 2006.   We will go ahead with his treatment today.  He will have treatment weekly for 3 weeks on and one-week off.  I will plan to see him back in 1 month.  Hopefully, we will see that his myeloma levels are improving nicely.  I still get him on him about his weight.  I get a hold on him all the time about not exercising.  I told him that he really needs to exercise in order to make treatment more effective.  His wife is with this and she totally agrees with me.  Volanda Napoleon, MD 1/9/202011:35 AM

## 2018-06-03 ENCOUNTER — Encounter: Payer: Self-pay | Admitting: Hematology & Oncology

## 2018-06-03 LAB — KAPPA/LAMBDA LIGHT CHAINS
Kappa free light chain: 126.9 mg/L — ABNORMAL HIGH (ref 3.3–19.4)
Kappa, lambda light chain ratio: 14.26 — ABNORMAL HIGH (ref 0.26–1.65)
Lambda free light chains: 8.9 mg/L (ref 5.7–26.3)

## 2018-06-03 LAB — IGG, IGA, IGM
IgA: 39 mg/dL — ABNORMAL LOW (ref 61–437)
IgG (Immunoglobin G), Serum: 1879 mg/dL — ABNORMAL HIGH (ref 700–1600)
IgM (Immunoglobulin M), Srm: 57 mg/dL (ref 15–143)

## 2018-06-03 LAB — ERYTHROPOIETIN: Erythropoietin: 28.1 m[IU]/mL — ABNORMAL HIGH (ref 2.6–18.5)

## 2018-06-03 LAB — IRON AND TIBC
Iron: 90 ug/dL (ref 42–163)
Saturation Ratios: 33 % (ref 20–55)
TIBC: 274 ug/dL (ref 202–409)
UIBC: 184 ug/dL (ref 117–376)

## 2018-06-03 LAB — FERRITIN: Ferritin: 466 ng/mL — ABNORMAL HIGH (ref 24–336)

## 2018-06-06 DIAGNOSIS — Z6829 Body mass index (BMI) 29.0-29.9, adult: Secondary | ICD-10-CM | POA: Diagnosis not present

## 2018-06-06 DIAGNOSIS — G4709 Other insomnia: Secondary | ICD-10-CM | POA: Diagnosis not present

## 2018-06-06 DIAGNOSIS — E7849 Other hyperlipidemia: Secondary | ICD-10-CM | POA: Diagnosis not present

## 2018-06-06 DIAGNOSIS — I10 Essential (primary) hypertension: Secondary | ICD-10-CM | POA: Diagnosis not present

## 2018-06-07 LAB — PROTEIN ELECTROPHORESIS, SERUM, WITH REFLEX
A/G Ratio: 1.2 (ref 0.7–1.7)
Albumin ELP: 3.7 g/dL (ref 2.9–4.4)
Alpha-1-Globulin: 0.2 g/dL (ref 0.0–0.4)
Alpha-2-Globulin: 0.7 g/dL (ref 0.4–1.0)
Beta Globulin: 0.7 g/dL (ref 0.7–1.3)
Gamma Globulin: 1.6 g/dL (ref 0.4–1.8)
Globulin, Total: 3.1 g/dL (ref 2.2–3.9)
M-Spike, %: 1.4 g/dL — ABNORMAL HIGH
SPEP Interpretation: 0
Total Protein ELP: 6.8 g/dL (ref 6.0–8.5)

## 2018-06-07 LAB — IMMUNOFIXATION REFLEX, SERUM
IgA: 41 mg/dL — ABNORMAL LOW (ref 61–437)
IgG (Immunoglobin G), Serum: 1811 mg/dL — ABNORMAL HIGH (ref 700–1600)
IgM (Immunoglobulin M), Srm: 59 mg/dL (ref 15–143)

## 2018-06-08 ENCOUNTER — Other Ambulatory Visit: Payer: Self-pay | Admitting: *Deleted

## 2018-06-08 DIAGNOSIS — C9002 Multiple myeloma in relapse: Secondary | ICD-10-CM

## 2018-06-09 ENCOUNTER — Inpatient Hospital Stay: Payer: Medicare Other

## 2018-06-09 DIAGNOSIS — C9002 Multiple myeloma in relapse: Secondary | ICD-10-CM

## 2018-06-09 DIAGNOSIS — Z5112 Encounter for antineoplastic immunotherapy: Secondary | ICD-10-CM | POA: Diagnosis not present

## 2018-06-09 DIAGNOSIS — Z79899 Other long term (current) drug therapy: Secondary | ICD-10-CM | POA: Diagnosis not present

## 2018-06-09 DIAGNOSIS — Z9484 Stem cells transplant status: Secondary | ICD-10-CM | POA: Diagnosis not present

## 2018-06-09 DIAGNOSIS — Z7982 Long term (current) use of aspirin: Secondary | ICD-10-CM | POA: Diagnosis not present

## 2018-06-09 DIAGNOSIS — Z5111 Encounter for antineoplastic chemotherapy: Secondary | ICD-10-CM | POA: Diagnosis not present

## 2018-06-09 LAB — CBC WITH DIFFERENTIAL (CANCER CENTER ONLY)
Abs Immature Granulocytes: 0.01 10*3/uL (ref 0.00–0.07)
Basophils Absolute: 0 10*3/uL (ref 0.0–0.1)
Basophils Relative: 0 %
Eosinophils Absolute: 0 10*3/uL (ref 0.0–0.5)
Eosinophils Relative: 1 %
HCT: 27.5 % — ABNORMAL LOW (ref 39.0–52.0)
Hemoglobin: 9.2 g/dL — ABNORMAL LOW (ref 13.0–17.0)
Immature Granulocytes: 0 %
Lymphocytes Relative: 45 %
Lymphs Abs: 1.4 10*3/uL (ref 0.7–4.0)
MCH: 33.8 pg (ref 26.0–34.0)
MCHC: 33.5 g/dL (ref 30.0–36.0)
MCV: 101.1 fL — ABNORMAL HIGH (ref 80.0–100.0)
Monocytes Absolute: 0.2 10*3/uL (ref 0.1–1.0)
Monocytes Relative: 7 %
Neutro Abs: 1.4 10*3/uL — ABNORMAL LOW (ref 1.7–7.7)
Neutrophils Relative %: 47 %
Platelet Count: 120 10*3/uL — ABNORMAL LOW (ref 150–400)
RBC: 2.72 MIL/uL — ABNORMAL LOW (ref 4.22–5.81)
RDW: 16.7 % — ABNORMAL HIGH (ref 11.5–15.5)
WBC Count: 3 10*3/uL — ABNORMAL LOW (ref 4.0–10.5)
nRBC: 0.7 % — ABNORMAL HIGH (ref 0.0–0.2)

## 2018-06-09 LAB — CMP (CANCER CENTER ONLY)
ALT: 10 U/L (ref 0–44)
AST: 18 U/L (ref 15–41)
Albumin: 3.7 g/dL (ref 3.5–5.0)
Alkaline Phosphatase: 51 U/L (ref 38–126)
Anion gap: 4 — ABNORMAL LOW (ref 5–15)
BUN: 17 mg/dL (ref 8–23)
CO2: 29 mmol/L (ref 22–32)
Calcium: 9.2 mg/dL (ref 8.9–10.3)
Chloride: 102 mmol/L (ref 98–111)
Creatinine: 1.13 mg/dL (ref 0.61–1.24)
GFR, Est AFR Am: >60 mL/min (ref >60)
GFR, Estimated: >60 mL/min (ref >60)
Glucose, Bld: 98 mg/dL (ref 70–99)
Potassium: 3.9 mmol/L (ref 3.5–5.1)
Sodium: 135 mmol/L (ref 135–145)
Total Bilirubin: 0.8 mg/dL (ref 0.3–1.2)
Total Protein: 6.7 g/dL (ref 6.5–8.1)

## 2018-06-09 MED ORDER — SODIUM CHLORIDE 0.9 % IV SOLN
Freq: Once | INTRAVENOUS | Status: AC
  Administered 2018-06-09: 12:00:00 via INTRAVENOUS
  Filled 2018-06-09: qty 250

## 2018-06-09 MED ORDER — HEPARIN SOD (PORK) LOCK FLUSH 100 UNIT/ML IV SOLN
500.0000 [IU] | Freq: Once | INTRAVENOUS | Status: DC | PRN
  Filled 2018-06-09: qty 5

## 2018-06-09 MED ORDER — SODIUM CHLORIDE 0.9 % IV SOLN
Freq: Once | INTRAVENOUS | Status: DC
  Filled 2018-06-09: qty 250

## 2018-06-09 MED ORDER — PROCHLORPERAZINE MALEATE 10 MG PO TABS
ORAL_TABLET | ORAL | Status: AC
  Filled 2018-06-09: qty 1

## 2018-06-09 MED ORDER — DEXTROSE 5 % IV SOLN
50.0000 mg/m2 | Freq: Once | INTRAVENOUS | Status: AC
  Administered 2018-06-09: 110 mg via INTRAVENOUS
  Filled 2018-06-09: qty 30

## 2018-06-09 MED ORDER — SODIUM CHLORIDE 0.9 % IV SOLN
300.0000 mg/m2 | Freq: Once | INTRAVENOUS | Status: AC
  Administered 2018-06-09: 660 mg via INTRAVENOUS
  Filled 2018-06-09: qty 33

## 2018-06-09 MED ORDER — SODIUM CHLORIDE 0.9% FLUSH
10.0000 mL | INTRAVENOUS | Status: DC | PRN
  Filled 2018-06-09: qty 10

## 2018-06-09 MED ORDER — SODIUM CHLORIDE 0.9 % IV SOLN
20.0000 mg | Freq: Once | INTRAVENOUS | Status: AC
  Administered 2018-06-09: 20 mg via INTRAVENOUS
  Filled 2018-06-09: qty 2

## 2018-06-09 MED ORDER — PROCHLORPERAZINE MALEATE 10 MG PO TABS
10.0000 mg | ORAL_TABLET | Freq: Once | ORAL | Status: AC
  Administered 2018-06-09: 10 mg via ORAL

## 2018-06-09 NOTE — Patient Instructions (Signed)

## 2018-06-09 NOTE — Progress Notes (Signed)
OK to treat with ANC of 1.4 per Dr Marin Olp. dph

## 2018-06-09 NOTE — Patient Instructions (Signed)
Carfilzomib injection What is this medicine? CARFILZOMIB (kar FILZ oh mib) targets a specific protein within cancer cells and stops the cancer cells from growing. It is used to treat multiple myeloma. This medicine may be used for other purposes; ask your health care provider or pharmacist if you have questions. COMMON BRAND NAME(S): KYPROLIS What should I tell my health care provider before I take this medicine? They need to know if you have any of these conditions: -heart disease -history of blood clots -irregular heartbeat -kidney disease -liver disease -lung or breathing disease -an unusual or allergic reaction to carfilzomib, or other medicines, foods, dyes, or preservatives -pregnant or trying to get pregnant -breast-feeding How should I use this medicine? This medicine is for injection or infusion into a vein. It is given by a health care professional in a hospital or clinic setting. Talk to your pediatrician regarding the use of this medicine in children. Special care may be needed. Overdosage: If you think you have taken too much of this medicine contact a poison control center or emergency room at once. NOTE: This medicine is only for you. Do not share this medicine with others. What if I miss a dose? It is important not to miss your dose. Call your doctor or health care professional if you are unable to keep an appointment. What may interact with this medicine? Interactions are not expected. Give your health care provider a list of all the medicines, herbs, non-prescription drugs, or dietary supplements you use. Also tell them if you smoke, drink alcohol, or use illegal drugs. Some items may interact with your medicine. This list may not describe all possible interactions. Give your health care provider a list of all the medicines, herbs, non-prescription drugs, or dietary supplements you use. Also tell them if you smoke, drink alcohol, or use illegal drugs. Some items may  interact with your medicine. What should I watch for while using this medicine? Your condition will be monitored carefully while you are receiving this medicine. Report any side effects. Continue your course of treatment even though you feel ill unless your doctor tells you to stop. You may need blood work done while you are taking this medicine. Do not become pregnant while taking this medicine or for at least 6 months after stopping it. Women should inform their doctor if they wish to become pregnant or think they might be pregnant. There is a potential for serious side effects to an unborn child. Men should not father a child while taking this medicine and for at least 3 months after stopping it. Talk to your health care professional or pharmacist for more information. Do not breast-feed an infant while taking this medicine or for 2 weeks after the last dose. Check with your doctor or health care professional if you get an attack of severe diarrhea, nausea and vomiting, or if you sweat a lot. The loss of too much body fluid can make it dangerous for you to take this medicine. You may get dizzy. Do not drive, use machinery, or do anything that needs mental alertness until you know how this medicine affects you. Do not stand or sit up quickly, especially if you are an older patient. This reduces the risk of dizzy or fainting spells. What side effects may I notice from receiving this medicine? Side effects that you should report to your doctor or health care professional as soon as possible: -allergic reactions like skin rash, itching or hives, swelling of the face, lips, or  tongue -confusion -dizziness -feeling faint or lightheaded -fever or chills -palpitations -seizures -signs and symptoms of bleeding such as bloody or black, tarry stools; red or dark-brown urine; spitting up blood or brown material that looks like coffee grounds; red spots on the skin; unusual bruising or bleeding including from  the eye, gums, or nose -signs and symptoms of a blood clot such as breathing problems; changes in vision; chest pain; severe, sudden headache; pain, swelling, warmth in the leg; trouble speaking; sudden numbness or weakness of the face, arm or leg -signs and symptoms of kidney injury like trouble passing urine or change in the amount of urine -signs and symptoms of liver injury like dark yellow or brown urine; general ill feeling or flu-like symptoms; light-colored stools; loss of appetite; nausea; right upper belly pain; unusually weak or tired; yellowing of the eyes or skin Side effects that usually do not require medical attention (report to your doctor or health care professional if they continue or are bothersome): -back pain -cough -diarrhea -headache -muscle cramps -vomiting This list may not describe all possible side effects. Call your doctor for medical advice about side effects. You may report side effects to FDA at 1-800-FDA-1088. Where should I keep my medicine? This drug is given in a hospital or clinic and will not be stored at home. NOTE: This sheet is a summary. It may not cover all possible information. If you have questions about this medicine, talk to your doctor, pharmacist, or health care provider.  2019 [employer] Standard (2017-02-24 14:07:13) Cyclophosphamide injection What is this medicine? CYCLOPHOSPHAMIDE (sye kloe FOSS fa mide) is a chemotherapy drug. It slows the growth of cancer cells. This medicine is used to treat many types of cancer like lymphoma, myeloma, leukemia, breast cancer, and ovarian cancer, to name a few. This medicine may be used for other purposes; ask your health care provider or pharmacist if you have questions. COMMON BRAND NAME(S): Cytoxan, Neosar What should I tell my health care provider before I take this medicine? They need to know if you have any of these conditions: -blood disorders -history of other  chemotherapy -infection -kidney disease -liver disease -recent or ongoing radiation therapy -tumors in the bone marrow -an unusual or allergic reaction to cyclophosphamide, other chemotherapy, other medicines, foods, dyes, or preservatives -pregnant or trying to get pregnant -breast-feeding How should I use this medicine? This drug is usually given as an injection into a vein or muscle or by infusion into a vein. It is administered in a hospital or clinic by a specially trained health care professional. Talk to your pediatrician regarding the use of this medicine in children. Special care may be needed. Overdosage: If you think you have taken too much of this medicine contact a poison control center or emergency room at once. NOTE: This medicine is only for you. Do not share this medicine with others. What if I miss a dose? It is important not to miss your dose. Call your doctor or health care professional if you are unable to keep an appointment. What may interact with this medicine? This medicine may interact with the following medications: -amiodarone -amphotericin B -azathioprine -certain antiviral medicines for HIV or AIDS such as protease inhibitors (e.g., indinavir, ritonavir) and zidovudine -certain blood pressure medications such as benazepril, captopril, enalapril, fosinopril, lisinopril, moexipril, monopril, perindopril, quinapril, ramipril, trandolapril -certain cancer medications such as anthracyclines (e.g., daunorubicin, doxorubicin), busulfan, cytarabine, paclitaxel, pentostatin, tamoxifen, trastuzumab -certain diuretics such as chlorothiazide, chlorthalidone, hydrochlorothiazide, indapamide, metolazone -certain medicines   that treat or prevent blood clots like warfarin -certain muscle relaxants such as succinylcholine -cyclosporine -etanercept -indomethacin -medicines to increase blood counts like filgrastim, pegfilgrastim, sargramostim -medicines used as general  anesthesia -metronidazole -natalizumab This list may not describe all possible interactions. Give your health care provider a list of all the medicines, herbs, non-prescription drugs, or dietary supplements you use. Also tell them if you smoke, drink alcohol, or use illegal drugs. Some items may interact with your medicine. What should I watch for while using this medicine? Visit your doctor for checks on your progress. This drug may make you feel generally unwell. This is not uncommon, as chemotherapy can affect healthy cells as well as cancer cells. Report any side effects. Continue your course of treatment even though you feel ill unless your doctor tells you to stop. Drink water or other fluids as directed. Urinate often, even at night. In some cases, you may be given additional medicines to help with side effects. Follow all directions for their use. Call your doctor or health care professional for advice if you get a fever, chills or sore throat, or other symptoms of a cold or flu. Do not treat yourself. This drug decreases your body's ability to fight infections. Try to avoid being around people who are sick. This medicine may increase your risk to bruise or bleed. Call your doctor or health care professional if you notice any unusual bleeding. Be careful brushing and flossing your teeth or using a toothpick because you may get an infection or bleed more easily. If you have any dental work done, tell your dentist you are receiving this medicine. You may get drowsy or dizzy. Do not drive, use machinery, or do anything that needs mental alertness until you know how this medicine affects you. Do not become pregnant while taking this medicine or for 1 year after stopping it. Women should inform their doctor if they wish to become pregnant or think they might be pregnant. Men should not father a child while taking this medicine and for 4 months after stopping it. There is a potential for serious side  effects to an unborn child. Talk to your health care professional or pharmacist for more information. Do not breast-feed an infant while taking this medicine. This medicine may interfere with the ability to have a child. This medicine has caused ovarian failure in some women. This medicine has caused reduced sperm counts in some men. You should talk with your doctor or health care professional if you are concerned about your fertility. If you are going to have surgery, tell your doctor or health care professional that you have taken this medicine. What side effects may I notice from receiving this medicine? Side effects that you should report to your doctor or health care professional as soon as possible: -allergic reactions like skin rash, itching or hives, swelling of the face, lips, or tongue -low blood counts - this medicine may decrease the number of white blood cells, red blood cells and platelets. You may be at increased risk for infections and bleeding. -signs of infection - fever or chills, cough, sore throat, pain or difficulty passing urine -signs of decreased platelets or bleeding - bruising, pinpoint red spots on the skin, black, tarry stools, blood in the urine -signs of decreased red blood cells - unusually weak or tired, fainting spells, lightheadedness -breathing problems -dark urine -dizziness -palpitations -swelling of the ankles, feet, hands -trouble passing urine or change in the amount of urine -weight gain -  yellowing of the eyes or skin Side effects that usually do not require medical attention (report to your doctor or health care professional if they continue or are bothersome): -changes in nail or skin color -hair loss -missed menstrual periods -mouth sores -nausea, vomiting This list may not describe all possible side effects. Call your doctor for medical advice about side effects. You may report side effects to FDA at 1-800-FDA-1088. Where should I keep my  medicine? This drug is given in a hospital or clinic and will not be stored at home. NOTE: This sheet is a summary. It may not cover all possible information. If you have questions about this medicine, talk to your doctor, pharmacist, or health care provider.  2019 [employer]/Gold Standard (2012-03-25 16:22:58)  

## 2018-06-15 ENCOUNTER — Other Ambulatory Visit: Payer: Self-pay | Admitting: Family

## 2018-06-15 DIAGNOSIS — C9002 Multiple myeloma in relapse: Secondary | ICD-10-CM

## 2018-06-16 ENCOUNTER — Inpatient Hospital Stay: Payer: Medicare Other

## 2018-06-16 DIAGNOSIS — C9002 Multiple myeloma in relapse: Secondary | ICD-10-CM

## 2018-06-16 DIAGNOSIS — Z7982 Long term (current) use of aspirin: Secondary | ICD-10-CM | POA: Diagnosis not present

## 2018-06-16 DIAGNOSIS — Z79899 Other long term (current) drug therapy: Secondary | ICD-10-CM | POA: Diagnosis not present

## 2018-06-16 DIAGNOSIS — C9000 Multiple myeloma not having achieved remission: Secondary | ICD-10-CM

## 2018-06-16 DIAGNOSIS — Z5111 Encounter for antineoplastic chemotherapy: Secondary | ICD-10-CM | POA: Diagnosis not present

## 2018-06-16 DIAGNOSIS — Z5112 Encounter for antineoplastic immunotherapy: Secondary | ICD-10-CM | POA: Diagnosis not present

## 2018-06-16 DIAGNOSIS — M899 Disorder of bone, unspecified: Secondary | ICD-10-CM

## 2018-06-16 DIAGNOSIS — Z9484 Stem cells transplant status: Secondary | ICD-10-CM | POA: Diagnosis not present

## 2018-06-16 LAB — CBC WITH DIFFERENTIAL (CANCER CENTER ONLY)
Abs Immature Granulocytes: 0.01 10*3/uL (ref 0.00–0.07)
Basophils Absolute: 0 10*3/uL (ref 0.0–0.1)
Basophils Relative: 1 %
Eosinophils Absolute: 0 10*3/uL (ref 0.0–0.5)
Eosinophils Relative: 2 %
HCT: 26 % — ABNORMAL LOW (ref 39.0–52.0)
Hemoglobin: 8.8 g/dL — ABNORMAL LOW (ref 13.0–17.0)
Immature Granulocytes: 1 %
Lymphocytes Relative: 32 %
Lymphs Abs: 0.7 10*3/uL (ref 0.7–4.0)
MCH: 34.2 pg — ABNORMAL HIGH (ref 26.0–34.0)
MCHC: 33.8 g/dL (ref 30.0–36.0)
MCV: 101.2 fL — ABNORMAL HIGH (ref 80.0–100.0)
Monocytes Absolute: 0.3 10*3/uL (ref 0.1–1.0)
Monocytes Relative: 16 %
Neutro Abs: 1 10*3/uL — ABNORMAL LOW (ref 1.7–7.7)
Neutrophils Relative %: 48 %
Platelet Count: 93 10*3/uL — ABNORMAL LOW (ref 150–400)
RBC: 2.57 MIL/uL — ABNORMAL LOW (ref 4.22–5.81)
RDW: 16.5 % — ABNORMAL HIGH (ref 11.5–15.5)
WBC Count: 2.1 10*3/uL — ABNORMAL LOW (ref 4.0–10.5)
nRBC: 1 % — ABNORMAL HIGH (ref 0.0–0.2)

## 2018-06-16 LAB — CMP (CANCER CENTER ONLY)
ALT: 10 U/L (ref 0–44)
AST: 14 U/L — ABNORMAL LOW (ref 15–41)
Albumin: 3.8 g/dL (ref 3.5–5.0)
Alkaline Phosphatase: 49 U/L (ref 38–126)
Anion gap: 6 (ref 5–15)
BUN: 20 mg/dL (ref 8–23)
CO2: 27 mmol/L (ref 22–32)
Calcium: 9.7 mg/dL (ref 8.9–10.3)
Chloride: 101 mmol/L (ref 98–111)
Creatinine: 1.09 mg/dL (ref 0.61–1.24)
GFR, Est AFR Am: >60 mL/min (ref >60)
GFR, Estimated: >60 mL/min (ref >60)
Glucose, Bld: 91 mg/dL (ref 70–99)
Potassium: 3.8 mmol/L (ref 3.5–5.1)
Sodium: 134 mmol/L — ABNORMAL LOW (ref 135–145)
Total Bilirubin: 1.1 mg/dL (ref 0.3–1.2)
Total Protein: 6.3 g/dL — ABNORMAL LOW (ref 6.5–8.1)

## 2018-06-16 LAB — LACTATE DEHYDROGENASE: LDH: 201 U/L — ABNORMAL HIGH (ref 98–192)

## 2018-06-16 MED ORDER — SODIUM CHLORIDE 0.9 % IV SOLN
Freq: Once | INTRAVENOUS | Status: DC
  Filled 2018-06-16: qty 250

## 2018-06-16 MED ORDER — DEXTROSE 5 % IV SOLN
68.0000 mg/m2 | Freq: Once | INTRAVENOUS | Status: AC
  Administered 2018-06-16: 150 mg via INTRAVENOUS
  Filled 2018-06-16: qty 60

## 2018-06-16 MED ORDER — ZOLEDRONIC ACID 4 MG/100ML IV SOLN
4.0000 mg | Freq: Once | INTRAVENOUS | Status: AC
  Administered 2018-06-16: 4 mg via INTRAVENOUS
  Filled 2018-06-16: qty 100

## 2018-06-16 MED ORDER — SODIUM CHLORIDE 0.9% FLUSH
10.0000 mL | INTRAVENOUS | Status: DC | PRN
  Administered 2018-06-16: 10 mL
  Filled 2018-06-16: qty 10

## 2018-06-16 MED ORDER — SODIUM CHLORIDE 0.9 % IV SOLN
300.0000 mg/m2 | Freq: Once | INTRAVENOUS | Status: AC
  Administered 2018-06-16: 660 mg via INTRAVENOUS
  Filled 2018-06-16: qty 33

## 2018-06-16 MED ORDER — SODIUM CHLORIDE 0.9 % IV SOLN
Freq: Once | INTRAVENOUS | Status: AC
  Administered 2018-06-16: 12:00:00 via INTRAVENOUS
  Filled 2018-06-16: qty 250

## 2018-06-16 MED ORDER — PROCHLORPERAZINE MALEATE 10 MG PO TABS
ORAL_TABLET | ORAL | Status: AC
  Filled 2018-06-16: qty 1

## 2018-06-16 MED ORDER — PROCHLORPERAZINE MALEATE 10 MG PO TABS
10.0000 mg | ORAL_TABLET | Freq: Once | ORAL | Status: AC
  Administered 2018-06-16: 10 mg via ORAL

## 2018-06-16 MED ORDER — HEPARIN SOD (PORK) LOCK FLUSH 100 UNIT/ML IV SOLN
500.0000 [IU] | Freq: Once | INTRAVENOUS | Status: AC | PRN
  Administered 2018-06-16: 500 [IU]
  Filled 2018-06-16: qty 5

## 2018-06-16 MED ORDER — SODIUM CHLORIDE 0.9 % IV SOLN
20.0000 mg | Freq: Once | INTRAVENOUS | Status: AC
  Administered 2018-06-16: 20 mg via INTRAVENOUS
  Filled 2018-06-16: qty 2

## 2018-06-16 NOTE — Patient Instructions (Signed)

## 2018-06-16 NOTE — Progress Notes (Signed)
OK to treat with platelets of 93 & ANC of 1 per Dr Marin Olp. dph

## 2018-06-16 NOTE — Patient Instructions (Signed)
Cyclophosphamide injection What is this medicine? CYCLOPHOSPHAMIDE (sye kloe FOSS fa mide) is a chemotherapy drug. It slows the growth of cancer cells. This medicine is used to treat many types of cancer like lymphoma, myeloma, leukemia, breast cancer, and ovarian cancer, to name a few. This medicine may be used for other purposes; ask your health care provider or pharmacist if you have questions. COMMON BRAND NAME(S): Cytoxan, Neosar What should I tell my health care provider before I take this medicine? They need to know if you have any of these conditions: -blood disorders -history of other chemotherapy -infection -kidney disease -liver disease -recent or ongoing radiation therapy -tumors in the bone marrow -an unusual or allergic reaction to cyclophosphamide, other chemotherapy, other medicines, foods, dyes, or preservatives -pregnant or trying to get pregnant -breast-feeding How should I use this medicine? This drug is usually given as an injection into a vein or muscle or by infusion into a vein. It is administered in a hospital or clinic by a specially trained health care professional. Talk to your pediatrician regarding the use of this medicine in children. Special care may be needed. Overdosage: If you think you have taken too much of this medicine contact a poison control center or emergency room at once. NOTE: This medicine is only for you. Do not share this medicine with others. What if I miss a dose? It is important not to miss your dose. Call your doctor or health care professional if you are unable to keep an appointment. What may interact with this medicine? This medicine may interact with the following medications: -amiodarone -amphotericin B -azathioprine -certain antiviral medicines for HIV or AIDS such as protease inhibitors (e.g., indinavir, ritonavir) and zidovudine -certain blood pressure medications such as benazepril, captopril, enalapril, fosinopril,  lisinopril, moexipril, monopril, perindopril, quinapril, ramipril, trandolapril -certain cancer medications such as anthracyclines (e.g., daunorubicin, doxorubicin), busulfan, cytarabine, paclitaxel, pentostatin, tamoxifen, trastuzumab -certain diuretics such as chlorothiazide, chlorthalidone, hydrochlorothiazide, indapamide, metolazone -certain medicines that treat or prevent blood clots like warfarin -certain muscle relaxants such as succinylcholine -cyclosporine -etanercept -indomethacin -medicines to increase blood counts like filgrastim, pegfilgrastim, sargramostim -medicines used as general anesthesia -metronidazole -natalizumab This list may not describe all possible interactions. Give your health care provider a list of all the medicines, herbs, non-prescription drugs, or dietary supplements you use. Also tell them if you smoke, drink alcohol, or use illegal drugs. Some items may interact with your medicine. What should I watch for while using this medicine? Visit your doctor for checks on your progress. This drug may make you feel generally unwell. This is not uncommon, as chemotherapy can affect healthy cells as well as cancer cells. Report any side effects. Continue your course of treatment even though you feel ill unless your doctor tells you to stop. Drink water or other fluids as directed. Urinate often, even at night. In some cases, you may be given additional medicines to help with side effects. Follow all directions for their use. Call your doctor or health care professional for advice if you get a fever, chills or sore throat, or other symptoms of a cold or flu. Do not treat yourself. This drug decreases your body's ability to fight infections. Try to avoid being around people who are sick. This medicine may increase your risk to bruise or bleed. Call your doctor or health care professional if you notice any unusual bleeding. Be careful brushing and flossing your teeth or using a  toothpick because you may get an infection or bleed   more easily. If you have any dental work done, tell your dentist you are receiving this medicine. You may get drowsy or dizzy. Do not drive, use machinery, or do anything that needs mental alertness until you know how this medicine affects you. Do not become pregnant while taking this medicine or for 1 year after stopping it. Women should inform their doctor if they wish to become pregnant or think they might be pregnant. Men should not father a child while taking this medicine and for 4 months after stopping it. There is a potential for serious side effects to an unborn child. Talk to your health care professional or pharmacist for more information. Do not breast-feed an infant while taking this medicine. This medicine may interfere with the ability to have a child. This medicine has caused ovarian failure in some women. This medicine has caused reduced sperm counts in some men. You should talk with your doctor or health care professional if you are concerned about your fertility. If you are going to have surgery, tell your doctor or health care professional that you have taken this medicine. What side effects may I notice from receiving this medicine? Side effects that you should report to your doctor or health care professional as soon as possible: -allergic reactions like skin rash, itching or hives, swelling of the face, lips, or tongue -low blood counts - this medicine may decrease the number of white blood cells, red blood cells and platelets. You may be at increased risk for infections and bleeding. -signs of infection - fever or chills, cough, sore throat, pain or difficulty passing urine -signs of decreased platelets or bleeding - bruising, pinpoint red spots on the skin, black, tarry stools, blood in the urine -signs of decreased red blood cells - unusually weak or tired, fainting spells, lightheadedness -breathing problems -dark  urine -dizziness -palpitations -swelling of the ankles, feet, hands -trouble passing urine or change in the amount of urine -weight gain -yellowing of the eyes or skin Side effects that usually do not require medical attention (report to your doctor or health care professional if they continue or are bothersome): -changes in nail or skin color -hair loss -missed menstrual periods -mouth sores -nausea, vomiting This list may not describe all possible side effects. Call your doctor for medical advice about side effects. You may report side effects to FDA at 1-800-FDA-1088. Where should I keep my medicine? This drug is given in a hospital or clinic and will not be stored at home. NOTE: This sheet is a summary. It may not cover all possible information. If you have questions about this medicine, talk to your doctor, pharmacist, or health care provider.  2019 [employer]/Gold Standard (2012-03-25 16:22:58) Carfilzomib injection What is this medicine? CARFILZOMIB (kar FILZ oh mib) targets a specific protein within cancer cells and stops the cancer cells from growing. It is used to treat multiple myeloma. This medicine may be used for other purposes; ask your health care provider or pharmacist if you have questions. COMMON BRAND NAME(S): KYPROLIS What should I tell my health care provider before I take this medicine? They need to know if you have any of these conditions: -heart disease -history of blood clots -irregular heartbeat -kidney disease -liver disease -lung or breathing disease -an unusual or allergic reaction to carfilzomib, or other medicines, foods, dyes, or preservatives -pregnant or trying to get pregnant -breast-feeding How should I use this medicine? This medicine is for injection or infusion into a vein. It is given  by a health care professional in a hospital or clinic setting. Talk to your pediatrician regarding the use of this medicine in children. Special care may be  needed. Overdosage: If you think you have taken too much of this medicine contact a poison control center or emergency room at once. NOTE: This medicine is only for you. Do not share this medicine with others. What if I miss a dose? It is important not to miss your dose. Call your doctor or health care professional if you are unable to keep an appointment. What may interact with this medicine? Interactions are not expected. Give your health care provider a list of all the medicines, herbs, non-prescription drugs, or dietary supplements you use. Also tell them if you smoke, drink alcohol, or use illegal drugs. Some items may interact with your medicine. This list may not describe all possible interactions. Give your health care provider a list of all the medicines, herbs, non-prescription drugs, or dietary supplements you use. Also tell them if you smoke, drink alcohol, or use illegal drugs. Some items may interact with your medicine. What should I watch for while using this medicine? Your condition will be monitored carefully while you are receiving this medicine. Report any side effects. Continue your course of treatment even though you feel ill unless your doctor tells you to stop. You may need blood work done while you are taking this medicine. Do not become pregnant while taking this medicine or for at least 6 months after stopping it. Women should inform their doctor if they wish to become pregnant or think they might be pregnant. There is a potential for serious side effects to an unborn child. Men should not father a child while taking this medicine and for at least 3 months after stopping it. Talk to your health care professional or pharmacist for more information. Do not breast-feed an infant while taking this medicine or for 2 weeks after the last dose. Check with your doctor or health care professional if you get an attack of severe diarrhea, nausea and vomiting, or if you sweat a lot. The  loss of too much body fluid can make it dangerous for you to take this medicine. You may get dizzy. Do not drive, use machinery, or do anything that needs mental alertness until you know how this medicine affects you. Do not stand or sit up quickly, especially if you are an older patient. This reduces the risk of dizzy or fainting spells. What side effects may I notice from receiving this medicine? Side effects that you should report to your doctor or health care professional as soon as possible: -allergic reactions like skin rash, itching or hives, swelling of the face, lips, or tongue -confusion -dizziness -feeling faint or lightheaded -fever or chills -palpitations -seizures -signs and symptoms of bleeding such as bloody or black, tarry stools; red or dark-brown urine; spitting up blood or brown material that looks like coffee grounds; red spots on the skin; unusual bruising or bleeding including from the eye, gums, or nose -signs and symptoms of a blood clot such as breathing problems; changes in vision; chest pain; severe, sudden headache; pain, swelling, warmth in the leg; trouble speaking; sudden numbness or weakness of the face, arm or leg -signs and symptoms of kidney injury like trouble passing urine or change in the amount of urine -signs and symptoms of liver injury like dark yellow or brown urine; general ill feeling or flu-like symptoms; light-colored stools; loss of appetite; nausea; right   upper belly pain; unusually weak or tired; yellowing of the eyes or skin Side effects that usually do not require medical attention (report to your doctor or health care professional if they continue or are bothersome): -back pain -cough -diarrhea -headache -muscle cramps -vomiting This list may not describe all possible side effects. Call your doctor for medical advice about side effects. You may report side effects to FDA at 1-800-FDA-1088. Where should I keep my medicine? This drug is  given in a hospital or clinic and will not be stored at home. NOTE: This sheet is a summary. It may not cover all possible information. If you have questions about this medicine, talk to your doctor, pharmacist, or health care provider.  2019 [employer]vier/Gold Standard (2017-02-24 14:07:13)  

## 2018-06-30 ENCOUNTER — Other Ambulatory Visit: Payer: Self-pay | Admitting: Family

## 2018-06-30 ENCOUNTER — Inpatient Hospital Stay: Payer: Medicare Other

## 2018-06-30 ENCOUNTER — Other Ambulatory Visit: Payer: Self-pay

## 2018-06-30 ENCOUNTER — Encounter: Payer: Self-pay | Admitting: Family

## 2018-06-30 ENCOUNTER — Inpatient Hospital Stay: Payer: Medicare Other | Attending: Hematology & Oncology | Admitting: Family

## 2018-06-30 VITALS — BP 196/80 | HR 65 | Temp 97.6°F | Resp 19 | Wt 214.0 lb

## 2018-06-30 VITALS — BP 196/80 | HR 63 | Temp 97.6°F | Resp 20

## 2018-06-30 DIAGNOSIS — Z5112 Encounter for antineoplastic immunotherapy: Secondary | ICD-10-CM | POA: Insufficient documentation

## 2018-06-30 DIAGNOSIS — I10 Essential (primary) hypertension: Secondary | ICD-10-CM | POA: Insufficient documentation

## 2018-06-30 DIAGNOSIS — D508 Other iron deficiency anemias: Secondary | ICD-10-CM

## 2018-06-30 DIAGNOSIS — Z9484 Stem cells transplant status: Secondary | ICD-10-CM

## 2018-06-30 DIAGNOSIS — C9002 Multiple myeloma in relapse: Secondary | ICD-10-CM | POA: Insufficient documentation

## 2018-06-30 DIAGNOSIS — Z79899 Other long term (current) drug therapy: Secondary | ICD-10-CM | POA: Insufficient documentation

## 2018-06-30 DIAGNOSIS — Z95828 Presence of other vascular implants and grafts: Secondary | ICD-10-CM

## 2018-06-30 DIAGNOSIS — Z7982 Long term (current) use of aspirin: Secondary | ICD-10-CM | POA: Diagnosis not present

## 2018-06-30 LAB — CBC WITH DIFFERENTIAL (CANCER CENTER ONLY)
Basophils Absolute: 0 10*3/uL (ref 0.0–0.1)
Basophils Relative: 1 %
Eosinophils Absolute: 0 10*3/uL (ref 0.0–0.5)
Eosinophils Relative: 1 %
HCT: 25.7 % — ABNORMAL LOW (ref 39.0–52.0)
Hemoglobin: 8.5 g/dL — ABNORMAL LOW (ref 13.0–17.0)
Lymphocytes Relative: 21 %
Lymphs Abs: 0.5 10*3/uL — ABNORMAL LOW (ref 0.7–4.0)
MCH: 34.1 pg — ABNORMAL HIGH (ref 26.0–34.0)
MCHC: 33.1 g/dL (ref 30.0–36.0)
MCV: 103.2 fL — ABNORMAL HIGH (ref 80.0–100.0)
Monocytes Absolute: 0.5 10*3/uL (ref 0.1–1.0)
Monocytes Relative: 19 %
Neutro Abs: 1.5 10*3/uL — ABNORMAL LOW (ref 1.7–7.7)
Neutrophils Relative %: 58 %
Platelet Count: 168 10*3/uL (ref 150–400)
RBC: 2.49 MIL/uL — ABNORMAL LOW (ref 4.22–5.81)
RDW: 16.3 % — ABNORMAL HIGH (ref 11.5–15.5)
WBC Count: 2.5 10*3/uL — ABNORMAL LOW (ref 4.0–10.5)
nRBC: 0 % (ref 0.0–0.2)

## 2018-06-30 LAB — LACTATE DEHYDROGENASE: LDH: 215 U/L — ABNORMAL HIGH (ref 98–192)

## 2018-06-30 LAB — CMP (CANCER CENTER ONLY)
ALT: 10 U/L (ref 0–44)
AST: 15 U/L (ref 15–41)
Albumin: 4.1 g/dL (ref 3.5–5.0)
Alkaline Phosphatase: 57 U/L (ref 38–126)
Anion gap: 5 (ref 5–15)
BUN: 16 mg/dL (ref 8–23)
CO2: 27 mmol/L (ref 22–32)
Calcium: 9.8 mg/dL (ref 8.9–10.3)
Chloride: 103 mmol/L (ref 98–111)
Creatinine: 1 mg/dL (ref 0.61–1.24)
GFR, Est AFR Am: >60 mL/min (ref >60)
GFR, Estimated: >60 mL/min (ref >60)
Glucose, Bld: 104 mg/dL — ABNORMAL HIGH (ref 70–99)
Potassium: 3.7 mmol/L (ref 3.5–5.1)
Sodium: 135 mmol/L (ref 135–145)
Total Bilirubin: 0.8 mg/dL (ref 0.3–1.2)
Total Protein: 6.6 g/dL (ref 6.5–8.1)

## 2018-06-30 LAB — SAMPLE TO BLOOD BANK

## 2018-06-30 LAB — RETICULOCYTES
Immature Retic Fract: 20.2 % — ABNORMAL HIGH (ref 2.3–15.9)
RBC.: 2.49 MIL/uL — ABNORMAL LOW (ref 4.22–5.81)
Retic Count, Absolute: 68.2 10*3/uL (ref 19.0–186.0)
Retic Ct Pct: 2.7 % (ref 0.4–3.1)

## 2018-06-30 MED ORDER — HEPARIN SOD (PORK) LOCK FLUSH 100 UNIT/ML IV SOLN
500.0000 [IU] | Freq: Once | INTRAVENOUS | Status: AC | PRN
  Administered 2018-06-30: 500 [IU]
  Filled 2018-06-30: qty 5

## 2018-06-30 MED ORDER — SODIUM CHLORIDE 0.9 % IV SOLN
300.0000 mg/m2 | Freq: Once | INTRAVENOUS | Status: AC
  Administered 2018-06-30: 660 mg via INTRAVENOUS
  Filled 2018-06-30: qty 33

## 2018-06-30 MED ORDER — SODIUM CHLORIDE 0.9% FLUSH
10.0000 mL | INTRAVENOUS | Status: DC | PRN
  Administered 2018-06-30: 10 mL
  Filled 2018-06-30: qty 10

## 2018-06-30 MED ORDER — SODIUM CHLORIDE 0.9% FLUSH
10.0000 mL | INTRAVENOUS | Status: DC | PRN
  Filled 2018-06-30: qty 10

## 2018-06-30 MED ORDER — SODIUM CHLORIDE 0.9 % IV SOLN
Freq: Once | INTRAVENOUS | Status: AC
  Administered 2018-06-30: 13:00:00 via INTRAVENOUS
  Filled 2018-06-30: qty 250

## 2018-06-30 MED ORDER — SODIUM CHLORIDE 0.9 % IV SOLN
20.0000 mg | Freq: Once | INTRAVENOUS | Status: AC
  Administered 2018-06-30: 20 mg via INTRAVENOUS
  Filled 2018-06-30: qty 2

## 2018-06-30 MED ORDER — SODIUM CHLORIDE 0.9 % IV SOLN
Freq: Once | INTRAVENOUS | Status: DC
  Filled 2018-06-30: qty 250

## 2018-06-30 MED ORDER — PROCHLORPERAZINE MALEATE 10 MG PO TABS
10.0000 mg | ORAL_TABLET | Freq: Once | ORAL | Status: AC
  Administered 2018-06-30: 10 mg via ORAL

## 2018-06-30 MED ORDER — PROCHLORPERAZINE MALEATE 10 MG PO TABS
ORAL_TABLET | ORAL | Status: AC
  Filled 2018-06-30: qty 1

## 2018-06-30 MED ORDER — DEXTROSE 5 % IV SOLN
68.0000 mg/m2 | Freq: Once | INTRAVENOUS | Status: DC
  Filled 2018-06-30: qty 75

## 2018-06-30 NOTE — Progress Notes (Signed)
Hematology and Oncology Follow Up Visit  KAMARE CASPERS [id number] 1933-12-21 85 y.o. 06/30/2018   Principle Diagnosis:  IgG Kappa Myeloma-Relapsed - Trisomy 11, 13q-  Past Therapy: RVD -S/p cycle #3 - revlimid on hold - d/c on 05/12/2018  Current Therapy:   KyCyD - started 06/02/2018 s/p cycle 1 Zometa 4 mg IV q 63months - next dose on 07/2018   Interim History:  Mr. Massi is here today for follow-up and treatment. His BP is quite elevated (196/80) and he states that he took his scheduled anti hypertensive medications this morning. He also took a clonidine right before he left to come here. He states that it usually takes around 3 hours before it takes effects. Dr. Marin Olp is aware.  He is asymptomatic at this time. No headache, blurred vision or dizziness.  In January his M-spike was 1.4, IgG 1,879 mg/dL and kappa light chains 12.69 mg/dL.  No fever, chills, n/v, cough, rash, dizziness, SOB, chest pain, palpitations, abdominal pain or changes in bowel or bladder habits.  No swelling, tenderness, numbness or tingling in his extremities at this time.  No lymphadenopathy noted on exam.  He has maintained a good appetite and is staying well hydrated. His weight is stable.   ECOG Performance Status: 2 - Symptomatic, <50% confined to bed  Medications:  Allergies as of 06/30/2018   No Known Allergies     Medication List       Accurate as of June 30, 2018 12:13 PM. Always use your most recent med list.        aspirin EC 325 MG tablet Take 1 tablet (325 mg total) by mouth daily.   bimatoprost 0.01 % Soln Commonly known as:  LUMIGAN Place 1 drop into both eyes at bedtime.   CALTRATE 600+D PLUS PO Take 1 tablet by mouth daily.   carvedilol 25 MG tablet Commonly known as:  COREG Take 25 mg by mouth 2 (two) times daily with a meal.   cloNIDine 0.1 MG tablet Commonly known as:  CATAPRES Take 0.1 mg by mouth 2 (two) times daily as needed (elevated blood pressure >190/80).     [employer] VITAMIN B12 2000 MCG tablet Generic drug:  cyanocobalamin Take 2,000 mcg by mouth at bedtime.   dexamethasone 4 MG tablet Commonly known as:  DECADRON Take 5 tablets (20 mg total) by mouth once a week.   famciclovir 250 MG tablet Commonly known as:  FAMVIR Take 1 tablet (250 mg total) by mouth daily.   hydrochlorothiazide 25 MG tablet Commonly known as:  HYDRODIURIL Take 25 mg by mouth daily.   losartan 100 MG tablet Commonly known as:  COZAAR Take 100 mg by mouth daily.   multivitamin with minerals Tabs tablet Take 1 tablet by mouth daily.   ondansetron 8 MG tablet Commonly known as:  ZOFRAN Take 1 tablet (8 mg total) by mouth every 8 (eight) hours as needed for nausea or vomiting.   potassium chloride SA 20 MEQ tablet Commonly known as:  K-DUR,KLOR-CON Take 20 mEq by mouth daily.   pravastatin 20 MG tablet Commonly known as:  PRAVACHOL Take 20 mg by mouth at bedtime.   prochlorperazine 10 MG tablet Commonly known as:  COMPAZINE Take 1 tablet (10 mg total) by mouth every 6 (six) hours as needed for nausea or vomiting.   SYSTANE OP Place 1 drop into both eyes daily as needed (dry eyes).   tadalafil 20 MG tablet Commonly known as:  ADCIRCA/CIALIS Take 20 mg by mouth  daily as needed for erectile dysfunction.   Vitamin D3 25 MCG (1000 UT) Caps Take 1,000 Units by mouth daily.   ZOMETA 4 MG/100ML IVPB Generic drug:  Zoledronic Acid Inject 4 mg into the vein every 4 (four) months. Administered by Dr. Jonette Eva at [facility] - last infusion mid July 2019       Allergies: No Known Allergies  Past Medical History, Surgical history, Social history, and Family History were reviewed and updated.  Review of Systems: All other 10 point review of systems is negative.   Physical Exam:  weight is 214 lb (97.1 kg). His oral temperature is 97.6 F (36.4 C). His blood pressure is 196/80 (abnormal) and his pulse is 65. His respiration is 19 and oxygen  saturation is 100%.   Wt Readings from Last 3 Encounters:  06/30/18 214 lb (97.1 kg)  06/02/18 213 lb (96.6 kg)  04/28/18 214 lb 6.4 oz (97.3 kg)    Ocular: Sclerae unicteric, pupils equal, round and reactive to light Ear-nose-throat: Oropharynx clear, dentition fair Lymphatic: No cervical, supraclavicular or axillary adenopathy Lungs no rales or rhonchi, good excursion bilaterally Heart regular rate and rhythm, no murmur appreciated Abd soft, nontender, positive bowel sounds, no liver or spleen tip palpated on exam, no fluid wave  MSK no focal spinal tenderness, no joint edema Neuro: non-focal, well-oriented, appropriate affect Breasts: Deferred   Lab Results  Component Value Date   WBC 2.5 (L) 06/30/2018   HGB 8.5 (L) 06/30/2018   HCT 25.7 (L) 06/30/2018   MCV 103.2 (H) 06/30/2018   PLT 168 06/30/2018   Lab Results  Component Value Date   FERRITIN 466 (H) 06/02/2018   IRON 90 06/02/2018   TIBC 274 06/02/2018   UIBC 184 06/02/2018   IRONPCTSAT 33 06/02/2018   Lab Results  Component Value Date   RETICCTPCT 2.7 06/30/2018   RBC 2.49 (L) 06/30/2018   RBC 2.49 (L) 06/30/2018   Lab Results  Component Value Date   KPAFRELGTCHN 126.9 (H) 06/02/2018   LAMBDASER 8.9 06/02/2018   KAPLAMBRATIO 14.26 (H) 06/02/2018   Lab Results  Component Value Date   IGGSERUM 1,879 (H) 06/02/2018   IGGSERUM 1,811 (H) 06/02/2018   IGA 39 (L) 06/02/2018   IGA 41 (L) 06/02/2018   IGMSERUM 57 06/02/2018   IGMSERUM 59 06/02/2018   Lab Results  Component Value Date   TOTALPROTELP 6.8 06/02/2018   ALBUMINELP 3.7 06/02/2018   A1GS 0.2 06/02/2018   A2GS 0.7 06/02/2018   BETS 0.7 06/02/2018   BETA2SER 0.3 01/03/2015   GAMS 1.6 06/02/2018   MSPIKE 1.4 (H) 06/02/2018   SPEI * 01/03/2015     Chemistry      Component Value Date/Time   NA 135 06/30/2018 1115   NA 137 04/08/2017 1141   NA 138 10/29/2015 1029   K 3.7 06/30/2018 1115   K 3.4 04/08/2017 1141   K 3.9 10/29/2015 1029    CL 103 06/30/2018 1115   CL 102 04/08/2017 1141   CO2 27 06/30/2018 1115   CO2 30 04/08/2017 1141   CO2 27 10/29/2015 1029   BUN 16 06/30/2018 1115   BUN 16 04/08/2017 1141   BUN 13.8 10/29/2015 1029   CREATININE 1.00 06/30/2018 1115   CREATININE 1.3 (H) 04/08/2017 1141   CREATININE 1.1 10/29/2015 1029      Component Value Date/Time   CALCIUM 9.8 06/30/2018 1115   CALCIUM 9.3 04/08/2017 1141   CALCIUM 9.3 10/29/2015 1029   ALKPHOS  57 06/30/2018 1115   ALKPHOS 66 04/08/2017 1141   ALKPHOS 52 10/29/2015 1029   AST 15 06/30/2018 1115   AST 20 10/29/2015 1029   ALT 10 06/30/2018 1115   ALT 21 04/08/2017 1141   ALT 14 10/29/2015 1029   BILITOT 0.8 06/30/2018 1115   BILITOT 0.75 10/29/2015 1029       Impression and Plan: Mr. Markovic is a very pleasant 85 yo African American gentleman with IgG kappa myeloma with history of stem cell transplant in 2006.  He is doing well but has some HTN today. I spoke with Dr. Marin Olp and since the patient has taken his anti hypertensive medication including his PRN clonidine we will proceed with treatment today as planned.  Myeloma studies are pending.  We will recheck his BP prior to him leaving today.  He has his current treatment and appointment schedule and we will see him back in another month.  They will contact our office with any questions or concerns. We can certainly see him sooner if need be.   Laverna Peace, NP 2/6/202012:13 PM

## 2018-06-30 NOTE — Patient Instructions (Signed)
[facility] Discharge Instructions for Patients Receiving Chemotherapy  Today you received the following chemotherapy agents Cytoxan and Kryprolis  To help prevent nausea and vomiting after your treatment, we encourage you to take your nausea medicationas directed  If you develop nausea and vomiting that is not controlled by your nausea medication, call the clinic.   BELOW ARE SYMPTOMS THAT SHOULD BE REPORTED IMMEDIATELY:  *FEVER GREATER THAN 100.5 F  *CHILLS WITH OR WITHOUT FEVER  NAUSEA AND VOMITING THAT IS NOT CONTROLLED WITH YOUR NAUSEA MEDICATION  *UNUSUAL SHORTNESS OF BREATH  *UNUSUAL BRUISING OR BLEEDING  TENDERNESS IN MOUTH AND THROAT WITH OR WITHOUT PRESENCE OF ULCERS  *URINARY PROBLEMS  *BOWEL PROBLEMS  UNUSUAL RASH Items with * indicate a potential emergency and should be followed up as soon as possible.  Feel free to call the clinic should you have any questions or concerns. The clinic phone number is [phone].  Please show the [facility] at check-in to the Emergency Department and triage nurse.

## 2018-06-30 NOTE — Patient Instructions (Signed)

## 2018-06-30 NOTE — Patient Instructions (Signed)
Cyclophosphamide injection What is this medicine? CYCLOPHOSPHAMIDE (sye kloe FOSS fa mide) is a chemotherapy drug. It slows the growth of cancer cells. This medicine is used to treat many types of cancer like lymphoma, myeloma, leukemia, breast cancer, and ovarian cancer, to name a few. This medicine may be used for other purposes; ask your health care provider or pharmacist if you have questions. COMMON BRAND NAME(S): Cytoxan, Neosar What should I tell my health care provider before I take this medicine? They need to know if you have any of these conditions: -blood disorders -history of other chemotherapy -infection -kidney disease -liver disease -recent or ongoing radiation therapy -tumors in the bone marrow -an unusual or allergic reaction to cyclophosphamide, other chemotherapy, other medicines, foods, dyes, or preservatives -pregnant or trying to get pregnant -breast-feeding How should I use this medicine? This drug is usually given as an injection into a vein or muscle or by infusion into a vein. It is administered in a hospital or clinic by a specially trained health care professional. Talk to your pediatrician regarding the use of this medicine in children. Special care may be needed. Overdosage: If you think you have taken too much of this medicine contact a poison control center or emergency room at once. NOTE: This medicine is only for you. Do not share this medicine with others. What if I miss a dose? It is important not to miss your dose. Call your doctor or health care professional if you are unable to keep an appointment. What may interact with this medicine? This medicine may interact with the following medications: -amiodarone -amphotericin B -azathioprine -certain antiviral medicines for HIV or AIDS such as protease inhibitors (e.g., indinavir, ritonavir) and zidovudine -certain blood pressure medications such as benazepril, captopril, enalapril, fosinopril,  lisinopril, moexipril, monopril, perindopril, quinapril, ramipril, trandolapril -certain cancer medications such as anthracyclines (e.g., daunorubicin, doxorubicin), busulfan, cytarabine, paclitaxel, pentostatin, tamoxifen, trastuzumab -certain diuretics such as chlorothiazide, chlorthalidone, hydrochlorothiazide, indapamide, metolazone -certain medicines that treat or prevent blood clots like warfarin -certain muscle relaxants such as succinylcholine -cyclosporine -etanercept -indomethacin -medicines to increase blood counts like filgrastim, pegfilgrastim, sargramostim -medicines used as general anesthesia -metronidazole -natalizumab This list may not describe all possible interactions. Give your health care provider a list of all the medicines, herbs, non-prescription drugs, or dietary supplements you use. Also tell them if you smoke, drink alcohol, or use illegal drugs. Some items may interact with your medicine. What should I watch for while using this medicine? Visit your doctor for checks on your progress. This drug may make you feel generally unwell. This is not uncommon, as chemotherapy can affect healthy cells as well as cancer cells. Report any side effects. Continue your course of treatment even though you feel ill unless your doctor tells you to stop. Drink water or other fluids as directed. Urinate often, even at night. In some cases, you may be given additional medicines to help with side effects. Follow all directions for their use. Call your doctor or health care professional for advice if you get a fever, chills or sore throat, or other symptoms of a cold or flu. Do not treat yourself. This drug decreases your body's ability to fight infections. Try to avoid being around people who are sick. This medicine may increase your risk to bruise or bleed. Call your doctor or health care professional if you notice any unusual bleeding. Be careful brushing and flossing your teeth or using a  toothpick because you may get an infection or bleed   more easily. If you have any dental work done, tell your dentist you are receiving this medicine. You may get drowsy or dizzy. Do not drive, use machinery, or do anything that needs mental alertness until you know how this medicine affects you. Do not become pregnant while taking this medicine or for 1 year after stopping it. Women should inform their doctor if they wish to become pregnant or think they might be pregnant. Men should not father a child while taking this medicine and for 4 months after stopping it. There is a potential for serious side effects to an unborn child. Talk to your health care professional or pharmacist for more information. Do not breast-feed an infant while taking this medicine. This medicine may interfere with the ability to have a child. This medicine has caused ovarian failure in some women. This medicine has caused reduced sperm counts in some men. You should talk with your doctor or health care professional if you are concerned about your fertility. If you are going to have surgery, tell your doctor or health care professional that you have taken this medicine. What side effects may I notice from receiving this medicine? Side effects that you should report to your doctor or health care professional as soon as possible: -allergic reactions like skin rash, itching or hives, swelling of the face, lips, or tongue -low blood counts - this medicine may decrease the number of white blood cells, red blood cells and platelets. You may be at increased risk for infections and bleeding. -signs of infection - fever or chills, cough, sore throat, pain or difficulty passing urine -signs of decreased platelets or bleeding - bruising, pinpoint red spots on the skin, black, tarry stools, blood in the urine -signs of decreased red blood cells - unusually weak or tired, fainting spells, lightheadedness -breathing problems -dark  urine -dizziness -palpitations -swelling of the ankles, feet, hands -trouble passing urine or change in the amount of urine -weight gain -yellowing of the eyes or skin Side effects that usually do not require medical attention (report to your doctor or health care professional if they continue or are bothersome): -changes in nail or skin color -hair loss -missed menstrual periods -mouth sores -nausea, vomiting This list may not describe all possible side effects. Call your doctor for medical advice about side effects. You may report side effects to FDA at 1-800-FDA-1088. Where should I keep my medicine? This drug is given in a hospital or clinic and will not be stored at home. NOTE: This sheet is a summary. It may not cover all possible information. If you have questions about this medicine, talk to your doctor, pharmacist, or health care provider.  2019 [employer]/Gold Standard (2012-03-25 16:22:58) Carfilzomib injection What is this medicine? CARFILZOMIB (kar FILZ oh mib) targets a specific protein within cancer cells and stops the cancer cells from growing. It is used to treat multiple myeloma. This medicine may be used for other purposes; ask your health care provider or pharmacist if you have questions. COMMON BRAND NAME(S): KYPROLIS What should I tell my health care provider before I take this medicine? They need to know if you have any of these conditions: -heart disease -history of blood clots -irregular heartbeat -kidney disease -liver disease -lung or breathing disease -an unusual or allergic reaction to carfilzomib, or other medicines, foods, dyes, or preservatives -pregnant or trying to get pregnant -breast-feeding How should I use this medicine? This medicine is for injection or infusion into a vein. It is given  by a health care professional in a hospital or clinic setting. Talk to your pediatrician regarding the use of this medicine in children. Special care may be  needed. Overdosage: If you think you have taken too much of this medicine contact a poison control center or emergency room at once. NOTE: This medicine is only for you. Do not share this medicine with others. What if I miss a dose? It is important not to miss your dose. Call your doctor or health care professional if you are unable to keep an appointment. What may interact with this medicine? Interactions are not expected. Give your health care provider a list of all the medicines, herbs, non-prescription drugs, or dietary supplements you use. Also tell them if you smoke, drink alcohol, or use illegal drugs. Some items may interact with your medicine. This list may not describe all possible interactions. Give your health care provider a list of all the medicines, herbs, non-prescription drugs, or dietary supplements you use. Also tell them if you smoke, drink alcohol, or use illegal drugs. Some items may interact with your medicine. What should I watch for while using this medicine? Your condition will be monitored carefully while you are receiving this medicine. Report any side effects. Continue your course of treatment even though you feel ill unless your doctor tells you to stop. You may need blood work done while you are taking this medicine. Do not become pregnant while taking this medicine or for at least 6 months after stopping it. Women should inform their doctor if they wish to become pregnant or think they might be pregnant. There is a potential for serious side effects to an unborn child. Men should not father a child while taking this medicine and for at least 3 months after stopping it. Talk to your health care professional or pharmacist for more information. Do not breast-feed an infant while taking this medicine or for 2 weeks after the last dose. Check with your doctor or health care professional if you get an attack of severe diarrhea, nausea and vomiting, or if you sweat a lot. The  loss of too much body fluid can make it dangerous for you to take this medicine. You may get dizzy. Do not drive, use machinery, or do anything that needs mental alertness until you know how this medicine affects you. Do not stand or sit up quickly, especially if you are an older patient. This reduces the risk of dizzy or fainting spells. What side effects may I notice from receiving this medicine? Side effects that you should report to your doctor or health care professional as soon as possible: -allergic reactions like skin rash, itching or hives, swelling of the face, lips, or tongue -confusion -dizziness -feeling faint or lightheaded -fever or chills -palpitations -seizures -signs and symptoms of bleeding such as bloody or black, tarry stools; red or dark-brown urine; spitting up blood or brown material that looks like coffee grounds; red spots on the skin; unusual bruising or bleeding including from the eye, gums, or nose -signs and symptoms of a blood clot such as breathing problems; changes in vision; chest pain; severe, sudden headache; pain, swelling, warmth in the leg; trouble speaking; sudden numbness or weakness of the face, arm or leg -signs and symptoms of kidney injury like trouble passing urine or change in the amount of urine -signs and symptoms of liver injury like dark yellow or brown urine; general ill feeling or flu-like symptoms; light-colored stools; loss of appetite; nausea; right   upper belly pain; unusually weak or tired; yellowing of the eyes or skin Side effects that usually do not require medical attention (report to your doctor or health care professional if they continue or are bothersome): -back pain -cough -diarrhea -headache -muscle cramps -vomiting This list may not describe all possible side effects. Call your doctor for medical advice about side effects. You may report side effects to FDA at 1-800-FDA-1088. Where should I keep my medicine? This drug is  given in a hospital or clinic and will not be stored at home. NOTE: This sheet is a summary. It may not cover all possible information. If you have questions about this medicine, talk to your doctor, pharmacist, or health care provider.  2019 [employer]vier/Gold Standard (2017-02-24 14:07:13)  

## 2018-06-30 NOTE — Progress Notes (Signed)
OK to treat per Dr. Marin Olp with BP-210/100.

## 2018-07-01 LAB — FERRITIN: Ferritin: 601 ng/mL — ABNORMAL HIGH (ref 24–336)

## 2018-07-01 LAB — IRON AND TIBC
Iron: 62 ug/dL (ref 42–163)
Saturation Ratios: 23 % (ref 20–55)
TIBC: 274 ug/dL (ref 202–409)
UIBC: 211 ug/dL (ref 117–376)

## 2018-07-01 LAB — KAPPA/LAMBDA LIGHT CHAINS
Kappa free light chain: 34.4 mg/L — ABNORMAL HIGH (ref 3.3–19.4)
Kappa, lambda light chain ratio: 5.29 — ABNORMAL HIGH (ref 0.26–1.65)
Lambda free light chains: 6.5 mg/L (ref 5.7–26.3)

## 2018-07-01 LAB — IGG, IGA, IGM
IgA: 28 mg/dL — ABNORMAL LOW (ref 61–437)
IgG (Immunoglobin G), Serum: 1388 mg/dL (ref 700–1600)
IgM (Immunoglobulin M), Srm: 43 mg/dL (ref 15–143)

## 2018-07-04 LAB — IMMUNOFIXATION REFLEX, SERUM
IgA: 29 mg/dL — ABNORMAL LOW (ref 61–437)
IgG (Immunoglobin G), Serum: 1423 mg/dL (ref 700–1600)
IgM (Immunoglobulin M), Srm: 51 mg/dL (ref 15–143)

## 2018-07-04 LAB — PROTEIN ELECTROPHORESIS, SERUM, WITH REFLEX
A/G Ratio: 1.2 (ref 0.7–1.7)
Albumin ELP: 3.5 g/dL (ref 2.9–4.4)
Alpha-1-Globulin: 0.2 g/dL (ref 0.0–0.4)
Alpha-2-Globulin: 0.7 g/dL (ref 0.4–1.0)
Beta Globulin: 0.7 g/dL (ref 0.7–1.3)
Gamma Globulin: 1.3 g/dL (ref 0.4–1.8)
Globulin, Total: 2.9 g/dL (ref 2.2–3.9)
M-Spike, %: 1 g/dL — ABNORMAL HIGH
SPEP Interpretation: 0
Total Protein ELP: 6.4 g/dL (ref 6.0–8.5)

## 2018-07-05 ENCOUNTER — Encounter (HOSPITAL_COMMUNITY): Payer: Self-pay | Admitting: *Deleted

## 2018-07-05 ENCOUNTER — Other Ambulatory Visit: Payer: Self-pay

## 2018-07-05 ENCOUNTER — Emergency Department (HOSPITAL_COMMUNITY)
Admission: EM | Admit: 2018-07-05 | Discharge: 2018-07-05 | Disposition: A | Discharge status: Home / Self Care | Payer: Medicare Other | Attending: Emergency Medicine | Admitting: Emergency Medicine

## 2018-07-05 DIAGNOSIS — C9000 Multiple myeloma not having achieved remission: Secondary | ICD-10-CM | POA: Diagnosis not present

## 2018-07-05 DIAGNOSIS — I10 Essential (primary) hypertension: Secondary | ICD-10-CM | POA: Diagnosis not present

## 2018-07-05 DIAGNOSIS — Z79899 Other long term (current) drug therapy: Secondary | ICD-10-CM | POA: Diagnosis not present

## 2018-07-05 DIAGNOSIS — Z87891 Personal history of nicotine dependence: Secondary | ICD-10-CM | POA: Diagnosis not present

## 2018-07-05 DIAGNOSIS — Z7982 Long term (current) use of aspirin: Secondary | ICD-10-CM | POA: Insufficient documentation

## 2018-07-05 LAB — BASIC METABOLIC PANEL
Anion gap: 7 (ref 5–15)
BUN: 24 mg/dL — ABNORMAL HIGH (ref 8–23)
CO2: 26 mmol/L (ref 22–32)
Calcium: 9.6 mg/dL (ref 8.9–10.3)
Chloride: 102 mmol/L (ref 98–111)
Creatinine, Ser: 1.01 mg/dL (ref 0.61–1.24)
GFR calc Af Amer: >60 mL/min (ref >60)
GFR calc non Af Amer: >60 mL/min (ref >60)
Glucose, Bld: 93 mg/dL (ref 70–99)
Potassium: 3.6 mmol/L (ref 3.5–5.1)
Sodium: 135 mmol/L (ref 135–145)

## 2018-07-05 LAB — URINALYSIS, ROUTINE W REFLEX MICROSCOPIC
Bacteria, UA: NONE SEEN
Bilirubin Urine: NEGATIVE
Glucose, UA: NEGATIVE mg/dL
Ketones, ur: NEGATIVE mg/dL
Leukocytes,Ua: NEGATIVE
Nitrite: NEGATIVE
Protein, ur: NEGATIVE mg/dL
Specific Gravity, Urine: 1.005 (ref 1.005–1.030)
pH: 6 (ref 5.0–8.0)

## 2018-07-05 LAB — CBC WITH DIFFERENTIAL/PLATELET
Abs Immature Granulocytes: 0.09 10*3/uL — ABNORMAL HIGH (ref 0.00–0.07)
Basophils Absolute: 0 10*3/uL (ref 0.0–0.1)
Basophils Relative: 0 %
Eosinophils Absolute: 0.1 10*3/uL (ref 0.0–0.5)
Eosinophils Relative: 1 %
HCT: 26.1 % — ABNORMAL LOW (ref 39.0–52.0)
Hemoglobin: 8.6 g/dL — ABNORMAL LOW (ref 13.0–17.0)
Immature Granulocytes: 2 %
Lymphocytes Relative: 12 %
Lymphs Abs: 0.5 10*3/uL — ABNORMAL LOW (ref 0.7–4.0)
MCH: 34.1 pg — ABNORMAL HIGH (ref 26.0–34.0)
MCHC: 33 g/dL (ref 30.0–36.0)
MCV: 103.6 fL — ABNORMAL HIGH (ref 80.0–100.0)
Monocytes Absolute: 0.8 10*3/uL (ref 0.1–1.0)
Monocytes Relative: 19 %
Neutro Abs: 2.5 10*3/uL (ref 1.7–7.7)
Neutrophils Relative %: 66 %
Platelets: 93 10*3/uL — ABNORMAL LOW (ref 150–400)
RBC: 2.52 MIL/uL — ABNORMAL LOW (ref 4.22–5.81)
RDW: 15.6 % — ABNORMAL HIGH (ref 11.5–15.5)
WBC: 3.9 10*3/uL — ABNORMAL LOW (ref 4.0–10.5)
nRBC: 1 % — ABNORMAL HIGH (ref 0.0–0.2)

## 2018-07-05 MED ORDER — CLONIDINE HCL 0.1 MG PO TABS
0.1000 mg | ORAL_TABLET | Freq: Once | ORAL | Status: AC
  Administered 2018-07-05: 0.1 mg via ORAL
  Filled 2018-07-05: qty 1

## 2018-07-05 NOTE — ED Provider Notes (Signed)
[facility] DEPT Provider Note   CSN: [id number] Arrival date & time: 07/05/18  1155     History   Chief Complaint Chief Complaint  Patient presents with  . Hypertension    HPI Robert [name] is a 85 y.o. male.  He has a history of hypertension and multiple myeloma.  He started chemotherapy with Dr. Earley Favor in January.  He has had troubles with elevated blood pressures since last week.  He says his baseline is usually 150/90.  During his last chemotherapy he was 200/90 and he has been keeping records of this at home.  He shows me the list and looks like he checks it 5 times a day at least.  He denies any headache dizziness blurry vision chest pain shortness of breath abdominal pain or urinary symptoms.  There is been no other change in his medications.  The history is provided by the patient.  Hypertension  This is a chronic problem. The current episode started more than 1 week ago. The problem occurs hourly. The problem has not changed since onset.Pertinent negatives include no chest pain, no abdominal pain, no headaches and no shortness of breath. Nothing aggravates the symptoms. Nothing relieves the symptoms. He has tried nothing for the symptoms. The treatment provided no relief.    Past Medical History:  Diagnosis Date  . Arthritis   . Goals of care, counseling/discussion 05/12/2018  . Hyperlipidemia   . Hypertension   . Multiple myeloma ([place])    2006    Patient Active Problem List   Diagnosis Date Noted  . Goals of care, counseling/discussion 05/12/2018  . Syncope 12/24/2017  . Multiple myeloma in relapse ([place]) 07/30/2016  . Lytic bone lesions on xray 03/17/2016  . Myeloma ([place]) 07/02/2011    Past Surgical History:  Procedure Laterality Date  . CATARACT EXTRACTION BILATERAL W/ ANTERIOR VITRECTOMY    . IR IMAGING GUIDED PORT INSERTION  05/30/2018  . LIMBAL STEM CELL TRANSPLANT          Home Medications    Prior to Admission  medications   Medication Sig Start Date End Date Taking? Authorizing Provider  aspirin EC 325 MG tablet Take 1 tablet (325 mg total) by mouth daily. 01/06/18   Volanda Napoleon, MD  bimatoprost (LUMIGAN) 0.01 % SOLN Place 1 drop into both eyes at bedtime.  12/07/11   [provider]  Calcium Carbonate-Vit D-Min (CALTRATE 600+D PLUS PO) Take 1 tablet by mouth daily.    [provider]  carvedilol (COREG) 25 MG tablet Take 25 mg by mouth 2 (two) times daily with a meal.    [provider]  Cholecalciferol (VITAMIN D3) 1000 UNITS CAPS Take 1,000 Units by mouth daily.     [provider]  cloNIDine (CATAPRES) 0.1 MG tablet Take 0.1 mg by mouth 2 (two) times daily as needed (elevated blood pressure >190/80).  01/03/16   [provider]  cyanocobalamin ([employer] VITAMIN B12) 2000 MCG tablet Take 2,000 mcg by mouth at bedtime.     [provider]  famciclovir (FAMVIR) 250 MG tablet Take 1 tablet (250 mg total) by mouth daily. 01/27/18   Volanda Napoleon, MD  hydrochlorothiazide (HYDRODIURIL) 25 MG tablet Take 25 mg by mouth daily.    [provider]  losartan (COZAAR) 100 MG tablet Take 100 mg by mouth daily.    [provider]  Multiple Vitamin (MULITIVITAMIN WITH MINERALS) TABS Take 1 tablet by mouth daily.    [provider]  ondansetron (ZOFRAN) 8 MG tablet Take 1 tablet (8 mg total) by mouth every 8 (eight) hours as needed for nausea or vomiting. 06/02/18   Volanda Napoleon, MD  Polyethyl Glycol-Propyl Glycol (SYSTANE OP) Place 1 drop into both eyes daily as needed (dry eyes).     [provider]  potassium chloride SA (K-DUR,KLOR-CON) 20 MEQ tablet Take 20 mEq by mouth daily.    [provider]  pravastatin (PRAVACHOL) 20 MG tablet Take 20 mg by mouth at bedtime.    [provider]  prochlorperazine (COMPAZINE) 10 MG tablet Take 1 tablet (10 mg total) by mouth every 6 (six) hours as needed for nausea or  vomiting. 02/03/18   Volanda Napoleon, MD  tadalafil (ADCIRCA/CIALIS) 20 MG tablet Take 20 mg by mouth daily as needed for erectile dysfunction.    [provider]  Zoledronic Acid (ZOMETA) 4 MG/100ML IVPB Inject 4 mg into the vein every 4 (four) months. Administered by Dr. Jonette Eva at [facility] - last infusion mid July 2019    [provider]    Family History Family History  Problem Relation Age of Onset  . Heart attack Father   . Heart disease Father   . Throat cancer Mother   . Diabetes Brother   . Colon cancer Neg Hx     Social History Social History   Tobacco Use  . Smoking status: Former Smoker    Packs/day: 4.00    Years: 9.00    Pack years: 36.00    Types: Cigars, Cigarettes    Start date: 07/08/1979    Last attempt to quit: 07/07/1988    Years since quitting: 30.0  . Smokeless tobacco: Never Used  . Tobacco comment: quit 24 years ago  Substance Use Topics  . Alcohol use: Yes    Alcohol/week: 6.0 standard drinks    Types: 6 Glasses of wine per week    Comment: very seldom  . Drug use: No     Allergies   Patient has no known allergies.   Review of Systems Review of Systems  Constitutional: Negative for fever.  HENT: Negative for sore throat.   Eyes: Negative for visual disturbance.  Respiratory: Negative for shortness of breath.   Cardiovascular: Negative for chest pain.  Gastrointestinal: Negative for abdominal pain.  Genitourinary: Negative for dysuria.  Musculoskeletal: Negative for joint swelling.  Skin: Negative for rash.  Neurological: Negative for headaches.     Physical Exam Updated Vital Signs BP (!) 204/95   Pulse 63   Temp 97.9 F (36.6 C) (Oral)   Resp 10   Ht '5\' 11"'  (1.803 m)   Wt 97.1 kg   SpO2 99%   BMI 29.85 kg/m   Physical Exam Vitals signs and nursing note reviewed.  Constitutional:      Appearance: He is well-developed.  HENT:     Head: Normocephalic and atraumatic.  Eyes:      Conjunctiva/sclera: Conjunctivae normal.  Neck:     Musculoskeletal: Neck supple.  Cardiovascular:     Rate and Rhythm: Normal rate and regular rhythm.     Heart sounds: No murmur.  Pulmonary:     Effort: Pulmonary effort is normal. No respiratory distress.     Breath sounds: Normal breath sounds.  Abdominal:     Palpations: Abdomen is soft.     Tenderness: There is no abdominal tenderness.  Skin:    General: Skin is warm and dry.  Capillary Refill: Capillary refill takes less than 2 seconds.  Neurological:     General: No focal deficit present.     Mental Status: He is alert and oriented to person, place, and time.      ED Treatments / Results  Labs (all labs ordered are listed, but only abnormal results are displayed) Labs Reviewed  BASIC METABOLIC PANEL - Abnormal; Notable for the following components:      Result Value   BUN 24 (*)    All other components within normal limits  CBC WITH DIFFERENTIAL/PLATELET - Abnormal; Notable for the following components:   WBC 3.9 (*)    RBC 2.52 (*)    Hemoglobin 8.6 (*)    HCT 26.1 (*)    MCV 103.6 (*)    MCH 34.1 (*)    RDW 15.6 (*)    Platelets 93 (*)    nRBC 1.0 (*)    Lymphs Abs 0.5 (*)    Abs Immature Granulocytes 0.09 (*)    All other components within normal limits  URINALYSIS, ROUTINE W REFLEX MICROSCOPIC - Abnormal; Notable for the following components:   Color, Urine COLORLESS (*)    Hgb urine dipstick SMALL (*)    All other components within normal limits    EKG EKG Interpretation  Date/Time:  Tuesday July 05 2018 13:57:16 EST Ventricular Rate:  65 PR Interval:    QRS Duration: 99 QT Interval:  416 QTC Calculation: 433 R Axis:   20 Text Interpretation:  Sinus rhythm Prolonged PR interval Abnormal R-wave progression, early transition similar to prior 8/19 Confirmed by Aletta Edouard [phone]) on 07/05/2018 5:36:07 PM   Radiology No results found.  Procedures Procedures (including critical care  time)  Medications Ordered in ED Medications  cloNIDine (CATAPRES) tablet 0.1 mg (has no administration in time range)     Initial Impression / Assessment and Plan / ED Course  I have reviewed the triage vital signs and the nursing notes.  Pertinent labs & imaging results that were available during my care of the patient were reviewed by me and considered in my medical decision making (see chart for details).  Clinical Course as of Jul 05 1733  Tue Jul 05, 2018  1425 Patient here with asymptomatic hypertension.  I reviewed his med list and he does have as needed clonidine.  I have ordered a dose of that.   [MB]  1425 EKG shows normal sinus rhythm rate of 65 borderline prolonged PR poor R wave progression no acute ST-T changes.   [MB]  0786 I called Dr. Marin Olp from oncology and he said his blood pressures been like this for the last 4 years.  He said this is not related to the chemotherapy.  His blood pressure is a little bit down now and will discharge to have him follow-up with his primary care doctor for further management of his blood pressure.   [MB]    Clinical Course User Index [MB] Hayden Rasmussen, MD   Final Clinical Impressions(s) / ED Diagnoses   Final diagnoses:  Essential hypertension  Multiple myeloma not having achieved remission [facility])    ED Discharge Orders    None       Hayden Rasmussen, MD 07/05/18 1736

## 2018-07-05 NOTE — Discharge Instructions (Signed)
You were seen in the emergency department for 1 week of elevated blood pressures.  You had blood work and an EKG that did not show any obvious signs of injury from the elevated blood pressure.  You may need to have your blood pressure medication adjusted and should contact your primary care doctor for follow-up.

## 2018-07-05 NOTE — ED Triage Notes (Signed)
Pt reports elevated BP since last week, went to this PCP last Thursday and was told to keep a record of his BP at home.  He presents today with elevated BP at 210/100 at 1237, 196/80 at 1158.  He has taken his HTN meds this am.  No dizziness, cp, or SOB.  He is A&Ox 4, in NAD.

## 2018-07-06 ENCOUNTER — Other Ambulatory Visit: Payer: Self-pay | Admitting: *Deleted

## 2018-07-06 DIAGNOSIS — C9002 Multiple myeloma in relapse: Secondary | ICD-10-CM

## 2018-07-07 ENCOUNTER — Inpatient Hospital Stay: Payer: Medicare Other

## 2018-07-07 ENCOUNTER — Other Ambulatory Visit: Payer: Self-pay | Admitting: *Deleted

## 2018-07-07 VITALS — BP 180/79

## 2018-07-07 DIAGNOSIS — I10 Essential (primary) hypertension: Secondary | ICD-10-CM

## 2018-07-07 DIAGNOSIS — Z7982 Long term (current) use of aspirin: Secondary | ICD-10-CM | POA: Diagnosis not present

## 2018-07-07 DIAGNOSIS — C9002 Multiple myeloma in relapse: Secondary | ICD-10-CM

## 2018-07-07 DIAGNOSIS — Z5112 Encounter for antineoplastic immunotherapy: Secondary | ICD-10-CM | POA: Diagnosis not present

## 2018-07-07 DIAGNOSIS — Z79899 Other long term (current) drug therapy: Secondary | ICD-10-CM | POA: Diagnosis not present

## 2018-07-07 LAB — CBC WITH DIFFERENTIAL (CANCER CENTER ONLY)
Abs Immature Granulocytes: 0.03 10*3/uL (ref 0.00–0.07)
Basophils Absolute: 0 10*3/uL (ref 0.0–0.1)
Basophils Relative: 1 %
Eosinophils Absolute: 0 10*3/uL (ref 0.0–0.5)
Eosinophils Relative: 1 %
HCT: 24.8 % — ABNORMAL LOW (ref 39.0–52.0)
Hemoglobin: 8.4 g/dL — ABNORMAL LOW (ref 13.0–17.0)
Immature Granulocytes: 1 %
Lymphocytes Relative: 20 %
Lymphs Abs: 0.6 10*3/uL — ABNORMAL LOW (ref 0.7–4.0)
MCH: 34.4 pg — ABNORMAL HIGH (ref 26.0–34.0)
MCHC: 33.9 g/dL (ref 30.0–36.0)
MCV: 101.6 fL — ABNORMAL HIGH (ref 80.0–100.0)
Monocytes Absolute: 0.6 10*3/uL (ref 0.1–1.0)
Monocytes Relative: 22 %
Neutro Abs: 1.6 10*3/uL — ABNORMAL LOW (ref 1.7–7.7)
Neutrophils Relative %: 55 %
Platelet Count: 101 10*3/uL — ABNORMAL LOW (ref 150–400)
RBC: 2.44 MIL/uL — ABNORMAL LOW (ref 4.22–5.81)
RDW: 15.5 % (ref 11.5–15.5)
WBC Count: 2.9 10*3/uL — ABNORMAL LOW (ref 4.0–10.5)
nRBC: 0 % (ref 0.0–0.2)

## 2018-07-07 LAB — CMP (CANCER CENTER ONLY)
ALT: 11 U/L (ref 0–44)
AST: 16 U/L (ref 15–41)
Albumin: 3.9 g/dL (ref 3.5–5.0)
Alkaline Phosphatase: 61 U/L (ref 38–126)
Anion gap: 5 (ref 5–15)
BUN: 23 mg/dL (ref 8–23)
CO2: 28 mmol/L (ref 22–32)
Calcium: 10 mg/dL (ref 8.9–10.3)
Chloride: 101 mmol/L (ref 98–111)
Creatinine: 1.06 mg/dL (ref 0.61–1.24)
GFR, Est AFR Am: >60 mL/min (ref >60)
GFR, Estimated: >60 mL/min (ref >60)
Glucose, Bld: 102 mg/dL — ABNORMAL HIGH (ref 70–99)
Potassium: 3.7 mmol/L (ref 3.5–5.1)
Sodium: 134 mmol/L — ABNORMAL LOW (ref 135–145)
Total Bilirubin: 0.8 mg/dL (ref 0.3–1.2)
Total Protein: 6.3 g/dL — ABNORMAL LOW (ref 6.5–8.1)

## 2018-07-07 MED ORDER — SODIUM CHLORIDE 0.9 % IV SOLN
Freq: Once | INTRAVENOUS | Status: DC
  Filled 2018-07-07: qty 250

## 2018-07-07 MED ORDER — PROCHLORPERAZINE MALEATE 10 MG PO TABS
ORAL_TABLET | ORAL | Status: AC
  Filled 2018-07-07: qty 1

## 2018-07-07 MED ORDER — SODIUM CHLORIDE 0.9 % IV SOLN
Freq: Once | INTRAVENOUS | Status: AC
  Administered 2018-07-07: 12:00:00 via INTRAVENOUS
  Filled 2018-07-07: qty 250

## 2018-07-07 MED ORDER — HEPARIN SOD (PORK) LOCK FLUSH 100 UNIT/ML IV SOLN
500.0000 [IU] | Freq: Once | INTRAVENOUS | Status: AC | PRN
  Administered 2018-07-07: 500 [IU]
  Filled 2018-07-07: qty 5

## 2018-07-07 MED ORDER — SODIUM CHLORIDE 0.9% FLUSH
10.0000 mL | INTRAVENOUS | Status: DC | PRN
  Administered 2018-07-07: 10 mL
  Filled 2018-07-07: qty 10

## 2018-07-07 MED ORDER — PROCHLORPERAZINE MALEATE 10 MG PO TABS
10.0000 mg | ORAL_TABLET | Freq: Once | ORAL | Status: AC
  Administered 2018-07-07: 10 mg via ORAL

## 2018-07-07 MED ORDER — DEXTROSE 5 % IV SOLN
68.0000 mg/m2 | Freq: Once | INTRAVENOUS | Status: AC
  Administered 2018-07-07: 150 mg via INTRAVENOUS
  Filled 2018-07-07: qty 60

## 2018-07-07 MED ORDER — SODIUM CHLORIDE 0.9 % IV SOLN
20.0000 mg | Freq: Once | INTRAVENOUS | Status: AC
  Administered 2018-07-07: 20 mg via INTRAVENOUS
  Filled 2018-07-07: qty 2

## 2018-07-07 MED ORDER — SODIUM CHLORIDE 0.9 % IV SOLN
300.0000 mg/m2 | Freq: Once | INTRAVENOUS | Status: AC
  Administered 2018-07-07: 660 mg via INTRAVENOUS
  Filled 2018-07-07: qty 33

## 2018-07-07 NOTE — Progress Notes (Signed)
Ok to treat with BP today per MD Marin Olp

## 2018-07-07 NOTE — Patient Instructions (Signed)
[facility] Discharge Instructions for Patients Receiving Chemotherapy  Today you received the following chemotherapy agents:  Kyprolis and Cytoxan.  To help prevent nausea and vomiting after your treatment, we encourage you to take your nausea medication as directed.   If you develop nausea and vomiting that is not controlled by your nausea medication, call the clinic.   BELOW ARE SYMPTOMS THAT SHOULD BE REPORTED IMMEDIATELY:  *FEVER GREATER THAN 100.5 F  *CHILLS WITH OR WITHOUT FEVER  NAUSEA AND VOMITING THAT IS NOT CONTROLLED WITH YOUR NAUSEA MEDICATION  *UNUSUAL SHORTNESS OF BREATH  *UNUSUAL BRUISING OR BLEEDING  TENDERNESS IN MOUTH AND THROAT WITH OR WITHOUT PRESENCE OF ULCERS  *URINARY PROBLEMS  *BOWEL PROBLEMS  UNUSUAL RASH Items with * indicate a potential emergency and should be followed up as soon as possible.  Feel free to call the clinic you have any questions or concerns. The clinic phone number is [phone].  Please show the CHEMO ALERT CARD at check-in to the Emergency Department and triage nurse.   

## 2018-07-13 ENCOUNTER — Other Ambulatory Visit: Payer: Self-pay | Admitting: *Deleted

## 2018-07-13 DIAGNOSIS — C9000 Multiple myeloma not having achieved remission: Secondary | ICD-10-CM

## 2018-07-13 DIAGNOSIS — C9002 Multiple myeloma in relapse: Secondary | ICD-10-CM

## 2018-07-14 ENCOUNTER — Inpatient Hospital Stay: Payer: Medicare Other

## 2018-07-14 ENCOUNTER — Emergency Department (HOSPITAL_BASED_OUTPATIENT_CLINIC_OR_DEPARTMENT_OTHER)
Admission: EM | Admit: 2018-07-14 | Discharge: 2018-07-14 | Disposition: A | Discharge status: Home / Self Care | Payer: Medicare Other | Attending: Emergency Medicine | Admitting: Emergency Medicine

## 2018-07-14 ENCOUNTER — Encounter (HOSPITAL_BASED_OUTPATIENT_CLINIC_OR_DEPARTMENT_OTHER): Payer: Self-pay | Admitting: *Deleted

## 2018-07-14 ENCOUNTER — Other Ambulatory Visit: Payer: Self-pay

## 2018-07-14 ENCOUNTER — Other Ambulatory Visit: Payer: Self-pay | Admitting: *Deleted

## 2018-07-14 DIAGNOSIS — Z8579 Personal history of other malignant neoplasms of lymphoid, hematopoietic and related tissues: Secondary | ICD-10-CM | POA: Insufficient documentation

## 2018-07-14 DIAGNOSIS — D649 Anemia, unspecified: Secondary | ICD-10-CM

## 2018-07-14 DIAGNOSIS — Z79899 Other long term (current) drug therapy: Secondary | ICD-10-CM | POA: Insufficient documentation

## 2018-07-14 DIAGNOSIS — Z87891 Personal history of nicotine dependence: Secondary | ICD-10-CM | POA: Insufficient documentation

## 2018-07-14 DIAGNOSIS — C9000 Multiple myeloma not having achieved remission: Secondary | ICD-10-CM

## 2018-07-14 DIAGNOSIS — I10 Essential (primary) hypertension: Secondary | ICD-10-CM | POA: Diagnosis present

## 2018-07-14 DIAGNOSIS — Z7982 Long term (current) use of aspirin: Secondary | ICD-10-CM | POA: Insufficient documentation

## 2018-07-14 DIAGNOSIS — I159 Secondary hypertension, unspecified: Secondary | ICD-10-CM | POA: Diagnosis not present

## 2018-07-14 DIAGNOSIS — C9002 Multiple myeloma in relapse: Secondary | ICD-10-CM

## 2018-07-14 LAB — CBC WITH DIFFERENTIAL (CANCER CENTER ONLY)
Abs Immature Granulocytes: 0.04 10*3/uL (ref 0.00–0.07)
Basophils Absolute: 0 10*3/uL (ref 0.0–0.1)
Basophils Relative: 0 %
Eosinophils Absolute: 0 10*3/uL (ref 0.0–0.5)
Eosinophils Relative: 2 %
HCT: 23.4 % — ABNORMAL LOW (ref 39.0–52.0)
Hemoglobin: 7.8 g/dL — ABNORMAL LOW (ref 13.0–17.0)
Immature Granulocytes: 2 %
Lymphocytes Relative: 19 %
Lymphs Abs: 0.5 10*3/uL — ABNORMAL LOW (ref 0.7–4.0)
MCH: 35 pg — ABNORMAL HIGH (ref 26.0–34.0)
MCHC: 33.3 g/dL (ref 30.0–36.0)
MCV: 104.9 fL — ABNORMAL HIGH (ref 80.0–100.0)
Monocytes Absolute: 0.6 10*3/uL (ref 0.1–1.0)
Monocytes Relative: 21 %
Neutro Abs: 1.6 10*3/uL — ABNORMAL LOW (ref 1.7–7.7)
Neutrophils Relative %: 56 %
Platelet Count: 102 10*3/uL — ABNORMAL LOW (ref 150–400)
RBC: 2.23 MIL/uL — ABNORMAL LOW (ref 4.22–5.81)
RDW: 15.5 % (ref 11.5–15.5)
WBC Count: 2.7 10*3/uL — ABNORMAL LOW (ref 4.0–10.5)
nRBC: 0 % (ref 0.0–0.2)

## 2018-07-14 LAB — CMP (CANCER CENTER ONLY)
ALT: 17 U/L (ref 0–44)
AST: 15 U/L (ref 15–41)
Albumin: 3.7 g/dL (ref 3.5–5.0)
Alkaline Phosphatase: 58 U/L (ref 38–126)
Anion gap: 7 (ref 5–15)
BUN: 21 mg/dL (ref 8–23)
CO2: 25 mmol/L (ref 22–32)
Calcium: 9.2 mg/dL (ref 8.9–10.3)
Chloride: 104 mmol/L (ref 98–111)
Creatinine: 1.01 mg/dL (ref 0.61–1.24)
GFR, Est AFR Am: >60 mL/min (ref >60)
GFR, Estimated: >60 mL/min (ref >60)
Glucose, Bld: 121 mg/dL — ABNORMAL HIGH (ref 70–99)
Potassium: 3.9 mmol/L (ref 3.5–5.1)
Sodium: 136 mmol/L (ref 135–145)
Total Bilirubin: 0.8 mg/dL (ref 0.3–1.2)
Total Protein: 5.8 g/dL — ABNORMAL LOW (ref 6.5–8.1)

## 2018-07-14 MED ORDER — LIDOCAINE-PRILOCAINE 2.5-2.5 % EX CREA
TOPICAL_CREAM | CUTANEOUS | Status: AC
  Filled 2018-07-14: qty 30

## 2018-07-14 MED ORDER — DEXTROSE 5 % IV SOLN
68.0000 mg/m2 | Freq: Once | INTRAVENOUS | Status: AC
  Administered 2018-07-14: 150 mg via INTRAVENOUS
  Filled 2018-07-14: qty 60

## 2018-07-14 MED ORDER — HYDROCHLOROTHIAZIDE 25 MG PO TABS
25.0000 mg | ORAL_TABLET | Freq: Once | ORAL | Status: AC
  Administered 2018-07-14: 25 mg via ORAL
  Filled 2018-07-14: qty 1

## 2018-07-14 MED ORDER — PROCHLORPERAZINE MALEATE 10 MG PO TABS
ORAL_TABLET | ORAL | Status: AC
  Filled 2018-07-14: qty 1

## 2018-07-14 MED ORDER — SODIUM CHLORIDE 0.9 % IV SOLN
300.0000 mg/m2 | Freq: Once | INTRAVENOUS | Status: AC
  Administered 2018-07-14: 660 mg via INTRAVENOUS
  Filled 2018-07-14: qty 33

## 2018-07-14 MED ORDER — HYDROCHLOROTHIAZIDE 25 MG PO TABS: 25.0000 mg | ORAL_TABLET | Freq: Two times a day (BID) | ORAL | 0 refills | Status: DC

## 2018-07-14 MED ORDER — SODIUM CHLORIDE 0.9 % IV SOLN
Freq: Once | INTRAVENOUS | Status: AC
  Administered 2018-07-14: 13:00:00 via INTRAVENOUS
  Filled 2018-07-14: qty 250

## 2018-07-14 MED ORDER — SODIUM CHLORIDE 0.9 % IV SOLN
Freq: Once | INTRAVENOUS | Status: AC
  Administered 2018-07-14: 12:00:00 via INTRAVENOUS
  Filled 2018-07-14: qty 250

## 2018-07-14 MED ORDER — SODIUM CHLORIDE 0.9% FLUSH
10.0000 mL | INTRAVENOUS | Status: DC | PRN
  Administered 2018-07-14: 10 mL
  Filled 2018-07-14: qty 10

## 2018-07-14 MED ORDER — HEPARIN SOD (PORK) LOCK FLUSH 100 UNIT/ML IV SOLN
500.0000 [IU] | Freq: Once | INTRAVENOUS | Status: AC | PRN
  Administered 2018-07-14: 500 [IU]
  Filled 2018-07-14: qty 5

## 2018-07-14 MED ORDER — SODIUM CHLORIDE 0.9 % IV SOLN
20.0000 mg | Freq: Once | INTRAVENOUS | Status: AC
  Administered 2018-07-14: 20 mg via INTRAVENOUS
  Filled 2018-07-14: qty 2

## 2018-07-14 MED ORDER — CLONIDINE HCL 0.1 MG PO TABS
0.1000 mg | ORAL_TABLET | Freq: Once | ORAL | Status: AC
  Administered 2018-07-14: 0.1 mg via ORAL

## 2018-07-14 MED ORDER — CLONIDINE HCL 0.1 MG PO TABS
ORAL_TABLET | ORAL | Status: AC
  Filled 2018-07-14: qty 1

## 2018-07-14 MED ORDER — PROCHLORPERAZINE MALEATE 10 MG PO TABS
10.0000 mg | ORAL_TABLET | Freq: Once | ORAL | Status: AC
  Administered 2018-07-14: 10 mg via ORAL

## 2018-07-14 MED ORDER — CLONIDINE HCL 0.1 MG PO TABS
0.1000 mg | ORAL_TABLET | Freq: Once | ORAL | Status: AC
  Administered 2018-07-14: 0.1 mg via ORAL
  Filled 2018-07-14: qty 1

## 2018-07-14 NOTE — ED Notes (Signed)
ED Provider at bedside. 

## 2018-07-14 NOTE — Patient Instructions (Signed)

## 2018-07-14 NOTE — Progress Notes (Signed)
07/14/18  Proceed with treatment despite elevated blood pressure.  Patient is under the care of cardiologist and received clonidine today.    V.O. Dr Allen Kell RN/Suhaib Guzzo Ronnald Ramp, PharmD

## 2018-07-14 NOTE — Patient Instructions (Signed)
Carfilzomib injection What is this medicine? CARFILZOMIB (kar FILZ oh mib) targets a specific protein within cancer cells and stops the cancer cells from growing. It is used to treat multiple myeloma. This medicine may be used for other purposes; ask your health care provider or pharmacist if you have questions. COMMON BRAND NAME(S): KYPROLIS What should I tell my health care provider before I take this medicine? They need to know if you have any of these conditions: -heart disease -history of blood clots -irregular heartbeat -kidney disease -liver disease -lung or breathing disease -an unusual or allergic reaction to carfilzomib, or other medicines, foods, dyes, or preservatives -pregnant or trying to get pregnant -breast-feeding How should I use this medicine? This medicine is for injection or infusion into a vein. It is given by a health care professional in a hospital or clinic setting. Talk to your pediatrician regarding the use of this medicine in children. Special care may be needed. Overdosage: If you think you have taken too much of this medicine contact a poison control center or emergency room at once. NOTE: This medicine is only for you. Do not share this medicine with others. What if I miss a dose? It is important not to miss your dose. Call your doctor or health care professional if you are unable to keep an appointment. What may interact with this medicine? Interactions are not expected. Give your health care provider a list of all the medicines, herbs, non-prescription drugs, or dietary supplements you use. Also tell them if you smoke, drink alcohol, or use illegal drugs. Some items may interact with your medicine. This list may not describe all possible interactions. Give your health care provider a list of all the medicines, herbs, non-prescription drugs, or dietary supplements you use. Also tell them if you smoke, drink alcohol, or use illegal drugs. Some items may  interact with your medicine. What should I watch for while using this medicine? Your condition will be monitored carefully while you are receiving this medicine. Report any side effects. Continue your course of treatment even though you feel ill unless your doctor tells you to stop. You may need blood work done while you are taking this medicine. Do not become pregnant while taking this medicine or for at least 6 months after stopping it. Women should inform their doctor if they wish to become pregnant or think they might be pregnant. There is a potential for serious side effects to an unborn child. Men should not father a child while taking this medicine and for at least 3 months after stopping it. Talk to your health care professional or pharmacist for more information. Do not breast-feed an infant while taking this medicine or for 2 weeks after the last dose. Check with your doctor or health care professional if you get an attack of severe diarrhea, nausea and vomiting, or if you sweat a lot. The loss of too much body fluid can make it dangerous for you to take this medicine. You may get dizzy. Do not drive, use machinery, or do anything that needs mental alertness until you know how this medicine affects you. Do not stand or sit up quickly, especially if you are an older patient. This reduces the risk of dizzy or fainting spells. What side effects may I notice from receiving this medicine? Side effects that you should report to your doctor or health care professional as soon as possible: -allergic reactions like skin rash, itching or hives, swelling of the face, lips, or  tongue -confusion -dizziness -feeling faint or lightheaded -fever or chills -palpitations -seizures -signs and symptoms of bleeding such as bloody or black, tarry stools; red or dark-brown urine; spitting up blood or brown material that looks like coffee grounds; red spots on the skin; unusual bruising or bleeding including from  the eye, gums, or nose -signs and symptoms of a blood clot such as breathing problems; changes in vision; chest pain; severe, sudden headache; pain, swelling, warmth in the leg; trouble speaking; sudden numbness or weakness of the face, arm or leg -signs and symptoms of kidney injury like trouble passing urine or change in the amount of urine -signs and symptoms of liver injury like dark yellow or brown urine; general ill feeling or flu-like symptoms; light-colored stools; loss of appetite; nausea; right upper belly pain; unusually weak or tired; yellowing of the eyes or skin Side effects that usually do not require medical attention (report to your doctor or health care professional if they continue or are bothersome): -back pain -cough -diarrhea -headache -muscle cramps -vomiting This list may not describe all possible side effects. Call your doctor for medical advice about side effects. You may report side effects to FDA at 1-800-FDA-1088. Where should I keep my medicine? This drug is given in a hospital or clinic and will not be stored at home. NOTE: This sheet is a summary. It may not cover all possible information. If you have questions about this medicine, talk to your doctor, pharmacist, or health care provider.  2019 [employer] Standard (2017-02-24 14:07:13) Cyclophosphamide injection What is this medicine? CYCLOPHOSPHAMIDE (sye kloe FOSS fa mide) is a chemotherapy drug. It slows the growth of cancer cells. This medicine is used to treat many types of cancer like lymphoma, myeloma, leukemia, breast cancer, and ovarian cancer, to name a few. This medicine may be used for other purposes; ask your health care provider or pharmacist if you have questions. COMMON BRAND NAME(S): Cytoxan, Neosar What should I tell my health care provider before I take this medicine? They need to know if you have any of these conditions: -blood disorders -history of other  chemotherapy -infection -kidney disease -liver disease -recent or ongoing radiation therapy -tumors in the bone marrow -an unusual or allergic reaction to cyclophosphamide, other chemotherapy, other medicines, foods, dyes, or preservatives -pregnant or trying to get pregnant -breast-feeding How should I use this medicine? This drug is usually given as an injection into a vein or muscle or by infusion into a vein. It is administered in a hospital or clinic by a specially trained health care professional. Talk to your pediatrician regarding the use of this medicine in children. Special care may be needed. Overdosage: If you think you have taken too much of this medicine contact a poison control center or emergency room at once. NOTE: This medicine is only for you. Do not share this medicine with others. What if I miss a dose? It is important not to miss your dose. Call your doctor or health care professional if you are unable to keep an appointment. What may interact with this medicine? This medicine may interact with the following medications: -amiodarone -amphotericin B -azathioprine -certain antiviral medicines for HIV or AIDS such as protease inhibitors (e.g., indinavir, ritonavir) and zidovudine -certain blood pressure medications such as benazepril, captopril, enalapril, fosinopril, lisinopril, moexipril, monopril, perindopril, quinapril, ramipril, trandolapril -certain cancer medications such as anthracyclines (e.g., daunorubicin, doxorubicin), busulfan, cytarabine, paclitaxel, pentostatin, tamoxifen, trastuzumab -certain diuretics such as chlorothiazide, chlorthalidone, hydrochlorothiazide, indapamide, metolazone -certain medicines   that treat or prevent blood clots like warfarin -certain muscle relaxants such as succinylcholine -cyclosporine -etanercept -indomethacin -medicines to increase blood counts like filgrastim, pegfilgrastim, sargramostim -medicines used as general  anesthesia -metronidazole -natalizumab This list may not describe all possible interactions. Give your health care provider a list of all the medicines, herbs, non-prescription drugs, or dietary supplements you use. Also tell them if you smoke, drink alcohol, or use illegal drugs. Some items may interact with your medicine. What should I watch for while using this medicine? Visit your doctor for checks on your progress. This drug may make you feel generally unwell. This is not uncommon, as chemotherapy can affect healthy cells as well as cancer cells. Report any side effects. Continue your course of treatment even though you feel ill unless your doctor tells you to stop. Drink water or other fluids as directed. Urinate often, even at night. In some cases, you may be given additional medicines to help with side effects. Follow all directions for their use. Call your doctor or health care professional for advice if you get a fever, chills or sore throat, or other symptoms of a cold or flu. Do not treat yourself. This drug decreases your body's ability to fight infections. Try to avoid being around people who are sick. This medicine may increase your risk to bruise or bleed. Call your doctor or health care professional if you notice any unusual bleeding. Be careful brushing and flossing your teeth or using a toothpick because you may get an infection or bleed more easily. If you have any dental work done, tell your dentist you are receiving this medicine. You may get drowsy or dizzy. Do not drive, use machinery, or do anything that needs mental alertness until you know how this medicine affects you. Do not become pregnant while taking this medicine or for 1 year after stopping it. Women should inform their doctor if they wish to become pregnant or think they might be pregnant. Men should not father a child while taking this medicine and for 4 months after stopping it. There is a potential for serious side  effects to an unborn child. Talk to your health care professional or pharmacist for more information. Do not breast-feed an infant while taking this medicine. This medicine may interfere with the ability to have a child. This medicine has caused ovarian failure in some women. This medicine has caused reduced sperm counts in some men. You should talk with your doctor or health care professional if you are concerned about your fertility. If you are going to have surgery, tell your doctor or health care professional that you have taken this medicine. What side effects may I notice from receiving this medicine? Side effects that you should report to your doctor or health care professional as soon as possible: -allergic reactions like skin rash, itching or hives, swelling of the face, lips, or tongue -low blood counts - this medicine may decrease the number of white blood cells, red blood cells and platelets. You may be at increased risk for infections and bleeding. -signs of infection - fever or chills, cough, sore throat, pain or difficulty passing urine -signs of decreased platelets or bleeding - bruising, pinpoint red spots on the skin, black, tarry stools, blood in the urine -signs of decreased red blood cells - unusually weak or tired, fainting spells, lightheadedness -breathing problems -dark urine -dizziness -palpitations -swelling of the ankles, feet, hands -trouble passing urine or change in the amount of urine -weight gain -  yellowing of the eyes or skin Side effects that usually do not require medical attention (report to your doctor or health care professional if they continue or are bothersome): -changes in nail or skin color -hair loss -missed menstrual periods -mouth sores -nausea, vomiting This list may not describe all possible side effects. Call your doctor for medical advice about side effects. You may report side effects to FDA at 1-800-FDA-1088. Where should I keep my  medicine? This drug is given in a hospital or clinic and will not be stored at home. NOTE: This sheet is a summary. It may not cover all possible information. If you have questions about this medicine, talk to your doctor, pharmacist, or health care provider.  2019 [employer]/Gold Standard (2012-03-25 16:22:58)  

## 2018-07-14 NOTE — ED Provider Notes (Signed)
[facility] EMERGENCY DEPARTMENT Provider Note   CSN: [id number] Arrival date & time: 07/14/18  1609    History   Chief Complaint Chief Complaint  Patient presents with  . Hypertension    HPI Robert [name] is a 85 y.o. male.     The history is provided by the patient.  Hypertension  This is a chronic problem. The current episode started more than 1 week ago. The problem occurs daily. The problem has not changed since onset.Pertinent negatives include no chest pain, no abdominal pain, no headaches and no shortness of breath. Nothing aggravates the symptoms. Nothing relieves the symptoms. He has tried nothing for the symptoms. The treatment provided no relief.    Past Medical History:  Diagnosis Date  . Arthritis   . Goals of care, counseling/discussion 05/12/2018  . Hyperlipidemia   . Hypertension   . Multiple myeloma ([place])    2006    Patient Active Problem List   Diagnosis Date Noted  . Goals of care, counseling/discussion 05/12/2018  . Syncope 12/24/2017  . Multiple myeloma in relapse ([place]) 07/30/2016  . Lytic bone lesions on xray 03/17/2016  . Myeloma ([place]) 07/02/2011    Past Surgical History:  Procedure Laterality Date  . CATARACT EXTRACTION BILATERAL W/ ANTERIOR VITRECTOMY    . IR IMAGING GUIDED PORT INSERTION  05/30/2018  . LIMBAL STEM CELL TRANSPLANT          Home Medications    Prior to Admission medications   Medication Sig Start Date End Date Taking? Authorizing Provider  aspirin 81 MG tablet Take 81 mg by mouth daily.    [provider]  aspirin EC 325 MG tablet Take 1 tablet (325 mg total) by mouth daily. Patient not taking: Reported on 07/05/2018 01/06/18   Volanda Napoleon, MD  bimatoprost (LUMIGAN) 0.01 % SOLN Place 1 drop into both eyes at bedtime.  12/07/11   [provider]  Calcium Carbonate-Vit D-Min (CALTRATE 600+D PLUS PO) Take 1 tablet by mouth daily.    [provider]  carvedilol (COREG) 25 MG  tablet Take 25 mg by mouth 2 (two) times daily with a meal.    [provider]  Cholecalciferol (VITAMIN D3) 1000 UNITS CAPS Take 1,000 Units by mouth daily.     [provider]  cloNIDine (CATAPRES) 0.1 MG tablet Take 0.1 mg by mouth 2 (two) times daily as needed (elevated blood pressure >190/80).  01/03/16   [provider]  cyanocobalamin ([employer] VITAMIN B12) 2000 MCG tablet Take 2,000 mcg by mouth at bedtime.     [provider]  famciclovir (FAMVIR) 250 MG tablet Take 1 tablet (250 mg total) by mouth daily. Patient not taking: Reported on 07/05/2018 01/27/18   Volanda Napoleon, MD  hydrochlorothiazide (HYDRODIURIL) 25 MG tablet Take 1 tablet (25 mg total) by mouth 2 (two) times daily for 30 days. 07/14/18 08/13/18  Tashunda Vandezande, DO  losartan (COZAAR) 100 MG tablet Take 100 mg by mouth daily.    [provider]  Multiple Vitamin (MULITIVITAMIN WITH MINERALS) TABS Take 1 tablet by mouth daily.    [provider]  ondansetron (ZOFRAN) 8 MG tablet Take 1 tablet (8 mg total) by mouth every 8 (eight) hours as needed for nausea or vomiting. Patient not taking: Reported on 07/05/2018 06/02/18   Volanda Napoleon, MD  Polyethyl Glycol-Propyl Glycol (SYSTANE OP) Place 1 drop into both eyes daily as needed (dry eyes).     [provider]  potassium chloride SA (K-DUR,KLOR-CON) 20 MEQ tablet Take 20 mEq by mouth daily.    [provider]  pravastatin (PRAVACHOL) 20 MG tablet Take 20 mg by mouth at bedtime.    [provider]  prochlorperazine (COMPAZINE) 10 MG tablet Take 1 tablet (10 mg total) by mouth every 6 (six) hours as needed for nausea or vomiting. Patient not taking: Reported on 07/05/2018 02/03/18   Volanda Napoleon, MD  Zoledronic Acid (ZOMETA) 4 MG/100ML IVPB Inject 4 mg into the vein every 4 (four) months. Administered by Dr. Jonette Eva at [facility] - last infusion mid July 2019    [provider]     Family History Family History  Problem Relation Age of Onset  . Heart attack Father   . Heart disease Father   . Throat cancer Mother   . Diabetes Brother   . Colon cancer Neg Hx     Social History Social History   Tobacco Use  . Smoking status: Former Smoker    Packs/day: 4.00    Years: 9.00    Pack years: 36.00    Types: Cigars, Cigarettes    Start date: 07/08/1979    Last attempt to quit: 07/07/1988    Years since quitting: 30.0  . Smokeless tobacco: Never Used  . Tobacco comment: quit 24 years ago  Substance Use Topics  . Alcohol use: Yes    Alcohol/week: 6.0 standard drinks    Types: 6 Glasses of wine per week    Comment: very seldom  . Drug use: No     Allergies   Patient has no known allergies.   Review of Systems Review of Systems  Constitutional: Negative for chills and fever.  HENT: Negative for ear pain and sore throat.   Eyes: Negative for pain and visual disturbance.  Respiratory: Negative for cough and shortness of breath.   Cardiovascular: Negative for chest pain and palpitations.  Gastrointestinal: Negative for abdominal pain and vomiting.  Genitourinary: Negative for dysuria and hematuria.  Musculoskeletal: Negative for arthralgias and back pain.  Skin: Negative for color change and rash.  Neurological: Negative for seizures, syncope and headaches.  All other systems reviewed and are negative.    Physical Exam Updated Vital Signs  ED Triage Vitals  Enc Vitals Group     BP 07/14/18 1614 (!) 235/100     Pulse Rate 07/14/18 1614 67     Resp 07/14/18 1614 16     Temp 07/14/18 1614 (!) 97.5 F (36.4 C)     Temp Source 07/14/18 1614 Oral     SpO2 07/14/18 1614 99 %     Weight 07/14/18 1613 212 lb 1.3 oz (96.2 kg)     Height 07/14/18 1613 _0  (1.803 m)     Head Circumference --      Peak Flow --      Pain Score 07/14/18 1613 0     Pain Loc --      Pain Edu? --      Excl. in [place]? --     Physical Exam Vitals signs and nursing  note reviewed.  Constitutional:      Appearance: He is well-developed.  HENT:     Head: Normocephalic and atraumatic.     Nose: Nose normal.     Mouth/Throat:     Mouth: Mucous membranes are moist.  Eyes:     Extraocular Movements: Extraocular movements intact.     Conjunctiva/sclera: Conjunctivae normal.  Pupils: Pupils are equal, round, and reactive to light.  Neck:     Musculoskeletal: Normal range of motion and neck supple.  Cardiovascular:     Rate and Rhythm: Normal rate and regular rhythm.     Pulses: Normal pulses.     Heart sounds: Normal heart sounds. No murmur.  Pulmonary:     Effort: Pulmonary effort is normal. No respiratory distress.     Breath sounds: Normal breath sounds.  Abdominal:     General: There is no distension.     Palpations: Abdomen is soft.     Tenderness: There is no abdominal tenderness.  Skin:    General: Skin is warm and dry.  Neurological:     General: No focal deficit present.     Mental Status: He is alert and oriented to person, place, and time.     Cranial Nerves: No cranial nerve deficit.     Sensory: No sensory deficit.     Motor: No weakness.     Coordination: Coordination normal.     Gait: Gait normal.      ED Treatments / Results  Labs (all labs ordered are listed, but only abnormal results are displayed) Labs Reviewed - No data to display  EKG None  Radiology No results found.  Procedures Procedures (including critical care time)  Medications Ordered in ED Medications  cloNIDine (CATAPRES) tablet 0.1 mg (0.1 mg Oral Given 07/14/18 1627)  hydrochlorothiazide (HYDRODIURIL) tablet 25 mg (25 mg Oral Given 07/14/18 1638)     Initial Impression / Assessment and Plan / ED Course  I have reviewed the triage vital signs and the nursing notes.  Pertinent labs & imaging results that were available during my care of the patient were reviewed by me and considered in my medical decision making (see chart for details).         Robert [name] is an 85 year old male with history of hypertension, multiple myeloma who presents to the ED with hypertension from oncology clinic.  Patient with blood pressure 214/89 upon arrival.  Overall patient is asymptomatic.  Had similar emergency department visit 2 weeks ago for the same.  Had lab work done today that was overall unremarkable.  Kidney function at baseline.  Electrolytes within normal limits.  Patient has been compliant with his blood pressure medications.  States that his blood pressure at home usually runs in the 140s but he tends to have higher blood pressure when he is in the clinic and at hospitals.  Possible some element of whitecoat hypertension.  He is overall asymptomatic.  No signs of stroke on exam.  No chest pain.  Given a dose of clonidine and an additional dose of hydrochlorothiazide.  Blood pressure improved to 170/80.  Recommend that he continue to take clonidine twice a day as he has been doing recently.  Recommend adding additional dose of hydrochlorothiazide to 25 mg twice a day.  He has follow-up with cardiology this week to discuss his blood pressure.  Discharged from ED in good condition and given return precautions.  This chart was dictated using voice recognition software.  Despite best efforts to proofread,  errors can occur which can change the documentation meaning.   Final Clinical Impressions(s) / ED Diagnoses   Final diagnoses:  Secondary hypertension    ED Discharge Orders         Ordered    hydrochlorothiazide (HYDRODIURIL) 25 MG tablet  2 times daily     07/14/18 1645  Lennice Sites, DO 07/14/18 [phone]

## 2018-07-14 NOTE — ED Triage Notes (Signed)
He went for his routine Chemotherapy today and his BP was elevated. He was told to come to the ED. Asymptomatic.

## 2018-07-14 NOTE — Discharge Instructions (Addendum)
Take hydrochlorothiazide 25 mg twice a day as discussed.  Continue to take your clonidine twice a day as you have been doing.  Follow-up with your primary care doctor and cardiologist.

## 2018-07-14 NOTE — Progress Notes (Signed)
CBC and CMET reviewed by MD. Robert [name] to proceed with treatment. No blood transfusion today. Pt to come back in 1 week to check labs and possible transfusion. Orders entered.

## 2018-07-15 ENCOUNTER — Ambulatory Visit (INDEPENDENT_AMBULATORY_CARE_PROVIDER_SITE_OTHER): Payer: Medicare Other | Admitting: Cardiology

## 2018-07-15 ENCOUNTER — Encounter: Payer: Self-pay | Admitting: Cardiology

## 2018-07-15 VITALS — BP 220/90 | HR 73 | Ht 71.0 in | Wt 218.8 lb

## 2018-07-15 DIAGNOSIS — C9002 Multiple myeloma in relapse: Secondary | ICD-10-CM

## 2018-07-15 DIAGNOSIS — I10 Essential (primary) hypertension: Secondary | ICD-10-CM | POA: Diagnosis not present

## 2018-07-15 MED ORDER — CARVEDILOL 12.5 MG PO TABS: 12.5000 mg | ORAL_TABLET | Freq: Two times a day (BID) | ORAL | 1 refills | Status: DC

## 2018-07-15 MED ORDER — CLONIDINE HCL 0.1 MG PO TABS: 0.1000 mg | ORAL_TABLET | Freq: Every day | ORAL | 1 refills | Status: DC

## 2018-07-15 NOTE — Patient Instructions (Signed)
Medication Instructions:  Your physician has recommended you make the following change in your medication:   Change: Carvedilol to 12.5 mg twice daily  Change: Clonidine to 0.1 mg daily and then an extra tablet as needed for blood pressure greater than 170/100 If you need a refill on your cardiac medications before your next appointment, please call your pharmacy.   Lab work: None.  If you have labs (blood work) drawn today and your tests are completely normal, you will receive your results only by: Marland Kitchen MyChart Message (if you have MyChart) OR . A paper copy in the mail If you have any lab test that is abnormal or we need to change your treatment, we will call you to review the results.  Testing/Procedures: None.   Follow-Up: At [facility], you and your health needs are our priority.  As part of our continuing mission to provide you with exceptional heart care, we have created designated Provider Care Teams.  These Care Teams include your primary Cardiologist (physician) and Advanced Practice Providers (APPs -  Physician Assistants and Nurse Practitioners) who all work together to provide you with the care you need, when you need it. You will need a follow up appointment in 3 weeks.  Please call our office 2 months in advance to schedule this appointment.  You may see No primary care provider on file. or another member of our [employer] Provider Team in [place]: Shirlee More, MD . Jyl Heinz, MD  Any Other Special Instructions Will Be Listed Below (If Applicable).

## 2018-07-15 NOTE — Progress Notes (Signed)
Patient's blood pressure was 210/100 at 4:00 pm and 0.1 mg clonidine was give at 4:02 pm per Dr. Agustin Cree. At 4:22 pm blood pressure was rechecked and it was 192/100. Dr. Agustin Cree informed.

## 2018-07-15 NOTE — Progress Notes (Signed)
Cardiology Consultation:    Date:  07/15/2018   ID:  Robert [name], DOB Oct 13, 1933, MRN [id number]  PCP:  Leanna Battles, MD  Cardiologist:  Jenne Campus, MD   Referring MD: Volanda Napoleon, MD   Chief Complaint  Patient presents with  . Hypertension  . ER follow up  I was recently in the emergency room  History of Present Illness:    Robert [name] is a 85 y.o. male who is being seen today for the evaluation of essential hypertension at the request of Ennever, Rudell Cobb, MD.  He does have multiple myeloma apparently stem cell transplant was done years ago now he has reactivation of the problem his past medical history also significant for dyslipidemia hypertension as well as anemia.  Within last few weeks to being difficult time to manage his blood pressure he showed me blood pressure measurements from home it is always blood pressure systolic between 818 and 563 yesterday systolic blood pressure was more than 200 he ended up going to the emergency room.  He was given clonidine he was given hydrochlorothiazide his blood pressure went down and then he was dismissed home.  He is here in my office completely asymptomatic no chest pain tightness squeezing pressure burning chest.  His blood pressure is elevated.  220/110 on physical exam.  Extra clonidine has been giving to him already and we waiting for effect of it.  Any blurred vision he does not have headache there is no nausea no vomiting no pain. Never had a problem his heart.  Past Medical History:  Diagnosis Date  . Arthritis   . Goals of care, counseling/discussion 05/12/2018  . Hyperlipidemia   . Hypertension   . Multiple myeloma ([place])    2006    Past Surgical History:  Procedure Laterality Date  . CATARACT EXTRACTION BILATERAL W/ ANTERIOR VITRECTOMY    . IR IMAGING GUIDED PORT INSERTION  05/30/2018  . LIMBAL STEM CELL TRANSPLANT      Current Medications: Current Meds  Medication Sig  . aspirin 81 MG tablet Take 81  mg by mouth daily.  . bimatoprost (LUMIGAN) 0.01 % SOLN Place 1 drop into both eyes at bedtime.   . Calcium Carbonate-Vit D-Min (CALTRATE 600+D PLUS PO) Take 1 tablet by mouth daily.  . carvedilol (COREG) 25 MG tablet Take 25 mg by mouth 2 (two) times daily with a meal.  . Cholecalciferol (VITAMIN D3) 1000 UNITS CAPS Take 1,000 Units by mouth daily.   . cloNIDine (CATAPRES) 0.1 MG tablet Take 0.1 mg by mouth 2 (two) times daily as needed (elevated blood pressure >190/80).   . cyanocobalamin ([employer]S VITAMIN B12) 2000 MCG tablet Take 2,000 mcg by mouth at bedtime.   . famciclovir (FAMVIR) 250 MG tablet Take 1 tablet (250 mg total) by mouth daily.  . hydrochlorothiazide (HYDRODIURIL) 25 MG tablet Take 1 tablet (25 mg total) by mouth 2 (two) times daily for 30 days.  Marland Kitchen losartan (COZAAR) 100 MG tablet Take 100 mg by mouth daily.  . Multiple Vitamin (MULITIVITAMIN WITH MINERALS) TABS Take 1 tablet by mouth daily.  . ondansetron (ZOFRAN) 8 MG tablet Take 1 tablet (8 mg total) by mouth every 8 (eight) hours as needed for nausea or vomiting.  Vladimir Faster Glycol-Propyl Glycol (SYSTANE OP) Place 1 drop into both eyes daily as needed (dry eyes).   . potassium chloride SA (K-DUR,KLOR-CON) 20 MEQ tablet Take 20 mEq by mouth daily.  . pravastatin (PRAVACHOL) 20 MG tablet Take  20 mg by mouth at bedtime.  . prochlorperazine (COMPAZINE) 10 MG tablet Take 1 tablet (10 mg total) by mouth every 6 (six) hours as needed for nausea or vomiting.  . Zoledronic Acid (ZOMETA) 4 MG/100ML IVPB Inject 4 mg into the vein every 4 (four) months. Administered by Dr. Jonette Eva at [facility] - last infusion mid July 2019     Allergies:   Patient has no known allergies.   Social History   Socioeconomic History  . Marital status: Married    Spouse name: Not on file  . Number of children: Not on file  . Years of education: Not on file  . Highest education level: Not on file  Occupational History  . Not on file    Social Needs  . Financial resource strain: Not on file  . Food insecurity:    Worry: Not on file    Inability: Not on file  . Transportation needs:    Medical: Not on file    Non-medical: Not on file  Tobacco Use  . Smoking status: Former Smoker    Packs/day: 4.00    Years: 9.00    Pack years: 36.00    Types: Cigars, Cigarettes    Start date: 07/08/1979    Last attempt to quit: 07/07/1988    Years since quitting: 30.0  . Smokeless tobacco: Never Used  . Tobacco comment: quit 24 years ago  Substance and Sexual Activity  . Alcohol use: Yes    Alcohol/week: 6.0 standard drinks    Types: 6 Glasses of wine per week    Comment: very seldom  . Drug use: No  . Sexual activity: Not on file  Lifestyle  . Physical activity:    Days per week: Not on file    Minutes per session: Not on file  . Stress: Not on file  Relationships  . Social connections:    Talks on phone: Not on file    Gets together: Not on file    Attends religious service: Not on file    Active member of club or organization: Not on file    Attends meetings of clubs or organizations: Not on file    Relationship status: Not on file  Other Topics Concern  . Not on file  Social History Narrative  . Not on file     Family History: The patient's family history includes Diabetes in his brother; Heart attack in his father; Heart disease in his father; Throat cancer in his mother. There is no history of Colon cancer. ROS:   Please see the history of present illness.    All 14 point review of systems negative except as described per history of present illness.  EKGs/Labs/Other Studies Reviewed:    The following studies were reviewed today:   EKG:  EKG is  ordered today.  The ekg ordered today demonstrates normal sinus rhythm first-degree AV block normal QS complex duration morphology no ST segment changes  Echocardiogram from December 25, 2017:  Left ventricle: The cavity size was normal. Wall thickness was    increased in a pattern of moderate LVH. Systolic function was   normal. The estimated ejection fraction was in the range of 60%   to 65%. Left ventricular diastolic function parameters were   normal. - Aortic valve: Sclerosis without stenosis. There was mild   regurgitation. Valve area (VTI): 2.65 cm^2. Valve area (Vmax):   2.66 cm^2. Valve area (Vmean): 2.46 cm^2. - Mitral valve: Moderately calcified annulus.  Moderately thickened,   mildly calcified leaflets . - Left atrium: The atrium was mildly dilated. - Atrial septum: No defect or patent foramen ovale was identified. Recent Labs: 12/24/2017: Magnesium 2.0; TSH 1.614 07/14/2018: ALT 17; BUN 21; Creatinine 1.01; Hemoglobin 7.8; Platelet Count 102; Potassium 3.9; Sodium 136  Recent Lipid Panel No results found for: CHOL, TRIG, HDL, CHOLHDL, VLDL, LDLCALC, LDLDIRECT  Physical Exam:    VS:  BP (!) 220/90 (BP Location: Left Arm)   Pulse 73   Ht 5' 11" (1.803 m)   Wt 218 lb 12.8 oz (99.2 kg)   SpO2 95%   BMI 30.52 kg/m     Wt Readings from Last 3 Encounters:  07/15/18 218 lb 12.8 oz (99.2 kg)  07/14/18 212 lb 1.3 oz (96.2 kg)  07/07/18 212 lb 4 oz (96.3 kg)     GEN:  Well nourished, well developed in no acute distress HEENT: Normal NECK: No JVD; No carotid bruits LYMPHATICS: No lymphadenopathy CARDIAC: RRR, no murmurs, no rubs, no gallops RESPIRATORY:  Clear to auscultation without rales, wheezing or rhonchi  ABDOMEN: Soft, non-tender, non-distended MUSCULOSKELETAL:  No edema; No deformity  SKIN: Warm and dry NEUROLOGIC:  Alert and oriented x 3 PSYCHIATRIC:  Normal affect   ASSESSMENT:    1. Multiple myeloma in relapse ([place])   2. Essential hypertension    PLAN:    In order of problems listed above:  1. Essential hypertension not became very problematic for management.  I will ask him to spread his beta-blocker over 24 hours for now he takes 25 mg Coreg in the morning and nothing in the afternoon will go 12 point 5 in  the morning and 12 point 5 in the afternoon.  I will also ask him to increase doses of clonidine from 0.1 0.1 twice daily.  He will use extra 0.1 for blood pressure more than 160/100.  I advised him to keep checking his blood pressure let us know within the next few weeks what his blood pressures like.  In the matter-of-fact will contact him over the phone at the beginning of next week. 2. Myeloma that being managed excellently like always by our oncology team.   Medication Adjustments/Labs and Tests Ordered: Current medicines are reviewed at length with the patient today.  Concerns regarding medicines are outlined above.  No orders of the defined types were placed in this encounter.  No orders of the defined types were placed in this encounter.   Signed, Park Liter, MD, [facility]. 07/15/2018 4:17 PM    [facility]

## 2018-07-19 ENCOUNTER — Telehealth: Payer: Self-pay

## 2018-07-19 NOTE — Telephone Encounter (Signed)
Patient states "he feels much better today." Current BP 135/80 pulse 80, last night BP 153/82 with pulse of 69. Patient feels he is improving and medication changes are helping. No further questions or issues at this time.

## 2018-07-20 ENCOUNTER — Ambulatory Visit: Payer: TRICARE For Life (TFL) | Admitting: Cardiology

## 2018-07-28 ENCOUNTER — Other Ambulatory Visit: Payer: Self-pay | Admitting: *Deleted

## 2018-07-28 ENCOUNTER — Encounter: Payer: Self-pay | Admitting: Hematology & Oncology

## 2018-07-28 ENCOUNTER — Inpatient Hospital Stay: Payer: Medicare Other | Attending: Hematology & Oncology

## 2018-07-28 ENCOUNTER — Other Ambulatory Visit: Payer: Self-pay

## 2018-07-28 ENCOUNTER — Inpatient Hospital Stay (HOSPITAL_BASED_OUTPATIENT_CLINIC_OR_DEPARTMENT_OTHER): Payer: Medicare Other | Admitting: Hematology & Oncology

## 2018-07-28 ENCOUNTER — Inpatient Hospital Stay: Payer: Medicare Other

## 2018-07-28 VITALS — BP 197/96 | HR 69 | Temp 98.2°F | Resp 18 | Wt 213.0 lb

## 2018-07-28 DIAGNOSIS — M899 Disorder of bone, unspecified: Secondary | ICD-10-CM

## 2018-07-28 DIAGNOSIS — Z5111 Encounter for antineoplastic chemotherapy: Secondary | ICD-10-CM | POA: Insufficient documentation

## 2018-07-28 DIAGNOSIS — Z5112 Encounter for antineoplastic immunotherapy: Secondary | ICD-10-CM | POA: Diagnosis not present

## 2018-07-28 DIAGNOSIS — C9002 Multiple myeloma in relapse: Secondary | ICD-10-CM

## 2018-07-28 DIAGNOSIS — C9000 Multiple myeloma not having achieved remission: Secondary | ICD-10-CM

## 2018-07-28 DIAGNOSIS — D649 Anemia, unspecified: Secondary | ICD-10-CM | POA: Diagnosis not present

## 2018-07-28 DIAGNOSIS — I10 Essential (primary) hypertension: Secondary | ICD-10-CM | POA: Diagnosis not present

## 2018-07-28 DIAGNOSIS — Z9484 Stem cells transplant status: Secondary | ICD-10-CM | POA: Diagnosis not present

## 2018-07-28 DIAGNOSIS — Z79899 Other long term (current) drug therapy: Secondary | ICD-10-CM | POA: Diagnosis not present

## 2018-07-28 DIAGNOSIS — D508 Other iron deficiency anemias: Secondary | ICD-10-CM

## 2018-07-28 DIAGNOSIS — D638 Anemia in other chronic diseases classified elsewhere: Secondary | ICD-10-CM | POA: Insufficient documentation

## 2018-07-28 LAB — CMP (CANCER CENTER ONLY)
ALT: 14 U/L (ref 0–44)
AST: 17 U/L (ref 15–41)
Albumin: 3.9 g/dL (ref 3.5–5.0)
Alkaline Phosphatase: 59 U/L (ref 38–126)
Anion gap: 7 (ref 5–15)
BUN: 20 mg/dL (ref 8–23)
CO2: 27 mmol/L (ref 22–32)
Calcium: 9.7 mg/dL (ref 8.9–10.3)
Chloride: 102 mmol/L (ref 98–111)
Creatinine: 1 mg/dL (ref 0.61–1.24)
GFR, Est AFR Am: >60 mL/min (ref >60)
GFR, Estimated: >60 mL/min (ref >60)
Glucose, Bld: 114 mg/dL — ABNORMAL HIGH (ref 70–99)
Potassium: 3.6 mmol/L (ref 3.5–5.1)
Sodium: 136 mmol/L (ref 135–145)
Total Bilirubin: 0.9 mg/dL (ref 0.3–1.2)
Total Protein: 6.5 g/dL (ref 6.5–8.1)

## 2018-07-28 LAB — SAMPLE TO BLOOD BANK

## 2018-07-28 LAB — LACTATE DEHYDROGENASE: LDH: 305 U/L — ABNORMAL HIGH (ref 98–192)

## 2018-07-28 LAB — CBC WITH DIFFERENTIAL (CANCER CENTER ONLY)
Abs Immature Granulocytes: 0.02 10*3/uL (ref 0.00–0.07)
Basophils Absolute: 0 10*3/uL (ref 0.0–0.1)
Basophils Relative: 0 %
Eosinophils Absolute: 0.1 10*3/uL (ref 0.0–0.5)
Eosinophils Relative: 3 %
HCT: 21.3 % — ABNORMAL LOW (ref 39.0–52.0)
Hemoglobin: 7 g/dL — ABNORMAL LOW (ref 13.0–17.0)
Immature Granulocytes: 1 %
Lymphocytes Relative: 35 %
Lymphs Abs: 1.3 10*3/uL (ref 0.7–4.0)
MCH: 35.5 pg — ABNORMAL HIGH (ref 26.0–34.0)
MCHC: 32.9 g/dL (ref 30.0–36.0)
MCV: 108.1 fL — ABNORMAL HIGH (ref 80.0–100.0)
Monocytes Absolute: 0.5 10*3/uL (ref 0.1–1.0)
Monocytes Relative: 15 %
Neutro Abs: 1.7 10*3/uL (ref 1.7–7.7)
Neutrophils Relative %: 46 %
Platelet Count: 179 10*3/uL (ref 150–400)
RBC: 1.97 MIL/uL — ABNORMAL LOW (ref 4.22–5.81)
RDW: 15.9 % — ABNORMAL HIGH (ref 11.5–15.5)
WBC Count: 3.6 10*3/uL — ABNORMAL LOW (ref 4.0–10.5)
nRBC: 0 % (ref 0.0–0.2)

## 2018-07-28 LAB — RETICULOCYTES
Immature Retic Fract: 27.5 % — ABNORMAL HIGH (ref 2.3–15.9)
RBC.: 1.97 MIL/uL — ABNORMAL LOW (ref 4.22–5.81)
Retic Count, Absolute: 88.3 10*3/uL (ref 19.0–186.0)
Retic Ct Pct: 4.5 % — ABNORMAL HIGH (ref 0.4–3.1)

## 2018-07-28 MED ORDER — HEPARIN SOD (PORK) LOCK FLUSH 100 UNIT/ML IV SOLN
500.0000 [IU] | Freq: Once | INTRAVENOUS | Status: AC
  Administered 2018-07-28: 500 [IU] via INTRAVENOUS
  Filled 2018-07-28: qty 5

## 2018-07-28 MED ORDER — SODIUM CHLORIDE 0.9% FLUSH
10.0000 mL | INTRAVENOUS | Status: AC | PRN
  Administered 2018-07-28: 10 mL via INTRAVENOUS
  Filled 2018-07-28: qty 10

## 2018-07-28 MED ORDER — SODIUM CHLORIDE 0.9% FLUSH
10.0000 mL | Freq: Once | INTRAVENOUS | Status: AC | PRN
  Administered 2018-07-28: 10 mL
  Filled 2018-07-28: qty 10

## 2018-07-28 NOTE — Patient Instructions (Signed)

## 2018-07-28 NOTE — Addendum Note (Signed)
Addended by: Rico Ala on: 07/28/2018 01:13 PM   Modules accepted: Orders, SmartSet

## 2018-07-29 ENCOUNTER — Inpatient Hospital Stay: Payer: Medicare Other

## 2018-07-29 ENCOUNTER — Telehealth: Payer: Self-pay | Admitting: Hematology & Oncology

## 2018-07-29 DIAGNOSIS — C9002 Multiple myeloma in relapse: Secondary | ICD-10-CM

## 2018-07-29 DIAGNOSIS — Z9484 Stem cells transplant status: Secondary | ICD-10-CM | POA: Diagnosis not present

## 2018-07-29 DIAGNOSIS — D638 Anemia in other chronic diseases classified elsewhere: Secondary | ICD-10-CM | POA: Diagnosis not present

## 2018-07-29 DIAGNOSIS — Z5111 Encounter for antineoplastic chemotherapy: Secondary | ICD-10-CM | POA: Diagnosis not present

## 2018-07-29 DIAGNOSIS — Z5112 Encounter for antineoplastic immunotherapy: Secondary | ICD-10-CM | POA: Diagnosis not present

## 2018-07-29 DIAGNOSIS — I10 Essential (primary) hypertension: Secondary | ICD-10-CM | POA: Diagnosis not present

## 2018-07-29 LAB — KAPPA/LAMBDA LIGHT CHAINS
Kappa free light chain: 31.1 mg/L — ABNORMAL HIGH (ref 3.3–19.4)
Kappa, lambda light chain ratio: 2.14 — ABNORMAL HIGH (ref 0.26–1.65)
Lambda free light chains: 14.5 mg/L (ref 5.7–26.3)

## 2018-07-29 LAB — IGG, IGA, IGM
IgA: 25 mg/dL — ABNORMAL LOW (ref 61–437)
IgG (Immunoglobin G), Serum: 1171 mg/dL (ref 700–1600)
IgM (Immunoglobulin M), Srm: 45 mg/dL (ref 15–143)

## 2018-07-29 LAB — SAMPLE TO BLOOD BANK

## 2018-07-29 LAB — PREPARE RBC (CROSSMATCH)

## 2018-07-29 LAB — FERRITIN: Ferritin: 885 ng/mL — ABNORMAL HIGH (ref 24–336)

## 2018-07-29 LAB — IRON AND TIBC
Iron: 71 ug/dL (ref 45–182)
Saturation Ratios: 23 % (ref 17.9–39.5)
TIBC: 303 ug/dL (ref 250–450)
UIBC: 232 ug/dL

## 2018-07-29 NOTE — Telephone Encounter (Signed)
Appointments scheduled patient to get updated schedule at next visit per 3/5 los

## 2018-07-29 NOTE — Progress Notes (Signed)
Hematology and Oncology Follow Up Visit  Robert [name] [id number] 08-Jan-1934 85 y.o. 07/29/2018   Principle Diagnosis:  IgG Kappa Myeloma-Relapsed - Trisomy 11, 13q-  Past Therapy: RVD -S/p cycle #3 - revlimid on hold - d/c on 05/12/2018  Current Therapy:   KyCyD - started 06/02/2018 s/p cycle #2 Zometa 4 mg IV q 36months - next dose on 07/2018   Interim History:  Robert [name] is here today for follow-up and treatment.  As always, his blood pressure is still quite high.  This morning, his blood pressure is 196/90.  I had to believe that he has an element of myelodysplasia in addition to myeloma.  His red cell count just is having a very hard time with treatment despite what we are giving him.  Typically, the Kyprolis/Cytoxan is not nearly as myelosuppressive and we are just having a hard time with his bone marrow with his anemia.  I do not think I can treat him today because of his hemoglobin.  His hemoglobin is only 7.  His myeloma has responded.  More last saw him in February, his M spike was 1 g/dL.  His IgG level was 1388 mg/dL.  His kappa light chain was 3.4 mg/dL.  I am unsure if this continued high blood pressure is causing problems.  We cannot use ESA on him because of the high blood pressure.  We will had to set him up with a transfusion.  He is eating okay.  He still not exercising.  He has had no issues with bowels or bladder.  There is no leg swelling.  He has had no rashes.  There is no mouth sores.  He has had no cough or shortness of breath.  Overall, his performance status is ECOG 1.    Medications:  Allergies as of 07/28/2018   No Known Allergies     Medication List       Accurate as of July 28, 2018 11:59 PM. Always use your most recent med list.        aspirin 81 MG tablet Take 81 mg by mouth daily.   bimatoprost 0.01 % Soln Commonly known as:  LUMIGAN Place 1 drop into both eyes at bedtime.   CALTRATE 600+D PLUS PO Take 1 tablet by mouth daily.     carvedilol 12.5 MG tablet Commonly known as:  COREG Take 1 tablet (12.5 mg total) by mouth 2 (two) times daily with a meal.   cloNIDine 0.1 MG tablet Commonly known as:  CATAPRES Take 1 tablet (0.1 mg total) by mouth daily. Take an extra tablet as needed for blood pressure greater than 170/100   [employer] VITAMIN B12 2000 MCG tablet Generic drug:  cyanocobalamin Take 2,000 mcg by mouth at bedtime.   famciclovir 250 MG tablet Commonly known as:  FAMVIR Take 1 tablet (250 mg total) by mouth daily.   hydrochlorothiazide 25 MG tablet Commonly known as:  HYDRODIURIL Take 1 tablet (25 mg total) by mouth 2 (two) times daily for 30 days.   losartan 100 MG tablet Commonly known as:  COZAAR Take 100 mg by mouth daily.   multivitamin with minerals Tabs tablet Take 1 tablet by mouth daily.   ondansetron 8 MG tablet Commonly known as:  ZOFRAN Take 1 tablet (8 mg total) by mouth every 8 (eight) hours as needed for nausea or vomiting.   potassium chloride SA 20 MEQ tablet Commonly known as:  K-DUR,KLOR-CON Take 20 mEq by mouth daily.   pravastatin 20 MG  tablet Commonly known as:  PRAVACHOL Take 20 mg by mouth at bedtime.   prochlorperazine 10 MG tablet Commonly known as:  COMPAZINE Take 1 tablet (10 mg total) by mouth every 6 (six) hours as needed for nausea or vomiting.   SYSTANE OP Place 1 drop into both eyes daily as needed (dry eyes).   Vitamin D3 25 MCG (1000 UT) Caps Take 1,000 Units by mouth daily.   Zometa 4 MG/100ML IVPB Generic drug:  Zoledronic Acid Inject 4 mg into the vein every 4 (four) months. Administered by Dr. Jonette Eva at [facility] - last infusion mid July 2019       Allergies: No Known Allergies  Past Medical History, Surgical history, Social history, and Family History were reviewed and updated.  Review of Systems: Review of Systems  Constitutional: Negative.   HENT: Negative.   Eyes: Negative.   Respiratory: Negative.   Cardiovascular:  Negative.   Gastrointestinal: Negative.   Genitourinary: Negative.   Musculoskeletal: Negative.   Skin: Negative.   Neurological: Negative.   Endo/Heme/Allergies: Negative.   Psychiatric/Behavioral: Negative.      Physical Exam:  weight is 213 lb (96.6 kg). His oral temperature is 98.2 F (36.8 C). His blood pressure is 197/96 (abnormal) and his pulse is 69. His respiration is 18 and oxygen saturation is 100%.   Wt Readings from Last 3 Encounters:  07/28/18 213 lb (96.6 kg)  07/15/18 218 lb 12.8 oz (99.2 kg)  07/14/18 212 lb 1.3 oz (96.2 kg)    Physical Exam Vitals signs reviewed.  HENT:     Head: Normocephalic and atraumatic.  Eyes:     Pupils: Pupils are equal, round, and reactive to light.  Neck:     Musculoskeletal: Robert range of motion.  Cardiovascular:     Rate and Rhythm: Robert rate and regular rhythm.     Heart sounds: Robert heart sounds.  Pulmonary:     Effort: Pulmonary effort is Robert.     Breath sounds: Robert breath sounds.  Abdominal:     General: Bowel sounds are Robert.     Palpations: Abdomen is soft.  Musculoskeletal: Robert range of motion.        General: No tenderness or deformity.  Lymphadenopathy:     Cervical: No cervical adenopathy.  Skin:    General: Skin is warm and dry.     Findings: No erythema or rash.  Neurological:     Mental Status: He is alert and oriented to person, place, and time.  Psychiatric:        Behavior: Behavior Robert.        Thought Content: Thought content Robert.        Judgment: Judgment Robert.      Lab Results  Component Value Date   WBC 3.6 (L) 07/28/2018   HGB 7.0 (L) 07/28/2018   HCT 21.3 (L) 07/28/2018   MCV 108.1 (H) 07/28/2018   PLT 179 07/28/2018   Lab Results  Component Value Date   FERRITIN 601 (H) 06/30/2018   IRON 62 06/30/2018   TIBC 274 06/30/2018   UIBC 211 06/30/2018   IRONPCTSAT 23 06/30/2018   Lab Results  Component Value Date   RETICCTPCT 4.5 (H) 07/28/2018   RBC 1.97  (L) 07/28/2018   RBC 1.97 (L) 07/28/2018   Lab Results  Component Value Date   KPAFRELGTCHN 34.4 (H) 06/30/2018   LAMBDASER 6.5 06/30/2018   KAPLAMBRATIO 5.29 (H) 06/30/2018   Lab Results  Component Value Date  IGGSERUM 1,171 07/28/2018   IGA 25 (L) 07/28/2018   IGMSERUM 45 07/28/2018   Lab Results  Component Value Date   TOTALPROTELP 6.4 06/30/2018   ALBUMINELP 3.5 06/30/2018   A1GS 0.2 06/30/2018   A2GS 0.7 06/30/2018   BETS 0.7 06/30/2018   BETA2SER 0.3 01/03/2015   GAMS 1.3 06/30/2018   MSPIKE 1.0 (H) 06/30/2018   SPEI * 01/03/2015     Chemistry      Component Value Date/Time   NA 136 07/28/2018 1101   NA 137 04/08/2017 1141   NA 138 10/29/2015 1029   K 3.6 07/28/2018 1101   K 3.4 04/08/2017 1141   K 3.9 10/29/2015 1029   CL 102 07/28/2018 1101   CL 102 04/08/2017 1141   CO2 27 07/28/2018 1101   CO2 30 04/08/2017 1141   CO2 27 10/29/2015 1029   BUN 20 07/28/2018 1101   BUN 16 04/08/2017 1141   BUN 13.8 10/29/2015 1029   CREATININE 1.00 07/28/2018 1101   CREATININE 1.3 (H) 04/08/2017 1141   CREATININE 1.1 10/29/2015 1029      Component Value Date/Time   CALCIUM 9.7 07/28/2018 1101   CALCIUM 9.3 04/08/2017 1141   CALCIUM 9.3 10/29/2015 1029   ALKPHOS 59 07/28/2018 1101   ALKPHOS 66 04/08/2017 1141   ALKPHOS 52 10/29/2015 1029   AST 17 07/28/2018 1101   AST 20 10/29/2015 1029   ALT 14 07/28/2018 1101   ALT 21 04/08/2017 1141   ALT 14 10/29/2015 1029   BILITOT 0.9 07/28/2018 1101   BILITOT 0.75 10/29/2015 1029       Impression and Plan: Robert [name] is a very pleasant 85 yo African American gentleman with relapsed IgG kappa  myeloma.   he had a stem cell transplant back in 2006.  Again, I have to believe that he has an element of myelodysplasia along with the myeloma.  We may have to resort to monoclonal antibody therapy with him.  This may be the best way for [place] to try to minimize his anemia and his transfusion support.  We will go ahead and hold  his chemotherapy today.  I will have him come back in a week for treatment.  Again treatment is working and I do not want to give up on this too quickly.  We will have to set him up with a transfusion.  He will get 2 units of packed red blood cells.  I spent about 40 minutes with Robert [name] today.  All time spent face-to-face.  I counseled him.  I had to arrange for transfusions.  I did make a change in his treatment protocol.  I answered all of his questions.  I reviewed all of his lab work.  I will plan to see him back in 3 weeks.  I will give him a break in his treatment just so that we can get his hemoglobin better and maybe his blood pressure will improve.     Volanda Napoleon, MD 3/6/20207:00 AM

## 2018-07-29 NOTE — Patient Instructions (Signed)

## 2018-08-01 ENCOUNTER — Inpatient Hospital Stay: Payer: Medicare Other

## 2018-08-01 ENCOUNTER — Other Ambulatory Visit: Payer: Self-pay | Admitting: Hematology & Oncology

## 2018-08-01 DIAGNOSIS — D649 Anemia, unspecified: Secondary | ICD-10-CM

## 2018-08-01 DIAGNOSIS — C9002 Multiple myeloma in relapse: Secondary | ICD-10-CM

## 2018-08-01 DIAGNOSIS — Z9484 Stem cells transplant status: Secondary | ICD-10-CM | POA: Diagnosis not present

## 2018-08-01 DIAGNOSIS — Z5111 Encounter for antineoplastic chemotherapy: Secondary | ICD-10-CM | POA: Diagnosis not present

## 2018-08-01 DIAGNOSIS — I10 Essential (primary) hypertension: Secondary | ICD-10-CM | POA: Diagnosis not present

## 2018-08-01 DIAGNOSIS — Z5112 Encounter for antineoplastic immunotherapy: Secondary | ICD-10-CM | POA: Diagnosis not present

## 2018-08-01 DIAGNOSIS — D638 Anemia in other chronic diseases classified elsewhere: Secondary | ICD-10-CM | POA: Diagnosis not present

## 2018-08-01 LAB — PROTEIN ELECTROPHORESIS, SERUM
A/G Ratio: 1.2 (ref 0.7–1.7)
Albumin ELP: 3.3 g/dL (ref 2.9–4.4)
Alpha-1-Globulin: 0.2 g/dL (ref 0.0–0.4)
Alpha-2-Globulin: 0.5 g/dL (ref 0.4–1.0)
Beta Globulin: 0.9 g/dL (ref 0.7–1.3)
Gamma Globulin: 1 g/dL (ref 0.4–1.8)
Globulin, Total: 2.7 g/dL (ref 2.2–3.9)
M-Spike, %: 0.7 g/dL — ABNORMAL HIGH
Total Protein ELP: 6 g/dL (ref 6.0–8.5)

## 2018-08-01 MED ORDER — SODIUM CHLORIDE 0.9% IV SOLUTION
250.0000 mL | Freq: Once | INTRAVENOUS | Status: AC
  Administered 2018-08-01: 250 mL via INTRAVENOUS
  Filled 2018-08-01: qty 250

## 2018-08-01 MED ORDER — ACETAMINOPHEN 325 MG PO TABS
ORAL_TABLET | ORAL | Status: AC
  Filled 2018-08-01: qty 2

## 2018-08-01 MED ORDER — ACETAMINOPHEN 325 MG PO TABS
650.0000 mg | ORAL_TABLET | Freq: Once | ORAL | Status: AC
  Administered 2018-08-01: 650 mg via ORAL

## 2018-08-01 MED ORDER — SODIUM CHLORIDE 0.9% FLUSH
10.0000 mL | INTRAVENOUS | Status: AC | PRN
  Administered 2018-08-01: 10 mL
  Filled 2018-08-01: qty 10

## 2018-08-01 MED ORDER — SODIUM CHLORIDE 0.9% IV SOLUTION
250.0000 mL | Freq: Once | INTRAVENOUS | Status: AC
  Filled 2018-08-01: qty 250

## 2018-08-01 MED ORDER — HEPARIN SOD (PORK) LOCK FLUSH 100 UNIT/ML IV SOLN
500.0000 [IU] | Freq: Every day | INTRAVENOUS | Status: AC | PRN
  Administered 2018-08-01: 500 [IU]
  Filled 2018-08-01: qty 5

## 2018-08-01 MED ORDER — DIPHENHYDRAMINE HCL 25 MG PO CAPS
25.0000 mg | ORAL_CAPSULE | Freq: Once | ORAL | Status: AC
  Administered 2018-08-01: 25 mg via ORAL

## 2018-08-01 MED ORDER — DIPHENHYDRAMINE HCL 25 MG PO CAPS
ORAL_CAPSULE | ORAL | Status: AC
  Filled 2018-08-01: qty 1

## 2018-08-01 NOTE — Patient Instructions (Signed)

## 2018-08-02 LAB — TYPE AND SCREEN
ABO/RH(D): AB POS
Antibody Screen: NEGATIVE
Unit division: 0
Unit division: 0

## 2018-08-02 LAB — BPAM RBC
Blood Product Expiration Date: 202004022359
Blood Product Expiration Date: 202004042359
ISSUE DATE / TIME: 202003090903
ISSUE DATE / TIME: 202003090903
Unit Type and Rh: 6200
Unit Type and Rh: 6200

## 2018-08-03 ENCOUNTER — Other Ambulatory Visit: Payer: Self-pay | Admitting: *Deleted

## 2018-08-03 DIAGNOSIS — D649 Anemia, unspecified: Secondary | ICD-10-CM

## 2018-08-03 DIAGNOSIS — C9000 Multiple myeloma not having achieved remission: Secondary | ICD-10-CM

## 2018-08-04 ENCOUNTER — Other Ambulatory Visit: Payer: Self-pay

## 2018-08-04 ENCOUNTER — Inpatient Hospital Stay: Payer: Medicare Other

## 2018-08-04 VITALS — BP 166/74 | HR 66 | Temp 98.1°F | Resp 17

## 2018-08-04 DIAGNOSIS — I10 Essential (primary) hypertension: Secondary | ICD-10-CM | POA: Diagnosis not present

## 2018-08-04 DIAGNOSIS — Z9484 Stem cells transplant status: Secondary | ICD-10-CM | POA: Diagnosis not present

## 2018-08-04 DIAGNOSIS — Z5112 Encounter for antineoplastic immunotherapy: Secondary | ICD-10-CM | POA: Diagnosis not present

## 2018-08-04 DIAGNOSIS — C9002 Multiple myeloma in relapse: Secondary | ICD-10-CM | POA: Diagnosis not present

## 2018-08-04 DIAGNOSIS — D649 Anemia, unspecified: Secondary | ICD-10-CM

## 2018-08-04 DIAGNOSIS — Z5111 Encounter for antineoplastic chemotherapy: Secondary | ICD-10-CM | POA: Diagnosis not present

## 2018-08-04 DIAGNOSIS — D638 Anemia in other chronic diseases classified elsewhere: Secondary | ICD-10-CM | POA: Diagnosis not present

## 2018-08-04 DIAGNOSIS — C9000 Multiple myeloma not having achieved remission: Secondary | ICD-10-CM

## 2018-08-04 LAB — CMP (CANCER CENTER ONLY)
ALT: 14 U/L (ref 0–44)
AST: 17 U/L (ref 15–41)
Albumin: 3.8 g/dL (ref 3.5–5.0)
Alkaline Phosphatase: 57 U/L (ref 38–126)
Anion gap: 6 (ref 5–15)
BUN: 19 mg/dL (ref 8–23)
CO2: 27 mmol/L (ref 22–32)
Calcium: 9.3 mg/dL (ref 8.9–10.3)
Chloride: 102 mmol/L (ref 98–111)
Creatinine: 0.92 mg/dL (ref 0.61–1.24)
GFR, Est AFR Am: >60 mL/min (ref >60)
GFR, Estimated: >60 mL/min (ref >60)
Glucose, Bld: 113 mg/dL — ABNORMAL HIGH (ref 70–99)
Potassium: 3.7 mmol/L (ref 3.5–5.1)
Sodium: 135 mmol/L (ref 135–145)
Total Bilirubin: 1.1 mg/dL (ref 0.3–1.2)
Total Protein: 6.6 g/dL (ref 6.5–8.1)

## 2018-08-04 LAB — CBC WITH DIFFERENTIAL (CANCER CENTER ONLY)
Abs Immature Granulocytes: 0.03 10*3/uL (ref 0.00–0.07)
Basophils Absolute: 0 10*3/uL (ref 0.0–0.1)
Basophils Relative: 0 %
Eosinophils Absolute: 0.1 10*3/uL (ref 0.0–0.5)
Eosinophils Relative: 1 %
HCT: 26.8 % — ABNORMAL LOW (ref 39.0–52.0)
Hemoglobin: 8.8 g/dL — ABNORMAL LOW (ref 13.0–17.0)
Immature Granulocytes: 1 %
Lymphocytes Relative: 32 %
Lymphs Abs: 1.7 10*3/uL (ref 0.7–4.0)
MCH: 34.1 pg — ABNORMAL HIGH (ref 26.0–34.0)
MCHC: 32.8 g/dL (ref 30.0–36.0)
MCV: 103.9 fL — ABNORMAL HIGH (ref 80.0–100.0)
Monocytes Absolute: 0.7 10*3/uL (ref 0.1–1.0)
Monocytes Relative: 13 %
Neutro Abs: 2.7 10*3/uL (ref 1.7–7.7)
Neutrophils Relative %: 53 %
Platelet Count: 176 10*3/uL (ref 150–400)
RBC: 2.58 MIL/uL — ABNORMAL LOW (ref 4.22–5.81)
RDW: 16 % — ABNORMAL HIGH (ref 11.5–15.5)
WBC Count: 5.2 10*3/uL (ref 4.0–10.5)
nRBC: 0 % (ref 0.0–0.2)

## 2018-08-04 MED ORDER — HEPARIN SOD (PORK) LOCK FLUSH 100 UNIT/ML IV SOLN
500.0000 [IU] | Freq: Once | INTRAVENOUS | Status: AC | PRN
  Administered 2018-08-04: 500 [IU]
  Filled 2018-08-04: qty 5

## 2018-08-04 MED ORDER — PROCHLORPERAZINE MALEATE 10 MG PO TABS
10.0000 mg | ORAL_TABLET | Freq: Once | ORAL | Status: AC
  Administered 2018-08-04: 10 mg via ORAL

## 2018-08-04 MED ORDER — DEXTROSE 5 % IV SOLN
68.0000 mg/m2 | Freq: Once | INTRAVENOUS | Status: AC
  Administered 2018-08-04: 150 mg via INTRAVENOUS
  Filled 2018-08-04: qty 60

## 2018-08-04 MED ORDER — PROCHLORPERAZINE MALEATE 10 MG PO TABS
ORAL_TABLET | ORAL | Status: AC
  Filled 2018-08-04: qty 1

## 2018-08-04 MED ORDER — SODIUM CHLORIDE 0.9% FLUSH
10.0000 mL | INTRAVENOUS | Status: DC | PRN
  Administered 2018-08-04: 10 mL
  Filled 2018-08-04: qty 10

## 2018-08-04 MED ORDER — SODIUM CHLORIDE 0.9 % IV SOLN
Freq: Once | INTRAVENOUS | Status: AC
  Administered 2018-08-04: 13:00:00 via INTRAVENOUS
  Filled 2018-08-04: qty 250

## 2018-08-04 MED ORDER — SODIUM CHLORIDE 0.9 % IV SOLN
20.0000 mg | Freq: Once | INTRAVENOUS | Status: AC
  Administered 2018-08-04: 20 mg via INTRAVENOUS
  Filled 2018-08-04: qty 2

## 2018-08-04 MED ORDER — SODIUM CHLORIDE 0.9 % IV SOLN
Freq: Once | INTRAVENOUS | Status: AC
  Filled 2018-08-04: qty 250

## 2018-08-04 MED ORDER — SODIUM CHLORIDE 0.9 % IV SOLN
300.0000 mg/m2 | Freq: Once | INTRAVENOUS | Status: AC
  Administered 2018-08-04: 660 mg via INTRAVENOUS
  Filled 2018-08-04: qty 33

## 2018-08-04 NOTE — Patient Instructions (Signed)
Carfilzomib injection What is this medicine? CARFILZOMIB (kar FILZ oh mib) targets a specific protein within cancer cells and stops the cancer cells from growing. It is used to treat multiple myeloma. This medicine may be used for other purposes; ask your health care provider or pharmacist if you have questions. COMMON BRAND NAME(S): KYPROLIS What should I tell my health care provider before I take this medicine? They need to know if you have any of these conditions: -heart disease -history of blood clots -irregular heartbeat -kidney disease -liver disease -lung or breathing disease -an unusual or allergic reaction to carfilzomib, or other medicines, foods, dyes, or preservatives -pregnant or trying to get pregnant -breast-feeding How should I use this medicine? This medicine is for injection or infusion into a vein. It is given by a health care professional in a hospital or clinic setting. Talk to your pediatrician regarding the use of this medicine in children. Special care may be needed. Overdosage: If you think you have taken too much of this medicine contact a poison control center or emergency room at once. NOTE: This medicine is only for you. Do not share this medicine with others. What if I miss a dose? It is important not to miss your dose. Call your doctor or health care professional if you are unable to keep an appointment. What may interact with this medicine? Interactions are not expected. Give your health care provider a list of all the medicines, herbs, non-prescription drugs, or dietary supplements you use. Also tell them if you smoke, drink alcohol, or use illegal drugs. Some items may interact with your medicine. This list may not describe all possible interactions. Give your health care provider a list of all the medicines, herbs, non-prescription drugs, or dietary supplements you use. Also tell them if you smoke, drink alcohol, or use illegal drugs. Some items may  interact with your medicine. What should I watch for while using this medicine? Your condition will be monitored carefully while you are receiving this medicine. Report any side effects. Continue your course of treatment even though you feel ill unless your doctor tells you to stop. You may need blood work done while you are taking this medicine. Do not become pregnant while taking this medicine or for at least 6 months after stopping it. Women should inform their doctor if they wish to become pregnant or think they might be pregnant. There is a potential for serious side effects to an unborn child. Men should not father a child while taking this medicine and for at least 3 months after stopping it. Talk to your health care professional or pharmacist for more information. Do not breast-feed an infant while taking this medicine or for 2 weeks after the last dose. Check with your doctor or health care professional if you get an attack of severe diarrhea, nausea and vomiting, or if you sweat a lot. The loss of too much body fluid can make it dangerous for you to take this medicine. You may get dizzy. Do not drive, use machinery, or do anything that needs mental alertness until you know how this medicine affects you. Do not stand or sit up quickly, especially if you are an older patient. This reduces the risk of dizzy or fainting spells. What side effects may I notice from receiving this medicine? Side effects that you should report to your doctor or health care professional as soon as possible: -allergic reactions like skin rash, itching or hives, swelling of the face, lips, or  tongue -confusion -dizziness -feeling faint or lightheaded -fever or chills -palpitations -seizures -signs and symptoms of bleeding such as bloody or black, tarry stools; red or dark-brown urine; spitting up blood or brown material that looks like coffee grounds; red spots on the skin; unusual bruising or bleeding including from  the eye, gums, or nose -signs and symptoms of a blood clot such as breathing problems; changes in vision; chest pain; severe, sudden headache; pain, swelling, warmth in the leg; trouble speaking; sudden numbness or weakness of the face, arm or leg -signs and symptoms of kidney injury like trouble passing urine or change in the amount of urine -signs and symptoms of liver injury like dark yellow or brown urine; general ill feeling or flu-like symptoms; light-colored stools; loss of appetite; nausea; right upper belly pain; unusually weak or tired; yellowing of the eyes or skin Side effects that usually do not require medical attention (report to your doctor or health care professional if they continue or are bothersome): -back pain -cough -diarrhea -headache -muscle cramps -vomiting This list may not describe all possible side effects. Call your doctor for medical advice about side effects. You may report side effects to FDA at 1-800-FDA-1088. Where should I keep my medicine? This drug is given in a hospital or clinic and will not be stored at home. NOTE: This sheet is a summary. It may not cover all possible information. If you have questions about this medicine, talk to your doctor, pharmacist, or health care provider.  2019 Elsevier/Gold Standard (2017-02-24 14:07:13) Cyclophosphamide injection What is this medicine? CYCLOPHOSPHAMIDE (sye kloe FOSS fa mide) is a chemotherapy drug. It slows the growth of cancer cells. This medicine is used to treat many types of cancer like lymphoma, myeloma, leukemia, breast cancer, and ovarian cancer, to name a few. This medicine may be used for other purposes; ask your health care provider or pharmacist if you have questions. COMMON BRAND NAME(S): Cytoxan, Neosar What should I tell my health care provider before I take this medicine? They need to know if you have any of these conditions: -blood disorders -history of other  chemotherapy -infection -kidney disease -liver disease -recent or ongoing radiation therapy -tumors in the bone marrow -an unusual or allergic reaction to cyclophosphamide, other chemotherapy, other medicines, foods, dyes, or preservatives -pregnant or trying to get pregnant -breast-feeding How should I use this medicine? This drug is usually given as an injection into a vein or muscle or by infusion into a vein. It is administered in a hospital or clinic by a specially trained health care professional. Talk to your pediatrician regarding the use of this medicine in children. Special care may be needed. Overdosage: If you think you have taken too much of this medicine contact a poison control center or emergency room at once. NOTE: This medicine is only for you. Do not share this medicine with others. What if I miss a dose? It is important not to miss your dose. Call your doctor or health care professional if you are unable to keep an appointment. What may interact with this medicine? This medicine may interact with the following medications: -amiodarone -amphotericin B -azathioprine -certain antiviral medicines for HIV or AIDS such as protease inhibitors (e.g., indinavir, ritonavir) and zidovudine -certain blood pressure medications such as benazepril, captopril, enalapril, fosinopril, lisinopril, moexipril, monopril, perindopril, quinapril, ramipril, trandolapril -certain cancer medications such as anthracyclines (e.g., daunorubicin, doxorubicin), busulfan, cytarabine, paclitaxel, pentostatin, tamoxifen, trastuzumab -certain diuretics such as chlorothiazide, chlorthalidone, hydrochlorothiazide, indapamide, metolazone -certain medicines   that treat or prevent blood clots like warfarin -certain muscle relaxants such as succinylcholine -cyclosporine -etanercept -indomethacin -medicines to increase blood counts like filgrastim, pegfilgrastim, sargramostim -medicines used as general  anesthesia -metronidazole -natalizumab This list may not describe all possible interactions. Give your health care provider a list of all the medicines, herbs, non-prescription drugs, or dietary supplements you use. Also tell them if you smoke, drink alcohol, or use illegal drugs. Some items may interact with your medicine. What should I watch for while using this medicine? Visit your doctor for checks on your progress. This drug may make you feel generally unwell. This is not uncommon, as chemotherapy can affect healthy cells as well as cancer cells. Report any side effects. Continue your course of treatment even though you feel ill unless your doctor tells you to stop. Drink water or other fluids as directed. Urinate often, even at night. In some cases, you may be given additional medicines to help with side effects. Follow all directions for their use. Call your doctor or health care professional for advice if you get a fever, chills or sore throat, or other symptoms of a cold or flu. Do not treat yourself. This drug decreases your body's ability to fight infections. Try to avoid being around people who are sick. This medicine may increase your risk to bruise or bleed. Call your doctor or health care professional if you notice any unusual bleeding. Be careful brushing and flossing your teeth or using a toothpick because you may get an infection or bleed more easily. If you have any dental work done, tell your dentist you are receiving this medicine. You may get drowsy or dizzy. Do not drive, use machinery, or do anything that needs mental alertness until you know how this medicine affects you. Do not become pregnant while taking this medicine or for 1 year after stopping it. Women should inform their doctor if they wish to become pregnant or think they might be pregnant. Men should not father a child while taking this medicine and for 4 months after stopping it. There is a potential for serious side  effects to an unborn child. Talk to your health care professional or pharmacist for more information. Do not breast-feed an infant while taking this medicine. This medicine may interfere with the ability to have a child. This medicine has caused ovarian failure in some women. This medicine has caused reduced sperm counts in some men. You should talk with your doctor or health care professional if you are concerned about your fertility. If you are going to have surgery, tell your doctor or health care professional that you have taken this medicine. What side effects may I notice from receiving this medicine? Side effects that you should report to your doctor or health care professional as soon as possible: -allergic reactions like skin rash, itching or hives, swelling of the face, lips, or tongue -low blood counts - this medicine may decrease the number of white blood cells, red blood cells and platelets. You may be at increased risk for infections and bleeding. -signs of infection - fever or chills, cough, sore throat, pain or difficulty passing urine -signs of decreased platelets or bleeding - bruising, pinpoint red spots on the skin, black, tarry stools, blood in the urine -signs of decreased red blood cells - unusually weak or tired, fainting spells, lightheadedness -breathing problems -dark urine -dizziness -palpitations -swelling of the ankles, feet, hands -trouble passing urine or change in the amount of urine -weight gain -  yellowing of the eyes or skin Side effects that usually do not require medical attention (report to your doctor or health care professional if they continue or are bothersome): -changes in nail or skin color -hair loss -missed menstrual periods -mouth sores -nausea, vomiting This list may not describe all possible side effects. Call your doctor for medical advice about side effects. You may report side effects to FDA at 1-800-FDA-1088. Where should I keep my  medicine? This drug is given in a hospital or clinic and will not be stored at home. NOTE: This sheet is a summary. It may not cover all possible information. If you have questions about this medicine, talk to your doctor, pharmacist, or health care provider.  2019 Elsevier/Gold Standard (2012-03-25 16:22:58)

## 2018-08-04 NOTE — Patient Instructions (Signed)

## 2018-08-05 ENCOUNTER — Other Ambulatory Visit: Payer: Self-pay

## 2018-08-05 ENCOUNTER — Encounter: Payer: Self-pay | Admitting: Cardiology

## 2018-08-05 ENCOUNTER — Ambulatory Visit (INDEPENDENT_AMBULATORY_CARE_PROVIDER_SITE_OTHER): Payer: Medicare Other | Admitting: Cardiology

## 2018-08-05 VITALS — BP 170/88 | HR 66 | Ht 71.0 in | Wt 222.1 lb

## 2018-08-05 DIAGNOSIS — I10 Essential (primary) hypertension: Secondary | ICD-10-CM

## 2018-08-05 DIAGNOSIS — C9002 Multiple myeloma in relapse: Secondary | ICD-10-CM | POA: Diagnosis not present

## 2018-08-05 LAB — SAMPLE TO BLOOD BANK

## 2018-08-05 NOTE — Progress Notes (Signed)
Cardiology Office Note:    Date:  08/05/2018   ID:  Robert [name], DOB 1933/08/17, MRN [id number]  PCP:  Leanna Battles, MD  Cardiologist:  Jenne Campus, MD    Referring MD: Leanna Battles, MD   Chief Complaint  Patient presents with  . 3 week follow up  Doing well  History of Present Illness:    Robert [name] is a 85 y.o. male with hypertension which appears to be difficult to control.  Last time when I seen him we spread his beta-blocker more equally so he takes half in the morning half in the afternoon also advised him to take clonidine every single day at the same time in the morning he said his blood pressure at home is good he always have high blood pressure when he comes to the doctor.  I suspect he got whitecoat hypertension on top of that today in the morning he measured his blood pressure 130/80 and he did not take his clonidine I stressed importance of taking clonidine on the regular basis I warned him of the rebound effect of lack of clonidine.  Past Medical History:  Diagnosis Date  . Arthritis   . Goals of care, counseling/discussion 05/12/2018  . Hyperlipidemia   . Hypertension   . Multiple myeloma ([place])    2006    Past Surgical History:  Procedure Laterality Date  . CATARACT EXTRACTION BILATERAL W/ ANTERIOR VITRECTOMY    . IR IMAGING GUIDED PORT INSERTION  05/30/2018  . LIMBAL STEM CELL TRANSPLANT      Current Medications: Current Meds  Medication Sig  . aspirin 81 MG tablet Take 81 mg by mouth daily.  . bimatoprost (LUMIGAN) 0.01 % SOLN Place 1 drop into both eyes at bedtime.   . Calcium Carbonate-Vit D-Min (CALTRATE 600+D PLUS PO) Take 1 tablet by mouth daily.  . carvedilol (COREG) 12.5 MG tablet Take 1 tablet (12.5 mg total) by mouth 2 (two) times daily with a meal.  . Cholecalciferol (VITAMIN D3) 1000 UNITS CAPS Take 1,000 Units by mouth daily.   . cloNIDine (CATAPRES) 0.1 MG tablet Take 1 tablet (0.1 mg total) by mouth daily. Take an extra tablet  as needed for blood pressure greater than 170/100  . cyanocobalamin ([employer] VITAMIN B12) 2000 MCG tablet Take 2,000 mcg by mouth at bedtime.   . famciclovir (FAMVIR) 250 MG tablet Take 1 tablet (250 mg total) by mouth daily.  . hydrochlorothiazide (HYDRODIURIL) 25 MG tablet Take 1 tablet (25 mg total) by mouth 2 (two) times daily for 30 days.  Marland Kitchen losartan (COZAAR) 100 MG tablet Take 100 mg by mouth daily.  . Multiple Vitamin (MULITIVITAMIN WITH MINERALS) TABS Take 1 tablet by mouth daily.  . ondansetron (ZOFRAN) 8 MG tablet Take 1 tablet (8 mg total) by mouth every 8 (eight) hours as needed for nausea or vomiting.  Vladimir Faster Glycol-Propyl Glycol (SYSTANE OP) Place 1 drop into both eyes daily as needed (dry eyes).   . potassium chloride SA (K-DUR,KLOR-CON) 20 MEQ tablet Take 20 mEq by mouth daily.  . pravastatin (PRAVACHOL) 20 MG tablet Take 20 mg by mouth at bedtime.  . prochlorperazine (COMPAZINE) 10 MG tablet Take 1 tablet (10 mg total) by mouth every 6 (six) hours as needed for nausea or vomiting.  . Zoledronic Acid (ZOMETA) 4 MG/100ML IVPB Inject 4 mg into the vein every 4 (four) months. Administered by Dr. Jonette Eva at [facility] - last infusion mid July 2019  Allergies:   Patient has no known allergies.   Social History   Socioeconomic History  . Marital status: Married    Spouse name: Not on file  . Number of children: Not on file  . Years of education: Not on file  . Highest education level: Not on file  Occupational History  . Not on file  Social Needs  . Financial resource strain: Not on file  . Food insecurity:    Worry: Not on file    Inability: Not on file  . Transportation needs:    Medical: Not on file    Non-medical: Not on file  Tobacco Use  . Smoking status: Former Smoker    Packs/day: 4.00    Years: 9.00    Pack years: 36.00    Types: Cigars, Cigarettes    Start date: 07/08/1979    Last attempt to quit: 07/07/1988    Years since quitting: 30.0   . Smokeless tobacco: Never Used  . Tobacco comment: quit 24 years ago  Substance and Sexual Activity  . Alcohol use: Yes    Alcohol/week: 6.0 standard drinks    Types: 6 Glasses of wine per week    Comment: very seldom  . Drug use: No  . Sexual activity: Not on file  Lifestyle  . Physical activity:    Days per week: Not on file    Minutes per session: Not on file  . Stress: Not on file  Relationships  . Social connections:    Talks on phone: Not on file    Gets together: Not on file    Attends religious service: Not on file    Active member of club or organization: Not on file    Attends meetings of clubs or organizations: Not on file    Relationship status: Not on file  Other Topics Concern  . Not on file  Social History Narrative  . Not on file     Family History: The patient's family history includes Diabetes in his brother; Heart attack in his father; Heart disease in his father; Throat cancer in his mother. There is no history of Colon cancer. ROS:   Please see the history of present illness.    All 14 point review of systems negative except as described per history of present illness  EKGs/Labs/Other Studies Reviewed:      Recent Labs: 12/24/2017: Magnesium 2.0; TSH 1.614 08/04/2018: ALT 14; BUN 19; Creatinine 0.92; Hemoglobin 8.8; Platelet Count 176; Potassium 3.7; Sodium 135  Recent Lipid Panel No results found for: CHOL, TRIG, HDL, CHOLHDL, VLDL, LDLCALC, LDLDIRECT  Physical Exam:    VS:  BP (!) 170/88   Pulse 66   Ht '5\' 11"'  (1.803 m)   Wt 222 lb 1.9 oz (100.8 kg)   SpO2 98%   BMI 30.98 kg/m     Wt Readings from Last 3 Encounters:  08/05/18 222 lb 1.9 oz (100.8 kg)  07/28/18 213 lb (96.6 kg)  07/15/18 218 lb 12.8 oz (99.2 kg)     GEN:  Well nourished, well developed in no acute distress HEENT: Normal NECK: No JVD; No carotid bruits LYMPHATICS: No lymphadenopathy CARDIAC: RRR, no murmurs, no rubs, no gallops RESPIRATORY:  Clear to auscultation  without rales, wheezing or rhonchi  ABDOMEN: Soft, non-tender, non-distended MUSCULOSKELETAL:  No edema; No deformity  SKIN: Warm and dry LOWER EXTREMITIES: no swelling NEUROLOGIC:  Alert and oriented x 3 PSYCHIATRIC:  Normal affect   ASSESSMENT:    1. Essential hypertension  2. Multiple myeloma in relapse (HCC)    PLAN:    In order of problems listed above:  1. Essential hypertension better control we will keep monitoring again instruction given how to take medication proper way 2. To myeloma follow-up by oncology like always excellently   Medication Adjustments/Labs and Tests Ordered: Current medicines are reviewed at length with the patient today.  Concerns regarding medicines are outlined above.  No orders of the defined types were placed in this encounter.  Medication changes: No orders of the defined types were placed in this encounter.   Signed, Park Liter, MD, [facility] 08/05/2018 4:20 PM    [place]

## 2018-08-05 NOTE — Patient Instructions (Signed)
Medication Instructions:  Your physician recommends that you continue on your current medications as directed. Please refer to the Current Medication list given to you today.  Your physician recommends that you continue on your current medications as directed. Please refer to the Current Medication list given to you today.  If you need a refill on your cardiac medications before your next appointment, please call your pharmacy.   Lab work: None  If you have labs (blood work) drawn today and your tests are completely normal, you will receive your results only by: Marland Kitchen MyChart Message (if you have MyChart) OR . A paper copy in the mail If you have any lab test that is abnormal or we need to change your treatment, we will call you to review the results.  Testing/Procedures: None  Follow-Up: At [facility], you and your health needs are our priority.  As part of our continuing mission to provide you with exceptional heart care, we have created designated Provider Care Teams.  These Care Teams include your primary Cardiologist (physician) and Advanced Practice Providers (APPs -  Physician Assistants and Nurse Practitioners) who all work together to provide you with the care you need, when you need it. You will need a follow up appointment in 2 months.  Please call our office 2 months in advance to schedule this appointment.  You may see  or another member of our [employer] Provider Team in [place]: Shirlee More, MD . Jyl Heinz, MD  Any Other Special Instructions Will Be Listed Below (If Applicable).

## 2018-08-08 ENCOUNTER — Telehealth: Payer: Self-pay | Admitting: *Deleted

## 2018-08-08 NOTE — Telephone Encounter (Signed)
-----   Message from Volanda Napoleon, MD sent at 08/08/2018  8:14 AM EDT ----- Call - myeloma level is down by 50%!!!!  Great job!!  [name]

## 2018-08-08 NOTE — Telephone Encounter (Signed)
As noted below by Dr. Marin Olp, I left a message informing the patient that the Myeloma level is down by 50%. Instructed him to call if he had any questions or concerns.

## 2018-08-10 ENCOUNTER — Other Ambulatory Visit: Payer: Self-pay | Admitting: *Deleted

## 2018-08-10 DIAGNOSIS — C9002 Multiple myeloma in relapse: Secondary | ICD-10-CM

## 2018-08-11 ENCOUNTER — Inpatient Hospital Stay: Payer: Medicare Other

## 2018-08-11 ENCOUNTER — Other Ambulatory Visit: Payer: Self-pay

## 2018-08-11 VITALS — BP 198/89 | HR 60 | Temp 97.7°F | Resp 18

## 2018-08-11 DIAGNOSIS — Z5112 Encounter for antineoplastic immunotherapy: Secondary | ICD-10-CM | POA: Diagnosis not present

## 2018-08-11 DIAGNOSIS — Z5111 Encounter for antineoplastic chemotherapy: Secondary | ICD-10-CM | POA: Diagnosis not present

## 2018-08-11 DIAGNOSIS — I10 Essential (primary) hypertension: Secondary | ICD-10-CM

## 2018-08-11 DIAGNOSIS — Z9484 Stem cells transplant status: Secondary | ICD-10-CM | POA: Diagnosis not present

## 2018-08-11 DIAGNOSIS — C9002 Multiple myeloma in relapse: Secondary | ICD-10-CM | POA: Diagnosis not present

## 2018-08-11 DIAGNOSIS — D638 Anemia in other chronic diseases classified elsewhere: Secondary | ICD-10-CM | POA: Diagnosis not present

## 2018-08-11 DIAGNOSIS — Z95828 Presence of other vascular implants and grafts: Secondary | ICD-10-CM

## 2018-08-11 LAB — CBC WITH DIFFERENTIAL (CANCER CENTER ONLY)
Abs Immature Granulocytes: 0.04 10*3/uL (ref 0.00–0.07)
Basophils Absolute: 0 10*3/uL (ref 0.0–0.1)
Basophils Relative: 0 %
Eosinophils Absolute: 0.1 10*3/uL (ref 0.0–0.5)
Eosinophils Relative: 3 %
HCT: 25.2 % — ABNORMAL LOW (ref 39.0–52.0)
Hemoglobin: 8.3 g/dL — ABNORMAL LOW (ref 13.0–17.0)
Immature Granulocytes: 1 %
Lymphocytes Relative: 21 %
Lymphs Abs: 0.9 10*3/uL (ref 0.7–4.0)
MCH: 34.2 pg — ABNORMAL HIGH (ref 26.0–34.0)
MCHC: 32.9 g/dL (ref 30.0–36.0)
MCV: 103.7 fL — ABNORMAL HIGH (ref 80.0–100.0)
Monocytes Absolute: 0.7 10*3/uL (ref 0.1–1.0)
Monocytes Relative: 15 %
Neutro Abs: 2.6 10*3/uL (ref 1.7–7.7)
Neutrophils Relative %: 60 %
Platelet Count: 105 10*3/uL — ABNORMAL LOW (ref 150–400)
RBC: 2.43 MIL/uL — ABNORMAL LOW (ref 4.22–5.81)
RDW: 15.3 % (ref 11.5–15.5)
WBC Count: 4.3 10*3/uL (ref 4.0–10.5)
nRBC: 0.5 % — ABNORMAL HIGH (ref 0.0–0.2)

## 2018-08-11 LAB — CMP (CANCER CENTER ONLY)
ALT: 11 U/L (ref 0–44)
AST: 13 U/L — ABNORMAL LOW (ref 15–41)
Albumin: 3.8 g/dL (ref 3.5–5.0)
Alkaline Phosphatase: 54 U/L (ref 38–126)
Anion gap: 6 (ref 5–15)
BUN: 20 mg/dL (ref 8–23)
CO2: 28 mmol/L (ref 22–32)
Calcium: 9.3 mg/dL (ref 8.9–10.3)
Chloride: 103 mmol/L (ref 98–111)
Creatinine: 0.95 mg/dL (ref 0.61–1.24)
GFR, Est AFR Am: >60 mL/min (ref >60)
GFR, Estimated: >60 mL/min (ref >60)
Glucose, Bld: 94 mg/dL (ref 70–99)
Potassium: 3.7 mmol/L (ref 3.5–5.1)
Sodium: 137 mmol/L (ref 135–145)
Total Bilirubin: 1 mg/dL (ref 0.3–1.2)
Total Protein: 6.3 g/dL — ABNORMAL LOW (ref 6.5–8.1)

## 2018-08-11 MED ORDER — CLONIDINE HCL 0.1 MG PO TABS
ORAL_TABLET | ORAL | Status: AC
  Filled 2018-08-11: qty 1

## 2018-08-11 MED ORDER — CLONIDINE HCL 0.1 MG PO TABS
0.1000 mg | ORAL_TABLET | Freq: Once | ORAL | Status: AC
  Administered 2018-08-11: 0.1 mg via ORAL

## 2018-08-11 MED ORDER — HEPARIN SOD (PORK) LOCK FLUSH 100 UNIT/ML IV SOLN
500.0000 [IU] | Freq: Once | INTRAVENOUS | Status: AC | PRN
  Administered 2018-08-11: 500 [IU]
  Filled 2018-08-11: qty 5

## 2018-08-11 MED ORDER — DEXTROSE 5 % IV SOLN: 70.0000 mg/m2 | Freq: Once | INTRAVENOUS | Status: DC

## 2018-08-11 MED ORDER — SODIUM CHLORIDE 0.9 % IV SOLN
Freq: Once | INTRAVENOUS | Status: AC
  Administered 2018-08-11: 13:00:00 via INTRAVENOUS
  Filled 2018-08-11: qty 250

## 2018-08-11 MED ORDER — SODIUM CHLORIDE 0.9 % IV SOLN
Freq: Once | INTRAVENOUS | Status: AC
  Administered 2018-08-11: 12:00:00 via INTRAVENOUS
  Filled 2018-08-11: qty 250

## 2018-08-11 MED ORDER — PROCHLORPERAZINE MALEATE 10 MG PO TABS
10.0000 mg | ORAL_TABLET | Freq: Once | ORAL | Status: AC
  Administered 2018-08-11: 10 mg via ORAL

## 2018-08-11 MED ORDER — SODIUM CHLORIDE 0.9 % IV SOLN
300.0000 mg/m2 | Freq: Once | INTRAVENOUS | Status: AC
  Administered 2018-08-11: 660 mg via INTRAVENOUS
  Filled 2018-08-11: qty 33

## 2018-08-11 MED ORDER — SODIUM CHLORIDE 0.9% FLUSH
10.0000 mL | Freq: Once | INTRAVENOUS | Status: AC
  Administered 2018-08-11: 10 mL via INTRAVENOUS
  Filled 2018-08-11: qty 10

## 2018-08-11 MED ORDER — PROCHLORPERAZINE MALEATE 10 MG PO TABS
ORAL_TABLET | ORAL | Status: AC
  Filled 2018-08-11: qty 1

## 2018-08-11 MED ORDER — SODIUM CHLORIDE 0.9% FLUSH
10.0000 mL | INTRAVENOUS | Status: DC | PRN
  Administered 2018-08-11: 10 mL
  Filled 2018-08-11: qty 10

## 2018-08-11 MED ORDER — SODIUM CHLORIDE 0.9 % IV SOLN
20.0000 mg | Freq: Once | INTRAVENOUS | Status: AC
  Administered 2018-08-11: 20 mg via INTRAVENOUS
  Filled 2018-08-11: qty 2

## 2018-08-11 MED ORDER — DEXTROSE 5 % IV SOLN
68.0000 mg/m2 | Freq: Once | INTRAVENOUS | Status: AC
  Administered 2018-08-11: 150 mg via INTRAVENOUS
  Filled 2018-08-11: qty 60

## 2018-08-11 NOTE — Patient Instructions (Signed)
Carfilzomib injection What is this medicine? CARFILZOMIB (kar FILZ oh mib) targets a specific protein within cancer cells and stops the cancer cells from growing. It is used to treat multiple myeloma. This medicine may be used for other purposes; ask your health care provider or pharmacist if you have questions. COMMON BRAND NAME(S): KYPROLIS What should I tell my health care provider before I take this medicine? They need to know if you have any of these conditions: -heart disease -history of blood clots -irregular heartbeat -kidney disease -liver disease -lung or breathing disease -an unusual or allergic reaction to carfilzomib, or other medicines, foods, dyes, or preservatives -pregnant or trying to get pregnant -breast-feeding How should I use this medicine? This medicine is for injection or infusion into a vein. It is given by a health care professional in a hospital or clinic setting. Talk to your pediatrician regarding the use of this medicine in children. Special care may be needed. Overdosage: If you think you have taken too much of this medicine contact a poison control center or emergency room at once. NOTE: This medicine is only for you. Do not share this medicine with others. What if I miss a dose? It is important not to miss your dose. Call your doctor or health care professional if you are unable to keep an appointment. What may interact with this medicine? Interactions are not expected. Give your health care provider a list of all the medicines, herbs, non-prescription drugs, or dietary supplements you use. Also tell them if you smoke, drink alcohol, or use illegal drugs. Some items may interact with your medicine. This list may not describe all possible interactions. Give your health care provider a list of all the medicines, herbs, non-prescription drugs, or dietary supplements you use. Also tell them if you smoke, drink alcohol, or use illegal drugs. Some items may  interact with your medicine. What should I watch for while using this medicine? Your condition will be monitored carefully while you are receiving this medicine. Report any side effects. Continue your course of treatment even though you feel ill unless your doctor tells you to stop. You may need blood work done while you are taking this medicine. Do not become pregnant while taking this medicine or for at least 6 months after stopping it. Women should inform their doctor if they wish to become pregnant or think they might be pregnant. There is a potential for serious side effects to an unborn child. Men should not father a child while taking this medicine and for at least 3 months after stopping it. Talk to your health care professional or pharmacist for more information. Do not breast-feed an infant while taking this medicine or for 2 weeks after the last dose. Check with your doctor or health care professional if you get an attack of severe diarrhea, nausea and vomiting, or if you sweat a lot. The loss of too much body fluid can make it dangerous for you to take this medicine. You may get dizzy. Do not drive, use machinery, or do anything that needs mental alertness until you know how this medicine affects you. Do not stand or sit up quickly, especially if you are an older patient. This reduces the risk of dizzy or fainting spells. What side effects may I notice from receiving this medicine? Side effects that you should report to your doctor or health care professional as soon as possible: -allergic reactions like skin rash, itching or hives, swelling of the face, lips, or  tongue -confusion -dizziness -feeling faint or lightheaded -fever or chills -palpitations -seizures -signs and symptoms of bleeding such as bloody or black, tarry stools; red or dark-brown urine; spitting up blood or brown material that looks like coffee grounds; red spots on the skin; unusual bruising or bleeding including from  the eye, gums, or nose -signs and symptoms of a blood clot such as breathing problems; changes in vision; chest pain; severe, sudden headache; pain, swelling, warmth in the leg; trouble speaking; sudden numbness or weakness of the face, arm or leg -signs and symptoms of kidney injury like trouble passing urine or change in the amount of urine -signs and symptoms of liver injury like dark yellow or brown urine; general ill feeling or flu-like symptoms; light-colored stools; loss of appetite; nausea; right upper belly pain; unusually weak or tired; yellowing of the eyes or skin Side effects that usually do not require medical attention (report to your doctor or health care professional if they continue or are bothersome): -back pain -cough -diarrhea -headache -muscle cramps -vomiting This list may not describe all possible side effects. Call your doctor for medical advice about side effects. You may report side effects to FDA at 1-800-FDA-1088. Where should I keep my medicine? This drug is given in a hospital or clinic and will not be stored at home. NOTE: This sheet is a summary. It may not cover all possible information. If you have questions about this medicine, talk to your doctor, pharmacist, or health care provider.  2019 [employer] Standard (2017-02-24 14:07:13) Cyclophosphamide injection What is this medicine? CYCLOPHOSPHAMIDE (sye kloe FOSS fa mide) is a chemotherapy drug. It slows the growth of cancer cells. This medicine is used to treat many types of cancer like lymphoma, myeloma, leukemia, breast cancer, and ovarian cancer, to name a few. This medicine may be used for other purposes; ask your health care provider or pharmacist if you have questions. COMMON BRAND NAME(S): Cytoxan, Neosar What should I tell my health care provider before I take this medicine? They need to know if you have any of these conditions: -blood disorders -history of other  chemotherapy -infection -kidney disease -liver disease -recent or ongoing radiation therapy -tumors in the bone marrow -an unusual or allergic reaction to cyclophosphamide, other chemotherapy, other medicines, foods, dyes, or preservatives -pregnant or trying to get pregnant -breast-feeding How should I use this medicine? This drug is usually given as an injection into a vein or muscle or by infusion into a vein. It is administered in a hospital or clinic by a specially trained health care professional. Talk to your pediatrician regarding the use of this medicine in children. Special care may be needed. Overdosage: If you think you have taken too much of this medicine contact a poison control center or emergency room at once. NOTE: This medicine is only for you. Do not share this medicine with others. What if I miss a dose? It is important not to miss your dose. Call your doctor or health care professional if you are unable to keep an appointment. What may interact with this medicine? This medicine may interact with the following medications: -amiodarone -amphotericin B -azathioprine -certain antiviral medicines for HIV or AIDS such as protease inhibitors (e.g., indinavir, ritonavir) and zidovudine -certain blood pressure medications such as benazepril, captopril, enalapril, fosinopril, lisinopril, moexipril, monopril, perindopril, quinapril, ramipril, trandolapril -certain cancer medications such as anthracyclines (e.g., daunorubicin, doxorubicin), busulfan, cytarabine, paclitaxel, pentostatin, tamoxifen, trastuzumab -certain diuretics such as chlorothiazide, chlorthalidone, hydrochlorothiazide, indapamide, metolazone -certain medicines   that treat or prevent blood clots like warfarin -certain muscle relaxants such as succinylcholine -cyclosporine -etanercept -indomethacin -medicines to increase blood counts like filgrastim, pegfilgrastim, sargramostim -medicines used as general  anesthesia -metronidazole -natalizumab This list may not describe all possible interactions. Give your health care provider a list of all the medicines, herbs, non-prescription drugs, or dietary supplements you use. Also tell them if you smoke, drink alcohol, or use illegal drugs. Some items may interact with your medicine. What should I watch for while using this medicine? Visit your doctor for checks on your progress. This drug may make you feel generally unwell. This is not uncommon, as chemotherapy can affect healthy cells as well as cancer cells. Report any side effects. Continue your course of treatment even though you feel ill unless your doctor tells you to stop. Drink water or other fluids as directed. Urinate often, even at night. In some cases, you may be given additional medicines to help with side effects. Follow all directions for their use. Call your doctor or health care professional for advice if you get a fever, chills or sore throat, or other symptoms of a cold or flu. Do not treat yourself. This drug decreases your body's ability to fight infections. Try to avoid being around people who are sick. This medicine may increase your risk to bruise or bleed. Call your doctor or health care professional if you notice any unusual bleeding. Be careful brushing and flossing your teeth or using a toothpick because you may get an infection or bleed more easily. If you have any dental work done, tell your dentist you are receiving this medicine. You may get drowsy or dizzy. Do not drive, use machinery, or do anything that needs mental alertness until you know how this medicine affects you. Do not become pregnant while taking this medicine or for 1 year after stopping it. Women should inform their doctor if they wish to become pregnant or think they might be pregnant. Men should not father a child while taking this medicine and for 4 months after stopping it. There is a potential for serious side  effects to an unborn child. Talk to your health care professional or pharmacist for more information. Do not breast-feed an infant while taking this medicine. This medicine may interfere with the ability to have a child. This medicine has caused ovarian failure in some women. This medicine has caused reduced sperm counts in some men. You should talk with your doctor or health care professional if you are concerned about your fertility. If you are going to have surgery, tell your doctor or health care professional that you have taken this medicine. What side effects may I notice from receiving this medicine? Side effects that you should report to your doctor or health care professional as soon as possible: -allergic reactions like skin rash, itching or hives, swelling of the face, lips, or tongue -low blood counts - this medicine may decrease the number of white blood cells, red blood cells and platelets. You may be at increased risk for infections and bleeding. -signs of infection - fever or chills, cough, sore throat, pain or difficulty passing urine -signs of decreased platelets or bleeding - bruising, pinpoint red spots on the skin, black, tarry stools, blood in the urine -signs of decreased red blood cells - unusually weak or tired, fainting spells, lightheadedness -breathing problems -dark urine -dizziness -palpitations -swelling of the ankles, feet, hands -trouble passing urine or change in the amount of urine -weight gain -  yellowing of the eyes or skin Side effects that usually do not require medical attention (report to your doctor or health care professional if they continue or are bothersome): -changes in nail or skin color -hair loss -missed menstrual periods -mouth sores -nausea, vomiting This list may not describe all possible side effects. Call your doctor for medical advice about side effects. You may report side effects to FDA at 1-800-FDA-1088. Where should I keep my  medicine? This drug is given in a hospital or clinic and will not be stored at home. NOTE: This sheet is a summary. It may not cover all possible information. If you have questions about this medicine, talk to your doctor, pharmacist, or health care provider.  2019 [employer]/Gold Standard (2012-03-25 16:22:58)  

## 2018-08-11 NOTE — Progress Notes (Signed)
OK to treat pt with BP 198/89 per Dr. Maylon Peppers.  VO to give clonidine 0.1 mg.  Per nurse pt took his home BP meds at 10:10 today.

## 2018-08-18 ENCOUNTER — Telehealth: Payer: Self-pay | Admitting: Cardiology

## 2018-08-18 ENCOUNTER — Inpatient Hospital Stay: Payer: Medicare Other

## 2018-08-18 ENCOUNTER — Other Ambulatory Visit: Payer: Self-pay

## 2018-08-18 ENCOUNTER — Encounter: Payer: Self-pay | Admitting: Family

## 2018-08-18 ENCOUNTER — Inpatient Hospital Stay (HOSPITAL_BASED_OUTPATIENT_CLINIC_OR_DEPARTMENT_OTHER): Payer: Medicare Other | Admitting: Family

## 2018-08-18 VITALS — BP 172/72 | HR 57 | Resp 18

## 2018-08-18 VITALS — BP 195/83 | HR 61 | Temp 97.8°F | Resp 18 | Wt 218.0 lb

## 2018-08-18 DIAGNOSIS — I10 Essential (primary) hypertension: Secondary | ICD-10-CM

## 2018-08-18 DIAGNOSIS — D638 Anemia in other chronic diseases classified elsewhere: Secondary | ICD-10-CM | POA: Diagnosis not present

## 2018-08-18 DIAGNOSIS — Z9484 Stem cells transplant status: Secondary | ICD-10-CM

## 2018-08-18 DIAGNOSIS — C9002 Multiple myeloma in relapse: Secondary | ICD-10-CM

## 2018-08-18 DIAGNOSIS — D649 Anemia, unspecified: Secondary | ICD-10-CM

## 2018-08-18 DIAGNOSIS — Z5112 Encounter for antineoplastic immunotherapy: Secondary | ICD-10-CM | POA: Diagnosis not present

## 2018-08-18 DIAGNOSIS — Z5111 Encounter for antineoplastic chemotherapy: Secondary | ICD-10-CM | POA: Diagnosis not present

## 2018-08-18 LAB — CBC WITH DIFFERENTIAL (CANCER CENTER ONLY)
Abs Immature Granulocytes: 0.02 10*3/uL (ref 0.00–0.07)
Basophils Absolute: 0 10*3/uL (ref 0.0–0.1)
Basophils Relative: 1 %
Eosinophils Absolute: 0.1 10*3/uL (ref 0.0–0.5)
Eosinophils Relative: 2 %
HCT: 24.7 % — ABNORMAL LOW (ref 39.0–52.0)
Hemoglobin: 8 g/dL — ABNORMAL LOW (ref 13.0–17.0)
Immature Granulocytes: 1 %
Lymphocytes Relative: 16 %
Lymphs Abs: 0.6 10*3/uL — ABNORMAL LOW (ref 0.7–4.0)
MCH: 33.3 pg (ref 26.0–34.0)
MCHC: 32.4 g/dL (ref 30.0–36.0)
MCV: 102.9 fL — ABNORMAL HIGH (ref 80.0–100.0)
Monocytes Absolute: 0.8 10*3/uL (ref 0.1–1.0)
Monocytes Relative: 22 %
Neutro Abs: 2.1 10*3/uL (ref 1.7–7.7)
Neutrophils Relative %: 58 %
Platelet Count: 110 10*3/uL — ABNORMAL LOW (ref 150–400)
RBC: 2.4 MIL/uL — ABNORMAL LOW (ref 4.22–5.81)
RDW: 15.7 % — ABNORMAL HIGH (ref 11.5–15.5)
WBC Count: 3.5 10*3/uL — ABNORMAL LOW (ref 4.0–10.5)
nRBC: 0 % (ref 0.0–0.2)

## 2018-08-18 LAB — CMP (CANCER CENTER ONLY)
ALT: 10 U/L (ref 0–44)
AST: 12 U/L — ABNORMAL LOW (ref 15–41)
Albumin: 3.7 g/dL (ref 3.5–5.0)
Alkaline Phosphatase: 59 U/L (ref 38–126)
Anion gap: 6 (ref 5–15)
BUN: 19 mg/dL (ref 8–23)
CO2: 28 mmol/L (ref 22–32)
Calcium: 9.3 mg/dL (ref 8.9–10.3)
Chloride: 102 mmol/L (ref 98–111)
Creatinine: 0.95 mg/dL (ref 0.61–1.24)
GFR, Est AFR Am: >60 mL/min (ref >60)
GFR, Estimated: >60 mL/min (ref >60)
Glucose, Bld: 97 mg/dL (ref 70–99)
Potassium: 3.6 mmol/L (ref 3.5–5.1)
Sodium: 136 mmol/L (ref 135–145)
Total Bilirubin: 0.8 mg/dL (ref 0.3–1.2)
Total Protein: 6 g/dL — ABNORMAL LOW (ref 6.5–8.1)

## 2018-08-18 LAB — LACTATE DEHYDROGENASE: LDH: 310 U/L — ABNORMAL HIGH (ref 98–192)

## 2018-08-18 LAB — SAMPLE TO BLOOD BANK

## 2018-08-18 LAB — SAVE SMEAR(SSMR), FOR PROVIDER SLIDE REVIEW

## 2018-08-18 MED ORDER — SODIUM CHLORIDE 0.9 % IV SOLN
20.0000 mg | Freq: Once | INTRAVENOUS | Status: AC
  Administered 2018-08-18: 20 mg via INTRAVENOUS
  Filled 2018-08-18: qty 2

## 2018-08-18 MED ORDER — HEPARIN SOD (PORK) LOCK FLUSH 100 UNIT/ML IV SOLN
500.0000 [IU] | Freq: Once | INTRAVENOUS | Status: AC | PRN
  Administered 2018-08-18: 500 [IU]
  Filled 2018-08-18: qty 5

## 2018-08-18 MED ORDER — SODIUM CHLORIDE 0.9 % IV SOLN
Freq: Once | INTRAVENOUS | Status: AC
  Administered 2018-08-18: 11:00:00 via INTRAVENOUS
  Filled 2018-08-18: qty 250

## 2018-08-18 MED ORDER — SODIUM CHLORIDE 0.9% FLUSH
10.0000 mL | INTRAVENOUS | Status: DC | PRN
  Administered 2018-08-18: 10 mL
  Filled 2018-08-18: qty 10

## 2018-08-18 MED ORDER — PROCHLORPERAZINE MALEATE 10 MG PO TABS
ORAL_TABLET | ORAL | Status: AC
  Filled 2018-08-18: qty 1

## 2018-08-18 MED ORDER — DEXTROSE 5 % IV SOLN
68.0000 mg/m2 | Freq: Once | INTRAVENOUS | Status: AC
  Administered 2018-08-18: 150 mg via INTRAVENOUS
  Filled 2018-08-18: qty 15

## 2018-08-18 MED ORDER — PROCHLORPERAZINE MALEATE 10 MG PO TABS
10.0000 mg | ORAL_TABLET | Freq: Once | ORAL | Status: AC
  Administered 2018-08-18: 10 mg via ORAL

## 2018-08-18 NOTE — Progress Notes (Signed)
Patient denies any symptoms of HTN. He took one 0.1mg  Clonidine at 6am pta and then Judson Roch NP assessed him and determined he is ok to treat. We did recheck BP and it rose slightly, still no symptoms. Patient was instructed to take another 0.1mg  of Clonidine by Judson Roch NP from his personal bottle.  Dr. Raliegh Ip with cardiology across the hall was given a message by me with meds taken and trending BP's this month and was asked to review and call patient with instructions moving forward.

## 2018-08-18 NOTE — Progress Notes (Signed)
Hematology and Oncology Follow Up Visit  Robert [name] [id number] 07-26-1933 84 y.o. 08/18/2018   Principle Diagnosis:  IgG Kappa Myeloma-Relapsed - Trisomy 11, 13q-  Past Therapy: RVD -S/p cycle #3 - revlimid on hold - d/c on 05/12/2018  Current Therapy:   KyCyD- started 06/02/2018 s/p cycle 2 Zometa 4 mg IV q 35months - next dose on 09/2018   Interim History:  Robert [name] is here today for follow-up and treatment. He has the same swelling in his feet and ankles. Pedal pulses are 2+. No redness or weeping.  His Hgb is 8.0, WBC 3.5 and platelets 110.  Last month his M-spike was 0.7, IgG level 1,171 mg/dL and kappa light chains 3.11 mg/dL. Today's results are pending.  He states that some times he feels weak but hopes to start exercising again soon.  He has not noted any episodes of bleeding. No bruising or petechiae.  No fever, chills, n/v, cough, rash, dizziness, SOB, chest pain, palpitations, abdominal pain or changes in bowel or bladder habits.  No tenderness, numbness or tingling in her extremities.  No lymphadenopathy noted on exam.  No falls or syncopal episodes to report.  He has maintained a good appetite and is staying well hydrated. His weight is stable.   ECOG Performance Status: 1 - Symptomatic but completely ambulatory  Medications:  Allergies as of 08/18/2018   No Known Allergies     Medication List       Accurate as of August 18, 2018 10:04 AM. Always use your most recent med list.        aspirin 81 MG tablet Take 81 mg by mouth daily.   bimatoprost 0.01 % Soln Commonly known as:  LUMIGAN Place 1 drop into both eyes at bedtime.   CALTRATE 600+D PLUS PO Take 1 tablet by mouth daily.   carvedilol 12.5 MG tablet Commonly known as:  COREG Take 1 tablet (12.5 mg total) by mouth 2 (two) times daily with a meal.   cloNIDine 0.1 MG tablet Commonly known as:  CATAPRES Take 1 tablet (0.1 mg total) by mouth daily. Take an extra tablet as needed for blood  pressure greater than 170/100   [employer]S VITAMIN B12 2000 MCG tablet Generic drug:  cyanocobalamin Take 2,000 mcg by mouth at bedtime.   famciclovir 250 MG tablet Commonly known as:  FAMVIR Take 1 tablet (250 mg total) by mouth daily.   hydrochlorothiazide 25 MG tablet Commonly known as:  HYDRODIURIL Take 1 tablet (25 mg total) by mouth 2 (two) times daily for 30 days.   losartan 100 MG tablet Commonly known as:  COZAAR Take 100 mg by mouth daily.   multivitamin with minerals Tabs tablet Take 1 tablet by mouth daily.   ondansetron 8 MG tablet Commonly known as:  ZOFRAN Take 1 tablet (8 mg total) by mouth every 8 (eight) hours as needed for nausea or vomiting.   potassium chloride SA 20 MEQ tablet Commonly known as:  K-DUR,KLOR-CON Take 20 mEq by mouth daily.   pravastatin 20 MG tablet Commonly known as:  PRAVACHOL Take 20 mg by mouth at bedtime.   prochlorperazine 10 MG tablet Commonly known as:  COMPAZINE Take 1 tablet (10 mg total) by mouth every 6 (six) hours as needed for nausea or vomiting.   SYSTANE OP Place 1 drop into both eyes daily as needed (dry eyes).   Vitamin D3 25 MCG (1000 UT) Caps Take 1,000 Units by mouth daily.   Zometa 4 MG/100ML IVPB Generic  drug:  Zoledronic Acid Inject 4 mg into the vein every 4 (four) months. Administered by Dr. Jonette Eva at [facility] - last infusion mid July 2019       Allergies: No Known Allergies  Past Medical History, Surgical history, Social history, and Family History were reviewed and updated.  Review of Systems: All other 10 point review of systems is negative.   Physical Exam:  weight is 218 lb (98.9 kg). His temperature is 97.8 F (36.6 C). His blood pressure is 195/83 (abnormal) and his pulse is 61. His respiration is 18 and oxygen saturation is 100%.   Wt Readings from Last 3 Encounters:  08/18/18 218 lb (98.9 kg)  08/05/18 222 lb 1.9 oz (100.8 kg)  07/28/18 213 lb (96.6 kg)    Ocular:  Sclerae unicteric, pupils equal, round and reactive to light Ear-nose-throat: Oropharynx clear, dentition fair Lymphatic: No cervical, supraclavicular or axillary adenopathy Lungs no rales or rhonchi, good excursion bilaterally Heart regular rate and rhythm, no murmur appreciated Abd soft, nontender, positive bowel sounds, no liver or spleen tip palpated on exam, no fluid wave  MSK no focal spinal tenderness, no joint edema Neuro: non-focal, well-oriented, appropriate affect Breasts: Deferred   Lab Results  Component Value Date   WBC 3.5 (L) 08/18/2018   HGB 8.0 (L) 08/18/2018   HCT 24.7 (L) 08/18/2018   MCV 102.9 (H) 08/18/2018   PLT 110 (L) 08/18/2018   Lab Results  Component Value Date   FERRITIN 885 (H) 07/28/2018   IRON 71 07/28/2018   TIBC 303 07/28/2018   UIBC 232 07/28/2018   IRONPCTSAT 23 07/28/2018   Lab Results  Component Value Date   RETICCTPCT 4.5 (H) 07/28/2018   RBC 2.40 (L) 08/18/2018   Lab Results  Component Value Date   KPAFRELGTCHN 31.1 (H) 07/28/2018   LAMBDASER 14.5 07/28/2018   KAPLAMBRATIO 2.14 (H) 07/28/2018   Lab Results  Component Value Date   IGGSERUM 1,171 07/28/2018   IGA 25 (L) 07/28/2018   IGMSERUM 45 07/28/2018   Lab Results  Component Value Date   TOTALPROTELP 6.0 07/28/2018   ALBUMINELP 3.3 07/28/2018   A1GS 0.2 07/28/2018   A2GS 0.5 07/28/2018   BETS 0.9 07/28/2018   BETA2SER 0.3 01/03/2015   GAMS 1.0 07/28/2018   MSPIKE 0.7 (H) 07/28/2018   SPEI Comment 07/28/2018     Chemistry      Component Value Date/Time   NA 136 08/18/2018 0915   NA 137 04/08/2017 1141   NA 138 10/29/2015 1029   K 3.6 08/18/2018 0915   K 3.4 04/08/2017 1141   K 3.9 10/29/2015 1029   CL 102 08/18/2018 0915   CL 102 04/08/2017 1141   CO2 28 08/18/2018 0915   CO2 30 04/08/2017 1141   CO2 27 10/29/2015 1029   BUN 19 08/18/2018 0915   BUN 16 04/08/2017 1141   BUN 13.8 10/29/2015 1029   CREATININE 0.95 08/18/2018 0915   CREATININE 1.3 (H)  04/08/2017 1141   CREATININE 1.1 10/29/2015 1029      Component Value Date/Time   CALCIUM 9.3 08/18/2018 0915   CALCIUM 9.3 04/08/2017 1141   CALCIUM 9.3 10/29/2015 1029   ALKPHOS 59 08/18/2018 0915   ALKPHOS 66 04/08/2017 1141   ALKPHOS 52 10/29/2015 1029   AST 12 (L) 08/18/2018 0915   AST 20 10/29/2015 1029   ALT 10 08/18/2018 0915   ALT 21 04/08/2017 1141   ALT 14 10/29/2015 1029   BILITOT 0.8 08/18/2018  0915   BILITOT 0.75 10/29/2015 1029       Impression and Plan: Mr. Aday is a very pleasant 85 yo African American gentleman with relapsed IgG kappa  myeloma. He has history of stem cell transplant back in 2006. I spoke with Dr. Marin Olp and with anemia associated with the underlying myelodysplasia we will hold the Cytoxan for now. The patient is in agreement with this.  We will otherwise proceed with treatment today as planned.  Cardiology is aware of his persistent HTN. He did take his Coreg and clonidine this morning as prescribed. We will give him another Clonidine now.  We will see him back in another 2 weeks.  He will contact our office with any questions or concerns. We can certainly see him sooner if need be.   Laverna Peace, NP 3/26/202010:04 AM

## 2018-08-18 NOTE — Telephone Encounter (Signed)
Left message for patient to return call.

## 2018-08-18 NOTE — Telephone Encounter (Signed)
Patient was at cancer center today and sent over this information for doctor K. BP: 198/89 on 3/19 and 195/83 on 3/26 taking clonidine 0.1mg  Please advise.

## 2018-08-18 NOTE — Patient Instructions (Signed)
[facility] Discharge Instructions for Patients Receiving Chemotherapy  Today you received the following chemotherapy agents: Kyprolis   To help prevent nausea and vomiting after your treatment, we encourage you to take your nausea medication as directed.    If you develop nausea and vomiting that is not controlled by your nausea medication, call the clinic.   BELOW ARE SYMPTOMS THAT SHOULD BE REPORTED IMMEDIATELY:  *FEVER GREATER THAN 100.5 F  *CHILLS WITH OR WITHOUT FEVER  NAUSEA AND VOMITING THAT IS NOT CONTROLLED WITH YOUR NAUSEA MEDICATION  *UNUSUAL SHORTNESS OF BREATH  *UNUSUAL BRUISING OR BLEEDING  TENDERNESS IN MOUTH AND THROAT WITH OR WITHOUT PRESENCE OF ULCERS  *URINARY PROBLEMS  *BOWEL PROBLEMS  UNUSUAL RASH Items with * indicate a potential emergency and should be followed up as soon as possible.  Feel free to call the clinic you have any questions or concerns. The clinic phone number is [phone].  Please show the CHEMO ALERT CARD at check-in to the Emergency Department and triage nurse.   

## 2018-08-19 ENCOUNTER — Telehealth: Payer: Self-pay | Admitting: Cardiology

## 2018-08-19 DIAGNOSIS — C9002 Multiple myeloma in relapse: Secondary | ICD-10-CM

## 2018-08-19 LAB — KAPPA/LAMBDA LIGHT CHAINS
Kappa free light chain: 15.3 mg/L (ref 3.3–19.4)
Kappa, lambda light chain ratio: 2.47 — ABNORMAL HIGH (ref 0.26–1.65)
Lambda free light chains: 6.2 mg/L (ref 5.7–26.3)

## 2018-08-19 LAB — IGG, IGA, IGM
IgA: 26 mg/dL — ABNORMAL LOW (ref 61–437)
IgG (Immunoglobin G), Serum: 1106 mg/dL (ref 700–1600)
IgM (Immunoglobulin M), Srm: 58 mg/dL (ref 15–143)

## 2018-08-19 MED ORDER — CLONIDINE HCL 0.1 MG PO TABS: 0.1000 mg | ORAL_TABLET | Freq: Two times a day (BID) | ORAL | 1 refills | Status: DC

## 2018-08-19 NOTE — Telephone Encounter (Signed)
Patient had chemo yesterday and prior to starting it was 206/87 he was given clonidine and checked him after he finished chemo, 172/72. He woke up this morning at 3 am 169/97, at 7:30 it 203/112 and he took it once again 218/79.

## 2018-08-19 NOTE — Telephone Encounter (Signed)
Spoke with patient about blood pressure and forwarded message to Dr. Agustin Cree.

## 2018-08-19 NOTE — Telephone Encounter (Signed)
See next phone encounter.

## 2018-08-19 NOTE — Telephone Encounter (Signed)
Patient is asking for a call regarding his elevated blood pressure.

## 2018-08-19 NOTE — Telephone Encounter (Signed)
Increase clonidine to 0.1 mg po bid

## 2018-08-19 NOTE — Telephone Encounter (Signed)
Left another message for patient to return call.

## 2018-08-19 NOTE — Telephone Encounter (Signed)
Patient contacted and advised to increase his clonidine to twice daily. He is to continue monitoring his blood pressure for a few more days and contact the office.

## 2018-08-22 LAB — PROTEIN ELECTROPHORESIS, SERUM, WITH REFLEX
A/G Ratio: 1.3 (ref 0.7–1.7)
Albumin ELP: 3.3 g/dL (ref 2.9–4.4)
Alpha-1-Globulin: 0.2 g/dL (ref 0.0–0.4)
Alpha-2-Globulin: 0.7 g/dL (ref 0.4–1.0)
Beta Globulin: 0.7 g/dL (ref 0.7–1.3)
Gamma Globulin: 1.1 g/dL (ref 0.4–1.8)
Globulin, Total: 2.6 g/dL (ref 2.2–3.9)
M-Spike, %: 0.7 g/dL — ABNORMAL HIGH
SPEP Interpretation: 0
Total Protein ELP: 5.9 g/dL — ABNORMAL LOW (ref 6.0–8.5)

## 2018-08-22 LAB — IMMUNOFIXATION REFLEX, SERUM
IgA: 27 mg/dL — ABNORMAL LOW (ref 61–437)
IgG (Immunoglobin G), Serum: 1152 mg/dL (ref 700–1600)
IgM (Immunoglobulin M), Srm: 56 mg/dL (ref 15–143)

## 2018-09-01 ENCOUNTER — Telehealth: Payer: Self-pay | Admitting: Hematology & Oncology

## 2018-09-01 ENCOUNTER — Other Ambulatory Visit: Payer: Self-pay | Admitting: *Deleted

## 2018-09-01 ENCOUNTER — Inpatient Hospital Stay: Payer: Medicare Other

## 2018-09-01 ENCOUNTER — Other Ambulatory Visit: Payer: Self-pay

## 2018-09-01 ENCOUNTER — Telehealth: Payer: Self-pay | Admitting: *Deleted

## 2018-09-01 ENCOUNTER — Encounter: Payer: Self-pay | Admitting: Family

## 2018-09-01 ENCOUNTER — Inpatient Hospital Stay: Payer: Medicare Other | Attending: Hematology & Oncology | Admitting: Family

## 2018-09-01 VITALS — BP 173/76 | HR 85 | Temp 98.7°F | Resp 19 | Wt 216.0 lb

## 2018-09-01 VITALS — BP 173/76 | HR 99 | Temp 98.4°F | Resp 18

## 2018-09-01 DIAGNOSIS — D649 Anemia, unspecified: Secondary | ICD-10-CM | POA: Insufficient documentation

## 2018-09-01 DIAGNOSIS — C9002 Multiple myeloma in relapse: Secondary | ICD-10-CM | POA: Insufficient documentation

## 2018-09-01 DIAGNOSIS — I10 Essential (primary) hypertension: Secondary | ICD-10-CM | POA: Diagnosis not present

## 2018-09-01 DIAGNOSIS — Z7982 Long term (current) use of aspirin: Secondary | ICD-10-CM | POA: Insufficient documentation

## 2018-09-01 DIAGNOSIS — Z9484 Stem cells transplant status: Secondary | ICD-10-CM | POA: Insufficient documentation

## 2018-09-01 DIAGNOSIS — Z79899 Other long term (current) drug therapy: Secondary | ICD-10-CM | POA: Insufficient documentation

## 2018-09-01 LAB — CMP (CANCER CENTER ONLY)
ALT: 25 U/L (ref 0–44)
AST: 21 U/L (ref 15–41)
Albumin: 3.5 g/dL (ref 3.5–5.0)
Alkaline Phosphatase: 51 U/L (ref 38–126)
Anion gap: 8 (ref 5–15)
BUN: 25 mg/dL — ABNORMAL HIGH (ref 8–23)
CO2: 26 mmol/L (ref 22–32)
Calcium: 9 mg/dL (ref 8.9–10.3)
Chloride: 100 mmol/L (ref 98–111)
Creatinine: 1.14 mg/dL (ref 0.61–1.24)
GFR, Est AFR Am: >60 mL/min (ref >60)
GFR, Estimated: 60 mL/min — ABNORMAL LOW (ref >60)
Glucose, Bld: 123 mg/dL — ABNORMAL HIGH (ref 70–99)
Potassium: 3.3 mmol/L — ABNORMAL LOW (ref 3.5–5.1)
Sodium: 134 mmol/L — ABNORMAL LOW (ref 135–145)
Total Bilirubin: 0.8 mg/dL (ref 0.3–1.2)
Total Protein: 6.3 g/dL — ABNORMAL LOW (ref 6.5–8.1)

## 2018-09-01 LAB — CBC WITH DIFFERENTIAL (CANCER CENTER ONLY)
Abs Immature Granulocytes: 0.04 10*3/uL (ref 0.00–0.07)
Basophils Absolute: 0 10*3/uL (ref 0.0–0.1)
Basophils Relative: 0 %
Eosinophils Absolute: 0.1 10*3/uL (ref 0.0–0.5)
Eosinophils Relative: 1 %
HCT: 22.2 % — ABNORMAL LOW (ref 39.0–52.0)
Hemoglobin: 7.2 g/dL — ABNORMAL LOW (ref 13.0–17.0)
Immature Granulocytes: 1 %
Lymphocytes Relative: 11 %
Lymphs Abs: 0.6 10*3/uL — ABNORMAL LOW (ref 0.7–4.0)
MCH: 32.9 pg (ref 26.0–34.0)
MCHC: 32.4 g/dL (ref 30.0–36.0)
MCV: 101.4 fL — ABNORMAL HIGH (ref 80.0–100.0)
Monocytes Absolute: 0.8 10*3/uL (ref 0.1–1.0)
Monocytes Relative: 13 %
Neutro Abs: 4.3 10*3/uL (ref 1.7–7.7)
Neutrophils Relative %: 74 %
Platelet Count: 198 10*3/uL (ref 150–400)
RBC: 2.19 MIL/uL — ABNORMAL LOW (ref 4.22–5.81)
RDW: 15.9 % — ABNORMAL HIGH (ref 11.5–15.5)
WBC Count: 5.8 10*3/uL (ref 4.0–10.5)
nRBC: 0 % (ref 0.0–0.2)

## 2018-09-01 LAB — LACTATE DEHYDROGENASE: LDH: 285 U/L — ABNORMAL HIGH (ref 98–192)

## 2018-09-01 LAB — PREPARE RBC (CROSSMATCH)

## 2018-09-01 LAB — SAMPLE TO BLOOD BANK

## 2018-09-01 MED ORDER — HEPARIN SOD (PORK) LOCK FLUSH 100 UNIT/ML IV SOLN
500.0000 [IU] | Freq: Once | INTRAVENOUS | Status: AC
  Administered 2018-09-01: 500 [IU] via INTRAVENOUS
  Filled 2018-09-01: qty 5

## 2018-09-01 MED ORDER — SODIUM CHLORIDE 0.9% FLUSH
10.0000 mL | INTRAVENOUS | Status: AC | PRN
  Administered 2018-09-01: 10 mL via INTRAVENOUS
  Filled 2018-09-01: qty 10

## 2018-09-01 NOTE — Patient Instructions (Signed)

## 2018-09-01 NOTE — Addendum Note (Signed)
Addended by: Randolm Idol on: 09/01/2018 09:31 AM   Modules accepted: Orders, SmartSet

## 2018-09-01 NOTE — Telephone Encounter (Signed)
Appointments scheduled calendar printed per 4/9 los 

## 2018-09-01 NOTE — Progress Notes (Signed)
Hematology and Oncology Follow Up Visit  Robert [name] [id number] [birth year] 85 y.o. 09/01/2018   Principle Diagnosis:  IgG Kappa Myeloma-Relapsed - Trisomy 35, 13q-  Past Therapy: RVD -S/p cycle #3 - revlimid on hold - d/c on 05/12/2018  Current Therapy:   KyCyD- started 06/02/2018 s/p cycle3 - Cytoxan on hold since 08/18/2018 Zometa 4 mg IV q 60months - next dose on 09/2018   Interim History:  Robert [name] is here today for follow-up and treatment. He is symptomatic with fatigue, SOB and increased swelling in his lower extremities.  We held Cytoxan at his last treatment due to low Hgb. Hgb today is down to 7.2 from 8.0. WBC count and platelets are stable.  M-spike was 0.7, IgG level 1,106 mg/dL and kappa light chains 1.53 mg/dL.  He denies any episodes of bleeding, no bruising or petechiae.  No fever, chills, n/v, cough, rash, dizziness, chest pain, palpitations, abdominal pain or changes in bowel or bladder habits.  No tenderness, numbness or tingling in his extremities.  No lymphadenopathy noted on exam.  He is eating well and staying hydrated. His weight is stable.   ECOG Performance Status: 1 - Symptomatic but completely ambulatory  Medications:  Allergies as of 09/01/2018   No Known Allergies     Medication List       Accurate as of September 01, 2018  9:20 AM. Always use your most recent med list.        aspirin 81 MG tablet Take 81 mg by mouth daily.   bimatoprost 0.01 % Soln Commonly known as:  LUMIGAN Place 1 drop into both eyes at bedtime.   CALTRATE 600+D PLUS PO Take 1 tablet by mouth daily.   carvedilol 12.5 MG tablet Commonly known as:  COREG Take 1 tablet (12.5 mg total) by mouth 2 (two) times daily with a meal.   cloNIDine 0.1 MG tablet Commonly known as:  CATAPRES Take 1 tablet (0.1 mg total) by mouth 2 (two) times daily. Take an extra tablet as needed for blood pressure greater than 170/100   [employer] VITAMIN B12 2000 MCG tablet Generic drug:   cyanocobalamin Take 2,000 mcg by mouth at bedtime.   famciclovir 250 MG tablet Commonly known as:  FAMVIR Take 1 tablet (250 mg total) by mouth daily.   hydrochlorothiazide 25 MG tablet Commonly known as:  HYDRODIURIL Take 1 tablet (25 mg total) by mouth 2 (two) times daily for 30 days.   losartan 100 MG tablet Commonly known as:  COZAAR Take 100 mg by mouth daily.   multivitamin with minerals Tabs tablet Take 1 tablet by mouth daily.   ondansetron 8 MG tablet Commonly known as:  ZOFRAN Take 1 tablet (8 mg total) by mouth every 8 (eight) hours as needed for nausea or vomiting.   potassium chloride SA 20 MEQ tablet Commonly known as:  K-DUR,KLOR-CON Take 20 mEq by mouth daily.   pravastatin 20 MG tablet Commonly known as:  PRAVACHOL Take 20 mg by mouth at bedtime.   prochlorperazine 10 MG tablet Commonly known as:  COMPAZINE Take 1 tablet (10 mg total) by mouth every 6 (six) hours as needed for nausea or vomiting.   SYSTANE OP Place 1 drop into both eyes daily as needed (dry eyes).   Vitamin D3 25 MCG (1000 UT) Caps Take 1,000 Units by mouth daily.   Zometa 4 MG/100ML IVPB Generic drug:  Zoledronic Acid Inject 4 mg into the vein every 4 (four) months. Administered by Dr. Jonette Eva  at [facility] - last infusion mid July 2019       Allergies: No Known Allergies  Past Medical History, Surgical history, Social history, and Family History were reviewed and updated.  Review of Systems: All other 10 point review of systems is negative.   Physical Exam:  weight is 216 lb (98 kg). His oral temperature is 98.7 F (37.1 C). His blood pressure is 173/76 (abnormal) and his pulse is 85. His respiration is 19 and oxygen saturation is 100%.   Wt Readings from Last 3 Encounters:  09/01/18 216 lb (98 kg)  08/18/18 218 lb (98.9 kg)  08/05/18 222 lb 1.9 oz (100.8 kg)    Ocular: Sclerae unicteric, pupils equal, round and reactive to light Ear-nose-throat:  Oropharynx clear, dentition fair Lymphatic: No cervical or supraclavicular adenopathy Lungs no rales or rhonchi, good excursion bilaterally Heart regular rate and rhythm, no murmur appreciated Abd soft, nontender, positive bowel sounds, no liver or spleen tip palpated on exam, no fluid wave  MSK no focal spinal tenderness, no joint edema Neuro: non-focal, well-oriented, appropriate affect Breasts: Deferred   Lab Results  Component Value Date   WBC 5.8 09/01/2018   HGB 7.2 (L) 09/01/2018   HCT 22.2 (L) 09/01/2018   MCV 101.4 (H) 09/01/2018   PLT 198 09/01/2018   Lab Results  Component Value Date   FERRITIN 885 (H) 07/28/2018   IRON 71 07/28/2018   TIBC 303 07/28/2018   UIBC 232 07/28/2018   IRONPCTSAT 23 07/28/2018   Lab Results  Component Value Date   RETICCTPCT 4.5 (H) 07/28/2018   RBC 2.19 (L) 09/01/2018   Lab Results  Component Value Date   KPAFRELGTCHN 15.3 08/18/2018   LAMBDASER 6.2 08/18/2018   KAPLAMBRATIO 2.47 (H) 08/18/2018   Lab Results  Component Value Date   IGGSERUM 1,152 08/18/2018   IGA 27 (L) 08/18/2018   IGMSERUM 56 08/18/2018   Lab Results  Component Value Date   TOTALPROTELP 5.9 (L) 08/18/2018   ALBUMINELP 3.3 08/18/2018   A1GS 0.2 08/18/2018   A2GS 0.7 08/18/2018   BETS 0.7 08/18/2018   BETA2SER 0.3 01/03/2015   GAMS 1.1 08/18/2018   MSPIKE 0.7 (H) 08/18/2018   SPEI Comment 07/28/2018     Chemistry      Component Value Date/Time   NA 136 08/18/2018 0915   NA 137 04/08/2017 1141   NA 138 10/29/2015 1029   K 3.6 08/18/2018 0915   K 3.4 04/08/2017 1141   K 3.9 10/29/2015 1029   CL 102 08/18/2018 0915   CL 102 04/08/2017 1141   CO2 28 08/18/2018 0915   CO2 30 04/08/2017 1141   CO2 27 10/29/2015 1029   BUN 19 08/18/2018 0915   BUN 16 04/08/2017 1141   BUN 13.8 10/29/2015 1029   CREATININE 0.95 08/18/2018 0915   CREATININE 1.3 (H) 04/08/2017 1141   CREATININE 1.1 10/29/2015 1029      Component Value Date/Time   CALCIUM 9.3  08/18/2018 0915   CALCIUM 9.3 04/08/2017 1141   CALCIUM 9.3 10/29/2015 1029   ALKPHOS 59 08/18/2018 0915   ALKPHOS 66 04/08/2017 1141   ALKPHOS 52 10/29/2015 1029   AST 12 (L) 08/18/2018 0915   AST 20 10/29/2015 1029   ALT 10 08/18/2018 0915   ALT 21 04/08/2017 1141   ALT 14 10/29/2015 1029   BILITOT 0.8 08/18/2018 0915   BILITOT 0.75 10/29/2015 1029       Impression and Plan: Robert [name] is a  very pleasant 85 yo [place] American gentleman with relapsedIgG kappamyeloma.He has history of stem cell transplant back in 2006. He is symptomatic as mentioned above with anemia. We will hold treatment today and give him 1 unit of blood tomorrow.  He is not in any distress at this time.  We will see him back next week for follow-up and repeat lab work. Hopefully we can resume treatment at that time.  He is in agreement with the plan and will contact our office with any questions or concerns. He will go to the ED in the event of an emergency.   Laverna Peace, NP 4/9/20209:20 AM

## 2018-09-01 NOTE — Telephone Encounter (Signed)
Jory Ee NP notified of HGB-7.2.

## 2018-09-02 ENCOUNTER — Other Ambulatory Visit: Payer: Self-pay | Admitting: Hematology & Oncology

## 2018-09-02 ENCOUNTER — Inpatient Hospital Stay: Payer: Medicare Other

## 2018-09-02 DIAGNOSIS — D649 Anemia, unspecified: Secondary | ICD-10-CM

## 2018-09-02 DIAGNOSIS — I10 Essential (primary) hypertension: Secondary | ICD-10-CM | POA: Diagnosis not present

## 2018-09-02 DIAGNOSIS — Z79899 Other long term (current) drug therapy: Secondary | ICD-10-CM | POA: Diagnosis not present

## 2018-09-02 DIAGNOSIS — Z9484 Stem cells transplant status: Secondary | ICD-10-CM | POA: Diagnosis not present

## 2018-09-02 DIAGNOSIS — C9002 Multiple myeloma in relapse: Secondary | ICD-10-CM

## 2018-09-02 DIAGNOSIS — Z7982 Long term (current) use of aspirin: Secondary | ICD-10-CM | POA: Diagnosis not present

## 2018-09-02 MED ORDER — ACETAMINOPHEN 325 MG PO TABS
650.0000 mg | ORAL_TABLET | Freq: Once | ORAL | Status: AC
  Administered 2018-09-02: 650 mg via ORAL

## 2018-09-02 MED ORDER — ACETAMINOPHEN 325 MG PO TABS
ORAL_TABLET | ORAL | Status: AC
  Filled 2018-09-02: qty 2

## 2018-09-02 MED ORDER — DIPHENHYDRAMINE HCL 25 MG PO CAPS
ORAL_CAPSULE | ORAL | Status: AC
  Filled 2018-09-02: qty 1

## 2018-09-02 MED ORDER — DIPHENHYDRAMINE HCL 25 MG PO CAPS
25.0000 mg | ORAL_CAPSULE | Freq: Once | ORAL | Status: AC
  Administered 2018-09-02: 25 mg via ORAL

## 2018-09-02 NOTE — Patient Instructions (Signed)
Blood Transfusion, Adult A blood transfusion is a procedure in which you are given blood through an IV tube. You may need this procedure because of:  Illness.  Surgery.  Injury. The blood may come from someone else (a donor). You may also be able to donate blood for yourself (autologous blood donation). The blood given in a transfusion is made up of different types of cells. You may get:  Red blood cells. These carry oxygen to the cells in the body.  White blood cells. These help you fight infections.  Platelets. These help your blood to clot.  Plasma. This is the liquid part of your blood. It helps with fluid imbalances. If you have a clotting disorder, you may also get other types of blood products. What happens before the procedure?  You will have a blood test to find out your blood type. The test also finds out what type of blood your body will accept and matches it to the donor type.  If you are going to have a planned surgery, you may be able to donate your own blood. This may be done in case you need a transfusion.  If you have had an allergic reaction to a transfusion in the past, you may be given medicine to help prevent a reaction. This medicine may be given to you by mouth or through an IV.  You will have your temperature, blood pressure, and pulse checked.  Follow instructions from your doctor about what you cannot eat or drink.  Ask your doctor about: ? Changing or stopping your regular medicines. This is important if you take diabetes medicines or blood thinners. ? Taking medicines such as aspirin and ibuprofen. These medicines can thin your blood. Do not take these medicines before your procedure if your doctor tells you not to. What happens during the procedure?  An IV tube will be put into one of your veins.  The bag of donated blood will be attached to your IV tube. Then, the blood will enter through your vein.  Your temperature, blood pressure, and pulse will  be checked regularly during the procedure. This is done to find early signs of a transfusion reaction.  If you have any signs or symptoms of a reaction, your transfusion will be stopped. You may also be given medicine.  When the transfusion is done, your IV tube will be taken out.  Pressure may be applied to the IV site for a few minutes.  A bandage (dressing) will be put on the IV site. The procedure may vary among doctors and hospitals. What happens after the procedure?  Your temperature, blood pressure, heart rate, breathing rate, and blood oxygen level will be checked often.  Your blood may be tested to see how you are responding to the transfusion.  You may be warmed with fluids or blankets. This is done to keep the temperature of your body normal. Summary  A blood transfusion is a procedure in which you are given blood through an IV tube.  The blood may come from someone else (a donor). You may also be able to donate blood for yourself.  If you have had an allergic reaction to a transfusion in the past, you may be given medicine to help prevent a reaction. This medicine may be given to you by mouth or through an IV tube.  Your temperature, blood pressure, heart rate, breathing rate, and blood oxygen level will be checked often.  Your blood may be tested to see   how you are responding to the transfusion. This information is not intended to replace advice given to you by your health care provider. Make sure you discuss any questions you have with your health care provider. Document Released: 08/07/2008 Document Revised: 01/03/2016 Document Reviewed: 01/03/2016 Elsevier Interactive Patient Education  2019 Elsevier Inc.  

## 2018-09-04 LAB — TYPE AND SCREEN
ABO/RH(D): AB POS
Antibody Screen: NEGATIVE
Unit division: 0

## 2018-09-04 LAB — BPAM RBC
Blood Product Expiration Date: 202004172359
ISSUE DATE / TIME: 202004100741
Unit Type and Rh: 6200

## 2018-09-06 ENCOUNTER — Telehealth: Payer: Self-pay | Admitting: *Deleted

## 2018-09-06 NOTE — Telephone Encounter (Signed)
Pt called to see if there is something he can take for "increased swelling to his feet and ankles."  Pt states that swelling has decreased since he was here on 09/01/18 and that he has not kept his feet elevated.  Pt instructed to keep his feet elevated above his heart as much as he can.  Jory Ee NP notified and no new orders received at this time.

## 2018-09-08 ENCOUNTER — Inpatient Hospital Stay: Payer: Medicare Other

## 2018-09-08 ENCOUNTER — Other Ambulatory Visit: Payer: Self-pay

## 2018-09-08 ENCOUNTER — Other Ambulatory Visit: Payer: TRICARE For Life (TFL)

## 2018-09-08 ENCOUNTER — Encounter: Payer: Self-pay | Admitting: Hematology & Oncology

## 2018-09-08 ENCOUNTER — Inpatient Hospital Stay (HOSPITAL_BASED_OUTPATIENT_CLINIC_OR_DEPARTMENT_OTHER): Payer: Medicare Other | Admitting: Hematology & Oncology

## 2018-09-08 VITALS — BP 181/88 | HR 76 | Temp 98.5°F | Resp 17 | Wt 217.0 lb

## 2018-09-08 DIAGNOSIS — Z9484 Stem cells transplant status: Secondary | ICD-10-CM

## 2018-09-08 DIAGNOSIS — D649 Anemia, unspecified: Secondary | ICD-10-CM | POA: Diagnosis not present

## 2018-09-08 DIAGNOSIS — C9002 Multiple myeloma in relapse: Secondary | ICD-10-CM

## 2018-09-08 DIAGNOSIS — Z95828 Presence of other vascular implants and grafts: Secondary | ICD-10-CM

## 2018-09-08 DIAGNOSIS — Z7982 Long term (current) use of aspirin: Secondary | ICD-10-CM | POA: Diagnosis not present

## 2018-09-08 DIAGNOSIS — Z79899 Other long term (current) drug therapy: Secondary | ICD-10-CM

## 2018-09-08 DIAGNOSIS — I10 Essential (primary) hypertension: Secondary | ICD-10-CM | POA: Diagnosis not present

## 2018-09-08 LAB — CBC WITH DIFFERENTIAL (CANCER CENTER ONLY)
Abs Immature Granulocytes: 0.03 10*3/uL (ref 0.00–0.07)
Basophils Absolute: 0 10*3/uL (ref 0.0–0.1)
Basophils Relative: 0 %
Eosinophils Absolute: 0 10*3/uL (ref 0.0–0.5)
Eosinophils Relative: 1 %
HCT: 24.2 % — ABNORMAL LOW (ref 39.0–52.0)
Hemoglobin: 7.9 g/dL — ABNORMAL LOW (ref 13.0–17.0)
Immature Granulocytes: 0 %
Lymphocytes Relative: 9 %
Lymphs Abs: 0.6 10*3/uL — ABNORMAL LOW (ref 0.7–4.0)
MCH: 32 pg (ref 26.0–34.0)
MCHC: 32.6 g/dL (ref 30.0–36.0)
MCV: 98 fL (ref 80.0–100.0)
Monocytes Absolute: 0.8 10*3/uL (ref 0.1–1.0)
Monocytes Relative: 12 %
Neutro Abs: 5.3 10*3/uL (ref 1.7–7.7)
Neutrophils Relative %: 78 %
Platelet Count: 236 10*3/uL (ref 150–400)
RBC: 2.47 MIL/uL — ABNORMAL LOW (ref 4.22–5.81)
RDW: 15.9 % — ABNORMAL HIGH (ref 11.5–15.5)
WBC Count: 6.7 10*3/uL (ref 4.0–10.5)
nRBC: 0 % (ref 0.0–0.2)

## 2018-09-08 LAB — CMP (CANCER CENTER ONLY)
ALT: 34 U/L (ref 0–44)
AST: 27 U/L (ref 15–41)
Albumin: 3.4 g/dL — ABNORMAL LOW (ref 3.5–5.0)
Alkaline Phosphatase: 50 U/L (ref 38–126)
Anion gap: 4 — ABNORMAL LOW (ref 5–15)
BUN: 15 mg/dL (ref 8–23)
CO2: 27 mmol/L (ref 22–32)
Calcium: 9.1 mg/dL (ref 8.9–10.3)
Chloride: 95 mmol/L — ABNORMAL LOW (ref 98–111)
Creatinine: 0.96 mg/dL (ref 0.61–1.24)
GFR, Est AFR Am: >60 mL/min (ref >60)
GFR, Estimated: >60 mL/min (ref >60)
Glucose, Bld: 114 mg/dL — ABNORMAL HIGH (ref 70–99)
Potassium: 3.2 mmol/L — ABNORMAL LOW (ref 3.5–5.1)
Sodium: 126 mmol/L — ABNORMAL LOW (ref 135–145)
Total Bilirubin: 0.8 mg/dL (ref 0.3–1.2)
Total Protein: 6.4 g/dL — ABNORMAL LOW (ref 6.5–8.1)

## 2018-09-08 LAB — SAMPLE TO BLOOD BANK

## 2018-09-08 LAB — LACTATE DEHYDROGENASE: LDH: 253 U/L — ABNORMAL HIGH (ref 98–192)

## 2018-09-08 LAB — TYPE AND SCREEN
ABO/RH(D): AB POS
Antibody Screen: NEGATIVE

## 2018-09-08 MED ORDER — MONTELUKAST SODIUM 10 MG PO TABS: 10.0000 mg | ORAL_TABLET | Freq: Every day | ORAL | 0 refills | Status: DC

## 2018-09-08 MED ORDER — SODIUM CHLORIDE 0.9% FLUSH
10.0000 mL | INTRAVENOUS | Status: AC | PRN
  Administered 2018-09-08: 10:00:00 10 mL via INTRAVENOUS
  Filled 2018-09-08: qty 10

## 2018-09-08 MED ORDER — HEPARIN SOD (PORK) LOCK FLUSH 100 UNIT/ML IV SOLN
500.0000 [IU] | Freq: Once | INTRAVENOUS | Status: AC
  Administered 2018-09-08: 500 [IU] via INTRAVENOUS
  Filled 2018-09-08: qty 5

## 2018-09-08 NOTE — Progress Notes (Signed)
DISCONTINUE OFF PATHWAY REGIMEN - Multiple Myeloma and Other Plasma Cell Dyscrasias   [id number] - Weekly (Carfilzomib 20/70 mg/m2 + Dexamethasone 40 mg) q28 Days:   A cycle is every 28 days:     Carfilzomib      Carfilzomib      Carfilzomib      Dexamethasone      Dexamethasone   **Always confirm dose/schedule in your pharmacy ordering system**  REASON: Toxicities / Adverse Event PRIOR TREATMENT: Off Pathway: Kd - Weekly (Carfilzomib 20/70 mg/m2 + Dexamethasone 40 mg) q28 Days TREATMENT RESPONSE: Partial Response (PR)  START ON PATHWAY REGIMEN - Multiple Myeloma and Other Plasma Cell Dyscrasias     Cycles 1 and 2: A cycle is every 28 days:     Daratumumab    Cycles 3 through 6: A cycle is every 28 days:     Daratumumab    Cycles 7 and beyond: A cycle is every 28 days:     Daratumumab   **Always confirm dose/schedule in your pharmacy ordering system**  Patient Characteristics: Relapsed / Refractory, Second through Fourth Lines of Therapy R-ISS Staging: III Disease Classification: Relapsed Line of Therapy: Second Line Intent of Therapy: Non-Curative / Palliative Intent, Discussed with Patient

## 2018-09-08 NOTE — Addendum Note (Signed)
Addended by: Rico Ala on: 09/08/2018 10:10 AM   Modules accepted: Orders, SmartSet

## 2018-09-08 NOTE — Progress Notes (Signed)
Hematology and Oncology Follow Up Visit  MADDOX HLAVATY [id number] 29-Mar-1934 85 y.o. 09/08/2018   Principle Diagnosis:  IgG Kappa Myeloma-Relapsed - Trisomy 28, 13q-  Past Therapy: RVD -S/p cycle #3 - revlimid on hold - d/c on 05/12/2018  Current Therapy:   KyCyD- started 06/02/2018 s/p cycle3 - Cytoxan on hold since 08/18/2018 -- d/c due to poor bone marrow tolerance Daratumumab -- start on 09/15/2018 Zometa 4 mg IV q 99months - next dose on 09/2018   Interim History:  Mr. Rayner is here today for follow-up.  Unfortunately, I think that we are just having problems with his bone marrow tolerating the Kyprolis.  We stopped the Cytoxan.  He is still quite anemic.  I suspect that he probably has an element of myelodysplasia that is causing problems for [place].  I really think that we have to make a change to treatment that might not be as "toxic" to his bone marrow.  The chemotherapy seems to be doing okay with his myeloma.  His last myeloma level was 0.7 g/dL.  His IgG level was 1100 mg/dL.  His kappa light chain was down to 1.5 mg/dL.  I think that daratumumab would be a reasonable option.  This is a monoclonal antibody that we can use.  I think this would be reasonable to try.  I talked to Mr. Strollo about this.  I explained to him why we I thought we had to make a change.  I cannot keep making treatment delays because of his anemia.  With the coronavirus crisis right now, we really cannot transfuse him unless we are not absolutely in need of blood.  I think that the daratumumab will work.  I told Mr. Pulsifer that we would do treatment weekly for 8 weeks and then every 2 weeks for 8 weeks and then monthly.  I sent a prescription of Singulair (10 mg p.o. daily) for him to start taking to help with reactions to the daratumumab.  We are really focused on quality of life.  I want his quality of life to be better.  He has swelling in his legs which I think is from him being anemic all the time.   Overall, he seems to be doing pretty well.  He is eating okay.  Blood pressure still on the high side but not nearly as high.  Because of his hypertension, we really cannot give him any ESA.  Currently, his performance status is ECOG 1.  Medications:  Allergies as of 09/08/2018   No Known Allergies     Medication List       Accurate as of September 08, 2018  9:49 AM. Always use your most recent med list.        aspirin 81 MG tablet Take 81 mg by mouth daily.   bimatoprost 0.01 % Soln Commonly known as:  LUMIGAN Place 1 drop into both eyes at bedtime.   CALTRATE 600+D PLUS PO Take 1 tablet by mouth daily.   carvedilol 12.5 MG tablet Commonly known as:  COREG Take 1 tablet (12.5 mg total) by mouth 2 (two) times daily with a meal.   cloNIDine 0.1 MG tablet Commonly known as:  CATAPRES Take 1 tablet (0.1 mg total) by mouth 2 (two) times daily. Take an extra tablet as needed for blood pressure greater than 170/100   [employer]S VITAMIN B12 2000 MCG tablet Generic drug:  cyanocobalamin Take 2,000 mcg by mouth at bedtime.   famciclovir 250 MG tablet Commonly known as:  FAMVIR Take 1 tablet (250 mg total) by mouth daily.   hydrochlorothiazide 25 MG tablet Commonly known as:  HYDRODIURIL Take 1 tablet (25 mg total) by mouth 2 (two) times daily for 30 days.   losartan 100 MG tablet Commonly known as:  COZAAR Take 100 mg by mouth daily.   multivitamin with minerals Tabs tablet Take 1 tablet by mouth daily.   ondansetron 8 MG tablet Commonly known as:  ZOFRAN Take 1 tablet (8 mg total) by mouth every 8 (eight) hours as needed for nausea or vomiting.   potassium chloride SA 20 MEQ tablet Commonly known as:  K-DUR,KLOR-CON Take 20 mEq by mouth daily.   pravastatin 20 MG tablet Commonly known as:  PRAVACHOL Take 20 mg by mouth at bedtime.   prochlorperazine 10 MG tablet Commonly known as:  COMPAZINE Take 1 tablet (10 mg total) by mouth every 6 (six) hours as needed for  nausea or vomiting.   SYSTANE OP Place 1 drop into both eyes daily as needed (dry eyes).   Vitamin D3 25 MCG (1000 UT) Caps Take 1,000 Units by mouth daily.   Zometa 4 MG/100ML IVPB Generic drug:  Zoledronic Acid Inject 4 mg into the vein every 4 (four) months. Administered by Dr. Jonette Eva at [facility] - last infusion mid July 2019       Allergies: No Known Allergies  Past Medical History, Surgical history, Social history, and Family History were reviewed and updated.  Review of Systems: Review of Systems  Constitutional: Negative.   HENT: Negative.   Eyes: Negative.   Respiratory: Negative.   Cardiovascular: Positive for leg swelling.  Gastrointestinal: Positive for heartburn.  Genitourinary: Negative.   Musculoskeletal: Positive for joint pain.  Skin: Negative.   Neurological: Negative.   Endo/Heme/Allergies: Negative.   Psychiatric/Behavioral: Negative.      Physical Exam:  weight is 217 lb (98.4 kg). His temperature is 98.5 F (36.9 C). His blood pressure is 181/88 (abnormal) and his pulse is 76. His respiration is 17.   Wt Readings from Last 3 Encounters:  09/08/18 217 lb (98.4 kg)  09/08/18 217 lb (98.4 kg)  09/01/18 216 lb (98 kg)    Physical Exam Vitals signs reviewed.  HENT:     Head: Normocephalic and atraumatic.  Eyes:     Pupils: Pupils are equal, round, and reactive to light.  Neck:     Musculoskeletal: Normal range of motion.  Cardiovascular:     Rate and Rhythm: Normal rate and regular rhythm.     Heart sounds: Normal heart sounds.  Pulmonary:     Effort: Pulmonary effort is normal.     Breath sounds: Normal breath sounds.  Abdominal:     General: Bowel sounds are normal.     Palpations: Abdomen is soft.     Comments: Abdominal exam is obese but soft.  He has good bowel sounds.  There is no fluid wave.  There is no palpable liver or spleen tip.  Musculoskeletal: Normal range of motion.        General: No tenderness or  deformity.     Comments: Extremities shows 1+ edema in his lower legs and feet.  This is pitting.  He has decent range of motion of his joints.  He has decent strength.  Lymphadenopathy:     Cervical: No cervical adenopathy.  Skin:    General: Skin is warm and dry.     Findings: No erythema or rash.  Neurological:  Mental Status: He is alert and oriented to person, place, and time.  Psychiatric:        Behavior: Behavior normal.        Thought Content: Thought content normal.        Judgment: Judgment normal.      Lab Results  Component Value Date   WBC 6.7 09/08/2018   HGB 7.9 (L) 09/08/2018   HCT 24.2 (L) 09/08/2018   MCV 98.0 09/08/2018   PLT 236 09/08/2018   Lab Results  Component Value Date   FERRITIN 885 (H) 07/28/2018   IRON 71 07/28/2018   TIBC 303 07/28/2018   UIBC 232 07/28/2018   IRONPCTSAT 23 07/28/2018   Lab Results  Component Value Date   RETICCTPCT 4.5 (H) 07/28/2018   RBC 2.47 (L) 09/08/2018   Lab Results  Component Value Date   KPAFRELGTCHN 15.3 08/18/2018   LAMBDASER 6.2 08/18/2018   KAPLAMBRATIO 2.47 (H) 08/18/2018   Lab Results  Component Value Date   IGGSERUM 1,152 08/18/2018   IGA 27 (L) 08/18/2018   IGMSERUM 56 08/18/2018   Lab Results  Component Value Date   TOTALPROTELP 5.9 (L) 08/18/2018   ALBUMINELP 3.3 08/18/2018   A1GS 0.2 08/18/2018   A2GS 0.7 08/18/2018   BETS 0.7 08/18/2018   BETA2SER 0.3 01/03/2015   GAMS 1.1 08/18/2018   MSPIKE 0.7 (H) 08/18/2018   SPEI Comment 07/28/2018     Chemistry      Component Value Date/Time   NA 126 (L) 09/08/2018 0859   NA 137 04/08/2017 1141   NA 138 10/29/2015 1029   K 3.2 (L) 09/08/2018 0859   K 3.4 04/08/2017 1141   K 3.9 10/29/2015 1029   CL 95 (L) 09/08/2018 0859   CL 102 04/08/2017 1141   CO2 27 09/08/2018 0859   CO2 30 04/08/2017 1141   CO2 27 10/29/2015 1029   BUN 15 09/08/2018 0859   BUN 16 04/08/2017 1141   BUN 13.8 10/29/2015 1029   CREATININE 0.96 09/08/2018 0859    CREATININE 1.3 (H) 04/08/2017 1141   CREATININE 1.1 10/29/2015 1029      Component Value Date/Time   CALCIUM 9.1 09/08/2018 0859   CALCIUM 9.3 04/08/2017 1141   CALCIUM 9.3 10/29/2015 1029   ALKPHOS 50 09/08/2018 0859   ALKPHOS 66 04/08/2017 1141   ALKPHOS 52 10/29/2015 1029   AST 27 09/08/2018 0859   AST 20 10/29/2015 1029   ALT 34 09/08/2018 0859   ALT 21 04/08/2017 1141   ALT 14 10/29/2015 1029   BILITOT 0.8 09/08/2018 0859   BILITOT 0.75 10/29/2015 1029       Impression and Plan: Mr. Brame is a very pleasant 85 yo African American gentleman with relapsedIgG kappamyeloma.He has a history of stem cell transplant back in 2006.  Again, we are going to have to make a change his protocol.  I just do not believe that he can tolerate this with his anemia.  Again I do think that daratumumab will be helpful and will be better tolerated.  I spent about 45 minutes with Mr. Jewkes today.  All the time spent face-to-face.  I went over his labs.  I explained why we had to make a change in his protocol.  I answered his questions.  We will get him set up to start next week.  I will see him back in 3 weeks.   Volanda Napoleon, MD 4/16/20209:49 AM

## 2018-09-08 NOTE — Patient Instructions (Signed)
Implanted Port Insertion, Care After  This sheet gives you information about how to care for yourself after your procedure. Your health care provider may also give you more specific instructions. If you have problems or questions, contact your health care provider.  What can I expect after the procedure?  After the procedure, it is common to have:  · Discomfort at the port insertion site.  · Bruising on the skin over the port. This should improve over 3-4 days.  Follow these instructions at home:  Port care  · After your port is placed, you will get a manufacturer's information card. The card has information about your port. Keep this card with you at all times.  · Take care of the port as told by your health care provider. Ask your health care provider if you or a family member can get training for taking care of the port at home. A home health care nurse may also take care of the port.  · Make sure to remember what type of port you have.  Incision care         · Follow instructions from your health care provider about how to take care of your port insertion site. Make sure you:  ? Wash your hands with soap and water before and after you change your bandage (dressing). If soap and water are not available, use hand sanitizer.  ? Change your dressing as told by your health care provider.  ? Leave stitches (sutures), skin glue, or adhesive strips in place. These skin closures may need to stay in place for 2 weeks or longer. If adhesive strip edges start to loosen and curl up, you may trim the loose edges. Do not remove adhesive strips completely unless your health care provider tells you to do that.  · Check your port insertion site every day for signs of infection. Check for:  ? Redness, swelling, or pain.  ? Fluid or blood.  ? Warmth.  ? Pus or a bad smell.  Activity  · Return to your normal activities as told by your health care provider. Ask your health care provider what activities are safe for you.  · Do not  lift anything that is heavier than 10 lb (4.5 kg), or the limit that you are told, until your health care provider says that it is safe.  General instructions  · Take over-the-counter and prescription medicines only as told by your health care provider.  · Do not take baths, swim, or use a hot tub until your health care provider approves. Ask your health care provider if you may take showers. You may only be allowed to take sponge baths.  · Do not drive for 24 hours if you were given a sedative during your procedure.  · Wear a medical alert bracelet in case of an emergency. This will tell any health care providers that you have a port.  · Keep all follow-up visits as told by your health care provider. This is important.  Contact a health care provider if:  · You cannot flush your port with saline as directed, or you cannot draw blood from the port.  · You have a fever or chills.  · You have redness, swelling, or pain around your port insertion site.  · You have fluid or blood coming from your port insertion site.  · Your port insertion site feels warm to the touch.  · You have pus or a bad smell coming from the port   insertion site.  Get help right away if:  · You have chest pain or shortness of breath.  · You have bleeding from your port that you cannot control.  Summary  · Take care of the port as told by your health care provider. Keep the manufacturer's information card with you at all times.  · Change your dressing as told by your health care provider.  · Contact a health care provider if you have a fever or chills or if you have redness, swelling, or pain around your port insertion site.  · Keep all follow-up visits as told by your health care provider.  This information is not intended to replace advice given to you by your health care provider. Make sure you discuss any questions you have with your health care provider.  Document Released: 03/01/2013 Document Revised: 12/07/2017 Document Reviewed:  12/07/2017  Elsevier Interactive Patient Education © 2019 [employer]

## 2018-09-12 ENCOUNTER — Encounter: Payer: Self-pay | Admitting: *Deleted

## 2018-09-12 ENCOUNTER — Telehealth: Payer: Self-pay

## 2018-09-12 NOTE — Telephone Encounter (Signed)
Patient was called and informed that Dr. Marin Olp advises for him to make an appointment with his cardiologist in regards his swollen, painful feet.  Patient verbalized understanding and said that he will call his cardiologist tomorrow to set up an appointment.

## 2018-09-14 ENCOUNTER — Telehealth: Payer: Self-pay | Admitting: *Deleted

## 2018-09-14 NOTE — Telephone Encounter (Signed)
Robert [name] is scheduled for new chemotherapy tomorrow. He called the office stating he wasn't feeling well enough to start a new treatment tomorrow. He states he still feel weak, and his feet are still swollen. Symptoms reviewed with Dr Marin Olp. He is okay with patient pushing back his appointment one week.  Message sent to scheduler.

## 2018-09-15 ENCOUNTER — Telehealth: Payer: Self-pay

## 2018-09-15 ENCOUNTER — Emergency Department (HOSPITAL_COMMUNITY): Payer: Medicare Other

## 2018-09-15 ENCOUNTER — Other Ambulatory Visit: Payer: TRICARE For Life (TFL)

## 2018-09-15 ENCOUNTER — Emergency Department (HOSPITAL_COMMUNITY)
Admission: EM | Admit: 2018-09-15 | Discharge: 2018-09-15 | Disposition: A | Discharge status: Home / Self Care | Payer: Medicare Other | Attending: Emergency Medicine | Admitting: Emergency Medicine

## 2018-09-15 ENCOUNTER — Other Ambulatory Visit: Payer: Self-pay

## 2018-09-15 ENCOUNTER — Ambulatory Visit: Payer: TRICARE For Life (TFL)

## 2018-09-15 ENCOUNTER — Encounter (HOSPITAL_COMMUNITY): Payer: Self-pay | Admitting: Emergency Medicine

## 2018-09-15 DIAGNOSIS — Z7982 Long term (current) use of aspirin: Secondary | ICD-10-CM | POA: Insufficient documentation

## 2018-09-15 DIAGNOSIS — R609 Edema, unspecified: Secondary | ICD-10-CM | POA: Diagnosis not present

## 2018-09-15 DIAGNOSIS — Z79899 Other long term (current) drug therapy: Secondary | ICD-10-CM | POA: Insufficient documentation

## 2018-09-15 DIAGNOSIS — Z87891 Personal history of nicotine dependence: Secondary | ICD-10-CM | POA: Diagnosis not present

## 2018-09-15 DIAGNOSIS — I10 Essential (primary) hypertension: Secondary | ICD-10-CM | POA: Diagnosis not present

## 2018-09-15 DIAGNOSIS — Z8579 Personal history of other malignant neoplasms of lymphoid, hematopoietic and related tissues: Secondary | ICD-10-CM | POA: Diagnosis not present

## 2018-09-15 DIAGNOSIS — R600 Localized edema: Secondary | ICD-10-CM | POA: Diagnosis present

## 2018-09-15 LAB — COMPREHENSIVE METABOLIC PANEL
ALT: 46 U/L — ABNORMAL HIGH (ref 0–44)
AST: 57 U/L — ABNORMAL HIGH (ref 15–41)
Albumin: 2.9 g/dL — ABNORMAL LOW (ref 3.5–5.0)
Alkaline Phosphatase: 57 U/L (ref 38–126)
Anion gap: 9 (ref 5–15)
BUN: 18 mg/dL (ref 8–23)
CO2: 25 mmol/L (ref 22–32)
Calcium: 8.8 mg/dL — ABNORMAL LOW (ref 8.9–10.3)
Chloride: 99 mmol/L (ref 98–111)
Creatinine, Ser: 1.05 mg/dL (ref 0.61–1.24)
GFR calc Af Amer: >60 mL/min (ref >60)
GFR calc non Af Amer: >60 mL/min (ref >60)
Glucose, Bld: 116 mg/dL — ABNORMAL HIGH (ref 70–99)
Potassium: 4 mmol/L (ref 3.5–5.1)
Sodium: 133 mmol/L — ABNORMAL LOW (ref 135–145)
Total Bilirubin: 1.5 mg/dL — ABNORMAL HIGH (ref 0.3–1.2)
Total Protein: 7.2 g/dL (ref 6.5–8.1)

## 2018-09-15 LAB — URINALYSIS, ROUTINE W REFLEX MICROSCOPIC
Bilirubin Urine: NEGATIVE
Glucose, UA: NEGATIVE mg/dL
Hgb urine dipstick: NEGATIVE
Ketones, ur: NEGATIVE mg/dL
Leukocytes,Ua: NEGATIVE
Nitrite: NEGATIVE
Protein, ur: NEGATIVE mg/dL
Specific Gravity, Urine: 1.012 (ref 1.005–1.030)
pH: 7 (ref 5.0–8.0)

## 2018-09-15 LAB — CBC WITH DIFFERENTIAL/PLATELET
Abs Immature Granulocytes: 0.03 10*3/uL (ref 0.00–0.07)
Basophils Absolute: 0 10*3/uL (ref 0.0–0.1)
Basophils Relative: 0 %
Eosinophils Absolute: 0 10*3/uL (ref 0.0–0.5)
Eosinophils Relative: 0 %
HCT: 24.6 % — ABNORMAL LOW (ref 39.0–52.0)
Hemoglobin: 8 g/dL — ABNORMAL LOW (ref 13.0–17.0)
Immature Granulocytes: 0 %
Lymphocytes Relative: 7 %
Lymphs Abs: 0.6 10*3/uL — ABNORMAL LOW (ref 0.7–4.0)
MCH: 31.9 pg (ref 26.0–34.0)
MCHC: 32.5 g/dL (ref 30.0–36.0)
MCV: 98 fL (ref 80.0–100.0)
Monocytes Absolute: 1 10*3/uL (ref 0.1–1.0)
Monocytes Relative: 12 %
Neutro Abs: 6.7 10*3/uL (ref 1.7–7.7)
Neutrophils Relative %: 81 %
Platelets: 285 10*3/uL (ref 150–400)
RBC: 2.51 MIL/uL — ABNORMAL LOW (ref 4.22–5.81)
RDW: 16.2 % — ABNORMAL HIGH (ref 11.5–15.5)
WBC: 8.3 10*3/uL (ref 4.0–10.5)
nRBC: 0 % (ref 0.0–0.2)

## 2018-09-15 LAB — BRAIN NATRIURETIC PEPTIDE: B Natriuretic Peptide: 76.7 pg/mL (ref 0.0–100.0)

## 2018-09-15 LAB — PROTIME-INR
INR: 1.1 (ref 0.8–1.2)
Prothrombin Time: 14.1 seconds (ref 11.4–15.2)

## 2018-09-15 MED ORDER — FUROSEMIDE 40 MG PO TABS
20.0000 mg | ORAL_TABLET | Freq: Once | ORAL | Status: AC
  Administered 2018-09-15: 20 mg via ORAL
  Filled 2018-09-15: qty 0.5

## 2018-09-15 MED ORDER — FUROSEMIDE 20 MG PO TABS: 20.0000 mg | ORAL_TABLET | Freq: Every day | ORAL | 0 refills | Status: DC

## 2018-09-15 NOTE — Discharge Instructions (Signed)
Return for shortness of breath, if you feel like you are going to pass out.   Call your oncologist and let them know what is going on.  Your lab work does not show an issue with your liver kidneys or heart.  The medicine I am giving you to decrease the amount of fluid in your body will cause you to lose potassium.  Please try and eat something that is high in potassium with every meal.

## 2018-09-15 NOTE — Telephone Encounter (Signed)
Patient has pedal edema in both feet and both hands for almost two weeks. No shortness of breath, no chest pain. He is urinating more often then usual. Patient called wanting medication to help with swelling and he spoke with Dr. Marin Olp and was told to contact Dr. Raliegh Ip. Patient does not want to schedule appt but wants Dr.K to send medication over to pharmacy???Request sent to Dr. Raliegh Ip for advisement.

## 2018-09-15 NOTE — Telephone Encounter (Signed)
Telephoned patient and told him Dr. Agustin Cree wanted to have a televisit with him tomorrow. Patient declined and stated he wants to go to the ED at [facility]. Advised we would keep an eye out for his ED visit.

## 2018-09-15 NOTE — ED Notes (Signed)
Pt has bilateral LE Edema 3+. Pt reports having increased difficulty ambulating x 2 weeks.

## 2018-09-15 NOTE — ED Provider Notes (Signed)
[facility] DEPT Provider Note   CSN: [id number] Arrival date & time: 09/15/18  1310    History   Chief Complaint Chief Complaint  Patient presents with  . Leg Swelling    HPI Robert [name] is a 85 y.o. male.     85 yo M with a chief complaint of extremity edema.  This been going on for the past couple weeks.  He been seen by his oncologist in the office and she it was thought to be secondary to his chemotherapy or his anemia.  Since then he has had worsening edema feels that his legs are heavy and he has trouble getting up and moving around at home.  His wife got him a walker a couple days ago.  He has been having increased urgency to use the bathroom.  He denies back pain denies chest pain abdominal pain denies shortness of breath.  Denies fevers or chills.  He was supposed to start a new immuno therapy today but he called and said he was too weak to come.  Decided to come to the ED for evaluation.  The history is provided by the patient.  Illness  Severity:  Mild Onset quality:  Sudden Duration:  2 weeks Timing:  Constant Progression:  Worsening Chronicity:  New Associated symptoms: no abdominal pain, no chest pain, no congestion, no diarrhea, no fever, no headaches, no myalgias, no rash, no shortness of breath and no vomiting     Past Medical History:  Diagnosis Date  . Arthritis   . Goals of care, counseling/discussion 05/12/2018  . Hyperlipidemia   . Hypertension   . Multiple myeloma ([place])    2006    Patient Active Problem List   Diagnosis Date Noted  . Essential hypertension 07/15/2018  . Goals of care, counseling/discussion 05/12/2018  . Syncope 12/24/2017  . Multiple myeloma in relapse ([place]) 07/30/2016  . Lytic bone lesions on xray 03/17/2016  . Myeloma ([place]) 07/02/2011    Past Surgical History:  Procedure Laterality Date  . CATARACT EXTRACTION BILATERAL W/ ANTERIOR VITRECTOMY    . IR IMAGING GUIDED PORT INSERTION   05/30/2018  . LIMBAL STEM CELL TRANSPLANT          Home Medications    Prior to Admission medications   Medication Sig Start Date End Date Taking? Authorizing Provider  aspirin 81 MG tablet Take 81 mg by mouth daily.    [provider]  bimatoprost (LUMIGAN) 0.01 % SOLN Place 1 drop into both eyes at bedtime.  12/07/11   [provider]  Calcium Carbonate-Vit D-Min (CALTRATE 600+D PLUS PO) Take 1 tablet by mouth daily.    [provider]  carvedilol (COREG) 12.5 MG tablet Take 1 tablet (12.5 mg total) by mouth 2 (two) times daily with a meal. 07/15/18   Park Liter, MD  Cholecalciferol (VITAMIN D3) 1000 UNITS CAPS Take 1,000 Units by mouth daily.     [provider]  cloNIDine (CATAPRES) 0.1 MG tablet Take 1 tablet (0.1 mg total) by mouth 2 (two) times daily. Take an extra tablet as needed for blood pressure greater than 170/100 08/19/18   Park Liter, MD  cyanocobalamin ([employer] VITAMIN B12) 2000 MCG tablet Take 2,000 mcg by mouth at bedtime.     [provider]  famciclovir (FAMVIR) 250 MG tablet Take 1 tablet (250 mg total) by mouth daily. 01/27/18   Volanda Napoleon, MD  furosemide (LASIX) 20 MG tablet Take 1 tablet (20  mg total) by mouth daily. 09/15/18   Deno Etienne, DO  hydrochlorothiazide (HYDRODIURIL) 25 MG tablet Take 1 tablet (25 mg total) by mouth 2 (two) times daily for 30 days. 07/14/18 08/13/18  Curatolo, Adam, DO  losartan (COZAAR) 100 MG tablet Take 100 mg by mouth daily.    [provider]  montelukast (SINGULAIR) 10 MG tablet Take 1 tablet (10 mg total) by mouth at bedtime. 09/08/18   Volanda Napoleon, MD  Multiple Vitamin (MULITIVITAMIN WITH MINERALS) TABS Take 1 tablet by mouth daily.    [provider]  ondansetron (ZOFRAN) 8 MG tablet Take 1 tablet (8 mg total) by mouth every 8 (eight) hours as needed for nausea or vomiting. 06/02/18   Volanda Napoleon, MD  Polyethyl Glycol-Propyl Glycol (SYSTANE OP) Place 1  drop into both eyes daily as needed (dry eyes).     [provider]  potassium chloride SA (K-DUR,KLOR-CON) 20 MEQ tablet Take 20 mEq by mouth daily.    [provider]  pravastatin (PRAVACHOL) 20 MG tablet Take 20 mg by mouth at bedtime.    [provider]  prochlorperazine (COMPAZINE) 10 MG tablet Take 1 tablet (10 mg total) by mouth every 6 (six) hours as needed for nausea or vomiting. 02/03/18   Volanda Napoleon, MD  Zoledronic Acid (ZOMETA) 4 MG/100ML IVPB Inject 4 mg into the vein every 4 (four) months. Administered by Dr. Jonette Eva at [facility] - last infusion mid July 2019    [provider]    Family History Family History  Problem Relation Age of Onset  . Heart attack Father   . Heart disease Father   . Throat cancer Mother   . Diabetes Brother   . Colon cancer Neg Hx     Social History Social History   Tobacco Use  . Smoking status: Former Smoker    Packs/day: 4.00    Years: 9.00    Pack years: 36.00    Types: Cigars, Cigarettes    Start date: 07/08/1979    Last attempt to quit: 07/07/1988    Years since quitting: 30.2  . Smokeless tobacco: Never Used  . Tobacco comment: quit 24 years ago  Substance Use Topics  . Alcohol use: Yes    Alcohol/week: 6.0 standard drinks    Types: 6 Glasses of wine per week    Comment: very seldom  . Drug use: No     Allergies   Patient has no known allergies.   Review of Systems Review of Systems  Constitutional: Negative for chills and fever.  HENT: Negative for congestion and facial swelling.   Eyes: Negative for discharge and visual disturbance.  Respiratory: Negative for shortness of breath.   Cardiovascular: Positive for leg swelling. Negative for chest pain and palpitations.  Gastrointestinal: Negative for abdominal pain, diarrhea and vomiting.  Genitourinary: Positive for urgency.  Musculoskeletal: Negative for arthralgias and myalgias.  Skin: Negative for color change  and rash.  Neurological: Negative for tremors, syncope and headaches.  Psychiatric/Behavioral: Negative for confusion and dysphoric mood.     Physical Exam Updated Vital Signs BP (!) 181/89 (BP Location: Right Arm)   Pulse 81   Temp 98.3 F (36.8 C) (Oral)   Resp 20   SpO2 100%   Physical Exam Vitals signs and nursing note reviewed.  Constitutional:      Appearance: He is well-developed.  HENT:     Head: Normocephalic and atraumatic.  Eyes:     Pupils:  Pupils are equal, round, and reactive to light.  Neck:     Musculoskeletal: Normal range of motion and neck supple.     Vascular: No JVD.  Cardiovascular:     Rate and Rhythm: Normal rate and regular rhythm.     Heart sounds: No murmur. No friction rub. No gallop.   Pulmonary:     Effort: No respiratory distress.     Breath sounds: No wheezing.  Abdominal:     General: There is no distension.     Tenderness: There is no guarding or rebound.  Musculoskeletal: Normal range of motion.     Right lower leg: Edema present.     Left lower leg: Edema present.     Comments: Bilateral lower extremity edema 2+ up to the knees.  No appreciable edema to the hands.  Skin:    Coloration: Skin is not pale.     Findings: No rash.  Neurological:     Mental Status: He is alert and oriented to person, place, and time.  Psychiatric:        Behavior: Behavior normal.      ED Treatments / Results  Labs (all labs ordered are listed, but only abnormal results are displayed) Labs Reviewed  CBC WITH DIFFERENTIAL/PLATELET - Abnormal; Notable for the following components:      Result Value   RBC 2.51 (*)    Hemoglobin 8.0 (*)    HCT 24.6 (*)    RDW 16.2 (*)    Lymphs Abs 0.6 (*)    All other components within normal limits  COMPREHENSIVE METABOLIC PANEL - Abnormal; Notable for the following components:   Sodium 133 (*)    Glucose, Bld 116 (*)    Calcium 8.8 (*)    Albumin 2.9 (*)    AST 57 (*)    ALT 46 (*)    Total Bilirubin  1.5 (*)    All other components within normal limits  URINALYSIS, ROUTINE W REFLEX MICROSCOPIC  PROTIME-INR  BRAIN NATRIURETIC PEPTIDE    EKG None  Radiology Dg Chest 2 View  Result Date: 09/15/2018 CLINICAL DATA:  85 year old male with a history of multiple myeloma 1 year ago EXAM: CHEST - 2 VIEW COMPARISON:  12/24/2017, PET-CT 02/02/2018 FINDINGS: Cardiomediastinal silhouette unchanged in size and contour. Interval placement of right IJ port catheter with the tip appearing to terminate superior vena cava. No pneumothorax or pleural effusion. No confluent airspace disease. No interlobular septal thickening/central vascular congestion. IMPRESSION: Chronic lung changes without evidence of acute cardiopulmonary disease Electronically Signed   By: Corrie Mckusick D.O.   On: 09/15/2018 14:03    Procedures Procedures (including critical care time)  Medications Ordered in ED Medications  furosemide (LASIX) tablet 20 mg (has no administration in time range)     Initial Impression / Assessment and Plan / ED Course  I have reviewed the triage vital signs and the nursing notes.  Pertinent labs & imaging results that were available during my care of the patient were reviewed by me and considered in my medical decision making (see chart for details).        85 yo M with a chief complaint of worsening peripheral edema.  This most likely side effect of his chemotherapy for his multiple myeloma.  Will screen for new renal or liver dysfunction.  Screen for new heart failure.  He has clear lung sounds for me with no JVD or rales.  CXR without increased edema as viewed by me.  Renal  function normal, no significant LFT elevation.  Mild Tbili elevation, albumin low. INR normal. Will give a dose of lasix.   BNP is negative.  UA is without infection.  Will discharge the patient.  Short course of Lasix.  Have him call his oncologist.    3:15 PM:  I have discussed the diagnosis/risks/treatment options  with the patient and believe the pt to be eligible for discharge home to follow-up with PCP, oncology. We also discussed returning to the ED immediately if new or worsening sx occur. We discussed the sx which are most concerning (e.g., sudden worsening pain, fever, inability to tolerate by mouth) that necessitate immediate return. Medications administered to the patient during their visit and any new prescriptions provided to the patient are listed below.  Medications given during this visit Medications  furosemide (LASIX) tablet 20 mg (has no administration in time range)     The patient appears reasonably screen and/or stabilized for discharge and I doubt any other medical condition or other [facility] requiring further screening, evaluation, or treatment in the ED at this time prior to discharge.   Final Clinical Impressions(s) / ED Diagnoses   Final diagnoses:  Peripheral edema    ED Discharge Orders         Ordered    furosemide (LASIX) 20 MG tablet  Daily     09/15/18 [place], Kamber Vignola, DO 09/15/18 1515

## 2018-09-15 NOTE — ED Triage Notes (Addendum)
Patient c/o bilateral hands and feet x3 weeks. Hx multiple myeloma. Last treatment x1 week ago. Denies SOB, chest pain, fevers. Reports taking HCTZ and clonidine as prescribed.

## 2018-09-20 ENCOUNTER — Telehealth: Payer: Self-pay | Admitting: *Deleted

## 2018-09-20 NOTE — Telephone Encounter (Signed)
This is most likely not cardiac, it is most likely related to low protein,  Is there any improvement with 20 of lasix?

## 2018-09-20 NOTE — Telephone Encounter (Signed)
Pt's is experiencing swelling in feet, ankles and legs to above knees, also hands/left more than right. Pt had to lay down after breakfast, getting sleepy. Took 45 min. Nap and feels better. Please advise.

## 2018-09-20 NOTE — Telephone Encounter (Signed)
Patient reports leg swelling for over two weeks that has gotten worse. He denies any shortness of breath or weight gain. He was recently seen in the ed for the leg swelling. He is  taking lasix 20 mg daily at this time. Informed patient I will consult with Dr. Agustin Cree and then follow back up with patient. Patient verbally understands.

## 2018-09-20 NOTE — Telephone Encounter (Signed)
Call received from patient concerned about his increased edema to lower extremities.  Dr. Marin Olp notified and pt instructed to call his Cardiologist regarding edema per order of Dr. Marin Olp.  Pt states that he will call his Cardiologist now.

## 2018-09-21 ENCOUNTER — Telehealth: Payer: Self-pay | Admitting: *Deleted

## 2018-09-21 NOTE — Telephone Encounter (Signed)
Pt would rather increase the Lasix. He asked if you would call into [facility] if need to. Please call back with the instructions for increase.

## 2018-09-21 NOTE — Telephone Encounter (Signed)
Called patient back he reports he doesn't feel the lasix is working very well. He previously took hctz 25 mg bid, however he isn't taking this now for "a little while" he isn't sure how long he has been off of it. Will consult further with Dr. Agustin Cree.

## 2018-09-21 NOTE — Telephone Encounter (Signed)
D/c HCTZ, increase lasix to 40 mg po qd

## 2018-09-21 NOTE — Telephone Encounter (Signed)
Error

## 2018-09-22 ENCOUNTER — Inpatient Hospital Stay: Payer: Medicare Other

## 2018-09-22 MED ORDER — FUROSEMIDE 20 MG PO TABS: 40.0000 mg | ORAL_TABLET | Freq: Every day | ORAL | 1 refills | Status: DC

## 2018-09-22 NOTE — Telephone Encounter (Signed)
Patient informed to increase lasix to 40 mg daily and remain off of hydralazine. Patient verbally understands. No further questions.

## 2018-09-22 NOTE — Addendum Note (Signed)
Addended by: Ashok Norris on: 09/22/2018 09:29 AM   Modules accepted: Orders

## 2018-09-22 NOTE — Addendum Note (Signed)
Addended by: Ashok Norris on: 09/22/2018 09:30 AM   Modules accepted: Orders

## 2018-09-26 ENCOUNTER — Telehealth: Payer: Self-pay | Admitting: Cardiology

## 2018-09-26 NOTE — Telephone Encounter (Signed)
Patient was put on Lasix 09/22/2018 but is still having a lot of swelling ankles, feet, and legs.  Please call patient to discuss

## 2018-09-26 NOTE — Telephone Encounter (Signed)
Patient complains of swelling, tightness and pain above both ankles. He states it hurts to bear weight when he stands up initially but goes away after walking with her walker for a little while. Reports pain is a 6/10 when he first stands up, it has improved a little since lasix dose increased from 20mg /day to 40mg /day but its still there. Lower leg swelling has decreased some but not a lot. He reports elevating legs which doesn't help. Weight this morning is #209, down from normal weight of #214-215. BP 140/86 and HR 92 checked during this call. Denies shortness of breath, chest discomfort/burning tightness.   Patient is requesting something for pain.    pls advise, tx

## 2018-09-28 ENCOUNTER — Telehealth: Payer: Self-pay | Admitting: Hematology & Oncology

## 2018-09-28 ENCOUNTER — Telehealth: Payer: Self-pay | Admitting: *Deleted

## 2018-09-28 NOTE — Telephone Encounter (Signed)
Patient called in to cancel treatment for 5/7 and move his appointments from the AM to the afternoon /  Appts have been moved/ OK to cancel treatment per [employer]

## 2018-09-28 NOTE — Telephone Encounter (Signed)
I do not think that his symptoms are related to his heart. Keep the same cardiac management, needs to talk to hi PMD

## 2018-09-28 NOTE — Telephone Encounter (Signed)
Patient wants to cancel his appointments for tomorrow because he doesn't feel well. He has cancelled his last few appointments due to the same complaints. Explained to him that we really needed to see him to assess what's going on, and maybe determine why he is feeling so poorly. He agrees to being seen, but states he will not agree to treatment until he feels better, and possibly until the COVID19 threat is diminished.  Transferred to the scheduler to reschedule his appointment to a later time tomorrow, and to cancel the infusion appointment.

## 2018-09-28 NOTE — Telephone Encounter (Signed)
Left message for patient to return call.

## 2018-09-29 ENCOUNTER — Inpatient Hospital Stay (HOSPITAL_BASED_OUTPATIENT_CLINIC_OR_DEPARTMENT_OTHER): Payer: Medicare Other | Admitting: Hematology & Oncology

## 2018-09-29 ENCOUNTER — Inpatient Hospital Stay: Payer: Medicare Other

## 2018-09-29 ENCOUNTER — Inpatient Hospital Stay: Payer: Medicare Other | Admitting: Hematology & Oncology

## 2018-09-29 ENCOUNTER — Encounter: Payer: Self-pay | Admitting: Hematology & Oncology

## 2018-09-29 ENCOUNTER — Other Ambulatory Visit: Payer: Self-pay

## 2018-09-29 ENCOUNTER — Inpatient Hospital Stay: Payer: Medicare Other | Attending: Hematology & Oncology

## 2018-09-29 VITALS — BP 186/91 | HR 84 | Temp 98.4°F | Resp 19

## 2018-09-29 DIAGNOSIS — I10 Essential (primary) hypertension: Secondary | ICD-10-CM

## 2018-09-29 DIAGNOSIS — D631 Anemia in chronic kidney disease: Secondary | ICD-10-CM | POA: Diagnosis not present

## 2018-09-29 DIAGNOSIS — N183 Chronic kidney disease, stage 3 (moderate): Secondary | ICD-10-CM | POA: Insufficient documentation

## 2018-09-29 DIAGNOSIS — D630 Anemia in neoplastic disease: Secondary | ICD-10-CM

## 2018-09-29 DIAGNOSIS — D649 Anemia, unspecified: Secondary | ICD-10-CM | POA: Diagnosis not present

## 2018-09-29 DIAGNOSIS — C9002 Multiple myeloma in relapse: Secondary | ICD-10-CM | POA: Diagnosis not present

## 2018-09-29 DIAGNOSIS — D469 Myelodysplastic syndrome, unspecified: Secondary | ICD-10-CM | POA: Insufficient documentation

## 2018-09-29 DIAGNOSIS — Z79899 Other long term (current) drug therapy: Secondary | ICD-10-CM | POA: Diagnosis not present

## 2018-09-29 DIAGNOSIS — Z9484 Stem cells transplant status: Secondary | ICD-10-CM

## 2018-09-29 DIAGNOSIS — Z7982 Long term (current) use of aspirin: Secondary | ICD-10-CM | POA: Insufficient documentation

## 2018-09-29 DIAGNOSIS — C9000 Multiple myeloma not having achieved remission: Secondary | ICD-10-CM | POA: Diagnosis not present

## 2018-09-29 DIAGNOSIS — R609 Edema, unspecified: Secondary | ICD-10-CM | POA: Diagnosis not present

## 2018-09-29 LAB — CMP (CANCER CENTER ONLY)
ALT: 54 U/L — ABNORMAL HIGH (ref 0–44)
AST: 52 U/L — ABNORMAL HIGH (ref 15–41)
Albumin: 3.2 g/dL — ABNORMAL LOW (ref 3.5–5.0)
Alkaline Phosphatase: 81 U/L (ref 38–126)
Anion gap: 8 (ref 5–15)
BUN: 14 mg/dL (ref 8–23)
CO2: 28 mmol/L (ref 22–32)
Calcium: 9 mg/dL (ref 8.9–10.3)
Chloride: 98 mmol/L (ref 98–111)
Creatinine: 0.91 mg/dL (ref 0.61–1.24)
GFR, Est AFR Am: >60 mL/min (ref >60)
GFR, Estimated: >60 mL/min (ref >60)
Glucose, Bld: 107 mg/dL — ABNORMAL HIGH (ref 70–99)
Potassium: 3.9 mmol/L (ref 3.5–5.1)
Sodium: 134 mmol/L — ABNORMAL LOW (ref 135–145)
Total Bilirubin: 0.5 mg/dL (ref 0.3–1.2)
Total Protein: 6.7 g/dL (ref 6.5–8.1)

## 2018-09-29 LAB — CBC WITH DIFFERENTIAL (CANCER CENTER ONLY)
Abs Immature Granulocytes: 0.05 10*3/uL (ref 0.00–0.07)
Basophils Absolute: 0 10*3/uL (ref 0.0–0.1)
Basophils Relative: 0 %
Eosinophils Absolute: 0.1 10*3/uL (ref 0.0–0.5)
Eosinophils Relative: 1 %
HCT: 23.8 % — ABNORMAL LOW (ref 39.0–52.0)
Hemoglobin: 7.3 g/dL — ABNORMAL LOW (ref 13.0–17.0)
Immature Granulocytes: 1 %
Lymphocytes Relative: 9 %
Lymphs Abs: 0.6 10*3/uL — ABNORMAL LOW (ref 0.7–4.0)
MCH: 29.6 pg (ref 26.0–34.0)
MCHC: 30.7 g/dL (ref 30.0–36.0)
MCV: 96.4 fL (ref 80.0–100.0)
Monocytes Absolute: 0.6 10*3/uL (ref 0.1–1.0)
Monocytes Relative: 8 %
Neutro Abs: 6 10*3/uL (ref 1.7–7.7)
Neutrophils Relative %: 81 %
Platelet Count: 318 10*3/uL (ref 150–400)
RBC: 2.47 MIL/uL — ABNORMAL LOW (ref 4.22–5.81)
RDW: 16.8 % — ABNORMAL HIGH (ref 11.5–15.5)
WBC Count: 7.4 10*3/uL (ref 4.0–10.5)
nRBC: 0 % (ref 0.0–0.2)

## 2018-09-29 LAB — SAMPLE TO BLOOD BANK

## 2018-09-29 LAB — PREPARE RBC (CROSSMATCH)

## 2018-09-29 MED ORDER — HEPARIN SOD (PORK) LOCK FLUSH 100 UNIT/ML IV SOLN
500.0000 [IU] | Freq: Once | INTRAVENOUS | Status: AC
  Administered 2018-09-29: 500 [IU] via INTRAVENOUS
  Filled 2018-09-29: qty 5

## 2018-09-29 MED ORDER — SODIUM CHLORIDE 0.9% FLUSH
10.0000 mL | INTRAVENOUS | Status: DC | PRN
  Administered 2018-09-29: 14:00:00 10 mL via INTRAVENOUS
  Filled 2018-09-29: qty 10

## 2018-09-29 MED ORDER — TRAMADOL HCL 50 MG PO TABS: 50.0000 mg | ORAL_TABLET | Freq: Four times a day (QID) | ORAL | 0 refills | Status: DC | PRN

## 2018-09-29 MED ORDER — METOLAZONE 5 MG PO TABS: ORAL_TABLET | ORAL | 5 refills | Status: DC

## 2018-09-29 NOTE — Progress Notes (Signed)
Hematology and Oncology Follow Up Visit  Robert [name] [id number] 16-Nov-1933 85 y.o. 09/29/2018   Principle Diagnosis:  IgG Kappa Myeloma-Relapsed - Trisomy 73, 13q-  Past Therapy: RVD -S/p cycle #3 - revlimid on hold - d/c on 05/12/2018  Current Therapy:   KyCyD- started 06/02/2018 s/p cycle3 - Cytoxan on hold since 08/18/2018 -- d/c due to poor bone marrow tolerance Daratumumab -- start on 09/15/2018 Zometa 4 mg IV q 65months - next dose on 09/2018   Interim History:  Robert [name] is here today for follow-up.  Unfortunately, he is still having a lot of problems with edema.  I think this all has to stem from his uncontrolled high blood pressure.  He admits to not taking his blood pressure medicine at home.  He says that when he checks his blood pressure and his systolic blood pressure is 130, he does not take blood pressure medication.  He has stillmarked edema in his legs.  I think some of this has to do with him having anemia.  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  I am sending off a MDS panel for NGS evaluation.  I really try him on Zaroxolyn (5 mg p.o. daily) to take prior to Lasix.  I wrote down his directions for the Zaroxolyn and Lasix.  I made him a re-these directions to me.  Our phlebotomist was right there to make sure he understood.  It is obvious that his quality of life is miserable because of his edema.  I just have to believe that if we can get his anemia better and get his blood pressure down then things will start to work themselves out.  His last echocardiogram was back in August 2019.  He probably needs to have a new one done.  I thought maybe he could have amyloidosis.  However, his light chains really are not that bad.  His last myeloma studies just showed an M spike of 0.7 g/dL.  His IgG level was 1152 mg/dL.  His kappa light chain was 1.5 mg/dL.  I know that he responded to the Kyprolis/Cytoxan.   However, his bone marrow just could not tolerate this.  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  His erythropoietin level is only 28.  I have not been able to give him Aranesp because his blood pressure has been so high.   Currently, his performance status is ECOG 3.  Medications:  Allergies as of 09/29/2018   No Known Allergies     Medication List       Accurate as of Sep 29, 2018  3:13 PM. If you have any questions, ask your nurse or doctor.        STOP taking these medications   hydrochlorothiazide 25 MG tablet Commonly known as:  HYDRODIURIL Stopped by:  Volanda Napoleon, MD     TAKE these medications   aspirin 81 MG tablet Take 81 mg by mouth daily.   bimatoprost 0.01 % Soln Commonly known as:  LUMIGAN Place 1 drop into both eyes at bedtime.   CALTRATE 600+D PLUS PO Take 1 tablet by mouth daily.   carvedilol 12.5 MG tablet Commonly known as:  COREG Take 1 tablet (12.5 mg total) by mouth 2 (two) times daily with a meal.   cloNIDine 0.1 MG tablet Commonly known as:  CATAPRES Take 1 tablet (0.1 mg total) by mouth 2 (two) times  daily. Take an extra tablet as needed for blood pressure greater than 170/100   [employer] VITAMIN B12 2000 MCG tablet Generic drug:  cyanocobalamin Take 2,000 mcg by mouth at bedtime.   famciclovir 250 MG tablet Commonly known as:  FAMVIR Take 1 tablet (250 mg total) by mouth daily.   furosemide 20 MG tablet Commonly known as:  LASIX Take 2 tablets (40 mg total) by mouth daily.   losartan 100 MG tablet Commonly known as:  COZAAR Take 100 mg by mouth daily.   metolazone 5 MG tablet Commonly known as:  ZAROXOLYN Take 1 pill 1 hour prior to taking Lasix daily Started by:  Volanda Napoleon, MD   montelukast 10 MG tablet Commonly known as:  Singulair Take 1 tablet (10 mg total) by mouth at bedtime.   multivitamin with minerals Tabs tablet Take 1 tablet by mouth daily.   ondansetron 8 MG tablet Commonly  known as:  ZOFRAN Take 1 tablet (8 mg total) by mouth every 8 (eight) hours as needed for nausea or vomiting.   potassium chloride SA 20 MEQ tablet Commonly known as:  K-DUR Take 20 mEq by mouth daily.   pravastatin 20 MG tablet Commonly known as:  PRAVACHOL Take 20 mg by mouth at bedtime.   prochlorperazine 10 MG tablet Commonly known as:  COMPAZINE Take 1 tablet (10 mg total) by mouth every 6 (six) hours as needed for nausea or vomiting.   SYSTANE OP Place 1 drop into both eyes daily as needed (dry eyes).   traMADol 50 MG tablet Commonly known as:  ULTRAM Take 1 tablet (50 mg total) by mouth every 6 (six) hours as needed. Started by:  Volanda Napoleon, MD   Vitamin D3 25 MCG (1000 UT) Caps Take 1,000 Units by mouth daily.   Zometa 4 MG/100ML IVPB Generic drug:  Zoledronic Acid Inject 4 mg into the vein every 4 (four) months. Administered by Dr. Jonette Eva at [facility] - last infusion mid July 2019       Allergies: No Known Allergies  Past Medical History, Surgical history, Social history, and Family History were reviewed and updated.  Review of Systems: Review of Systems  Constitutional: Negative.   HENT: Negative.   Eyes: Negative.   Respiratory: Negative.   Cardiovascular: Positive for leg swelling.  Gastrointestinal: Positive for heartburn.  Genitourinary: Negative.   Musculoskeletal: Positive for joint pain.  Skin: Negative.   Neurological: Negative.   Endo/Heme/Allergies: Negative.   Psychiatric/Behavioral: Negative.      Physical Exam:  oral temperature is 98.4 F (36.9 C). His blood pressure is 186/91 (abnormal) and his pulse is 84. His respiration is 19 and oxygen saturation is 100%.   Wt Readings from Last 3 Encounters:  09/08/18 217 lb (98.4 kg)  09/08/18 217 lb (98.4 kg)  09/01/18 216 lb (98 kg)    Physical Exam Vitals signs reviewed.  HENT:     Head: Normocephalic and atraumatic.  Eyes:     Pupils: Pupils are equal, round,  and reactive to light.  Neck:     Musculoskeletal: Normal range of motion.  Cardiovascular:     Rate and Rhythm: Normal rate and regular rhythm.     Heart sounds: Normal heart sounds.  Pulmonary:     Effort: Pulmonary effort is normal.     Breath sounds: Normal breath sounds.  Abdominal:     General: Bowel sounds are normal.     Palpations: Abdomen is soft.  Comments: Abdominal exam is obese but soft.  He has good bowel sounds.  There is no fluid wave.  There is no palpable liver or spleen tip.  Musculoskeletal: Normal range of motion.        General: No tenderness or deformity.     Comments: Extremities shows 3+ edema in his lower legs and feet.  This is pitting.  He has decent range of motion of his joints.  He has decent strength.  Lymphadenopathy:     Cervical: No cervical adenopathy.  Skin:    General: Skin is warm and dry.     Findings: No erythema or rash.  Neurological:     Mental Status: He is alert and oriented to person, place, and time.  Psychiatric:        Behavior: Behavior normal.        Thought Content: Thought content normal.        Judgment: Judgment normal.      Lab Results  Component Value Date   WBC 7.4 09/29/2018   HGB 7.3 (L) 09/29/2018   HCT 23.8 (L) 09/29/2018   MCV 96.4 09/29/2018   PLT 318 09/29/2018   Lab Results  Component Value Date   FERRITIN 885 (H) 07/28/2018   IRON 71 07/28/2018   TIBC 303 07/28/2018   UIBC 232 07/28/2018   IRONPCTSAT 23 07/28/2018   Lab Results  Component Value Date   RETICCTPCT 4.5 (H) 07/28/2018   RBC 2.47 (L) 09/29/2018   Lab Results  Component Value Date   KPAFRELGTCHN 15.3 08/18/2018   LAMBDASER 6.2 08/18/2018   KAPLAMBRATIO 2.47 (H) 08/18/2018   Lab Results  Component Value Date   IGGSERUM 1,152 08/18/2018   IGA 27 (L) 08/18/2018   IGMSERUM 56 08/18/2018   Lab Results  Component Value Date   TOTALPROTELP 5.9 (L) 08/18/2018   ALBUMINELP 3.3 08/18/2018   A1GS 0.2 08/18/2018   A2GS 0.7  08/18/2018   BETS 0.7 08/18/2018   BETA2SER 0.3 01/03/2015   GAMS 1.1 08/18/2018   MSPIKE 0.7 (H) 08/18/2018   SPEI Comment 07/28/2018     Chemistry      Component Value Date/Time   NA 134 (L) 09/29/2018 1400   NA 137 04/08/2017 1141   NA 138 10/29/2015 1029   K 3.9 09/29/2018 1400   K 3.4 04/08/2017 1141   K 3.9 10/29/2015 1029   CL 98 09/29/2018 1400   CL 102 04/08/2017 1141   CO2 28 09/29/2018 1400   CO2 30 04/08/2017 1141   CO2 27 10/29/2015 1029   BUN 14 09/29/2018 1400   BUN 16 04/08/2017 1141   BUN 13.8 10/29/2015 1029   CREATININE 0.91 09/29/2018 1400   CREATININE 1.3 (H) 04/08/2017 1141   CREATININE 1.1 10/29/2015 1029      Component Value Date/Time   CALCIUM 9.0 09/29/2018 1400   CALCIUM 9.3 04/08/2017 1141   CALCIUM 9.3 10/29/2015 1029   ALKPHOS 81 09/29/2018 1400   ALKPHOS 66 04/08/2017 1141   ALKPHOS 52 10/29/2015 1029   AST 52 (H) 09/29/2018 1400   AST 20 10/29/2015 1029   ALT 54 (H) 09/29/2018 1400   ALT 21 04/08/2017 1141   ALT 14 10/29/2015 1029   BILITOT 0.5 09/29/2018 1400   BILITOT 0.75 10/29/2015 1029       Impression and Plan: Robert [name] is a very pleasant 85 yo African American gentleman with relapsedIgG kappamyeloma.He has a history of stem cell transplant back in 2006.  Again, I truly  believe that there is an element of myelodysplasia with his bone marrow.  I realize he has the myeloma but I also believe that myelodysplasia is in the background that is causing this anemia to be so significant.  He is going had to be transfused again.  I will give him 1 unit of blood.  Hopefully, hopefully, the Zaroxolyn prior to Lasix will help with the edema in his legs.  If we can just get his blood pressure down, he will clearly benefit from Aranesp.  As far as his treatment for myeloma goes with daratumumab, we just might have to put this on "hold" for right now.  We are going to have to watch him weekly.  I just do not see any other way around  this if we are going to try to make any impact on his quality of life.  I spent almost 50 minutes with him today.  This is an incredibly complicated situation and it takes a lot of thought and a lot of effort to get everything prepared and get Robert [name] to understand what to do.  We will get her back here in a week.  Volanda Napoleon, MD 5/7/20203:13 PM

## 2018-09-29 NOTE — Telephone Encounter (Signed)
Left message for patient to return call.

## 2018-09-30 ENCOUNTER — Other Ambulatory Visit: Payer: Self-pay | Admitting: Family

## 2018-09-30 ENCOUNTER — Inpatient Hospital Stay: Payer: Medicare Other

## 2018-09-30 DIAGNOSIS — N183 Chronic kidney disease, stage 3 (moderate): Secondary | ICD-10-CM | POA: Diagnosis not present

## 2018-09-30 DIAGNOSIS — C9002 Multiple myeloma in relapse: Secondary | ICD-10-CM

## 2018-09-30 DIAGNOSIS — D630 Anemia in neoplastic disease: Secondary | ICD-10-CM

## 2018-09-30 DIAGNOSIS — Z9484 Stem cells transplant status: Secondary | ICD-10-CM | POA: Diagnosis not present

## 2018-09-30 DIAGNOSIS — Z79899 Other long term (current) drug therapy: Secondary | ICD-10-CM | POA: Diagnosis not present

## 2018-09-30 DIAGNOSIS — D469 Myelodysplastic syndrome, unspecified: Secondary | ICD-10-CM | POA: Diagnosis not present

## 2018-09-30 DIAGNOSIS — D631 Anemia in chronic kidney disease: Secondary | ICD-10-CM | POA: Diagnosis not present

## 2018-09-30 LAB — IGG, IGA, IGM
IgA: 90 mg/dL (ref 61–437)
IgG (Immunoglobin G), Serum: 1537 mg/dL (ref 603–1613)
IgM (Immunoglobulin M), Srm: 50 mg/dL (ref 15–143)

## 2018-09-30 LAB — KAPPA/LAMBDA LIGHT CHAINS
Kappa free light chain: 51.6 mg/L — ABNORMAL HIGH (ref 3.3–19.4)
Kappa, lambda light chain ratio: 1.51 (ref 0.26–1.65)
Lambda free light chains: 34.1 mg/L — ABNORMAL HIGH (ref 5.7–26.3)

## 2018-09-30 MED ORDER — DIPHENHYDRAMINE HCL 25 MG PO CAPS
ORAL_CAPSULE | ORAL | Status: AC
  Filled 2018-09-30: qty 1

## 2018-09-30 MED ORDER — FUROSEMIDE 10 MG/ML IJ SOLN: 40.0000 mg | Freq: Once | INTRAMUSCULAR | Status: DC

## 2018-09-30 MED ORDER — ACETAMINOPHEN 325 MG PO TABS
650.0000 mg | ORAL_TABLET | Freq: Once | ORAL | Status: AC
  Administered 2018-09-30: 650 mg via ORAL

## 2018-09-30 MED ORDER — DIPHENHYDRAMINE HCL 25 MG PO CAPS
25.0000 mg | ORAL_CAPSULE | Freq: Once | ORAL | Status: AC
  Administered 2018-09-30: 25 mg via ORAL

## 2018-09-30 MED ORDER — FUROSEMIDE 10 MG/ML IJ SOLN: 20.0000 mg | Freq: Once | INTRAMUSCULAR | Status: AC

## 2018-09-30 MED ORDER — ACETAMINOPHEN 325 MG PO TABS
ORAL_TABLET | ORAL | Status: AC
  Filled 2018-09-30: qty 2

## 2018-09-30 MED ORDER — FUROSEMIDE 10 MG/ML IJ SOLN
INTRAMUSCULAR | Status: AC
  Filled 2018-09-30: qty 4

## 2018-09-30 NOTE — Progress Notes (Signed)
Upon accessing pt's PAC, noticed EKG electrodes to pt's chest. Pt reports EMS was at his home this am because "my eyes were flickering." Pt states they wrote something down for Dr. Marin Olp. Checked with MD who has rhythm strip from EMS, along with vitals obtained, CBG & note stating pt was unconscious x 20/30 seconds.  Per Dr Marin Olp, continue with 1 unit PRBC as ordered. Will address IV Lasix based on vitals post-transfusion. dph  42 - Per VOV Dr Marin Olp, give Lasix 40mg  IV now & reassess vitals in 30 minutes. dph

## 2018-09-30 NOTE — Patient Instructions (Signed)

## 2018-10-01 LAB — TYPE AND SCREEN
ABO/RH(D): AB POS
Antibody Screen: NEGATIVE
Donor AG Type: NEGATIVE
Unit division: 0

## 2018-10-01 LAB — BPAM RBC
Blood Product Expiration Date: 202006012359
ISSUE DATE / TIME: 202005080732
Unit Type and Rh: 9500

## 2018-10-03 LAB — IMMUNOFIXATION REFLEX, SERUM
IgA: 45 mg/dL — ABNORMAL LOW (ref 61–437)
IgG (Immunoglobin G), Serum: 783 mg/dL (ref 603–1613)
IgM (Immunoglobulin M), Srm: 26 mg/dL (ref 15–143)

## 2018-10-03 LAB — PROTEIN ELECTROPHORESIS, SERUM, WITH REFLEX
A/G Ratio: 0.8 (ref 0.7–1.7)
Albumin ELP: 1.1 g/dL — ABNORMAL LOW (ref 2.9–4.4)
Alpha-1-Globulin: 0.2 g/dL (ref 0.0–0.4)
Alpha-2-Globulin: 0.4 g/dL (ref 0.4–1.0)
Beta Globulin: 0.3 g/dL — ABNORMAL LOW (ref 0.7–1.3)
Gamma Globulin: 0.4 g/dL (ref 0.4–1.8)
Globulin, Total: 1.3 g/dL — ABNORMAL LOW (ref 2.2–3.9)
M-Spike, %: 0.2 g/dL — ABNORMAL HIGH
SPEP Interpretation: 0
Total Protein ELP: 2.4 g/dL — ABNORMAL LOW (ref 6.0–8.5)

## 2018-10-04 NOTE — Telephone Encounter (Signed)
Left another message for patient to return call.

## 2018-10-05 ENCOUNTER — Telehealth (HOSPITAL_COMMUNITY): Payer: Self-pay | Admitting: *Deleted

## 2018-10-05 NOTE — Telephone Encounter (Signed)
Left another message for patient to return call.

## 2018-10-05 NOTE — Telephone Encounter (Signed)
Left message for patient to call office.  

## 2018-10-06 ENCOUNTER — Encounter: Payer: Self-pay | Admitting: Family

## 2018-10-06 ENCOUNTER — Ambulatory Visit: Payer: TRICARE For Life (TFL)

## 2018-10-06 ENCOUNTER — Other Ambulatory Visit: Payer: TRICARE For Life (TFL)

## 2018-10-06 ENCOUNTER — Telehealth (HOSPITAL_COMMUNITY): Payer: Self-pay | Admitting: Radiology

## 2018-10-06 ENCOUNTER — Inpatient Hospital Stay: Payer: Medicare Other

## 2018-10-06 ENCOUNTER — Other Ambulatory Visit: Payer: Self-pay

## 2018-10-06 ENCOUNTER — Inpatient Hospital Stay (HOSPITAL_BASED_OUTPATIENT_CLINIC_OR_DEPARTMENT_OTHER): Payer: Medicare Other | Admitting: Family

## 2018-10-06 VITALS — Wt 212.4 lb

## 2018-10-06 DIAGNOSIS — D469 Myelodysplastic syndrome, unspecified: Secondary | ICD-10-CM

## 2018-10-06 DIAGNOSIS — N183 Chronic kidney disease, stage 3 unspecified: Secondary | ICD-10-CM | POA: Insufficient documentation

## 2018-10-06 DIAGNOSIS — D508 Other iron deficiency anemias: Secondary | ICD-10-CM

## 2018-10-06 DIAGNOSIS — Z79899 Other long term (current) drug therapy: Secondary | ICD-10-CM

## 2018-10-06 DIAGNOSIS — D500 Iron deficiency anemia secondary to blood loss (chronic): Secondary | ICD-10-CM

## 2018-10-06 DIAGNOSIS — D631 Anemia in chronic kidney disease: Secondary | ICD-10-CM | POA: Diagnosis not present

## 2018-10-06 DIAGNOSIS — D649 Anemia, unspecified: Secondary | ICD-10-CM | POA: Diagnosis not present

## 2018-10-06 DIAGNOSIS — Z95828 Presence of other vascular implants and grafts: Secondary | ICD-10-CM

## 2018-10-06 DIAGNOSIS — Z9484 Stem cells transplant status: Secondary | ICD-10-CM

## 2018-10-06 DIAGNOSIS — C9002 Multiple myeloma in relapse: Secondary | ICD-10-CM | POA: Diagnosis not present

## 2018-10-06 DIAGNOSIS — D630 Anemia in neoplastic disease: Secondary | ICD-10-CM

## 2018-10-06 DIAGNOSIS — M899 Disorder of bone, unspecified: Secondary | ICD-10-CM

## 2018-10-06 DIAGNOSIS — C9000 Multiple myeloma not having achieved remission: Secondary | ICD-10-CM

## 2018-10-06 HISTORY — DX: Chronic kidney disease, stage 3 unspecified: N18.30

## 2018-10-06 HISTORY — DX: Chronic kidney disease, stage 3 unspecified: D63.1

## 2018-10-06 LAB — CBC WITH DIFFERENTIAL (CANCER CENTER ONLY)
Abs Immature Granulocytes: 0.05 10*3/uL (ref 0.00–0.07)
Basophils Absolute: 0 10*3/uL (ref 0.0–0.1)
Basophils Relative: 1 %
Eosinophils Absolute: 0.1 10*3/uL (ref 0.0–0.5)
Eosinophils Relative: 1 %
HCT: 23.8 % — ABNORMAL LOW (ref 39.0–52.0)
Hemoglobin: 7.5 g/dL — ABNORMAL LOW (ref 13.0–17.0)
Immature Granulocytes: 1 %
Lymphocytes Relative: 13 %
Lymphs Abs: 0.8 10*3/uL (ref 0.7–4.0)
MCH: 29.1 pg (ref 26.0–34.0)
MCHC: 31.5 g/dL (ref 30.0–36.0)
MCV: 92.2 fL (ref 80.0–100.0)
Monocytes Absolute: 0.7 10*3/uL (ref 0.1–1.0)
Monocytes Relative: 10 %
Neutro Abs: 4.9 10*3/uL (ref 1.7–7.7)
Neutrophils Relative %: 74 %
Platelet Count: 333 10*3/uL (ref 150–400)
RBC: 2.58 MIL/uL — ABNORMAL LOW (ref 4.22–5.81)
RDW: 18.4 % — ABNORMAL HIGH (ref 11.5–15.5)
WBC Count: 6.5 10*3/uL (ref 4.0–10.5)
nRBC: 0 % (ref 0.0–0.2)

## 2018-10-06 LAB — CMP (CANCER CENTER ONLY)
ALT: 27 U/L (ref 0–44)
AST: 24 U/L (ref 15–41)
Albumin: 3.3 g/dL — ABNORMAL LOW (ref 3.5–5.0)
Alkaline Phosphatase: 70 U/L (ref 38–126)
Anion gap: 7 (ref 5–15)
BUN: 22 mg/dL (ref 8–23)
CO2: 30 mmol/L (ref 22–32)
Calcium: 9.2 mg/dL (ref 8.9–10.3)
Chloride: 91 mmol/L — ABNORMAL LOW (ref 98–111)
Creatinine: 0.93 mg/dL (ref 0.61–1.24)
GFR, Est AFR Am: >60 mL/min (ref >60)
GFR, Estimated: >60 mL/min (ref >60)
Glucose, Bld: 96 mg/dL (ref 70–99)
Potassium: 3.3 mmol/L — ABNORMAL LOW (ref 3.5–5.1)
Sodium: 128 mmol/L — ABNORMAL LOW (ref 135–145)
Total Bilirubin: 0.6 mg/dL (ref 0.3–1.2)
Total Protein: 6.8 g/dL (ref 6.5–8.1)

## 2018-10-06 LAB — OCCULT BLOOD X 1 CARD TO LAB, STOOL
Fecal Occult Bld: NEGATIVE
Fecal Occult Bld: NEGATIVE
Fecal Occult Bld: NEGATIVE

## 2018-10-06 LAB — RETICULOCYTES
Immature Retic Fract: 16 % — ABNORMAL HIGH (ref 2.3–15.9)
RBC.: 2.6 MIL/uL — ABNORMAL LOW (ref 4.22–5.81)
Retic Count, Absolute: 44.2 10*3/uL (ref 19.0–186.0)
Retic Ct Pct: 1.7 % (ref 0.4–3.1)

## 2018-10-06 MED ORDER — HEPARIN SOD (PORK) LOCK FLUSH 100 UNIT/ML IV SOLN
500.0000 [IU] | Freq: Once | INTRAVENOUS | Status: DC
  Filled 2018-10-06: qty 5

## 2018-10-06 MED ORDER — SODIUM CHLORIDE 0.9% FLUSH
10.0000 mL | Freq: Once | INTRAVENOUS | Status: DC
  Filled 2018-10-06: qty 10

## 2018-10-06 MED ORDER — HEPARIN SOD (PORK) LOCK FLUSH 100 UNIT/ML IV SOLN
500.0000 [IU] | Freq: Once | INTRAVENOUS | Status: AC | PRN
  Administered 2018-10-06: 500 [IU]
  Filled 2018-10-06: qty 5

## 2018-10-06 MED ORDER — DARBEPOETIN ALFA 300 MCG/0.6ML IJ SOSY
PREFILLED_SYRINGE | INTRAMUSCULAR | Status: AC
  Filled 2018-10-06: qty 0.6

## 2018-10-06 MED ORDER — DARBEPOETIN ALFA 300 MCG/0.6ML IJ SOSY
300.0000 ug | PREFILLED_SYRINGE | Freq: Once | INTRAMUSCULAR | Status: AC
  Administered 2018-10-06: 300 ug via SUBCUTANEOUS

## 2018-10-06 MED ORDER — SODIUM CHLORIDE 0.9% FLUSH
10.0000 mL | Freq: Once | INTRAVENOUS | Status: AC | PRN
  Administered 2018-10-06: 10 mL
  Filled 2018-10-06: qty 10

## 2018-10-06 NOTE — Patient Instructions (Signed)

## 2018-10-06 NOTE — Telephone Encounter (Signed)
COVID-19 Pre-Screening Questions:  . Do you currently have a fever? No 96.9 (yes = cancel and refer to pcp for e-visit) . Have you recently travelled on a cruise, internationally, or to [place], [place], [place], [place], [place], or [place], [place] [employer]) ? no (yes = cancel, stay home, monitor symptoms, and contact pcp or initiate e-visit if symptoms develop) . Have you been in contact with someone that is currently pending confirmation of Covid19 testing or has been confirmed to have the [place] virus?  no (yes = cancel, stay home, away from tested individual, monitor symptoms, and contact pcp or initiate e-visit if symptoms develop) . Are you currently experiencing fatigue or cough? no (yes = pt should be prepared to have a mask placed at the time of their visit). . Reiterated no additional visitors. Robert [name] no earlier than 15 minutes before appointment time. . Please bring own mask.

## 2018-10-06 NOTE — Patient Instructions (Signed)

## 2018-10-07 ENCOUNTER — Ambulatory Visit (HOSPITAL_COMMUNITY): Payer: Medicare Other | Attending: Hematology & Oncology

## 2018-10-07 ENCOUNTER — Other Ambulatory Visit: Payer: Self-pay | Admitting: Hematology & Oncology

## 2018-10-07 ENCOUNTER — Encounter: Payer: Self-pay | Admitting: Hematology & Oncology

## 2018-10-07 DIAGNOSIS — I10 Essential (primary) hypertension: Secondary | ICD-10-CM | POA: Insufficient documentation

## 2018-10-07 DIAGNOSIS — I351 Nonrheumatic aortic (valve) insufficiency: Secondary | ICD-10-CM | POA: Insufficient documentation

## 2018-10-07 DIAGNOSIS — I421 Obstructive hypertrophic cardiomyopathy: Secondary | ICD-10-CM | POA: Diagnosis not present

## 2018-10-07 DIAGNOSIS — D630 Anemia in neoplastic disease: Secondary | ICD-10-CM | POA: Insufficient documentation

## 2018-10-07 DIAGNOSIS — D509 Iron deficiency anemia, unspecified: Secondary | ICD-10-CM | POA: Insufficient documentation

## 2018-10-07 DIAGNOSIS — K909 Intestinal malabsorption, unspecified: Secondary | ICD-10-CM | POA: Insufficient documentation

## 2018-10-07 DIAGNOSIS — I429 Cardiomyopathy, unspecified: Secondary | ICD-10-CM | POA: Insufficient documentation

## 2018-10-07 DIAGNOSIS — D508 Other iron deficiency anemias: Secondary | ICD-10-CM

## 2018-10-07 HISTORY — DX: Iron deficiency anemia, unspecified: D50.9

## 2018-10-07 HISTORY — DX: Intestinal malabsorption, unspecified: K90.9

## 2018-10-07 LAB — IRON AND TIBC
Iron: 33 ug/dL — ABNORMAL LOW (ref 42–163)
Saturation Ratios: 17 % — ABNORMAL LOW (ref 20–55)
TIBC: 196 ug/dL — ABNORMAL LOW (ref 202–409)
UIBC: 163 ug/dL (ref 117–376)

## 2018-10-07 LAB — FERRITIN: Ferritin: 2374 ng/mL — ABNORMAL HIGH (ref 24–336)

## 2018-10-10 ENCOUNTER — Telehealth: Payer: Self-pay | Admitting: Cardiology

## 2018-10-10 ENCOUNTER — Other Ambulatory Visit: Payer: Self-pay | Admitting: Hematology & Oncology

## 2018-10-10 NOTE — Telephone Encounter (Signed)
Virtual Visit Pre-Appointment Phone Call  "(Name), I am calling you today to discuss your upcoming appointment. We are currently trying to limit exposure to the virus that causes COVID-19 by seeing patients at home rather than in the office."  1. "What is the BEST phone number to call the day of the visit?" - include this in appointment notes  2. Do you have or have access to (through a family member/friend) a smartphone with video capability that we can use for your visit?" a. If yes - list this number in appt notes as cell (if different from BEST phone #) and list the appointment type as a VIDEO visit in appointment notes b. If no - list the appointment type as a PHONE visit in appointment notes  3. Confirm consent - "In the setting of the current Covid19 crisis, you are scheduled for a (phone or video) visit with your provider on (date) at (time).  Just as we do with many in-office visits, in order for you to participate in this visit, we must obtain consent.  If you'd like, I can send this to your mychart (if signed up) or email for you to review.  Otherwise, I can obtain your verbal consent now.  All virtual visits are billed to your insurance company just like a normal visit would be.  By agreeing to a virtual visit, we'd like you to understand that the technology does not allow for your provider to perform an examination, and thus may limit your provider's ability to fully assess your condition. If your provider identifies any concerns that need to be evaluated in person, we will make arrangements to do so.  Finally, though the technology is pretty good, we cannot assure that it will always work on either your or our end, and in the setting of a video visit, we may have to convert it to a phone-only visit.  In either situation, we cannot ensure that we have a secure connection.  Are you willing to proceed?" STAFF: Did the patient verbally acknowledge consent to telehealth visit? Document  YES/NO here: YES  4. Advise patient to be prepared - "Two hours prior to your appointment, go ahead and check your blood pressure, pulse, oxygen saturation, and your weight (if you have the equipment to check those) and write them all down. When your visit starts, your provider will ask you for this information. If you have an Apple Watch or Kardia device, please plan to have heart rate information ready on the day of your appointment. Please have a pen and paper handy nearby the day of the visit as well."  5. Give patient instructions for MyChart download to smartphone OR [email or web address] as below if video visit (depending on what platform provider is using)  6. Inform patient they will receive a phone call 15 minutes prior to their appointment time (may be from unknown caller ID) so they should be prepared to answer    TELEPHONE CALL NOTE  Robert [name] has been deemed a candidate for a follow-up tele-health visit to limit community exposure during the Covid-19 pandemic. I spoke with the patient via phone to ensure availability of phone/video source, confirm preferred email & phone number, and discuss instructions and expectations.  I reminded Robert [name] to be prepared with any vital sign and/or heart rhythm information that could potentially be obtained via home monitoring, at the time of his visit. I reminded Robert [name] to expect a phone call prior to  his visit.  Isaiah Blakes 10/10/2018 10:15 AM   INSTRUCTIONS FOR DOWNLOADING THE MYCHART APP TO SMARTPHONE  - The patient must first make sure to have activated MyChart and know their login information - If Apple, go to [employer] and type in MyChart in the search bar and download the app. If Android, ask patient to go to [employer] and type in [place] in the search bar and download the app. The app is free but as with any other app downloads, their phone may require them to verify saved payment information or Apple/Android  password.  - The patient will need to then log into the app with their MyChart username and password, and select [facility] as their healthcare provider to link the account. When it is time for your visit, go to the MyChart app, find appointments, and click Begin Video Visit. Be sure to Select Allow for your device to access the Microphone and Camera for your visit. You will then be connected, and your provider will be with you shortly.  **If they have any issues connecting, or need assistance please contact MyChart service desk [phone])**  **If using a computer, in order to ensure the best quality for their visit they will need to use either of the following Internet Browsers: [employer], or Google Chrome**  IF USING DOXIMITY or DOXY.ME - The patient will receive a link just prior to their visit by text.     FULL LENGTH CONSENT FOR TELE-HEALTH VISIT   I hereby voluntarily request, consent and authorize [facility] and its employed or contracted physicians, physician assistants, nurse practitioners or other licensed health care professionals (the Practitioner), to provide me with telemedicine health care services (the Services") as deemed necessary by the treating Practitioner. I acknowledge and consent to receive the Services by the Practitioner via telemedicine. I understand that the telemedicine visit will involve communicating with the Practitioner through live audiovisual communication technology and the disclosure of certain medical information by electronic transmission. I acknowledge that I have been given the opportunity to request an in-person assessment or other available alternative prior to the telemedicine visit and am voluntarily participating in the telemedicine visit.  I understand that I have the right to withhold or withdraw my consent to the use of telemedicine in the course of my care at any time, without affecting my right to future care or treatment,  and that the Practitioner or I may terminate the telemedicine visit at any time. I understand that I have the right to inspect all information obtained and/or recorded in the course of the telemedicine visit and may receive copies of available information for a reasonable fee.  I understand that some of the potential risks of receiving the Services via telemedicine include:   Delay or interruption in medical evaluation due to technological equipment failure or disruption;  Information transmitted may not be sufficient (e.g. poor resolution of images) to allow for appropriate medical decision making by the Practitioner; and/or   In rare instances, security protocols could fail, causing a breach of personal health information.  Furthermore, I acknowledge that it is my responsibility to provide information about my medical history, conditions and care that is complete and accurate to the best of my ability. I acknowledge that Practitioner's advice, recommendations, and/or decision may be based on factors not within their control, such as incomplete or inaccurate data provided by me or distortions of diagnostic images or specimens that may result from electronic transmissions. I  understand that the practice of medicine is not an exact science and that Practitioner makes no warranties or guarantees regarding treatment outcomes. I acknowledge that I will receive a copy of this consent concurrently upon execution via email to the email address I last provided but may also request a printed copy by calling the office of [facility].    I understand that my insurance will be billed for this visit.   I have read or had this consent read to me.  I understand the contents of this consent, which adequately explains the benefits and risks of the Services being provided via telemedicine.   I have been provided ample opportunity to ask questions regarding this consent and the Services and have had my questions  answered to my satisfaction.  I give my informed consent for the services to be provided through the use of telemedicine in my medical care  By participating in this telemedicine visit I agree to the above.

## 2018-10-10 NOTE — Progress Notes (Signed)
Hematology and Oncology Follow Up Visit  Robert [name] [id number] August 13, 1933 85 y.o. 10/10/2018   Principle Diagnosis:  IgG Kappa Myeloma-Relapsed - Trisomy 68, 13q-  Past Therapy: RVD -S/p cycle #3 - revlimid on hold - d/c on 05/12/2018 KyCyD- started 06/02/2018 s/p cycle3 - Cytoxan on hold since 08/18/2018 -- d/c due to poor bone marrow tolerance  Current Therapy:   Daratumumab -- not yet started Zometa 4 mg IV q 75months - next dose on5/2020   Interim History:  Robert [name] is here today for follow-up. Despite having received blood last week his Hgb is only 7.5, MCV 92.  Last week his M-spike was down to 0.2, IgG level 1537 mg/dL and kappa light chains 5.16 mg/dL. MDS panel is still pending.  He is symptomatic with fatigue, weakness and pitting edema in the lower extremities/feet.   He verbalized that he is taking his lasix and zaroxolyn as prescribed.  He has not noted any episodes of bleeding. No bruising or petechiae.  His last colonoscopy was in 2016 with Dr. Doretha Sou. This only showed internal hemorrhoids.  Hemoccult stool checked today with Robert Kidney, RN chaperone. This was negative.  No fever, chills, n/v, cough, rash, dizziness, SOB, chest pain, palpitations, abdominal pain or changes in bowel or bladder habits.  He will take a laxative when needed for constipation.  No tenderness, numbness or tingling in her extremities.  No lymphadenopathy noted on exam.  She has maintained a good appetite and is shydrating. His weight is stable.   ECOG Performance Status: 1 - Symptomatic but completely ambulatory  Medications:  Allergies as of 10/06/2018   No Known Allergies     Medication List       Accurate as of Oct 06, 2018 11:59 PM. If you have any questions, ask your nurse or doctor.        aspirin 81 MG tablet Take 81 mg by mouth daily.   bimatoprost 0.01 % Soln Commonly known as:  LUMIGAN Place 1 drop into both eyes at bedtime.   CALTRATE 600+D PLUS PO Take 1  tablet by mouth daily.   carvedilol 12.5 MG tablet Commonly known as:  COREG Take 1 tablet (12.5 mg total) by mouth 2 (two) times daily with a meal.   cloNIDine 0.1 MG tablet Commonly known as:  CATAPRES Take 1 tablet (0.1 mg total) by mouth 2 (two) times daily. Take an extra tablet as needed for blood pressure greater than 170/100   [employer] VITAMIN B12 2000 MCG tablet Generic drug:  cyanocobalamin Take 2,000 mcg by mouth at bedtime.   famciclovir 250 MG tablet Commonly known as:  FAMVIR Take 1 tablet (250 mg total) by mouth daily.   furosemide 20 MG tablet Commonly known as:  LASIX Take 2 tablets (40 mg total) by mouth daily.   losartan 100 MG tablet Commonly known as:  COZAAR Take 100 mg by mouth daily.   metolazone 5 MG tablet Commonly known as:  ZAROXOLYN Take 1 pill 1 hour prior to taking Lasix daily   montelukast 10 MG tablet Commonly known as:  Singulair Take 1 tablet (10 mg total) by mouth at bedtime.   multivitamin with minerals Tabs tablet Take 1 tablet by mouth daily.   ondansetron 8 MG tablet Commonly known as:  ZOFRAN Take 1 tablet (8 mg total) by mouth every 8 (eight) hours as needed for nausea or vomiting.   potassium chloride SA 20 MEQ tablet Commonly known as:  K-DUR Take 20 mEq by mouth  daily.   pravastatin 20 MG tablet Commonly known as:  PRAVACHOL Take 20 mg by mouth at bedtime.   prochlorperazine 10 MG tablet Commonly known as:  COMPAZINE Take 1 tablet (10 mg total) by mouth every 6 (six) hours as needed for nausea or vomiting.   SYSTANE OP Place 1 drop into both eyes daily as needed (dry eyes).   traMADol 50 MG tablet Commonly known as:  ULTRAM Take 1 tablet (50 mg total) by mouth every 6 (six) hours as needed.   Vitamin D3 25 MCG (1000 UT) Caps Take 1,000 Units by mouth daily.   Zometa 4 MG/100ML IVPB Generic drug:  Zoledronic Acid Inject 4 mg into the vein every 4 (four) months. Administered by Dr. Jonette Eva at [facility]  - last infusion mid July 2019       Allergies: No Known Allergies  Past Medical History, Surgical history, Social history, and Family History were reviewed and updated.  Review of Systems: All other 10 point review of systems is negative.   Physical Exam:  weight is 212 lb 7 oz (96.4 kg).   Wt Readings from Last 3 Encounters:  10/06/18 212 lb 7 oz (96.4 kg)  09/08/18 217 lb (98.4 kg)  09/08/18 217 lb (98.4 kg)    Ocular: Sclerae unicteric, pupils equal, round and reactive to light Ear-nose-throat: Oropharynx clear, dentition fair Lymphatic: No cervical or supraclavicular adenopathy Lungs no rales or rhonchi, good excursion bilaterally Heart regular rate and rhythm, no murmur appreciated Abd soft, nontender, positive bowel sounds, no liver or spleen tip palpated on exam.  MSK no focal spinal tenderness, no joint edema Neuro: non-focal, well-oriented, appropriate affect Breasts: Deferred   Lab Results  Component Value Date   WBC 6.5 10/06/2018   HGB 7.5 (L) 10/06/2018   HCT 23.8 (L) 10/06/2018   MCV 92.2 10/06/2018   PLT 333 10/06/2018   Lab Results  Component Value Date   FERRITIN 2,374 (H) 10/06/2018   IRON 33 (L) 10/06/2018   TIBC 196 (L) 10/06/2018   UIBC 163 10/06/2018   IRONPCTSAT 17 (L) 10/06/2018   Lab Results  Component Value Date   RETICCTPCT 1.7 10/06/2018   RBC 2.58 (L) 10/06/2018   RBC 2.60 (L) 10/06/2018   Lab Results  Component Value Date   KPAFRELGTCHN 51.6 (H) 09/29/2018   LAMBDASER 34.1 (H) 09/29/2018   KAPLAMBRATIO 1.51 09/29/2018   Lab Results  Component Value Date   IGGSERUM 1,537 09/29/2018   IGGSERUM 783 09/29/2018   IGA 90 09/29/2018   IGA 45 (L) 09/29/2018   IGMSERUM 50 09/29/2018   IGMSERUM 26 09/29/2018   Lab Results  Component Value Date   TOTALPROTELP 2.4 (L) 09/29/2018   ALBUMINELP 1.1 (L) 09/29/2018   A1GS 0.2 09/29/2018   A2GS 0.4 09/29/2018   BETS 0.3 (L) 09/29/2018   BETA2SER 0.3 01/03/2015   GAMS 0.4  09/29/2018   MSPIKE 0.2 (H) 09/29/2018   SPEI Comment 07/28/2018     Chemistry      Component Value Date/Time   NA 128 (L) 10/06/2018 1140   NA 137 04/08/2017 1141   NA 138 10/29/2015 1029   K 3.3 (L) 10/06/2018 1140   K 3.4 04/08/2017 1141   K 3.9 10/29/2015 1029   CL 91 (L) 10/06/2018 1140   CL 102 04/08/2017 1141   CO2 30 10/06/2018 1140   CO2 30 04/08/2017 1141   CO2 27 10/29/2015 1029   BUN 22 10/06/2018 1140   BUN 16  04/08/2017 1141   BUN 13.8 10/29/2015 1029   CREATININE 0.93 10/06/2018 1140   CREATININE 1.3 (H) 04/08/2017 1141   CREATININE 1.1 10/29/2015 1029      Component Value Date/Time   CALCIUM 9.2 10/06/2018 1140   CALCIUM 9.3 04/08/2017 1141   CALCIUM 9.3 10/29/2015 1029   ALKPHOS 70 10/06/2018 1140   ALKPHOS 66 04/08/2017 1141   ALKPHOS 52 10/29/2015 1029   AST 24 10/06/2018 1140   AST 20 10/29/2015 1029   ALT 27 10/06/2018 1140   ALT 21 04/08/2017 1141   ALT 14 10/29/2015 1029   BILITOT 0.6 10/06/2018 1140   BILITOT 0.75 10/29/2015 1029       Impression and Plan: Mr. Lenderman is a very pleasant 85 yo African American gentleman withrelapsedIgG kappamyeloma.He has a history ofstem cell transplant back in 2006.  He now appears to have a myelodysplasia. MDS panel is still pending.  Despite receiving blood, his counts are not improving. His stool was negative for blood.  I spoke with Dr. Marin Olp regarding his persistent anemia. He received Aranesp today for the first time. Hopefully this will work for him.  We will see what his iron studies show and bring him back in for IV iron next week if needed.  We will see him back in another 2 weeks.  He promises to contact our office with any questions or concerns. We can certainly see him sooner if need be.   Laverna Peace, NP 5/18/20208:19 AM

## 2018-10-10 NOTE — Telephone Encounter (Signed)
Patient informed to advise with pcp regarding swelling per Dr. Agustin Cree. Patient verbally understands.

## 2018-10-12 ENCOUNTER — Inpatient Hospital Stay: Payer: Medicare Other

## 2018-10-12 ENCOUNTER — Other Ambulatory Visit: Payer: Self-pay

## 2018-10-12 VITALS — BP 174/82 | HR 74 | Temp 98.7°F | Resp 18

## 2018-10-12 DIAGNOSIS — Z79899 Other long term (current) drug therapy: Secondary | ICD-10-CM | POA: Diagnosis not present

## 2018-10-12 DIAGNOSIS — M898X9 Other specified disorders of bone, unspecified site: Secondary | ICD-10-CM

## 2018-10-12 DIAGNOSIS — D631 Anemia in chronic kidney disease: Secondary | ICD-10-CM | POA: Diagnosis not present

## 2018-10-12 DIAGNOSIS — C9000 Multiple myeloma not having achieved remission: Secondary | ICD-10-CM

## 2018-10-12 DIAGNOSIS — N183 Chronic kidney disease, stage 3 (moderate): Secondary | ICD-10-CM | POA: Diagnosis not present

## 2018-10-12 DIAGNOSIS — Z9484 Stem cells transplant status: Secondary | ICD-10-CM | POA: Diagnosis not present

## 2018-10-12 DIAGNOSIS — D469 Myelodysplastic syndrome, unspecified: Secondary | ICD-10-CM | POA: Diagnosis not present

## 2018-10-12 DIAGNOSIS — C9002 Multiple myeloma in relapse: Secondary | ICD-10-CM | POA: Diagnosis not present

## 2018-10-12 DIAGNOSIS — M899 Disorder of bone, unspecified: Secondary | ICD-10-CM

## 2018-10-12 LAB — MYELODYSPLASTIC SYNDROME (MDS) PANEL

## 2018-10-12 MED ORDER — HEPARIN SOD (PORK) LOCK FLUSH 100 UNIT/ML IV SOLN
500.0000 [IU] | Freq: Once | INTRAVENOUS | Status: AC | PRN
  Administered 2018-10-12: 500 [IU]
  Filled 2018-10-12: qty 5

## 2018-10-12 MED ORDER — ZOLEDRONIC ACID 4 MG/100ML IV SOLN
4.0000 mg | Freq: Once | INTRAVENOUS | Status: AC
  Administered 2018-10-12: 10:00:00 4 mg via INTRAVENOUS
  Filled 2018-10-12: qty 100

## 2018-10-12 MED ORDER — SODIUM CHLORIDE 0.9 % IV SOLN
Freq: Once | INTRAVENOUS | Status: AC
  Administered 2018-10-12: 10:00:00 via INTRAVENOUS
  Filled 2018-10-12: qty 250

## 2018-10-12 MED ORDER — SODIUM CHLORIDE 0.9 % IV SOLN
750.0000 mg | Freq: Once | INTRAVENOUS | Status: AC
  Administered 2018-10-12: 10:00:00 750 mg via INTRAVENOUS
  Filled 2018-10-12: qty 15

## 2018-10-12 MED ORDER — SODIUM CHLORIDE 0.9% FLUSH
10.0000 mL | Freq: Once | INTRAVENOUS | Status: AC | PRN
  Administered 2018-10-12: 10 mL
  Filled 2018-10-12: qty 10

## 2018-10-12 NOTE — Patient Instructions (Signed)
Zoledronic Acid injection (Hypercalcemia, Oncology) What is this medicine? ZOLEDRONIC ACID (ZOE le dron ik AS id) lowers the amount of calcium loss from bone. It is used to treat too much calcium in your blood from cancer. It is also used to prevent complications of cancer that has spread to the bone. This medicine may be used for other purposes; ask your health care provider or pharmacist if you have questions. COMMON BRAND NAME(S): Zometa What should I tell my health care provider before I take this medicine? They need to know if you have any of these conditions: -aspirin-sensitive asthma -cancer, especially if you are receiving medicines used to treat cancer -dental disease or wear dentures -infection -kidney disease -receiving corticosteroids like dexamethasone or prednisone -an unusual or allergic reaction to zoledronic acid, other medicines, foods, dyes, or preservatives -pregnant or trying to get pregnant -breast-feeding How should I use this medicine? This medicine is for infusion into a vein. It is given by a health care professional in a hospital or clinic setting. Talk to your pediatrician regarding the use of this medicine in children. Special care may be needed. Overdosage: If you think you have taken too much of this medicine contact a poison control center or emergency room at once. NOTE: This medicine is only for you. Do not share this medicine with others. What if I miss a dose? It is important not to miss your dose. Call your doctor or health care professional if you are unable to keep an appointment. What may interact with this medicine? -certain antibiotics given by injection -NSAIDs, medicines for pain and inflammation, like ibuprofen or naproxen -some diuretics like bumetanide, furosemide -teriparatide -thalidomide This list may not describe all possible interactions. Give your health care provider a list of all the medicines, herbs, non-prescription drugs, or  dietary supplements you use. Also tell them if you smoke, drink alcohol, or use illegal drugs. Some items may interact with your medicine. What should I watch for while using this medicine? Visit your doctor or health care professional for regular checkups. It may be some time before you see the benefit from this medicine. Do not stop taking your medicine unless your doctor tells you to. Your doctor may order blood tests or other tests to see how you are doing. Women should inform their doctor if they wish to become pregnant or think they might be pregnant. There is a potential for serious side effects to an unborn child. Talk to your health care professional or pharmacist for more information. You should make sure that you get enough calcium and vitamin D while you are taking this medicine. Discuss the foods you eat and the vitamins you take with your health care professional. Some people who take this medicine have severe bone, joint, and/or muscle pain. This medicine may also increase your risk for jaw problems or a broken thigh bone. Tell your doctor right away if you have severe pain in your jaw, bones, joints, or muscles. Tell your doctor if you have any pain that does not go away or that gets worse. Tell your dentist and dental surgeon that you are taking this medicine. You should not have major dental surgery while on this medicine. See your dentist to have a dental exam and fix any dental problems before starting this medicine. Take good care of your teeth while on this medicine. Make sure you see your dentist for regular follow-up appointments. What side effects may I notice from receiving this medicine? Side effects that   you should report to your doctor or health care professional as soon as possible: -allergic reactions like skin rash, itching or hives, swelling of the face, lips, or tongue -anxiety, confusion, or depression -breathing problems -changes in vision -eye pain -feeling faint or  lightheaded, falls -jaw pain, especially after dental work -mouth sores -muscle cramps, stiffness, or weakness -redness, blistering, peeling or loosening of the skin, including inside the mouth -trouble passing urine or change in the amount of urine Side effects that usually do not require medical attention (report to your doctor or health care professional if they continue or are bothersome): -bone, joint, or muscle pain -constipation -diarrhea -fever -hair loss -irritation at site where injected -loss of appetite -nausea, vomiting -stomach upset -trouble sleeping -trouble swallowing -weak or tired This list may not describe all possible side effects. Call your doctor for medical advice about side effects. You may report side effects to FDA at 1-800-FDA-1088. Where should I keep my medicine? This drug is given in a hospital or clinic and will not be stored at home. NOTE: This sheet is a summary. It may not cover all possible information. If you have questions about this medicine, talk to your doctor, pharmacist, or health care provider.  2019 Elsevier/Gold Standard (2013-10-07 14:19:39) Ferumoxytol injection What is this medicine? FERUMOXYTOL is an iron complex. Iron is used to make healthy red blood cells, which carry oxygen and nutrients throughout the body. This medicine is used to treat iron deficiency anemia. This medicine may be used for other purposes; ask your health care provider or pharmacist if you have questions. COMMON BRAND NAME(S): Feraheme What should I tell my health care provider before I take this medicine? They need to know if you have any of these conditions: -anemia not caused by low iron levels -high levels of iron in the blood -magnetic resonance imaging (MRI) test scheduled -an unusual or allergic reaction to iron, other medicines, foods, dyes, or preservatives -pregnant or trying to get pregnant -breast-feeding How should I use this medicine? This  medicine is for injection into a vein. It is given by a health care professional in a hospital or clinic setting. Talk to your pediatrician regarding the use of this medicine in children. Special care may be needed. Overdosage: If you think you have taken too much of this medicine contact a poison control center or emergency room at once. NOTE: This medicine is only for you. Do not share this medicine with others. What if I miss a dose? It is important not to miss your dose. Call your doctor or health care professional if you are unable to keep an appointment. What may interact with this medicine? This medicine may interact with the following medications: -other iron products This list may not describe all possible interactions. Give your health care provider a list of all the medicines, herbs, non-prescription drugs, or dietary supplements you use. Also tell them if you smoke, drink alcohol, or use illegal drugs. Some items may interact with your medicine. What should I watch for while using this medicine? Visit your doctor or healthcare professional regularly. Tell your doctor or healthcare professional if your symptoms do not start to get better or if they get worse. You may need blood work done while you are taking this medicine. You may need to follow a special diet. Talk to your doctor. Foods that contain iron include: whole grains/cereals, dried fruits, beans, or peas, leafy green vegetables, and organ meats (liver, kidney). What side effects may I  notice from receiving this medicine? Side effects that you should report to your doctor or health care professional as soon as possible: -allergic reactions like skin rash, itching or hives, swelling of the face, lips, or tongue -breathing problems -changes in blood pressure -feeling faint or lightheaded, falls -fever or chills -flushing, sweating, or hot feelings -swelling of the ankles or feet Side effects that usually do not require medical  attention (report to your doctor or health care professional if they continue or are bothersome): -diarrhea -headache -nausea, vomiting -stomach pain This list may not describe all possible side effects. Call your doctor for medical advice about side effects. You may report side effects to FDA at 1-800-FDA-1088. Where should I keep my medicine? This drug is given in a hospital or clinic and will not be stored at home. NOTE: This sheet is a summary. It may not cover all possible information. If you have questions about this medicine, talk to your doctor, pharmacist, or health care provider.  2019 Elsevier/Gold Standard (2016-06-29 20:21:10)

## 2018-10-13 ENCOUNTER — Other Ambulatory Visit: Payer: TRICARE For Life (TFL)

## 2018-10-13 ENCOUNTER — Ambulatory Visit: Payer: TRICARE For Life (TFL)

## 2018-10-18 ENCOUNTER — Other Ambulatory Visit: Payer: Self-pay

## 2018-10-18 ENCOUNTER — Telehealth (INDEPENDENT_AMBULATORY_CARE_PROVIDER_SITE_OTHER): Payer: Medicare Other | Admitting: Cardiology

## 2018-10-18 ENCOUNTER — Encounter: Payer: Self-pay | Admitting: Cardiology

## 2018-10-18 VITALS — BP 122/83 | Temp 97.8°F

## 2018-10-18 DIAGNOSIS — R0609 Other forms of dyspnea: Secondary | ICD-10-CM

## 2018-10-18 DIAGNOSIS — I10 Essential (primary) hypertension: Secondary | ICD-10-CM

## 2018-10-18 DIAGNOSIS — R0600 Dyspnea, unspecified: Secondary | ICD-10-CM

## 2018-10-18 DIAGNOSIS — M7989 Other specified soft tissue disorders: Secondary | ICD-10-CM | POA: Insufficient documentation

## 2018-10-18 DIAGNOSIS — C9002 Multiple myeloma in relapse: Secondary | ICD-10-CM

## 2018-10-18 DIAGNOSIS — D631 Anemia in chronic kidney disease: Secondary | ICD-10-CM

## 2018-10-18 DIAGNOSIS — N183 Chronic kidney disease, stage 3 unspecified: Secondary | ICD-10-CM

## 2018-10-18 HISTORY — DX: Other specified soft tissue disorders: M79.89

## 2018-10-18 NOTE — Patient Instructions (Signed)
Medication Instructions:  Your physician recommends that you continue on your current medications as directed. Please refer to the Current Medication list given to you today.  If you need a refill on your cardiac medications before your next appointment, please call your pharmacy.   Lab work: Your physician recommends that you return for lab work: bmp and bnp at your oncology appt.   If you have labs (blood work) drawn today and your tests are completely normal, you will receive your results only by: Marland Kitchen MyChart Message (if you have MyChart) OR . A paper copy in the mail If you have any lab test that is abnormal or we need to change your treatment, we will call you to review the results.  Testing/Procedures: None.   Follow-Up: At [facility], you and your health needs are our priority.  As part of our continuing mission to provide you with exceptional heart care, we have created designated Provider Care Teams.  These Care Teams include your primary Cardiologist (physician) and Advanced Practice Providers (APPs -  Physician Assistants and Nurse Practitioners) who all work together to provide you with the care you need, when you need it. You will need a follow up appointment in 2 months.  Please call our office 2 months in advance to schedule this appointment.  You may see No primary care provider on file. or another member of our [employer] Provider Team in [place]: Shirlee More, MD . Jyl Heinz, MD  Any Other Special Instructions Will Be Listed Below (If Applicable).

## 2018-10-18 NOTE — Progress Notes (Signed)
Virtual Visit via Telephone Note   This visit type was conducted due to national recommendations for restrictions regarding the COVID-19 Pandemic (e.g. social distancing) in an effort to limit this patient's exposure and mitigate transmission in our community.  Due to his co-morbid illnesses, this patient is at least at moderate risk for complications without adequate follow up.  This format is felt to be most appropriate for this patient at this time.  The patient did not have access to video technology/had technical difficulties with video requiring transitioning to audio format only (telephone).  All issues noted in this document were discussed and addressed.  No physical exam could be performed with this format.  Please refer to the patient's chart for his  consent to telehealth for [facility].  Evaluation Performed:  Follow-up visit  This visit type was conducted due to national recommendations for restrictions regarding the COVID-19 Pandemic (e.g. social distancing).  This format is felt to be most appropriate for this patient at this time.  All issues noted in this document were discussed and addressed.  No physical exam was performed (except for noted visual exam findings with Video Visits).  Please refer to the patient's chart (MyChart message for video visits and phone note for telephone visits) for the patient's consent to telehealth for [facility]  Date:  10/18/2018  ID: Robert [name], DOB 01/31/1934, MRN [id number]   Patient Location: [place] [address]   Provider location:   [place] [facility]  PCP:  Leanna Battles, MD  Cardiologist:  Jenne Campus, MD     Chief Complaint: I am doing better  History of Present Illness:    Robert [name] is a 85 y.o. male  who presents via audio/video conferencing for a telehealth visit today.  With diastolic congestive heart rate, chronic renal failure, hypertension, dyslipidemia, multiple  myeloma comes back to my office chief complaint this time is swelling of lower extremities likely swelling of lower extremities appears to be doing better.  He ended up going to the emergency room lab and then and being admitted echocardiogram showed preserved left ventricular ejection fraction just diastolic dysfunction.  Overall he is taking small dose of diuretic and doing better   The patient does not have symptoms concerning for COVID-19 infection (fever, chills, cough, or new SHORTNESS OF BREATH).    Prior CV studies:   The following studies were reviewed today:  Echocardiogram done 07 Oct 2018 showed: 1. The left ventricle has normal systolic function with an ejection fraction of 60-65%. The cavity size was normal. There is mildly increased left ventricular wall thickness. Left ventricular diastolic Doppler parameters are consistent with impaired  relaxation. Elevated left ventricular end-diastolic pressure The E/e' is 15.2.  2. The right ventricle has normal systolic function. The cavity was normal. There is no increase in right ventricular wall thickness.  3. Sclerosis without any evidence of stenosis of the aortic valve. Aortic valve regurgitation is trivial by color flow Doppler.     Past Medical History:  Diagnosis Date  . Anemia of chronic renal failure, stage 3 (moderate) ([place]) 10/06/2018  . Arthritis   . 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 Goals of care, counseling/discussion 05/12/2018  . Hyperlipidemia   . Hypertension   . Iron deficiency anemia 10/07/2018  . 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 Multiple myeloma ([place])    2006    Past Surgical History:  Procedure Laterality Date  . CATARACT EXTRACTION BILATERAL W/  ANTERIOR VITRECTOMY    . IR IMAGING GUIDED PORT INSERTION  05/30/2018  . LIMBAL STEM CELL TRANSPLANT       Current Meds  Medication Sig  . bimatoprost (LUMIGAN) 0.01 % SOLN Place 1 drop into both eyes at bedtime.   . Calcium Carbonate-Vit D-Min  (CALTRATE 600+D PLUS PO) Take 1 tablet by mouth daily.  . Cholecalciferol (VITAMIN D3) 1000 UNITS CAPS Take 1,000 Units by mouth daily.   . cloNIDine (CATAPRES) 0.1 MG tablet Take 1 tablet (0.1 mg total) by mouth 2 (two) times daily. Take an extra tablet as needed for blood pressure greater than 170/100 (Patient taking differently: Take 0.1 mg by mouth as needed. Take an extra tablet as needed for blood pressure greater than 170/100)  . cyanocobalamin ([employer]S VITAMIN B12) 2000 MCG tablet Take 2,000 mcg by mouth at bedtime.   . furosemide (LASIX) 20 MG tablet Take 2 tablets (40 mg total) by mouth daily.  Marland Kitchen losartan (COZAAR) 100 MG tablet Take 100 mg by mouth daily.  . metolazone (ZAROXOLYN) 5 MG tablet Take 1 pill 1 hour prior to taking Lasix daily  . Multiple Vitamin (MULITIVITAMIN WITH MINERALS) TABS Take 1 tablet by mouth daily.  Vladimir Faster Glycol-Propyl Glycol (SYSTANE OP) Place 1 drop into both eyes daily as needed (dry eyes).   . potassium chloride SA (K-DUR,KLOR-CON) 20 MEQ tablet Take 20 mEq by mouth daily.  . pravastatin (PRAVACHOL) 20 MG tablet Take 20 mg by mouth at bedtime.  . traMADol (ULTRAM) 50 MG tablet Take 1 tablet (50 mg total) by mouth every 6 (six) hours as needed.  . Zoledronic Acid (ZOMETA) 4 MG/100ML IVPB Inject 4 mg into the vein every 4 (four) months. Administered by Dr. Jonette Eva at [facility] - last infusion mid July 2019      Family History: The patient's family history includes Diabetes in his brother; Heart attack in his father; Heart disease in his father; Throat cancer in his mother. There is no history of Colon cancer.   ROS:   Please see the history of present illness.     All other systems reviewed and are negative.   Labs/Other Tests and Data Reviewed:     Recent Labs: 12/24/2017: Magnesium 2.0; TSH 1.614 09/15/2018: B Natriuretic Peptide 76.7 10/06/2018: ALT 27; BUN 22; Creatinine 0.93; Hemoglobin 7.5; Platelet Count 333; Potassium 3.3; Sodium  128  Recent Lipid Panel No results found for: CHOL, TRIG, HDL, CHOLHDL, VLDL, LDLCALC, LDLDIRECT    Exam:    Vital Signs:  BP 122/83   Temp 97.8 F (36.6 C)     Wt Readings from Last 3 Encounters:  10/06/18 212 lb 7 oz (96.4 kg)  09/08/18 217 lb (98.4 kg)  09/08/18 217 lb (98.4 kg)     Well nourished, well developed in no acute distress. Alert awake oriented x3.  Not in distress from talking to him.  Diagnosis for this visit:   1. Essential hypertension   2. Swelling of both lower extremities   3. Anemia of chronic renal failure, stage 3 (moderate) (HCC)   4. Multiple myeloma in relapse [facility])      ASSESSMENT & PLAN:    1.  Essential hypertension blood pressure appears to be better controlled.  We will continue present management. 2.  Swelling of lower extremities much improved.  I think is a multifactorial phenomenon hypoalbuminemia, anemia as well as diastolic dysfunction play roles here.  Appropriate management with diuretics seems to be helping.  Will  check Chem-7 since last potassium was low.  I will also make sure he will get proBNP done. 3.  Anemia of chronic renal failure.  That being managed excellently by oncology team.  Multiple myeloma is managed by them as well.  COVID-19 Education: The signs and symptoms of COVID-19 were discussed with the patient and how to seek care for testing (follow up with PCP or arrange E-visit).  The importance of social distancing was discussed today.  Patient Risk:   After full review of this patients clinical status, I feel that they are at least moderate risk at this time.  Time:   Today, I have spent 17 minutes with the patient with telehealth technology discussing pt health issues.  I spent 5 minutes reviewing her chart before the visit.  Visit was finished at 3:21 PM.    Medication Adjustments/Labs and Tests Ordered: Current medicines are reviewed at length with the patient today.  Concerns regarding medicines are outlined  above.  No orders of the defined types were placed in this encounter.  Medication changes: No orders of the defined types were placed in this encounter.    Disposition: Follow-up in 2 months  Signed, Park Liter, MD, [facility] 10/18/2018 3:23 PM    [place]

## 2018-10-20 ENCOUNTER — Inpatient Hospital Stay: Payer: Medicare Other

## 2018-10-20 ENCOUNTER — Encounter: Payer: Self-pay | Admitting: Hematology & Oncology

## 2018-10-20 ENCOUNTER — Telehealth: Payer: Self-pay | Admitting: Hematology & Oncology

## 2018-10-20 ENCOUNTER — Inpatient Hospital Stay (HOSPITAL_BASED_OUTPATIENT_CLINIC_OR_DEPARTMENT_OTHER): Payer: Medicare Other | Admitting: Hematology & Oncology

## 2018-10-20 ENCOUNTER — Ambulatory Visit: Payer: TRICARE For Life (TFL)

## 2018-10-20 ENCOUNTER — Other Ambulatory Visit: Payer: Self-pay

## 2018-10-20 VITALS — BP 136/81 | HR 61 | Temp 98.7°F | Resp 18 | Wt 201.0 lb

## 2018-10-20 DIAGNOSIS — D631 Anemia in chronic kidney disease: Secondary | ICD-10-CM | POA: Diagnosis not present

## 2018-10-20 DIAGNOSIS — D508 Other iron deficiency anemias: Secondary | ICD-10-CM

## 2018-10-20 DIAGNOSIS — Z9484 Stem cells transplant status: Secondary | ICD-10-CM | POA: Diagnosis not present

## 2018-10-20 DIAGNOSIS — D469 Myelodysplastic syndrome, unspecified: Secondary | ICD-10-CM

## 2018-10-20 DIAGNOSIS — M899 Disorder of bone, unspecified: Secondary | ICD-10-CM

## 2018-10-20 DIAGNOSIS — C9002 Multiple myeloma in relapse: Secondary | ICD-10-CM

## 2018-10-20 DIAGNOSIS — C9000 Multiple myeloma not having achieved remission: Secondary | ICD-10-CM

## 2018-10-20 DIAGNOSIS — N183 Chronic kidney disease, stage 3 unspecified: Secondary | ICD-10-CM

## 2018-10-20 DIAGNOSIS — Z79899 Other long term (current) drug therapy: Secondary | ICD-10-CM | POA: Diagnosis not present

## 2018-10-20 LAB — CMP (CANCER CENTER ONLY)
ALT: 10 U/L (ref 0–44)
AST: 14 U/L — ABNORMAL LOW (ref 15–41)
Albumin: 3.5 g/dL (ref 3.5–5.0)
Alkaline Phosphatase: 69 U/L (ref 38–126)
Anion gap: 8 (ref 5–15)
BUN: 23 mg/dL (ref 8–23)
CO2: 28 mmol/L (ref 22–32)
Calcium: 9.1 mg/dL (ref 8.9–10.3)
Chloride: 100 mmol/L (ref 98–111)
Creatinine: 1.08 mg/dL (ref 0.61–1.24)
GFR, Est AFR Am: >60 mL/min (ref >60)
GFR, Estimated: >60 mL/min (ref >60)
Glucose, Bld: 113 mg/dL — ABNORMAL HIGH (ref 70–99)
Potassium: 3.4 mmol/L — ABNORMAL LOW (ref 3.5–5.1)
Sodium: 136 mmol/L (ref 135–145)
Total Bilirubin: 0.8 mg/dL (ref 0.3–1.2)
Total Protein: 7.3 g/dL (ref 6.5–8.1)

## 2018-10-20 LAB — RETICULOCYTES
Immature Retic Fract: 18.9 % — ABNORMAL HIGH (ref 2.3–15.9)
RBC.: 2.97 MIL/uL — ABNORMAL LOW (ref 4.22–5.81)
Retic Count, Absolute: 83.5 10*3/uL (ref 19.0–186.0)
Retic Ct Pct: 2.8 % (ref 0.4–3.1)

## 2018-10-20 LAB — CBC WITH DIFFERENTIAL (CANCER CENTER ONLY)
Abs Immature Granulocytes: 0.03 10*3/uL (ref 0.00–0.07)
Basophils Absolute: 0 10*3/uL (ref 0.0–0.1)
Basophils Relative: 1 %
Eosinophils Absolute: 0.1 10*3/uL (ref 0.0–0.5)
Eosinophils Relative: 2 %
HCT: 28.7 % — ABNORMAL LOW (ref 39.0–52.0)
Hemoglobin: 8.6 g/dL — ABNORMAL LOW (ref 13.0–17.0)
Immature Granulocytes: 1 %
Lymphocytes Relative: 19 %
Lymphs Abs: 0.8 10*3/uL (ref 0.7–4.0)
MCH: 29.1 pg (ref 26.0–34.0)
MCHC: 30 g/dL (ref 30.0–36.0)
MCV: 97 fL (ref 80.0–100.0)
Monocytes Absolute: 0.4 10*3/uL (ref 0.1–1.0)
Monocytes Relative: 10 %
Neutro Abs: 2.9 10*3/uL (ref 1.7–7.7)
Neutrophils Relative %: 67 %
Platelet Count: 293 10*3/uL (ref 150–400)
RBC: 2.96 MIL/uL — ABNORMAL LOW (ref 4.22–5.81)
RDW: 21 % — ABNORMAL HIGH (ref 11.5–15.5)
WBC Count: 4.2 10*3/uL (ref 4.0–10.5)
nRBC: 0 % (ref 0.0–0.2)

## 2018-10-20 LAB — IRON AND TIBC
Iron: 87 ug/dL (ref 42–163)
Saturation Ratios: 37 % (ref 20–55)
TIBC: 235 ug/dL (ref 202–409)
UIBC: 148 ug/dL (ref 117–376)

## 2018-10-20 LAB — SAMPLE TO BLOOD BANK

## 2018-10-20 LAB — FERRITIN: Ferritin: 2401 ng/mL — ABNORMAL HIGH (ref 24–336)

## 2018-10-20 MED ORDER — DARBEPOETIN ALFA 300 MCG/0.6ML IJ SOSY
PREFILLED_SYRINGE | INTRAMUSCULAR | Status: AC
  Filled 2018-10-20: qty 0.6

## 2018-10-20 MED ORDER — DARBEPOETIN ALFA 300 MCG/0.6ML IJ SOSY
300.0000 ug | PREFILLED_SYRINGE | Freq: Once | INTRAMUSCULAR | Status: AC
  Administered 2018-10-20: 300 ug via SUBCUTANEOUS

## 2018-10-20 MED ORDER — HEPARIN SOD (PORK) LOCK FLUSH 100 UNIT/ML IV SOLN
500.0000 [IU] | Freq: Once | INTRAVENOUS | Status: AC | PRN
  Administered 2018-10-20: 500 [IU]
  Filled 2018-10-20: qty 5

## 2018-10-20 MED ORDER — SODIUM CHLORIDE 0.9% FLUSH
10.0000 mL | Freq: Once | INTRAVENOUS | Status: AC | PRN
  Administered 2018-10-20: 10 mL
  Filled 2018-10-20: qty 10

## 2018-10-20 NOTE — Telephone Encounter (Signed)
lmom to inform pt of 6/18 at 0800 per 5/28 los

## 2018-10-20 NOTE — Progress Notes (Signed)
Hematology and Oncology Follow Up Visit  IRIS TATSCH [id number] 1934-04-08 85 y.o. 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 3 weeks for hemoglobin less than 10   Interim History:  Mr. Gripp is here today for follow-up.  Trying to make to his blood pressure and his edema in his legs.  He is now able to wear tennis shoes.  His weight is coming down.  The edema is much better.  We put him on Zaroxolyn followed by Lasix.  He is also on regular blood pressure medication.  Of note, I did send off a MDS panel on his blood.  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  Again, is unclear what this signifies.  His myeloma is finally responding.  His last M spike was down to 0.2 g/dL.  We are now able to use Aranesp on him.  Hopefully, we will be able to get his hemoglobin higher and I think this will help his edema better.  I keep telling him that he must keep taking his blood pressure medication.  Currently, his performance status is ECOG 1-2.    Medications:  Allergies as of 10/20/2018   No Known Allergies     Medication List       Accurate as of Oct 20, 2018  9:34 AM. If you have any questions, ask your nurse or doctor.        aspirin 81 MG tablet Take 81 mg by mouth daily.   bimatoprost 0.01 % Soln Commonly known as:  LUMIGAN Place 1 drop into both eyes at bedtime.   CALTRATE 600+D PLUS PO Take 1 tablet by mouth daily.   carvedilol 12.5 MG tablet Commonly known as:  COREG Take 1 tablet (12.5 mg total) by mouth 2 (two) times daily  with a meal.   cloNIDine 0.1 MG tablet Commonly known as:  CATAPRES Take 1 tablet (0.1 mg total) by mouth 2 (two) times daily. Take an extra tablet as needed for blood pressure greater than 170/100 What changed:    when to take this  reasons to take this   CVS VITAMIN B12 2000 MCG tablet Generic drug:  cyanocobalamin Take 2,000 mcg by mouth at bedtime.   famciclovir 250 MG tablet Commonly known as:  FAMVIR Take 1 tablet (250 mg total) by mouth daily.   furosemide 20 MG tablet Commonly known as:  LASIX Take 2 tablets (40 mg total) by mouth daily.   losartan 100 MG tablet Commonly known as:  COZAAR Take 100 mg by mouth daily.   metolazone 5 MG tablet Commonly known as:  ZAROXOLYN Take 1 pill 1 hour prior to taking Lasix daily   multivitamin with minerals Tabs tablet Take 1 tablet by mouth daily.   potassium chloride SA 20 MEQ tablet Commonly known as:  K-DUR Take 20 mEq by mouth daily.   pravastatin 20 MG tablet Commonly known as:  PRAVACHOL Take 20 mg by mouth at bedtime.   SYSTANE OP Place 1 drop into both eyes daily as needed (dry eyes).   traMADol  50 MG tablet Commonly known as:  ULTRAM Take 1 tablet (50 mg total) by mouth every 6 (six) hours as needed.   Vitamin D3 25 MCG (1000 UT) Caps Take 1,000 Units by mouth daily.   Zometa 4 MG/100ML IVPB Generic drug:  Zoledronic Acid Inject 4 mg into the vein every 4 (four) months. Administered by Dr. Jonette Eva at [facility] - last infusion mid July 2019       Allergies: No Known Allergies  Past Medical History, Surgical history, Social history, and Family History were reviewed and updated.  Review of Systems: Review of Systems  Constitutional: Negative.   HENT: Negative.   Eyes: Negative.   Respiratory: Negative.   Cardiovascular: Positive for leg swelling.  Gastrointestinal: Positive for heartburn.  Genitourinary: Negative.   Musculoskeletal: Positive for joint pain.  Skin: Negative.    Neurological: Negative.   Endo/Heme/Allergies: Negative.   Psychiatric/Behavioral: Negative.      Physical Exam:  weight is 201 lb (91.2 kg). His oral temperature is 98.7 F (37.1 C). His blood pressure is 136/81 and his pulse is 61. His respiration is 18 and oxygen saturation is 100%.   Wt Readings from Last 3 Encounters:  10/20/18 201 lb (91.2 kg)  10/06/18 212 lb 7 oz (96.4 kg)  09/08/18 217 lb (98.4 kg)    Physical Exam Vitals signs reviewed.  HENT:     Head: Normocephalic and atraumatic.  Eyes:     Pupils: Pupils are equal, round, and reactive to light.  Neck:     Musculoskeletal: Normal range of motion.  Cardiovascular:     Rate and Rhythm: Normal rate and regular rhythm.     Heart sounds: Normal heart sounds.  Pulmonary:     Effort: Pulmonary effort is normal.     Breath sounds: Normal breath sounds.  Abdominal:     General: Bowel sounds are normal.     Palpations: Abdomen is soft.     Comments: Abdominal exam is obese but soft.  He has good bowel sounds.  There is no fluid wave.  There is no palpable liver or spleen tip.  Musculoskeletal: Normal range of motion.        General: No tenderness or deformity.     Comments: Extremities shows 3+ edema in his lower legs and feet.  This is pitting.  He has decent range of motion of his joints.  He has decent strength.  Lymphadenopathy:     Cervical: No cervical adenopathy.  Skin:    General: Skin is warm and dry.     Findings: No erythema or rash.  Neurological:     Mental Status: He is alert and oriented to person, place, and time.  Psychiatric:        Behavior: Behavior normal.        Thought Content: Thought content normal.        Judgment: Judgment normal.      Lab Results  Component Value Date   WBC 4.2 10/20/2018   HGB 8.6 (L) 10/20/2018   HCT 28.7 (L) 10/20/2018   MCV 97.0 10/20/2018   PLT 293 10/20/2018   Lab Results  Component Value Date   FERRITIN 2,374 (H) 10/06/2018   IRON 33 (L) 10/06/2018    TIBC 196 (L) 10/06/2018   UIBC 163 10/06/2018   IRONPCTSAT 17 (L) 10/06/2018   Lab Results  Component Value Date   RETICCTPCT 2.8 10/20/2018   RBC 2.97 (L) 10/20/2018   RBC 2.96 (L) 10/20/2018  Lab Results  Component Value Date   KPAFRELGTCHN 51.6 (H) 09/29/2018   LAMBDASER 34.1 (H) 09/29/2018   KAPLAMBRATIO 1.51 09/29/2018   Lab Results  Component Value Date   IGGSERUM 1,537 09/29/2018   IGGSERUM 783 09/29/2018   IGA 90 09/29/2018   IGA 45 (L) 09/29/2018   IGMSERUM 50 09/29/2018   IGMSERUM 26 09/29/2018   Lab Results  Component Value Date   TOTALPROTELP 2.4 (L) 09/29/2018   ALBUMINELP 1.1 (L) 09/29/2018   A1GS 0.2 09/29/2018   A2GS 0.4 09/29/2018   BETS 0.3 (L) 09/29/2018   BETA2SER 0.3 01/03/2015   GAMS 0.4 09/29/2018   MSPIKE 0.2 (H) 09/29/2018   SPEI Comment 07/28/2018     Chemistry      Component Value Date/Time   NA 136 10/20/2018 0840   NA 137 04/08/2017 1141   NA 138 10/29/2015 1029   K 3.4 (L) 10/20/2018 0840   K 3.4 04/08/2017 1141   K 3.9 10/29/2015 1029   CL 100 10/20/2018 0840   CL 102 04/08/2017 1141   CO2 28 10/20/2018 0840   CO2 30 04/08/2017 1141   CO2 27 10/29/2015 1029   BUN 23 10/20/2018 0840   BUN 16 04/08/2017 1141   BUN 13.8 10/29/2015 1029   CREATININE 1.08 10/20/2018 0840   CREATININE 1.3 (H) 04/08/2017 1141   CREATININE 1.1 10/29/2015 1029      Component Value Date/Time   CALCIUM 9.1 10/20/2018 0840   CALCIUM 9.3 04/08/2017 1141   CALCIUM 9.3 10/29/2015 1029   ALKPHOS 69 10/20/2018 0840   ALKPHOS 66 04/08/2017 1141   ALKPHOS 52 10/29/2015 1029   AST 14 (L) 10/20/2018 0840   AST 20 10/29/2015 1029   ALT 10 10/20/2018 0840   ALT 21 04/08/2017 1141   ALT 14 10/29/2015 1029   BILITOT 0.8 10/20/2018 0840   BILITOT 0.75 10/29/2015 1029       Impression and Plan: Mr. Geiman is a very pleasant 85 yo African American gentleman with relapsedIgG kappamyeloma.He has a history of stem cell transplant back in 2006.  Again,  I truly believe that there is an element of myelodysplasia with his bone marrow.  I realize he has the myeloma but I also believe that myelodysplasia is in the background that is causing this anemia to be so significant.  Hopefully, we will be able to get his hemoglobin better.  I really have to believe that the daratumumab is going to help with the myeloma and also be "gentle" on his bone marrow.  If his blood pressure can stay under control, I know that he will get better.  Of note, we did do an echocardiogram on him a few weeks ago.  He had an ejection fraction of 60-65%.  I would not get him back in about 3 weeks.  I think we can finally move his appointments out fluid further as we are making headway with him.   Volanda Napoleon, MD 5/28/20209:34 AM

## 2018-10-20 NOTE — Addendum Note (Signed)
Addended by: Ashok Norris on: 10/20/2018 08:33 AM   Modules accepted: Orders

## 2018-10-20 NOTE — Patient Instructions (Signed)

## 2018-10-20 NOTE — Patient Instructions (Signed)

## 2018-10-21 LAB — PROTEIN ELECTROPHORESIS, SERUM
A/G Ratio: 0.7 (ref 0.7–1.7)
Albumin ELP: 2.8 g/dL — ABNORMAL LOW (ref 2.9–4.4)
Alpha-1-Globulin: 0.3 g/dL (ref 0.0–0.4)
Alpha-2-Globulin: 0.9 g/dL (ref 0.4–1.0)
Beta Globulin: 1 g/dL (ref 0.7–1.3)
Gamma Globulin: 1.7 g/dL (ref 0.4–1.8)
Globulin, Total: 3.8 g/dL (ref 2.2–3.9)
M-Spike, %: 0.6 g/dL — ABNORMAL HIGH
Total Protein ELP: 6.6 g/dL (ref 6.0–8.5)

## 2018-10-21 LAB — IGG, IGA, IGM
IgA: 86 mg/dL (ref 61–437)
IgG (Immunoglobin G), Serum: 1893 mg/dL — ABNORMAL HIGH (ref 603–1613)
IgM (Immunoglobulin M), Srm: 69 mg/dL (ref 15–143)

## 2018-10-21 LAB — KAPPA/LAMBDA LIGHT CHAINS
Kappa free light chain: 65.7 mg/L — ABNORMAL HIGH (ref 3.3–19.4)
Kappa, lambda light chain ratio: 2.74 — ABNORMAL HIGH (ref 0.26–1.65)
Lambda free light chains: 24 mg/L (ref 5.7–26.3)

## 2018-10-31 ENCOUNTER — Telehealth: Payer: Self-pay

## 2018-10-31 NOTE — Telephone Encounter (Signed)
Received call from pt with questions related to new Darzalex infusions. Pt questions if he has other options as he does not wish to be here for 8 hours. Discussed with pt that the first treatment will be long but if he tolerates it well without incident the subsequent treatments will be faster and he may be a candidate for rapid infusion. Also Darzalex has been approved for SQ administration and that would decrease chair time to less than 1 hour, however we do not have a timeline for when this will be available in the health system.  Pt also questions if he could push the infusion out x 1 week so the swelling in his feet could continue to improve. Pt reports his edema is "better" but he would feel more comfortable waiting on infusion. Infusion is currently scheduled for 9 days from now. Asked pt if he would want to monitor edema and call us early next week. Pt wished to r/s infusion now.  Discussed with Dr Marin Olp who says ok for pt to push appts out x 1 week. No need for labs in the interim.  Message to schedulers. dph

## 2018-11-10 ENCOUNTER — Other Ambulatory Visit: Payer: Medicare Other

## 2018-11-10 ENCOUNTER — Ambulatory Visit: Payer: Medicare Other

## 2018-11-10 ENCOUNTER — Ambulatory Visit: Payer: Medicare Other | Admitting: Family

## 2018-11-16 ENCOUNTER — Other Ambulatory Visit: Payer: Self-pay

## 2018-11-16 ENCOUNTER — Other Ambulatory Visit: Payer: Self-pay | Admitting: *Deleted

## 2018-11-16 ENCOUNTER — Inpatient Hospital Stay (HOSPITAL_BASED_OUTPATIENT_CLINIC_OR_DEPARTMENT_OTHER): Payer: Medicare Other | Admitting: Hematology & Oncology

## 2018-11-16 ENCOUNTER — Inpatient Hospital Stay: Payer: Medicare Other

## 2018-11-16 ENCOUNTER — Encounter: Payer: Self-pay | Admitting: Hematology & Oncology

## 2018-11-16 ENCOUNTER — Inpatient Hospital Stay: Payer: Medicare Other | Attending: Hematology & Oncology

## 2018-11-16 VITALS — BP 167/71 | HR 65 | Temp 98.0°F | Resp 17 | Wt 204.0 lb

## 2018-11-16 VITALS — BP 136/69 | HR 67 | Temp 97.5°F | Resp 17

## 2018-11-16 DIAGNOSIS — D469 Myelodysplastic syndrome, unspecified: Secondary | ICD-10-CM | POA: Diagnosis not present

## 2018-11-16 DIAGNOSIS — D630 Anemia in neoplastic disease: Secondary | ICD-10-CM | POA: Insufficient documentation

## 2018-11-16 DIAGNOSIS — Z9484 Stem cells transplant status: Secondary | ICD-10-CM | POA: Diagnosis not present

## 2018-11-16 DIAGNOSIS — C9002 Multiple myeloma in relapse: Secondary | ICD-10-CM

## 2018-11-16 DIAGNOSIS — Z79899 Other long term (current) drug therapy: Secondary | ICD-10-CM | POA: Insufficient documentation

## 2018-11-16 DIAGNOSIS — N183 Chronic kidney disease, stage 3 unspecified: Secondary | ICD-10-CM

## 2018-11-16 DIAGNOSIS — C9000 Multiple myeloma not having achieved remission: Secondary | ICD-10-CM

## 2018-11-16 DIAGNOSIS — Z5112 Encounter for antineoplastic immunotherapy: Secondary | ICD-10-CM | POA: Insufficient documentation

## 2018-11-16 DIAGNOSIS — D631 Anemia in chronic kidney disease: Secondary | ICD-10-CM

## 2018-11-16 LAB — CBC WITH DIFFERENTIAL (CANCER CENTER ONLY)
Abs Immature Granulocytes: 0.02 10*3/uL (ref 0.00–0.07)
Basophils Absolute: 0 10*3/uL (ref 0.0–0.1)
Basophils Relative: 1 %
Eosinophils Absolute: 0.1 10*3/uL (ref 0.0–0.5)
Eosinophils Relative: 3 %
HCT: 34.8 % — ABNORMAL LOW (ref 39.0–52.0)
Hemoglobin: 11 g/dL — ABNORMAL LOW (ref 13.0–17.0)
Immature Granulocytes: 1 %
Lymphocytes Relative: 26 %
Lymphs Abs: 0.9 10*3/uL (ref 0.7–4.0)
MCH: 30.9 pg (ref 26.0–34.0)
MCHC: 31.6 g/dL (ref 30.0–36.0)
MCV: 97.8 fL (ref 80.0–100.0)
Monocytes Absolute: 0.4 10*3/uL (ref 0.1–1.0)
Monocytes Relative: 12 %
Neutro Abs: 2 10*3/uL (ref 1.7–7.7)
Neutrophils Relative %: 57 %
Platelet Count: 155 10*3/uL (ref 150–400)
RBC: 3.56 MIL/uL — ABNORMAL LOW (ref 4.22–5.81)
RDW: 20.4 % — ABNORMAL HIGH (ref 11.5–15.5)
WBC Count: 3.4 10*3/uL — ABNORMAL LOW (ref 4.0–10.5)
nRBC: 0 % (ref 0.0–0.2)

## 2018-11-16 LAB — CMP (CANCER CENTER ONLY)
ALT: 9 U/L (ref 0–44)
AST: 16 U/L (ref 15–41)
Albumin: 3.8 g/dL (ref 3.5–5.0)
Alkaline Phosphatase: 51 U/L (ref 38–126)
Anion gap: 6 (ref 5–15)
BUN: 19 mg/dL (ref 8–23)
CO2: 29 mmol/L (ref 22–32)
Calcium: 10 mg/dL (ref 8.9–10.3)
Chloride: 101 mmol/L (ref 98–111)
Creatinine: 1.03 mg/dL (ref 0.61–1.24)
GFR, Est AFR Am: >60 mL/min
GFR, Estimated: >60 mL/min
Glucose, Bld: 92 mg/dL (ref 70–99)
Potassium: 3.6 mmol/L (ref 3.5–5.1)
Sodium: 136 mmol/L (ref 135–145)
Total Bilirubin: 0.7 mg/dL (ref 0.3–1.2)
Total Protein: 6.5 g/dL (ref 6.5–8.1)

## 2018-11-16 LAB — RETICULOCYTES
Immature Retic Fract: 7 % (ref 2.3–15.9)
RBC.: 3.57 MIL/uL — ABNORMAL LOW (ref 4.22–5.81)
Retic Count, Absolute: 36.1 10*3/uL (ref 19.0–186.0)
Retic Ct Pct: 1 % (ref 0.4–3.1)

## 2018-11-16 LAB — IRON AND TIBC
Iron: 104 ug/dL (ref 42–163)
Saturation Ratios: 43 % (ref 20–55)
TIBC: 243 ug/dL (ref 202–409)
UIBC: 138 ug/dL (ref 117–376)

## 2018-11-16 LAB — TYPE AND SCREEN
ABO/RH(D): AB POS
Antibody Screen: NEGATIVE

## 2018-11-16 LAB — FERRITIN: Ferritin: 2026 ng/mL — ABNORMAL HIGH (ref 24–336)

## 2018-11-16 MED ORDER — DIPHENHYDRAMINE HCL 25 MG PO CAPS
50.0000 mg | ORAL_CAPSULE | Freq: Once | ORAL | Status: AC
  Administered 2018-11-16: 50 mg via ORAL

## 2018-11-16 MED ORDER — ACETAMINOPHEN 325 MG PO TABS
ORAL_TABLET | ORAL | Status: AC
  Filled 2018-11-16: qty 2

## 2018-11-16 MED ORDER — LORAZEPAM 0.5 MG PO TABS
ORAL_TABLET | ORAL | Status: AC
  Filled 2018-11-16: qty 1

## 2018-11-16 MED ORDER — SODIUM CHLORIDE 0.9 % IV SOLN
16.2000 mg/kg | Freq: Once | INTRAVENOUS | Status: AC
  Administered 2018-11-16: 1600 mg via INTRAVENOUS
  Filled 2018-11-16: qty 80

## 2018-11-16 MED ORDER — SODIUM CHLORIDE 0.9% FLUSH
10.0000 mL | INTRAVENOUS | Status: DC | PRN
  Administered 2018-11-16: 10 mL
  Filled 2018-11-16: qty 10

## 2018-11-16 MED ORDER — LORAZEPAM 0.5 MG PO TABS
0.5000 mg | ORAL_TABLET | Freq: Once | ORAL | Status: AC
  Administered 2018-11-16: 0.5 mg via ORAL

## 2018-11-16 MED ORDER — DIPHENHYDRAMINE HCL 25 MG PO CAPS
ORAL_CAPSULE | ORAL | Status: AC
  Filled 2018-11-16: qty 2

## 2018-11-16 MED ORDER — MONTELUKAST SODIUM 10 MG PO TABS
10.0000 mg | ORAL_TABLET | Freq: Every day | ORAL | Status: DC
  Administered 2018-11-16: 10 mg via ORAL
  Filled 2018-11-16: qty 1

## 2018-11-16 MED ORDER — SODIUM CHLORIDE 0.9 % IV SOLN
Freq: Once | INTRAVENOUS | Status: AC
  Administered 2018-11-16: 09:00:00 via INTRAVENOUS
  Filled 2018-11-16: qty 250

## 2018-11-16 MED ORDER — ACETAMINOPHEN 325 MG PO TABS
650.0000 mg | ORAL_TABLET | Freq: Once | ORAL | Status: AC
  Administered 2018-11-16: 650 mg via ORAL

## 2018-11-16 MED ORDER — METHYLPREDNISOLONE SODIUM SUCC 125 MG IJ SOLR
100.0000 mg | Freq: Once | INTRAMUSCULAR | Status: AC
  Administered 2018-11-16: 100 mg via INTRAVENOUS

## 2018-11-16 MED ORDER — HEPARIN SOD (PORK) LOCK FLUSH 100 UNIT/ML IV SOLN
500.0000 [IU] | Freq: Once | INTRAVENOUS | Status: AC | PRN
  Administered 2018-11-16: 500 [IU]
  Filled 2018-11-16: qty 5

## 2018-11-16 NOTE — Progress Notes (Signed)
Hematology and Oncology Follow Up Visit  Robert [name] [id number] [birth year] 85 y.o. 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 3 weeks for hemoglobin less than 10   Interim History:  Robert [name] is here today for follow-up.  He finally had him back.  He has yet to start the daratumumab.  I am not sure exactly why there is been such a way.  However, he does look a lot better.  His legs are much less swollen.  He really has benefited from the Lasix and Zaroxolyn combination.  His weight is also down.  His blood pressure is down.  His hemoglobin is better.  Again I told him that I really thought his hemoglobin would improve once his blood pressure got better.  We will now start him on the daratumumab.  I think this would be a lot easier on his bone marrow then actual chemotherapy.  There is been no issues with bleeding.  He has had no change in bowel or bladder habits.  Has had no rashes.  Currently, I would say performance status is ECOG 1.   Medications:  Allergies as of 11/16/2018   No Known Allergies     Medication List       Accurate as of November 16, 2018  8:33 AM. If you have any questions, ask your nurse or doctor.        aspirin 81 MG tablet Take 81 mg by mouth daily.   bimatoprost 0.01 % Soln Commonly known as: LUMIGAN Place 1 drop into both eyes at bedtime.   CALTRATE 600+D PLUS PO Take 1 tablet by mouth daily.   carvedilol 12.5 MG tablet Commonly known as: COREG Take 1 tablet (12.5 mg total) by mouth 2 (two) times daily with a meal.   cloNIDine 0.1 MG tablet Commonly  known as: CATAPRES Take 1 tablet (0.1 mg total) by mouth 2 (two) times daily. Take an extra tablet as needed for blood pressure greater than 170/100 What changed:   when to take this  reasons to take this   [employer] VITAMIN B12 2000 MCG tablet Generic drug: cyanocobalamin Take 2,000 mcg by mouth at bedtime.   famciclovir 250 MG tablet Commonly known as: FAMVIR Take 1 tablet (250 mg total) by mouth daily.   furosemide 20 MG tablet Commonly known as: LASIX Take 2 tablets (40 mg total) by mouth daily.   hydrochlorothiazide 25 MG tablet Commonly known as: HYDRODIURIL 25 mg daily.   losartan 100 MG tablet Commonly known as: COZAAR Take 100 mg by mouth daily.   metolazone 5 MG tablet Commonly known as: ZAROXOLYN Take 1 pill 1 hour prior to taking Lasix daily   multivitamin with minerals Tabs tablet Take 1 tablet by mouth daily.   potassium chloride SA 20 MEQ tablet Commonly known as: K-DUR Take 20 mEq by mouth daily.   pravastatin 20 MG tablet Commonly known as: PRAVACHOL Take 20 mg by mouth at bedtime.   SYSTANE OP Place 1 drop into both eyes daily as needed (dry eyes).   traMADol 50 MG tablet Commonly known as: Veatrice Bourbon  Take 1 tablet (50 mg total) by mouth every 6 (six) hours as needed.   Vitamin D3 25 MCG (1000 UT) Caps Take 1,000 Units by mouth daily.   Zometa 4 MG/100ML IVPB Generic drug: Zoledronic Acid Inject 4 mg into the vein every 4 (four) months. Administered by Dr. Jonette Eva at [facility] - last infusion mid July 2019       Allergies: No Known Allergies  Past Medical History, Surgical history, Social history, and Family History were reviewed and updated.  Review of Systems: Review of Systems  Constitutional: Negative.   HENT: Negative.   Eyes: Negative.   Respiratory: Negative.   Cardiovascular: Positive for leg swelling.  Gastrointestinal: Positive for heartburn.  Genitourinary: Negative.   Musculoskeletal: Positive for joint pain.   Skin: Negative.   Neurological: Negative.   Endo/Heme/Allergies: Negative.   Psychiatric/Behavioral: Negative.      Physical Exam:  weight is 204 lb (92.5 kg). His oral temperature is 98 F (36.7 C). His blood pressure is 167/71 (abnormal) and his pulse is 65. His respiration is 17 and oxygen saturation is 100%.   Wt Readings from Last 3 Encounters:  11/16/18 204 lb (92.5 kg)  10/20/18 201 lb (91.2 kg)  10/06/18 212 lb 7 oz (96.4 kg)    Physical Exam Vitals signs reviewed.  HENT:     Head: Normocephalic and atraumatic.  Eyes:     Pupils: Pupils are equal, round, and reactive to light.  Neck:     Musculoskeletal: Normal range of motion.  Cardiovascular:     Rate and Rhythm: Normal rate and regular rhythm.     Heart sounds: Normal heart sounds.  Pulmonary:     Effort: Pulmonary effort is normal.     Breath sounds: Normal breath sounds.  Abdominal:     General: Bowel sounds are normal.     Palpations: Abdomen is soft.     Comments: Abdominal exam is obese but soft.  He has good bowel sounds.  There is no fluid wave.  There is no palpable liver or spleen tip.  Musculoskeletal: Normal range of motion.        General: No tenderness or deformity.     Comments: Extremities shows 1+ edema in his lower legs and feet.  This is mildly pitting.  He has decent range of motion of his joints.  He has decent strength.  Lymphadenopathy:     Cervical: No cervical adenopathy.  Skin:    General: Skin is warm and dry.     Findings: No erythema or rash.  Neurological:     Mental Status: He is alert and oriented to person, place, and time.  Psychiatric:        Behavior: Behavior normal.        Thought Content: Thought content normal.        Judgment: Judgment normal.      Lab Results  Component Value Date   WBC 4.2 10/20/2018   HGB 8.6 (L) 10/20/2018   HCT 28.7 (L) 10/20/2018   MCV 97.0 10/20/2018   PLT 293 10/20/2018   Lab Results  Component Value Date   FERRITIN 2,401 (H)  10/20/2018   IRON 87 10/20/2018   TIBC 235 10/20/2018   UIBC 148 10/20/2018   IRONPCTSAT 37 10/20/2018   Lab Results  Component Value Date   RETICCTPCT 2.8 10/20/2018   RBC 2.97 (L) 10/20/2018   RBC 2.96 (L) 10/20/2018   Lab Results  Component Value Date  KPAFRELGTCHN 65.7 (H) 10/20/2018   LAMBDASER 24.0 10/20/2018   KAPLAMBRATIO 2.74 (H) 10/20/2018   Lab Results  Component Value Date   IGGSERUM 1,893 (H) 10/20/2018   IGA 86 10/20/2018   IGMSERUM 69 10/20/2018   Lab Results  Component Value Date   TOTALPROTELP 6.6 10/20/2018   ALBUMINELP 2.8 (L) 10/20/2018   A1GS 0.3 10/20/2018   A2GS 0.9 10/20/2018   BETS 1.0 10/20/2018   BETA2SER 0.3 01/03/2015   GAMS 1.7 10/20/2018   MSPIKE 0.6 (H) 10/20/2018   SPEI Comment 10/20/2018     Chemistry      Component Value Date/Time   NA 136 10/20/2018 0840   NA 137 04/08/2017 1141   NA 138 10/29/2015 1029   K 3.4 (L) 10/20/2018 0840   K 3.4 04/08/2017 1141   K 3.9 10/29/2015 1029   CL 100 10/20/2018 0840   CL 102 04/08/2017 1141   CO2 28 10/20/2018 0840   CO2 30 04/08/2017 1141   CO2 27 10/29/2015 1029   BUN 23 10/20/2018 0840   BUN 16 04/08/2017 1141   BUN 13.8 10/29/2015 1029   CREATININE 1.08 10/20/2018 0840   CREATININE 1.3 (H) 04/08/2017 1141   CREATININE 1.1 10/29/2015 1029      Component Value Date/Time   CALCIUM 9.1 10/20/2018 0840   CALCIUM 9.3 04/08/2017 1141   CALCIUM 9.3 10/29/2015 1029   ALKPHOS 69 10/20/2018 0840   ALKPHOS 66 04/08/2017 1141   ALKPHOS 52 10/29/2015 1029   AST 14 (L) 10/20/2018 0840   AST 20 10/29/2015 1029   ALT 10 10/20/2018 0840   ALT 21 04/08/2017 1141   ALT 14 10/29/2015 1029   BILITOT 0.8 10/20/2018 0840   BILITOT 0.75 10/29/2015 1029       Impression and Plan: Mr. Buddenhagen is a very pleasant 85 yo African American gentleman with relapsedIgG kappamyeloma.He has a history of stem cell transplant back in 2006.  Again, I truly believe that there is an element of  myelodysplasia with his bone marrow.  I realize he has the myeloma but I also believe that myelodysplasia is in the background that is causing this anemia to be so significant.  I am just mostly impressed by the fact that his blood pressure is better.  I am impressed by the fact that his legs look better and his weight is down.  Hopefully, we will now get some response with his myeloma.  I am sure his myeloma levels are significantly higher since he has had no therapy for about 2 months.  We will have him take weekly daratumumab now.  I will plan to get him back to see [place] in about 4 weeks.  Hopefully, we will see a response by then.  I spent about 35 minutes with him.  I had to counsel him about the daratumumab.  I had to make sure that his protocol was properly dosed inside.  I had to keep cautioned him about his weight and to keep his blood pressure under control.  Volanda Napoleon, MD 6/24/20208:33 AM

## 2018-11-16 NOTE — Patient Instructions (Signed)
Daratumumab injection What is this medicine? DARATUMUMAB (dar a toom ue mab) is a monoclonal antibody. It is used to treat multiple myeloma. This medicine may be used for other purposes; ask your health care provider or pharmacist if you have questions. COMMON BRAND NAME(S): DARZALEX What should I tell my health care provider before I take this medicine? They need to know if you have any of these conditions: -infection (especially a virus infection such as chickenpox, herpes, or hepatitis B virus) -lung or breathing disease -an unusual or allergic reaction to daratumumab, other medicines, foods, dyes, or preservatives -pregnant or trying to get pregnant -breast-feeding How should I use this medicine? This medicine is for infusion into a vein. It is given by a health care professional in a hospital or clinic setting. Talk to your pediatrician regarding the use of this medicine in children. Special care may be needed. Overdosage: If you think you have taken too much of this medicine contact a poison control center or emergency room at once. NOTE: This medicine is only for you. Do not share this medicine with others. What if I miss a dose? Keep appointments for follow-up doses as directed. It is important not to miss your dose. Call your doctor or health care professional if you are unable to keep an appointment. What may interact with this medicine? Interactions have not been studied. Give your health care provider a list of all the medicines, herbs, non-prescription drugs, or dietary supplements you use. Also tell them if you smoke, drink alcohol, or use illegal drugs. Some items may interact with your medicine. This list may not describe all possible interactions. Give your health care provider a list of all the medicines, herbs, non-prescription drugs, or dietary supplements you use. Also tell them if you smoke, drink alcohol, or use illegal drugs. Some items may interact with your  medicine. What should I watch for while using this medicine? This drug may make you feel generally unwell. Report any side effects. Continue your course of treatment even though you feel ill unless your doctor tells you to stop. This medicine can cause serious allergic reactions. To reduce your risk you may need to take medicine before treatment with this medicine. Take your medicine as directed. This medicine can affect the results of blood tests to match your blood type. These changes can last for up to 6 months after the final dose. Your healthcare provider will do blood tests to match your blood type before you start treatment. Tell all of your healthcare providers that you are being treated with this medicine before receiving a blood transfusion. This medicine can affect the results of some tests used to determine treatment response; extra tests may be needed to evaluate response. Do not become pregnant while taking this medicine or for 3 months after stopping it. Women should inform their doctor if they wish to become pregnant or think they might be pregnant. There is a potential for serious side effects to an unborn child. Talk to your health care professional or pharmacist for more information. What side effects may I notice from receiving this medicine? Side effects that you should report to your doctor or health care professional as soon as possible: -allergic reactions like skin rash, itching or hives, swelling of the face, lips, or tongue -breathing problems -chills -cough -dizziness -feeling faint or lightheaded -headache -low blood counts - this medicine may decrease the number of white blood cells, red blood cells and platelets. You may be at increased   risk for infections and bleeding. -nausea, vomiting -shortness of breath -signs of decreased platelets or bleeding - bruising, pinpoint red spots on the skin, black, tarry stools, blood in the urine -signs of decreased red blood  cells - unusually weak or tired, feeling faint or lightheaded, falls -signs of infection - fever or chills, cough, sore throat, pain or difficulty passing urine -signs and symptoms of liver injury like dark yellow or brown urine; general ill feeling or flu-like symptoms; light-colored stools; loss of appetite; right upper belly pain; unusually weak or tired; yellowing of the eyes or skin Side effects that usually do not require medical attention (report to your doctor or health care professional if they continue or are bothersome): -back pain -constipation -loss of appetite -diarrhea -joint pain -muscle cramps -pain, tingling, numbness in the hands or feet -swelling of the ankles, feet, hands -tiredness -trouble sleeping This list may not describe all possible side effects. Call your doctor for medical advice about side effects. You may report side effects to FDA at 1-800-FDA-1088. Where should I keep my medicine? Keep out of the reach of children. This drug is given in a hospital or clinic and will not be stored at home. NOTE: This sheet is a summary. It may not cover all possible information. If you have questions about this medicine, talk to your doctor, pharmacist, or health care provider.  2019 Elsevier/Gold Standard (2017-12-10 15:52:44)

## 2018-11-16 NOTE — Patient Instructions (Signed)

## 2018-11-17 LAB — IGG, IGA, IGM
IgA: 64 mg/dL (ref 61–437)
IgG (Immunoglobin G), Serum: 1607 mg/dL (ref 603–1613)
IgM (Immunoglobulin M), Srm: 63 mg/dL (ref 15–143)

## 2018-11-17 LAB — KAPPA/LAMBDA LIGHT CHAINS
Kappa free light chain: 51.6 mg/L — ABNORMAL HIGH (ref 3.3–19.4)
Kappa, lambda light chain ratio: 2.98 — ABNORMAL HIGH (ref 0.26–1.65)
Lambda free light chains: 17.3 mg/L (ref 5.7–26.3)

## 2018-11-18 ENCOUNTER — Other Ambulatory Visit: Payer: Self-pay | Admitting: Hematology & Oncology

## 2018-11-18 LAB — IMMUNOFIXATION REFLEX, SERUM
IgA: 65 mg/dL (ref 61–437)
IgG (Immunoglobin G), Serum: 1741 mg/dL — ABNORMAL HIGH (ref 603–1613)
IgM (Immunoglobulin M), Srm: 71 mg/dL (ref 15–143)

## 2018-11-18 LAB — PROTEIN ELECTROPHORESIS, SERUM, WITH REFLEX
A/G Ratio: 1.2 (ref 0.7–1.7)
Albumin ELP: 3.6 g/dL (ref 2.9–4.4)
Alpha-1-Globulin: 0.2 g/dL (ref 0.0–0.4)
Alpha-2-Globulin: 0.7 g/dL (ref 0.4–1.0)
Beta Globulin: 0.8 g/dL (ref 0.7–1.3)
Gamma Globulin: 1.5 g/dL (ref 0.4–1.8)
Globulin, Total: 3.1 g/dL (ref 2.2–3.9)
M-Spike, %: 0.7 g/dL — ABNORMAL HIGH
SPEP Interpretation: 0
Total Protein ELP: 6.7 g/dL (ref 6.0–8.5)

## 2018-11-22 ENCOUNTER — Other Ambulatory Visit: Payer: Self-pay | Admitting: *Deleted

## 2018-11-22 DIAGNOSIS — C9002 Multiple myeloma in relapse: Secondary | ICD-10-CM

## 2018-11-23 ENCOUNTER — Inpatient Hospital Stay: Payer: Medicare Other

## 2018-11-23 ENCOUNTER — Other Ambulatory Visit: Payer: Self-pay

## 2018-11-23 ENCOUNTER — Inpatient Hospital Stay: Payer: Medicare Other | Attending: Hematology & Oncology

## 2018-11-23 VITALS — BP 176/76 | HR 52 | Resp 16

## 2018-11-23 DIAGNOSIS — Z5112 Encounter for antineoplastic immunotherapy: Secondary | ICD-10-CM | POA: Diagnosis not present

## 2018-11-23 DIAGNOSIS — D630 Anemia in neoplastic disease: Secondary | ICD-10-CM | POA: Diagnosis not present

## 2018-11-23 DIAGNOSIS — C9002 Multiple myeloma in relapse: Secondary | ICD-10-CM | POA: Diagnosis not present

## 2018-11-23 DIAGNOSIS — Z9484 Stem cells transplant status: Secondary | ICD-10-CM | POA: Diagnosis not present

## 2018-11-23 DIAGNOSIS — Z79899 Other long term (current) drug therapy: Secondary | ICD-10-CM | POA: Insufficient documentation

## 2018-11-23 LAB — CBC WITH DIFFERENTIAL (CANCER CENTER ONLY)
Abs Immature Granulocytes: 0.01 10*3/uL (ref 0.00–0.07)
Basophils Absolute: 0 10*3/uL (ref 0.0–0.1)
Basophils Relative: 0 %
Eosinophils Absolute: 0.1 10*3/uL (ref 0.0–0.5)
Eosinophils Relative: 3 %
HCT: 34.9 % — ABNORMAL LOW (ref 39.0–52.0)
Hemoglobin: 11.2 g/dL — ABNORMAL LOW (ref 13.0–17.0)
Immature Granulocytes: 0 %
Lymphocytes Relative: 18 %
Lymphs Abs: 0.7 10*3/uL (ref 0.7–4.0)
MCH: 31 pg (ref 26.0–34.0)
MCHC: 32.1 g/dL (ref 30.0–36.0)
MCV: 96.7 fL (ref 80.0–100.0)
Monocytes Absolute: 0.4 10*3/uL (ref 0.1–1.0)
Monocytes Relative: 11 %
Neutro Abs: 2.8 10*3/uL (ref 1.7–7.7)
Neutrophils Relative %: 68 %
Platelet Count: 144 10*3/uL — ABNORMAL LOW (ref 150–400)
RBC: 3.61 MIL/uL — ABNORMAL LOW (ref 4.22–5.81)
RDW: 19.7 % — ABNORMAL HIGH (ref 11.5–15.5)
WBC Count: 4.1 10*3/uL (ref 4.0–10.5)
nRBC: 0 % (ref 0.0–0.2)

## 2018-11-23 LAB — CMP (CANCER CENTER ONLY)
ALT: 8 U/L (ref 0–44)
AST: 13 U/L — ABNORMAL LOW (ref 15–41)
Albumin: 3.8 g/dL (ref 3.5–5.0)
Alkaline Phosphatase: 49 U/L (ref 38–126)
Anion gap: 7 (ref 5–15)
BUN: 20 mg/dL (ref 8–23)
CO2: 29 mmol/L (ref 22–32)
Calcium: 9.9 mg/dL (ref 8.9–10.3)
Chloride: 101 mmol/L (ref 98–111)
Creatinine: 1.12 mg/dL (ref 0.61–1.24)
GFR, Est AFR Am: >60 mL/min (ref >60)
GFR, Estimated: >60 mL/min (ref >60)
Glucose, Bld: 91 mg/dL (ref 70–99)
Potassium: 3.5 mmol/L (ref 3.5–5.1)
Sodium: 137 mmol/L (ref 135–145)
Total Bilirubin: 0.7 mg/dL (ref 0.3–1.2)
Total Protein: 6.4 g/dL — ABNORMAL LOW (ref 6.5–8.1)

## 2018-11-23 LAB — SAMPLE TO BLOOD BANK

## 2018-11-23 MED ORDER — DIPHENHYDRAMINE HCL 25 MG PO CAPS
50.0000 mg | ORAL_CAPSULE | Freq: Once | ORAL | Status: AC
  Administered 2018-11-23: 50 mg via ORAL

## 2018-11-23 MED ORDER — METHYLPREDNISOLONE SODIUM SUCC 125 MG IJ SOLR
INTRAMUSCULAR | Status: AC
  Filled 2018-11-23: qty 2

## 2018-11-23 MED ORDER — ACETAMINOPHEN 325 MG PO TABS
650.0000 mg | ORAL_TABLET | Freq: Once | ORAL | Status: AC
  Administered 2018-11-23: 650 mg via ORAL

## 2018-11-23 MED ORDER — ACETAMINOPHEN 325 MG PO TABS
ORAL_TABLET | ORAL | Status: AC
  Filled 2018-11-23: qty 2

## 2018-11-23 MED ORDER — SODIUM CHLORIDE 0.9 % IV SOLN
16.2000 mg/kg | Freq: Once | INTRAVENOUS | Status: AC
  Administered 2018-11-23: 1600 mg via INTRAVENOUS
  Filled 2018-11-23: qty 80

## 2018-11-23 MED ORDER — DIPHENHYDRAMINE HCL 25 MG PO CAPS
ORAL_CAPSULE | ORAL | Status: AC
  Filled 2018-11-23: qty 2

## 2018-11-23 MED ORDER — HEPARIN SOD (PORK) LOCK FLUSH 100 UNIT/ML IV SOLN
500.0000 [IU] | Freq: Once | INTRAVENOUS | Status: AC | PRN
  Administered 2018-11-23: 500 [IU]
  Filled 2018-11-23: qty 5

## 2018-11-23 MED ORDER — SODIUM CHLORIDE 0.9% FLUSH
10.0000 mL | INTRAVENOUS | Status: DC | PRN
  Administered 2018-11-23: 10 mL
  Filled 2018-11-23: qty 10

## 2018-11-23 MED ORDER — METHYLPREDNISOLONE SODIUM SUCC 125 MG IJ SOLR
100.0000 mg | Freq: Once | INTRAMUSCULAR | Status: AC
  Administered 2018-11-23: 100 mg via INTRAVENOUS

## 2018-11-23 MED ORDER — SODIUM CHLORIDE 0.9 % IV SOLN
Freq: Once | INTRAVENOUS | Status: AC
  Administered 2018-11-23: 09:00:00 via INTRAVENOUS
  Filled 2018-11-23: qty 250

## 2018-11-23 NOTE — Patient Instructions (Signed)

## 2018-11-23 NOTE — Patient Instructions (Signed)
Daratumumab injection What is this medicine? DARATUMUMAB (dar a toom ue mab) is a monoclonal antibody. It is used to treat multiple myeloma. This medicine may be used for other purposes; ask your health care provider or pharmacist if you have questions. COMMON BRAND NAME(S): DARZALEX What should I tell my health care provider before I take this medicine? They need to know if you have any of these conditions:  infection (especially a virus infection such as chickenpox, herpes, or hepatitis B virus)  lung or breathing disease  an unusual or allergic reaction to daratumumab, other medicines, foods, dyes, or preservatives  pregnant or trying to get pregnant  breast-feeding How should I use this medicine? This medicine is for infusion into a vein. It is given by a health care professional in a hospital or clinic setting. Talk to your pediatrician regarding the use of this medicine in children. Special care may be needed. Overdosage: If you think you have taken too much of this medicine contact a poison control center or emergency room at once. NOTE: This medicine is only for you. Do not share this medicine with others. What if I miss a dose? Keep appointments for follow-up doses as directed. It is important not to miss your dose. Call your doctor or health care professional if you are unable to keep an appointment. What may interact with this medicine? Interactions have not been studied. This list may not describe all possible interactions. Give your health care provider a list of all the medicines, herbs, non-prescription drugs, or dietary supplements you use. Also tell them if you smoke, drink alcohol, or use illegal drugs. Some items may interact with your medicine. What should I watch for while using this medicine? This drug may make you feel generally unwell. Report any side effects. Continue your course of treatment even though you feel ill unless your doctor tells you to stop. This  medicine can cause serious allergic reactions. To reduce your risk you may need to take medicine before treatment with this medicine. Take your medicine as directed. This medicine can affect the results of blood tests to match your blood type. These changes can last for up to 6 months after the final dose. Your healthcare provider will do blood tests to match your blood type before you start treatment. Tell all of your healthcare providers that you are being treated with this medicine before receiving a blood transfusion. This medicine can affect the results of some tests used to determine treatment response; extra tests may be needed to evaluate response. Do not become pregnant while taking this medicine or for 3 months after stopping it. Women should inform their doctor if they wish to become pregnant or think they might be pregnant. There is a potential for serious side effects to an unborn child. Talk to your health care professional or pharmacist for more information. What side effects may I notice from receiving this medicine? Side effects that you should report to your doctor or health care professional as soon as possible:  allergic reactions like skin rash, itching or hives, swelling of the face, lips, or tongue  breathing problems  chills  cough  dizziness  feeling faint or lightheaded  headache  low blood counts - this medicine may decrease the number of white blood cells, red blood cells and platelets. You may be at increased risk for infections and bleeding.  nausea, vomiting  shortness of breath  signs of decreased platelets or bleeding - bruising, pinpoint red spots on  the skin, black, tarry stools, blood in the urine  signs of decreased red blood cells - unusually weak or tired, feeling faint or lightheaded, falls  signs of infection - fever or chills, cough, sore throat, pain or difficulty passing urine  signs and symptoms of liver injury like dark yellow or brown  urine; general ill feeling or flu-like symptoms; light-colored stools; loss of appetite; right upper belly pain; unusually weak or tired; yellowing of the eyes or skin Side effects that usually do not require medical attention (report to your doctor or health care professional if they continue or are bothersome):  back pain  constipation  loss of appetite  diarrhea  joint pain  muscle cramps  pain, tingling, numbness in the hands or feet  swelling of the ankles, feet, hands  tiredness  trouble sleeping This list may not describe all possible side effects. Call your doctor for medical advice about side effects. You may report side effects to FDA at 1-800-FDA-1088. Where should I keep my medicine? Keep out of the reach of children. This drug is given in a hospital or clinic and will not be stored at home. NOTE: This sheet is a summary. It may not cover all possible information. If you have questions about this medicine, talk to your doctor, pharmacist, or health care provider.  2020 Elsevier/Gold Standard (2018-02-24 14:00:48)

## 2018-11-29 ENCOUNTER — Other Ambulatory Visit: Payer: Self-pay | Admitting: *Deleted

## 2018-11-29 DIAGNOSIS — C9002 Multiple myeloma in relapse: Secondary | ICD-10-CM

## 2018-11-29 DIAGNOSIS — D631 Anemia in chronic kidney disease: Secondary | ICD-10-CM

## 2018-11-30 ENCOUNTER — Inpatient Hospital Stay: Payer: Medicare Other

## 2018-11-30 ENCOUNTER — Other Ambulatory Visit: Payer: Self-pay

## 2018-11-30 VITALS — BP 198/84 | HR 56 | Temp 97.3°F | Resp 17

## 2018-11-30 DIAGNOSIS — Z5112 Encounter for antineoplastic immunotherapy: Secondary | ICD-10-CM | POA: Diagnosis not present

## 2018-11-30 DIAGNOSIS — Z79899 Other long term (current) drug therapy: Secondary | ICD-10-CM | POA: Diagnosis not present

## 2018-11-30 DIAGNOSIS — Z9484 Stem cells transplant status: Secondary | ICD-10-CM | POA: Diagnosis not present

## 2018-11-30 DIAGNOSIS — D631 Anemia in chronic kidney disease: Secondary | ICD-10-CM

## 2018-11-30 DIAGNOSIS — C9002 Multiple myeloma in relapse: Secondary | ICD-10-CM

## 2018-11-30 DIAGNOSIS — D630 Anemia in neoplastic disease: Secondary | ICD-10-CM | POA: Diagnosis not present

## 2018-11-30 DIAGNOSIS — N183 Chronic kidney disease, stage 3 unspecified: Secondary | ICD-10-CM

## 2018-11-30 LAB — CBC WITH DIFFERENTIAL (CANCER CENTER ONLY)
Abs Immature Granulocytes: 0.02 10*3/uL (ref 0.00–0.07)
Basophils Absolute: 0 10*3/uL (ref 0.0–0.1)
Basophils Relative: 0 %
Eosinophils Absolute: 0.1 10*3/uL (ref 0.0–0.5)
Eosinophils Relative: 2 %
HCT: 33.9 % — ABNORMAL LOW (ref 39.0–52.0)
Hemoglobin: 10.8 g/dL — ABNORMAL LOW (ref 13.0–17.0)
Immature Granulocytes: 1 %
Lymphocytes Relative: 15 %
Lymphs Abs: 0.6 10*3/uL — ABNORMAL LOW (ref 0.7–4.0)
MCH: 31.1 pg (ref 26.0–34.0)
MCHC: 31.9 g/dL (ref 30.0–36.0)
MCV: 97.7 fL (ref 80.0–100.0)
Monocytes Absolute: 0.4 10*3/uL (ref 0.1–1.0)
Monocytes Relative: 9 %
Neutro Abs: 3.1 10*3/uL (ref 1.7–7.7)
Neutrophils Relative %: 73 %
Platelet Count: 124 10*3/uL — ABNORMAL LOW (ref 150–400)
RBC: 3.47 MIL/uL — ABNORMAL LOW (ref 4.22–5.81)
RDW: 19.5 % — ABNORMAL HIGH (ref 11.5–15.5)
WBC Count: 4.2 10*3/uL (ref 4.0–10.5)
nRBC: 0 % (ref 0.0–0.2)

## 2018-11-30 LAB — CMP (CANCER CENTER ONLY)
ALT: 11 U/L (ref 0–44)
AST: 14 U/L — ABNORMAL LOW (ref 15–41)
Albumin: 3.7 g/dL (ref 3.5–5.0)
Alkaline Phosphatase: 43 U/L (ref 38–126)
Anion gap: 6 (ref 5–15)
BUN: 16 mg/dL (ref 8–23)
CO2: 28 mmol/L (ref 22–32)
Calcium: 8.6 mg/dL — ABNORMAL LOW (ref 8.9–10.3)
Chloride: 103 mmol/L (ref 98–111)
Creatinine: 0.87 mg/dL (ref 0.61–1.24)
GFR, Est AFR Am: >60 mL/min (ref >60)
GFR, Estimated: >60 mL/min (ref >60)
Glucose, Bld: 105 mg/dL — ABNORMAL HIGH (ref 70–99)
Potassium: 3.5 mmol/L (ref 3.5–5.1)
Sodium: 137 mmol/L (ref 135–145)
Total Bilirubin: 0.8 mg/dL (ref 0.3–1.2)
Total Protein: 6 g/dL — ABNORMAL LOW (ref 6.5–8.1)

## 2018-11-30 MED ORDER — SODIUM CHLORIDE 0.9 % IV SOLN
16.2000 mg/kg | Freq: Once | INTRAVENOUS | Status: AC
  Administered 2018-11-30: 1600 mg via INTRAVENOUS
  Filled 2018-11-30: qty 80

## 2018-11-30 MED ORDER — ACETAMINOPHEN 325 MG PO TABS
650.0000 mg | ORAL_TABLET | Freq: Once | ORAL | Status: AC
  Administered 2018-11-30: 650 mg via ORAL

## 2018-11-30 MED ORDER — SODIUM CHLORIDE 0.9% FLUSH
10.0000 mL | INTRAVENOUS | Status: DC | PRN
  Administered 2018-11-30: 10 mL
  Filled 2018-11-30: qty 10

## 2018-11-30 MED ORDER — ACETAMINOPHEN 325 MG PO TABS
ORAL_TABLET | ORAL | Status: AC
  Filled 2018-11-30: qty 2

## 2018-11-30 MED ORDER — SODIUM CHLORIDE 0.9 % IV SOLN
Freq: Once | INTRAVENOUS | Status: AC
  Administered 2018-11-30: 09:00:00 via INTRAVENOUS
  Filled 2018-11-30: qty 250

## 2018-11-30 MED ORDER — DIPHENHYDRAMINE HCL 25 MG PO CAPS
50.0000 mg | ORAL_CAPSULE | Freq: Once | ORAL | Status: AC
  Administered 2018-11-30: 50 mg via ORAL

## 2018-11-30 MED ORDER — METHYLPREDNISOLONE SODIUM SUCC 125 MG IJ SOLR
INTRAMUSCULAR | Status: AC
  Filled 2018-11-30: qty 2

## 2018-11-30 MED ORDER — DIPHENHYDRAMINE HCL 25 MG PO CAPS
ORAL_CAPSULE | ORAL | Status: AC
  Filled 2018-11-30: qty 2

## 2018-11-30 MED ORDER — METHYLPREDNISOLONE SODIUM SUCC 125 MG IJ SOLR
100.0000 mg | Freq: Once | INTRAMUSCULAR | Status: AC
  Administered 2018-11-30: 100 mg via INTRAVENOUS

## 2018-11-30 MED ORDER — HEPARIN SOD (PORK) LOCK FLUSH 100 UNIT/ML IV SOLN
500.0000 [IU] | Freq: Once | INTRAVENOUS | Status: AC | PRN
  Administered 2018-11-30: 500 [IU]
  Filled 2018-11-30: qty 5

## 2018-11-30 NOTE — Patient Instructions (Signed)
Daratumumab injection What is this medicine? DARATUMUMAB (dar a toom ue mab) is a monoclonal antibody. It is used to treat multiple myeloma. This medicine may be used for other purposes; ask your health care provider or pharmacist if you have questions. COMMON BRAND NAME(S): DARZALEX What should I tell my health care provider before I take this medicine? They need to know if you have any of these conditions:  infection (especially a virus infection such as chickenpox, herpes, or hepatitis B virus)  lung or breathing disease  an unusual or allergic reaction to daratumumab, other medicines, foods, dyes, or preservatives  pregnant or trying to get pregnant  breast-feeding How should I use this medicine? This medicine is for infusion into a vein. It is given by a health care professional in a hospital or clinic setting. Talk to your pediatrician regarding the use of this medicine in children. Special care may be needed. Overdosage: If you think you have taken too much of this medicine contact a poison control center or emergency room at once. NOTE: This medicine is only for you. Do not share this medicine with others. What if I miss a dose? Keep appointments for follow-up doses as directed. It is important not to miss your dose. Call your doctor or health care professional if you are unable to keep an appointment. What may interact with this medicine? Interactions have not been studied. This list may not describe all possible interactions. Give your health care provider a list of all the medicines, herbs, non-prescription drugs, or dietary supplements you use. Also tell them if you smoke, drink alcohol, or use illegal drugs. Some items may interact with your medicine. What should I watch for while using this medicine? This drug may make you feel generally unwell. Report any side effects. Continue your course of treatment even though you feel ill unless your doctor tells you to stop. This  medicine can cause serious allergic reactions. To reduce your risk you may need to take medicine before treatment with this medicine. Take your medicine as directed. This medicine can affect the results of blood tests to match your blood type. These changes can last for up to 6 months after the final dose. Your healthcare provider will do blood tests to match your blood type before you start treatment. Tell all of your healthcare providers that you are being treated with this medicine before receiving a blood transfusion. This medicine can affect the results of some tests used to determine treatment response; extra tests may be needed to evaluate response. Do not become pregnant while taking this medicine or for 3 months after stopping it. Women should inform their doctor if they wish to become pregnant or think they might be pregnant. There is a potential for serious side effects to an unborn child. Talk to your health care professional or pharmacist for more information. What side effects may I notice from receiving this medicine? Side effects that you should report to your doctor or health care professional as soon as possible:  allergic reactions like skin rash, itching or hives, swelling of the face, lips, or tongue  breathing problems  chills  cough  dizziness  feeling faint or lightheaded  headache  low blood counts - this medicine may decrease the number of white blood cells, red blood cells and platelets. You may be at increased risk for infections and bleeding.  nausea, vomiting  shortness of breath  signs of decreased platelets or bleeding - bruising, pinpoint red spots on  the skin, black, tarry stools, blood in the urine  signs of decreased red blood cells - unusually weak or tired, feeling faint or lightheaded, falls  signs of infection - fever or chills, cough, sore throat, pain or difficulty passing urine  signs and symptoms of liver injury like dark yellow or brown  urine; general ill feeling or flu-like symptoms; light-colored stools; loss of appetite; right upper belly pain; unusually weak or tired; yellowing of the eyes or skin Side effects that usually do not require medical attention (report to your doctor or health care professional if they continue or are bothersome):  back pain  constipation  loss of appetite  diarrhea  joint pain  muscle cramps  pain, tingling, numbness in the hands or feet  swelling of the ankles, feet, hands  tiredness  trouble sleeping This list may not describe all possible side effects. Call your doctor for medical advice about side effects. You may report side effects to FDA at 1-800-FDA-1088. Where should I keep my medicine? Keep out of the reach of children. This drug is given in a hospital or clinic and will not be stored at home. NOTE: This sheet is a summary. It may not cover all possible information. If you have questions about this medicine, talk to your doctor, pharmacist, or health care provider.  2020 Elsevier/Gold Standard (2018-02-24 14:00:48)

## 2018-11-30 NOTE — Progress Notes (Signed)
High BP at 1310 rate bump. Dr. Marin Olp aware and instructed patient to take clonidine HCL 0.1 mg ( patient brought medication from home). Pt. Took the clonidine at 1315.

## 2018-11-30 NOTE — Patient Instructions (Signed)

## 2018-12-06 ENCOUNTER — Other Ambulatory Visit: Payer: Self-pay | Admitting: Family

## 2018-12-06 DIAGNOSIS — C9002 Multiple myeloma in relapse: Secondary | ICD-10-CM

## 2018-12-07 ENCOUNTER — Inpatient Hospital Stay: Payer: Medicare Other

## 2018-12-07 ENCOUNTER — Other Ambulatory Visit: Payer: Self-pay

## 2018-12-07 VITALS — BP 209/82 | HR 58 | Resp 18 | Wt 198.8 lb

## 2018-12-07 DIAGNOSIS — Z9484 Stem cells transplant status: Secondary | ICD-10-CM | POA: Diagnosis not present

## 2018-12-07 DIAGNOSIS — Z5112 Encounter for antineoplastic immunotherapy: Secondary | ICD-10-CM | POA: Diagnosis not present

## 2018-12-07 DIAGNOSIS — Z79899 Other long term (current) drug therapy: Secondary | ICD-10-CM | POA: Diagnosis not present

## 2018-12-07 DIAGNOSIS — C9002 Multiple myeloma in relapse: Secondary | ICD-10-CM

## 2018-12-07 DIAGNOSIS — D630 Anemia in neoplastic disease: Secondary | ICD-10-CM | POA: Diagnosis not present

## 2018-12-07 LAB — CBC WITH DIFFERENTIAL (CANCER CENTER ONLY)
Abs Immature Granulocytes: 0.01 10*3/uL (ref 0.00–0.07)
Basophils Absolute: 0 10*3/uL (ref 0.0–0.1)
Basophils Relative: 0 %
Eosinophils Absolute: 0.1 10*3/uL (ref 0.0–0.5)
Eosinophils Relative: 2 %
HCT: 34.3 % — ABNORMAL LOW (ref 39.0–52.0)
Hemoglobin: 11.1 g/dL — ABNORMAL LOW (ref 13.0–17.0)
Immature Granulocytes: 0 %
Lymphocytes Relative: 19 %
Lymphs Abs: 0.6 10*3/uL — ABNORMAL LOW (ref 0.7–4.0)
MCH: 31.3 pg (ref 26.0–34.0)
MCHC: 32.4 g/dL (ref 30.0–36.0)
MCV: 96.6 fL (ref 80.0–100.0)
Monocytes Absolute: 0.4 10*3/uL (ref 0.1–1.0)
Monocytes Relative: 11 %
Neutro Abs: 2.3 10*3/uL (ref 1.7–7.7)
Neutrophils Relative %: 68 %
Platelet Count: 157 10*3/uL (ref 150–400)
RBC: 3.55 MIL/uL — ABNORMAL LOW (ref 4.22–5.81)
RDW: 18.8 % — ABNORMAL HIGH (ref 11.5–15.5)
WBC Count: 3.3 10*3/uL — ABNORMAL LOW (ref 4.0–10.5)
nRBC: 0 % (ref 0.0–0.2)

## 2018-12-07 LAB — CMP (CANCER CENTER ONLY)
ALT: 10 U/L (ref 0–44)
AST: 13 U/L — ABNORMAL LOW (ref 15–41)
Albumin: 3.7 g/dL (ref 3.5–5.0)
Alkaline Phosphatase: 45 U/L (ref 38–126)
Anion gap: 6 (ref 5–15)
BUN: 16 mg/dL (ref 8–23)
CO2: 28 mmol/L (ref 22–32)
Calcium: 8.5 mg/dL — ABNORMAL LOW (ref 8.9–10.3)
Chloride: 103 mmol/L (ref 98–111)
Creatinine: 1.04 mg/dL (ref 0.61–1.24)
GFR, Est AFR Am: >60 mL/min (ref >60)
GFR, Estimated: >60 mL/min (ref >60)
Glucose, Bld: 94 mg/dL (ref 70–99)
Potassium: 3.5 mmol/L (ref 3.5–5.1)
Sodium: 137 mmol/L (ref 135–145)
Total Bilirubin: 0.7 mg/dL (ref 0.3–1.2)
Total Protein: 6 g/dL — ABNORMAL LOW (ref 6.5–8.1)

## 2018-12-07 LAB — SAMPLE TO BLOOD BANK

## 2018-12-07 MED ORDER — DIPHENHYDRAMINE HCL 25 MG PO CAPS
ORAL_CAPSULE | ORAL | Status: AC
  Filled 2018-12-07: qty 2

## 2018-12-07 MED ORDER — ACETAMINOPHEN 325 MG PO TABS
ORAL_TABLET | ORAL | Status: AC
  Filled 2018-12-07: qty 2

## 2018-12-07 MED ORDER — SODIUM CHLORIDE 0.9 % IV SOLN
Freq: Once | INTRAVENOUS | Status: AC
  Administered 2018-12-07: 10:00:00 via INTRAVENOUS
  Filled 2018-12-07: qty 250

## 2018-12-07 MED ORDER — SODIUM CHLORIDE 0.9 % IV SOLN
1500.0000 mg | Freq: Once | INTRAVENOUS | Status: AC
  Administered 2018-12-07: 11:00:00 1500 mg via INTRAVENOUS
  Filled 2018-12-07: qty 15

## 2018-12-07 MED ORDER — SODIUM CHLORIDE 0.9% FLUSH
10.0000 mL | INTRAVENOUS | Status: DC | PRN
  Administered 2018-12-07: 10 mL
  Filled 2018-12-07: qty 10

## 2018-12-07 MED ORDER — METHYLPREDNISOLONE SODIUM SUCC 125 MG IJ SOLR
INTRAMUSCULAR | Status: AC
  Filled 2018-12-07: qty 2

## 2018-12-07 MED ORDER — HEPARIN SOD (PORK) LOCK FLUSH 100 UNIT/ML IV SOLN
500.0000 [IU] | Freq: Once | INTRAVENOUS | Status: AC | PRN
  Administered 2018-12-07: 500 [IU]
  Filled 2018-12-07: qty 5

## 2018-12-07 MED ORDER — DIPHENHYDRAMINE HCL 25 MG PO CAPS
50.0000 mg | ORAL_CAPSULE | Freq: Once | ORAL | Status: AC
  Administered 2018-12-07: 10:00:00 50 mg via ORAL

## 2018-12-07 MED ORDER — ACETAMINOPHEN 325 MG PO TABS
650.0000 mg | ORAL_TABLET | Freq: Once | ORAL | Status: AC
  Administered 2018-12-07: 10:00:00 650 mg via ORAL

## 2018-12-07 MED ORDER — METHYLPREDNISOLONE SODIUM SUCC 125 MG IJ SOLR
100.0000 mg | Freq: Once | INTRAMUSCULAR | Status: AC
  Administered 2018-12-07: 100 mg via INTRAVENOUS

## 2018-12-07 NOTE — Patient Instructions (Signed)
[facility] Discharge Instructions for Patients Receiving Chemotherapy  Today you received the following chemotherapy agents Darzalex.  To help prevent nausea and vomiting after your treatment, we encourage you to take your nausea medication as directed. IF you develop nausea and vomiting that is not controlled by your nausea medication, call the clinic.   BELOW ARE SYMPTOMS THAT SHOULD BE REPORTED IMMEDIATELY:  *FEVER GREATER THAN 100.5 F  *CHILLS WITH OR WITHOUT FEVER  NAUSEA AND VOMITING THAT IS NOT CONTROLLED WITH YOUR NAUSEA MEDICATION  *UNUSUAL SHORTNESS OF BREATH  *UNUSUAL BRUISING OR BLEEDING  TENDERNESS IN MOUTH AND THROAT WITH OR WITHOUT PRESENCE OF ULCERS  *URINARY PROBLEMS  *BOWEL PROBLEMS  UNUSUAL RASH Items with * indicate a potential emergency and should be followed up as soon as possible.  Feel free to call the clinic you have any questions or concerns. The clinic phone number is [phone].  Please show the [facility] at check-in to the Emergency Department and triage nurse.

## 2018-12-07 NOTE — Progress Notes (Signed)
Patient has not had any symptoms of HTN all day - Sarah NP aware of his readings and he took a second Clonidine 0.1mg  of his own at 1425 prior to leaving.  He stated he does use a meter at home to check his BP and "it is never this high at home".  He knows to call his PCP or cardio if this continues. He did take his other HTN med this am prior to arrival.

## 2018-12-07 NOTE — Progress Notes (Signed)
Patient has had weight loss. Okay to reduce Darzalex dose to reflect current weight per Laverna Peace, NP.

## 2018-12-14 ENCOUNTER — Encounter: Payer: Self-pay | Admitting: Hematology & Oncology

## 2018-12-14 ENCOUNTER — Other Ambulatory Visit: Payer: Self-pay

## 2018-12-14 ENCOUNTER — Inpatient Hospital Stay: Payer: Medicare Other

## 2018-12-14 ENCOUNTER — Telehealth: Payer: Self-pay | Admitting: Hematology & Oncology

## 2018-12-14 ENCOUNTER — Inpatient Hospital Stay (HOSPITAL_BASED_OUTPATIENT_CLINIC_OR_DEPARTMENT_OTHER): Payer: Medicare Other | Admitting: Hematology & Oncology

## 2018-12-14 VITALS — BP 180/94 | HR 60 | Temp 97.8°F | Resp 18

## 2018-12-14 VITALS — BP 205/103 | HR 65 | Temp 97.5°F | Resp 20 | Wt 204.0 lb

## 2018-12-14 DIAGNOSIS — C9000 Multiple myeloma not having achieved remission: Secondary | ICD-10-CM

## 2018-12-14 DIAGNOSIS — C9002 Multiple myeloma in relapse: Secondary | ICD-10-CM

## 2018-12-14 DIAGNOSIS — Z9484 Stem cells transplant status: Secondary | ICD-10-CM

## 2018-12-14 DIAGNOSIS — D630 Anemia in neoplastic disease: Secondary | ICD-10-CM | POA: Diagnosis not present

## 2018-12-14 DIAGNOSIS — Z5112 Encounter for antineoplastic immunotherapy: Secondary | ICD-10-CM | POA: Diagnosis not present

## 2018-12-14 DIAGNOSIS — Z79899 Other long term (current) drug therapy: Secondary | ICD-10-CM | POA: Diagnosis not present

## 2018-12-14 LAB — CBC WITH DIFFERENTIAL (CANCER CENTER ONLY)
Abs Immature Granulocytes: 0.02 10*3/uL (ref 0.00–0.07)
Basophils Absolute: 0 10*3/uL (ref 0.0–0.1)
Basophils Relative: 0 %
Eosinophils Absolute: 0.1 10*3/uL (ref 0.0–0.5)
Eosinophils Relative: 1 %
HCT: 32.9 % — ABNORMAL LOW (ref 39.0–52.0)
Hemoglobin: 10.9 g/dL — ABNORMAL LOW (ref 13.0–17.0)
Immature Granulocytes: 0 %
Lymphocytes Relative: 13 %
Lymphs Abs: 0.6 10*3/uL — ABNORMAL LOW (ref 0.7–4.0)
MCH: 31.9 pg (ref 26.0–34.0)
MCHC: 33.1 g/dL (ref 30.0–36.0)
MCV: 96.2 fL (ref 80.0–100.0)
Monocytes Absolute: 0.4 10*3/uL (ref 0.1–1.0)
Monocytes Relative: 9 %
Neutro Abs: 3.7 10*3/uL (ref 1.7–7.7)
Neutrophils Relative %: 77 %
Platelet Count: 156 10*3/uL (ref 150–400)
RBC: 3.42 MIL/uL — ABNORMAL LOW (ref 4.22–5.81)
RDW: 18.4 % — ABNORMAL HIGH (ref 11.5–15.5)
WBC Count: 4.8 10*3/uL (ref 4.0–10.5)
nRBC: 0 % (ref 0.0–0.2)

## 2018-12-14 LAB — CMP (CANCER CENTER ONLY)
ALT: 12 U/L (ref 0–44)
AST: 14 U/L — ABNORMAL LOW (ref 15–41)
Albumin: 3.5 g/dL (ref 3.5–5.0)
Alkaline Phosphatase: 45 U/L (ref 38–126)
Anion gap: 8 (ref 5–15)
BUN: 14 mg/dL (ref 8–23)
CO2: 28 mmol/L (ref 22–32)
Calcium: 8.8 mg/dL — ABNORMAL LOW (ref 8.9–10.3)
Chloride: 100 mmol/L (ref 98–111)
Creatinine: 0.94 mg/dL (ref 0.61–1.24)
GFR, Est AFR Am: >60 mL/min (ref >60)
GFR, Estimated: >60 mL/min (ref >60)
Glucose, Bld: 112 mg/dL — ABNORMAL HIGH (ref 70–99)
Potassium: 3.4 mmol/L — ABNORMAL LOW (ref 3.5–5.1)
Sodium: 136 mmol/L (ref 135–145)
Total Bilirubin: 0.6 mg/dL (ref 0.3–1.2)
Total Protein: 5.5 g/dL — ABNORMAL LOW (ref 6.5–8.1)

## 2018-12-14 LAB — IRON AND TIBC
Iron: 104 ug/dL (ref 42–163)
Saturation Ratios: 46 % (ref 20–55)
TIBC: 225 ug/dL (ref 202–409)
UIBC: 121 ug/dL (ref 117–376)

## 2018-12-14 LAB — FERRITIN: Ferritin: 1413 ng/mL — ABNORMAL HIGH (ref 24–336)

## 2018-12-14 MED ORDER — SODIUM CHLORIDE 0.9% FLUSH
10.0000 mL | INTRAVENOUS | Status: DC | PRN
  Administered 2018-12-14: 10 mL
  Filled 2018-12-14: qty 10

## 2018-12-14 MED ORDER — HEPARIN SOD (PORK) LOCK FLUSH 100 UNIT/ML IV SOLN
500.0000 [IU] | Freq: Once | INTRAVENOUS | Status: AC | PRN
  Administered 2018-12-14: 500 [IU]
  Filled 2018-12-14: qty 5

## 2018-12-14 MED ORDER — DIPHENHYDRAMINE HCL 25 MG PO CAPS
50.0000 mg | ORAL_CAPSULE | Freq: Once | ORAL | Status: AC
  Administered 2018-12-14: 50 mg via ORAL

## 2018-12-14 MED ORDER — ACETAMINOPHEN 325 MG PO TABS
650.0000 mg | ORAL_TABLET | Freq: Once | ORAL | Status: AC
  Administered 2018-12-14: 650 mg via ORAL

## 2018-12-14 MED ORDER — METHYLPREDNISOLONE SODIUM SUCC 125 MG IJ SOLR
100.0000 mg | Freq: Once | INTRAMUSCULAR | Status: AC
  Administered 2018-12-14: 100 mg via INTRAVENOUS

## 2018-12-14 MED ORDER — ACETAMINOPHEN 325 MG PO TABS
ORAL_TABLET | ORAL | Status: AC
  Filled 2018-12-14: qty 2

## 2018-12-14 MED ORDER — DIPHENHYDRAMINE HCL 25 MG PO CAPS
ORAL_CAPSULE | ORAL | Status: AC
  Filled 2018-12-14: qty 2

## 2018-12-14 MED ORDER — SODIUM CHLORIDE 0.9 % IV SOLN
Freq: Once | INTRAVENOUS | Status: AC
  Administered 2018-12-14: 09:00:00 via INTRAVENOUS
  Filled 2018-12-14: qty 250

## 2018-12-14 MED ORDER — SODIUM CHLORIDE 0.9 % IV SOLN
1500.0000 mg | Freq: Once | INTRAVENOUS | Status: AC
  Administered 2018-12-14: 1500 mg via INTRAVENOUS
  Filled 2018-12-14: qty 60

## 2018-12-14 MED ORDER — METHYLPREDNISOLONE SODIUM SUCC 125 MG IJ SOLR
INTRAMUSCULAR | Status: AC
  Filled 2018-12-14: qty 2

## 2018-12-14 NOTE — Progress Notes (Signed)
Ok to treat with blood pressure 170/90 per Dr. Marin Olp.   Patient's blood pressure steadily increasing. Patient denies any vision problems, complaints or concerns. Dr. Marin Olp notified. Patient to take his home prescription of clonidine 0.1 mg one tablet now per Dr. Marin Olp. Patient verbalized understanding and took clonidine 0.1 mg PO at 1212 pm.

## 2018-12-14 NOTE — Patient Instructions (Signed)
[facility] Discharge Instructions for Patients Receiving Chemotherapy  Today you received the following chemotherapy agents:  Darzalex  To help prevent nausea and vomiting after your treatment, we encourage you to take your nausea medication as prescribed.   If you develop nausea and vomiting that is not controlled by your nausea medication, call the clinic.   BELOW ARE SYMPTOMS THAT SHOULD BE REPORTED IMMEDIATELY:  *FEVER GREATER THAN 100.5 F  *CHILLS WITH OR WITHOUT FEVER  NAUSEA AND VOMITING THAT IS NOT CONTROLLED WITH YOUR NAUSEA MEDICATION  *UNUSUAL SHORTNESS OF BREATH  *UNUSUAL BRUISING OR BLEEDING  TENDERNESS IN MOUTH AND THROAT WITH OR WITHOUT PRESENCE OF ULCERS  *URINARY PROBLEMS  *BOWEL PROBLEMS  UNUSUAL RASH Items with * indicate a potential emergency and should be followed up as soon as possible.  Feel free to call the clinic should you have any questions or concerns. The clinic phone number is [phone].  Please show the CHEMO ALERT CARD at check-in to the Emergency Department and triage nurse.   

## 2018-12-14 NOTE — Progress Notes (Signed)
Hematology and Oncology Follow Up Visit  Robert [name] [id number] 1933-10-31 85 y.o. 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 3 weeks for hemoglobin less than 10   Interim History:  Robert [name] is here today for follow-up.  So far, he is tolerated the daratumumab pretty well.  He really has had no problems with this.  As always, his blood pressure is quite high.  He says he is taking his blood pressure medication.  He is not taking the Lasix or Zaroxolyn.  He says the swelling in his legs is a lot better.  He is on clonidine 0.1 mg daily.  I told him to do this twice a day now.  He has had no headache.  He has had no problems with cough or shortness of breath.  There is no bleeding.  He has had no rashes.  There is been no nausea or vomiting.  We will have to see what his myeloma studies look like today.  Hopefully, they are better.  Currently, I would say performance status is ECOG 1.   Medications:  Allergies as of 12/14/2018   No Known Allergies     Medication List       Accurate as of December 14, 2018  8:14 AM. If you have any questions, ask your nurse or doctor.        aspirin 81 MG tablet Take 81 mg by mouth daily.   bimatoprost 0.01 % Soln Commonly known as: LUMIGAN Place 1 drop into both eyes at bedtime.   CALTRATE 600+D PLUS PO Take 1 tablet by mouth daily.   carvedilol 12.5 MG tablet Commonly known as: COREG Take 1 tablet (12.5 mg total) by mouth 2 (two) times daily with a meal.   cloNIDine 0.1 MG tablet Commonly known as: CATAPRES Take 1 tablet  (0.1 mg total) by mouth 2 (two) times daily. Take an extra tablet as needed for blood pressure greater than 170/100 What changed:   when to take this  reasons to take this   [employer] VITAMIN B12 2000 MCG tablet Generic drug: cyanocobalamin Take 2,000 mcg by mouth at bedtime.   famciclovir 250 MG tablet Commonly known as: FAMVIR Take 1 tablet (250 mg total) by mouth daily.   furosemide 20 MG tablet Commonly known as: LASIX Take 2 tablets (40 mg total) by mouth daily.   HCA LAX-X PO Take by mouth every three (3) days as needed.   hydrochlorothiazide 25 MG tablet Commonly known as: HYDRODIURIL 25 mg daily.   losartan 100 MG tablet Commonly known as: COZAAR Take 100 mg by mouth daily.   metolazone 5 MG tablet Commonly known as: ZAROXOLYN Take 1 pill 1 hour prior to taking Lasix daily   multivitamin with minerals Tabs tablet Take 1 tablet by mouth daily.   potassium chloride SA 20 MEQ tablet Commonly known as: K-DUR Take 20 mEq by mouth daily.   pravastatin 20 MG tablet Commonly known as: PRAVACHOL Take 20 mg by mouth at bedtime.   SYSTANE OP Place 1 drop into both eyes daily as  needed (dry eyes).   traMADol 50 MG tablet Commonly known as: ULTRAM Take 1 tablet (50 mg total) by mouth every 6 (six) hours as needed.   Vitamin D3 25 MCG (1000 UT) Caps Take 1,000 Units by mouth daily.   Zometa 4 MG/100ML IVPB Generic drug: Zoledronic Acid Inject 4 mg into the vein every 4 (four) months. Administered by Dr. Jonette Eva at [facility] - last infusion mid July 2019       Allergies: No Known Allergies  Past Medical History, Surgical history, Social history, and Family History were reviewed and updated.  Review of Systems: Review of Systems  Constitutional: Negative.   HENT: Negative.   Eyes: Negative.   Respiratory: Negative.   Cardiovascular: Positive for leg swelling.  Gastrointestinal: Positive for heartburn.  Genitourinary: Negative.    Musculoskeletal: Positive for joint pain.  Skin: Negative.   Neurological: Negative.   Endo/Heme/Allergies: Negative.   Psychiatric/Behavioral: Negative.      Physical Exam:  weight is 204 lb (92.5 kg). His oral temperature is 97.5 F (36.4 C) (abnormal). His blood pressure is 205/103 (abnormal) and his pulse is 65. His respiration is 20 and oxygen saturation is 100%.   Wt Readings from Last 3 Encounters:  12/14/18 204 lb (92.5 kg)  12/07/18 198 lb 12 oz (90.2 kg)  11/16/18 204 lb (92.5 kg)    Physical Exam Vitals signs reviewed.  HENT:     Head: Normocephalic and atraumatic.  Eyes:     Pupils: Pupils are equal, round, and reactive to light.  Neck:     Musculoskeletal: Normal range of motion.  Cardiovascular:     Rate and Rhythm: Normal rate and regular rhythm.     Heart sounds: Normal heart sounds.  Pulmonary:     Effort: Pulmonary effort is normal.     Breath sounds: Normal breath sounds.  Abdominal:     General: Bowel sounds are normal.     Palpations: Abdomen is soft.     Comments: Abdominal exam is obese but soft.  He has good bowel sounds.  There is no fluid wave.  There is no palpable liver or spleen tip.  Musculoskeletal: Normal range of motion.        General: No tenderness or deformity.     Comments: Extremities shows 1+ edema in his lower legs and feet.  This is mildly pitting.  He has decent range of motion of his joints.  He has decent strength.  Lymphadenopathy:     Cervical: No cervical adenopathy.  Skin:    General: Skin is warm and dry.     Findings: No erythema or rash.  Neurological:     Mental Status: He is alert and oriented to person, place, and time.  Psychiatric:        Behavior: Behavior normal.        Thought Content: Thought content normal.        Judgment: Judgment normal.      Lab Results  Component Value Date   WBC 3.3 (L) 12/07/2018   HGB 11.1 (L) 12/07/2018   HCT 34.3 (L) 12/07/2018   MCV 96.6 12/07/2018   PLT 157 12/07/2018    Lab Results  Component Value Date   FERRITIN 2,026 (H) 11/16/2018   IRON 104 11/16/2018   TIBC 243 11/16/2018   UIBC 138 11/16/2018   IRONPCTSAT 43 11/16/2018   Lab Results  Component Value Date   RETICCTPCT 1.0 11/16/2018   RBC 3.55 (L) 12/07/2018  Lab Results  Component Value Date   KPAFRELGTCHN 51.6 (H) 11/16/2018   LAMBDASER 17.3 11/16/2018   KAPLAMBRATIO 2.98 (H) 11/16/2018   Lab Results  Component Value Date   IGGSERUM 1,607 11/16/2018   IGGSERUM 1,741 (H) 11/16/2018   IGA 64 11/16/2018   IGA 65 11/16/2018   IGMSERUM 63 11/16/2018   IGMSERUM 71 11/16/2018   Lab Results  Component Value Date   TOTALPROTELP 6.7 11/16/2018   ALBUMINELP 3.6 11/16/2018   A1GS 0.2 11/16/2018   A2GS 0.7 11/16/2018   BETS 0.8 11/16/2018   BETA2SER 0.3 01/03/2015   GAMS 1.5 11/16/2018   MSPIKE 0.7 (H) 11/16/2018   SPEI Comment 10/20/2018     Chemistry      Component Value Date/Time   NA 137 12/07/2018 0820   NA 137 04/08/2017 1141   NA 138 10/29/2015 1029   K 3.5 12/07/2018 0820   K 3.4 04/08/2017 1141   K 3.9 10/29/2015 1029   CL 103 12/07/2018 0820   CL 102 04/08/2017 1141   CO2 28 12/07/2018 0820   CO2 30 04/08/2017 1141   CO2 27 10/29/2015 1029   BUN 16 12/07/2018 0820   BUN 16 04/08/2017 1141   BUN 13.8 10/29/2015 1029   CREATININE 1.04 12/07/2018 0820   CREATININE 1.3 (H) 04/08/2017 1141   CREATININE 1.1 10/29/2015 1029      Component Value Date/Time   CALCIUM 8.5 (L) 12/07/2018 0820   CALCIUM 9.3 04/08/2017 1141   CALCIUM 9.3 10/29/2015 1029   ALKPHOS 45 12/07/2018 0820   ALKPHOS 66 04/08/2017 1141   ALKPHOS 52 10/29/2015 1029   AST 13 (L) 12/07/2018 0820   AST 20 10/29/2015 1029   ALT 10 12/07/2018 0820   ALT 21 04/08/2017 1141   ALT 14 10/29/2015 1029   BILITOT 0.7 12/07/2018 0820   BILITOT 0.75 10/29/2015 1029       Impression and Plan: Robert [name] is a very pleasant 85 yo African American gentleman with relapsedIgG kappamyeloma.He has a  history of stem cell transplant back in 2006.  I am glad to see that his blood counts are looking much better.  I think the daratumumab is a factor with this.  Again, we will have to see what his myeloma studies look like.  This will really be important.  Hopefully, his blood pressure will get under better control.  We will plan to see him back in another month  Hopefully we will be able to use the subcutaneous version of daratumumab in the near future.    Volanda Napoleon, MD 7/22/20208:14 AM

## 2018-12-14 NOTE — Telephone Encounter (Signed)
Appointments scheduled avs/calendar printed per 7/22 los

## 2018-12-15 LAB — KAPPA/LAMBDA LIGHT CHAINS
Kappa free light chain: 8.2 mg/L (ref 3.3–19.4)
Kappa, lambda light chain ratio: 2 — ABNORMAL HIGH (ref 0.26–1.65)
Lambda free light chains: 4.1 mg/L — ABNORMAL LOW (ref 5.7–26.3)

## 2018-12-15 LAB — IGG, IGA, IGM
IgA: 19 mg/dL — ABNORMAL LOW (ref 61–437)
IgG (Immunoglobin G), Serum: 931 mg/dL (ref 603–1613)
IgM (Immunoglobulin M), Srm: 33 mg/dL (ref 15–143)

## 2018-12-16 LAB — IMMUNOFIXATION REFLEX, SERUM
IgA: 20 mg/dL — ABNORMAL LOW (ref 61–437)
IgG (Immunoglobin G), Serum: 893 mg/dL (ref 603–1613)
IgM (Immunoglobulin M), Srm: 35 mg/dL (ref 15–143)

## 2018-12-16 LAB — PROTEIN ELECTROPHORESIS, SERUM, WITH REFLEX
A/G Ratio: 1.4 (ref 0.7–1.7)
Albumin ELP: 3.2 g/dL (ref 2.9–4.4)
Alpha-1-Globulin: 0.2 g/dL (ref 0.0–0.4)
Alpha-2-Globulin: 0.7 g/dL (ref 0.4–1.0)
Beta Globulin: 0.6 g/dL — ABNORMAL LOW (ref 0.7–1.3)
Gamma Globulin: 0.8 g/dL (ref 0.4–1.8)
Globulin, Total: 2.3 g/dL (ref 2.2–3.9)
M-Spike, %: 0.3 g/dL — ABNORMAL HIGH
SPEP Interpretation: 0
Total Protein ELP: 5.5 g/dL — ABNORMAL LOW (ref 6.0–8.5)

## 2018-12-19 ENCOUNTER — Telehealth: Payer: Self-pay | Admitting: *Deleted

## 2018-12-19 NOTE — Telephone Encounter (Signed)
-----   Message from Volanda Napoleon, MD sent at 12/17/2018  7:49 AM EDT ----- Call - the myeloma is already down by 60%!!!  THIS NEW TREATMENT is working nicely! [name]

## 2018-12-19 NOTE — Telephone Encounter (Signed)
As noted below by Dr. Marin Olp, I informed the patient that the Myeloma is down by 60%. He verbalized understanding.

## 2018-12-21 ENCOUNTER — Inpatient Hospital Stay: Payer: Medicare Other

## 2018-12-21 ENCOUNTER — Other Ambulatory Visit: Payer: Self-pay

## 2018-12-21 VITALS — BP 222/92 | HR 62

## 2018-12-21 DIAGNOSIS — Z9484 Stem cells transplant status: Secondary | ICD-10-CM | POA: Diagnosis not present

## 2018-12-21 DIAGNOSIS — C9002 Multiple myeloma in relapse: Secondary | ICD-10-CM | POA: Diagnosis not present

## 2018-12-21 DIAGNOSIS — Z5112 Encounter for antineoplastic immunotherapy: Secondary | ICD-10-CM | POA: Diagnosis not present

## 2018-12-21 DIAGNOSIS — D630 Anemia in neoplastic disease: Secondary | ICD-10-CM | POA: Diagnosis not present

## 2018-12-21 DIAGNOSIS — Z79899 Other long term (current) drug therapy: Secondary | ICD-10-CM | POA: Diagnosis not present

## 2018-12-21 LAB — CBC WITH DIFFERENTIAL (CANCER CENTER ONLY)
Abs Immature Granulocytes: 0.01 10*3/uL (ref 0.00–0.07)
Basophils Absolute: 0 10*3/uL (ref 0.0–0.1)
Basophils Relative: 0 %
Eosinophils Absolute: 0 10*3/uL (ref 0.0–0.5)
Eosinophils Relative: 1 %
HCT: 33 % — ABNORMAL LOW (ref 39.0–52.0)
Hemoglobin: 10.9 g/dL — ABNORMAL LOW (ref 13.0–17.0)
Immature Granulocytes: 0 %
Lymphocytes Relative: 17 %
Lymphs Abs: 0.7 10*3/uL (ref 0.7–4.0)
MCH: 31.8 pg (ref 26.0–34.0)
MCHC: 33 g/dL (ref 30.0–36.0)
MCV: 96.2 fL (ref 80.0–100.0)
Monocytes Absolute: 0.5 10*3/uL (ref 0.1–1.0)
Monocytes Relative: 14 %
Neutro Abs: 2.7 10*3/uL (ref 1.7–7.7)
Neutrophils Relative %: 68 %
Platelet Count: 147 10*3/uL — ABNORMAL LOW (ref 150–400)
RBC: 3.43 MIL/uL — ABNORMAL LOW (ref 4.22–5.81)
RDW: 17.8 % — ABNORMAL HIGH (ref 11.5–15.5)
WBC Count: 4 10*3/uL (ref 4.0–10.5)
nRBC: 0 % (ref 0.0–0.2)

## 2018-12-21 LAB — CMP (CANCER CENTER ONLY)
ALT: 11 U/L (ref 0–44)
AST: 13 U/L — ABNORMAL LOW (ref 15–41)
Albumin: 3.5 g/dL (ref 3.5–5.0)
Alkaline Phosphatase: 50 U/L (ref 38–126)
Anion gap: 6 (ref 5–15)
BUN: 15 mg/dL (ref 8–23)
CO2: 28 mmol/L (ref 22–32)
Calcium: 8.6 mg/dL — ABNORMAL LOW (ref 8.9–10.3)
Chloride: 102 mmol/L (ref 98–111)
Creatinine: 0.99 mg/dL (ref 0.61–1.24)
GFR, Est AFR Am: >60 mL/min (ref >60)
GFR, Estimated: >60 mL/min (ref >60)
Glucose, Bld: 107 mg/dL — ABNORMAL HIGH (ref 70–99)
Potassium: 3.7 mmol/L (ref 3.5–5.1)
Sodium: 136 mmol/L (ref 135–145)
Total Bilirubin: 0.7 mg/dL (ref 0.3–1.2)
Total Protein: 5.6 g/dL — ABNORMAL LOW (ref 6.5–8.1)

## 2018-12-21 MED ORDER — METHYLPREDNISOLONE SODIUM SUCC 125 MG IJ SOLR
INTRAMUSCULAR | Status: AC
  Filled 2018-12-21: qty 2

## 2018-12-21 MED ORDER — DIPHENHYDRAMINE HCL 25 MG PO CAPS
50.0000 mg | ORAL_CAPSULE | Freq: Once | ORAL | Status: AC
  Administered 2018-12-21: 09:00:00 50 mg via ORAL

## 2018-12-21 MED ORDER — SODIUM CHLORIDE 0.9 % IV SOLN
16.3000 mg/kg | Freq: Once | INTRAVENOUS | Status: AC
  Administered 2018-12-21: 1500 mg via INTRAVENOUS
  Filled 2018-12-21: qty 60

## 2018-12-21 MED ORDER — LORAZEPAM 0.5 MG PO TABS
0.5000 mg | ORAL_TABLET | Freq: Once | ORAL | Status: AC
  Administered 2018-12-21: 0.5 mg via ORAL

## 2018-12-21 MED ORDER — METHYLPREDNISOLONE SODIUM SUCC 125 MG IJ SOLR
100.0000 mg | Freq: Once | INTRAMUSCULAR | Status: AC
  Administered 2018-12-21: 100 mg via INTRAVENOUS

## 2018-12-21 MED ORDER — SODIUM CHLORIDE 0.9% FLUSH
10.0000 mL | INTRAVENOUS | Status: DC | PRN
  Administered 2018-12-21: 10 mL
  Filled 2018-12-21: qty 10

## 2018-12-21 MED ORDER — HEPARIN SOD (PORK) LOCK FLUSH 100 UNIT/ML IV SOLN
500.0000 [IU] | Freq: Once | INTRAVENOUS | Status: AC | PRN
  Administered 2018-12-21: 12:00:00 500 [IU]
  Filled 2018-12-21: qty 5

## 2018-12-21 MED ORDER — DIPHENHYDRAMINE HCL 25 MG PO CAPS
ORAL_CAPSULE | ORAL | Status: AC
  Filled 2018-12-21: qty 2

## 2018-12-21 MED ORDER — SODIUM CHLORIDE 0.9 % IV SOLN
Freq: Once | INTRAVENOUS | Status: AC
  Administered 2018-12-21: 09:00:00 via INTRAVENOUS
  Filled 2018-12-21: qty 250

## 2018-12-21 MED ORDER — ACETAMINOPHEN 325 MG PO TABS
ORAL_TABLET | ORAL | Status: AC
  Filled 2018-12-21: qty 2

## 2018-12-21 MED ORDER — LORAZEPAM 0.5 MG PO TABS
ORAL_TABLET | ORAL | Status: AC
  Filled 2018-12-21: qty 1

## 2018-12-21 MED ORDER — SODIUM CHLORIDE 0.9 % IV SOLN: 16.2000 mg/kg | Freq: Once | INTRAVENOUS | Status: DC

## 2018-12-21 MED ORDER — ACETAMINOPHEN 325 MG PO TABS
650.0000 mg | ORAL_TABLET | Freq: Once | ORAL | Status: AC
  Administered 2018-12-21: 650 mg via ORAL

## 2018-12-21 NOTE — Patient Instructions (Signed)

## 2018-12-21 NOTE — Progress Notes (Signed)
Dr Marin Olp aware of d/c BP. No new orders at this time. Pt will take Catapres when he gets home as ordered & continue to monitor BP at home. dph

## 2018-12-21 NOTE — Patient Instructions (Signed)
Daratumumab injection What is this medicine? DARATUMUMAB (dar a toom ue mab) is a monoclonal antibody. It is used to treat multiple myeloma. This medicine may be used for other purposes; ask your health care provider or pharmacist if you have questions. COMMON BRAND NAME(S): DARZALEX What should I tell my health care provider before I take this medicine? They need to know if you have any of these conditions:  infection (especially a virus infection such as chickenpox, herpes, or hepatitis B virus)  lung or breathing disease  an unusual or allergic reaction to daratumumab, other medicines, foods, dyes, or preservatives  pregnant or trying to get pregnant  breast-feeding How should I use this medicine? This medicine is for infusion into a vein. It is given by a health care professional in a hospital or clinic setting. Talk to your pediatrician regarding the use of this medicine in children. Special care may be needed. Overdosage: If you think you have taken too much of this medicine contact a poison control center or emergency room at once. NOTE: This medicine is only for you. Do not share this medicine with others. What if I miss a dose? Keep appointments for follow-up doses as directed. It is important not to miss your dose. Call your doctor or health care professional if you are unable to keep an appointment. What may interact with this medicine? Interactions have not been studied. This list may not describe all possible interactions. Give your health care provider a list of all the medicines, herbs, non-prescription drugs, or dietary supplements you use. Also tell them if you smoke, drink alcohol, or use illegal drugs. Some items may interact with your medicine. What should I watch for while using this medicine? This drug may make you feel generally unwell. Report any side effects. Continue your course of treatment even though you feel ill unless your doctor tells you to stop. This  medicine can cause serious allergic reactions. To reduce your risk you may need to take medicine before treatment with this medicine. Take your medicine as directed. This medicine can affect the results of blood tests to match your blood type. These changes can last for up to 6 months after the final dose. Your healthcare provider will do blood tests to match your blood type before you start treatment. Tell all of your healthcare providers that you are being treated with this medicine before receiving a blood transfusion. This medicine can affect the results of some tests used to determine treatment response; extra tests may be needed to evaluate response. Do not become pregnant while taking this medicine or for 3 months after stopping it. Women should inform their doctor if they wish to become pregnant or think they might be pregnant. There is a potential for serious side effects to an unborn child. Talk to your health care professional or pharmacist for more information. What side effects may I notice from receiving this medicine? Side effects that you should report to your doctor or health care professional as soon as possible:  allergic reactions like skin rash, itching or hives, swelling of the face, lips, or tongue  breathing problems  chills  cough  dizziness  feeling faint or lightheaded  headache  low blood counts - this medicine may decrease the number of white blood cells, red blood cells and platelets. You may be at increased risk for infections and bleeding.  nausea, vomiting  shortness of breath  signs of decreased platelets or bleeding - bruising, pinpoint red spots on  the skin, black, tarry stools, blood in the urine  signs of decreased red blood cells - unusually weak or tired, feeling faint or lightheaded, falls  signs of infection - fever or chills, cough, sore throat, pain or difficulty passing urine  signs and symptoms of liver injury like dark yellow or brown  urine; general ill feeling or flu-like symptoms; light-colored stools; loss of appetite; right upper belly pain; unusually weak or tired; yellowing of the eyes or skin Side effects that usually do not require medical attention (report to your doctor or health care professional if they continue or are bothersome):  back pain  constipation  loss of appetite  diarrhea  joint pain  muscle cramps  pain, tingling, numbness in the hands or feet  swelling of the ankles, feet, hands  tiredness  trouble sleeping This list may not describe all possible side effects. Call your doctor for medical advice about side effects. You may report side effects to FDA at 1-800-FDA-1088. Where should I keep my medicine? Keep out of the reach of children. This drug is given in a hospital or clinic and will not be stored at home. NOTE: This sheet is a summary. It may not cover all possible information. If you have questions about this medicine, talk to your doctor, pharmacist, or health care provider.  2020 Elsevier/Gold Standard (2018-02-24 14:00:48)

## 2018-12-22 LAB — IGG, IGA, IGM
IgA: 17 mg/dL — ABNORMAL LOW (ref 61–437)
IgG (Immunoglobin G), Serum: 853 mg/dL (ref 603–1613)
IgM (Immunoglobulin M), Srm: 30 mg/dL (ref 15–143)

## 2018-12-22 LAB — KAPPA/LAMBDA LIGHT CHAINS
Kappa free light chain: 7.9 mg/L (ref 3.3–19.4)
Kappa, lambda light chain ratio: 2.08 — ABNORMAL HIGH (ref 0.26–1.65)
Lambda free light chains: 3.8 mg/L — ABNORMAL LOW (ref 5.7–26.3)

## 2018-12-23 ENCOUNTER — Encounter: Payer: Self-pay | Admitting: Cardiology

## 2018-12-23 ENCOUNTER — Telehealth (INDEPENDENT_AMBULATORY_CARE_PROVIDER_SITE_OTHER): Payer: Medicare Other | Admitting: Cardiology

## 2018-12-23 ENCOUNTER — Other Ambulatory Visit: Payer: Self-pay

## 2018-12-23 VITALS — BP 141/88 | HR 68 | Wt 206.0 lb

## 2018-12-23 DIAGNOSIS — C9000 Multiple myeloma not having achieved remission: Secondary | ICD-10-CM

## 2018-12-23 DIAGNOSIS — I10 Essential (primary) hypertension: Secondary | ICD-10-CM | POA: Diagnosis not present

## 2018-12-23 DIAGNOSIS — M7989 Other specified soft tissue disorders: Secondary | ICD-10-CM

## 2018-12-23 NOTE — Addendum Note (Signed)
Addended by: Ashok Norris on: 12/23/2018 12:07 PM   Modules accepted: Orders

## 2018-12-23 NOTE — Patient Instructions (Signed)
Medication Instructions:  Your physician recommends that you continue on your current medications as directed. Please refer to the Current Medication list given to you today.  If you need a refill on your cardiac medications before your next appointment, please call your pharmacy.   Lab work: Your physician recommends that you return for lab work in 1 week: BMP   If you have labs (blood work) drawn today and your tests are completely normal, you will receive your results only by: Marland Kitchen MyChart Message (if you have MyChart) OR . A paper copy in the mail If you have any lab test that is abnormal or we need to change your treatment, we will call you to review the results.  Testing/Procedures: None.   Follow-Up: At [facility], you and your health needs are our priority.  As part of our continuing mission to provide you with exceptional heart care, we have created designated Provider Care Teams.  These Care Teams include your primary Cardiologist (physician) and Advanced Practice Providers (APPs -  Physician Assistants and Nurse Practitioners) who all work together to provide you with the care you need, when you need it. You will need a follow up appointment in 2 months.  Please call our office 2 months in advance to schedule this appointment.  You may see No primary care provider on file. or another member of our [employer] Provider Team in [place]: Shirlee More, MD . Jyl Heinz, MD  Any Other Special Instructions Will Be Listed Below (If Applicable).

## 2018-12-23 NOTE — Progress Notes (Signed)
Virtual Visit via Telephone Note   This visit type was conducted due to national recommendations for restrictions regarding the COVID-19 Pandemic (e.g. social distancing) in an effort to limit this patient's exposure and mitigate transmission in our community.  Due to his co-morbid illnesses, this patient is at least at moderate risk for complications without adequate follow up.  This format is felt to be most appropriate for this patient at this time.  The patient did not have access to video technology/had technical difficulties with video requiring transitioning to audio format only (telephone).  All issues noted in this document were discussed and addressed.  No physical exam could be performed with this format.  Please refer to the patient's chart for his  consent to telehealth for [facility].  Evaluation Performed:  Follow-up visit  This visit type was conducted due to national recommendations for restrictions regarding the COVID-19 Pandemic (e.g. social distancing).  This format is felt to be most appropriate for this patient at this time.  All issues noted in this document were discussed and addressed.  No physical exam was performed (except for noted visual exam findings with Video Visits).  Please refer to the patient's chart (MyChart message for video visits and phone note for telephone visits) for the patient's consent to telehealth for [facility]  Date:  12/23/2018  ID: Robert [name], DOB [birth year], MRN [id number]   Patient Location: [place] [address]   Provider location:   [place] Office  PCP:  Leanna Battles, MD  Cardiologist:  Jenne Campus, MD     Chief Complaint: I am doing fine  History of Present Illness:    Robert [name] is a 85 y.o. male  who presents via audio/video conferencing for a telehealth visit today.  With history of multiple myeloma, essential hypertension which appears to be difficult to control,  swelling of lower extremities.  Initially he was sent to me because of swelling of lower extremities we did manage this successfully with diuretics.  He stopped his diuretics because his legs are not swollen anymore since that time we have difficulty controlling his blood pressure.  I talked to him about this issue and I told him that is important to keep taking his diuretics.  We put him back on 40 mg of furosemide every day in the morning plus he will keep taking his potassium the way he was taking before.  He is going to see his oncologist this coming week I asked him to have Chem-7 done when he will get there to check his kidney function as well as potassium.  Today his blood pressure is fairly reasonable 145/70.  However he does have spikes of high blood pressure more than 761 systolic.   The patient does not have symptoms concerning for COVID-19 infection (fever, chills, cough, or new SHORTNESS OF BREATH).    Prior CV studies:   The following studies were reviewed today:       Past Medical History:  Diagnosis Date  . Anemia of chronic renal failure, stage 3 (moderate) ([place]) 10/06/2018  . Arthritis   . 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 Goals of care, counseling/discussion 05/12/2018  . Hyperlipidemia   . Hypertension   . Iron deficiency anemia 10/07/2018  . 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 Multiple myeloma ([place])    2006    Past Surgical History:  Procedure Laterality Date  . CATARACT EXTRACTION BILATERAL W/ ANTERIOR VITRECTOMY    .  IR IMAGING GUIDED PORT INSERTION  05/30/2018  . LIMBAL STEM CELL TRANSPLANT       Current Meds  Medication Sig  . aspirin 81 MG tablet Take 81 mg by mouth daily.  . bimatoprost (LUMIGAN) 0.01 % SOLN Place 1 drop into both eyes at bedtime.   . Calcium Carbonate-Vit D-Min (CALTRATE 600+D PLUS PO) Take 1 tablet by mouth daily.  . carvedilol (COREG) 12.5 MG tablet Take 1 tablet (12.5 mg total) by mouth 2 (two) times daily with a meal.  .  Cholecalciferol (VITAMIN D3) 1000 UNITS CAPS Take 1,000 Units by mouth daily.   . cloNIDine (CATAPRES) 0.1 MG tablet Take 1 tablet (0.1 mg total) by mouth 2 (two) times daily. Take an extra tablet as needed for blood pressure greater than 170/100 (Patient taking differently: Take 0.1 mg by mouth as needed. Take an extra tablet as needed for blood pressure greater than 170/100)  . cyanocobalamin ([employer] VITAMIN B12) 2000 MCG tablet Take 2,000 mcg by mouth at bedtime.   . famciclovir (FAMVIR) 250 MG tablet Take 1 tablet (250 mg total) by mouth daily.  . furosemide (LASIX) 20 MG tablet Take 2 tablets (40 mg total) by mouth daily.  . hydrochlorothiazide (HYDRODIURIL) 25 MG tablet 25 mg daily.  Marland Kitchen losartan (COZAAR) 100 MG tablet Take 100 mg by mouth daily.  . metolazone (ZAROXOLYN) 5 MG tablet Take 1 pill 1 hour prior to taking Lasix daily  . Multiple Vitamin (MULITIVITAMIN WITH MINERALS) TABS Take 1 tablet by mouth daily.  Vladimir Faster Glycol-Propyl Glycol (SYSTANE OP) Place 1 drop into both eyes daily as needed (dry eyes).   . potassium chloride SA (K-DUR,KLOR-CON) 20 MEQ tablet Take 20 mEq by mouth daily.  . pravastatin (PRAVACHOL) 20 MG tablet Take 20 mg by mouth at bedtime.  . Sennosides (HCA LAX-X PO) Take by mouth every three (3) days as needed.  . traMADol (ULTRAM) 50 MG tablet Take 1 tablet (50 mg total) by mouth every 6 (six) hours as needed.  . Zoledronic Acid (ZOMETA) 4 MG/100ML IVPB Inject 4 mg into the vein every 4 (four) months. Administered by Dr. Jonette Eva at [facility] - last infusion mid July 2019      Family History: The patient's family history includes Diabetes in his brother; Heart attack in his father; Heart disease in his father; Throat cancer in his mother. There is no history of Colon cancer.   ROS:   Please see the history of present illness.     All other systems reviewed and are negative.   Labs/Other Tests and Data Reviewed:     Recent Labs: 12/24/2017:  Magnesium 2.0; TSH 1.614 09/15/2018: B Natriuretic Peptide 76.7 12/21/2018: ALT 11; BUN 15; Creatinine 0.99; Hemoglobin 10.9; Platelet Count 147; Potassium 3.7; Sodium 136  Recent Lipid Panel No results found for: CHOL, TRIG, HDL, CHOLHDL, VLDL, LDLCALC, LDLDIRECT    Exam:    Vital Signs:  BP (!) 141/88   Pulse 68   Wt 206 lb (93.4 kg)   BMI 28.73 kg/m     Wt Readings from Last 3 Encounters:  12/23/18 206 lb (93.4 kg)  12/21/18 202 lb (91.6 kg)  12/14/18 204 lb (92.5 kg)     Well nourished, well developed in no acute distress. Alert awakening x3.  Talking to me over the phone.  Unable to establish with dealing.  Diagnosis for this visit:   1. Essential hypertension   2. Multiple myeloma not having achieved remission ([place])  3. Swelling of both lower extremities      ASSESSMENT & PLAN:    1.  Essential hypertension plan as outlined above.  Will restart his diuretic it should be part of treatment for multidrug resistant hypertension.  She apparently is taking hydrochlorothiazide but he is not sure about it.  I will check his Chem-7 next week to make sure his kidney function and potassium are still within a reasonable range. 2.  Multiple myeloma managed excellently by oncology team. 3.  Swelling of lower extremities: Gone.  COVID-19 Education: The signs and symptoms of COVID-19 were discussed with the patient and how to seek care for testing (follow up with PCP or arrange E-visit).  The importance of social distancing was discussed today.  Patient Risk:   After full review of this patients clinical status, I feel that they are at least moderate risk at this time.  Time:   Today, I have spent 15 minutes with the patient with telehealth technology discussing pt health issues.  I spent 5 minutes reviewing her chart before the visit.  Visit was finished at 11:52 AM.    Medication Adjustments/Labs and Tests Ordered: Current medicines are reviewed at length with the patient  today.  Concerns regarding medicines are outlined above.  No orders of the defined types were placed in this encounter.  Medication changes: No orders of the defined types were placed in this encounter.    Disposition: Follow-up in 2 months  Signed, Park Liter, MD, [facility] 12/23/2018 11:50 AM    [place]

## 2018-12-26 LAB — PROTEIN ELECTROPHORESIS, SERUM, WITH REFLEX
A/G Ratio: 1.4 (ref 0.7–1.7)
Albumin ELP: 3.4 g/dL (ref 2.9–4.4)
Alpha-1-Globulin: 0.2 g/dL (ref 0.0–0.4)
Alpha-2-Globulin: 0.8 g/dL (ref 0.4–1.0)
Beta Globulin: 0.7 g/dL (ref 0.7–1.3)
Gamma Globulin: 0.7 g/dL (ref 0.4–1.8)
Globulin, Total: 2.4 g/dL (ref 2.2–3.9)
M-Spike, %: 0.3 g/dL — ABNORMAL HIGH
SPEP Interpretation: 0
Total Protein ELP: 5.8 g/dL — ABNORMAL LOW (ref 6.0–8.5)

## 2018-12-26 LAB — IMMUNOFIXATION REFLEX, SERUM
IgA: 18 mg/dL — ABNORMAL LOW (ref 61–437)
IgG (Immunoglobin G), Serum: 925 mg/dL (ref 603–1613)
IgM (Immunoglobulin M), Srm: 39 mg/dL (ref 15–143)

## 2018-12-28 ENCOUNTER — Encounter (HOSPITAL_BASED_OUTPATIENT_CLINIC_OR_DEPARTMENT_OTHER): Payer: Self-pay | Admitting: Emergency Medicine

## 2018-12-28 ENCOUNTER — Inpatient Hospital Stay: Payer: Medicare Other

## 2018-12-28 ENCOUNTER — Emergency Department (HOSPITAL_BASED_OUTPATIENT_CLINIC_OR_DEPARTMENT_OTHER)
Admission: EM | Admit: 2018-12-28 | Discharge: 2018-12-28 | Disposition: A | Discharge status: Home / Self Care | Payer: Medicare Other | Attending: Emergency Medicine | Admitting: Emergency Medicine

## 2018-12-28 ENCOUNTER — Inpatient Hospital Stay: Payer: Medicare Other | Attending: Hematology & Oncology

## 2018-12-28 ENCOUNTER — Telehealth: Payer: Self-pay | Admitting: Cardiology

## 2018-12-28 ENCOUNTER — Other Ambulatory Visit: Payer: Self-pay

## 2018-12-28 VITALS — BP 215/94 | HR 55 | Resp 18

## 2018-12-28 DIAGNOSIS — Z87891 Personal history of nicotine dependence: Secondary | ICD-10-CM | POA: Diagnosis not present

## 2018-12-28 DIAGNOSIS — C9002 Multiple myeloma in relapse: Secondary | ICD-10-CM | POA: Insufficient documentation

## 2018-12-28 DIAGNOSIS — C9000 Multiple myeloma not having achieved remission: Secondary | ICD-10-CM | POA: Diagnosis not present

## 2018-12-28 DIAGNOSIS — I129 Hypertensive chronic kidney disease with stage 1 through stage 4 chronic kidney disease, or unspecified chronic kidney disease: Secondary | ICD-10-CM | POA: Insufficient documentation

## 2018-12-28 DIAGNOSIS — Z79899 Other long term (current) drug therapy: Secondary | ICD-10-CM | POA: Insufficient documentation

## 2018-12-28 DIAGNOSIS — I10 Essential (primary) hypertension: Secondary | ICD-10-CM | POA: Diagnosis not present

## 2018-12-28 DIAGNOSIS — D630 Anemia in neoplastic disease: Secondary | ICD-10-CM | POA: Insufficient documentation

## 2018-12-28 DIAGNOSIS — Z9484 Stem cells transplant status: Secondary | ICD-10-CM | POA: Insufficient documentation

## 2018-12-28 DIAGNOSIS — N183 Chronic kidney disease, stage 3 (moderate): Secondary | ICD-10-CM | POA: Diagnosis not present

## 2018-12-28 DIAGNOSIS — Z9221 Personal history of antineoplastic chemotherapy: Secondary | ICD-10-CM | POA: Insufficient documentation

## 2018-12-28 DIAGNOSIS — Z7982 Long term (current) use of aspirin: Secondary | ICD-10-CM | POA: Diagnosis not present

## 2018-12-28 DIAGNOSIS — Z5112 Encounter for antineoplastic immunotherapy: Secondary | ICD-10-CM | POA: Insufficient documentation

## 2018-12-28 LAB — CBC WITH DIFFERENTIAL (CANCER CENTER ONLY)
Abs Immature Granulocytes: 0.01 10*3/uL (ref 0.00–0.07)
Basophils Absolute: 0 10*3/uL (ref 0.0–0.1)
Basophils Relative: 0 %
Eosinophils Absolute: 0.1 10*3/uL (ref 0.0–0.5)
Eosinophils Relative: 1 %
HCT: 31.9 % — ABNORMAL LOW (ref 39.0–52.0)
Hemoglobin: 10.5 g/dL — ABNORMAL LOW (ref 13.0–17.0)
Immature Granulocytes: 0 %
Lymphocytes Relative: 18 %
Lymphs Abs: 0.8 10*3/uL (ref 0.7–4.0)
MCH: 31.7 pg (ref 26.0–34.0)
MCHC: 32.9 g/dL (ref 30.0–36.0)
MCV: 96.4 fL (ref 80.0–100.0)
Monocytes Absolute: 0.5 10*3/uL (ref 0.1–1.0)
Monocytes Relative: 11 %
Neutro Abs: 3.2 10*3/uL (ref 1.7–7.7)
Neutrophils Relative %: 70 %
Platelet Count: 158 10*3/uL (ref 150–400)
RBC: 3.31 MIL/uL — ABNORMAL LOW (ref 4.22–5.81)
RDW: 17.5 % — ABNORMAL HIGH (ref 11.5–15.5)
WBC Count: 4.6 10*3/uL (ref 4.0–10.5)
nRBC: 0 % (ref 0.0–0.2)

## 2018-12-28 LAB — CMP (CANCER CENTER ONLY)
ALT: 11 U/L (ref 0–44)
AST: 13 U/L — ABNORMAL LOW (ref 15–41)
Albumin: 3.4 g/dL — ABNORMAL LOW (ref 3.5–5.0)
Alkaline Phosphatase: 45 U/L (ref 38–126)
Anion gap: 6 (ref 5–15)
BUN: 17 mg/dL (ref 8–23)
CO2: 29 mmol/L (ref 22–32)
Calcium: 9 mg/dL (ref 8.9–10.3)
Chloride: 101 mmol/L (ref 98–111)
Creatinine: 0.93 mg/dL (ref 0.61–1.24)
GFR, Est AFR Am: >60 mL/min (ref >60)
GFR, Estimated: >60 mL/min (ref >60)
Glucose, Bld: 113 mg/dL — ABNORMAL HIGH (ref 70–99)
Potassium: 3.7 mmol/L (ref 3.5–5.1)
Sodium: 136 mmol/L (ref 135–145)
Total Bilirubin: 0.6 mg/dL (ref 0.3–1.2)
Total Protein: 5.5 g/dL — ABNORMAL LOW (ref 6.5–8.1)

## 2018-12-28 LAB — SAMPLE TO BLOOD BANK

## 2018-12-28 MED ORDER — DIPHENHYDRAMINE HCL 25 MG PO CAPS
50.0000 mg | ORAL_CAPSULE | Freq: Once | ORAL | Status: AC
  Administered 2018-12-28: 50 mg via ORAL

## 2018-12-28 MED ORDER — SODIUM CHLORIDE 0.9 % IV SOLN
Freq: Once | INTRAVENOUS | Status: AC
  Administered 2018-12-28: 10:00:00 via INTRAVENOUS
  Filled 2018-12-28: qty 250

## 2018-12-28 MED ORDER — METHYLPREDNISOLONE SODIUM SUCC 125 MG IJ SOLR
100.0000 mg | Freq: Once | INTRAMUSCULAR | Status: AC
  Administered 2018-12-28: 100 mg via INTRAVENOUS

## 2018-12-28 MED ORDER — SODIUM CHLORIDE 0.9 % IV SOLN
16.3000 mg/kg | Freq: Once | INTRAVENOUS | Status: AC
  Administered 2018-12-28: 1500 mg via INTRAVENOUS
  Filled 2018-12-28: qty 60

## 2018-12-28 MED ORDER — DIPHENHYDRAMINE HCL 25 MG PO CAPS
ORAL_CAPSULE | ORAL | Status: AC
  Filled 2018-12-28: qty 2

## 2018-12-28 MED ORDER — SODIUM CHLORIDE 0.9% FLUSH
10.0000 mL | INTRAVENOUS | Status: DC | PRN
  Administered 2018-12-28: 10 mL
  Filled 2018-12-28: qty 10

## 2018-12-28 MED ORDER — METHYLPREDNISOLONE SODIUM SUCC 125 MG IJ SOLR
INTRAMUSCULAR | Status: AC
  Filled 2018-12-28: qty 2

## 2018-12-28 MED ORDER — HEPARIN SOD (PORK) LOCK FLUSH 100 UNIT/ML IV SOLN
500.0000 [IU] | Freq: Once | INTRAVENOUS | Status: AC | PRN
  Administered 2018-12-28: 500 [IU]
  Filled 2018-12-28: qty 5

## 2018-12-28 MED ORDER — ACETAMINOPHEN 325 MG PO TABS
650.0000 mg | ORAL_TABLET | Freq: Once | ORAL | Status: AC
  Administered 2018-12-28: 650 mg via ORAL

## 2018-12-28 MED ORDER — ACETAMINOPHEN 325 MG PO TABS
ORAL_TABLET | ORAL | Status: AC
  Filled 2018-12-28: qty 2

## 2018-12-28 NOTE — Patient Instructions (Signed)
Daratumumab injection What is this medicine? DARATUMUMAB (dar a toom ue mab) is a monoclonal antibody. It is used to treat multiple myeloma. This medicine may be used for other purposes; ask your health care provider or pharmacist if you have questions. COMMON BRAND NAME(S): DARZALEX What should I tell my health care provider before I take this medicine? They need to know if you have any of these conditions:  infection (especially a virus infection such as chickenpox, herpes, or hepatitis B virus)  lung or breathing disease  an unusual or allergic reaction to daratumumab, other medicines, foods, dyes, or preservatives  pregnant or trying to get pregnant  breast-feeding How should I use this medicine? This medicine is for infusion into a vein. It is given by a health care professional in a hospital or clinic setting. Talk to your pediatrician regarding the use of this medicine in children. Special care may be needed. Overdosage: If you think you have taken too much of this medicine contact a poison control center or emergency room at once. NOTE: This medicine is only for you. Do not share this medicine with others. What if I miss a dose? Keep appointments for follow-up doses as directed. It is important not to miss your dose. Call your doctor or health care professional if you are unable to keep an appointment. What may interact with this medicine? Interactions have not been studied. This list may not describe all possible interactions. Give your health care provider a list of all the medicines, herbs, non-prescription drugs, or dietary supplements you use. Also tell them if you smoke, drink alcohol, or use illegal drugs. Some items may interact with your medicine. What should I watch for while using this medicine? This drug may make you feel generally unwell. Report any side effects. Continue your course of treatment even though you feel ill unless your doctor tells you to stop. This  medicine can cause serious allergic reactions. To reduce your risk you may need to take medicine before treatment with this medicine. Take your medicine as directed. This medicine can affect the results of blood tests to match your blood type. These changes can last for up to 6 months after the final dose. Your healthcare provider will do blood tests to match your blood type before you start treatment. Tell all of your healthcare providers that you are being treated with this medicine before receiving a blood transfusion. This medicine can affect the results of some tests used to determine treatment response; extra tests may be needed to evaluate response. Do not become pregnant while taking this medicine or for 3 months after stopping it. Women should inform their doctor if they wish to become pregnant or think they might be pregnant. There is a potential for serious side effects to an unborn child. Talk to your health care professional or pharmacist for more information. What side effects may I notice from receiving this medicine? Side effects that you should report to your doctor or health care professional as soon as possible:  allergic reactions like skin rash, itching or hives, swelling of the face, lips, or tongue  breathing problems  chills  cough  dizziness  feeling faint or lightheaded  headache  low blood counts - this medicine may decrease the number of white blood cells, red blood cells and platelets. You may be at increased risk for infections and bleeding.  nausea, vomiting  shortness of breath  signs of decreased platelets or bleeding - bruising, pinpoint red spots on  the skin, black, tarry stools, blood in the urine  signs of decreased red blood cells - unusually weak or tired, feeling faint or lightheaded, falls  signs of infection - fever or chills, cough, sore throat, pain or difficulty passing urine  signs and symptoms of liver injury like dark yellow or brown  urine; general ill feeling or flu-like symptoms; light-colored stools; loss of appetite; right upper belly pain; unusually weak or tired; yellowing of the eyes or skin Side effects that usually do not require medical attention (report to your doctor or health care professional if they continue or are bothersome):  back pain  constipation  loss of appetite  diarrhea  joint pain  muscle cramps  pain, tingling, numbness in the hands or feet  swelling of the ankles, feet, hands  tiredness  trouble sleeping This list may not describe all possible side effects. Call your doctor for medical advice about side effects. You may report side effects to FDA at 1-800-FDA-1088. Where should I keep my medicine? Keep out of the reach of children. This drug is given in a hospital or clinic and will not be stored at home. NOTE: This sheet is a summary. It may not cover all possible information. If you have questions about this medicine, talk to your doctor, pharmacist, or health care provider.  2020 Elsevier/Gold Standard (2018-02-24 14:00:48)

## 2018-12-28 NOTE — ED Triage Notes (Addendum)
Pt was receiving chemo upstairs and he sts his bp was elevated and the nurse suggested he come to the ED to be checked. He is not having any other sx. Pt did take Clonidine 0.1mg  po just prior to coming down to ED. Gregary Signs, the cardiology nurse upstairs called and confirmed this information. She stated that the pts bp has been hard to control.

## 2018-12-28 NOTE — ED Notes (Signed)
Pt denies any symptoms- reports BP has been high. Pt appears to be in NAD.

## 2018-12-28 NOTE — Progress Notes (Signed)
Dr. Marin Olp aware of Pt.'s blood pressure value this morning. He verbally ordered pt. To take clonidine  0.2 mg now ( pt. Brought medication from home). Clonidine 0.2 mg given at 08:39 per Dr. Antonieta Pert verbal order.

## 2018-12-28 NOTE — ED Notes (Signed)
ED Provider at bedside. 

## 2018-12-28 NOTE — Discharge Instructions (Signed)
Take your blood pressure medications as directed.  As we discussed, please follow-up with your primary care doctor tomorrow.  Return the emergency department for any chest pain, difficulty breathing, numbness/weakness of your arms or legs, blurry vision or any other worsening concerning symptoms.

## 2018-12-28 NOTE — ED Provider Notes (Signed)
[facility] EMERGENCY DEPARTMENT Provider Note   CSN: [id number] Arrival date & time: 12/28/18  1357    History   Chief Complaint Chief Complaint  Patient presents with  . Hypertension    HPI Robert [name] is a 85 y.o. male past 1 consider multiple myeloma, hypertension, hyperlipidemia who presents for evaluation of hypertension.  Patient was upstairs at the cancer center getting a round of chemo when they noticed 3 of blood pressures with systolic blood pressures in the 220s.  They repeated it multiple times and was still getting elevated.  Patient did not have any symptoms but he was advised to go to the emergency department for further evaluation.  He has a history of hypertension that has been difficult to control.  He is currently on carvedilol, hydrochlorothiazide, clonidine.  He last took clonidine about 10 minutes before coming down to the ED.  He states that his blood pressure has been an ongoing issue with his primary care doctor and he has not been able to get it controlled.  He states he has been compliant with all of his medications.  He currently denies any vision changes, numbness/weakness of his arms or legs, shortness of breath, chest pain, headache, abdominal pain, nausea/vomiting.     The history is provided by the patient.    Past Medical History:  Diagnosis Date  . Anemia of chronic renal failure, stage 3 (moderate) ([place]) 10/06/2018  . Arthritis   . 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 Goals of care, counseling/discussion 05/12/2018  . Hyperlipidemia   . Hypertension   . Iron deficiency anemia 10/07/2018  . 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 Multiple myeloma ([place])    2006    Patient Active Problem List   Diagnosis Date Noted  . Swelling of both lower extremities 10/18/2018  . Iron deficiency anemia 10/07/2018  . 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 Anemia of chronic renal failure, stage 3 (moderate) ([place]) 10/06/2018  . Erythropoietin deficiency  anemia 10/06/2018  . Essential hypertension 07/15/2018  . Goals of care, counseling/discussion 05/12/2018  . Syncope 12/24/2017  . Multiple myeloma in relapse ([place]) 07/30/2016  . Lytic bone lesions on xray 03/17/2016  . Myeloma ([place]) 07/02/2011    Past Surgical History:  Procedure Laterality Date  . CATARACT EXTRACTION BILATERAL W/ ANTERIOR VITRECTOMY    . IR IMAGING GUIDED PORT INSERTION  05/30/2018  . LIMBAL STEM CELL TRANSPLANT          Home Medications    Prior to Admission medications   Medication Sig Start Date End Date Taking? Authorizing Provider  aspirin 81 MG tablet Take 81 mg by mouth daily.    [provider]  bimatoprost (LUMIGAN) 0.01 % SOLN Place 1 drop into both eyes at bedtime.  12/07/11   [provider]  Calcium Carbonate-Vit D-Min (CALTRATE 600+D PLUS PO) Take 1 tablet by mouth daily.    [provider]  carvedilol (COREG) 12.5 MG tablet Take 1 tablet (12.5 mg total) by mouth 2 (two) times daily with a meal. 07/15/18   Park Liter, MD  Cholecalciferol (VITAMIN D3) 1000 UNITS CAPS Take 1,000 Units by mouth daily.     [provider]  cloNIDine (CATAPRES) 0.1 MG tablet Take 1 tablet (0.1 mg total) by mouth 2 (two) times daily. Take an extra tablet as needed for blood pressure greater than 170/100 Patient taking differently: Take 0.1 mg by mouth as needed. Take an extra tablet as needed for blood pressure greater than 170/100  08/19/18   Park Liter, MD  cyanocobalamin ([facility] VITAMIN B12) 2000 MCG tablet Take 2,000 mcg by mouth at bedtime.     [provider]  famciclovir (FAMVIR) 250 MG tablet Take 1 tablet (250 mg total) by mouth daily. 01/27/18   Volanda Napoleon, MD  furosemide (LASIX) 20 MG tablet Take 2 tablets (40 mg total) by mouth daily. 09/22/18   Park Liter, MD  hydrochlorothiazide (HYDRODIURIL) 25 MG tablet 25 mg daily. 10/28/18   [provider]  losartan (COZAAR) 100 MG tablet Take 100  mg by mouth daily.    [provider]  metolazone (ZAROXOLYN) 5 MG tablet Take 1 pill 1 hour prior to taking Lasix daily 09/29/18   Volanda Napoleon, MD  Multiple Vitamin (MULITIVITAMIN WITH MINERALS) TABS Take 1 tablet by mouth daily.    [provider]  Polyethyl Glycol-Propyl Glycol (SYSTANE OP) Place 1 drop into both eyes daily as needed (dry eyes).     [provider]  potassium chloride SA (K-DUR,KLOR-CON) 20 MEQ tablet Take 20 mEq by mouth daily.    [provider]  pravastatin (PRAVACHOL) 20 MG tablet Take 20 mg by mouth at bedtime.    [provider]  Sennosides (HCA LAX-X PO) Take by mouth every three (3) days as needed.    [provider]  traMADol (ULTRAM) 50 MG tablet Take 1 tablet (50 mg total) by mouth every 6 (six) hours as needed. 09/29/18   Volanda Napoleon, MD  Zoledronic Acid (ZOMETA) 4 MG/100ML IVPB Inject 4 mg into the vein every 4 (four) months. Administered by Dr. Jonette Eva at [facility] - last infusion mid July 2019    [provider]    Family History Family History  Problem Relation Age of Onset  . Heart attack Father   . Heart disease Father   . Throat cancer Mother   . Diabetes Brother   . Colon cancer Neg Hx     Social History Social History   Tobacco Use  . Smoking status: Former Smoker    Packs/day: 4.00    Years: 9.00    Pack years: 36.00    Types: Cigars, Cigarettes    Start date: 07/08/1979    Quit date: 07/07/1988    Years since quitting: 30.4  . Smokeless tobacco: Never Used  . Tobacco comment: quit 24 years ago  Substance Use Topics  . Alcohol use: Yes    Alcohol/week: 6.0 standard drinks    Types: 6 Glasses of wine per week    Comment: very seldom  . Drug use: No     Allergies   Patient has no known allergies.   Review of Systems Review of Systems  Respiratory: Negative for shortness of breath.   Cardiovascular: Negative for chest pain.  Gastrointestinal:  Negative for abdominal pain, nausea and vomiting.  Neurological: Negative for weakness, numbness and headaches.  All other systems reviewed and are negative.    Physical Exam Updated Vital Signs BP (!) 227/101   Pulse (!) 59   Resp 16   Ht '5\' 11"'  (1.803 m)   Wt 92.1 kg   SpO2 100%   BMI 28.31 kg/m   Physical Exam Vitals signs and nursing note reviewed.  Constitutional:      Appearance: Normal appearance. He is well-developed.  HENT:     Head: Normocephalic and atraumatic.  Eyes:     General: Lids are normal.     Conjunctiva/sclera: Conjunctivae normal.  Pupils: Pupils are equal, round, and reactive to light.  Neck:     Musculoskeletal: Full passive range of motion without pain.  Cardiovascular:     Rate and Rhythm: Normal rate and regular rhythm.     Pulses: Normal pulses.          Radial pulses are 2+ on the right side and 2+ on the left side.       Dorsalis pedis pulses are 2+ on the right side and 2+ on the left side.     Heart sounds: Normal heart sounds. No murmur. No friction rub. No gallop.   Pulmonary:     Effort: Pulmonary effort is normal.     Breath sounds: Normal breath sounds.     Comments: Lungs clear to auscultation bilaterally.  Symmetric chest rise.  No wheezing, rales, rhonchi. Abdominal:     Palpations: Abdomen is soft. Abdomen is not rigid.     Tenderness: There is no abdominal tenderness. There is no guarding.     Comments: Abdomen is soft, non-distended, non-tender. No rigidity, No guarding. No peritoneal signs.  Musculoskeletal: Normal range of motion.  Skin:    General: Skin is warm and dry.     Capillary Refill: Capillary refill takes less than 2 seconds.  Neurological:     Mental Status: He is alert and oriented to person, place, and time.     Comments: Cranial nerves III-XII intact Follows commands, Moves all extremities  5/5 strength to BUE and BLE  Sensation intact throughout all major nerve distributions Normal coordination No  pronator drift. No gait abnormalities  No slurred speech. No facial droop.   Psychiatric:        Speech: Speech normal.      ED Treatments / Results  Labs (all labs ordered are listed, but only abnormal results are displayed) Labs Reviewed - No data to display  EKG None  Radiology No results found.  Procedures Procedures (including critical care time)  Medications Ordered in ED Medications - No data to display   Initial Impression / Assessment and Plan / ED Course  I have reviewed the triage vital signs and the nursing notes.  Pertinent labs & imaging results that were available during my care of the patient were reviewed by me and considered in my medical decision making (see chart for details).        85 year old male past mostly of multiple myeloma, hypertension, hyperlipidemia who presents for evaluation of high blood pressure.  No symptoms at this time.  On initial ED arrival, he is afebrile, hypertensive at 227/101.  Vitals otherwise stable.  He currently has no chest pain, difficulty breathing, numbness/weakness, headache, vision changes.  No neuro deficits noted on exam.  He took a clonidine right before he came to the ED.  At this time, he does not exhibit emergent hypertension does not need emergent control of his blood pressure. Discussed patient with Dr. Maryan Rued who is agreeable to plan. At this time, patient exhibits no emergent life-threatening condition that require further evaluation in ED or admission. Patient had ample opportunity for questions and discussion. All patient's questions were answered with full understanding. Strict return precautions discussed. Patient expresses understanding and agreement to plan.   Portions of this note were generated with Lobby[employer]. Dictation errors may occur despite best attempts at proofreading.  Final Clinical Impressions(s) / ED Diagnoses   Final diagnoses:  Essential hypertension    ED Discharge  Orders    None  Volanda Napoleon, PA-C 12/28/18 2227    Blanchie Dessert, MD 12/28/18 [phone]

## 2018-12-28 NOTE — Telephone Encounter (Signed)
Dr. Agustin Cree patient who has had difficult to control BP's finished chemo at [facility] down the hall and came to Cardiology office to report High BP's. 3 BP's checked today were 215/94, HR 55, 228/91, 216/91. Denies vertigo, headache, blurred vision or other symptoms. Patient has prn Clonidine 0.1 mg and took one tablet n the waiting room. Dr. Agustin Cree not in office today. Dr. Geraldo Pitter has no schedule openings this afternoon and advised patient to go to ER on 1st floor. Accompanied patient to ER

## 2018-12-29 ENCOUNTER — Telehealth: Payer: Self-pay | Admitting: Cardiology

## 2018-12-29 NOTE — Telephone Encounter (Signed)
Called patient he reports every time he gets chemo his blood pressure increases. Please see call from yesterday. Patient was advised to go to the emergency department. He was told there everything was ok. His blood pressure readings today are better 146/87, 129/82, 144/86, 155/92, 160/92, 124/80, and 141/83. He had labs recently drawn and wants to know if Dr. Agustin Cree still needs lab work drawn as well. He also wants to know if there are any changes that need to be made to his medications for blood pressure. I will consult with Dr. Agustin Cree.

## 2018-12-30 ENCOUNTER — Telehealth: Payer: Self-pay | Admitting: Cardiology

## 2018-12-30 ENCOUNTER — Other Ambulatory Visit: Payer: Self-pay

## 2018-12-30 ENCOUNTER — Encounter (HOSPITAL_COMMUNITY): Payer: Self-pay

## 2018-12-30 ENCOUNTER — Emergency Department (HOSPITAL_COMMUNITY)
Admission: EM | Admit: 2018-12-30 | Discharge: 2018-12-30 | Disposition: A | Discharge status: Home / Self Care | Payer: Medicare Other | Attending: Emergency Medicine | Admitting: Emergency Medicine

## 2018-12-30 DIAGNOSIS — N183 Chronic kidney disease, stage 3 (moderate): Secondary | ICD-10-CM | POA: Insufficient documentation

## 2018-12-30 DIAGNOSIS — I10 Essential (primary) hypertension: Secondary | ICD-10-CM

## 2018-12-30 DIAGNOSIS — Z7982 Long term (current) use of aspirin: Secondary | ICD-10-CM | POA: Insufficient documentation

## 2018-12-30 DIAGNOSIS — Z87891 Personal history of nicotine dependence: Secondary | ICD-10-CM | POA: Insufficient documentation

## 2018-12-30 DIAGNOSIS — Z8579 Personal history of other malignant neoplasms of lymphoid, hematopoietic and related tissues: Secondary | ICD-10-CM | POA: Diagnosis not present

## 2018-12-30 DIAGNOSIS — I129 Hypertensive chronic kidney disease with stage 1 through stage 4 chronic kidney disease, or unspecified chronic kidney disease: Secondary | ICD-10-CM | POA: Insufficient documentation

## 2018-12-30 DIAGNOSIS — Z79899 Other long term (current) drug therapy: Secondary | ICD-10-CM | POA: Insufficient documentation

## 2018-12-30 MED ORDER — HYDRALAZINE HCL 20 MG/ML IJ SOLN
5.0000 mg | Freq: Once | INTRAMUSCULAR | Status: AC
  Administered 2018-12-30: 5 mg via INTRAVENOUS
  Filled 2018-12-30: qty 1

## 2018-12-30 MED ORDER — CLONIDINE HCL 0.1 MG PO TABS
0.1000 mg | ORAL_TABLET | Freq: Once | ORAL | Status: AC
  Administered 2018-12-30: 0.1 mg via ORAL
  Filled 2018-12-30: qty 1

## 2018-12-30 NOTE — Telephone Encounter (Signed)
Called patient back his blood pressure monitor is not reading now. Dr. Agustin Cree advised [facility] to [facility] to [facility] to be checked. Patient will and will call us back.

## 2018-12-30 NOTE — Telephone Encounter (Signed)
Patient called back still wanting to know what to do about his blood pressure. Today his blood pressure is 201/118 and another reading 183/113. Took clonidine 0.1 mg at 11:14 am  and 11:38 am. Blood pressure currently on phone after clonidine 0.1 x 2 is 190/114. Will consult with Dr. Agustin Cree.

## 2018-12-30 NOTE — ED Provider Notes (Signed)
[facility] DEPT Provider Note   CSN: [id number] Arrival date & time: 12/30/18  1514     History   Chief Complaint Chief Complaint  Patient presents with  . Hypertension    HPI Robert [name] is a 85 y.o. male.     Patient presented with his blood pressure elevated.  Patient not having symptoms.  He is presently taking for blood pressure medicines  The history is provided by the patient. No language interpreter was used.  Hypertension This is a recurrent problem. The problem occurs constantly. The problem has been gradually worsening. Pertinent negatives include no chest pain, no abdominal pain and no headaches. Nothing aggravates the symptoms. Nothing relieves the symptoms. He has tried nothing for the symptoms. The treatment provided no relief.    Past Medical History:  Diagnosis Date  . Anemia of chronic renal failure, stage 3 (moderate) ([place]) 10/06/2018  . Arthritis   . 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 Goals of care, counseling/discussion 05/12/2018  . Hyperlipidemia   . Hypertension   . Iron deficiency anemia 10/07/2018  . 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 Multiple myeloma ([place])    2006    Patient Active Problem List   Diagnosis Date Noted  . Swelling of both lower extremities 10/18/2018  . Iron deficiency anemia 10/07/2018  . 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 Anemia of chronic renal failure, stage 3 (moderate) ([place]) 10/06/2018  . 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 Essential hypertension 07/15/2018  . Goals of care, counseling/discussion 05/12/2018  . Syncope 12/24/2017  . Multiple myeloma in relapse ([place]) 07/30/2016  . Lytic bone lesions on xray 03/17/2016  . Myeloma ([place]) 07/02/2011    Past Surgical History:  Procedure Laterality Date  . CATARACT EXTRACTION BILATERAL W/ ANTERIOR VITRECTOMY    . IR IMAGING GUIDED PORT INSERTION  05/30/2018  . LIMBAL STEM CELL TRANSPLANT          Home Medications     Prior to Admission medications   Medication Sig Start Date End Date Taking? Authorizing Provider  aspirin 81 MG tablet Take 81 mg by mouth daily.    [provider]  bimatoprost (LUMIGAN) 0.01 % SOLN Place 1 drop into both eyes at bedtime.  12/07/11   [provider]  Calcium Carbonate-Vit D-Min (CALTRATE 600+D PLUS PO) Take 1 tablet by mouth daily.    [provider]  carvedilol (COREG) 12.5 MG tablet Take 1 tablet (12.5 mg total) by mouth 2 (two) times daily with a meal. 07/15/18   Park Liter, MD  Cholecalciferol (VITAMIN D3) 1000 UNITS CAPS Take 1,000 Units by mouth daily.     [provider]  cloNIDine (CATAPRES) 0.1 MG tablet Take 1 tablet (0.1 mg total) by mouth 2 (two) times daily. Take an extra tablet as needed for blood pressure greater than 170/100 Patient taking differently: Take 0.1 mg by mouth as needed. Take an extra tablet as needed for blood pressure greater than 170/100 08/19/18   Park Liter, MD  cyanocobalamin ([facility] VITAMIN B12) 2000 MCG tablet Take 2,000 mcg by mouth at bedtime.     [provider]  famciclovir (FAMVIR) 250 MG tablet Take 1 tablet (250 mg total) by mouth daily. 01/27/18   Volanda Napoleon, MD  furosemide (LASIX) 20 MG tablet Take 2 tablets (40 mg total) by mouth daily. 09/22/18   Park Liter, MD  hydrochlorothiazide (HYDRODIURIL) 25 MG tablet 25 mg daily. 10/28/18   [provider]  losartan (  COZAAR) 100 MG tablet Take 100 mg by mouth daily.    [provider]  metolazone (ZAROXOLYN) 5 MG tablet Take 1 pill 1 hour prior to taking Lasix daily 09/29/18   Volanda Napoleon, MD  Multiple Vitamin (MULITIVITAMIN WITH MINERALS) TABS Take 1 tablet by mouth daily.    [provider]  Polyethyl Glycol-Propyl Glycol (SYSTANE OP) Place 1 drop into both eyes daily as needed (dry eyes).     [provider]  potassium chloride SA (K-DUR,KLOR-CON) 20 MEQ tablet Take 20 mEq by mouth  daily.    [provider]  pravastatin (PRAVACHOL) 20 MG tablet Take 20 mg by mouth at bedtime.    [provider]  Sennosides (HCA LAX-X PO) Take by mouth every three (3) days as needed.    [provider]  traMADol (ULTRAM) 50 MG tablet Take 1 tablet (50 mg total) by mouth every 6 (six) hours as needed. 09/29/18   Volanda Napoleon, MD  Zoledronic Acid (ZOMETA) 4 MG/100ML IVPB Inject 4 mg into the vein every 4 (four) months. Administered by Dr[facility] Jonette Eva at [facility] - last infusion mid July 2019    [provider]    Family History Family History  Problem Relation Age of Onset  . Heart attack Father   . Heart disease Father   . Throat cancer Mother   . Diabetes Brother   . Colon cancer Neg Hx     Social History Social History   Tobacco Use  . Smoking status: Former Smoker    Packs/day: 4.00    Years: 9.00    Pack years: 36.00    Types: Cigars, Cigarettes    Start date: 07/08/1979    Quit date: 07/07/1988    Years since quitting: 30.5  . Smokeless tobacco: Never Used  . Tobacco comment: quit 24 years ago  Substance Use Topics  . Alcohol use: Yes    Alcohol/week: 6.0 standard drinks    Types: 6 Glasses of wine per week    Comment: very seldom  . Drug use: No     Allergies   Patient has no known allergies.   Review of Systems Review of Systems  Constitutional: Negative for appetite change and fatigue.  HENT: Negative for congestion, ear discharge and sinus pressure.   Eyes: Negative for discharge.  Respiratory: Negative for cough.   Cardiovascular: Negative for chest pain.  Gastrointestinal: Negative for abdominal pain and diarrhea.  Genitourinary: Negative for frequency and hematuria.  Musculoskeletal: Negative for back pain.  Skin: Negative for rash.  Neurological: Negative for seizures and headaches.  Psychiatric/Behavioral: Negative for hallucinations.     Physical Exam Updated Vital Signs BP (!) 220/92    Pulse 66   Temp 98.1 F (36.7 C) (Oral)   Resp 17   Ht _0  (1.803 m)   Wt 94.8 kg   SpO2 100%   BMI 29.15 kg/m   Physical Exam Vitals signs and nursing note reviewed.  Constitutional:      Appearance: He is well-developed.  HENT:     Head: Normocephalic.     Nose: Nose normal.  Eyes:     General: No scleral icterus.    Conjunctiva/sclera: Conjunctivae normal.  Neck:     Musculoskeletal: Neck supple.     Thyroid: No thyromegaly.  Cardiovascular:     Rate and Rhythm: Normal rate and regular rhythm.     Heart sounds: No murmur. No friction rub. No gallop.  Pulmonary:     Breath sounds: No stridor. No wheezing or rales.  Chest:     Chest wall: No tenderness.  Abdominal:     General: There is no distension.     Tenderness: There is no abdominal tenderness. There is no rebound.  Musculoskeletal: Normal range of motion.  Lymphadenopathy:     Cervical: No cervical adenopathy.  Skin:    Findings: No erythema or rash.  Neurological:     Mental Status: He is oriented to person, place, and time.     Motor: No abnormal muscle tone.     Coordination: Coordination normal.  Psychiatric:        Behavior: Behavior normal.      ED Treatments / Results  Labs (all labs ordered are listed, but only abnormal results are displayed) Labs Reviewed - No data to display  EKG None  Radiology No results found.  Procedures Procedures (including critical care time)  Medications Ordered in ED Medications  cloNIDine (CATAPRES) tablet 0.1 mg (has no administration in time range)  hydrALAZINE (APRESOLINE) injection 5 mg (5 mg Intravenous Given 12/30/18 1716)  hydrALAZINE (APRESOLINE) injection 5 mg (5 mg Intravenous Given 12/30/18 1811)     Initial Impression / Assessment and Plan / ED Course  I have reviewed the triage vital signs and the nursing notes.  Pertinent labs & imaging results that were available during my care of the patient were reviewed by me and considered in my  medical decision making (see chart for details).        Patient was given 10 mg of hydralazine and also 0.1 mg p.o. clonidine.  His blood pressure improved some.  We will increase his clonidine so he is taken 0.2 mg twice a day instead of 0.1 mg twice a day.  He will follow-up with his doctor next week  Final Clinical Impressions(s) / ED Diagnoses   Final diagnoses:  Essential hypertension    ED Discharge Orders    None       Milton Ferguson, MD 12/30/18 1930

## 2018-12-30 NOTE — Discharge Instructions (Addendum)
Increase the clonidine so you are taking 0.2 mg twice a day.  Follow-up with your family doctor next week to see how your blood pressure is doing

## 2018-12-30 NOTE — ED Triage Notes (Signed)
This morning patients nurse called patient about his high BP.   Patient states he took 1 clonidine at 11 am and another at 11:20. Patient also reports taking BP medication this morning.    Patient reports taking chemo treatment since 2006.   Patient denies any symptoms.   A/ox4 Ambulatory in triage.

## 2018-12-30 NOTE — Telephone Encounter (Signed)
See previous call please.

## 2018-12-30 NOTE — Telephone Encounter (Signed)
Patient called back and reports [facility]'t [facility] anymore due to COVID 19. Per Dr. Agustin Cree patient advised to go to the emergency room due to possible increased blood pressures from earlier. Patient verbally understands. No further questions.

## 2018-12-30 NOTE — ED Notes (Signed)
Put hospital socks on pt and gave him urinal

## 2018-12-30 NOTE — Telephone Encounter (Signed)
Talked to Dr. Agustin Cree he recommends patient take clonidine 0.1 mg daily and a extra one if needed. Patient reports he has been taking clonidine 0.1 mg daily for two weeks. Patient advised to relax for one hour and we will return call to see how is blood pressure is then per Dr. Agustin Cree. Patient verbally understands.

## 2018-12-30 NOTE — Telephone Encounter (Signed)
Left message for patient to return call.

## 2019-01-03 ENCOUNTER — Other Ambulatory Visit: Payer: Self-pay | Admitting: *Deleted

## 2019-01-03 DIAGNOSIS — C9002 Multiple myeloma in relapse: Secondary | ICD-10-CM

## 2019-01-04 ENCOUNTER — Inpatient Hospital Stay: Payer: Medicare Other

## 2019-01-04 ENCOUNTER — Other Ambulatory Visit: Payer: Self-pay

## 2019-01-04 VITALS — BP 162/77 | HR 65 | Temp 97.8°F | Resp 19

## 2019-01-04 DIAGNOSIS — Z9484 Stem cells transplant status: Secondary | ICD-10-CM | POA: Diagnosis not present

## 2019-01-04 DIAGNOSIS — C9002 Multiple myeloma in relapse: Secondary | ICD-10-CM

## 2019-01-04 DIAGNOSIS — D630 Anemia in neoplastic disease: Secondary | ICD-10-CM

## 2019-01-04 DIAGNOSIS — Z5112 Encounter for antineoplastic immunotherapy: Secondary | ICD-10-CM | POA: Diagnosis not present

## 2019-01-04 DIAGNOSIS — Z79899 Other long term (current) drug therapy: Secondary | ICD-10-CM | POA: Diagnosis not present

## 2019-01-04 LAB — COMPREHENSIVE METABOLIC PANEL
ALT: 10 U/L (ref 0–44)
AST: 13 U/L — ABNORMAL LOW (ref 15–41)
Albumin: 3.6 g/dL (ref 3.5–5.0)
Alkaline Phosphatase: 52 U/L (ref 38–126)
Anion gap: 8 (ref 5–15)
BUN: 23 mg/dL (ref 8–23)
CO2: 29 mmol/L (ref 22–32)
Calcium: 8.7 mg/dL — ABNORMAL LOW (ref 8.9–10.3)
Chloride: 94 mmol/L — ABNORMAL LOW (ref 98–111)
Creatinine, Ser: 1.18 mg/dL (ref 0.61–1.24)
GFR calc Af Amer: >60 mL/min (ref >60)
GFR calc non Af Amer: 57 mL/min — ABNORMAL LOW (ref >60)
Glucose, Bld: 118 mg/dL — ABNORMAL HIGH (ref 70–99)
Potassium: 3.2 mmol/L — ABNORMAL LOW (ref 3.5–5.1)
Sodium: 131 mmol/L — ABNORMAL LOW (ref 135–145)
Total Bilirubin: 0.7 mg/dL (ref 0.3–1.2)
Total Protein: 5.6 g/dL — ABNORMAL LOW (ref 6.5–8.1)

## 2019-01-04 LAB — CBC WITH DIFFERENTIAL (CANCER CENTER ONLY)
Abs Immature Granulocytes: 0.03 10*3/uL (ref 0.00–0.07)
Basophils Absolute: 0 10*3/uL (ref 0.0–0.1)
Basophils Relative: 0 %
Eosinophils Absolute: 0.1 10*3/uL (ref 0.0–0.5)
Eosinophils Relative: 1 %
HCT: 34.1 % — ABNORMAL LOW (ref 39.0–52.0)
Hemoglobin: 11.5 g/dL — ABNORMAL LOW (ref 13.0–17.0)
Immature Granulocytes: 1 %
Lymphocytes Relative: 23 %
Lymphs Abs: 1.2 10*3/uL (ref 0.7–4.0)
MCH: 32.1 pg (ref 26.0–34.0)
MCHC: 33.7 g/dL (ref 30.0–36.0)
MCV: 95.3 fL (ref 80.0–100.0)
Monocytes Absolute: 0.6 10*3/uL (ref 0.1–1.0)
Monocytes Relative: 12 %
Neutro Abs: 3.3 10*3/uL (ref 1.7–7.7)
Neutrophils Relative %: 63 %
Platelet Count: 167 10*3/uL (ref 150–400)
RBC: 3.58 MIL/uL — ABNORMAL LOW (ref 4.22–5.81)
RDW: 16.4 % — ABNORMAL HIGH (ref 11.5–15.5)
WBC Count: 5.2 10*3/uL (ref 4.0–10.5)
nRBC: 0 % (ref 0.0–0.2)

## 2019-01-04 MED ORDER — SODIUM CHLORIDE 0.9 % IV SOLN
Freq: Once | INTRAVENOUS | Status: AC
  Administered 2019-01-04: 10:00:00 via INTRAVENOUS
  Filled 2019-01-04: qty 250

## 2019-01-04 MED ORDER — METOLAZONE 5 MG PO TABS: ORAL_TABLET | ORAL | 3 refills | Status: DC

## 2019-01-04 MED ORDER — DIPHENHYDRAMINE HCL 25 MG PO CAPS
50.0000 mg | ORAL_CAPSULE | Freq: Once | ORAL | Status: AC
  Administered 2019-01-04: 50 mg via ORAL

## 2019-01-04 MED ORDER — METHYLPREDNISOLONE SODIUM SUCC 125 MG IJ SOLR
100.0000 mg | Freq: Once | INTRAMUSCULAR | Status: AC
  Administered 2019-01-04: 100 mg via INTRAVENOUS

## 2019-01-04 MED ORDER — SODIUM CHLORIDE 0.9% FLUSH
10.0000 mL | INTRAVENOUS | Status: DC | PRN
  Administered 2019-01-04: 10 mL
  Filled 2019-01-04: qty 10

## 2019-01-04 MED ORDER — DIPHENHYDRAMINE HCL 25 MG PO CAPS
ORAL_CAPSULE | ORAL | Status: AC
  Filled 2019-01-04: qty 2

## 2019-01-04 MED ORDER — HEPARIN SOD (PORK) LOCK FLUSH 100 UNIT/ML IV SOLN
500.0000 [IU] | Freq: Once | INTRAVENOUS | Status: AC | PRN
  Administered 2019-01-04: 500 [IU]
  Filled 2019-01-04: qty 5

## 2019-01-04 MED ORDER — METHYLPREDNISOLONE SODIUM SUCC 125 MG IJ SOLR
INTRAMUSCULAR | Status: AC
  Filled 2019-01-04: qty 2

## 2019-01-04 MED ORDER — SODIUM CHLORIDE 0.9 % IV SOLN
16.3000 mg/kg | Freq: Once | INTRAVENOUS | Status: AC
  Administered 2019-01-04: 1500 mg via INTRAVENOUS
  Filled 2019-01-04: qty 60

## 2019-01-04 MED ORDER — ACETAMINOPHEN 325 MG PO TABS
ORAL_TABLET | ORAL | Status: AC
  Filled 2019-01-04: qty 2

## 2019-01-04 MED ORDER — ACETAMINOPHEN 325 MG PO TABS
650.0000 mg | ORAL_TABLET | Freq: Once | ORAL | Status: AC
  Administered 2019-01-04: 650 mg via ORAL

## 2019-01-04 NOTE — Progress Notes (Signed)
OK to treat with sodium-131 and potassium-3.2 per Dr. Marin Olp.  No new orders received at this time.

## 2019-01-10 ENCOUNTER — Other Ambulatory Visit: Payer: Self-pay | Admitting: *Deleted

## 2019-01-10 DIAGNOSIS — C9000 Multiple myeloma not having achieved remission: Secondary | ICD-10-CM

## 2019-01-10 DIAGNOSIS — D631 Anemia in chronic kidney disease: Secondary | ICD-10-CM

## 2019-01-10 DIAGNOSIS — C9002 Multiple myeloma in relapse: Secondary | ICD-10-CM

## 2019-01-11 ENCOUNTER — Other Ambulatory Visit: Payer: Self-pay

## 2019-01-11 ENCOUNTER — Telehealth: Payer: Self-pay | Admitting: Hematology & Oncology

## 2019-01-11 ENCOUNTER — Encounter: Payer: Self-pay | Admitting: Hematology & Oncology

## 2019-01-11 ENCOUNTER — Inpatient Hospital Stay (HOSPITAL_BASED_OUTPATIENT_CLINIC_OR_DEPARTMENT_OTHER): Payer: Medicare Other | Admitting: Hematology & Oncology

## 2019-01-11 ENCOUNTER — Inpatient Hospital Stay: Payer: Medicare Other

## 2019-01-11 VITALS — BP 165/76 | HR 63 | Temp 96.9°F | Resp 18

## 2019-01-11 VITALS — BP 175/91 | HR 63 | Temp 97.5°F | Resp 18 | Wt 201.0 lb

## 2019-01-11 DIAGNOSIS — C9000 Multiple myeloma not having achieved remission: Secondary | ICD-10-CM

## 2019-01-11 DIAGNOSIS — C9002 Multiple myeloma in relapse: Secondary | ICD-10-CM

## 2019-01-11 DIAGNOSIS — N183 Chronic kidney disease, stage 3 (moderate): Secondary | ICD-10-CM | POA: Diagnosis not present

## 2019-01-11 DIAGNOSIS — D631 Anemia in chronic kidney disease: Secondary | ICD-10-CM

## 2019-01-11 DIAGNOSIS — Z5112 Encounter for antineoplastic immunotherapy: Secondary | ICD-10-CM | POA: Diagnosis not present

## 2019-01-11 DIAGNOSIS — D630 Anemia in neoplastic disease: Secondary | ICD-10-CM | POA: Diagnosis not present

## 2019-01-11 DIAGNOSIS — Z9484 Stem cells transplant status: Secondary | ICD-10-CM | POA: Diagnosis not present

## 2019-01-11 DIAGNOSIS — Z79899 Other long term (current) drug therapy: Secondary | ICD-10-CM | POA: Diagnosis not present

## 2019-01-11 DIAGNOSIS — M899 Disorder of bone, unspecified: Secondary | ICD-10-CM

## 2019-01-11 DIAGNOSIS — M898X9 Other specified disorders of bone, unspecified site: Secondary | ICD-10-CM

## 2019-01-11 LAB — CMP (CANCER CENTER ONLY)
ALT: 14 U/L (ref 0–44)
AST: 15 U/L (ref 15–41)
Albumin: 3.5 g/dL (ref 3.5–5.0)
Alkaline Phosphatase: 47 U/L (ref 38–126)
Anion gap: 7 (ref 5–15)
BUN: 19 mg/dL (ref 8–23)
CO2: 28 mmol/L (ref 22–32)
Calcium: 9 mg/dL (ref 8.9–10.3)
Chloride: 100 mmol/L (ref 98–111)
Creatinine: 1.02 mg/dL (ref 0.61–1.24)
GFR, Est AFR Am: >60 mL/min (ref >60)
GFR, Estimated: >60 mL/min (ref >60)
Glucose, Bld: 111 mg/dL — ABNORMAL HIGH (ref 70–99)
Potassium: 3.7 mmol/L (ref 3.5–5.1)
Sodium: 135 mmol/L (ref 135–145)
Total Bilirubin: 0.7 mg/dL (ref 0.3–1.2)
Total Protein: 5.4 g/dL — ABNORMAL LOW (ref 6.5–8.1)

## 2019-01-11 LAB — CBC WITH DIFFERENTIAL (CANCER CENTER ONLY)
Abs Immature Granulocytes: 0.02 10*3/uL (ref 0.00–0.07)
Basophils Absolute: 0 10*3/uL (ref 0.0–0.1)
Basophils Relative: 0 %
Eosinophils Absolute: 0.1 10*3/uL (ref 0.0–0.5)
Eosinophils Relative: 1 %
HCT: 32.8 % — ABNORMAL LOW (ref 39.0–52.0)
Hemoglobin: 11 g/dL — ABNORMAL LOW (ref 13.0–17.0)
Immature Granulocytes: 0 %
Lymphocytes Relative: 21 %
Lymphs Abs: 1 10*3/uL (ref 0.7–4.0)
MCH: 32.3 pg (ref 26.0–34.0)
MCHC: 33.5 g/dL (ref 30.0–36.0)
MCV: 96.2 fL (ref 80.0–100.0)
Monocytes Absolute: 0.6 10*3/uL (ref 0.1–1.0)
Monocytes Relative: 12 %
Neutro Abs: 3.2 10*3/uL (ref 1.7–7.7)
Neutrophils Relative %: 66 %
Platelet Count: 144 10*3/uL — ABNORMAL LOW (ref 150–400)
RBC: 3.41 MIL/uL — ABNORMAL LOW (ref 4.22–5.81)
RDW: 15.6 % — ABNORMAL HIGH (ref 11.5–15.5)
WBC Count: 4.9 10*3/uL (ref 4.0–10.5)
nRBC: 0 % (ref 0.0–0.2)

## 2019-01-11 LAB — IRON AND TIBC
Iron: 151 ug/dL (ref 42–163)
Saturation Ratios: 64 % — ABNORMAL HIGH (ref 20–55)
TIBC: 235 ug/dL (ref 202–409)
UIBC: 84 ug/dL — ABNORMAL LOW (ref 117–376)

## 2019-01-11 LAB — RETICULOCYTES
Immature Retic Fract: 5.9 % (ref 2.3–15.9)
RBC.: 3.41 MIL/uL — ABNORMAL LOW (ref 4.22–5.81)
Retic Count, Absolute: 30.3 10*3/uL (ref 19.0–186.0)
Retic Ct Pct: 0.9 % (ref 0.4–3.1)

## 2019-01-11 LAB — FERRITIN: Ferritin: 1934 ng/mL — ABNORMAL HIGH (ref 24–336)

## 2019-01-11 MED ORDER — SODIUM CHLORIDE 0.9 % IV SOLN
INTRAVENOUS | Status: DC
  Filled 2019-01-11: qty 250

## 2019-01-11 MED ORDER — SODIUM CHLORIDE 0.9 % IV SOLN
INTRAVENOUS | Status: DC
  Administered 2019-01-11: 09:00:00 via INTRAVENOUS
  Filled 2019-01-11: qty 250

## 2019-01-11 MED ORDER — SODIUM CHLORIDE 0.9 % IV SOLN
16.3000 mg/kg | Freq: Once | INTRAVENOUS | Status: AC
  Administered 2019-01-11: 1500 mg via INTRAVENOUS
  Filled 2019-01-11: qty 60

## 2019-01-11 MED ORDER — SODIUM CHLORIDE 0.9 % IV SOLN
Freq: Once | INTRAVENOUS | Status: DC
  Filled 2019-01-11: qty 250

## 2019-01-11 MED ORDER — HEPARIN SOD (PORK) LOCK FLUSH 100 UNIT/ML IV SOLN
250.0000 [IU] | Freq: Once | INTRAVENOUS | Status: DC | PRN
  Filled 2019-01-11: qty 5

## 2019-01-11 MED ORDER — ZOLEDRONIC ACID 4 MG/100ML IV SOLN
4.0000 mg | Freq: Once | INTRAVENOUS | Status: AC
  Administered 2019-01-11: 4 mg via INTRAVENOUS
  Filled 2019-01-11: qty 100

## 2019-01-11 MED ORDER — SODIUM CHLORIDE 0.9% FLUSH
10.0000 mL | INTRAVENOUS | Status: DC | PRN
  Administered 2019-01-11: 13:00:00 10 mL
  Filled 2019-01-11: qty 10

## 2019-01-11 MED ORDER — ACETAMINOPHEN 325 MG PO TABS
650.0000 mg | ORAL_TABLET | Freq: Once | ORAL | Status: AC
  Administered 2019-01-11: 10:00:00 650 mg via ORAL

## 2019-01-11 MED ORDER — METHYLPREDNISOLONE SODIUM SUCC 125 MG IJ SOLR
100.0000 mg | Freq: Once | INTRAMUSCULAR | Status: AC
  Administered 2019-01-11: 100 mg via INTRAVENOUS

## 2019-01-11 MED ORDER — HEPARIN SOD (PORK) LOCK FLUSH 100 UNIT/ML IV SOLN
500.0000 [IU] | Freq: Once | INTRAVENOUS | Status: AC | PRN
  Administered 2019-01-11: 500 [IU]
  Filled 2019-01-11: qty 5

## 2019-01-11 MED ORDER — DIPHENHYDRAMINE HCL 25 MG PO CAPS
50.0000 mg | ORAL_CAPSULE | Freq: Once | ORAL | Status: AC
  Administered 2019-01-11: 50 mg via ORAL

## 2019-01-11 MED ORDER — SODIUM CHLORIDE 0.9% FLUSH
3.0000 mL | INTRAVENOUS | Status: DC | PRN
  Filled 2019-01-11: qty 10

## 2019-01-11 MED ORDER — METHYLPREDNISOLONE SODIUM SUCC 125 MG IJ SOLR
INTRAMUSCULAR | Status: AC
  Filled 2019-01-11: qty 2

## 2019-01-11 MED ORDER — SODIUM CHLORIDE 0.9% FLUSH
3.0000 mL | Freq: Once | INTRAVENOUS | Status: DC | PRN
  Filled 2019-01-11: qty 10

## 2019-01-11 MED ORDER — DIPHENHYDRAMINE HCL 25 MG PO CAPS
ORAL_CAPSULE | ORAL | Status: AC
  Filled 2019-01-11: qty 2

## 2019-01-11 MED ORDER — ACETAMINOPHEN 325 MG PO TABS
ORAL_TABLET | ORAL | Status: AC
  Filled 2019-01-11: qty 2

## 2019-01-11 NOTE — Progress Notes (Signed)
Dr. Marin Olp informed of BP value at 11:22. He verbally ordered for the patient to take 0.2 mg clonidine from the medication that the patient  brought from home. Pt. took two 0.1 mg tablets, total of 0.2 mg clonidine  At 11:25. Will continue to monitor BP.

## 2019-01-11 NOTE — Telephone Encounter (Signed)
Appointments scheduled calendar printed per 8/19 los 

## 2019-01-11 NOTE — Progress Notes (Signed)
Hematology and Oncology Follow Up Visit  Robert [name] [id number] Apr 07, 1934 85 y.o. 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 3 weeks for hemoglobin less than 10   Interim History:  Robert [name] is here today for follow-up.  So far, he is tolerated the daratumumab pretty well.  He really has had no problems with this.  As always, his blood pressure is quite high.  He says he is taking his blood pressure medication.  He is now taking the Lasix or Zaroxolyn.  He says the swelling in his legs is a lot better.  He is on clonidine 0.1 mg daily.  I told him to do this twice a day now.  He has had no headache.  He has had no problems with cough or shortness of breath.  There is no bleeding.  He has had no rashes.  There is been no nausea or vomiting.  Thankfully, his myeloma studies have been nice and low and steady.  His M spike is 0.3 g/dL.  His IgG level is 865 mg/dL.  His kappa light chain 0.8 mg/dL.  I told him that as long as the myeloma levels are this low, there will never be a problem with it.  Clearly, his biggest problem will be his blood pressure control and whether or not his kidneys fail at some point.  Currently, I would say performance status is ECOG 1.   Medications:  Allergies as of 01/11/2019   No Known Allergies     Medication List       Accurate as of January 11, 2019  8:54 AM. If you have any questions, ask your nurse or doctor.        aspirin 81 MG tablet Take 81 mg by mouth daily.   bimatoprost 0.01 % Soln Commonly known as: LUMIGAN Place 1 drop  into both eyes at bedtime.   CALTRATE 600+D PLUS PO Take 1 tablet by mouth daily.   carvedilol 12.5 MG tablet Commonly known as: COREG Take 1 tablet (12.5 mg total) by mouth 2 (two) times daily with a meal.   cloNIDine 0.1 MG tablet Commonly known as: CATAPRES Take 1 tablet (0.1 mg total) by mouth 2 (two) times daily. Take an extra tablet as needed for blood pressure greater than 170/100 What changed:   when to take this  reasons to take this   [employer] VITAMIN B12 2000 MCG tablet Generic drug: cyanocobalamin Take 2,000 mcg by mouth at bedtime.   famciclovir 250 MG tablet Commonly known as: FAMVIR Take 1 tablet (250 mg total) by mouth daily.   furosemide 20 MG tablet Commonly known as: LASIX Take 2 tablets (40 mg total) by mouth daily.   HCA LAX-X PO Take by mouth every three (3) days as needed.   hydrochlorothiazide 25 MG tablet Commonly known as: HYDRODIURIL 25 mg daily.   losartan 100 MG tablet Commonly known as: COZAAR Take 100 mg by mouth daily.   metolazone 5 MG tablet Commonly known as: ZAROXOLYN Take 1 pill 1 hour prior to taking Lasix daily  multivitamin with minerals Tabs tablet Take 1 tablet by mouth daily.   potassium chloride SA 20 MEQ tablet Commonly known as: K-DUR Take 20 mEq by mouth daily.   pravastatin 20 MG tablet Commonly known as: PRAVACHOL Take 20 mg by mouth at bedtime.   SYSTANE OP Place 1 drop into both eyes daily as needed (dry eyes).   traMADol 50 MG tablet Commonly known as: ULTRAM Take 1 tablet (50 mg total) by mouth every 6 (six) hours as needed.   Vitamin D3 25 MCG (1000 UT) Caps Take 1,000 Units by mouth daily.   Zometa 4 MG/100ML IVPB Generic drug: Zoledronic Acid Inject 4 mg into the vein every 4 (four) months. Administered by Dr. Jonette Eva at [facility] - last infusion mid July 2019       Allergies: No Known Allergies  Past Medical History, Surgical history, Social history, and Family History were  reviewed and updated.  Review of Systems: Review of Systems  Constitutional: Negative.   HENT: Negative.   Eyes: Negative.   Respiratory: Negative.   Cardiovascular: Positive for leg swelling.  Gastrointestinal: Positive for heartburn.  Genitourinary: Negative.   Musculoskeletal: Positive for joint pain.  Skin: Negative.   Neurological: Negative.   Endo/Heme/Allergies: Negative.   Psychiatric/Behavioral: Negative.      Physical Exam:  weight is 201 lb (91.2 kg). His temporal temperature is 97.5 F (36.4 C) (abnormal). His blood pressure is 175/91 (abnormal) and his pulse is 63. His respiration is 18 and oxygen saturation is 94%.   Wt Readings from Last 3 Encounters:  01/11/19 201 lb (91.2 kg)  12/30/18 209 lb (94.8 kg)  12/28/18 203 lb (92.1 kg)    Physical Exam Vitals signs reviewed.  HENT:     Head: Normocephalic and atraumatic.  Eyes:     Pupils: Pupils are equal, round, and reactive to light.  Neck:     Musculoskeletal: Normal range of motion.  Cardiovascular:     Rate and Rhythm: Normal rate and regular rhythm.     Heart sounds: Normal heart sounds.  Pulmonary:     Effort: Pulmonary effort is normal.     Breath sounds: Normal breath sounds.  Abdominal:     General: Bowel sounds are normal.     Palpations: Abdomen is soft.     Comments: Abdominal exam is obese but soft.  He has good bowel sounds.  There is no fluid wave.  There is no palpable liver or spleen tip.  Musculoskeletal: Normal range of motion.        General: No tenderness or deformity.     Comments: Extremities shows 1+ edema in his lower legs and feet.  This is mildly pitting.  He has decent range of motion of his joints.  He has decent strength.  Lymphadenopathy:     Cervical: No cervical adenopathy.  Skin:    General: Skin is warm and dry.     Findings: No erythema or rash.  Neurological:     Mental Status: He is alert and oriented to person, place, and time.  Psychiatric:        Behavior:  Behavior normal.        Thought Content: Thought content normal.        Judgment: Judgment normal.      Lab Results  Component Value Date   WBC 4.9 01/11/2019   HGB 11.0 (L) 01/11/2019   HCT 32.8 (L) 01/11/2019   MCV 96.2 01/11/2019   PLT 144 (  L) 01/11/2019   Lab Results  Component Value Date   FERRITIN 1,413 (H) 12/14/2018   IRON 104 12/14/2018   TIBC 225 12/14/2018   UIBC 121 12/14/2018   IRONPCTSAT 46 12/14/2018   Lab Results  Component Value Date   RETICCTPCT 0.9 01/11/2019   RBC 3.41 (L) 01/11/2019   RBC 3.41 (L) 01/11/2019   Lab Results  Component Value Date   KPAFRELGTCHN 7.9 12/21/2018   LAMBDASER 3.8 (L) 12/21/2018   KAPLAMBRATIO 2.08 (H) 12/21/2018   Lab Results  Component Value Date   IGGSERUM 853 12/21/2018   IGGSERUM 925 12/21/2018   IGA 17 (L) 12/21/2018   IGA 18 (L) 12/21/2018   IGMSERUM 30 12/21/2018   IGMSERUM 39 12/21/2018   Lab Results  Component Value Date   TOTALPROTELP 5.8 (L) 12/21/2018   ALBUMINELP 3.4 12/21/2018   A1GS 0.2 12/21/2018   A2GS 0.8 12/21/2018   BETS 0.7 12/21/2018   BETA2SER 0.3 01/03/2015   GAMS 0.7 12/21/2018   MSPIKE 0.3 (H) 12/21/2018   SPEI Comment 10/20/2018     Chemistry      Component Value Date/Time   NA 131 (L) 01/04/2019 0820   NA 137 04/08/2017 1141   NA 138 10/29/2015 1029   K 3.2 (L) 01/04/2019 0820   K 3.4 04/08/2017 1141   K 3.9 10/29/2015 1029   CL 94 (L) 01/04/2019 0820   CL 102 04/08/2017 1141   CO2 29 01/04/2019 0820   CO2 30 04/08/2017 1141   CO2 27 10/29/2015 1029   BUN 23 01/04/2019 0820   BUN 16 04/08/2017 1141   BUN 13.8 10/29/2015 1029   CREATININE 1.18 01/04/2019 0820   CREATININE 0.93 12/28/2018 0850   CREATININE 1.3 (H) 04/08/2017 1141   CREATININE 1.1 10/29/2015 1029      Component Value Date/Time   CALCIUM 8.7 (L) 01/04/2019 0820   CALCIUM 9.3 04/08/2017 1141   CALCIUM 9.3 10/29/2015 1029   ALKPHOS 52 01/04/2019 0820   ALKPHOS 66 04/08/2017 1141   ALKPHOS 52  10/29/2015 1029   AST 13 (L) 01/04/2019 0820   AST 13 (L) 12/28/2018 0850   AST 20 10/29/2015 1029   ALT 10 01/04/2019 0820   ALT 11 12/28/2018 0850   ALT 21 04/08/2017 1141   ALT 14 10/29/2015 1029   BILITOT 0.7 01/04/2019 0820   BILITOT 0.6 12/28/2018 0850   BILITOT 0.75 10/29/2015 1029       Impression and Plan: Robert [name] is a very pleasant 85 yo African American gentleman with relapsedIgG kappamyeloma.He has a history of stem cell transplant back in 2006.  It is apparent that the daratumumab is helping quite a bit.  Again I am just happy that his myeloma is responding that his quality of life is doing better.    He is not nearly have as much swelling in his legs.  We will plan to get him back in another month.  That will be his fourth cycle of treatment.      Volanda Napoleon, MD 8/19/20208:54 AM

## 2019-01-11 NOTE — Patient Instructions (Signed)
Daratumumab injection What is this medicine? DARATUMUMAB (dar a toom ue mab) is a monoclonal antibody. It is used to treat multiple myeloma. This medicine may be used for other purposes; ask your health care provider or pharmacist if you have questions. COMMON BRAND NAME(S): DARZALEX What should I tell my health care provider before I take this medicine? They need to know if you have any of these conditions:  infection (especially a virus infection such as chickenpox, herpes, or hepatitis B virus)  lung or breathing disease  an unusual or allergic reaction to daratumumab, other medicines, foods, dyes, or preservatives  pregnant or trying to get pregnant  breast-feeding How should I use this medicine? This medicine is for infusion into a vein. It is given by a health care professional in a hospital or clinic setting. Talk to your pediatrician regarding the use of this medicine in children. Special care may be needed. Overdosage: If you think you have taken too much of this medicine contact a poison control center or emergency room at once. NOTE: This medicine is only for you. Do not share this medicine with others. What if I miss a dose? Keep appointments for follow-up doses as directed. It is important not to miss your dose. Call your doctor or health care professional if you are unable to keep an appointment. What may interact with this medicine? Interactions have not been studied. This list may not describe all possible interactions. Give your health care provider a list of all the medicines, herbs, non-prescription drugs, or dietary supplements you use. Also tell them if you smoke, drink alcohol, or use illegal drugs. Some items may interact with your medicine. What should I watch for while using this medicine? This drug may make you feel generally unwell. Report any side effects. Continue your course of treatment even though you feel ill unless your doctor tells you to stop. This  medicine can cause serious allergic reactions. To reduce your risk you may need to take medicine before treatment with this medicine. Take your medicine as directed. This medicine can affect the results of blood tests to match your blood type. These changes can last for up to 6 months after the final dose. Your healthcare provider will do blood tests to match your blood type before you start treatment. Tell all of your healthcare providers that you are being treated with this medicine before receiving a blood transfusion. This medicine can affect the results of some tests used to determine treatment response; extra tests may be needed to evaluate response. Do not become pregnant while taking this medicine or for 3 months after stopping it. Women should inform their doctor if they wish to become pregnant or think they might be pregnant. There is a potential for serious side effects to an unborn child. Talk to your health care professional or pharmacist for more information. What side effects may I notice from receiving this medicine? Side effects that you should report to your doctor or health care professional as soon as possible:  allergic reactions like skin rash, itching or hives, swelling of the face, lips, or tongue  breathing problems  chills  cough  dizziness  feeling faint or lightheaded  headache  low blood counts - this medicine may decrease the number of white blood cells, red blood cells and platelets. You may be at increased risk for infections and bleeding.  nausea, vomiting  shortness of breath  signs of decreased platelets or bleeding - bruising, pinpoint red spots on  the skin, black, tarry stools, blood in the urine  signs of decreased red blood cells - unusually weak or tired, feeling faint or lightheaded, falls  signs of infection - fever or chills, cough, sore throat, pain or difficulty passing urine  signs and symptoms of liver injury like dark yellow or brown  urine; general ill feeling or flu-like symptoms; light-colored stools; loss of appetite; right upper belly pain; unusually weak or tired; yellowing of the eyes or skin Side effects that usually do not require medical attention (report to your doctor or health care professional if they continue or are bothersome):  back pain  constipation  loss of appetite  diarrhea  joint pain  muscle cramps  pain, tingling, numbness in the hands or feet  swelling of the ankles, feet, hands  tiredness  trouble sleeping This list may not describe all possible side effects. Call your doctor for medical advice about side effects. You may report side effects to FDA at 1-800-FDA-1088. Where should I keep my medicine? Keep out of the reach of children. This drug is given in a hospital or clinic and will not be stored at home. NOTE: This sheet is a summary. It may not cover all possible information. If you have questions about this medicine, talk to your doctor, pharmacist, or health care provider.  2020 Elsevier/Gold Standard (2018-02-24 14:00:48)

## 2019-01-12 LAB — KAPPA/LAMBDA LIGHT CHAINS
Kappa free light chain: 9.1 mg/L (ref 3.3–19.4)
Kappa, lambda light chain ratio: 1.57 (ref 0.26–1.65)
Lambda free light chains: 5.8 mg/L (ref 5.7–26.3)

## 2019-01-12 LAB — IGG, IGA, IGM
IgA: 16 mg/dL — ABNORMAL LOW (ref 61–437)
IgG (Immunoglobin G), Serum: 795 mg/dL (ref 603–1613)
IgM (Immunoglobulin M), Srm: 33 mg/dL (ref 15–143)

## 2019-01-13 LAB — PROTEIN ELECTROPHORESIS, SERUM
A/G Ratio: 1.5 (ref 0.7–1.7)
Albumin ELP: 3.4 g/dL (ref 2.9–4.4)
Alpha-1-Globulin: 0.2 g/dL (ref 0.0–0.4)
Alpha-2-Globulin: 0.8 g/dL (ref 0.4–1.0)
Beta Globulin: 0.7 g/dL (ref 0.7–1.3)
Gamma Globulin: 0.6 g/dL (ref 0.4–1.8)
Globulin, Total: 2.2 g/dL (ref 2.2–3.9)
M-Spike, %: 0.2 g/dL — ABNORMAL HIGH
Total Protein ELP: 5.6 g/dL — ABNORMAL LOW (ref 6.0–8.5)

## 2019-01-18 ENCOUNTER — Ambulatory Visit: Payer: Medicare Other

## 2019-01-18 ENCOUNTER — Other Ambulatory Visit: Payer: Medicare Other

## 2019-01-24 ENCOUNTER — Other Ambulatory Visit: Payer: Self-pay | Admitting: Family

## 2019-01-24 DIAGNOSIS — C9000 Multiple myeloma not having achieved remission: Secondary | ICD-10-CM

## 2019-01-25 ENCOUNTER — Inpatient Hospital Stay: Payer: Medicare Other

## 2019-01-25 ENCOUNTER — Inpatient Hospital Stay: Payer: Medicare Other | Attending: Hematology & Oncology

## 2019-01-25 ENCOUNTER — Other Ambulatory Visit: Payer: Self-pay

## 2019-01-25 VITALS — Wt 200.1 lb

## 2019-01-25 DIAGNOSIS — Z5112 Encounter for antineoplastic immunotherapy: Secondary | ICD-10-CM | POA: Diagnosis not present

## 2019-01-25 DIAGNOSIS — C9002 Multiple myeloma in relapse: Secondary | ICD-10-CM | POA: Insufficient documentation

## 2019-01-25 DIAGNOSIS — D630 Anemia in neoplastic disease: Secondary | ICD-10-CM | POA: Insufficient documentation

## 2019-01-25 DIAGNOSIS — Z79899 Other long term (current) drug therapy: Secondary | ICD-10-CM | POA: Insufficient documentation

## 2019-01-25 DIAGNOSIS — C9000 Multiple myeloma not having achieved remission: Secondary | ICD-10-CM

## 2019-01-25 LAB — CBC WITH DIFFERENTIAL (CANCER CENTER ONLY)
Abs Immature Granulocytes: 0.01 10*3/uL (ref 0.00–0.07)
Basophils Absolute: 0 10*3/uL (ref 0.0–0.1)
Basophils Relative: 0 %
Eosinophils Absolute: 0.1 10*3/uL (ref 0.0–0.5)
Eosinophils Relative: 2 %
HCT: 32.9 % — ABNORMAL LOW (ref 39.0–52.0)
Hemoglobin: 11.2 g/dL — ABNORMAL LOW (ref 13.0–17.0)
Immature Granulocytes: 0 %
Lymphocytes Relative: 24 %
Lymphs Abs: 1.3 10*3/uL (ref 0.7–4.0)
MCH: 32.9 pg (ref 26.0–34.0)
MCHC: 34 g/dL (ref 30.0–36.0)
MCV: 96.8 fL (ref 80.0–100.0)
Monocytes Absolute: 0.6 10*3/uL (ref 0.1–1.0)
Monocytes Relative: 12 %
Neutro Abs: 3.4 10*3/uL (ref 1.7–7.7)
Neutrophils Relative %: 62 %
Platelet Count: 193 10*3/uL (ref 150–400)
RBC: 3.4 MIL/uL — ABNORMAL LOW (ref 4.22–5.81)
RDW: 14.7 % (ref 11.5–15.5)
WBC Count: 5.5 10*3/uL (ref 4.0–10.5)
nRBC: 0 % (ref 0.0–0.2)

## 2019-01-25 LAB — SAMPLE TO BLOOD BANK

## 2019-01-25 LAB — CMP (CANCER CENTER ONLY)
ALT: 12 U/L (ref 0–44)
AST: 15 U/L (ref 15–41)
Albumin: 3.8 g/dL (ref 3.5–5.0)
Alkaline Phosphatase: 49 U/L (ref 38–126)
Anion gap: 8 (ref 5–15)
BUN: 20 mg/dL (ref 8–23)
CO2: 30 mmol/L (ref 22–32)
Calcium: 9.7 mg/dL (ref 8.9–10.3)
Chloride: 99 mmol/L (ref 98–111)
Creatinine: 1.1 mg/dL (ref 0.61–1.24)
GFR, Est AFR Am: >60 mL/min (ref >60)
GFR, Estimated: >60 mL/min (ref >60)
Glucose, Bld: 121 mg/dL — ABNORMAL HIGH (ref 70–99)
Potassium: 3.3 mmol/L — ABNORMAL LOW (ref 3.5–5.1)
Sodium: 137 mmol/L (ref 135–145)
Total Bilirubin: 0.7 mg/dL (ref 0.3–1.2)
Total Protein: 6.2 g/dL — ABNORMAL LOW (ref 6.5–8.1)

## 2019-01-25 MED ORDER — METHYLPREDNISOLONE SODIUM SUCC 125 MG IJ SOLR
INTRAMUSCULAR | Status: AC
  Filled 2019-01-25: qty 2

## 2019-01-25 MED ORDER — METHYLPREDNISOLONE SODIUM SUCC 125 MG IJ SOLR
100.0000 mg | Freq: Once | INTRAMUSCULAR | Status: AC
  Administered 2019-01-25: 100 mg via INTRAVENOUS

## 2019-01-25 MED ORDER — HEPARIN SOD (PORK) LOCK FLUSH 100 UNIT/ML IV SOLN
500.0000 [IU] | Freq: Once | INTRAVENOUS | Status: AC | PRN
  Administered 2019-01-25: 12:00:00 500 [IU]
  Filled 2019-01-25: qty 5

## 2019-01-25 MED ORDER — SODIUM CHLORIDE 0.9% FLUSH
10.0000 mL | INTRAVENOUS | Status: DC | PRN
  Administered 2019-01-25: 12:00:00 10 mL
  Filled 2019-01-25: qty 10

## 2019-01-25 MED ORDER — DIPHENHYDRAMINE HCL 25 MG PO CAPS
ORAL_CAPSULE | ORAL | Status: AC
  Filled 2019-01-25: qty 2

## 2019-01-25 MED ORDER — ACETAMINOPHEN 325 MG PO TABS
650.0000 mg | ORAL_TABLET | Freq: Once | ORAL | Status: AC
  Administered 2019-01-25: 650 mg via ORAL

## 2019-01-25 MED ORDER — DIPHENHYDRAMINE HCL 25 MG PO CAPS
50.0000 mg | ORAL_CAPSULE | Freq: Once | ORAL | Status: AC
  Administered 2019-01-25: 50 mg via ORAL

## 2019-01-25 MED ORDER — SODIUM CHLORIDE 0.9 % IV SOLN
16.3000 mg/kg | Freq: Once | INTRAVENOUS | Status: AC
  Administered 2019-01-25: 1500 mg via INTRAVENOUS
  Filled 2019-01-25: qty 60

## 2019-01-25 MED ORDER — ACETAMINOPHEN 325 MG PO TABS
ORAL_TABLET | ORAL | Status: AC
  Filled 2019-01-25: qty 2

## 2019-01-25 MED ORDER — SODIUM CHLORIDE 0.9 % IV SOLN
Freq: Once | INTRAVENOUS | Status: AC
  Administered 2019-01-25: 09:00:00 via INTRAVENOUS
  Filled 2019-01-25: qty 250

## 2019-01-25 NOTE — Patient Instructions (Signed)

## 2019-01-25 NOTE — Patient Instructions (Signed)
Daratumumab injection What is this medicine? DARATUMUMAB (dar a toom ue mab) is a monoclonal antibody. It is used to treat multiple myeloma. This medicine may be used for other purposes; ask your health care provider or pharmacist if you have questions. COMMON BRAND NAME(S): DARZALEX What should I tell my health care provider before I take this medicine? They need to know if you have any of these conditions:  infection (especially a virus infection such as chickenpox, herpes, or hepatitis B virus)  lung or breathing disease  an unusual or allergic reaction to daratumumab, other medicines, foods, dyes, or preservatives  pregnant or trying to get pregnant  breast-feeding How should I use this medicine? This medicine is for infusion into a vein. It is given by a health care professional in a hospital or clinic setting. Talk to your pediatrician regarding the use of this medicine in children. Special care may be needed. Overdosage: If you think you have taken too much of this medicine contact a poison control center or emergency room at once. NOTE: This medicine is only for you. Do not share this medicine with others. What if I miss a dose? Keep appointments for follow-up doses as directed. It is important not to miss your dose. Call your doctor or health care professional if you are unable to keep an appointment. What may interact with this medicine? Interactions have not been studied. This list may not describe all possible interactions. Give your health care provider a list of all the medicines, herbs, non-prescription drugs, or dietary supplements you use. Also tell them if you smoke, drink alcohol, or use illegal drugs. Some items may interact with your medicine. What should I watch for while using this medicine? This drug may make you feel generally unwell. Report any side effects. Continue your course of treatment even though you feel ill unless your doctor tells you to stop. This  medicine can cause serious allergic reactions. To reduce your risk you may need to take medicine before treatment with this medicine. Take your medicine as directed. This medicine can affect the results of blood tests to match your blood type. These changes can last for up to 6 months after the final dose. Your healthcare provider will do blood tests to match your blood type before you start treatment. Tell all of your healthcare providers that you are being treated with this medicine before receiving a blood transfusion. This medicine can affect the results of some tests used to determine treatment response; extra tests may be needed to evaluate response. Do not become pregnant while taking this medicine or for 3 months after stopping it. Women should inform their doctor if they wish to become pregnant or think they might be pregnant. There is a potential for serious side effects to an unborn child. Talk to your health care professional or pharmacist for more information. What side effects may I notice from receiving this medicine? Side effects that you should report to your doctor or health care professional as soon as possible:  allergic reactions like skin rash, itching or hives, swelling of the face, lips, or tongue  breathing problems  chills  cough  dizziness  feeling faint or lightheaded  headache  low blood counts - this medicine may decrease the number of white blood cells, red blood cells and platelets. You may be at increased risk for infections and bleeding.  nausea, vomiting  shortness of breath  signs of decreased platelets or bleeding - bruising, pinpoint red spots on  the skin, black, tarry stools, blood in the urine  signs of decreased red blood cells - unusually weak or tired, feeling faint or lightheaded, falls  signs of infection - fever or chills, cough, sore throat, pain or difficulty passing urine  signs and symptoms of liver injury like dark yellow or brown  urine; general ill feeling or flu-like symptoms; light-colored stools; loss of appetite; right upper belly pain; unusually weak or tired; yellowing of the eyes or skin Side effects that usually do not require medical attention (report to your doctor or health care professional if they continue or are bothersome):  back pain  constipation  loss of appetite  diarrhea  joint pain  muscle cramps  pain, tingling, numbness in the hands or feet  swelling of the ankles, feet, hands  tiredness  trouble sleeping This list may not describe all possible side effects. Call your doctor for medical advice about side effects. You may report side effects to FDA at 1-800-FDA-1088. Where should I keep my medicine? Keep out of the reach of children. This drug is given in a hospital or clinic and will not be stored at home. NOTE: This sheet is a summary. It may not cover all possible information. If you have questions about this medicine, talk to your doctor, pharmacist, or health care provider.  2020 Elsevier/Gold Standard (2018-02-24 14:00:48)

## 2019-02-01 ENCOUNTER — Other Ambulatory Visit: Payer: Medicare Other

## 2019-02-01 ENCOUNTER — Ambulatory Visit: Payer: Medicare Other

## 2019-02-07 ENCOUNTER — Other Ambulatory Visit: Payer: Self-pay

## 2019-02-07 ENCOUNTER — Encounter: Payer: Self-pay | Admitting: Cardiology

## 2019-02-07 ENCOUNTER — Ambulatory Visit (INDEPENDENT_AMBULATORY_CARE_PROVIDER_SITE_OTHER): Payer: Medicare Other | Admitting: Cardiology

## 2019-02-07 VITALS — BP 200/96 | HR 68 | Ht 71.0 in | Wt 203.8 lb

## 2019-02-07 DIAGNOSIS — M7989 Other specified soft tissue disorders: Secondary | ICD-10-CM

## 2019-02-07 DIAGNOSIS — I10 Essential (primary) hypertension: Secondary | ICD-10-CM | POA: Diagnosis not present

## 2019-02-07 DIAGNOSIS — C9002 Multiple myeloma in relapse: Secondary | ICD-10-CM | POA: Diagnosis not present

## 2019-02-07 MED ORDER — CLONIDINE HCL 0.1 MG PO TABS: 0.1000 mg | ORAL_TABLET | Freq: Every day | ORAL | 1 refills | Status: DC

## 2019-02-07 NOTE — Progress Notes (Signed)
Cardiology Office Note:    Date:  02/07/2019   ID:  Robert [name], DOB June 03, 1933, MRN [id number]  PCP:  Robert Battles, MD  Cardiologist:  Robert Campus, MD    Referring MD: Robert Battles, MD   Chief Complaint  Patient presents with  . Follow-up  Doing well but have problem by blood pressure  History of Present Illness:    Robert [name] is a 85 y.o. male who was originally referred to [place] because of swelling of lower extremities.  Adjustment to his medication has been made and there is no problem now however the problem that he facing right now his blood pressure fluctuating a lot.  He tells me that this morning before he left home he checked his blood pressure and he systolic blood pressure was 115.  He is in our office today and his blood pressure is 200 we tried to figure out what the problem is and I think I identified one potential explanation for his high fluctuation of the blood pressure.  He regulate medication himself so there will be days that he will take 0.4 mg of clonidine and then he will skip clonidine for 2 days.  I explained to him that stopping clonidine abruptly can create rebound effect and make his blood pressure much higher than before he started with and I stressed the importance of taking medication on the regular basis.  Therefore I want him to take Coreg as well as Cozaar regardless on what his blood pressure is also want him to take 0.1 mg clonidine regardless what his blood pressure is I want him to document his blood pressure on a regular basis we contact him over the phone within next 2 weeks and then see if there is any adjustment that needed to be done I asked him not to stop his medications.  Past Medical History:  Diagnosis Date  . Anemia of chronic renal failure, stage 3 (moderate) ([place]) 10/06/2018  . Arthritis   . 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 Goals of care, counseling/discussion 05/12/2018  . Hyperlipidemia   . Hypertension   .  Iron deficiency anemia 10/07/2018  . 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 Multiple myeloma ([place])    2006    Past Surgical History:  Procedure Laterality Date  . CATARACT EXTRACTION BILATERAL W/ ANTERIOR VITRECTOMY    . IR IMAGING GUIDED PORT INSERTION  05/30/2018  . LIMBAL STEM CELL TRANSPLANT      Current Medications: Current Meds  Medication Sig  . aspirin 81 MG tablet Take 81 mg by mouth daily.  . bimatoprost (LUMIGAN) 0.01 % SOLN Place 1 drop into both eyes at bedtime.   . Calcium Carbonate-Vit D-Min (CALTRATE 600+D PLUS PO) Take 1 tablet by mouth daily.  . carvedilol (COREG) 12.5 MG tablet Take 1 tablet (12.5 mg total) by mouth 2 (two) times daily with a meal.  . Cholecalciferol (VITAMIN D3) 1000 UNITS CAPS Take 1,000 Units by mouth daily.   . cloNIDine (CATAPRES) 0.1 MG tablet Take 1 tablet (0.1 mg total) by mouth 2 (two) times daily. Take an extra tablet as needed for blood pressure greater than 170/100 (Patient taking differently: Take 0.1 mg by mouth as needed. Take an extra tablet as needed for blood pressure greater than 170/100)  . cyanocobalamin ([employer]S VITAMIN B12) 2000 MCG tablet Take 2,000 mcg by mouth at bedtime.   . famciclovir (FAMVIR) 250 MG tablet Take 1 tablet (250 mg total) by mouth daily.  . furosemide (LASIX) 20  MG tablet Take 2 tablets (40 mg total) by mouth daily.  . hydrochlorothiazide (HYDRODIURIL) 25 MG tablet 25 mg daily.  Marland Kitchen losartan (COZAAR) 100 MG tablet Take 100 mg by mouth daily.  . metolazone (ZAROXOLYN) 5 MG tablet Take 1 pill 1 hour prior to taking Lasix daily  . Multiple Vitamin (MULITIVITAMIN WITH MINERALS) TABS Take 1 tablet by mouth daily.  Robert [name] Glycol-Propyl Glycol (SYSTANE OP) Place 1 drop into both eyes daily as needed (dry eyes).   . potassium chloride SA (K-DUR,KLOR-CON) 20 MEQ tablet Take 20 mEq by mouth daily.  . pravastatin (PRAVACHOL) 20 MG tablet Take 20 mg by mouth at bedtime.  . Sennosides (HCA LAX-X PO) Take by mouth every three (3)  days as needed.  . traMADol (ULTRAM) 50 MG tablet Take 1 tablet (50 mg total) by mouth every 6 (six) hours as needed.  . Zoledronic Acid (ZOMETA) 4 MG/100ML IVPB Inject 4 mg into the vein every 4 (four) months. Administered by Dr. Jonette Eva at [facility] - last infusion mid July 2019     Allergies:   Patient has no known allergies.   Social History   Socioeconomic History  . Marital status: Married    Spouse name: Not on file  . Number of children: Not on file  . Years of education: Not on file  . Highest education level: Not on file  Occupational History  . Not on file  Social Needs  . Financial resource strain: Not on file  . Food insecurity    Worry: Not on file    Inability: Not on file  . Transportation needs    Medical: Not on file    Non-medical: Not on file  Tobacco Use  . Smoking status: Former Smoker    Packs/day: 4.00    Years: 9.00    Pack years: 36.00    Types: Cigars, Cigarettes    Start date: 07/08/1979    Quit date: 07/07/1988    Years since quitting: 30.6  . Smokeless tobacco: Never Used  . Tobacco comment: quit 24 years ago  Substance and Sexual Activity  . Alcohol use: Yes    Alcohol/week: 6.0 standard drinks    Types: 6 Glasses of wine per week    Comment: very seldom  . Drug use: No  . Sexual activity: Not on file  Lifestyle  . Physical activity    Days per week: Not on file    Minutes per session: Not on file  . Stress: Not on file  Relationships  . Social Herbalist on phone: Not on file    Gets together: Not on file    Attends religious service: Not on file    Active member of club or organization: Not on file    Attends meetings of clubs or organizations: Not on file    Relationship status: Not on file  Other Topics Concern  . Not on file  Social History Narrative  . Not on file     Family History: The patient's family history includes Diabetes in his brother; Heart attack in his father; Heart disease in his  father; Throat cancer in his mother. There is no history of Colon cancer. ROS:   Please see the history of present illness.    All 14 point review of systems negative except as described per history of present illness  EKGs/Labs/Other Studies Reviewed:      Recent Labs: 09/15/2018: B Natriuretic Peptide 76.7 01/25/2019: ALT  12; BUN 20; Creatinine 1.10; Hemoglobin 11.2; Platelet Count 193; Potassium 3.3; Sodium 137  Recent Lipid Panel No results found for: CHOL, TRIG, HDL, CHOLHDL, VLDL, LDLCALC, LDLDIRECT  Physical Exam:    VS:  BP (!) 200/96   Pulse 68   Ht 5' 11" (1.803 m)   Wt 203 lb 12.8 oz (92.4 kg)   SpO2 96%   BMI 28.42 kg/m     Wt Readings from Last 3 Encounters:  02/07/19 203 lb 12.8 oz (92.4 kg)  01/25/19 200 lb 1.3 oz (90.8 kg)  01/11/19 201 lb (91.2 kg)     GEN:  Well nourished, well developed in no acute distress HEENT: Normal NECK: No JVD; No carotid bruits LYMPHATICS: No lymphadenopathy CARDIAC: RRR, no murmurs, no rubs, no gallops RESPIRATORY:  Clear to auscultation without rales, wheezing or rhonchi  ABDOMEN: Soft, non-tender, non-distended MUSCULOSKELETAL:  No edema; No deformity  SKIN: Warm and dry LOWER EXTREMITIES: no swelling NEUROLOGIC:  Alert and oriented x 3 PSYCHIATRIC:  Normal affect   ASSESSMENT:    1. Essential hypertension   2. Swelling of both lower extremities   3. Multiple myeloma in relapse [facility])    PLAN:    In order of problems listed above:  1. Essential hypertension I suspect the reason for high fluctuating shin of his blood pressure is the fact that he played with dosages and decide himself how to take those medications. 2. Swelling of both lower extremities.  Gone there is no more swelling. 3. Multiple myeloma follow-up excellently localized by oncologist.   Medication Adjustments/Labs and Tests Ordered: Current medicines are reviewed at length with the patient today.  Concerns regarding medicines are outlined above.  No  orders of the defined types were placed in this encounter.  Medication changes: No orders of the defined types were placed in this encounter.   Signed, Park Liter, MD, [facility] 02/07/2019 9:59 AM    [place]

## 2019-02-07 NOTE — Patient Instructions (Signed)
Medication Instructions:  Your physician recommends that you continue on your current medications as directed. Please refer to the Current Medication list given to you today.  If you need a refill on your cardiac medications before your next appointment, please call your pharmacy.   Lab work: None.  If you have labs (blood work) drawn today and your tests are completely normal, you will receive your results only by: . MyChart Message (if you have MyChart) OR . A paper copy in the mail If you have any lab test that is abnormal or we need to change your treatment, we will call you to review the results.  Testing/Procedures: None.   Follow-Up: At [facility], you and your health needs are our priority.  As part of our continuing mission to provide you with exceptional heart care, we have created designated Provider Care Teams.  These Care Teams include your primary Cardiologist (physician) and Advanced Practice Providers (APPs -  Physician Assistants and Nurse Practitioners) who all work together to provide you with the care you need, when you need it. You will need a follow up appointment in 6 weeks.  Please call our office 2 months in advance to schedule this appointment.  You may see No primary care provider on file. or another member of our [facility] Provider Team in [place]: Brian Munley, MD . Rajan Revankar, MD  Any Other Special Instructions Will Be Listed Below (If Applicable).     

## 2019-02-07 NOTE — Addendum Note (Signed)
Addended by: Ashok Norris on: 02/07/2019 10:15 AM   Modules accepted: Orders

## 2019-02-08 ENCOUNTER — Inpatient Hospital Stay: Payer: Medicare Other

## 2019-02-08 ENCOUNTER — Inpatient Hospital Stay (HOSPITAL_BASED_OUTPATIENT_CLINIC_OR_DEPARTMENT_OTHER): Payer: Medicare Other | Admitting: Hematology & Oncology

## 2019-02-08 ENCOUNTER — Other Ambulatory Visit: Payer: Self-pay

## 2019-02-08 ENCOUNTER — Encounter: Payer: Self-pay | Admitting: Hematology & Oncology

## 2019-02-08 VITALS — BP 154/88 | HR 68 | Temp 97.7°F | Resp 18 | Wt 201.0 lb

## 2019-02-08 VITALS — BP 161/82 | HR 53 | Temp 97.7°F | Resp 18

## 2019-02-08 DIAGNOSIS — C9002 Multiple myeloma in relapse: Secondary | ICD-10-CM

## 2019-02-08 DIAGNOSIS — C9000 Multiple myeloma not having achieved remission: Secondary | ICD-10-CM

## 2019-02-08 DIAGNOSIS — D630 Anemia in neoplastic disease: Secondary | ICD-10-CM | POA: Diagnosis not present

## 2019-02-08 DIAGNOSIS — D631 Anemia in chronic kidney disease: Secondary | ICD-10-CM

## 2019-02-08 DIAGNOSIS — Z5112 Encounter for antineoplastic immunotherapy: Secondary | ICD-10-CM | POA: Diagnosis not present

## 2019-02-08 DIAGNOSIS — Z79899 Other long term (current) drug therapy: Secondary | ICD-10-CM | POA: Diagnosis not present

## 2019-02-08 LAB — RETICULOCYTES
Immature Retic Fract: 9 % (ref 2.3–15.9)
RBC.: 3.32 MIL/uL — ABNORMAL LOW (ref 4.22–5.81)
Retic Count, Absolute: 41.5 10*3/uL (ref 19.0–186.0)
Retic Ct Pct: 1.3 % (ref 0.4–3.1)

## 2019-02-08 LAB — CBC WITH DIFFERENTIAL (CANCER CENTER ONLY)
Abs Immature Granulocytes: 0.04 10*3/uL (ref 0.00–0.07)
Basophils Absolute: 0 10*3/uL (ref 0.0–0.1)
Basophils Relative: 0 %
Eosinophils Absolute: 0.1 10*3/uL (ref 0.0–0.5)
Eosinophils Relative: 1 %
HCT: 32.6 % — ABNORMAL LOW (ref 39.0–52.0)
Hemoglobin: 10.9 g/dL — ABNORMAL LOW (ref 13.0–17.0)
Immature Granulocytes: 1 %
Lymphocytes Relative: 27 %
Lymphs Abs: 1.5 10*3/uL (ref 0.7–4.0)
MCH: 32.7 pg (ref 26.0–34.0)
MCHC: 33.4 g/dL (ref 30.0–36.0)
MCV: 97.9 fL (ref 80.0–100.0)
Monocytes Absolute: 0.5 10*3/uL (ref 0.1–1.0)
Monocytes Relative: 9 %
Neutro Abs: 3.4 10*3/uL (ref 1.7–7.7)
Neutrophils Relative %: 62 %
Platelet Count: 165 10*3/uL (ref 150–400)
RBC: 3.33 MIL/uL — ABNORMAL LOW (ref 4.22–5.81)
RDW: 13.9 % (ref 11.5–15.5)
WBC Count: 5.6 10*3/uL (ref 4.0–10.5)
nRBC: 0 % (ref 0.0–0.2)

## 2019-02-08 LAB — CMP (CANCER CENTER ONLY)
ALT: 12 U/L (ref 0–44)
AST: 16 U/L (ref 15–41)
Albumin: 3.8 g/dL (ref 3.5–5.0)
Alkaline Phosphatase: 52 U/L (ref 38–126)
Anion gap: 7 (ref 5–15)
BUN: 21 mg/dL (ref 8–23)
CO2: 28 mmol/L (ref 22–32)
Calcium: 9.4 mg/dL (ref 8.9–10.3)
Chloride: 102 mmol/L (ref 98–111)
Creatinine: 1.12 mg/dL (ref 0.61–1.24)
GFR, Est AFR Am: >60 mL/min (ref >60)
GFR, Estimated: >60 mL/min (ref >60)
Glucose, Bld: 124 mg/dL — ABNORMAL HIGH (ref 70–99)
Potassium: 3.4 mmol/L — ABNORMAL LOW (ref 3.5–5.1)
Sodium: 137 mmol/L (ref 135–145)
Total Bilirubin: 0.8 mg/dL (ref 0.3–1.2)
Total Protein: 5.9 g/dL — ABNORMAL LOW (ref 6.5–8.1)

## 2019-02-08 LAB — FERRITIN: Ferritin: 1614 ng/mL — ABNORMAL HIGH (ref 24–336)

## 2019-02-08 LAB — IRON AND TIBC
Iron: 102 ug/dL (ref 42–163)
Saturation Ratios: 42 % (ref 20–55)
TIBC: 246 ug/dL (ref 202–409)
UIBC: 144 ug/dL (ref 117–376)

## 2019-02-08 MED ORDER — SODIUM CHLORIDE 0.9 % IV SOLN
1500.0000 mg | Freq: Once | INTRAVENOUS | Status: AC
  Administered 2019-02-08: 1500 mg via INTRAVENOUS
  Filled 2019-02-08: qty 60

## 2019-02-08 MED ORDER — ACETAMINOPHEN 325 MG PO TABS
ORAL_TABLET | ORAL | Status: AC
  Filled 2019-02-08: qty 2

## 2019-02-08 MED ORDER — METHYLPREDNISOLONE SODIUM SUCC 125 MG IJ SOLR
INTRAMUSCULAR | Status: AC
  Filled 2019-02-08: qty 2

## 2019-02-08 MED ORDER — HEPARIN SOD (PORK) LOCK FLUSH 100 UNIT/ML IV SOLN
500.0000 [IU] | Freq: Once | INTRAVENOUS | Status: AC | PRN
  Administered 2019-02-08: 500 [IU]
  Filled 2019-02-08: qty 5

## 2019-02-08 MED ORDER — SODIUM CHLORIDE 0.9% FLUSH
10.0000 mL | INTRAVENOUS | Status: DC | PRN
  Administered 2019-02-08: 12:00:00 10 mL
  Filled 2019-02-08: qty 10

## 2019-02-08 MED ORDER — DIPHENHYDRAMINE HCL 25 MG PO CAPS
50.0000 mg | ORAL_CAPSULE | Freq: Once | ORAL | Status: AC
  Administered 2019-02-08: 50 mg via ORAL

## 2019-02-08 MED ORDER — SODIUM CHLORIDE 0.9% FLUSH
10.0000 mL | Freq: Once | INTRAVENOUS | Status: AC
  Administered 2019-02-08: 10 mL
  Filled 2019-02-08: qty 10

## 2019-02-08 MED ORDER — DIPHENHYDRAMINE HCL 25 MG PO CAPS
ORAL_CAPSULE | ORAL | Status: AC
  Filled 2019-02-08: qty 2

## 2019-02-08 MED ORDER — SODIUM CHLORIDE 0.9 % IV SOLN
Freq: Once | INTRAVENOUS | Status: AC
  Administered 2019-02-08: 09:00:00 via INTRAVENOUS
  Filled 2019-02-08: qty 250

## 2019-02-08 MED ORDER — ACETAMINOPHEN 325 MG PO TABS
650.0000 mg | ORAL_TABLET | Freq: Once | ORAL | Status: AC
  Administered 2019-02-08: 650 mg via ORAL

## 2019-02-08 MED ORDER — METHYLPREDNISOLONE SODIUM SUCC 125 MG IJ SOLR
100.0000 mg | Freq: Once | INTRAMUSCULAR | Status: AC
  Administered 2019-02-08: 100 mg via INTRAVENOUS

## 2019-02-08 NOTE — Progress Notes (Signed)
Hematology and Oncology Follow Up Visit  HOSKIE DELOE [id number] 1934/03/10 85 y.o. 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 3 weeks for hemoglobin less than 10   Interim History:  Mr. Mcnelly is here today for follow-up.  So far, he is tolerated the daratumumab pretty well.  He really has had no problems with this.  I am very impressed with his blood pressure.  He is working very closely with his family doctor to help with his blood pressure control.  The myeloma is responding quite nicely to treatment.  His last monoclonal spike back in August was 0.2 g/dL.  His IgG level was 800 mg/dL.  His kappa light chain was 0.9 mg/dL.  He had a very nice Labor Day weekend.  He grilled out.  He and his wife are gone back to his hometown of [place], [place].  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  He has had no problems with headache.  He has had no cough.  He has had no problems with bowels or bladder.  Currently, I would say performance status is ECOG 1.   Medications:  Allergies as of 02/08/2019   No Known Allergies     Medication List       Accurate as of February 08, 2019  8:01 AM. If you have any questions, ask your nurse or doctor.        aspirin 81 MG tablet Take 81 mg by mouth daily.   bimatoprost 0.01 % Soln Commonly known as: LUMIGAN Place 1 drop into both eyes at bedtime.   CALTRATE 600+D PLUS PO Take 1 tablet by mouth daily.   carvedilol 12.5 MG tablet Commonly known as: COREG Take 1 tablet (12.5 mg total) by mouth 2 (two) times daily with a  meal.   cloNIDine 0.1 MG tablet Commonly known as: CATAPRES Take 1 tablet (0.1 mg total) by mouth daily. Take an extra tablet as needed for blood pressure greater than 170/100 (x1 per day)   [employer] VITAMIN B12 2000 MCG tablet Generic drug: cyanocobalamin Take 2,000 mcg by mouth at bedtime.   famciclovir 250 MG tablet Commonly known as: FAMVIR Take 1 tablet (250 mg total) by mouth daily.   furosemide 20 MG tablet Commonly known as: LASIX Take 2 tablets (40 mg total) by mouth daily.   HCA LAX-X PO Take by mouth every three (3) days as needed.   hydrochlorothiazide 25 MG tablet Commonly known as: HYDRODIURIL 25 mg daily.   losartan 100 MG tablet Commonly known as: COZAAR Take 100 mg by mouth daily.   metolazone 5 MG tablet Commonly known as: ZAROXOLYN Take 1 pill 1 hour prior to taking Lasix daily   multivitamin with minerals Tabs tablet Take 1 tablet by mouth daily.   potassium chloride SA 20 MEQ tablet Commonly known as: K-DUR Take 20 mEq by mouth daily.   pravastatin 20 MG tablet Commonly known as: PRAVACHOL Take 20 mg by mouth at bedtime.   SYSTANE OP Place 1 drop into both eyes daily as needed (dry eyes).  traMADol 50 MG tablet Commonly known as: ULTRAM Take 1 tablet (50 mg total) by mouth every 6 (six) hours as needed.   Vitamin D3 25 MCG (1000 UT) Caps Take 1,000 Units by mouth daily.   Zometa 4 MG/100ML IVPB Generic drug: Zoledronic Acid Inject 4 mg into the vein every 4 (four) months. Administered by Dr. Jonette Eva at [facility] - last infusion mid July 2019       Allergies: No Known Allergies  Past Medical History, Surgical history, Social history, and Family History were reviewed and updated.  Review of Systems: Review of Systems  Constitutional: Negative.   HENT: Negative.   Eyes: Negative.   Respiratory: Negative.   Cardiovascular: Positive for leg swelling.  Gastrointestinal: Positive for heartburn.  Genitourinary: Negative.    Musculoskeletal: Positive for joint pain.  Skin: Negative.   Neurological: Negative.   Endo/Heme/Allergies: Negative.   Psychiatric/Behavioral: Negative.      Physical Exam:  vitals were not taken for this visit.   Wt Readings from Last 3 Encounters:  02/07/19 203 lb 12.8 oz (92.4 kg)  01/25/19 200 lb 1.3 oz (90.8 kg)  01/11/19 201 lb (91.2 kg)    Physical Exam Vitals signs reviewed.  HENT:     Head: Normocephalic and atraumatic.  Eyes:     Pupils: Pupils are equal, round, and reactive to light.  Neck:     Musculoskeletal: Normal range of motion.  Cardiovascular:     Rate and Rhythm: Normal rate and regular rhythm.     Heart sounds: Normal heart sounds.  Pulmonary:     Effort: Pulmonary effort is normal.     Breath sounds: Normal breath sounds.  Abdominal:     General: Bowel sounds are normal.     Palpations: Abdomen is soft.     Comments: Abdominal exam is obese but soft.  He has good bowel sounds.  There is no fluid wave.  There is no palpable liver or spleen tip.  Musculoskeletal: Normal range of motion.        General: No tenderness or deformity.     Comments: Extremities shows 1+ edema in his lower legs and feet.  This is mildly pitting.  He has decent range of motion of his joints.  He has decent strength.  Lymphadenopathy:     Cervical: No cervical adenopathy.  Skin:    General: Skin is warm and dry.     Findings: No erythema or rash.  Neurological:     Mental Status: He is alert and oriented to person, place, and time.  Psychiatric:        Behavior: Behavior normal.        Thought Content: Thought content normal.        Judgment: Judgment normal.      Lab Results  Component Value Date   WBC 5.5 01/25/2019   HGB 11.2 (L) 01/25/2019   HCT 32.9 (L) 01/25/2019   MCV 96.8 01/25/2019   PLT 193 01/25/2019   Lab Results  Component Value Date   FERRITIN 1,934 (H) 01/11/2019   IRON 151 01/11/2019   TIBC 235 01/11/2019   UIBC 84 (L) 01/11/2019    IRONPCTSAT 64 (H) 01/11/2019   Lab Results  Component Value Date   RETICCTPCT 0.9 01/11/2019   RBC 3.40 (L) 01/25/2019   Lab Results  Component Value Date   KPAFRELGTCHN 9.1 01/11/2019   LAMBDASER 5.8 01/11/2019   KAPLAMBRATIO 1.57 01/11/2019   Lab Results  Component Value Date  IGGSERUM 795 01/11/2019   IGA 16 (L) 01/11/2019   IGMSERUM 33 01/11/2019   Lab Results  Component Value Date   TOTALPROTELP 5.6 (L) 01/11/2019   ALBUMINELP 3.4 01/11/2019   A1GS 0.2 01/11/2019   A2GS 0.8 01/11/2019   BETS 0.7 01/11/2019   BETA2SER 0.3 01/03/2015   GAMS 0.6 01/11/2019   MSPIKE 0.2 (H) 01/11/2019   SPEI Comment 01/11/2019     Chemistry      Component Value Date/Time   NA 137 01/25/2019 0834   NA 137 04/08/2017 1141   NA 138 10/29/2015 1029   K 3.3 (L) 01/25/2019 0834   K 3.4 04/08/2017 1141   K 3.9 10/29/2015 1029   CL 99 01/25/2019 0834   CL 102 04/08/2017 1141   CO2 30 01/25/2019 0834   CO2 30 04/08/2017 1141   CO2 27 10/29/2015 1029   BUN 20 01/25/2019 0834   BUN 16 04/08/2017 1141   BUN 13.8 10/29/2015 1029   CREATININE 1.10 01/25/2019 0834   CREATININE 1.3 (H) 04/08/2017 1141   CREATININE 1.1 10/29/2015 1029      Component Value Date/Time   CALCIUM 9.7 01/25/2019 0834   CALCIUM 9.3 04/08/2017 1141   CALCIUM 9.3 10/29/2015 1029   ALKPHOS 49 01/25/2019 0834   ALKPHOS 66 04/08/2017 1141   ALKPHOS 52 10/29/2015 1029   AST 15 01/25/2019 0834   AST 20 10/29/2015 1029   ALT 12 01/25/2019 0834   ALT 21 04/08/2017 1141   ALT 14 10/29/2015 1029   BILITOT 0.7 01/25/2019 0834   BILITOT 0.75 10/29/2015 1029       Impression and Plan: Mr. Kiebler is a very pleasant 85 yo African American gentleman with relapsedIgG kappamyeloma.He has a history of stem cell transplant back in 2006.  It is apparent that the daratumumab is helping quite a bit.  Again I am just happy that his myeloma is responding that his quality of life is doing better.    He is not nearly have as  much swelling in his legs.  We will plan to get him back in another month.     Volanda Napoleon, MD 9/16/20208:01 AM

## 2019-02-08 NOTE — Patient Instructions (Signed)

## 2019-02-08 NOTE — Patient Instructions (Signed)
[facility] Discharge Instructions for Patients Receiving Chemotherapy  Today you received the following chemotherapy agents:  Darzalex  To help prevent nausea and vomiting after your treatment, we encourage you to take your nausea medication as prescribed.   If you develop nausea and vomiting that is not controlled by your nausea medication, call the clinic.   BELOW ARE SYMPTOMS THAT SHOULD BE REPORTED IMMEDIATELY:  *FEVER GREATER THAN 100.5 F  *CHILLS WITH OR WITHOUT FEVER  NAUSEA AND VOMITING THAT IS NOT CONTROLLED WITH YOUR NAUSEA MEDICATION  *UNUSUAL SHORTNESS OF BREATH  *UNUSUAL BRUISING OR BLEEDING  TENDERNESS IN MOUTH AND THROAT WITH OR WITHOUT PRESENCE OF ULCERS  *URINARY PROBLEMS  *BOWEL PROBLEMS  UNUSUAL RASH Items with * indicate a potential emergency and should be followed up as soon as possible.  Feel free to call the clinic should you have any questions or concerns. The clinic phone number is [phone].  Please show the CHEMO ALERT CARD at check-in to the Emergency Department and triage nurse.   

## 2019-02-09 LAB — IGG, IGA, IGM
IgA: 15 mg/dL — ABNORMAL LOW (ref 61–437)
IgG (Immunoglobin G), Serum: 694 mg/dL (ref 603–1613)
IgM (Immunoglobulin M), Srm: 33 mg/dL (ref 15–143)

## 2019-02-09 LAB — KAPPA/LAMBDA LIGHT CHAINS
Kappa free light chain: 11.2 mg/L (ref 3.3–19.4)
Kappa, lambda light chain ratio: 1.37 (ref 0.26–1.65)
Lambda free light chains: 8.2 mg/L (ref 5.7–26.3)

## 2019-02-10 LAB — IMMUNOFIXATION REFLEX, SERUM
IgA: 17 mg/dL — ABNORMAL LOW (ref 61–437)
IgG (Immunoglobin G), Serum: 696 mg/dL (ref 603–1613)
IgM (Immunoglobulin M), Srm: 34 mg/dL (ref 15–143)

## 2019-02-10 LAB — PROTEIN ELECTROPHORESIS, SERUM, WITH REFLEX
A/G Ratio: 1.5 (ref 0.7–1.7)
Albumin ELP: 3.5 g/dL (ref 2.9–4.4)
Alpha-1-Globulin: 0.2 g/dL (ref 0.0–0.4)
Alpha-2-Globulin: 0.8 g/dL (ref 0.4–1.0)
Beta Globulin: 0.8 g/dL (ref 0.7–1.3)
Gamma Globulin: 0.6 g/dL (ref 0.4–1.8)
Globulin, Total: 2.4 g/dL (ref 2.2–3.9)
M-Spike, %: 0.2 g/dL — ABNORMAL HIGH
SPEP Interpretation: 0
Total Protein ELP: 5.9 g/dL — ABNORMAL LOW (ref 6.0–8.5)

## 2019-02-20 DIAGNOSIS — E7849 Other hyperlipidemia: Secondary | ICD-10-CM | POA: Diagnosis not present

## 2019-02-21 ENCOUNTER — Other Ambulatory Visit: Payer: Self-pay | Admitting: *Deleted

## 2019-02-21 DIAGNOSIS — R82998 Other abnormal findings in urine: Secondary | ICD-10-CM | POA: Diagnosis not present

## 2019-02-21 DIAGNOSIS — C9002 Multiple myeloma in relapse: Secondary | ICD-10-CM

## 2019-02-22 ENCOUNTER — Inpatient Hospital Stay: Payer: Medicare Other

## 2019-02-22 ENCOUNTER — Other Ambulatory Visit: Payer: Self-pay

## 2019-02-22 VITALS — BP 176/80 | HR 50 | Temp 98.0°F | Resp 17

## 2019-02-22 DIAGNOSIS — C9002 Multiple myeloma in relapse: Secondary | ICD-10-CM | POA: Diagnosis not present

## 2019-02-22 DIAGNOSIS — Z5112 Encounter for antineoplastic immunotherapy: Secondary | ICD-10-CM | POA: Diagnosis not present

## 2019-02-22 DIAGNOSIS — Z79899 Other long term (current) drug therapy: Secondary | ICD-10-CM | POA: Diagnosis not present

## 2019-02-22 DIAGNOSIS — Z1212 Encounter for screening for malignant neoplasm of rectum: Secondary | ICD-10-CM | POA: Diagnosis not present

## 2019-02-22 DIAGNOSIS — D630 Anemia in neoplastic disease: Secondary | ICD-10-CM | POA: Diagnosis not present

## 2019-02-22 LAB — CBC WITH DIFFERENTIAL (CANCER CENTER ONLY)
Abs Immature Granulocytes: 0.02 10*3/uL (ref 0.00–0.07)
Basophils Absolute: 0 10*3/uL (ref 0.0–0.1)
Basophils Relative: 0 %
Eosinophils Absolute: 0.1 10*3/uL (ref 0.0–0.5)
Eosinophils Relative: 1 %
HCT: 32.1 % — ABNORMAL LOW (ref 39.0–52.0)
Hemoglobin: 10.9 g/dL — ABNORMAL LOW (ref 13.0–17.0)
Immature Granulocytes: 0 %
Lymphocytes Relative: 21 %
Lymphs Abs: 1.5 10*3/uL (ref 0.7–4.0)
MCH: 33.4 pg (ref 26.0–34.0)
MCHC: 34 g/dL (ref 30.0–36.0)
MCV: 98.5 fL (ref 80.0–100.0)
Monocytes Absolute: 0.6 10*3/uL (ref 0.1–1.0)
Monocytes Relative: 8 %
Neutro Abs: 5 10*3/uL (ref 1.7–7.7)
Neutrophils Relative %: 70 %
Platelet Count: 183 10*3/uL (ref 150–400)
RBC: 3.26 MIL/uL — ABNORMAL LOW (ref 4.22–5.81)
RDW: 13.8 % (ref 11.5–15.5)
WBC Count: 7.1 10*3/uL (ref 4.0–10.5)
nRBC: 0 % (ref 0.0–0.2)

## 2019-02-22 LAB — CMP (CANCER CENTER ONLY)
ALT: 14 U/L (ref 0–44)
AST: 15 U/L (ref 15–41)
Albumin: 4.1 g/dL (ref 3.5–5.0)
Alkaline Phosphatase: 53 U/L (ref 38–126)
Anion gap: 9 (ref 5–15)
BUN: 26 mg/dL — ABNORMAL HIGH (ref 8–23)
CO2: 30 mmol/L (ref 22–32)
Calcium: 10 mg/dL (ref 8.9–10.3)
Chloride: 97 mmol/L — ABNORMAL LOW (ref 98–111)
Creatinine: 1.23 mg/dL (ref 0.61–1.24)
GFR, Est AFR Am: >60 mL/min (ref >60)
GFR, Estimated: 54 mL/min — ABNORMAL LOW (ref >60)
Glucose, Bld: 121 mg/dL — ABNORMAL HIGH (ref 70–99)
Potassium: 3.4 mmol/L — ABNORMAL LOW (ref 3.5–5.1)
Sodium: 136 mmol/L (ref 135–145)
Total Bilirubin: 1 mg/dL (ref 0.3–1.2)
Total Protein: 6.4 g/dL — ABNORMAL LOW (ref 6.5–8.1)

## 2019-02-22 MED ORDER — SODIUM CHLORIDE 0.9% FLUSH
10.0000 mL | INTRAVENOUS | Status: DC | PRN
  Administered 2019-02-22: 10 mL
  Filled 2019-02-22: qty 10

## 2019-02-22 MED ORDER — ACETAMINOPHEN 325 MG PO TABS
ORAL_TABLET | ORAL | Status: AC
  Filled 2019-02-22: qty 2

## 2019-02-22 MED ORDER — SODIUM CHLORIDE 0.9 % IV SOLN
Freq: Once | INTRAVENOUS | Status: AC
  Administered 2019-02-22: 09:00:00 via INTRAVENOUS
  Filled 2019-02-22: qty 250

## 2019-02-22 MED ORDER — METHYLPREDNISOLONE SODIUM SUCC 125 MG IJ SOLR
INTRAMUSCULAR | Status: AC
  Filled 2019-02-22: qty 2

## 2019-02-22 MED ORDER — METHYLPREDNISOLONE SODIUM SUCC 125 MG IJ SOLR
100.0000 mg | Freq: Once | INTRAMUSCULAR | Status: AC
  Administered 2019-02-22: 100 mg via INTRAVENOUS

## 2019-02-22 MED ORDER — DIPHENHYDRAMINE HCL 25 MG PO CAPS
50.0000 mg | ORAL_CAPSULE | Freq: Once | ORAL | Status: AC
  Administered 2019-02-22: 50 mg via ORAL

## 2019-02-22 MED ORDER — SODIUM CHLORIDE 0.9 % IV SOLN
16.3000 mg/kg | Freq: Once | INTRAVENOUS | Status: AC
  Administered 2019-02-22: 1500 mg via INTRAVENOUS
  Filled 2019-02-22: qty 60

## 2019-02-22 MED ORDER — HEPARIN SOD (PORK) LOCK FLUSH 100 UNIT/ML IV SOLN
500.0000 [IU] | Freq: Once | INTRAVENOUS | Status: AC | PRN
  Administered 2019-02-22: 500 [IU]
  Filled 2019-02-22: qty 5

## 2019-02-22 MED ORDER — ACETAMINOPHEN 325 MG PO TABS
650.0000 mg | ORAL_TABLET | Freq: Once | ORAL | Status: AC
  Administered 2019-02-22: 650 mg via ORAL

## 2019-02-22 MED ORDER — DIPHENHYDRAMINE HCL 25 MG PO CAPS
ORAL_CAPSULE | ORAL | Status: AC
  Filled 2019-02-22: qty 2

## 2019-02-22 NOTE — Patient Instructions (Addendum)
[facility] Discharge Instructions for Patients Receiving Chemotherapy  Today you received the following chemotherapy agents Darzalex  To help prevent nausea and vomiting after your treatment, we encourage you to take your nausea medication as prescribed.   If you develop nausea and vomiting that is not controlled by your nausea medication, call the clinic.   BELOW ARE SYMPTOMS THAT SHOULD BE REPORTED IMMEDIATELY:  *FEVER GREATER THAN 100.5 F  *CHILLS WITH OR WITHOUT FEVER  NAUSEA AND VOMITING THAT IS NOT CONTROLLED WITH YOUR NAUSEA MEDICATION  *UNUSUAL SHORTNESS OF BREATH  *UNUSUAL BRUISING OR BLEEDING  TENDERNESS IN MOUTH AND THROAT WITH OR WITHOUT PRESENCE OF ULCERS  *URINARY PROBLEMS  *BOWEL PROBLEMS  UNUSUAL RASH Items with * indicate a potential emergency and should be followed up as soon as possible.  Feel free to call the clinic should you have any questions or concerns. The clinic phone number is [phone].  Please show the [place] at check-in to the Emergency Department and triage nurse.  Daratumumab injection What is this medicine? DARATUMUMAB (dar a toom ue mab) is a monoclonal antibody. It is used to treat multiple myeloma. This medicine may be used for other purposes; ask your health care provider or pharmacist if you have questions. COMMON BRAND NAME(S): DARZALEX What should I tell my health care provider before I take this medicine? They need to know if you have any of these conditions:  infection (especially a virus infection such as chickenpox, herpes, or hepatitis B virus)  lung or breathing disease  an unusual or allergic reaction to daratumumab, other medicines, foods, dyes, or preservatives  pregnant or trying to get pregnant  breast-feeding How should I use this medicine? This medicine is for infusion into a vein. It is given by a health care professional in a hospital or clinic setting. Talk to your pediatrician  regarding the use of this medicine in children. Special care may be needed. Overdosage: If you think you have taken too much of this medicine contact a poison control center or emergency room at once. NOTE: This medicine is only for you. Do not share this medicine with others. What if I miss a dose? Keep appointments for follow-up doses as directed. It is important not to miss your dose. Call your doctor or health care professional if you are unable to keep an appointment. What may interact with this medicine? Interactions have not been studied. This list may not describe all possible interactions. Give your health care provider a list of all the medicines, herbs, non-prescription drugs, or dietary supplements you use. Also tell them if you smoke, drink alcohol, or use illegal drugs. Some items may interact with your medicine. What should I watch for while using this medicine? This drug may make you feel generally unwell. Report any side effects. Continue your course of treatment even though you feel ill unless your doctor tells you to stop. This medicine can cause serious allergic reactions. To reduce your risk you may need to take medicine before treatment with this medicine. Take your medicine as directed. This medicine can affect the results of blood tests to match your blood type. These changes can last for up to 6 months after the final dose. Your healthcare provider will do blood tests to match your blood type before you start treatment. Tell all of your healthcare providers that you are being treated with this medicine before receiving a blood transfusion. This medicine can affect the results of some tests  used to determine treatment response; extra tests may be needed to evaluate response. Do not become pregnant while taking this medicine or for 3 months after stopping it. Women should inform their doctor if they wish to become pregnant or think they might be pregnant. There is a potential for  serious side effects to an unborn child. Talk to your health care professional or pharmacist for more information. What side effects may I notice from receiving this medicine? Side effects that you should report to your doctor or health care professional as soon as possible:  allergic reactions like skin rash, itching or hives, swelling of the face, lips, or tongue  breathing problems  chills  cough  dizziness  feeling faint or lightheaded  headache  low blood counts - this medicine may decrease the number of white blood cells, red blood cells and platelets. You may be at increased risk for infections and bleeding.  nausea, vomiting  shortness of breath  signs of decreased platelets or bleeding - bruising, pinpoint red spots on the skin, black, tarry stools, blood in the urine  signs of decreased red blood cells - unusually weak or tired, feeling faint or lightheaded, falls  signs of infection - fever or chills, cough, sore throat, pain or difficulty passing urine  signs and symptoms of liver injury like dark yellow or brown urine; general ill feeling or flu-like symptoms; light-colored stools; loss of appetite; right upper belly pain; unusually weak or tired; yellowing of the eyes or skin Side effects that usually do not require medical attention (report to your doctor or health care professional if they continue or are bothersome):  back pain  constipation  loss of appetite  diarrhea  joint pain  muscle cramps  pain, tingling, numbness in the hands or feet  swelling of the ankles, feet, hands  tiredness  trouble sleeping This list may not describe all possible side effects. Call your doctor for medical advice about side effects. You may report side effects to FDA at 1-800-FDA-1088. Where should I keep my medicine? Keep out of the reach of children. This drug is given in a hospital or clinic and will not be stored at home. NOTE: This sheet is a summary. It may  not cover all possible information. If you have questions about this medicine, talk to your doctor, pharmacist, or health care provider.  2020 Elsevier/Gold Standard (2018-02-24 14:00:48)

## 2019-02-22 NOTE — Patient Instructions (Signed)
Tunneled Central Venous Catheter Flushing Guide  It is important to flush your tunneled central venous catheter each time you use it, both before and after you use it. Flushing your catheter will help prevent it from clogging. What are the risks? Risks may include:  Infection.  Air getting into the catheter and bloodstream. Supplies needed:  A clean pair of gloves.  A disinfecting wipe. Use an alcohol wipe, chlorhexidine wipe, or iodine wipe as told by your health care provider.  A 10 mL syringe that has been prefilled with saline solution.  An empty 10 mL syringe, if a substance called heparin was injected into your catheter. How to flush your catheter When you flush your catheter, make sure you follow any specific instructions from your health care provider or the manufacturer. These are general guidelines. Flushing your catheter before use If there is heparin in your catheter: 1. Wash your hands with soap and water. 2. Put on gloves. 3. Scrub the injection cap for a minimum of 15 seconds with a disinfecting wipe. 4. Unclamp the catheter. 5. Attach the empty syringe to the injection cap. 6. Pull the syringe plunger back and withdraw 10 mL of blood. 7. Place the syringe into an appropriate waste container. 8. Scrub the injection cap for 15 seconds with a disinfecting wipe. 9. Attach the prefilled syringe to the injection cap. 10. Flush the catheter by pushing the plunger forward until all the liquid from the syringe is in the catheter. 11. Remove the syringe from the injection cap. 12. Clamp the catheter. If there is no heparin in your catheter: 1. Wash your hands with soap and water. 2. Put on gloves. 3. Scrub the injection cap for 15 seconds with a disinfecting wipe. 4. Unclamp the catheter. 5. Attach the prefilled syringe to the injection cap. 6. Flush the catheter by pushing the plunger forward until 5 mL of the liquid from the syringe is in the catheter. 7. Pull back on  the syringe until you see blood in the catheter. 8. If you have been asked to collect any blood, follow your health care provider's instructions. Otherwise, flush the catheter with the rest of the solution from the syringe. 9. Remove the syringe from the injection cap. 10. Clamp the catheter.  Flushing your catheter after use 1. Wash your hands with soap and water. 2. Put on gloves. 3. Scrub the injection cap for 15 seconds with a disinfecting wipe. 4. Unclamp the catheter. 5. Attach the prefilled syringe to the injection cap. 6. Flush the catheter by pushing the plunger forward until all of the liquid from the syringe is in the catheter. 7. Remove the syringe from the injection cap. 8. Clamp the catheter. Problems and solutions  If blood cannot be completely cleared from the injection cap, you may need to have the injection cap replaced.  If the catheter is difficult to flush, use the pulsing method. The pulsing method involves pushing only a few milliliters of solution into the catheter at a time and pausing between pushes.  If you do not see blood in the catheter when you pull back on the syringe, change your body position, such as by raising your arms above your head. Take a deep breath and cough. Then, pull back on the syringe. If you still do not see blood, flush the catheter with a small amount of solution. Then, change positions again and take a breath or cough. Pull back on the syringe again. If you still do not see   blood, finish flushing the catheter and contact your health care provider. Do not use your catheter until your health care provider says it is okay. General tips  Have someone help you flush your catheter, if possible.  Do not force fluid through your catheter.  Do not use a syringe that is larger or smaller than 10 mL. Using a smaller syringe can make the catheter burst.  Do not use your catheter without flushing it first if it has heparin in it. Contact a health  care provider if:  You cannot see any blood in the catheter when you flush it before using it.  Your catheter is difficult to flush. Get help right away if:  You cannot flush the catheter.  The catheter leaks when you flush it or when there is fluid in it.  There are cracks or breaks in the catheter. Summary  It is important to flush your tunneled central venous catheter each time you use it, both before and after you use it.  Scrub the injection cap for 15 seconds with a disinfecting wipe before and after you flush it.  When you flush your catheter, make sure you follow any specific instructions from your health care provider or the manufacturer.  Get help right away if you cannot flush the catheter. This information is not intended to replace advice given to you by your health care provider. Make sure you discuss any questions you have with your health care provider. Document Released: 04/30/2011 Document Revised: 07/27/2018 Document Reviewed: 07/27/2018 Elsevier Patient Education  2020 [employer]vier Inc.  

## 2019-02-27 DIAGNOSIS — Z1339 Encounter for screening examination for other mental health and behavioral disorders: Secondary | ICD-10-CM | POA: Diagnosis not present

## 2019-02-27 DIAGNOSIS — Z0000 Encounter for general adult medical examination without abnormal findings: Secondary | ICD-10-CM | POA: Diagnosis not present

## 2019-02-27 DIAGNOSIS — I10 Essential (primary) hypertension: Secondary | ICD-10-CM | POA: Diagnosis not present

## 2019-02-27 DIAGNOSIS — E785 Hyperlipidemia, unspecified: Secondary | ICD-10-CM | POA: Diagnosis not present

## 2019-02-27 DIAGNOSIS — C9000 Multiple myeloma not having achieved remission: Secondary | ICD-10-CM | POA: Diagnosis not present

## 2019-02-27 DIAGNOSIS — M25562 Pain in left knee: Secondary | ICD-10-CM | POA: Diagnosis not present

## 2019-02-27 DIAGNOSIS — R195 Other fecal abnormalities: Secondary | ICD-10-CM | POA: Diagnosis not present

## 2019-03-06 ENCOUNTER — Other Ambulatory Visit: Payer: Self-pay | Admitting: Hematology & Oncology

## 2019-03-08 ENCOUNTER — Inpatient Hospital Stay: Payer: Medicare Other

## 2019-03-08 ENCOUNTER — Other Ambulatory Visit: Payer: Self-pay

## 2019-03-08 ENCOUNTER — Inpatient Hospital Stay (HOSPITAL_BASED_OUTPATIENT_CLINIC_OR_DEPARTMENT_OTHER): Payer: Medicare Other | Admitting: Hematology & Oncology

## 2019-03-08 ENCOUNTER — Inpatient Hospital Stay: Payer: Medicare Other | Attending: Hematology & Oncology

## 2019-03-08 ENCOUNTER — Encounter: Payer: Self-pay | Admitting: Hematology & Oncology

## 2019-03-08 VITALS — BP 161/70 | HR 60 | Resp 17

## 2019-03-08 VITALS — BP 166/85 | HR 59 | Temp 97.1°F | Resp 18 | Wt 211.0 lb

## 2019-03-08 DIAGNOSIS — Z5112 Encounter for antineoplastic immunotherapy: Secondary | ICD-10-CM | POA: Diagnosis not present

## 2019-03-08 DIAGNOSIS — D469 Myelodysplastic syndrome, unspecified: Secondary | ICD-10-CM | POA: Diagnosis not present

## 2019-03-08 DIAGNOSIS — C9002 Multiple myeloma in relapse: Secondary | ICD-10-CM | POA: Insufficient documentation

## 2019-03-08 DIAGNOSIS — Z79899 Other long term (current) drug therapy: Secondary | ICD-10-CM | POA: Diagnosis not present

## 2019-03-08 DIAGNOSIS — D630 Anemia in neoplastic disease: Secondary | ICD-10-CM | POA: Diagnosis not present

## 2019-03-08 LAB — CBC WITH DIFFERENTIAL (CANCER CENTER ONLY)
Abs Immature Granulocytes: 0.02 10*3/uL (ref 0.00–0.07)
Basophils Absolute: 0 10*3/uL (ref 0.0–0.1)
Basophils Relative: 0 %
Eosinophils Absolute: 0.1 10*3/uL (ref 0.0–0.5)
Eosinophils Relative: 1 %
HCT: 29.9 % — ABNORMAL LOW (ref 39.0–52.0)
Hemoglobin: 10.1 g/dL — ABNORMAL LOW (ref 13.0–17.0)
Immature Granulocytes: 0 %
Lymphocytes Relative: 27 %
Lymphs Abs: 1.5 10*3/uL (ref 0.7–4.0)
MCH: 33.9 pg (ref 26.0–34.0)
MCHC: 33.8 g/dL (ref 30.0–36.0)
MCV: 100.3 fL — ABNORMAL HIGH (ref 80.0–100.0)
Monocytes Absolute: 0.6 10*3/uL (ref 0.1–1.0)
Monocytes Relative: 11 %
Neutro Abs: 3.2 10*3/uL (ref 1.7–7.7)
Neutrophils Relative %: 61 %
Platelet Count: 147 10*3/uL — ABNORMAL LOW (ref 150–400)
RBC: 2.98 MIL/uL — ABNORMAL LOW (ref 4.22–5.81)
RDW: 13.6 % (ref 11.5–15.5)
WBC Count: 5.3 10*3/uL (ref 4.0–10.5)
nRBC: 0 % (ref 0.0–0.2)

## 2019-03-08 LAB — CMP (CANCER CENTER ONLY)
ALT: 13 U/L (ref 0–44)
AST: 15 U/L (ref 15–41)
Albumin: 3.8 g/dL (ref 3.5–5.0)
Alkaline Phosphatase: 44 U/L (ref 38–126)
Anion gap: 6 (ref 5–15)
BUN: 18 mg/dL (ref 8–23)
CO2: 27 mmol/L (ref 22–32)
Calcium: 9.5 mg/dL (ref 8.9–10.3)
Chloride: 105 mmol/L (ref 98–111)
Creatinine: 1.04 mg/dL (ref 0.61–1.24)
GFR, Est AFR Am: >60 mL/min (ref >60)
GFR, Estimated: >60 mL/min (ref >60)
Glucose, Bld: 115 mg/dL — ABNORMAL HIGH (ref 70–99)
Potassium: 3.6 mmol/L (ref 3.5–5.1)
Sodium: 138 mmol/L (ref 135–145)
Total Bilirubin: 0.9 mg/dL (ref 0.3–1.2)
Total Protein: 5.7 g/dL — ABNORMAL LOW (ref 6.5–8.1)

## 2019-03-08 MED ORDER — DARATUMUMAB-HYALURONIDASE-FIHJ 1800-30000 MG-UT/15ML ~~LOC~~ SOLN
1800.0000 mg | Freq: Once | SUBCUTANEOUS | Status: AC
  Administered 2019-03-08: 1800 mg via SUBCUTANEOUS
  Filled 2019-03-08: qty 15

## 2019-03-08 MED ORDER — METHYLPREDNISOLONE SODIUM SUCC 125 MG IJ SOLR: 100.0000 mg | Freq: Once | INTRAMUSCULAR | Status: DC

## 2019-03-08 MED ORDER — SODIUM CHLORIDE 0.9 % IV SOLN
Freq: Once | INTRAVENOUS | Status: AC
  Administered 2019-03-08: 09:00:00 via INTRAVENOUS
  Filled 2019-03-08: qty 250

## 2019-03-08 MED ORDER — SODIUM CHLORIDE 0.9 % IV SOLN: 16.0000 mg/kg | Freq: Once | INTRAVENOUS | Status: DC

## 2019-03-08 MED ORDER — SODIUM CHLORIDE 0.9% FLUSH
10.0000 mL | Freq: Once | INTRAVENOUS | Status: AC
  Administered 2019-03-08: 10 mL
  Filled 2019-03-08: qty 10

## 2019-03-08 MED ORDER — ACETAMINOPHEN 325 MG PO TABS
650.0000 mg | ORAL_TABLET | Freq: Once | ORAL | Status: AC
  Administered 2019-03-08: 650 mg via ORAL

## 2019-03-08 MED ORDER — ACETAMINOPHEN 325 MG PO TABS
ORAL_TABLET | ORAL | Status: AC
  Filled 2019-03-08: qty 2

## 2019-03-08 MED ORDER — HEPARIN SOD (PORK) LOCK FLUSH 100 UNIT/ML IV SOLN
500.0000 [IU] | Freq: Once | INTRAVENOUS | Status: AC | PRN
  Administered 2019-03-08: 500 [IU]
  Filled 2019-03-08: qty 5

## 2019-03-08 MED ORDER — SODIUM CHLORIDE 0.9% FLUSH
10.0000 mL | INTRAVENOUS | Status: DC | PRN
  Administered 2019-03-08: 10 mL
  Filled 2019-03-08: qty 10

## 2019-03-08 MED ORDER — DIPHENHYDRAMINE HCL 25 MG PO CAPS
50.0000 mg | ORAL_CAPSULE | Freq: Once | ORAL | Status: AC
  Administered 2019-03-08: 50 mg via ORAL

## 2019-03-08 MED ORDER — METHYLPREDNISOLONE SODIUM SUCC 125 MG IJ SOLR
INTRAMUSCULAR | Status: AC
  Filled 2019-03-08: qty 2

## 2019-03-08 MED ORDER — METHYLPREDNISOLONE SODIUM SUCC 125 MG IJ SOLR
100.0000 mg | Freq: Once | INTRAMUSCULAR | Status: AC
  Administered 2019-03-08: 100 mg via INTRAVENOUS

## 2019-03-08 MED ORDER — DEXAMETHASONE 4 MG PO TABS: 20.0000 mg | ORAL_TABLET | Freq: Once | ORAL | Status: DC

## 2019-03-08 MED ORDER — DIPHENHYDRAMINE HCL 25 MG PO CAPS
ORAL_CAPSULE | ORAL | Status: AC
  Filled 2019-03-08: qty 2

## 2019-03-08 NOTE — Progress Notes (Signed)
Hematology and Oncology Follow Up Visit  KEELAND TALKINGTON SW:[id number] Oct 13, 1933 85 y.o. 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 3 weeks for hemoglobin less than 10   Interim History:  Mr. Amado is here today for follow-up.  So far, he is tolerated the daratumumab pretty well.  He really has had no problems with this.  I am very impressed with his blood pressure.  He is working very closely with his family doctor to help with his blood pressure control.  The myeloma is responding quite nicely to treatment.  His last monoclonal spike back in September was 0.2 g/dL.  His IgG level was 700 mg/dL.  His kappa light chain was 1.1 mg/dL.  He does go back to [employer] every couple weeks.  He has a house there.  He has had no problems with headache.  He has had no cough.  He has had no problems with bowels or bladder.  Currently, I would say performance status is ECOG 1.   Medications:  Allergies as of 03/08/2019   No Known Allergies     Medication List       Accurate as of March 08, 2019  8:56 AM. If you have any questions, ask your nurse or doctor.        aspirin 81 MG tablet Take 81 mg by mouth daily.   bimatoprost 0.01 % Soln Commonly known as: LUMIGAN Place 1 drop into both eyes at bedtime.   CALTRATE 600+D PLUS PO Take 1 tablet by mouth daily.   carvedilol 12.5 MG tablet Commonly known as: COREG Take 1 tablet (12.5 mg total) by mouth 2 (two) times daily with a meal.   cloNIDine 0.1 MG tablet Commonly known as: CATAPRES Take 1 tablet (0.1 mg total) by mouth  daily. Take an extra tablet as needed for blood pressure greater than 170/100 (x1 per day)   [employer]S VITAMIN B12 2000 MCG tablet Generic drug: cyanocobalamin Take 2,000 mcg by mouth at bedtime.   famciclovir 250 MG tablet Commonly known as: FAMVIR TAKE 1 TABLET DAILY   furosemide 20 MG tablet Commonly known as: LASIX Take 2 tablets (40 mg total) by mouth daily.   HCA LAX-X PO Take by mouth every three (3) days as needed.   hydrochlorothiazide 25 MG tablet Commonly known as: HYDRODIURIL 25 mg daily.   losartan 100 MG tablet Commonly known as: COZAAR Take 100 mg by mouth daily.   metolazone 5 MG tablet Commonly known as: ZAROXOLYN Take 1 pill 1 hour prior to taking Lasix daily   multivitamin with minerals Tabs tablet Take 1 tablet by mouth daily.   potassium chloride SA 20 MEQ tablet Commonly known as: KLOR-CON Take 20 mEq by mouth daily.   pravastatin 20 MG tablet Commonly known as: PRAVACHOL Take 20 mg by mouth at bedtime.   SYSTANE OP Place 1 drop into both eyes daily as needed (dry eyes).   traMADol 50 MG tablet Commonly known as: ULTRAM Take 1 tablet (50 mg total) by mouth every 6 (six) hours as needed.   Vitamin D3  25 MCG (1000 UT) Caps Take 1,000 Units by mouth daily.   Zometa 4 MG/100ML IVPB Generic drug: Zoledronic Acid Inject 4 mg into the vein every 4 (four) months. Administered by Dr. Jonette Eva at [facility] - last infusion mid July 2019       Allergies: No Known Allergies  Past Medical History, Surgical history, Social history, and Family History were reviewed and updated.  Review of Systems: Review of Systems  Constitutional: Negative.   HENT: Negative.   Eyes: Negative.   Respiratory: Negative.   Cardiovascular: Positive for leg swelling.  Gastrointestinal: Positive for heartburn.  Genitourinary: Negative.   Musculoskeletal: Positive for joint pain.  Skin: Negative.   Neurological: Negative.   Endo/Heme/Allergies: Negative.    Psychiatric/Behavioral: Negative.      Physical Exam:  weight is 211 lb (95.7 kg). His temporal temperature is 97.1 F (36.2 C) (abnormal). His blood pressure is 166/85 (abnormal) and his pulse is 59 (abnormal). His respiration is 18 and oxygen saturation is 100%.   Wt Readings from Last 3 Encounters:  03/08/19 211 lb (95.7 kg)  02/08/19 201 lb (91.2 kg)  02/07/19 203 lb 12.8 oz (92.4 kg)    Physical Exam Vitals signs reviewed.  HENT:     Head: Normocephalic and atraumatic.  Eyes:     Pupils: Pupils are equal, round, and reactive to light.  Neck:     Musculoskeletal: Normal range of motion.  Cardiovascular:     Rate and Rhythm: Normal rate and regular rhythm.     Heart sounds: Normal heart sounds.  Pulmonary:     Effort: Pulmonary effort is normal.     Breath sounds: Normal breath sounds.  Abdominal:     General: Bowel sounds are normal.     Palpations: Abdomen is soft.     Comments: Abdominal exam is obese but soft.  He has good bowel sounds.  There is no fluid wave.  There is no palpable liver or spleen tip.  Musculoskeletal: Normal range of motion.        General: No tenderness or deformity.     Comments: Extremities shows 1+ edema in his lower legs and feet.  This is mildly pitting.  He has decent range of motion of his joints.  He has decent strength.  Lymphadenopathy:     Cervical: No cervical adenopathy.  Skin:    General: Skin is warm and dry.     Findings: No erythema or rash.  Neurological:     Mental Status: He is alert and oriented to person, place, and time.  Psychiatric:        Behavior: Behavior normal.        Thought Content: Thought content normal.        Judgment: Judgment normal.      Lab Results  Component Value Date   WBC 5.3 03/08/2019   HGB 10.1 (L) 03/08/2019   HCT 29.9 (L) 03/08/2019   MCV 100.3 (H) 03/08/2019   PLT 147 (L) 03/08/2019   Lab Results  Component Value Date   FERRITIN 1,614 (H) 02/08/2019   IRON 102 02/08/2019    TIBC 246 02/08/2019   UIBC 144 02/08/2019   IRONPCTSAT 42 02/08/2019   Lab Results  Component Value Date   RETICCTPCT 1.3 02/08/2019   RBC 2.98 (L) 03/08/2019   Lab Results  Component Value Date   KPAFRELGTCHN 11.2 02/08/2019   LAMBDASER 8.2 02/08/2019   KAPLAMBRATIO 1.37 02/08/2019   Lab Results  Component Value  Date   IGGSERUM 694 02/08/2019   IGGSERUM 696 02/08/2019   IGA 15 (L) 02/08/2019   IGA 17 (L) 02/08/2019   IGMSERUM 33 02/08/2019   IGMSERUM 34 02/08/2019   Lab Results  Component Value Date   TOTALPROTELP 5.9 (L) 02/08/2019   ALBUMINELP 3.5 02/08/2019   A1GS 0.2 02/08/2019   A2GS 0.8 02/08/2019   BETS 0.8 02/08/2019   BETA2SER 0.3 01/03/2015   GAMS 0.6 02/08/2019   MSPIKE 0.2 (H) 02/08/2019   SPEI Comment 01/11/2019     Chemistry      Component Value Date/Time   NA 138 03/08/2019 0810   NA 137 04/08/2017 1141   NA 138 10/29/2015 1029   K 3.6 03/08/2019 0810   K 3.4 04/08/2017 1141   K 3.9 10/29/2015 1029   CL 105 03/08/2019 0810   CL 102 04/08/2017 1141   CO2 27 03/08/2019 0810   CO2 30 04/08/2017 1141   CO2 27 10/29/2015 1029   BUN 18 03/08/2019 0810   BUN 16 04/08/2017 1141   BUN 13.8 10/29/2015 1029   CREATININE 1.04 03/08/2019 0810   CREATININE 1.3 (H) 04/08/2017 1141   CREATININE 1.1 10/29/2015 1029      Component Value Date/Time   CALCIUM 9.5 03/08/2019 0810   CALCIUM 9.3 04/08/2017 1141   CALCIUM 9.3 10/29/2015 1029   ALKPHOS 44 03/08/2019 0810   ALKPHOS 66 04/08/2017 1141   ALKPHOS 52 10/29/2015 1029   AST 15 03/08/2019 0810   AST 20 10/29/2015 1029   ALT 13 03/08/2019 0810   ALT 21 04/08/2017 1141   ALT 14 10/29/2015 1029   BILITOT 0.9 03/08/2019 0810   BILITOT 0.75 10/29/2015 1029       Impression and Plan: Mr. Ally is a very pleasant 85 yo African American gentleman with relapsedIgG kappamyeloma.He has a history of stem cell transplant back in 2006.  It is apparent that the daratumumab is helping quite a bit.  We now  can switch the subcutaneous version of daratumumab.  I do this would be quite helpful for him.  He will had been in the office for as long.  I know he does enjoy being here because he really likes our nurses.  I think he gets treated in 2 more weeks.  I will see him back in a month.     Volanda Napoleon, MD 10/14/20208:56 AM

## 2019-03-08 NOTE — Patient Instructions (Signed)
[facility] Discharge Instructions for Patients Receiving Chemotherapy  Today you received the following chemotherapy agents:  Darzalex  To help prevent nausea and vomiting after your treatment, we encourage you to take your nausea medication as prescribed.   If you develop nausea and vomiting that is not controlled by your nausea medication, call the clinic.   BELOW ARE SYMPTOMS THAT SHOULD BE REPORTED IMMEDIATELY:  *FEVER GREATER THAN 100.5 F  *CHILLS WITH OR WITHOUT FEVER  NAUSEA AND VOMITING THAT IS NOT CONTROLLED WITH YOUR NAUSEA MEDICATION  *UNUSUAL SHORTNESS OF BREATH  *UNUSUAL BRUISING OR BLEEDING  TENDERNESS IN MOUTH AND THROAT WITH OR WITHOUT PRESENCE OF ULCERS  *URINARY PROBLEMS  *BOWEL PROBLEMS  UNUSUAL RASH Items with * indicate a potential emergency and should be followed up as soon as possible.  Feel free to call the clinic should you have any questions or concerns. The clinic phone number is [phone].  Please show the CHEMO ALERT CARD at check-in to the Emergency Department and triage nurse.   

## 2019-03-08 NOTE — Progress Notes (Signed)
Pt. Stated that he's feeling "shaky" at 48. BP 205/87, Dr. Marin Olp aware and said it's ok to proceed with the treatment.

## 2019-03-08 NOTE — Patient Instructions (Signed)

## 2019-03-08 NOTE — Progress Notes (Signed)
Per MD with change to faspro today.  No prior auth needed per Baxter Flattery.

## 2019-03-09 LAB — IGG, IGA, IGM
IgA: 19 mg/dL — ABNORMAL LOW (ref 61–437)
IgG (Immunoglobin G), Serum: 596 mg/dL — ABNORMAL LOW (ref 603–1613)
IgM (Immunoglobulin M), Srm: 29 mg/dL (ref 15–143)

## 2019-03-09 LAB — KAPPA/LAMBDA LIGHT CHAINS
Kappa free light chain: 11.7 mg/L (ref 3.3–19.4)
Kappa, lambda light chain ratio: 1.56 (ref 0.26–1.65)
Lambda free light chains: 7.5 mg/L (ref 5.7–26.3)

## 2019-03-13 LAB — IMMUNOFIXATION REFLEX, SERUM
IgA: 18 mg/dL — ABNORMAL LOW (ref 61–437)
IgG (Immunoglobin G), Serum: 639 mg/dL (ref 603–1613)
IgM (Immunoglobulin M), Srm: 26 mg/dL (ref 15–143)

## 2019-03-13 LAB — PROTEIN ELECTROPHORESIS, SERUM, WITH REFLEX
A/G Ratio: 1.6 (ref 0.7–1.7)
Albumin ELP: 3.5 g/dL (ref 2.9–4.4)
Alpha-1-Globulin: 0.2 g/dL (ref 0.0–0.4)
Alpha-2-Globulin: 0.8 g/dL (ref 0.4–1.0)
Beta Globulin: 0.7 g/dL (ref 0.7–1.3)
Gamma Globulin: 0.5 g/dL (ref 0.4–1.8)
Globulin, Total: 2.2 g/dL (ref 2.2–3.9)
M-Spike, %: 0.2 g/dL — ABNORMAL HIGH
SPEP Interpretation: 0
Total Protein ELP: 5.7 g/dL — ABNORMAL LOW (ref 6.0–8.5)

## 2019-03-18 IMAGING — CT CT BIOPSY
1 of 2 series · 15 of 29 positions shown, 19 images · non-contrast
Comparison: none

INDICATION: 84-year-old male with a history of anemia, and multiple myeloma,
status post bone marrow transplant

[Series 2: i-spiral 5.0 b40f · axial · 0.98mm/px · z∈[-57,+34]mm · 15 of 30 slices shown, 19 images]
[im 2/30  mediastinal]
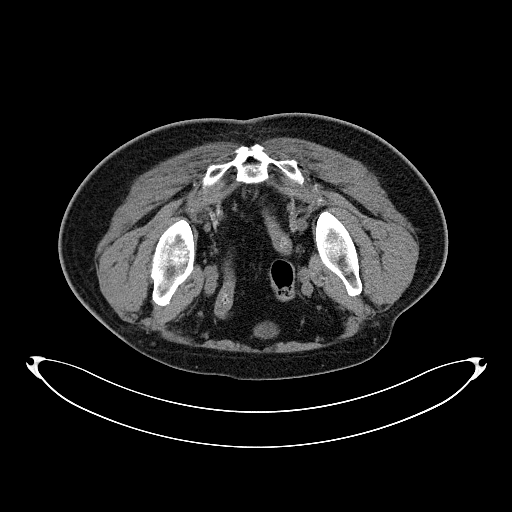
[im 2/30  lung]
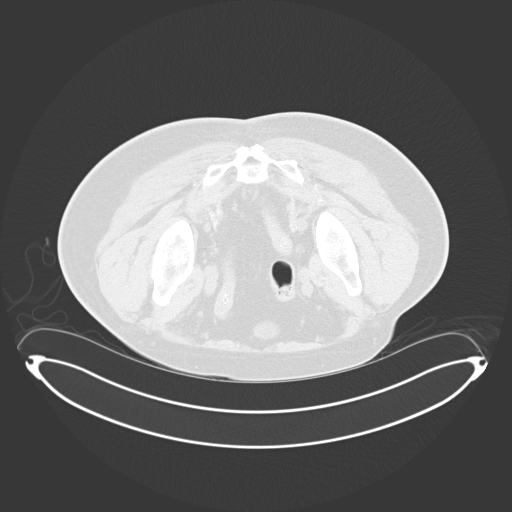
[im 4/30  lung]
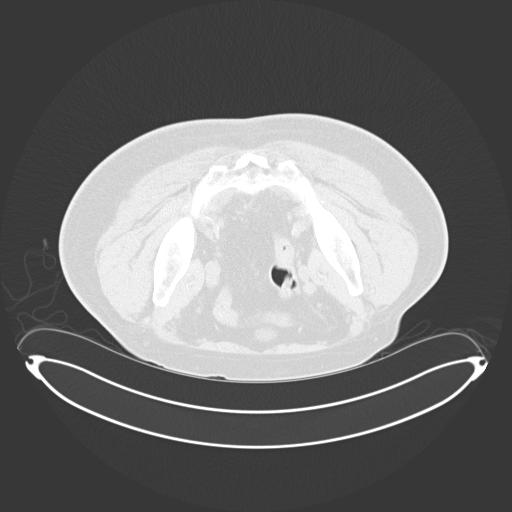
[im 6/30  lung]
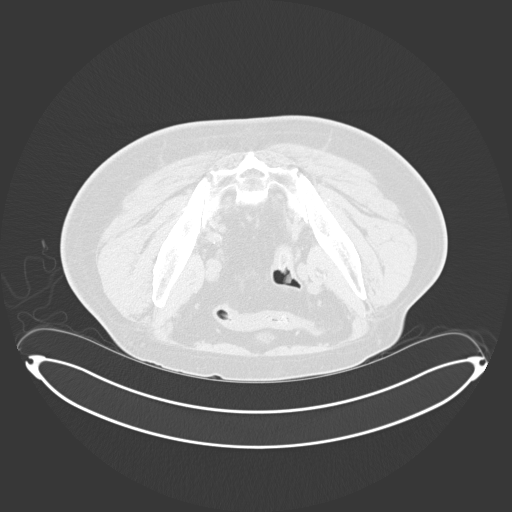
[im 8/30  lung]
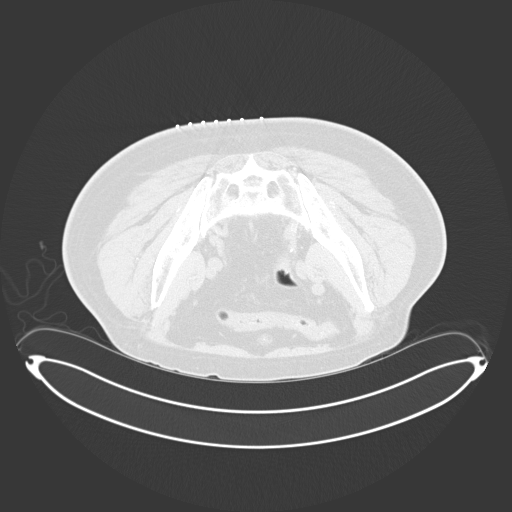
[im 10/30  mediastinal]
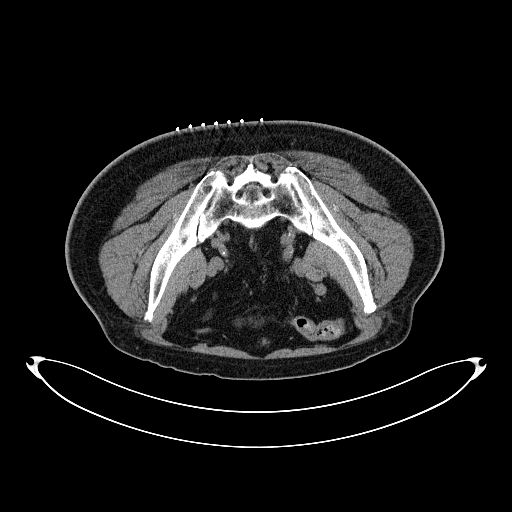
[im 10/30  lung]
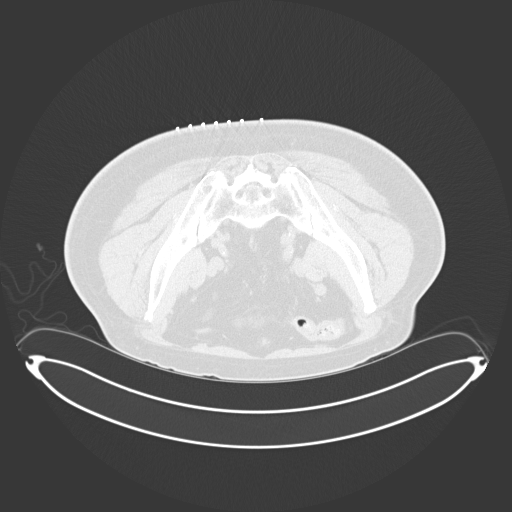
[im 12/30  lung]
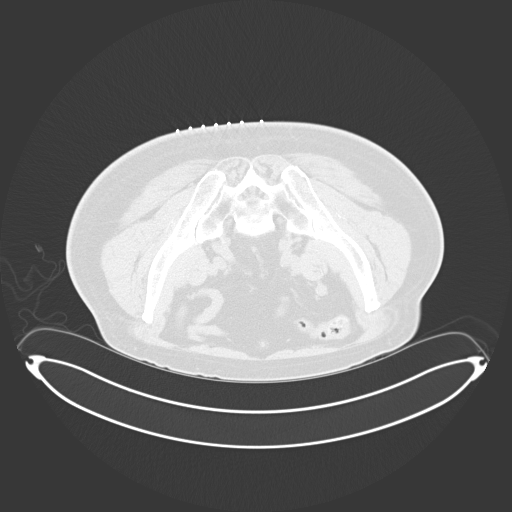
[im 13/30  lung]
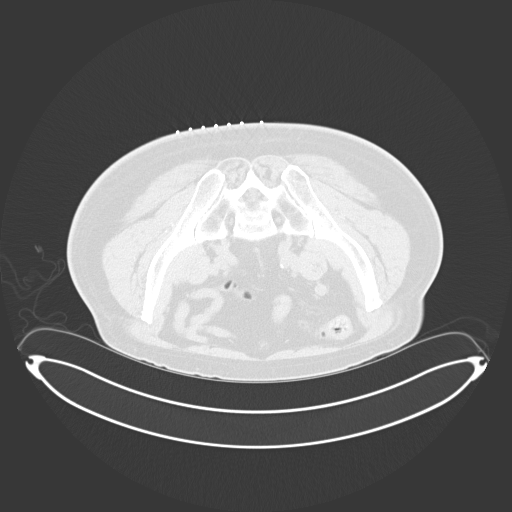
[im 15/30  lung]
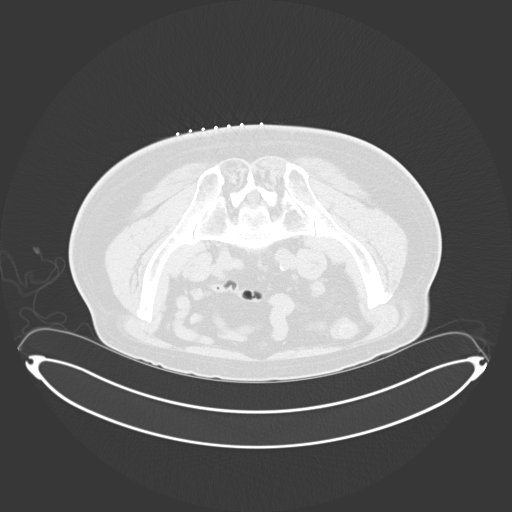
[im 16/30  mediastinal]
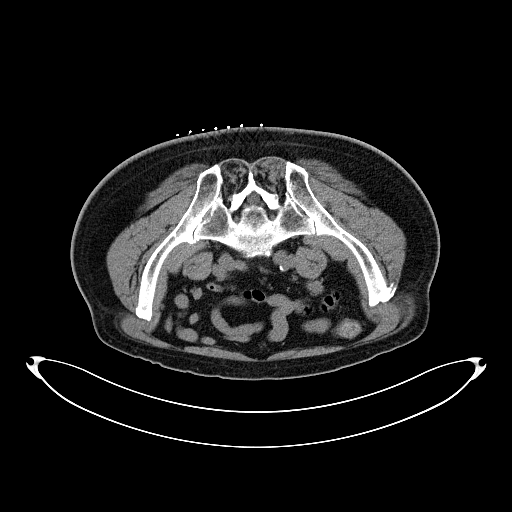
[im 16/30  lung]
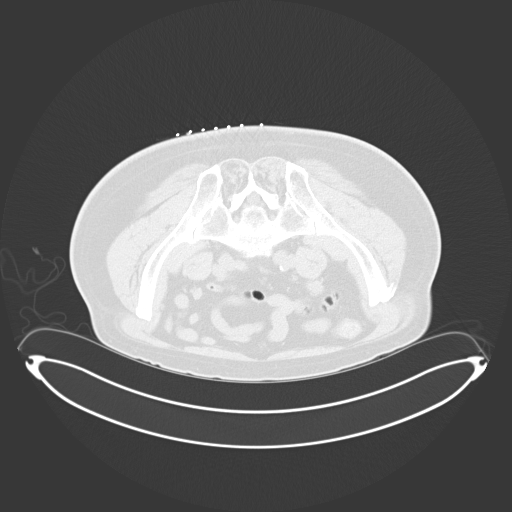
[im 18/30  lung]
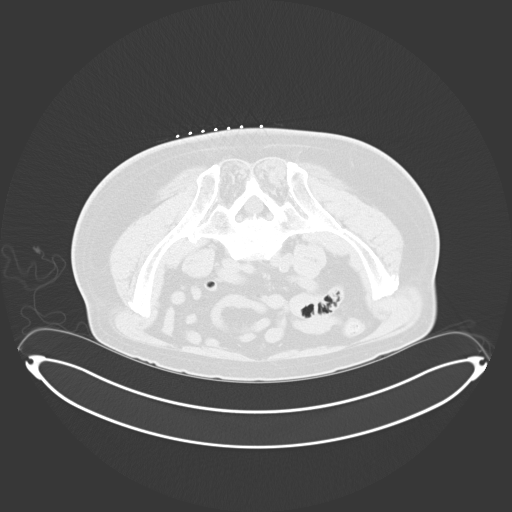
[im 20/30  lung]
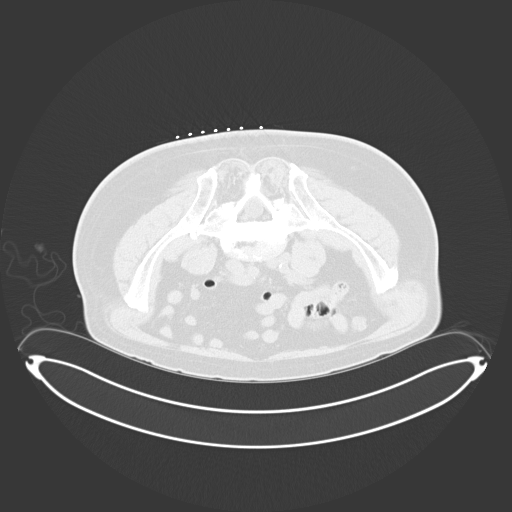
[im 22/30  lung]
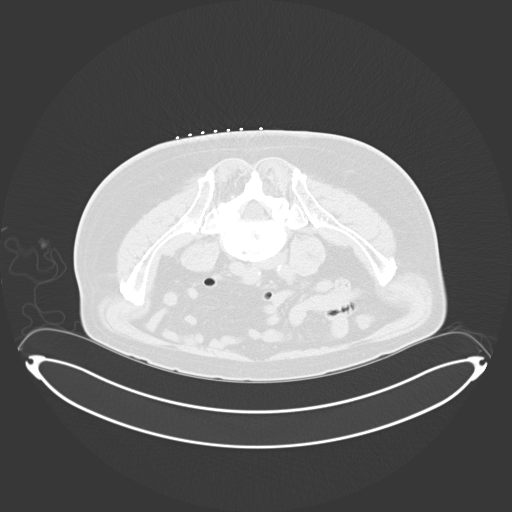
[im 24/30  mediastinal]
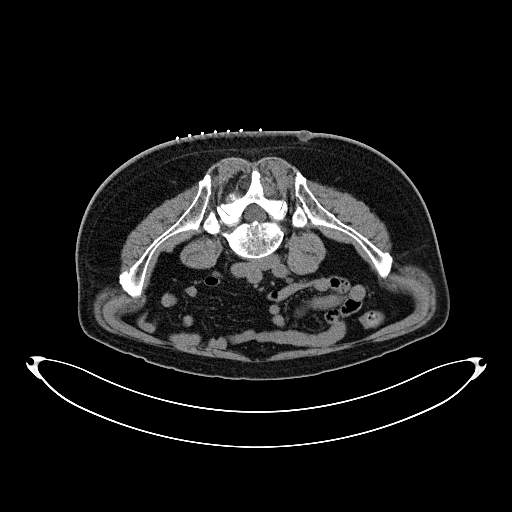
[im 24/30  lung]
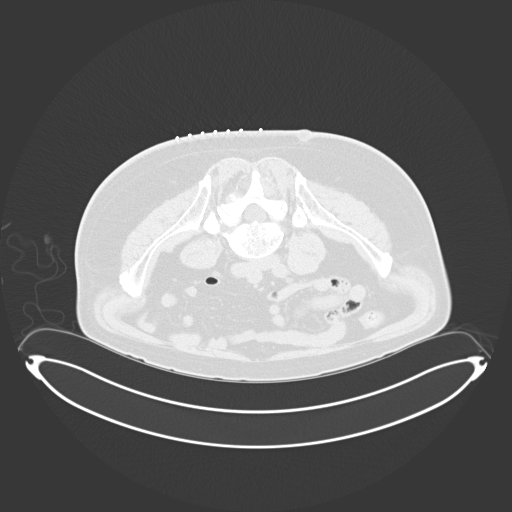
[im 26/30  lung]
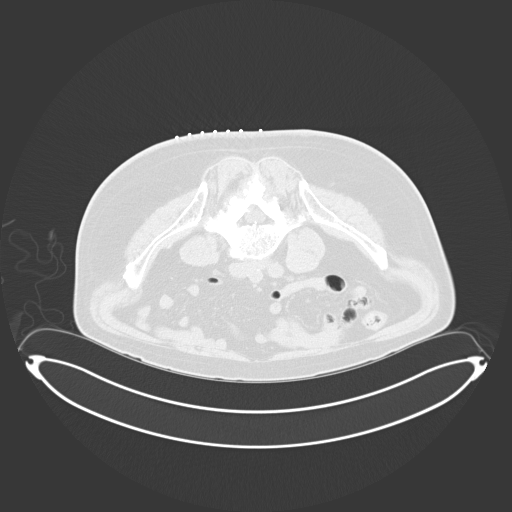
[im 28/30  lung]
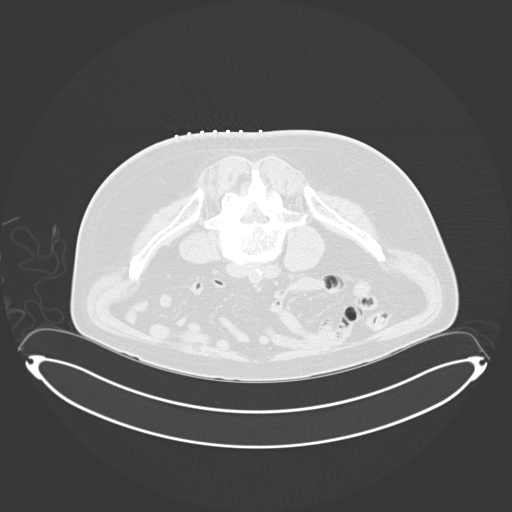

[15 of 29 positions shown; findings below may reference images not displayed]

EXAM:
CT BONE MARROW BIOPSY AND ASPIRATION; CT BIOPSY

MEDICATIONS:
None.

ANESTHESIA/SEDATION:
Moderate (conscious) sedation was employed during this procedure. A
total of Versed 2.0 mg and Fentanyl 100 mcg was administered
intravenously.

Moderate Sedation Time: 14 minutes. The patient's level of
consciousness and vital signs were monitored continuously by
radiology nursing throughout the procedure under my direct
supervision.

FLUOROSCOPY TIME:  None

COMPLICATIONS:
None

PROCEDURE:
The procedure risks, benefits, and alternatives were explained to
the patient. Questions regarding the procedure were encouraged and
answered. The patient understands and consents to the procedure.

Scout CT of the pelvis was performed for surgical planning purposes.

The posterior pelvis was prepped with chlorhexidinein a sterile
fashion, and a sterile drape was applied covering the operative
field. A sterile gown and sterile gloves were used for the
procedure. Local anesthesia was provided with 1% Lidocaine.

We targeted the left posterior iliac bone for biopsy. The skin and
subcutaneous tissues were infiltrated with 1% lidocaine without
epinephrine. A small stab incision was made with an 11 blade
scalpel, and an 11 gauge Fiander needle was advanced with CT guidance
to the posterior cortex. Manual forced was used to advance the
needle through the posterior cortex and the stylet was removed. A
bone marrow aspirate was retrieved and passed to a cytotechnologist
in the room. The Fiander needle was then advanced without the stylet
for a core biopsy. The core biopsy was retrieved and also passed to
a cytotechnologist. A second core biopsy was performed.

Manual pressure was used for hemostasis and a sterile dressing was
placed.

No complications were encountered no significant blood loss was
encountered.

Patient tolerated the procedure well and remained hemodynamically
stable throughout.
IMPRESSION: Status post CT-guided bone marrow biopsy, with tissue specimen sent
to pathology for complete histopathologic analysis

## 2019-03-21 ENCOUNTER — Other Ambulatory Visit: Payer: Self-pay | Admitting: *Deleted

## 2019-03-21 ENCOUNTER — Other Ambulatory Visit: Payer: Self-pay | Admitting: Family

## 2019-03-21 DIAGNOSIS — C9000 Multiple myeloma not having achieved remission: Secondary | ICD-10-CM

## 2019-03-21 DIAGNOSIS — C9002 Multiple myeloma in relapse: Secondary | ICD-10-CM

## 2019-03-22 ENCOUNTER — Inpatient Hospital Stay: Payer: Medicare Other

## 2019-03-22 ENCOUNTER — Other Ambulatory Visit: Payer: Self-pay

## 2019-03-22 VITALS — BP 164/80 | HR 53

## 2019-03-22 DIAGNOSIS — C9002 Multiple myeloma in relapse: Secondary | ICD-10-CM | POA: Diagnosis not present

## 2019-03-22 DIAGNOSIS — Z79899 Other long term (current) drug therapy: Secondary | ICD-10-CM | POA: Diagnosis not present

## 2019-03-22 DIAGNOSIS — D469 Myelodysplastic syndrome, unspecified: Secondary | ICD-10-CM | POA: Diagnosis not present

## 2019-03-22 DIAGNOSIS — C9000 Multiple myeloma not having achieved remission: Secondary | ICD-10-CM

## 2019-03-22 DIAGNOSIS — D630 Anemia in neoplastic disease: Secondary | ICD-10-CM | POA: Diagnosis not present

## 2019-03-22 DIAGNOSIS — Z5112 Encounter for antineoplastic immunotherapy: Secondary | ICD-10-CM | POA: Diagnosis not present

## 2019-03-22 LAB — CBC WITH DIFFERENTIAL (CANCER CENTER ONLY)
Abs Immature Granulocytes: 0.02 10*3/uL (ref 0.00–0.07)
Basophils Absolute: 0 10*3/uL (ref 0.0–0.1)
Basophils Relative: 0 %
Eosinophils Absolute: 0.1 10*3/uL (ref 0.0–0.5)
Eosinophils Relative: 2 %
HCT: 30.2 % — ABNORMAL LOW (ref 39.0–52.0)
Hemoglobin: 10.1 g/dL — ABNORMAL LOW (ref 13.0–17.0)
Immature Granulocytes: 0 %
Lymphocytes Relative: 30 %
Lymphs Abs: 1.7 10*3/uL (ref 0.7–4.0)
MCH: 34 pg (ref 26.0–34.0)
MCHC: 33.4 g/dL (ref 30.0–36.0)
MCV: 101.7 fL — ABNORMAL HIGH (ref 80.0–100.0)
Monocytes Absolute: 0.6 10*3/uL (ref 0.1–1.0)
Monocytes Relative: 11 %
Neutro Abs: 3.1 10*3/uL (ref 1.7–7.7)
Neutrophils Relative %: 57 %
Platelet Count: 171 10*3/uL (ref 150–400)
RBC: 2.97 MIL/uL — ABNORMAL LOW (ref 4.22–5.81)
RDW: 13.5 % (ref 11.5–15.5)
WBC Count: 5.5 10*3/uL (ref 4.0–10.5)
nRBC: 0 % (ref 0.0–0.2)

## 2019-03-22 LAB — CMP (CANCER CENTER ONLY)
ALT: 11 U/L (ref 0–44)
AST: 15 U/L (ref 15–41)
Albumin: 4.3 g/dL (ref 3.5–5.0)
Alkaline Phosphatase: 47 U/L (ref 38–126)
Anion gap: 8 (ref 5–15)
BUN: 22 mg/dL (ref 8–23)
CO2: 28 mmol/L (ref 22–32)
Calcium: 9.8 mg/dL (ref 8.9–10.3)
Chloride: 100 mmol/L (ref 98–111)
Creatinine: 1.3 mg/dL — ABNORMAL HIGH (ref 0.61–1.24)
GFR, Est AFR Am: 58 mL/min — ABNORMAL LOW (ref >60)
GFR, Estimated: 50 mL/min — ABNORMAL LOW (ref >60)
Glucose, Bld: 110 mg/dL — ABNORMAL HIGH (ref 70–99)
Potassium: 3.4 mmol/L — ABNORMAL LOW (ref 3.5–5.1)
Sodium: 136 mmol/L (ref 135–145)
Total Bilirubin: 0.8 mg/dL (ref 0.3–1.2)
Total Protein: 6 g/dL — ABNORMAL LOW (ref 6.5–8.1)

## 2019-03-22 MED ORDER — DEXAMETHASONE 4 MG PO TABS
20.0000 mg | ORAL_TABLET | Freq: Once | ORAL | Status: AC
  Administered 2019-03-22: 20 mg via ORAL

## 2019-03-22 MED ORDER — ACETAMINOPHEN 325 MG PO TABS
650.0000 mg | ORAL_TABLET | Freq: Once | ORAL | Status: AC
  Administered 2019-03-22: 650 mg via ORAL

## 2019-03-22 MED ORDER — DARATUMUMAB-HYALURONIDASE-FIHJ 1800-30000 MG-UT/15ML ~~LOC~~ SOLN
1800.0000 mg | Freq: Once | SUBCUTANEOUS | Status: AC
  Administered 2019-03-22: 1800 mg via SUBCUTANEOUS
  Filled 2019-03-22: qty 15

## 2019-03-22 MED ORDER — SODIUM CHLORIDE 0.9% FLUSH
10.0000 mL | INTRAVENOUS | Status: DC | PRN
  Administered 2019-03-22: 10 mL
  Filled 2019-03-22: qty 10

## 2019-03-22 MED ORDER — ACETAMINOPHEN 325 MG PO TABS
ORAL_TABLET | ORAL | Status: AC
  Filled 2019-03-22: qty 2

## 2019-03-22 MED ORDER — DIPHENHYDRAMINE HCL 25 MG PO CAPS
ORAL_CAPSULE | ORAL | Status: AC
  Filled 2019-03-22: qty 2

## 2019-03-22 MED ORDER — DEXAMETHASONE 4 MG PO TABS
ORAL_TABLET | ORAL | Status: AC
  Filled 2019-03-22: qty 5

## 2019-03-22 MED ORDER — DIPHENHYDRAMINE HCL 25 MG PO CAPS
50.0000 mg | ORAL_CAPSULE | Freq: Once | ORAL | Status: AC
  Administered 2019-03-22: 50 mg via ORAL

## 2019-03-22 MED ORDER — HEPARIN SOD (PORK) LOCK FLUSH 100 UNIT/ML IV SOLN
500.0000 [IU] | Freq: Once | INTRAVENOUS | Status: AC | PRN
  Administered 2019-03-22: 500 [IU]
  Filled 2019-03-22: qty 5

## 2019-03-22 NOTE — Patient Instructions (Signed)
[facility] Discharge Instructions for Patients Receiving Chemotherapy  Today you received the following chemotherapy agents:  Darzalex  To help prevent nausea and vomiting after your treatment, we encourage you to take your nausea medication as prescribed.   If you develop nausea and vomiting that is not controlled by your nausea medication, call the clinic.   BELOW ARE SYMPTOMS THAT SHOULD BE REPORTED IMMEDIATELY:  *FEVER GREATER THAN 100.5 F  *CHILLS WITH OR WITHOUT FEVER  NAUSEA AND VOMITING THAT IS NOT CONTROLLED WITH YOUR NAUSEA MEDICATION  *UNUSUAL SHORTNESS OF BREATH  *UNUSUAL BRUISING OR BLEEDING  TENDERNESS IN MOUTH AND THROAT WITH OR WITHOUT PRESENCE OF ULCERS  *URINARY PROBLEMS  *BOWEL PROBLEMS  UNUSUAL RASH Items with * indicate a potential emergency and should be followed up as soon as possible.  Feel free to call the clinic should you have any questions or concerns. The clinic phone number is [phone].  Please show the CHEMO ALERT CARD at check-in to the Emergency Department and triage nurse.   

## 2019-03-23 ENCOUNTER — Telehealth: Payer: Self-pay | Admitting: Emergency Medicine

## 2019-03-23 NOTE — Telephone Encounter (Signed)
Called patient to check on his blood pressure and how it was doing. He reports it is doing well. His readings today were 134/83, and 130/81. He will continue monitoring.  I advised him to let us know if it changes to let us know.

## 2019-03-28 ENCOUNTER — Other Ambulatory Visit: Payer: Self-pay | Admitting: Hematology & Oncology

## 2019-03-28 ENCOUNTER — Telehealth: Payer: Self-pay | Admitting: *Deleted

## 2019-03-28 ENCOUNTER — Ambulatory Visit: Payer: Medicare Other | Admitting: Cardiology

## 2019-03-28 NOTE — Telephone Encounter (Signed)
Patient called stating that he has an inch sized red area where the Darzalex Subq was given on 03/22/2019.  No swelling, warmth, blistering or pain.  Consulted with Carolynne Edouard and Dr Marin Olp.  Both agreed to put ice on area prn and to let us know if area gets worse.

## 2019-04-05 ENCOUNTER — Inpatient Hospital Stay (HOSPITAL_BASED_OUTPATIENT_CLINIC_OR_DEPARTMENT_OTHER): Payer: Medicare Other | Admitting: Hematology & Oncology

## 2019-04-05 ENCOUNTER — Other Ambulatory Visit: Payer: Self-pay

## 2019-04-05 ENCOUNTER — Inpatient Hospital Stay: Payer: Medicare Other

## 2019-04-05 ENCOUNTER — Inpatient Hospital Stay: Payer: Medicare Other | Attending: Hematology & Oncology

## 2019-04-05 ENCOUNTER — Encounter: Payer: Self-pay | Admitting: Hematology & Oncology

## 2019-04-05 VITALS — BP 191/90 | HR 65 | Temp 97.5°F | Resp 20 | Wt 211.0 lb

## 2019-04-05 VITALS — BP 172/74 | HR 58

## 2019-04-05 DIAGNOSIS — C9002 Multiple myeloma in relapse: Secondary | ICD-10-CM

## 2019-04-05 DIAGNOSIS — Z5112 Encounter for antineoplastic immunotherapy: Secondary | ICD-10-CM | POA: Diagnosis not present

## 2019-04-05 DIAGNOSIS — Z79899 Other long term (current) drug therapy: Secondary | ICD-10-CM | POA: Insufficient documentation

## 2019-04-05 LAB — CBC WITH DIFFERENTIAL (CANCER CENTER ONLY)
Abs Immature Granulocytes: 0.06 10*3/uL (ref 0.00–0.07)
Basophils Absolute: 0 10*3/uL (ref 0.0–0.1)
Basophils Relative: 0 %
Eosinophils Absolute: 0.1 10*3/uL (ref 0.0–0.5)
Eosinophils Relative: 2 %
HCT: 29.1 % — ABNORMAL LOW (ref 39.0–52.0)
Hemoglobin: 9.8 g/dL — ABNORMAL LOW (ref 13.0–17.0)
Immature Granulocytes: 1 %
Lymphocytes Relative: 26 %
Lymphs Abs: 1.4 10*3/uL (ref 0.7–4.0)
MCH: 34.9 pg — ABNORMAL HIGH (ref 26.0–34.0)
MCHC: 33.7 g/dL (ref 30.0–36.0)
MCV: 103.6 fL — ABNORMAL HIGH (ref 80.0–100.0)
Monocytes Absolute: 0.5 10*3/uL (ref 0.1–1.0)
Monocytes Relative: 10 %
Neutro Abs: 3.4 10*3/uL (ref 1.7–7.7)
Neutrophils Relative %: 61 %
Platelet Count: 170 10*3/uL (ref 150–400)
RBC: 2.81 MIL/uL — ABNORMAL LOW (ref 4.22–5.81)
RDW: 13 % (ref 11.5–15.5)
WBC Count: 5.5 10*3/uL (ref 4.0–10.5)
nRBC: 0 % (ref 0.0–0.2)

## 2019-04-05 LAB — CMP (CANCER CENTER ONLY)
ALT: 11 U/L (ref 0–44)
AST: 15 U/L (ref 15–41)
Albumin: 4.1 g/dL (ref 3.5–5.0)
Alkaline Phosphatase: 40 U/L (ref 38–126)
Anion gap: 6 (ref 5–15)
BUN: 20 mg/dL (ref 8–23)
CO2: 27 mmol/L (ref 22–32)
Calcium: 9.4 mg/dL (ref 8.9–10.3)
Chloride: 106 mmol/L (ref 98–111)
Creatinine: 1.1 mg/dL (ref 0.61–1.24)
GFR, Est AFR Am: >60 mL/min (ref >60)
GFR, Estimated: >60 mL/min (ref >60)
Glucose, Bld: 100 mg/dL — ABNORMAL HIGH (ref 70–99)
Potassium: 3.9 mmol/L (ref 3.5–5.1)
Sodium: 139 mmol/L (ref 135–145)
Total Bilirubin: 0.7 mg/dL (ref 0.3–1.2)
Total Protein: 5.7 g/dL — ABNORMAL LOW (ref 6.5–8.1)

## 2019-04-05 LAB — IRON AND TIBC
Iron: 106 ug/dL (ref 42–163)
Saturation Ratios: 41 % (ref 20–55)
TIBC: 259 ug/dL (ref 202–409)
UIBC: 152 ug/dL (ref 117–376)

## 2019-04-05 LAB — RETICULOCYTES
Immature Retic Fract: 12.4 % (ref 2.3–15.9)
RBC.: 2.76 MIL/uL — ABNORMAL LOW (ref 4.22–5.81)
Retic Count, Absolute: 59.9 10*3/uL (ref 19.0–186.0)
Retic Ct Pct: 2.2 % (ref 0.4–3.1)

## 2019-04-05 LAB — FERRITIN: Ferritin: 1127 ng/mL — ABNORMAL HIGH (ref 24–336)

## 2019-04-05 MED ORDER — DEXAMETHASONE 4 MG PO TABS
ORAL_TABLET | ORAL | Status: AC
  Filled 2019-04-05: qty 4

## 2019-04-05 MED ORDER — ACETAMINOPHEN 325 MG PO TABS
ORAL_TABLET | ORAL | Status: AC
  Filled 2019-04-05: qty 2

## 2019-04-05 MED ORDER — DIPHENHYDRAMINE HCL 25 MG PO CAPS
50.0000 mg | ORAL_CAPSULE | Freq: Once | ORAL | Status: AC
  Administered 2019-04-05: 50 mg via ORAL

## 2019-04-05 MED ORDER — DEXAMETHASONE 4 MG PO TABS
20.0000 mg | ORAL_TABLET | Freq: Once | ORAL | Status: AC
  Administered 2019-04-05: 20 mg via ORAL

## 2019-04-05 MED ORDER — ACETAMINOPHEN 325 MG PO TABS
650.0000 mg | ORAL_TABLET | Freq: Once | ORAL | Status: AC
  Administered 2019-04-05: 650 mg via ORAL

## 2019-04-05 MED ORDER — DIPHENHYDRAMINE HCL 25 MG PO CAPS
ORAL_CAPSULE | ORAL | Status: AC
  Filled 2019-04-05: qty 2

## 2019-04-05 MED ORDER — DARATUMUMAB-HYALURONIDASE-FIHJ 1800-30000 MG-UT/15ML ~~LOC~~ SOLN
1800.0000 mg | Freq: Once | SUBCUTANEOUS | Status: AC
  Administered 2019-04-05: 1800 mg via SUBCUTANEOUS
  Filled 2019-04-05: qty 15

## 2019-04-05 MED ORDER — SODIUM CHLORIDE 0.9% FLUSH
10.0000 mL | INTRAVENOUS | Status: DC | PRN
  Administered 2019-04-05: 10 mL
  Filled 2019-04-05: qty 10

## 2019-04-05 MED ORDER — SODIUM CHLORIDE 0.9 % IV SOLN
Freq: Once | INTRAVENOUS | Status: DC
  Filled 2019-04-05: qty 250

## 2019-04-05 MED ORDER — HEPARIN SOD (PORK) LOCK FLUSH 100 UNIT/ML IV SOLN
500.0000 [IU] | Freq: Once | INTRAVENOUS | Status: AC | PRN
  Administered 2019-04-05: 500 [IU]
  Filled 2019-04-05: qty 5

## 2019-04-05 NOTE — Patient Instructions (Signed)
Daratumumab injection What is this medicine? DARATUMUMAB (dar a toom ue mab) is a monoclonal antibody. It is used to treat multiple myeloma. This medicine may be used for other purposes; ask your health care provider or pharmacist if you have questions. COMMON BRAND NAME(S): DARZALEX What should I tell my health care provider before I take this medicine? They need to know if you have any of these conditions:  infection (especially a virus infection such as chickenpox, herpes, or hepatitis B virus)  lung or breathing disease  an unusual or allergic reaction to daratumumab, other medicines, foods, dyes, or preservatives  pregnant or trying to get pregnant  breast-feeding How should I use this medicine? This medicine is for infusion into a vein. It is given by a health care professional in a hospital or clinic setting. Talk to your pediatrician regarding the use of this medicine in children. Special care may be needed. Overdosage: If you think you have taken too much of this medicine contact a poison control center or emergency room at once. NOTE: This medicine is only for you. Do not share this medicine with others. What if I miss a dose? Keep appointments for follow-up doses as directed. It is important not to miss your dose. Call your doctor or health care professional if you are unable to keep an appointment. What may interact with this medicine? Interactions have not been studied. This list may not describe all possible interactions. Give your health care provider a list of all the medicines, herbs, non-prescription drugs, or dietary supplements you use. Also tell them if you smoke, drink alcohol, or use illegal drugs. Some items may interact with your medicine. What should I watch for while using this medicine? This drug may make you feel generally unwell. Report any side effects. Continue your course of treatment even though you feel ill unless your doctor tells you to stop. This  medicine can cause serious allergic reactions. To reduce your risk you may need to take medicine before treatment with this medicine. Take your medicine as directed. This medicine can affect the results of blood tests to match your blood type. These changes can last for up to 6 months after the final dose. Your healthcare provider will do blood tests to match your blood type before you start treatment. Tell all of your healthcare providers that you are being treated with this medicine before receiving a blood transfusion. This medicine can affect the results of some tests used to determine treatment response; extra tests may be needed to evaluate response. Do not become pregnant while taking this medicine or for 3 months after stopping it. Women should inform their doctor if they wish to become pregnant or think they might be pregnant. There is a potential for serious side effects to an unborn child. Talk to your health care professional or pharmacist for more information. What side effects may I notice from receiving this medicine? Side effects that you should report to your doctor or health care professional as soon as possible:  allergic reactions like skin rash, itching or hives, swelling of the face, lips, or tongue  breathing problems  chills  cough  dizziness  feeling faint or lightheaded  headache  low blood counts - this medicine may decrease the number of white blood cells, red blood cells and platelets. You may be at increased risk for infections and bleeding.  nausea, vomiting  shortness of breath  signs of decreased platelets or bleeding - bruising, pinpoint red spots on  the skin, black, tarry stools, blood in the urine  signs of decreased red blood cells - unusually weak or tired, feeling faint or lightheaded, falls  signs of infection - fever or chills, cough, sore throat, pain or difficulty passing urine  signs and symptoms of liver injury like dark yellow or brown  urine; general ill feeling or flu-like symptoms; light-colored stools; loss of appetite; right upper belly pain; unusually weak or tired; yellowing of the eyes or skin Side effects that usually do not require medical attention (report to your doctor or health care professional if they continue or are bothersome):  back pain  constipation  loss of appetite  diarrhea  joint pain  muscle cramps  pain, tingling, numbness in the hands or feet  swelling of the ankles, feet, hands  tiredness  trouble sleeping This list may not describe all possible side effects. Call your doctor for medical advice about side effects. You may report side effects to FDA at 1-800-FDA-1088. Where should I keep my medicine? Keep out of the reach of children. This drug is given in a hospital or clinic and will not be stored at home. NOTE: This sheet is a summary. It may not cover all possible information. If you have questions about this medicine, talk to your doctor, pharmacist, or health care provider.  2020 Elsevier/Gold Standard (2018-02-24 14:00:48)

## 2019-04-05 NOTE — Progress Notes (Signed)
Hematology and Oncology Follow Up Visit  GRANT WOODRUM SW:[id number] April 10, 1934 85 y.o. 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 3 weeks for hemoglobin less than 10   Interim History:  Mr. Rabbitt is here today for follow-up.  So far, he is tolerated the daratumumab pretty well.  He really has had no problems with this.  I am very impressed with his blood pressure.  He is working very closely with his family doctor to help with his blood pressure control.  The myeloma is responding quite nicely to treatment.  His last monoclonal spike back in October, the M spike was 0.2 g/dL.  His IgG level was 600 mg/dL.  The kappa light chain was 1.2 mg/dL.  Everything is holding nice and steady.    He does go back to [employer] every couple weeks.  He has a house there.  He has had no problems with headache.  He has had no cough.  He has had no problems with bowels or bladder.  Currently, I would say performance status is ECOG 1.   Medications:  Allergies as of 04/05/2019   No Known Allergies     Medication List       Accurate as of April 05, 2019 11:01 AM. If you have any questions, ask your nurse or doctor.        aspirin 81 MG tablet Take 81 mg by mouth daily.   bimatoprost 0.01 % Soln Commonly known as: LUMIGAN Place 1 drop into both eyes at bedtime.   CALTRATE 600+D PLUS PO Take 1 tablet by mouth daily.   carvedilol 12.5 MG tablet Commonly known as: COREG Take 1 tablet (12.5 mg total) by mouth 2 (two) times daily with a meal.   cloNIDine 0.1 MG tablet Commonly  known as: CATAPRES Take 1 tablet (0.1 mg total) by mouth daily. Take an extra tablet as needed for blood pressure greater than 170/100 (x1 per day)   [employer]S VITAMIN B12 2000 MCG tablet Generic drug: cyanocobalamin Take 2,000 mcg by mouth at bedtime.   famciclovir 250 MG tablet Commonly known as: FAMVIR TAKE 1 TABLET DAILY   furosemide 20 MG tablet Commonly known as: LASIX Take 2 tablets (40 mg total) by mouth daily.   HCA LAX-X PO Take by mouth every three (3) days as needed.   hydrochlorothiazide 25 MG tablet Commonly known as: HYDRODIURIL 25 mg daily.   losartan 100 MG tablet Commonly known as: COZAAR Take 100 mg by mouth daily.   metolazone 5 MG tablet Commonly known as: ZAROXOLYN Take 1 pill 1 hour prior to taking Lasix daily   multivitamin with minerals Tabs tablet Take 1 tablet by mouth daily.   potassium chloride SA 20 MEQ tablet Commonly known as: KLOR-CON Take 20 mEq by mouth daily.   pravastatin 20 MG tablet Commonly known as: PRAVACHOL Take 20 mg by mouth at bedtime.   SYSTANE OP Place 1 drop into both eyes daily as needed (dry eyes).   traMADol 50 MG tablet Commonly known as: ULTRAM Take 1 tablet (50 mg total) by  mouth every 6 (six) hours as needed.   Vitamin D3 25 MCG (1000 UT) Caps Take 1,000 Units by mouth daily.   Zometa 4 MG/100ML IVPB Generic drug: Zoledronic Acid Inject 4 mg into the vein every 4 (four) months. Administered by Dr. Jonette Eva at [facility] - last infusion mid July 2019       Allergies: No Known Allergies  Past Medical History, Surgical history, Social history, and Family History were reviewed and updated.  Review of Systems: Review of Systems  Constitutional: Negative.   HENT: Negative.   Eyes: Negative.   Respiratory: Negative.   Cardiovascular: Positive for leg swelling.  Gastrointestinal: Positive for heartburn.  Genitourinary: Negative.   Musculoskeletal: Positive for joint pain.  Skin: Negative.    Neurological: Negative.   Endo/Heme/Allergies: Negative.   Psychiatric/Behavioral: Negative.      Physical Exam:  weight is 211 lb (95.7 kg). His oral temperature is 97.5 F (36.4 C) (abnormal). His blood pressure is 191/90 (abnormal) and his pulse is 65. His respiration is 20 and oxygen saturation is 99%.   Wt Readings from Last 3 Encounters:  04/05/19 211 lb (95.7 kg)  03/08/19 211 lb (95.7 kg)  02/08/19 201 lb (91.2 kg)    Physical Exam Vitals signs reviewed.  HENT:     Head: Normocephalic and atraumatic.  Eyes:     Pupils: Pupils are equal, round, and reactive to light.  Neck:     Musculoskeletal: Normal range of motion.  Cardiovascular:     Rate and Rhythm: Normal rate and regular rhythm.     Heart sounds: Normal heart sounds.  Pulmonary:     Effort: Pulmonary effort is normal.     Breath sounds: Normal breath sounds.  Abdominal:     General: Bowel sounds are normal.     Palpations: Abdomen is soft.     Comments: Abdominal exam is obese but soft.  He has good bowel sounds.  There is no fluid wave.  There is no palpable liver or spleen tip.  Musculoskeletal: Normal range of motion.        General: No tenderness or deformity.     Comments: Extremities shows 1+ edema in his lower legs and feet.  This is mildly pitting.  He has decent range of motion of his joints.  He has decent strength.  Lymphadenopathy:     Cervical: No cervical adenopathy.  Skin:    General: Skin is warm and dry.     Findings: No erythema or rash.  Neurological:     Mental Status: He is alert and oriented to person, place, and time.  Psychiatric:        Behavior: Behavior normal.        Thought Content: Thought content normal.        Judgment: Judgment normal.      Lab Results  Component Value Date   WBC 5.5 04/05/2019   HGB 9.8 (L) 04/05/2019   HCT 29.1 (L) 04/05/2019   MCV 103.6 (H) 04/05/2019   PLT 170 04/05/2019   Lab Results  Component Value Date   FERRITIN 1,614 (H)  02/08/2019   IRON 102 02/08/2019   TIBC 246 02/08/2019   UIBC 144 02/08/2019   IRONPCTSAT 42 02/08/2019   Lab Results  Component Value Date   RETICCTPCT 2.2 04/05/2019   RBC 2.76 (L) 04/05/2019   RBC 2.81 (L) 04/05/2019   Lab Results  Component Value Date   KPAFRELGTCHN 11.7 03/08/2019   LAMBDASER 7.5  03/08/2019   KAPLAMBRATIO 1.56 03/08/2019   Lab Results  Component Value Date   IGGSERUM 596 (L) 03/08/2019   IGGSERUM 639 03/08/2019   IGA 19 (L) 03/08/2019   IGA 18 (L) 03/08/2019   IGMSERUM 29 03/08/2019   IGMSERUM 26 03/08/2019   Lab Results  Component Value Date   TOTALPROTELP 5.7 (L) 03/08/2019   ALBUMINELP 3.5 03/08/2019   A1GS 0.2 03/08/2019   A2GS 0.8 03/08/2019   BETS 0.7 03/08/2019   BETA2SER 0.3 01/03/2015   GAMS 0.5 03/08/2019   MSPIKE 0.2 (H) 03/08/2019   SPEI Comment 01/11/2019     Chemistry      Component Value Date/Time   NA 139 04/05/2019 1000   NA 137 04/08/2017 1141   NA 138 10/29/2015 1029   K 3.9 04/05/2019 1000   K 3.4 04/08/2017 1141   K 3.9 10/29/2015 1029   CL 106 04/05/2019 1000   CL 102 04/08/2017 1141   CO2 27 04/05/2019 1000   CO2 30 04/08/2017 1141   CO2 27 10/29/2015 1029   BUN 20 04/05/2019 1000   BUN 16 04/08/2017 1141   BUN 13.8 10/29/2015 1029   CREATININE 1.10 04/05/2019 1000   CREATININE 1.3 (H) 04/08/2017 1141   CREATININE 1.1 10/29/2015 1029      Component Value Date/Time   CALCIUM 9.4 04/05/2019 1000   CALCIUM 9.3 04/08/2017 1141   CALCIUM 9.3 10/29/2015 1029   ALKPHOS 40 04/05/2019 1000   ALKPHOS 66 04/08/2017 1141   ALKPHOS 52 10/29/2015 1029   AST 15 04/05/2019 1000   AST 20 10/29/2015 1029   ALT 11 04/05/2019 1000   ALT 21 04/08/2017 1141   ALT 14 10/29/2015 1029   BILITOT 0.7 04/05/2019 1000   BILITOT 0.75 10/29/2015 1029       Impression and Plan: Mr. Minerd is a very pleasant 85 yo African American gentleman with relapsedIgG kappamyeloma.He has a history of stem cell transplant back in 2006.   It is apparent that the daratumumab is helping quite a bit.   Even though his hemoglobin is below 10, I really do not think that we have to give him Aranesp today.  He continues his treatments every 2 weeks.  I will see him back in a month.     Volanda Napoleon, MD 11/11/202011:01 AM

## 2019-04-06 LAB — IGG, IGA, IGM
IgA: 18 mg/dL — ABNORMAL LOW (ref 61–437)
IgG (Immunoglobin G), Serum: 597 mg/dL — ABNORMAL LOW (ref 603–1613)
IgM (Immunoglobulin M), Srm: 27 mg/dL (ref 15–143)

## 2019-04-06 LAB — KAPPA/LAMBDA LIGHT CHAINS
Kappa free light chain: 11 mg/L (ref 3.3–19.4)
Kappa, lambda light chain ratio: 1.55 (ref 0.26–1.65)
Lambda free light chains: 7.1 mg/L (ref 5.7–26.3)

## 2019-04-11 LAB — IMMUNOFIXATION REFLEX, SERUM
IgA: 19 mg/dL — ABNORMAL LOW (ref 61–437)
IgG (Immunoglobin G), Serum: 586 mg/dL — ABNORMAL LOW (ref 603–1613)
IgM (Immunoglobulin M), Srm: 26 mg/dL (ref 15–143)

## 2019-04-11 LAB — PROTEIN ELECTROPHORESIS, SERUM, WITH REFLEX
A/G Ratio: 1.6 (ref 0.7–1.7)
Albumin ELP: 3.5 g/dL (ref 2.9–4.4)
Alpha-1-Globulin: 0.2 g/dL (ref 0.0–0.4)
Alpha-2-Globulin: 0.6 g/dL (ref 0.4–1.0)
Beta Globulin: 0.9 g/dL (ref 0.7–1.3)
Gamma Globulin: 0.5 g/dL (ref 0.4–1.8)
Globulin, Total: 2.2 g/dL (ref 2.2–3.9)
M-Spike, %: 0.1 g/dL — ABNORMAL HIGH
SPEP Interpretation: 0
Total Protein ELP: 5.7 g/dL — ABNORMAL LOW (ref 6.0–8.5)

## 2019-04-18 ENCOUNTER — Other Ambulatory Visit: Payer: Self-pay | Admitting: *Deleted

## 2019-04-18 DIAGNOSIS — C9000 Multiple myeloma not having achieved remission: Secondary | ICD-10-CM

## 2019-04-18 DIAGNOSIS — C9002 Multiple myeloma in relapse: Secondary | ICD-10-CM

## 2019-04-19 ENCOUNTER — Inpatient Hospital Stay: Payer: Medicare Other

## 2019-04-19 ENCOUNTER — Other Ambulatory Visit: Payer: Self-pay

## 2019-04-19 VITALS — BP 154/74 | HR 59 | Resp 20

## 2019-04-19 DIAGNOSIS — C9000 Multiple myeloma not having achieved remission: Secondary | ICD-10-CM

## 2019-04-19 DIAGNOSIS — N183 Chronic kidney disease, stage 3 unspecified: Secondary | ICD-10-CM

## 2019-04-19 DIAGNOSIS — Z5112 Encounter for antineoplastic immunotherapy: Secondary | ICD-10-CM | POA: Diagnosis not present

## 2019-04-19 DIAGNOSIS — D631 Anemia in chronic kidney disease: Secondary | ICD-10-CM

## 2019-04-19 DIAGNOSIS — Z79899 Other long term (current) drug therapy: Secondary | ICD-10-CM | POA: Diagnosis not present

## 2019-04-19 DIAGNOSIS — M899 Disorder of bone, unspecified: Secondary | ICD-10-CM

## 2019-04-19 DIAGNOSIS — C9002 Multiple myeloma in relapse: Secondary | ICD-10-CM | POA: Diagnosis not present

## 2019-04-19 LAB — CMP (CANCER CENTER ONLY)
ALT: 14 U/L (ref 0–44)
AST: 15 U/L (ref 15–41)
Albumin: 4.2 g/dL (ref 3.5–5.0)
Alkaline Phosphatase: 41 U/L (ref 38–126)
Anion gap: 8 (ref 5–15)
BUN: 31 mg/dL — ABNORMAL HIGH (ref 8–23)
CO2: 29 mmol/L (ref 22–32)
Calcium: 9.7 mg/dL (ref 8.9–10.3)
Chloride: 100 mmol/L (ref 98–111)
Creatinine: 1.34 mg/dL — ABNORMAL HIGH (ref 0.61–1.24)
GFR, Est AFR Am: 56 mL/min — ABNORMAL LOW (ref >60)
GFR, Estimated: 49 mL/min — ABNORMAL LOW (ref >60)
Glucose, Bld: 105 mg/dL — ABNORMAL HIGH (ref 70–99)
Potassium: 3.4 mmol/L — ABNORMAL LOW (ref 3.5–5.1)
Sodium: 137 mmol/L (ref 135–145)
Total Bilirubin: 0.8 mg/dL (ref 0.3–1.2)
Total Protein: 6.3 g/dL — ABNORMAL LOW (ref 6.5–8.1)

## 2019-04-19 LAB — CBC WITH DIFFERENTIAL (CANCER CENTER ONLY)
Abs Immature Granulocytes: 0.02 10*3/uL (ref 0.00–0.07)
Basophils Absolute: 0 10*3/uL (ref 0.0–0.1)
Basophils Relative: 1 %
Eosinophils Absolute: 0.1 10*3/uL (ref 0.0–0.5)
Eosinophils Relative: 2 %
HCT: 31.5 % — ABNORMAL LOW (ref 39.0–52.0)
Hemoglobin: 10.8 g/dL — ABNORMAL LOW (ref 13.0–17.0)
Immature Granulocytes: 0 %
Lymphocytes Relative: 25 %
Lymphs Abs: 1.6 10*3/uL (ref 0.7–4.0)
MCH: 35.1 pg — ABNORMAL HIGH (ref 26.0–34.0)
MCHC: 34.3 g/dL (ref 30.0–36.0)
MCV: 102.3 fL — ABNORMAL HIGH (ref 80.0–100.0)
Monocytes Absolute: 0.6 10*3/uL (ref 0.1–1.0)
Monocytes Relative: 10 %
Neutro Abs: 4.1 10*3/uL (ref 1.7–7.7)
Neutrophils Relative %: 62 %
Platelet Count: 175 10*3/uL (ref 150–400)
RBC: 3.08 MIL/uL — ABNORMAL LOW (ref 4.22–5.81)
RDW: 12.6 % (ref 11.5–15.5)
WBC Count: 6.6 10*3/uL (ref 4.0–10.5)
nRBC: 0 % (ref 0.0–0.2)

## 2019-04-19 MED ORDER — DIPHENHYDRAMINE HCL 25 MG PO CAPS
50.0000 mg | ORAL_CAPSULE | Freq: Once | ORAL | Status: AC
  Administered 2019-04-19: 50 mg via ORAL

## 2019-04-19 MED ORDER — ACETAMINOPHEN 325 MG PO TABS
650.0000 mg | ORAL_TABLET | Freq: Once | ORAL | Status: AC
  Administered 2019-04-19: 650 mg via ORAL

## 2019-04-19 MED ORDER — ACETAMINOPHEN 325 MG PO TABS
ORAL_TABLET | ORAL | Status: AC
  Filled 2019-04-19: qty 2

## 2019-04-19 MED ORDER — DEXAMETHASONE 4 MG PO TABS
20.0000 mg | ORAL_TABLET | Freq: Once | ORAL | Status: AC
  Administered 2019-04-19: 20 mg via ORAL

## 2019-04-19 MED ORDER — DEXAMETHASONE 4 MG PO TABS
ORAL_TABLET | ORAL | Status: AC
  Filled 2019-04-19: qty 5

## 2019-04-19 MED ORDER — HEPARIN SOD (PORK) LOCK FLUSH 100 UNIT/ML IV SOLN
500.0000 [IU] | Freq: Once | INTRAVENOUS | Status: AC | PRN
  Administered 2019-04-19: 500 [IU]
  Filled 2019-04-19: qty 5

## 2019-04-19 MED ORDER — SODIUM CHLORIDE 0.9 % IV SOLN
Freq: Once | INTRAVENOUS | Status: AC
  Administered 2019-04-19: 10:00:00 via INTRAVENOUS
  Filled 2019-04-19: qty 250

## 2019-04-19 MED ORDER — SODIUM CHLORIDE 0.9% FLUSH
10.0000 mL | INTRAVENOUS | Status: DC | PRN
  Administered 2019-04-19: 10 mL
  Filled 2019-04-19: qty 10

## 2019-04-19 MED ORDER — DARATUMUMAB-HYALURONIDASE-FIHJ 1800-30000 MG-UT/15ML ~~LOC~~ SOLN
1800.0000 mg | Freq: Once | SUBCUTANEOUS | Status: AC
  Administered 2019-04-19: 1800 mg via SUBCUTANEOUS
  Filled 2019-04-19: qty 15

## 2019-04-19 MED ORDER — ZOLEDRONIC ACID 4 MG/100ML IV SOLN
4.0000 mg | Freq: Once | INTRAVENOUS | Status: AC
  Administered 2019-04-19: 4 mg via INTRAVENOUS
  Filled 2019-04-19: qty 100

## 2019-04-19 MED ORDER — DIPHENHYDRAMINE HCL 25 MG PO CAPS
ORAL_CAPSULE | ORAL | Status: AC
  Filled 2019-04-19: qty 2

## 2019-04-19 NOTE — Patient Instructions (Signed)
Daratumumab injection What is this medicine? DARATUMUMAB (dar a toom ue mab) is a monoclonal antibody. It is used to treat multiple myeloma. This medicine may be used for other purposes; ask your health care provider or pharmacist if you have questions. COMMON BRAND NAME(S): DARZALEX What should I tell my health care provider before I take this medicine? They need to know if you have any of these conditions:  infection (especially a virus infection such as chickenpox, herpes, or hepatitis B virus)  lung or breathing disease  an unusual or allergic reaction to daratumumab, other medicines, foods, dyes, or preservatives  pregnant or trying to get pregnant  breast-feeding How should I use this medicine? This medicine is for infusion into a vein. It is given by a health care professional in a hospital or clinic setting. Talk to your pediatrician regarding the use of this medicine in children. Special care may be needed. Overdosage: If you think you have taken too much of this medicine contact a poison control center or emergency room at once. NOTE: This medicine is only for you. Do not share this medicine with others. What if I miss a dose? Keep appointments for follow-up doses as directed. It is important not to miss your dose. Call your doctor or health care professional if you are unable to keep an appointment. What may interact with this medicine? Interactions have not been studied. This list may not describe all possible interactions. Give your health care provider a list of all the medicines, herbs, non-prescription drugs, or dietary supplements you use. Also tell them if you smoke, drink alcohol, or use illegal drugs. Some items may interact with your medicine. What should I watch for while using this medicine? This drug may make you feel generally unwell. Report any side effects. Continue your course of treatment even though you feel ill unless your doctor tells you to stop. This  medicine can cause serious allergic reactions. To reduce your risk you may need to take medicine before treatment with this medicine. Take your medicine as directed. This medicine can affect the results of blood tests to match your blood type. These changes can last for up to 6 months after the final dose. Your healthcare provider will do blood tests to match your blood type before you start treatment. Tell all of your healthcare providers that you are being treated with this medicine before receiving a blood transfusion. This medicine can affect the results of some tests used to determine treatment response; extra tests may be needed to evaluate response. Do not become pregnant while taking this medicine or for 3 months after stopping it. Women should inform their doctor if they wish to become pregnant or think they might be pregnant. There is a potential for serious side effects to an unborn child. Talk to your health care professional or pharmacist for more information. What side effects may I notice from receiving this medicine? Side effects that you should report to your doctor or health care professional as soon as possible:  allergic reactions like skin rash, itching or hives, swelling of the face, lips, or tongue  breathing problems  chills  cough  dizziness  feeling faint or lightheaded  headache  low blood counts - this medicine may decrease the number of white blood cells, red blood cells and platelets. You may be at increased risk for infections and bleeding.  nausea, vomiting  shortness of breath  signs of decreased platelets or bleeding - bruising, pinpoint red spots on  the skin, black, tarry stools, blood in the urine  signs of decreased red blood cells - unusually weak or tired, feeling faint or lightheaded, falls  signs of infection - fever or chills, cough, sore throat, pain or difficulty passing urine  signs and symptoms of liver injury like dark yellow or brown  urine; general ill feeling or flu-like symptoms; light-colored stools; loss of appetite; right upper belly pain; unusually weak or tired; yellowing of the eyes or skin Side effects that usually do not require medical attention (report to your doctor or health care professional if they continue or are bothersome):  back pain  constipation  loss of appetite  diarrhea  joint pain  muscle cramps  pain, tingling, numbness in the hands or feet  swelling of the ankles, feet, hands  tiredness  trouble sleeping This list may not describe all possible side effects. Call your doctor for medical advice about side effects. You may report side effects to FDA at 1-800-FDA-1088. Where should I keep my medicine? Keep out of the reach of children. This drug is given in a hospital or clinic and will not be stored at home. NOTE: This sheet is a summary. It may not cover all possible information. If you have questions about this medicine, talk to your doctor, pharmacist, or health care provider.  2020 Elsevier/Gold Standard (2018-02-24 14:00:48)

## 2019-04-19 NOTE — Patient Instructions (Signed)

## 2019-04-19 NOTE — Progress Notes (Signed)
Reviewed labwork with Dr Ennever.  Ok to treat today. ?

## 2019-04-24 ENCOUNTER — Encounter: Payer: Self-pay | Admitting: Cardiology

## 2019-04-24 ENCOUNTER — Ambulatory Visit (INDEPENDENT_AMBULATORY_CARE_PROVIDER_SITE_OTHER): Payer: Medicare Other | Admitting: Cardiology

## 2019-04-24 ENCOUNTER — Other Ambulatory Visit: Payer: Self-pay

## 2019-04-24 VITALS — BP 154/84 | HR 68 | Ht 71.0 in | Wt 211.0 lb

## 2019-04-24 DIAGNOSIS — C9002 Multiple myeloma in relapse: Secondary | ICD-10-CM | POA: Diagnosis not present

## 2019-04-24 DIAGNOSIS — I10 Essential (primary) hypertension: Secondary | ICD-10-CM | POA: Diagnosis not present

## 2019-04-24 DIAGNOSIS — D631 Anemia in chronic kidney disease: Secondary | ICD-10-CM

## 2019-04-24 NOTE — Progress Notes (Signed)
Cardiology Office Note:    Date:  04/24/2019   ID:  Robert [name], DOB [birth year], MRN [id number]  PCP:  Robert Battles, MD  Cardiologist:  Robert Campus, MD    Referring MD: Robert Battles, MD   Chief Complaint  Patient presents with  . 6 week follow up  Doing well  History of Present Illness:    Robert [name] is a 85 y.o. male who was sent originally to [place] because of swelling of lower extremities.  His echocardiogram in May showed normal left ventricle ejection fraction, he did have elevated LVEDP.  Swelling of lower extremities was managed with diuretics successfully however lately we started having some difficulty with his blood pressure.  Likely right now things seems to be under control interestingly he clearly does have whitecoat hypertension.  He comes to doctor's office blood pressure is elevated at the same time he check his blood pressure at home and numbers that he showed me today systolic were between 825 systolic to a  053.  Therefore, I will continue present management.  Past Medical History:  Diagnosis Date  . Anemia of chronic renal failure, stage 3 (moderate) 10/06/2018  . Arthritis   . 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 Goals of care, counseling/discussion 05/12/2018  . Hyperlipidemia   . Hypertension   . Iron deficiency anemia 10/07/2018  . 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 Multiple myeloma ([place])    2006    Past Surgical History:  Procedure Laterality Date  . CATARACT EXTRACTION BILATERAL W/ ANTERIOR VITRECTOMY    . IR IMAGING GUIDED PORT INSERTION  05/30/2018  . LIMBAL STEM CELL TRANSPLANT      Current Medications: Current Meds  Medication Sig  . aspirin 81 MG tablet Take 81 mg by mouth daily.  . bimatoprost (LUMIGAN) 0.01 % SOLN Place 1 drop into both eyes at bedtime.   . Calcium Carbonate-Vit D-Min (CALTRATE 600+D PLUS PO) Take 1 tablet by mouth daily.  . carvedilol (COREG) 12.5 MG tablet Take 1 tablet (12.5 mg total) by mouth 2 (two)  times daily with a meal.  . Cholecalciferol (VITAMIN D3) 1000 UNITS CAPS Take 1,000 Units by mouth daily.   . cloNIDine (CATAPRES) 0.1 MG tablet Take 1 tablet (0.1 mg total) by mouth daily. Take an extra tablet as needed for blood pressure greater than 170/100 (x1 per day)  . cyanocobalamin (CVS VITAMIN B12) 2000 MCG tablet Take 2,000 mcg by mouth at bedtime.   . famciclovir (FAMVIR) 250 MG tablet TAKE 1 TABLET DAILY  . furosemide (LASIX) 20 MG tablet Take 2 tablets (40 mg total) by mouth daily.  . hydrochlorothiazide (HYDRODIURIL) 25 MG tablet 25 mg daily.  Marland Kitchen losartan (COZAAR) 100 MG tablet Take 100 mg by mouth daily.  . metolazone (ZAROXOLYN) 5 MG tablet Take 1 pill 1 hour prior to taking Lasix daily  . Multiple Vitamin (MULITIVITAMIN WITH MINERALS) TABS Take 1 tablet by mouth daily.  Vladimir Faster Glycol-Propyl Glycol (SYSTANE OP) Place 1 drop into both eyes daily as needed (dry eyes).   . potassium chloride SA (K-DUR,KLOR-CON) 20 MEQ tablet Take 20 mEq by mouth daily.  . pravastatin (PRAVACHOL) 20 MG tablet Take 20 mg by mouth at bedtime.  . Sennosides (HCA LAX-X PO) Take by mouth every three (3) days as needed.  . traMADol (ULTRAM) 50 MG tablet Take 1 tablet (50 mg total) by mouth every 6 (six) hours as needed.  . Zoledronic Acid (ZOMETA) 4 MG/100ML IVPB Inject 4 mg into the  vein every 4 (four) months. Administered by Dr. Jonette [name] at [facility] - last infusion mid July 2019     Allergies:   Patient has no known allergies.   Social History   Socioeconomic History  . Marital status: Married    Spouse name: Not on file  . Number of children: Not on file  . Years of education: Not on file  . Highest education level: Not on file  Occupational History  . Not on file  Social Needs  . Financial resource strain: Not on file  . Food insecurity    Worry: Not on file    Inability: Not on file  . Transportation needs    Medical: Not on file    Non-medical: Not on file  Tobacco  Use  . Smoking status: Former Smoker    Packs/day: 4.00    Years: 9.00    Pack years: 36.00    Types: Cigars, Cigarettes    Start date: 07/08/1979    Quit date: 07/07/1988    Years since quitting: 30.8  . Smokeless tobacco: Never Used  . Tobacco comment: quit 24 years ago  Substance and Sexual Activity  . Alcohol use: Yes    Alcohol/week: 6.0 standard drinks    Types: 6 Glasses of wine per week    Comment: very seldom  . Drug use: No  . Sexual activity: Not on file  Lifestyle  . Physical activity    Days per week: Not on file    Minutes per session: Not on file  . Stress: Not on file  Relationships  . Social Herbalist on phone: Not on file    Gets together: Not on file    Attends religious service: Not on file    Active member of club or organization: Not on file    Attends meetings of clubs or organizations: Not on file    Relationship status: Not on file  Other Topics Concern  . Not on file  Social History Narrative  . Not on file     Family History: The patient's family history includes Diabetes in his brother; Heart attack in his father; Heart disease in his father; Throat cancer in his mother. There is no history of Colon cancer. ROS:   Please see the history of present illness.    All 14 point review of systems negative except as described per history of present illness  EKGs/Labs/Other Studies Reviewed:      Recent Labs: 09/15/2018: B Natriuretic Peptide 76.7 04/19/2019: ALT 14; BUN 31; Creatinine 1.34; Hemoglobin 10.8; Platelet Count 175; Potassium 3.4; Sodium 137  Recent Lipid Panel No results found for: CHOL, TRIG, HDL, CHOLHDL, VLDL, LDLCALC, LDLDIRECT  Physical Exam:    VS:  BP (!) 154/84   Pulse 68   Ht '5\' 11"'  (1.803 m)   Wt 211 lb (95.7 kg)   SpO2 96%   BMI 29.43 kg/m     Wt Readings from Last 3 Encounters:  04/24/19 211 lb (95.7 kg)  04/05/19 211 lb (95.7 kg)  03/08/19 211 lb (95.7 kg)     GEN:  Well nourished, well developed  in no acute distress HEENT: Normal NECK: No JVD; No carotid bruits LYMPHATICS: No lymphadenopathy CARDIAC: RRR, no murmurs, no rubs, no gallops RESPIRATORY:  Clear to auscultation without rales, wheezing or rhonchi  ABDOMEN: Soft, non-tender, non-distended MUSCULOSKELETAL:  No edema; No deformity  SKIN: Warm and dry LOWER EXTREMITIES: no swelling NEUROLOGIC:  Alert and  oriented x 3 PSYCHIATRIC:  Normal affect   ASSESSMENT:    1. Essential hypertension   2. Erythropoietin deficiency anemia   3. Multiple myeloma in relapse [facility])    PLAN:    In order of problems listed above:  1. Essential hypertension seems to be under control right now.  He does have component of whitecoat hypertension.  We will continue present management 2. Anemia that be followed by oncology/hematology team 3. Multiple myeloma in relapse.  Excellent care like always by our oncology colleagues.   Medication Adjustments/Labs and Tests Ordered: Current medicines are reviewed at length with the patient today.  Concerns regarding medicines are outlined above.  No orders of the defined types were placed in this encounter.  Medication changes: No orders of the defined types were placed in this encounter.   Signed, Park Liter, MD, [facility] 04/24/2019 10:35 AM    [place]

## 2019-04-24 NOTE — Patient Instructions (Signed)
Medication Instructions:  Your physician recommends that you continue on your current medications as directed. Please refer to the Current Medication list given to you today.  *If you need a refill on your cardiac medications before your next appointment, please call your pharmacy*  Lab Work: None.  If you have labs (blood work) drawn today and your tests are completely normal, you will receive your results only by: . MyChart Message (if you have MyChart) OR . A paper copy in the mail If you have any lab test that is abnormal or we need to change your treatment, we will call you to review the results.  Testing/Procedures: None.   Follow-Up: At [facility], you and your health needs are our priority.  As part of our continuing mission to provide you with exceptional heart care, we have created designated Provider Care Teams.  These Care Teams include your primary Cardiologist (physician) and Advanced Practice Providers (APPs -  Physician Assistants and Nurse Practitioners) who all work together to provide you with the care you need, when you need it.  Your next appointment:   3 month(s)  The format for your next appointment:   In Person  Provider:   Robert Krasowski, MD  Other Instructions  

## 2019-05-03 ENCOUNTER — Encounter: Payer: Self-pay | Admitting: Hematology & Oncology

## 2019-05-03 ENCOUNTER — Inpatient Hospital Stay: Payer: Medicare Other

## 2019-05-03 ENCOUNTER — Inpatient Hospital Stay: Payer: Medicare Other | Attending: Hematology & Oncology | Admitting: Hematology & Oncology

## 2019-05-03 ENCOUNTER — Other Ambulatory Visit: Payer: Self-pay

## 2019-05-03 VITALS — BP 178/89 | HR 63 | Temp 97.8°F | Resp 20 | Ht 71.0 in | Wt 211.0 lb

## 2019-05-03 DIAGNOSIS — Z5112 Encounter for antineoplastic immunotherapy: Secondary | ICD-10-CM | POA: Insufficient documentation

## 2019-05-03 DIAGNOSIS — C9002 Multiple myeloma in relapse: Secondary | ICD-10-CM | POA: Insufficient documentation

## 2019-05-03 DIAGNOSIS — N183 Chronic kidney disease, stage 3 unspecified: Secondary | ICD-10-CM

## 2019-05-03 DIAGNOSIS — Z79899 Other long term (current) drug therapy: Secondary | ICD-10-CM | POA: Insufficient documentation

## 2019-05-03 DIAGNOSIS — D631 Anemia in chronic kidney disease: Secondary | ICD-10-CM

## 2019-05-03 DIAGNOSIS — C9000 Multiple myeloma not having achieved remission: Secondary | ICD-10-CM

## 2019-05-03 DIAGNOSIS — M899 Disorder of bone, unspecified: Secondary | ICD-10-CM

## 2019-05-03 LAB — CMP (CANCER CENTER ONLY)
ALT: 13 U/L (ref 0–44)
AST: 15 U/L (ref 15–41)
Albumin: 4.1 g/dL (ref 3.5–5.0)
Alkaline Phosphatase: 47 U/L (ref 38–126)
Anion gap: 6 (ref 5–15)
BUN: 20 mg/dL (ref 8–23)
CO2: 29 mmol/L (ref 22–32)
Calcium: 9.8 mg/dL (ref 8.9–10.3)
Chloride: 104 mmol/L (ref 98–111)
Creatinine: 1.19 mg/dL (ref 0.61–1.24)
GFR, Est AFR Am: >60 mL/min (ref >60)
GFR, Estimated: 56 mL/min — ABNORMAL LOW (ref >60)
Glucose, Bld: 97 mg/dL (ref 70–99)
Potassium: 3.6 mmol/L (ref 3.5–5.1)
Sodium: 139 mmol/L (ref 135–145)
Total Bilirubin: 0.6 mg/dL (ref 0.3–1.2)
Total Protein: 6.1 g/dL — ABNORMAL LOW (ref 6.5–8.1)

## 2019-05-03 LAB — CBC WITH DIFFERENTIAL (CANCER CENTER ONLY)
Abs Immature Granulocytes: 0.01 10*3/uL (ref 0.00–0.07)
Basophils Absolute: 0 10*3/uL (ref 0.0–0.1)
Basophils Relative: 0 %
Eosinophils Absolute: 0.1 10*3/uL (ref 0.0–0.5)
Eosinophils Relative: 2 %
HCT: 30.4 % — ABNORMAL LOW (ref 39.0–52.0)
Hemoglobin: 10.5 g/dL — ABNORMAL LOW (ref 13.0–17.0)
Immature Granulocytes: 0 %
Lymphocytes Relative: 24 %
Lymphs Abs: 1.5 10*3/uL (ref 0.7–4.0)
MCH: 35.4 pg — ABNORMAL HIGH (ref 26.0–34.0)
MCHC: 34.5 g/dL (ref 30.0–36.0)
MCV: 102.4 fL — ABNORMAL HIGH (ref 80.0–100.0)
Monocytes Absolute: 0.6 10*3/uL (ref 0.1–1.0)
Monocytes Relative: 9 %
Neutro Abs: 3.9 10*3/uL (ref 1.7–7.7)
Neutrophils Relative %: 65 %
Platelet Count: 182 10*3/uL (ref 150–400)
RBC: 2.97 MIL/uL — ABNORMAL LOW (ref 4.22–5.81)
RDW: 12.3 % (ref 11.5–15.5)
WBC Count: 6.1 10*3/uL (ref 4.0–10.5)
nRBC: 0 % (ref 0.0–0.2)

## 2019-05-03 MED ORDER — ACETAMINOPHEN 325 MG PO TABS
650.0000 mg | ORAL_TABLET | Freq: Once | ORAL | Status: AC
  Administered 2019-05-03: 650 mg via ORAL

## 2019-05-03 MED ORDER — DARATUMUMAB-HYALURONIDASE-FIHJ 1800-30000 MG-UT/15ML ~~LOC~~ SOLN
1800.0000 mg | Freq: Once | SUBCUTANEOUS | Status: AC
  Administered 2019-05-03: 1800 mg via SUBCUTANEOUS
  Filled 2019-05-03: qty 15

## 2019-05-03 MED ORDER — DIPHENHYDRAMINE HCL 25 MG PO CAPS
ORAL_CAPSULE | ORAL | Status: AC
  Filled 2019-05-03: qty 2

## 2019-05-03 MED ORDER — DEXAMETHASONE 4 MG PO TABS
20.0000 mg | ORAL_TABLET | Freq: Once | ORAL | Status: AC
  Administered 2019-05-03: 20 mg via ORAL

## 2019-05-03 MED ORDER — SODIUM CHLORIDE 0.9 % IV SOLN
Freq: Once | INTRAVENOUS | Status: AC
  Administered 2019-05-03: 10:00:00 via INTRAVENOUS
  Filled 2019-05-03: qty 250

## 2019-05-03 MED ORDER — ACETAMINOPHEN 325 MG PO TABS
ORAL_TABLET | ORAL | Status: AC
  Filled 2019-05-03: qty 2

## 2019-05-03 MED ORDER — HEPARIN SOD (PORK) LOCK FLUSH 100 UNIT/ML IV SOLN
500.0000 [IU] | Freq: Once | INTRAVENOUS | Status: AC | PRN
  Administered 2019-05-03: 11:00:00 500 [IU]
  Filled 2019-05-03: qty 5

## 2019-05-03 MED ORDER — DIPHENHYDRAMINE HCL 25 MG PO CAPS
50.0000 mg | ORAL_CAPSULE | Freq: Once | ORAL | Status: AC
  Administered 2019-05-03: 10:00:00 50 mg via ORAL

## 2019-05-03 MED ORDER — HEPARIN SOD (PORK) LOCK FLUSH 100 UNIT/ML IV SOLN
500.0000 [IU] | Freq: Once | INTRAVENOUS | Status: DC | PRN
  Filled 2019-05-03: qty 5

## 2019-05-03 MED ORDER — SODIUM CHLORIDE 0.9% FLUSH
10.0000 mL | INTRAVENOUS | Status: DC | PRN
  Administered 2019-05-03: 11:00:00 10 mL
  Filled 2019-05-03: qty 10

## 2019-05-03 MED ORDER — SODIUM CHLORIDE 0.9% FLUSH
10.0000 mL | INTRAVENOUS | Status: DC | PRN
  Administered 2019-05-03: 10 mL
  Filled 2019-05-03: qty 10

## 2019-05-03 NOTE — Patient Instructions (Signed)
Daratumumab injection What is this medicine? DARATUMUMAB (dar a toom ue mab) is a monoclonal antibody. It is used to treat multiple myeloma. This medicine may be used for other purposes; ask your health care provider or pharmacist if you have questions. COMMON BRAND NAME(S): DARZALEX What should I tell my health care provider before I take this medicine? They need to know if you have any of these conditions:  infection (especially a virus infection such as chickenpox, herpes, or hepatitis B virus)  lung or breathing disease  an unusual or allergic reaction to daratumumab, other medicines, foods, dyes, or preservatives  pregnant or trying to get pregnant  breast-feeding How should I use this medicine? This medicine is for infusion into a vein. It is given by a health care professional in a hospital or clinic setting. Talk to your pediatrician regarding the use of this medicine in children. Special care may be needed. Overdosage: If you think you have taken too much of this medicine contact a poison control center or emergency room at once. NOTE: This medicine is only for you. Do not share this medicine with others. What if I miss a dose? Keep appointments for follow-up doses as directed. It is important not to miss your dose. Call your doctor or health care professional if you are unable to keep an appointment. What may interact with this medicine? Interactions have not been studied. This list may not describe all possible interactions. Give your health care provider a list of all the medicines, herbs, non-prescription drugs, or dietary supplements you use. Also tell them if you smoke, drink alcohol, or use illegal drugs. Some items may interact with your medicine. What should I watch for while using this medicine? This drug may make you feel generally unwell. Report any side effects. Continue your course of treatment even though you feel ill unless your doctor tells you to stop. This  medicine can cause serious allergic reactions. To reduce your risk you may need to take medicine before treatment with this medicine. Take your medicine as directed. This medicine can affect the results of blood tests to match your blood type. These changes can last for up to 6 months after the final dose. Your healthcare provider will do blood tests to match your blood type before you start treatment. Tell all of your healthcare providers that you are being treated with this medicine before receiving a blood transfusion. This medicine can affect the results of some tests used to determine treatment response; extra tests may be needed to evaluate response. Do not become pregnant while taking this medicine or for 3 months after stopping it. Women should inform their doctor if they wish to become pregnant or think they might be pregnant. There is a potential for serious side effects to an unborn child. Talk to your health care professional or pharmacist for more information. What side effects may I notice from receiving this medicine? Side effects that you should report to your doctor or health care professional as soon as possible:  allergic reactions like skin rash, itching or hives, swelling of the face, lips, or tongue  breathing problems  chills  cough  dizziness  feeling faint or lightheaded  headache  low blood counts - this medicine may decrease the number of white blood cells, red blood cells and platelets. You may be at increased risk for infections and bleeding.  nausea, vomiting  shortness of breath  signs of decreased platelets or bleeding - bruising, pinpoint red spots on  the skin, black, tarry stools, blood in the urine  signs of decreased red blood cells - unusually weak or tired, feeling faint or lightheaded, falls  signs of infection - fever or chills, cough, sore throat, pain or difficulty passing urine  signs and symptoms of liver injury like dark yellow or brown  urine; general ill feeling or flu-like symptoms; light-colored stools; loss of appetite; right upper belly pain; unusually weak or tired; yellowing of the eyes or skin Side effects that usually do not require medical attention (report to your doctor or health care professional if they continue or are bothersome):  back pain  constipation  loss of appetite  diarrhea  joint pain  muscle cramps  pain, tingling, numbness in the hands or feet  swelling of the ankles, feet, hands  tiredness  trouble sleeping This list may not describe all possible side effects. Call your doctor for medical advice about side effects. You may report side effects to FDA at 1-800-FDA-1088. Where should I keep my medicine? Keep out of the reach of children. This drug is given in a hospital or clinic and will not be stored at home. NOTE: This sheet is a summary. It may not cover all possible information. If you have questions about this medicine, talk to your doctor, pharmacist, or health care provider.  2020 Elsevier/Gold Standard (2018-02-24 14:00:48)

## 2019-05-03 NOTE — Progress Notes (Signed)
Hematology and Oncology Follow Up Visit  Robert [name] [id number] 06/12/1933 85 y.o. 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 3 weeks for hemoglobin less than 10   Interim History:  Robert [name] is here today for follow-up.  So far, everything is going quite well for him.  He is really done a good job with the daratumumab.  Back in November, his monoclonal spike was down to 0.1 g/dL.  His IgG level was 572 mg/dL.  His kappa light chain was 1.1 mg/dL.  He had a wonderful Thanksgiving.  He was with his wife.  They ate quite a lot of good food.  Thankfully, did not affect his blood pressure.  His leg swelling has almost gone away now.  He is very happy about this.  He has had no problems with pain.  He had no change in bowel or bladder habits.  There has been no issues with cough or shortness of breath.  He has had no headache.    Currently, I would say performance status is ECOG 1.   Medications:  Allergies as of 05/03/2019   No Known Allergies     Medication List       Accurate as of May 03, 2019  9:26 AM. If you have any questions, ask your nurse or doctor.        aspirin 81 MG tablet Take 81 mg by mouth daily.   bimatoprost 0.01 % Soln Commonly known as: LUMIGAN Place 1 drop into both eyes at bedtime.   CALTRATE 600+D PLUS PO Take 1 tablet by mouth daily.   carvedilol 12.5 MG tablet Commonly known as: COREG Take 1 tablet (12.5 mg total) by mouth 2 (two) times daily with a meal.   cloNIDine 0.1 MG tablet Commonly known as: CATAPRES Take 1 tablet (0.1 mg  total) by mouth daily. Take an extra tablet as needed for blood pressure greater than 170/100 (x1 per day)   CVS VITAMIN B12 2000 MCG tablet Generic drug: cyanocobalamin Take 2,000 mcg by mouth at bedtime.   famciclovir 250 MG tablet Commonly known as: FAMVIR TAKE 1 TABLET DAILY   furosemide 20 MG tablet Commonly known as: LASIX Take 2 tablets (40 mg total) by mouth daily. What changed: when to take this   HCA LAX-X PO Take by mouth every three (3) days as needed.   hydrochlorothiazide 25 MG tablet Commonly known as: HYDRODIURIL 25 mg daily.   losartan 100 MG tablet Commonly known as: COZAAR Take 100 mg by mouth daily.   metolazone 5 MG tablet Commonly known as: ZAROXOLYN Take 1 pill 1 hour prior to taking Lasix daily   multivitamin with minerals Tabs tablet Take 1 tablet by mouth daily.   potassium chloride SA 20 MEQ tablet Commonly known as: KLOR-CON Take 20 mEq by mouth daily.   pravastatin 20 MG tablet Commonly known as: PRAVACHOL Take 20 mg by mouth at bedtime.   SYSTANE OP Place 1 drop into both eyes daily as needed (dry eyes).   traMADol 50 MG tablet Commonly known as: ULTRAM Take  1 tablet (50 mg total) by mouth every 6 (six) hours as needed.   Vitamin D3 25 MCG (1000 UT) Caps Take 1,000 Units by mouth daily.   Zometa 4 MG/100ML IVPB Generic drug: Zoledronic Acid Inject 4 mg into the vein every 4 (four) months. 05/03/2019 Administered by Dr. Jonette [name] at [name]       Allergies: No Known Allergies  Past Medical History, Surgical history, Social history, and Family History were reviewed and updated.  Review of Systems: Review of Systems  Constitutional: Negative.   HENT: Negative.   Eyes: Negative.   Respiratory: Negative.   Cardiovascular: Positive for leg swelling.  Gastrointestinal: Positive for heartburn.  Genitourinary: Negative.   Musculoskeletal: Positive for joint pain.  Skin: Negative.    Neurological: Negative.   Endo/Heme/Allergies: Negative.   Psychiatric/Behavioral: Negative.      Physical Exam:  vitals were not taken for this visit.   Wt Readings from Last 3 Encounters:  05/03/19 211 lb (95.7 kg)  04/24/19 211 lb (95.7 kg)  04/05/19 211 lb (95.7 kg)    Physical Exam Vitals signs reviewed.  HENT:     Head: Normocephalic and atraumatic.  Eyes:     Pupils: Pupils are equal, round, and reactive to light.  Neck:     Musculoskeletal: Normal range of motion.  Cardiovascular:     Rate and Rhythm: Normal rate and regular rhythm.     Heart sounds: Normal heart sounds.  Pulmonary:     Effort: Pulmonary effort is normal.     Breath sounds: Normal breath sounds.  Abdominal:     General: Bowel sounds are normal.     Palpations: Abdomen is soft.     Comments: Abdominal exam is obese but soft.  He has good bowel sounds.  There is no fluid wave.  There is no palpable liver or spleen tip.  Musculoskeletal: Normal range of motion.        General: No tenderness or deformity.     Comments: Extremities shows 1+ edema in his lower legs and feet.  This is mildly pitting.  He has decent range of motion of his joints.  He has decent strength.  Lymphadenopathy:     Cervical: No cervical adenopathy.  Skin:    General: Skin is warm and dry.     Findings: No erythema or rash.  Neurological:     Mental Status: He is alert and oriented to person, place, and time.  Psychiatric:        Behavior: Behavior normal.        Thought Content: Thought content normal.        Judgment: Judgment normal.      Lab Results  Component Value Date   WBC 6.1 05/03/2019   HGB 10.5 (L) 05/03/2019   HCT 30.4 (L) 05/03/2019   MCV 102.4 (H) 05/03/2019   PLT 182 05/03/2019   Lab Results  Component Value Date   FERRITIN 1,127 (H) 04/05/2019   IRON 106 04/05/2019   TIBC 259 04/05/2019   UIBC 152 04/05/2019   IRONPCTSAT 41 04/05/2019   Lab Results  Component Value Date   RETICCTPCT 2.2  04/05/2019   RBC 2.97 (L) 05/03/2019   Lab Results  Component Value Date   KPAFRELGTCHN 11.0 04/05/2019   LAMBDASER 7.1 04/05/2019   KAPLAMBRATIO 1.55 04/05/2019   Lab Results  Component Value Date   IGGSERUM 597 (L) 04/05/2019   IGGSERUM 586 (L) 04/05/2019   IGA  18 (L) 04/05/2019   IGA 19 (L) 04/05/2019   IGMSERUM 27 04/05/2019   IGMSERUM 26 04/05/2019   Lab Results  Component Value Date   TOTALPROTELP 5.7 (L) 04/05/2019   ALBUMINELP 3.5 04/05/2019   A1GS 0.2 04/05/2019   A2GS 0.6 04/05/2019   BETS 0.9 04/05/2019   BETA2SER 0.3 01/03/2015   GAMS 0.5 04/05/2019   MSPIKE 0.1 (H) 04/05/2019   SPEI Comment 01/11/2019     Chemistry      Component Value Date/Time   NA 137 04/19/2019 0940   NA 137 04/08/2017 1141   NA 138 10/29/2015 1029   K 3.4 (L) 04/19/2019 0940   K 3.4 04/08/2017 1141   K 3.9 10/29/2015 1029   CL 100 04/19/2019 0940   CL 102 04/08/2017 1141   CO2 29 04/19/2019 0940   CO2 30 04/08/2017 1141   CO2 27 10/29/2015 1029   BUN 31 (H) 04/19/2019 0940   BUN 16 04/08/2017 1141   BUN 13.8 10/29/2015 1029   CREATININE 1.34 (H) 04/19/2019 0940   CREATININE 1.3 (H) 04/08/2017 1141   CREATININE 1.1 10/29/2015 1029      Component Value Date/Time   CALCIUM 9.7 04/19/2019 0940   CALCIUM 9.3 04/08/2017 1141   CALCIUM 9.3 10/29/2015 1029   ALKPHOS 41 04/19/2019 0940   ALKPHOS 66 04/08/2017 1141   ALKPHOS 52 10/29/2015 1029   AST 15 04/19/2019 0940   AST 20 10/29/2015 1029   ALT 14 04/19/2019 0940   ALT 21 04/08/2017 1141   ALT 14 10/29/2015 1029   BILITOT 0.8 04/19/2019 0940   BILITOT 0.75 10/29/2015 1029       Impression and Plan: Mr. Randles is a very pleasant 85 yo African American gentleman with relapsedIgG kappamyeloma.He has a history of stem cell transplant back in 2006.  It is apparent that the daratumumab is helping quite a bit.  We will now move his daratumumab to monthly treatments.  Even though his hemoglobin is below 10, I really do  not think that we have to give him Aranesp today.  I will see him back in 1 month.  This really has been a good year for him.   Volanda Napoleon, MD 12/9/20209:26 AM

## 2019-05-03 NOTE — Patient Instructions (Signed)

## 2019-05-04 LAB — IGG, IGA, IGM
IgA: 18 mg/dL — ABNORMAL LOW (ref 61–437)
IgG (Immunoglobin G), Serum: 579 mg/dL — ABNORMAL LOW (ref 603–1613)
IgM (Immunoglobulin M), Srm: 27 mg/dL (ref 15–143)

## 2019-05-04 LAB — KAPPA/LAMBDA LIGHT CHAINS
Kappa free light chain: 9.2 mg/L (ref 3.3–19.4)
Kappa, lambda light chain ratio: 1.18 (ref 0.26–1.65)
Lambda free light chains: 7.8 mg/L (ref 5.7–26.3)

## 2019-05-09 LAB — PROTEIN ELECTROPHORESIS, SERUM, WITH REFLEX
A/G Ratio: 1.4 (ref 0.7–1.7)
Albumin ELP: 3.4 g/dL (ref 2.9–4.4)
Alpha-1-Globulin: 0.2 g/dL (ref 0.0–0.4)
Alpha-2-Globulin: 0.8 g/dL (ref 0.4–1.0)
Beta Globulin: 0.9 g/dL (ref 0.7–1.3)
Gamma Globulin: 0.5 g/dL (ref 0.4–1.8)
Globulin, Total: 2.4 g/dL (ref 2.2–3.9)
M-Spike, %: 0.1 g/dL — ABNORMAL HIGH
SPEP Interpretation: 0
Total Protein ELP: 5.8 g/dL — ABNORMAL LOW (ref 6.0–8.5)

## 2019-05-09 LAB — IMMUNOFIXATION REFLEX, SERUM
IgA: 18 mg/dL — ABNORMAL LOW (ref 61–437)
IgG (Immunoglobin G), Serum: 562 mg/dL — ABNORMAL LOW (ref 603–1613)
IgM (Immunoglobulin M), Srm: 29 mg/dL (ref 15–143)

## 2019-05-31 ENCOUNTER — Inpatient Hospital Stay: Payer: Medicare Other

## 2019-05-31 ENCOUNTER — Inpatient Hospital Stay: Payer: Medicare Other | Attending: Hematology & Oncology | Admitting: Hematology & Oncology

## 2019-05-31 ENCOUNTER — Encounter: Payer: Self-pay | Admitting: Hematology & Oncology

## 2019-05-31 ENCOUNTER — Other Ambulatory Visit: Payer: Self-pay

## 2019-05-31 ENCOUNTER — Telehealth: Payer: Self-pay | Admitting: Hematology & Oncology

## 2019-05-31 VITALS — BP 184/96 | HR 66 | Temp 97.7°F | Resp 19 | Ht 71.0 in | Wt 211.0 lb

## 2019-05-31 VITALS — BP 192/80

## 2019-05-31 DIAGNOSIS — C9002 Multiple myeloma in relapse: Secondary | ICD-10-CM

## 2019-05-31 DIAGNOSIS — Z5112 Encounter for antineoplastic immunotherapy: Secondary | ICD-10-CM | POA: Diagnosis not present

## 2019-05-31 DIAGNOSIS — I10 Essential (primary) hypertension: Secondary | ICD-10-CM

## 2019-05-31 DIAGNOSIS — D469 Myelodysplastic syndrome, unspecified: Secondary | ICD-10-CM | POA: Diagnosis not present

## 2019-05-31 DIAGNOSIS — D630 Anemia in neoplastic disease: Secondary | ICD-10-CM | POA: Diagnosis not present

## 2019-05-31 DIAGNOSIS — Z79899 Other long term (current) drug therapy: Secondary | ICD-10-CM | POA: Insufficient documentation

## 2019-05-31 LAB — CMP (CANCER CENTER ONLY)
ALT: 12 U/L (ref 0–44)
AST: 16 U/L (ref 15–41)
Albumin: 4.1 g/dL (ref 3.5–5.0)
Alkaline Phosphatase: 48 U/L (ref 38–126)
Anion gap: 8 (ref 5–15)
BUN: 25 mg/dL — ABNORMAL HIGH (ref 8–23)
CO2: 30 mmol/L (ref 22–32)
Calcium: 10.2 mg/dL (ref 8.9–10.3)
Chloride: 98 mmol/L (ref 98–111)
Creatinine: 1.27 mg/dL — ABNORMAL HIGH (ref 0.61–1.24)
GFR, Est AFR Am: >60 mL/min (ref >60)
GFR, Estimated: 52 mL/min — ABNORMAL LOW (ref >60)
Glucose, Bld: 122 mg/dL — ABNORMAL HIGH (ref 70–99)
Potassium: 3.4 mmol/L — ABNORMAL LOW (ref 3.5–5.1)
Sodium: 136 mmol/L (ref 135–145)
Total Bilirubin: 0.8 mg/dL (ref 0.3–1.2)
Total Protein: 6.2 g/dL — ABNORMAL LOW (ref 6.5–8.1)

## 2019-05-31 LAB — CBC WITH DIFFERENTIAL (CANCER CENTER ONLY)
Abs Immature Granulocytes: 0.02 10*3/uL (ref 0.00–0.07)
Basophils Absolute: 0 10*3/uL (ref 0.0–0.1)
Basophils Relative: 0 %
Eosinophils Absolute: 0.1 10*3/uL (ref 0.0–0.5)
Eosinophils Relative: 2 %
HCT: 34.6 % — ABNORMAL LOW (ref 39.0–52.0)
Hemoglobin: 11.8 g/dL — ABNORMAL LOW (ref 13.0–17.0)
Immature Granulocytes: 0 %
Lymphocytes Relative: 21 %
Lymphs Abs: 1.7 10*3/uL (ref 0.7–4.0)
MCH: 34 pg (ref 26.0–34.0)
MCHC: 34.1 g/dL (ref 30.0–36.0)
MCV: 99.7 fL (ref 80.0–100.0)
Monocytes Absolute: 0.7 10*3/uL (ref 0.1–1.0)
Monocytes Relative: 9 %
Neutro Abs: 5.4 10*3/uL (ref 1.7–7.7)
Neutrophils Relative %: 68 %
Platelet Count: 205 10*3/uL (ref 150–400)
RBC: 3.47 MIL/uL — ABNORMAL LOW (ref 4.22–5.81)
RDW: 11.9 % (ref 11.5–15.5)
WBC Count: 8.1 10*3/uL (ref 4.0–10.5)
nRBC: 0 % (ref 0.0–0.2)

## 2019-05-31 MED ORDER — ACETAMINOPHEN 325 MG PO TABS
650.0000 mg | ORAL_TABLET | Freq: Once | ORAL | Status: AC
  Administered 2019-05-31: 650 mg via ORAL

## 2019-05-31 MED ORDER — SODIUM CHLORIDE 0.9% FLUSH
10.0000 mL | INTRAVENOUS | Status: DC | PRN
  Administered 2019-05-31: 13:00:00 10 mL
  Filled 2019-05-31: qty 10

## 2019-05-31 MED ORDER — HEPARIN SOD (PORK) LOCK FLUSH 100 UNIT/ML IV SOLN
500.0000 [IU] | Freq: Once | INTRAVENOUS | Status: AC | PRN
  Administered 2019-05-31: 13:00:00 500 [IU]
  Filled 2019-05-31: qty 5

## 2019-05-31 MED ORDER — ACETAMINOPHEN 325 MG PO TABS
ORAL_TABLET | ORAL | Status: AC
  Filled 2019-05-31: qty 2

## 2019-05-31 MED ORDER — DEXAMETHASONE 4 MG PO TABS
20.0000 mg | ORAL_TABLET | Freq: Once | ORAL | Status: AC
  Administered 2019-05-31: 11:00:00 20 mg via ORAL

## 2019-05-31 MED ORDER — DIPHENHYDRAMINE HCL 25 MG PO CAPS
ORAL_CAPSULE | ORAL | Status: AC
  Filled 2019-05-31: qty 2

## 2019-05-31 MED ORDER — POTASSIUM CHLORIDE CRYS ER 20 MEQ PO TBCR
20.0000 meq | EXTENDED_RELEASE_TABLET | Freq: Once | ORAL | Status: AC
  Administered 2019-05-31: 12:00:00 20 meq via ORAL
  Filled 2019-05-31: qty 1

## 2019-05-31 MED ORDER — DIPHENHYDRAMINE HCL 25 MG PO CAPS
50.0000 mg | ORAL_CAPSULE | Freq: Once | ORAL | Status: AC
  Administered 2019-05-31: 11:00:00 50 mg via ORAL

## 2019-05-31 MED ORDER — DARATUMUMAB-HYALURONIDASE-FIHJ 1800-30000 MG-UT/15ML ~~LOC~~ SOLN
1800.0000 mg | Freq: Once | SUBCUTANEOUS | Status: AC
  Administered 2019-05-31: 1800 mg via SUBCUTANEOUS
  Filled 2019-05-31: qty 15

## 2019-05-31 MED ORDER — SODIUM CHLORIDE 0.9 % IV SOLN
Freq: Once | INTRAVENOUS | Status: AC
  Filled 2019-05-31: qty 250

## 2019-05-31 NOTE — Progress Notes (Signed)
Pt elevated BP reviewed with MD, proceed with treatment

## 2019-05-31 NOTE — Telephone Encounter (Signed)
Appointments were already scheduled/ per 1/6 los

## 2019-05-31 NOTE — Progress Notes (Signed)
Hematology and Oncology Follow Up Visit  Robert [name] [id number] [birth year] 86 y.o. 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 3 weeks for hemoglobin less than 10   Interim History:  Robert [name] is here today for follow-up.  So far, he has been doing quite well.  He had a nice Christmas and New Year's holiday.  His wife is very cautious with respect to what he eats.  He has responded very nicely to the daratumumab.  We last checked his M spike back in December it was down to 0.1 g/dL.  His IgG level was 570 mg/dL.  The kappa light chain was 0.9 mg/dL.  His blood pressure is always high but for him, and it is fairly well controlled.  He has had no issues with nausea or vomiting.  He has had no rashes.  He has had no leg swelling.  He has had no bleeding.  Currently, I would say performance status is ECOG 1.   Medications:  Allergies as of 05/31/2019   No Known Allergies     Medication List       Accurate as of May 31, 2019  1:40 PM. If you have any questions, ask your nurse or doctor.        aspirin 81 MG tablet Take 81 mg by mouth daily.   bimatoprost 0.01 % Soln Commonly known as: LUMIGAN Place 1 drop into both eyes at bedtime.   CALTRATE 600+D PLUS PO Take 1 tablet by mouth daily.   carvedilol 12.5 MG tablet Commonly known as: COREG Take 1 tablet (12.5 mg total) by mouth 2 (two) times daily with a meal.   cloNIDine 0.1 MG tablet Commonly known as: CATAPRES Take 1 tablet (0.1 mg total) by mouth daily. Take an extra tablet as needed for blood pressure  greater than 170/100 (x1 per day)   CVS VITAMIN B12 2000 MCG tablet Generic drug: cyanocobalamin Take 2,000 mcg by mouth at bedtime.   famciclovir 250 MG tablet Commonly known as: FAMVIR TAKE 1 TABLET DAILY   furosemide 20 MG tablet Commonly known as: LASIX Take 2 tablets (40 mg total) by mouth daily. What changed: when to take this   HCA LAX-X PO Take by mouth every three (3) days as needed.   hydrochlorothiazide 25 MG tablet Commonly known as: HYDRODIURIL 25 mg daily.   losartan 100 MG tablet Commonly known as: COZAAR Take 100 mg by mouth daily.   metolazone 5 MG tablet Commonly known as: ZAROXOLYN Take 1 pill 1 hour prior to taking Lasix daily   multivitamin with minerals Tabs tablet Take 1 tablet by mouth daily.   potassium chloride SA 20 MEQ tablet Commonly known as: KLOR-CON Take 20 mEq by mouth daily.   pravastatin 20 MG tablet Commonly known as: PRAVACHOL Take 20 mg by mouth at bedtime.   SYSTANE OP Place 1 drop into both eyes daily as needed (dry eyes).   traMADol 50 MG tablet Commonly known as: ULTRAM Take 1 tablet (50 mg total) by mouth every 6 (six) hours as needed.  Vitamin D3 25 MCG (1000 UT) Caps Take 1,000 Units by mouth daily.   Zometa 4 MG/100ML IVPB Generic drug: Zoledronic Acid Inject 4 mg into the vein every 4 (four) months. 05/03/2019 Administered by Dr. Jonette Eva at [facility] - last infusion 04/19/19       Allergies: No Known Allergies  Past Medical History, Surgical history, Social history, and Family History were reviewed and updated.  Review of Systems: Review of Systems  Constitutional: Negative.   HENT: Negative.   Eyes: Negative.   Respiratory: Negative.   Cardiovascular: Positive for leg swelling.  Gastrointestinal: Positive for heartburn.  Genitourinary: Negative.   Musculoskeletal: Positive for joint pain.  Skin: Negative.   Neurological: Negative.   Endo/Heme/Allergies: Negative.     Psychiatric/Behavioral: Negative.      Physical Exam:  height is 5\' 11"  (1.803 m) and weight is 211 lb (95.7 kg). His temporal temperature is 97.7 F (36.5 C). His blood pressure is 184/96 (abnormal) and his pulse is 66. His respiration is 19 and oxygen saturation is 100%.   Wt Readings from Last 3 Encounters:  05/31/19 211 lb (95.7 kg)  05/03/19 211 lb (95.7 kg)  04/24/19 211 lb (95.7 kg)    Physical Exam Vitals reviewed.  HENT:     Head: Normocephalic and atraumatic.  Eyes:     Pupils: Pupils are equal, round, and reactive to light.  Cardiovascular:     Rate and Rhythm: Normal rate and regular rhythm.     Heart sounds: Normal heart sounds.  Pulmonary:     Effort: Pulmonary effort is normal.     Breath sounds: Normal breath sounds.  Abdominal:     General: Bowel sounds are normal.     Palpations: Abdomen is soft.     Comments: Abdominal exam is obese but soft.  He has good bowel sounds.  There is no fluid wave.  There is no palpable liver or spleen tip.  Musculoskeletal:        General: No tenderness or deformity. Normal range of motion.     Cervical back: Normal range of motion.     Comments: Extremities shows 1+ edema in his lower legs and feet.  This is mildly pitting.  He has decent range of motion of his joints.  He has decent strength.  Lymphadenopathy:     Cervical: No cervical adenopathy.  Skin:    General: Skin is warm and dry.     Findings: No erythema or rash.  Neurological:     Mental Status: He is alert and oriented to person, place, and time.  Psychiatric:        Behavior: Behavior normal.        Thought Content: Thought content normal.        Judgment: Judgment normal.      Lab Results  Component Value Date   WBC 8.1 05/31/2019   HGB 11.8 (L) 05/31/2019   HCT 34.6 (L) 05/31/2019   MCV 99.7 05/31/2019   PLT 205 05/31/2019   Lab Results  Component Value Date   FERRITIN 1,127 (H) 04/05/2019   IRON 106 04/05/2019   TIBC 259 04/05/2019   UIBC  152 04/05/2019   IRONPCTSAT 41 04/05/2019   Lab Results  Component Value Date   RETICCTPCT 2.2 04/05/2019   RBC 3.47 (L) 05/31/2019   Lab Results  Component Value Date   KPAFRELGTCHN 9.2 05/03/2019   LAMBDASER 7.8 05/03/2019   KAPLAMBRATIO 1.18 05/03/2019   Lab Results  Component  Value Date   IGGSERUM 579 (L) 05/03/2019   IGGSERUM 562 (L) 05/03/2019   IGA 18 (L) 05/03/2019   IGA 18 (L) 05/03/2019   IGMSERUM 27 05/03/2019   IGMSERUM 29 05/03/2019   Lab Results  Component Value Date   TOTALPROTELP 5.8 (L) 05/03/2019   ALBUMINELP 3.4 05/03/2019   A1GS 0.2 05/03/2019   A2GS 0.8 05/03/2019   BETS 0.9 05/03/2019   BETA2SER 0.3 01/03/2015   GAMS 0.5 05/03/2019   MSPIKE 0.1 (H) 05/03/2019   SPEI Comment 01/11/2019     Chemistry      Component Value Date/Time   NA 136 05/31/2019 0900   NA 137 04/08/2017 1141   NA 138 10/29/2015 1029   K 3.4 (L) 05/31/2019 0900   K 3.4 04/08/2017 1141   K 3.9 10/29/2015 1029   CL 98 05/31/2019 0900   CL 102 04/08/2017 1141   CO2 30 05/31/2019 0900   CO2 30 04/08/2017 1141   CO2 27 10/29/2015 1029   BUN 25 (H) 05/31/2019 0900   BUN 16 04/08/2017 1141   BUN 13.8 10/29/2015 1029   CREATININE 1.27 (H) 05/31/2019 0900   CREATININE 1.3 (H) 04/08/2017 1141   CREATININE 1.1 10/29/2015 1029      Component Value Date/Time   CALCIUM 10.2 05/31/2019 0900   CALCIUM 9.3 04/08/2017 1141   CALCIUM 9.3 10/29/2015 1029   ALKPHOS 48 05/31/2019 0900   ALKPHOS 66 04/08/2017 1141   ALKPHOS 52 10/29/2015 1029   AST 16 05/31/2019 0900   AST 20 10/29/2015 1029   ALT 12 05/31/2019 0900   ALT 21 04/08/2017 1141   ALT 14 10/29/2015 1029   BILITOT 0.8 05/31/2019 0900   BILITOT 0.75 10/29/2015 1029       Impression and Plan: Robert [name] is a very pleasant 86 yo African American gentleman with relapsedIgG kappamyeloma.He has a history of stem cell transplant back in 2006.  It is apparent that the daratumumab is helping quite a bit.  Hopefully,  the monthly daratumumab will still keep his myeloma at a very low level.  His quality life is doing so much better right now.  We will get him back in 1 month.   Volanda Napoleon, MD 1/6/20211:40 PM

## 2019-05-31 NOTE — Patient Instructions (Signed)

## 2019-05-31 NOTE — Patient Instructions (Signed)
Daratumumab injection What is this medicine? DARATUMUMAB (dar a toom ue mab) is a monoclonal antibody. It is used to treat multiple myeloma. This medicine may be used for other purposes; ask your health care provider or pharmacist if you have questions. COMMON BRAND NAME(S): DARZALEX What should I tell my health care provider before I take this medicine? They need to know if you have any of these conditions:  infection (especially a virus infection such as chickenpox, herpes, or hepatitis B virus)  lung or breathing disease  an unusual or allergic reaction to daratumumab, other medicines, foods, dyes, or preservatives  pregnant or trying to get pregnant  breast-feeding How should I use this medicine? This medicine is for infusion into a vein. It is given by a health care professional in a hospital or clinic setting. Talk to your pediatrician regarding the use of this medicine in children. Special care may be needed. Overdosage: If you think you have taken too much of this medicine contact a poison control center or emergency room at once. NOTE: This medicine is only for you. Do not share this medicine with others. What if I miss a dose? Keep appointments for follow-up doses as directed. It is important not to miss your dose. Call your doctor or health care professional if you are unable to keep an appointment. What may interact with this medicine? Interactions have not been studied. This list may not describe all possible interactions. Give your health care provider a list of all the medicines, herbs, non-prescription drugs, or dietary supplements you use. Also tell them if you smoke, drink alcohol, or use illegal drugs. Some items may interact with your medicine. What should I watch for while using this medicine? This drug may make you feel generally unwell. Report any side effects. Continue your course of treatment even though you feel ill unless your doctor tells you to stop. This  medicine can cause serious allergic reactions. To reduce your risk you may need to take medicine before treatment with this medicine. Take your medicine as directed. This medicine can affect the results of blood tests to match your blood type. These changes can last for up to 6 months after the final dose. Your healthcare provider will do blood tests to match your blood type before you start treatment. Tell all of your healthcare providers that you are being treated with this medicine before receiving a blood transfusion. This medicine can affect the results of some tests used to determine treatment response; extra tests may be needed to evaluate response. Do not become pregnant while taking this medicine or for 3 months after stopping it. Women should inform their doctor if they wish to become pregnant or think they might be pregnant. There is a potential for serious side effects to an unborn child. Talk to your health care professional or pharmacist for more information. What side effects may I notice from receiving this medicine? Side effects that you should report to your doctor or health care professional as soon as possible:  allergic reactions like skin rash, itching or hives, swelling of the face, lips, or tongue  breathing problems  chills  cough  dizziness  feeling faint or lightheaded  headache  low blood counts - this medicine may decrease the number of white blood cells, red blood cells and platelets. You may be at increased risk for infections and bleeding.  nausea, vomiting  shortness of breath  signs of decreased platelets or bleeding - bruising, pinpoint red spots on  the skin, black, tarry stools, blood in the urine  signs of decreased red blood cells - unusually weak or tired, feeling faint or lightheaded, falls  signs of infection - fever or chills, cough, sore throat, pain or difficulty passing urine  signs and symptoms of liver injury like dark yellow or brown  urine; general ill feeling or flu-like symptoms; light-colored stools; loss of appetite; right upper belly pain; unusually weak or tired; yellowing of the eyes or skin Side effects that usually do not require medical attention (report to your doctor or health care professional if they continue or are bothersome):  back pain  constipation  loss of appetite  diarrhea  joint pain  muscle cramps  pain, tingling, numbness in the hands or feet  swelling of the ankles, feet, hands  tiredness  trouble sleeping This list may not describe all possible side effects. Call your doctor for medical advice about side effects. You may report side effects to FDA at 1-800-FDA-1088. Where should I keep my medicine? Keep out of the reach of children. This drug is given in a hospital or clinic and will not be stored at home. NOTE: This sheet is a summary. It may not cover all possible information. If you have questions about this medicine, talk to your doctor, pharmacist, or health care provider.  2020 Elsevier/Gold Standard (2018-02-24 14:00:48)

## 2019-06-01 LAB — KAPPA/LAMBDA LIGHT CHAINS
Kappa free light chain: 11.7 mg/L (ref 3.3–19.4)
Kappa, lambda light chain ratio: 1.54 (ref 0.26–1.65)
Lambda free light chains: 7.6 mg/L (ref 5.7–26.3)

## 2019-06-01 LAB — IGG, IGA, IGM
IgA: 20 mg/dL — ABNORMAL LOW (ref 61–437)
IgG (Immunoglobin G), Serum: 571 mg/dL — ABNORMAL LOW (ref 603–1613)
IgM (Immunoglobulin M), Srm: 34 mg/dL (ref 15–143)

## 2019-06-05 LAB — PROTEIN ELECTROPHORESIS, SERUM, WITH REFLEX
A/G Ratio: 1.8 — ABNORMAL HIGH (ref 0.7–1.7)
Albumin ELP: 3.9 g/dL (ref 2.9–4.4)
Alpha-1-Globulin: 0.1 g/dL (ref 0.0–0.4)
Alpha-2-Globulin: 0.8 g/dL (ref 0.4–1.0)
Beta Globulin: 0.8 g/dL (ref 0.7–1.3)
Gamma Globulin: 0.4 g/dL (ref 0.4–1.8)
Globulin, Total: 2.2 g/dL (ref 2.2–3.9)
M-Spike, %: 0.1 g/dL — ABNORMAL HIGH
SPEP Interpretation: 0
Total Protein ELP: 6.1 g/dL (ref 6.0–8.5)

## 2019-06-05 LAB — IMMUNOFIXATION REFLEX, SERUM
IgA: 21 mg/dL — ABNORMAL LOW (ref 61–437)
IgG (Immunoglobin G), Serum: 658 mg/dL (ref 603–1613)
IgM (Immunoglobulin M), Srm: 33 mg/dL (ref 15–143)

## 2019-06-28 ENCOUNTER — Encounter: Payer: Self-pay | Admitting: Hematology & Oncology

## 2019-06-28 ENCOUNTER — Other Ambulatory Visit: Payer: Self-pay

## 2019-06-28 ENCOUNTER — Inpatient Hospital Stay: Payer: Medicare Other | Attending: Hematology & Oncology | Admitting: Hematology & Oncology

## 2019-06-28 ENCOUNTER — Inpatient Hospital Stay: Payer: Medicare Other

## 2019-06-28 VITALS — BP 157/95 | Wt 214.0 lb

## 2019-06-28 VITALS — BP 170/87 | HR 54 | Resp 18

## 2019-06-28 VITALS — BP 187/92 | HR 66 | Temp 97.9°F | Resp 17 | Ht 71.0 in | Wt 214.1 lb

## 2019-06-28 DIAGNOSIS — D630 Anemia in neoplastic disease: Secondary | ICD-10-CM | POA: Diagnosis not present

## 2019-06-28 DIAGNOSIS — C9000 Multiple myeloma not having achieved remission: Secondary | ICD-10-CM

## 2019-06-28 DIAGNOSIS — I10 Essential (primary) hypertension: Secondary | ICD-10-CM

## 2019-06-28 DIAGNOSIS — Z5112 Encounter for antineoplastic immunotherapy: Secondary | ICD-10-CM | POA: Diagnosis not present

## 2019-06-28 DIAGNOSIS — Z79899 Other long term (current) drug therapy: Secondary | ICD-10-CM | POA: Insufficient documentation

## 2019-06-28 DIAGNOSIS — C9002 Multiple myeloma in relapse: Secondary | ICD-10-CM

## 2019-06-28 DIAGNOSIS — Z95828 Presence of other vascular implants and grafts: Secondary | ICD-10-CM

## 2019-06-28 LAB — CBC WITH DIFFERENTIAL (CANCER CENTER ONLY)
Abs Immature Granulocytes: 0.01 10*3/uL (ref 0.00–0.07)
Basophils Absolute: 0 10*3/uL (ref 0.0–0.1)
Basophils Relative: 0 %
Eosinophils Absolute: 0.1 10*3/uL (ref 0.0–0.5)
Eosinophils Relative: 3 %
HCT: 33.5 % — ABNORMAL LOW (ref 39.0–52.0)
Hemoglobin: 11.5 g/dL — ABNORMAL LOW (ref 13.0–17.0)
Immature Granulocytes: 0 %
Lymphocytes Relative: 30 %
Lymphs Abs: 1.6 10*3/uL (ref 0.7–4.0)
MCH: 33.7 pg (ref 26.0–34.0)
MCHC: 34.3 g/dL (ref 30.0–36.0)
MCV: 98.2 fL (ref 80.0–100.0)
Monocytes Absolute: 0.7 10*3/uL (ref 0.1–1.0)
Monocytes Relative: 13 %
Neutro Abs: 2.7 10*3/uL (ref 1.7–7.7)
Neutrophils Relative %: 54 %
Platelet Count: 180 10*3/uL (ref 150–400)
RBC: 3.41 MIL/uL — ABNORMAL LOW (ref 4.22–5.81)
RDW: 11.9 % (ref 11.5–15.5)
WBC Count: 5.1 10*3/uL (ref 4.0–10.5)
nRBC: 0 % (ref 0.0–0.2)

## 2019-06-28 LAB — CMP (CANCER CENTER ONLY)
ALT: 13 U/L (ref 0–44)
AST: 17 U/L (ref 15–41)
Albumin: 4.1 g/dL (ref 3.5–5.0)
Alkaline Phosphatase: 46 U/L (ref 38–126)
Anion gap: 7 (ref 5–15)
BUN: 23 mg/dL (ref 8–23)
CO2: 30 mmol/L (ref 22–32)
Calcium: 10.1 mg/dL (ref 8.9–10.3)
Chloride: 101 mmol/L (ref 98–111)
Creatinine: 1.31 mg/dL — ABNORMAL HIGH (ref 0.61–1.24)
GFR, Est AFR Am: 58 mL/min — ABNORMAL LOW (ref >60)
GFR, Estimated: 50 mL/min — ABNORMAL LOW (ref >60)
Glucose, Bld: 108 mg/dL — ABNORMAL HIGH (ref 70–99)
Potassium: 3.6 mmol/L (ref 3.5–5.1)
Sodium: 138 mmol/L (ref 135–145)
Total Bilirubin: 0.7 mg/dL (ref 0.3–1.2)
Total Protein: 6.1 g/dL — ABNORMAL LOW (ref 6.5–8.1)

## 2019-06-28 MED ORDER — SODIUM CHLORIDE 0.9% FLUSH
10.0000 mL | Freq: Once | INTRAVENOUS | Status: AC
  Administered 2019-06-28: 10 mL via INTRAVENOUS
  Filled 2019-06-28: qty 10

## 2019-06-28 MED ORDER — DARATUMUMAB-HYALURONIDASE-FIHJ 1800-30000 MG-UT/15ML ~~LOC~~ SOLN
1800.0000 mg | Freq: Once | SUBCUTANEOUS | Status: AC
  Administered 2019-06-28: 1800 mg via SUBCUTANEOUS
  Filled 2019-06-28: qty 15

## 2019-06-28 MED ORDER — SODIUM CHLORIDE 0.9% FLUSH
10.0000 mL | INTRAVENOUS | Status: DC | PRN
  Administered 2019-06-28: 10 mL
  Filled 2019-06-28: qty 10

## 2019-06-28 MED ORDER — ACETAMINOPHEN 325 MG PO TABS
ORAL_TABLET | ORAL | Status: AC
  Filled 2019-06-28: qty 2

## 2019-06-28 MED ORDER — MONTELUKAST SODIUM 10 MG PO TABS: 10.0000 mg | ORAL_TABLET | Freq: Every day | ORAL | 5 refills | Status: DC

## 2019-06-28 MED ORDER — HEPARIN SOD (PORK) LOCK FLUSH 100 UNIT/ML IV SOLN
500.0000 [IU] | Freq: Once | INTRAVENOUS | Status: AC | PRN
  Administered 2019-06-28: 500 [IU]
  Filled 2019-06-28: qty 5

## 2019-06-28 MED ORDER — DEXAMETHASONE 4 MG PO TABS
20.0000 mg | ORAL_TABLET | Freq: Once | ORAL | Status: AC
  Administered 2019-06-28: 20 mg via ORAL

## 2019-06-28 MED ORDER — ACETAMINOPHEN 325 MG PO TABS
650.0000 mg | ORAL_TABLET | Freq: Once | ORAL | Status: AC
  Administered 2019-06-28: 650 mg via ORAL

## 2019-06-28 MED ORDER — DIPHENHYDRAMINE HCL 25 MG PO CAPS
50.0000 mg | ORAL_CAPSULE | Freq: Once | ORAL | Status: AC
  Administered 2019-06-28: 50 mg via ORAL

## 2019-06-28 MED ORDER — DIPHENHYDRAMINE HCL 25 MG PO CAPS
ORAL_CAPSULE | ORAL | Status: AC
  Filled 2019-06-28: qty 2

## 2019-06-28 MED ORDER — DEXAMETHASONE 4 MG PO TABS
ORAL_TABLET | ORAL | Status: AC
  Filled 2019-06-28: qty 5

## 2019-06-28 MED FILL — MONTELUKAST SOD 10 MG TAB: 10 | 30 days supply | Qty: 30 | Fill #0

## 2019-06-28 NOTE — Progress Notes (Signed)
Pt refused to stay x 1 hr observation. VSS

## 2019-06-28 NOTE — Patient Instructions (Signed)
Daratumumab injection What is this medicine? DARATUMUMAB (dar a toom ue mab) is a monoclonal antibody. It is used to treat multiple myeloma. This medicine may be used for other purposes; ask your health care provider or pharmacist if you have questions. COMMON BRAND NAME(S): DARZALEX What should I tell my health care provider before I take this medicine? They need to know if you have any of these conditions:  infection (especially a virus infection such as chickenpox, herpes, or hepatitis B virus)  lung or breathing disease  an unusual or allergic reaction to daratumumab, other medicines, foods, dyes, or preservatives  pregnant or trying to get pregnant  breast-feeding How should I use this medicine? This medicine is for infusion into a vein. It is given by a health care professional in a hospital or clinic setting. Talk to your pediatrician regarding the use of this medicine in children. Special care may be needed. Overdosage: If you think you have taken too much of this medicine contact a poison control center or emergency room at once. NOTE: This medicine is only for you. Do not share this medicine with others. What if I miss a dose? Keep appointments for follow-up doses as directed. It is important not to miss your dose. Call your doctor or health care professional if you are unable to keep an appointment. What may interact with this medicine? Interactions have not been studied. This list may not describe all possible interactions. Give your health care provider a list of all the medicines, herbs, non-prescription drugs, or dietary supplements you use. Also tell them if you smoke, drink alcohol, or use illegal drugs. Some items may interact with your medicine. What should I watch for while using this medicine? This drug may make you feel generally unwell. Report any side effects. Continue your course of treatment even though you feel ill unless your doctor tells you to stop. This  medicine can cause serious allergic reactions. To reduce your risk you may need to take medicine before treatment with this medicine. Take your medicine as directed. This medicine can affect the results of blood tests to match your blood type. These changes can last for up to 6 months after the final dose. Your healthcare provider will do blood tests to match your blood type before you start treatment. Tell all of your healthcare providers that you are being treated with this medicine before receiving a blood transfusion. This medicine can affect the results of some tests used to determine treatment response; extra tests may be needed to evaluate response. Do not become pregnant while taking this medicine or for 3 months after stopping it. Women should inform their doctor if they wish to become pregnant or think they might be pregnant. There is a potential for serious side effects to an unborn child. Talk to your health care professional or pharmacist for more information. What side effects may I notice from receiving this medicine? Side effects that you should report to your doctor or health care professional as soon as possible:  allergic reactions like skin rash, itching or hives, swelling of the face, lips, or tongue  breathing problems  chills  cough  dizziness  feeling faint or lightheaded  headache  low blood counts - this medicine may decrease the number of white blood cells, red blood cells and platelets. You may be at increased risk for infections and bleeding.  nausea, vomiting  shortness of breath  signs of decreased platelets or bleeding - bruising, pinpoint red spots on  the skin, black, tarry stools, blood in the urine  signs of decreased red blood cells - unusually weak or tired, feeling faint or lightheaded, falls  signs of infection - fever or chills, cough, sore throat, pain or difficulty passing urine  signs and symptoms of liver injury like dark yellow or brown  urine; general ill feeling or flu-like symptoms; light-colored stools; loss of appetite; right upper belly pain; unusually weak or tired; yellowing of the eyes or skin Side effects that usually do not require medical attention (report to your doctor or health care professional if they continue or are bothersome):  back pain  constipation  loss of appetite  diarrhea  joint pain  muscle cramps  pain, tingling, numbness in the hands or feet  swelling of the ankles, feet, hands  tiredness  trouble sleeping This list may not describe all possible side effects. Call your doctor for medical advice about side effects. You may report side effects to FDA at 1-800-FDA-1088. Where should I keep my medicine? Keep out of the reach of children. This drug is given in a hospital or clinic and will not be stored at home. NOTE: This sheet is a summary. It may not cover all possible information. If you have questions about this medicine, talk to your doctor, pharmacist, or health care provider.  2020 Elsevier/Gold Standard (2018-02-24 14:00:48)

## 2019-06-28 NOTE — Progress Notes (Signed)
Hematology and Oncology Follow Up Visit  Robert [name] [id number] [birth year] 86 y.o. 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 3 weeks for hemoglobin less than 10   Interim History:  Robert [name] is here today for follow-up.  He looks quite good.  He feels okay.  He is doing nicely with his blood pressure medications at home.  He show me all of his readings at home.  Actually they are much better than when he is monitored here.  The daratumumab is working quite nicely.  His last M spike was 0.1 g/dL.  The IgG level was 590 mg/dL.  The kappa light chain was 1.2 mg/dL.  He has had no problems with fever.  He has had no cough.  He gets his coronavirus vaccine I think in a couple weeks.  I will see a problem with him getting this.  He has had no change in bowel or bladder habits.  He has had no rashes.  His appetite is okay.  He is watching what he eats.  His wife is very proactive and is making sure that he does not have salt in his diet.  Overall, his performance status is ECOG 1.   Medications:  Allergies as of 06/28/2019   No Known Allergies     Medication List       Accurate as of June 28, 2019 10:06 AM. If you have any questions, ask your nurse or doctor.        aspirin 81 MG tablet Take 81 mg by mouth daily.   bimatoprost 0.01 % Soln Commonly known as: LUMIGAN Place 1 drop into both eyes at bedtime.   CALTRATE 600+D PLUS PO Take 1 tablet by mouth daily.   carvedilol 12.5 MG tablet Commonly known as: COREG Take 1 tablet (12.5 mg total) by mouth 2 (two) times  daily with a meal.   cloNIDine 0.1 MG tablet Commonly known as: CATAPRES Take 1 tablet (0.1 mg total) by mouth daily. Take an extra tablet as needed for blood pressure greater than 170/100 (x1 per day)   CVS VITAMIN B12 2000 MCG tablet Generic drug: cyanocobalamin Take 2,000 mcg by mouth at bedtime.   famciclovir 250 MG tablet Commonly known as: FAMVIR TAKE 1 TABLET DAILY   furosemide 20 MG tablet Commonly known as: LASIX Take 2 tablets (40 mg total) by mouth daily. What changed: when to take this   HCA LAX-X PO Take by mouth every three (3) days as needed.   hydrochlorothiazide 25 MG tablet Commonly known as: HYDRODIURIL 25 mg daily.   losartan 100 MG tablet Commonly known as: COZAAR Take 100 mg by mouth daily.   metolazone 5 MG tablet Commonly known as: ZAROXOLYN Take 1 pill 1 hour prior to taking Lasix daily   multivitamin with minerals Tabs tablet Take 1 tablet by mouth daily.   potassium chloride SA 20 MEQ tablet Commonly known as: KLOR-CON Take 20 mEq by mouth daily.   pravastatin 20 MG tablet Commonly known as: PRAVACHOL Take 20 mg by mouth at bedtime.   SYSTANE  OP Place 1 drop into both eyes daily as needed (dry eyes).   traMADol 50 MG tablet Commonly known as: ULTRAM Take 1 tablet (50 mg total) by mouth every 6 (six) hours as needed.   Vitamin D3 25 MCG (1000 UT) Caps Take 1,000 Units by mouth daily.   Zometa 4 MG/100ML IVPB Generic drug: Zoledronic Acid Inject 4 mg into the vein every 4 (four) months. 05/03/2019 Administered by Dr. Jonette Eva at [facility] - last infusion 04/19/19       Allergies: No Known Allergies  Past Medical History, Surgical history, Social history, and Family History were reviewed and updated.  Review of Systems: Review of Systems  Constitutional: Negative.   HENT: Negative.   Eyes: Negative.   Respiratory: Negative.   Cardiovascular: Positive for leg swelling.  Gastrointestinal: Positive for  heartburn.  Genitourinary: Negative.   Musculoskeletal: Positive for joint pain.  Skin: Negative.   Neurological: Negative.   Endo/Heme/Allergies: Negative.   Psychiatric/Behavioral: Negative.      Physical Exam:  weight is 214 lb (97.1 kg). His blood pressure is 157/95 (abnormal).   Wt Readings from Last 3 Encounters:  06/28/19 214 lb (97.1 kg)  06/28/19 214 lb 1.9 oz (97.1 kg)  05/31/19 211 lb (95.7 kg)    Physical Exam Vitals reviewed.  HENT:     Head: Normocephalic and atraumatic.  Eyes:     Pupils: Pupils are equal, round, and reactive to light.  Cardiovascular:     Rate and Rhythm: Normal rate and regular rhythm.     Heart sounds: Normal heart sounds.  Pulmonary:     Effort: Pulmonary effort is normal.     Breath sounds: Normal breath sounds.  Abdominal:     General: Bowel sounds are normal.     Palpations: Abdomen is soft.     Comments: Abdominal exam is obese but soft.  He has good bowel sounds.  There is no fluid wave.  There is no palpable liver or spleen tip.  Musculoskeletal:        General: No tenderness or deformity. Normal range of motion.     Cervical back: Normal range of motion.     Comments: Extremities shows 1+ edema in his lower legs and feet.  This is mildly pitting.  He has decent range of motion of his joints.  He has decent strength.  Lymphadenopathy:     Cervical: No cervical adenopathy.  Skin:    General: Skin is warm and dry.     Findings: No erythema or rash.  Neurological:     Mental Status: He is alert and oriented to person, place, and time.  Psychiatric:        Behavior: Behavior normal.        Thought Content: Thought content normal.        Judgment: Judgment normal.      Lab Results  Component Value Date   WBC 5.1 06/28/2019   HGB 11.5 (L) 06/28/2019   HCT 33.5 (L) 06/28/2019   MCV 98.2 06/28/2019   PLT 180 06/28/2019   Lab Results  Component Value Date   FERRITIN 1,127 (H) 04/05/2019   IRON 106 04/05/2019   TIBC 259  04/05/2019   UIBC 152 04/05/2019   IRONPCTSAT 41 04/05/2019   Lab Results  Component Value Date   RETICCTPCT 2.2 04/05/2019   RBC 3.41 (L) 06/28/2019   Lab Results  Component Value Date   KPAFRELGTCHN 11.7 05/31/2019   LAMBDASER 7.6 05/31/2019  KAPLAMBRATIO 1.54 05/31/2019   Lab Results  Component Value Date   IGGSERUM 571 (L) 05/31/2019   IGGSERUM 658 05/31/2019   IGA 20 (L) 05/31/2019   IGA 21 (L) 05/31/2019   IGMSERUM 34 05/31/2019   IGMSERUM 33 05/31/2019   Lab Results  Component Value Date   TOTALPROTELP 6.1 05/31/2019   ALBUMINELP 3.9 05/31/2019   A1GS 0.1 05/31/2019   A2GS 0.8 05/31/2019   BETS 0.8 05/31/2019   BETA2SER 0.3 01/03/2015   GAMS 0.4 05/31/2019   MSPIKE 0.1 (H) 05/31/2019   SPEI Comment 01/11/2019     Chemistry      Component Value Date/Time   NA 136 05/31/2019 0900   NA 137 04/08/2017 1141   NA 138 10/29/2015 1029   K 3.4 (L) 05/31/2019 0900   K 3.4 04/08/2017 1141   K 3.9 10/29/2015 1029   CL 98 05/31/2019 0900   CL 102 04/08/2017 1141   CO2 30 05/31/2019 0900   CO2 30 04/08/2017 1141   CO2 27 10/29/2015 1029   BUN 25 (H) 05/31/2019 0900   BUN 16 04/08/2017 1141   BUN 13.8 10/29/2015 1029   CREATININE 1.27 (H) 05/31/2019 0900   CREATININE 1.3 (H) 04/08/2017 1141   CREATININE 1.1 10/29/2015 1029      Component Value Date/Time   CALCIUM 10.2 05/31/2019 0900   CALCIUM 9.3 04/08/2017 1141   CALCIUM 9.3 10/29/2015 1029   ALKPHOS 48 05/31/2019 0900   ALKPHOS 66 04/08/2017 1141   ALKPHOS 52 10/29/2015 1029   AST 16 05/31/2019 0900   AST 20 10/29/2015 1029   ALT 12 05/31/2019 0900   ALT 21 04/08/2017 1141   ALT 14 10/29/2015 1029   BILITOT 0.8 05/31/2019 0900   BILITOT 0.75 10/29/2015 1029       Impression and Plan: Mr. Vandekamp is a very pleasant 86 yo African American gentleman with relapsedIgG kappamyeloma.He has a history of stem cell transplant back in 2006.  It is apparent that the daratumumab is helping quite a bit.  I  think that if we see that his myeloma is maintaining itself, we might try to move his daratumumab out to every 6 weeks.  I am just happy that his blood pressure is doing so much better.  I worry that the consequences of hypertension will take him out much more quickly than the myeloma.  We will plan to get him back to see [place] in another 4 weeks.  We will get him back in 1 month.   Volanda Napoleon, MD 2/3/202110:06 AM

## 2019-06-29 ENCOUNTER — Telehealth: Payer: Self-pay | Admitting: Hematology & Oncology

## 2019-06-29 LAB — IGG, IGA, IGM
IgA: 20 mg/dL — ABNORMAL LOW (ref 61–437)
IgG (Immunoglobin G), Serum: 572 mg/dL — ABNORMAL LOW (ref 603–1613)
IgM (Immunoglobulin M), Srm: 28 mg/dL (ref 15–143)

## 2019-06-29 LAB — FERRITIN: Ferritin: 1061 ng/mL — ABNORMAL HIGH (ref 24–336)

## 2019-06-29 LAB — IRON AND TIBC
Iron: 82 ug/dL (ref 42–163)
Saturation Ratios: 29 % (ref 20–55)
TIBC: 281 ug/dL (ref 202–409)
UIBC: 200 ug/dL (ref 117–376)

## 2019-06-29 LAB — KAPPA/LAMBDA LIGHT CHAINS
Kappa free light chain: 12.2 mg/L (ref 3.3–19.4)
Kappa, lambda light chain ratio: 1.85 — ABNORMAL HIGH (ref 0.26–1.65)
Lambda free light chains: 6.6 mg/L (ref 5.7–26.3)

## 2019-06-29 NOTE — Telephone Encounter (Signed)
No los 2/3

## 2019-07-03 LAB — PROTEIN ELECTROPHORESIS, SERUM, WITH REFLEX
A/G Ratio: 1.4 (ref 0.7–1.7)
Albumin ELP: 3.5 g/dL (ref 2.9–4.4)
Alpha-1-Globulin: 0.2 g/dL (ref 0.0–0.4)
Alpha-2-Globulin: 0.7 g/dL (ref 0.4–1.0)
Beta Globulin: 1 g/dL (ref 0.7–1.3)
Gamma Globulin: 0.6 g/dL (ref 0.4–1.8)
Globulin, Total: 2.5 g/dL (ref 2.2–3.9)
M-Spike, %: 0.1 g/dL — ABNORMAL HIGH
SPEP Interpretation: 0
Total Protein ELP: 6 g/dL (ref 6.0–8.5)

## 2019-07-03 LAB — IMMUNOFIXATION REFLEX, SERUM
IgA: 20 mg/dL — ABNORMAL LOW (ref 61–437)
IgG (Immunoglobin G), Serum: 533 mg/dL — ABNORMAL LOW (ref 603–1613)
IgM (Immunoglobulin M), Srm: 25 mg/dL (ref 15–143)

## 2019-07-13 ENCOUNTER — Ambulatory Visit: Payer: Medicare Other | Admitting: Cardiology

## 2019-07-18 ENCOUNTER — Other Ambulatory Visit: Payer: Self-pay

## 2019-07-18 ENCOUNTER — Encounter: Payer: Self-pay | Admitting: Cardiology

## 2019-07-18 ENCOUNTER — Ambulatory Visit (INDEPENDENT_AMBULATORY_CARE_PROVIDER_SITE_OTHER): Payer: Medicare Other | Admitting: Cardiology

## 2019-07-18 VITALS — BP 134/92 | Temp 98.3°F | Ht 71.0 in | Wt 218.1 lb

## 2019-07-18 DIAGNOSIS — I10 Essential (primary) hypertension: Secondary | ICD-10-CM

## 2019-07-18 DIAGNOSIS — M7989 Other specified soft tissue disorders: Secondary | ICD-10-CM | POA: Diagnosis not present

## 2019-07-18 DIAGNOSIS — C9002 Multiple myeloma in relapse: Secondary | ICD-10-CM

## 2019-07-18 NOTE — Patient Instructions (Signed)
Medication Instructions:  Your physician recommends that you continue on your current medications as directed. Please refer to the Current Medication list given to you today.  *If you need a refill on your cardiac medications before your next appointment, please call your pharmacy*  Lab Work: None.  If you have labs (blood work) drawn today and your tests are completely normal, you will receive your results only by: . MyChart Message (if you have MyChart) OR . A paper copy in the mail If you have any lab test that is abnormal or we need to change your treatment, we will call you to review the results.  Testing/Procedures: None.   Follow-Up: At [facility], you and your health needs are our priority.  As part of our continuing mission to provide you with exceptional heart care, we have created designated Provider Care Teams.  These Care Teams include your primary Cardiologist (physician) and Advanced Practice Providers (APPs -  Physician Assistants and Nurse Practitioners) who all work together to provide you with the care you need, when you need it.  Your next appointment:   3 month(s)  The format for your next appointment:   In Person  Provider:   Robert Krasowski, MD  Other Instructions  

## 2019-07-18 NOTE — Progress Notes (Signed)
Cardiology Office Note:    Date:  07/18/2019   ID:  Robert [name], DOB Sep 17, 1933, MRN [id number]  PCP:  Leanna Battles, MD  Cardiologist:  Jenne Campus, MD    Referring MD: Leanna Battles, MD   No chief complaint on file. Doing well  History of Present Illness:    Robert [name] is a 86 y.o. male who was sent initially to our office because of swelling of lower extremities that being successfully managed with diuretic, additional problems hypertension.  It seems to be controlled well now.  He denies have any issue.  There is no chest pain, tightness, squeezing, pressure, burning in the chest overall doing well  Past Medical History:  Diagnosis Date  . Anemia of chronic renal failure, stage 3 (moderate) 10/06/2018  . Arthritis   . 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 Goals of care, counseling/discussion 05/12/2018  . Hyperlipidemia   . Hypertension   . Iron deficiency anemia 10/07/2018  . 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 Multiple myeloma ([place])    2006    Past Surgical History:  Procedure Laterality Date  . CATARACT EXTRACTION BILATERAL W/ ANTERIOR VITRECTOMY    . IR IMAGING GUIDED PORT INSERTION  05/30/2018  . LIMBAL STEM CELL TRANSPLANT      Current Medications: Current Meds  Medication Sig  . aspirin 81 MG tablet Take 81 mg by mouth daily.  . bimatoprost (LUMIGAN) 0.01 % SOLN Place 1 drop into both eyes at bedtime.   . Calcium Carbonate-Vit D-Min (CALTRATE 600+D PLUS PO) Take 1 tablet by mouth daily.  . carvedilol (COREG) 12.5 MG tablet Take 1 tablet (12.5 mg total) by mouth 2 (two) times daily with a meal.  . Cholecalciferol (VITAMIN D3) 1000 UNITS CAPS Take 1,000 Units by mouth daily.   . cloNIDine (CATAPRES) 0.1 MG tablet Take 1 tablet (0.1 mg total) by mouth daily. Take an extra tablet as needed for blood pressure greater than 170/100 (x1 per day)  . cyanocobalamin ([employer]S VITAMIN B12) 2000 MCG tablet Take 2,000 mcg by mouth at bedtime.   . famciclovir  (FAMVIR) 250 MG tablet TAKE 1 TABLET DAILY  . furosemide (LASIX) 20 MG tablet Take 2 tablets (40 mg total) by mouth daily. (Patient taking differently: Take 40 mg by mouth 3 (three) times a week. )  . hydrochlorothiazide (HYDRODIURIL) 25 MG tablet 25 mg daily.  Marland Kitchen losartan (COZAAR) 100 MG tablet Take 100 mg by mouth daily.  . metolazone (ZAROXOLYN) 5 MG tablet Take 1 pill 1 hour prior to taking Lasix daily  . montelukast (SINGULAIR) 10 MG tablet Take 1 tablet (10 mg total) by mouth at bedtime.  . Multiple Vitamin (MULITIVITAMIN WITH MINERALS) TABS Take 1 tablet by mouth daily.  Vladimir Faster Glycol-Propyl Glycol (SYSTANE OP) Place 1 drop into both eyes daily as needed (dry eyes).   . potassium chloride SA (K-DUR,KLOR-CON) 20 MEQ tablet Take 20 mEq by mouth daily.  . pravastatin (PRAVACHOL) 20 MG tablet Take 20 mg by mouth at bedtime.  . Sennosides (HCA LAX-X PO) Take by mouth every three (3) days as needed.  . traMADol (ULTRAM) 50 MG tablet Take 1 tablet (50 mg total) by mouth every 6 (six) hours as needed.  . Zoledronic Acid (ZOMETA) 4 MG/100ML IVPB Inject 4 mg into the vein every 4 (four) months. 05/03/2019 Administered by Dr. Jonette Eva at [facility] [place] - last infusion 04/19/19     Allergies:   Patient has no known allergies.   Social History  Socioeconomic History  . Marital status: Married    Spouse name: Not on file  . Number of children: Not on file  . Years of education: Not on file  . Highest education level: Not on file  Occupational History  . Not on file  Tobacco Use  . Smoking status: Former Smoker    Packs/day: 4.00    Years: 9.00    Pack years: 36.00    Types: Cigars, Cigarettes    Start date: 07/08/1979    Quit date: 07/07/1988    Years since quitting: 31.0  . Smokeless tobacco: Never Used  . Tobacco comment: quit 24 years ago  Substance and Sexual Activity  . Alcohol use: Yes    Alcohol/week: 6.0 standard drinks    Types: 6 Glasses of wine per week     Comment: very seldom  . Drug use: No  . Sexual activity: Not on file  Other Topics Concern  . Not on file  Social History Narrative  . Not on file   Social Determinants of Health   Financial Resource Strain:   . Difficulty of Paying Living Expenses: Not on file  Food Insecurity:   . Worried About Charity fundraiser in the Last Year: Not on file  . Ran Out of Food in the Last Year: Not on file  Transportation Needs:   . Lack of Transportation (Medical): Not on file  . Lack of Transportation (Non-Medical): Not on file  Physical Activity:   . Days of Exercise per Week: Not on file  . Minutes of Exercise per Session: Not on file  Stress:   . Feeling of Stress : Not on file  Social Connections:   . Frequency of Communication with Friends and Family: Not on file  . Frequency of Social Gatherings with Friends and Family: Not on file  . Attends Religious Services: Not on file  . Active Member of Clubs or Organizations: Not on file  . Attends Archivist Meetings: Not on file  . Marital Status: Not on file     Family History: The patient's family history includes Diabetes in his brother; Heart attack in his father; Heart disease in his father; Throat cancer in his mother. There is no history of Colon cancer. ROS:   Please see the history of present illness.    All 14 point review of systems negative except as described per history of present illness  EKGs/Labs/Other Studies Reviewed:      Recent Labs: 09/15/2018: B Natriuretic Peptide 76.7 06/28/2019: ALT 13; BUN 23; Creatinine 1.31; Hemoglobin 11.5; Platelet Count 180; Potassium 3.6; Sodium 138  Recent Lipid Panel No results found for: CHOL, TRIG, HDL, CHOLHDL, VLDL, LDLCALC, LDLDIRECT  Physical Exam:    VS:  BP (!) 134/92   Temp 98.3 F (36.8 C)   Ht '5\' 11"'  (1.803 m)   Wt 218 lb 1.9 oz (98.9 kg)   SpO2 98%   BMI 30.42 kg/m     Wt Readings from Last 3 Encounters:  07/18/19 218 lb 1.9 oz (98.9 kg)  06/28/19  214 lb (97.1 kg)  06/28/19 214 lb 1.9 oz (97.1 kg)     GEN:  Well nourished, well developed in no acute distress HEENT: Normal NECK: No JVD; No carotid bruits LYMPHATICS: No lymphadenopathy CARDIAC: RRR, no murmurs, no rubs, no gallops RESPIRATORY:  Clear to auscultation without rales, wheezing or rhonchi  ABDOMEN: Soft, non-tender, non-distended MUSCULOSKELETAL:  No edema; No deformity  SKIN: Warm and dry LOWER  EXTREMITIES: no swelling NEUROLOGIC:  Alert and oriented x 3 PSYCHIATRIC:  Normal affect   ASSESSMENT:    1. Essential hypertension   2. Multiple myeloma in relapse ([place])   3. Swelling of both lower extremities    PLAN:    In order of problems listed above:  1. Essential hypertension blood pressure well controlled continue present management 2. Multiple myeloma in relapse followed by oncology team 3. Swelling of the lower extremities controlled.   Medication Adjustments/Labs and Tests Ordered: Current medicines are reviewed at length with the patient today.  Concerns regarding medicines are outlined above.  No orders of the defined types were placed in this encounter.  Medication changes: No orders of the defined types were placed in this encounter.   Signed, Park Liter, MD, [facility] 07/18/2019 4:35 PM    [place]

## 2019-07-20 ENCOUNTER — Ambulatory Visit: Payer: Medicare Other | Attending: Internal Medicine

## 2019-07-20 DIAGNOSIS — Z23 Encounter for immunization: Secondary | ICD-10-CM | POA: Insufficient documentation

## 2019-07-20 NOTE — Progress Notes (Signed)
   Covid-19 Vaccination Clinic  Name:  VINCEN PENTA    MRN: [id number] DOB: 03-23-1934  07/20/2019  Mr. Kellom was observed post Covid-19 immunization for 15 minutes without incidence. He was provided with Vaccine Information Sheet and instruction to access the V-Safe system.   Mr. Miyata was instructed to call 911 with any severe reactions post vaccine: Marland Kitchen Difficulty breathing  . Swelling of your face and throat  . A fast heartbeat  . A bad rash all over your body  . Dizziness and weakness    Immunizations Administered    Name Date Dose VIS Date Route   Pfizer COVID-19 Vaccine 07/20/2019 10:44 AM 0.3 mL 05/05/2019 Intramuscular   Manufacturer: [place]   Lot: [id number]   [place]: [id number]

## 2019-07-26 ENCOUNTER — Inpatient Hospital Stay: Payer: Medicare Other

## 2019-07-26 ENCOUNTER — Other Ambulatory Visit: Payer: Self-pay

## 2019-07-26 ENCOUNTER — Encounter: Payer: Self-pay | Admitting: Hematology & Oncology

## 2019-07-26 ENCOUNTER — Telehealth: Payer: Self-pay | Admitting: Hematology & Oncology

## 2019-07-26 ENCOUNTER — Inpatient Hospital Stay: Payer: Medicare Other | Attending: Hematology & Oncology | Admitting: Hematology & Oncology

## 2019-07-26 VITALS — BP 174/78 | HR 57 | Resp 18

## 2019-07-26 DIAGNOSIS — Z79899 Other long term (current) drug therapy: Secondary | ICD-10-CM | POA: Insufficient documentation

## 2019-07-26 DIAGNOSIS — C9002 Multiple myeloma in relapse: Secondary | ICD-10-CM

## 2019-07-26 DIAGNOSIS — Z5112 Encounter for antineoplastic immunotherapy: Secondary | ICD-10-CM | POA: Diagnosis not present

## 2019-07-26 DIAGNOSIS — D630 Anemia in neoplastic disease: Secondary | ICD-10-CM | POA: Insufficient documentation

## 2019-07-26 DIAGNOSIS — C9000 Multiple myeloma not having achieved remission: Secondary | ICD-10-CM

## 2019-07-26 LAB — CBC WITH DIFFERENTIAL (CANCER CENTER ONLY)
Abs Immature Granulocytes: 0.01 10*3/uL (ref 0.00–0.07)
Basophils Absolute: 0 10*3/uL (ref 0.0–0.1)
Basophils Relative: 0 %
Eosinophils Absolute: 0.1 10*3/uL (ref 0.0–0.5)
Eosinophils Relative: 2 %
HCT: 32.9 % — ABNORMAL LOW (ref 39.0–52.0)
Hemoglobin: 11 g/dL — ABNORMAL LOW (ref 13.0–17.0)
Immature Granulocytes: 0 %
Lymphocytes Relative: 28 %
Lymphs Abs: 1.5 10*3/uL (ref 0.7–4.0)
MCH: 32.9 pg (ref 26.0–34.0)
MCHC: 33.4 g/dL (ref 30.0–36.0)
MCV: 98.5 fL (ref 80.0–100.0)
Monocytes Absolute: 0.7 10*3/uL (ref 0.1–1.0)
Monocytes Relative: 12 %
Neutro Abs: 3.1 10*3/uL (ref 1.7–7.7)
Neutrophils Relative %: 58 %
Platelet Count: 185 10*3/uL (ref 150–400)
RBC: 3.34 MIL/uL — ABNORMAL LOW (ref 4.22–5.81)
RDW: 12 % (ref 11.5–15.5)
WBC Count: 5.4 10*3/uL (ref 4.0–10.5)
nRBC: 0 % (ref 0.0–0.2)

## 2019-07-26 LAB — CMP (CANCER CENTER ONLY)
ALT: 11 U/L (ref 0–44)
AST: 16 U/L (ref 15–41)
Albumin: 4 g/dL (ref 3.5–5.0)
Alkaline Phosphatase: 45 U/L (ref 38–126)
Anion gap: 7 (ref 5–15)
BUN: 19 mg/dL (ref 8–23)
CO2: 28 mmol/L (ref 22–32)
Calcium: 10 mg/dL (ref 8.9–10.3)
Chloride: 100 mmol/L (ref 98–111)
Creatinine: 1.21 mg/dL (ref 0.61–1.24)
GFR, Est AFR Am: >60 mL/min (ref >60)
GFR, Estimated: 55 mL/min — ABNORMAL LOW (ref >60)
Glucose, Bld: 111 mg/dL — ABNORMAL HIGH (ref 70–99)
Potassium: 3.7 mmol/L (ref 3.5–5.1)
Sodium: 135 mmol/L (ref 135–145)
Total Bilirubin: 0.8 mg/dL (ref 0.3–1.2)
Total Protein: 6.1 g/dL — ABNORMAL LOW (ref 6.5–8.1)

## 2019-07-26 LAB — LACTATE DEHYDROGENASE: LDH: 222 U/L — ABNORMAL HIGH (ref 98–192)

## 2019-07-26 MED ORDER — ACETAMINOPHEN 325 MG PO TABS
650.0000 mg | ORAL_TABLET | Freq: Once | ORAL | Status: AC
  Administered 2019-07-26: 650 mg via ORAL

## 2019-07-26 MED ORDER — DIPHENHYDRAMINE HCL 25 MG PO CAPS
50.0000 mg | ORAL_CAPSULE | Freq: Once | ORAL | Status: AC
  Administered 2019-07-26: 50 mg via ORAL

## 2019-07-26 MED ORDER — HEPARIN SOD (PORK) LOCK FLUSH 100 UNIT/ML IV SOLN
500.0000 [IU] | Freq: Once | INTRAVENOUS | Status: AC | PRN
  Administered 2019-07-26: 500 [IU]
  Filled 2019-07-26: qty 5

## 2019-07-26 MED ORDER — DARATUMUMAB-HYALURONIDASE-FIHJ 1800-30000 MG-UT/15ML ~~LOC~~ SOLN
1800.0000 mg | Freq: Once | SUBCUTANEOUS | Status: AC
  Administered 2019-07-26: 12:00:00 1800 mg via SUBCUTANEOUS
  Filled 2019-07-26: qty 15

## 2019-07-26 MED ORDER — SODIUM CHLORIDE 0.9% FLUSH
10.0000 mL | INTRAVENOUS | Status: DC | PRN
  Administered 2019-07-26: 10 mL
  Filled 2019-07-26: qty 10

## 2019-07-26 MED ORDER — ACETAMINOPHEN 325 MG PO TABS
ORAL_TABLET | ORAL | Status: AC
  Filled 2019-07-26: qty 2

## 2019-07-26 MED ORDER — DEXAMETHASONE 4 MG PO TABS
20.0000 mg | ORAL_TABLET | Freq: Once | ORAL | Status: AC
  Administered 2019-07-26: 20 mg via ORAL

## 2019-07-26 MED ORDER — DIPHENHYDRAMINE HCL 25 MG PO CAPS
ORAL_CAPSULE | ORAL | Status: AC
  Filled 2019-07-26: qty 2

## 2019-07-26 MED ORDER — DEXAMETHASONE 4 MG PO TABS
ORAL_TABLET | ORAL | Status: AC
  Filled 2019-07-26: qty 5

## 2019-07-26 NOTE — Progress Notes (Addendum)
Hematology and Oncology Follow Up Visit  Robert [name] [id number] June 10, 1933 86 y.o. 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 3 weeks for hemoglobin less than 10   Interim History:  Robert [name] is here today for follow-up.  He looks quite good.  He feels okay.  He is doing nicely with his blood pressure medications at home.  He show me all of his readings at home.  Actually they are much better than when he is monitored here.  The daratumumab is working quite nicely.  His last M spike was 0.1 g/dL.  The IgG level was 533 mg/dL.  The kappa light chain was 1.2 mg/dL.  He has had no problems with fever.  He has had no cough.  He gets his coronavirus vaccine I think in a couple weeks.  I will see a problem with him getting this.  He has had no change in bowel or bladder habits.  He has had no rashes.  His appetite is okay.  Unfortunately, his weight is going back up.  He says he will lose weight.  I have heard this for the past 3 years.  I know that his wife is trying her best to make sure he eats a nutritious diet.  I am sure that he probably snacks a lot.    Overall, his performance status is ECOG 1.   Medications:  Allergies as of 07/26/2019   No Known Allergies     Medication List       Accurate as of July 26, 2019 10:50 AM. If you have any questions, ask your nurse or doctor.        STOP taking these medications   hydrochlorothiazide 25 MG tablet Commonly known as: HYDRODIURIL Stopped by: Volanda Napoleon, MD   traMADol 50 MG tablet Commonly known as: ULTRAM Stopped  by: Volanda Napoleon, MD     TAKE these medications   aspirin 81 MG tablet Take 81 mg by mouth daily.   bimatoprost 0.01 % Soln Commonly known as: LUMIGAN Place 1 drop into both eyes at bedtime.   CALTRATE 600+D PLUS PO Take 1 tablet by mouth daily.   carvedilol 12.5 MG tablet Commonly known as: COREG Take 1 tablet (12.5 mg total) by mouth 2 (two) times daily with a meal.   cloNIDine 0.1 MG tablet Commonly known as: CATAPRES Take 1 tablet (0.1 mg total) by mouth daily. Take an extra tablet as needed for blood pressure greater than 170/100 (x1 per day) What changed:   when to take this  reasons to take this   [employer]S VITAMIN B12 2000 MCG tablet Generic drug: cyanocobalamin Take 2,000 mcg by mouth at bedtime.   famciclovir 250 MG tablet Commonly known as: FAMVIR TAKE 1 TABLET DAILY   furosemide 20 MG tablet Commonly known as: LASIX Take 2 tablets (40 mg total) by mouth daily. What changed: when to take this   HCA LAX-X PO Take by mouth every three (3) days as needed.   losartan 100 MG tablet Commonly known as: COZAAR Take 100 mg  by mouth daily.   metolazone 5 MG tablet Commonly known as: ZAROXOLYN Take 1 pill 1 hour prior to taking Lasix daily   montelukast 10 MG tablet Commonly known as: Singulair Take 1 tablet (10 mg total) by mouth at bedtime. What changed:   when to take this  additional instructions   multivitamin with minerals Tabs tablet Take 1 tablet by mouth daily.   potassium chloride SA 20 MEQ tablet Commonly known as: KLOR-CON Take 20 mEq by mouth daily.   pravastatin 20 MG tablet Commonly known as: PRAVACHOL Take 20 mg by mouth at bedtime.   SYSTANE OP Place 1 drop into both eyes daily as needed (dry eyes).   Vitamin D3 25 MCG (1000 UT) Caps Take 1,000 Units by mouth daily.   Zometa 4 MG/100ML IVPB Generic drug: Zoledronic Acid Inject 4 mg into the vein every 4 (four) months. 05/03/2019 Administered by Dr. Jonette Eva at [facility] - last infusion 04/19/19       Allergies: No Known Allergies  Past Medical History, Surgical history, Social history, and Family History were reviewed and updated.  Review of Systems: Review of Systems  Constitutional: Negative.   HENT: Negative.   Eyes: Negative.   Respiratory: Negative.   Cardiovascular: Positive for leg swelling.  Gastrointestinal: Positive for heartburn.  Genitourinary: Negative.   Musculoskeletal: Positive for joint pain.  Skin: Negative.   Neurological: Negative.   Endo/Heme/Allergies: Negative.   Psychiatric/Behavioral: Negative.      Physical Exam:  vitals were not taken for this visit.   Wt Readings from Last 3 Encounters:  07/26/19 219 lb (99.3 kg)  07/18/19 218 lb 1.9 oz (98.9 kg)  06/28/19 214 lb (97.1 kg)    Physical Exam Vitals reviewed.  HENT:     Head: Normocephalic and atraumatic.  Eyes:     Pupils: Pupils are equal, round, and reactive to light.  Cardiovascular:     Rate and Rhythm: Normal rate and regular rhythm.     Heart sounds: Normal heart sounds.  Pulmonary:     Effort: Pulmonary effort is normal.     Breath sounds: Normal breath sounds.  Abdominal:     General: Bowel sounds are normal.     Palpations: Abdomen is soft.     Comments: Abdominal exam is obese but soft.  He has good bowel sounds.  There is no fluid wave.  There is no palpable liver or spleen tip.  Musculoskeletal:        General: No tenderness or deformity. Normal range of motion.     Cervical back: Normal range of motion.     Comments: Extremities shows 1+ edema in his lower legs and feet.  This is mildly pitting.  He has decent range of motion of his joints.  He has decent strength.  Lymphadenopathy:     Cervical: No cervical adenopathy.  Skin:    General: Skin is warm and dry.     Findings: No erythema or rash.  Neurological:     Mental Status: He is alert and oriented to person, place, and time.  Psychiatric:        Behavior:  Behavior normal.        Thought Content: Thought content normal.        Judgment: Judgment normal.      Lab Results  Component Value Date   WBC 5.4 07/26/2019   HGB 11.0 (L) 07/26/2019   HCT 32.9 (L) 07/26/2019   MCV 98.5 07/26/2019  PLT 185 07/26/2019   Lab Results  Component Value Date   FERRITIN 1,061 (H) 06/28/2019   IRON 82 06/28/2019   TIBC 281 06/28/2019   UIBC 200 06/28/2019   IRONPCTSAT 29 06/28/2019   Lab Results  Component Value Date   RETICCTPCT 2.2 04/05/2019   RBC 3.34 (L) 07/26/2019   Lab Results  Component Value Date   KPAFRELGTCHN 12.2 06/28/2019   LAMBDASER 6.6 06/28/2019   KAPLAMBRATIO 1.85 (H) 06/28/2019   Lab Results  Component Value Date   IGGSERUM 533 (L) 06/28/2019   IGA 20 (L) 06/28/2019   IGMSERUM 25 06/28/2019   Lab Results  Component Value Date   TOTALPROTELP 6.0 06/28/2019   ALBUMINELP 3.5 06/28/2019   A1GS 0.2 06/28/2019   A2GS 0.7 06/28/2019   BETS 1.0 06/28/2019   BETA2SER 0.3 01/03/2015   GAMS 0.6 06/28/2019   MSPIKE 0.1 (H) 06/28/2019   SPEI Comment 01/11/2019     Chemistry      Component Value Date/Time   NA 135 07/26/2019 0950   NA 137 04/08/2017 1141   NA 138 10/29/2015 1029   K 3.7 07/26/2019 0950   K 3.4 04/08/2017 1141   K 3.9 10/29/2015 1029   CL 100 07/26/2019 0950   CL 102 04/08/2017 1141   CO2 28 07/26/2019 0950   CO2 30 04/08/2017 1141   CO2 27 10/29/2015 1029   BUN 19 07/26/2019 0950   BUN 16 04/08/2017 1141   BUN 13.8 10/29/2015 1029   CREATININE 1.21 07/26/2019 0950   CREATININE 1.3 (H) 04/08/2017 1141   CREATININE 1.1 10/29/2015 1029      Component Value Date/Time   CALCIUM 10.0 07/26/2019 0950   CALCIUM 9.3 04/08/2017 1141   CALCIUM 9.3 10/29/2015 1029   ALKPHOS 45 07/26/2019 0950   ALKPHOS 66 04/08/2017 1141   ALKPHOS 52 10/29/2015 1029   AST 16 07/26/2019 0950   AST 20 10/29/2015 1029   ALT 11 07/26/2019 0950   ALT 21 04/08/2017 1141   ALT 14 10/29/2015 1029   BILITOT 0.8  07/26/2019 0950   BILITOT 0.75 10/29/2015 1029       Impression and Plan: Mr. Duru is a very pleasant 86 yo African American gentleman with relapsedIgG kappamyeloma.He has a history of stem cell transplant back in 2006.  It is apparent that the daratumumab is helping quite a bit.  I think that if we see that his myeloma is maintaining itself, we might try to move his daratumumab out to every 6 weeks.  I am just happy that his blood pressure is doing so much better.  I worry that the consequences of hypertension will take him out much more quickly than the myeloma.  We will plan to get him back to see [place] in another 4 weeks.  We will get him back in 1 month.   Volanda Napoleon, MD 3/3/202110:50 AM

## 2019-07-26 NOTE — Patient Instructions (Signed)
Daratumumab injection What is this medicine? DARATUMUMAB (dar a toom ue mab) is a monoclonal antibody. It is used to treat multiple myeloma. This medicine may be used for other purposes; ask your health care provider or pharmacist if you have questions. COMMON BRAND NAME(S): DARZALEX What should I tell my health care provider before I take this medicine? They need to know if you have any of these conditions:  infection (especially a virus infection such as chickenpox, herpes, or hepatitis B virus)  lung or breathing disease  an unusual or allergic reaction to daratumumab, other medicines, foods, dyes, or preservatives  pregnant or trying to get pregnant  breast-feeding How should I use this medicine? This medicine is for infusion into a vein. It is given by a health care professional in a hospital or clinic setting. Talk to your pediatrician regarding the use of this medicine in children. Special care may be needed. Overdosage: If you think you have taken too much of this medicine contact a poison control center or emergency room at once. NOTE: This medicine is only for you. Do not share this medicine with others. What if I miss a dose? Keep appointments for follow-up doses as directed. It is important not to miss your dose. Call your doctor or health care professional if you are unable to keep an appointment. What may interact with this medicine? Interactions have not been studied. This list may not describe all possible interactions. Give your health care provider a list of all the medicines, herbs, non-prescription drugs, or dietary supplements you use. Also tell them if you smoke, drink alcohol, or use illegal drugs. Some items may interact with your medicine. What should I watch for while using this medicine? This drug may make you feel generally unwell. Report any side effects. Continue your course of treatment even though you feel ill unless your doctor tells you to stop. This  medicine can cause serious allergic reactions. To reduce your risk you may need to take medicine before treatment with this medicine. Take your medicine as directed. This medicine can affect the results of blood tests to match your blood type. These changes can last for up to 6 months after the final dose. Your healthcare provider will do blood tests to match your blood type before you start treatment. Tell all of your healthcare providers that you are being treated with this medicine before receiving a blood transfusion. This medicine can affect the results of some tests used to determine treatment response; extra tests may be needed to evaluate response. Do not become pregnant while taking this medicine or for 3 months after stopping it. Women should inform their doctor if they wish to become pregnant or think they might be pregnant. There is a potential for serious side effects to an unborn child. Talk to your health care professional or pharmacist for more information. What side effects may I notice from receiving this medicine? Side effects that you should report to your doctor or health care professional as soon as possible:  allergic reactions like skin rash, itching or hives, swelling of the face, lips, or tongue  breathing problems  chills  cough  dizziness  feeling faint or lightheaded  headache  low blood counts - this medicine may decrease the number of white blood cells, red blood cells and platelets. You may be at increased risk for infections and bleeding.  nausea, vomiting  shortness of breath  signs of decreased platelets or bleeding - bruising, pinpoint red spots on  the skin, black, tarry stools, blood in the urine  signs of decreased red blood cells - unusually weak or tired, feeling faint or lightheaded, falls  signs of infection - fever or chills, cough, sore throat, pain or difficulty passing urine  signs and symptoms of liver injury like dark yellow or brown  urine; general ill feeling or flu-like symptoms; light-colored stools; loss of appetite; right upper belly pain; unusually weak or tired; yellowing of the eyes or skin Side effects that usually do not require medical attention (report to your doctor or health care professional if they continue or are bothersome):  back pain  constipation  diarrhea  joint pain  muscle cramps  pain, tingling, numbness in the hands or feet  swelling of the ankles, feet, hands  tiredness  trouble sleeping This list may not describe all possible side effects. Call your doctor for medical advice about side effects. You may report side effects to FDA at 1-800-FDA-1088. Where should I keep my medicine? This drug is given in a hospital or clinic and will not be stored at home. NOTE: This sheet is a summary. It may not cover all possible information. If you have questions about this medicine, talk to your doctor, pharmacist, or health care provider.  2020 Elsevier/Gold Standard (2019-01-17 18:10:54)

## 2019-07-26 NOTE — Telephone Encounter (Signed)
Appointments scheduled calendar printed per 3/3 los 

## 2019-07-26 NOTE — Progress Notes (Signed)
Zometa is to be given every 4 months per Dr. Marin Olp.  Orders changed per his instructions.

## 2019-07-27 LAB — IGG, IGA, IGM
IgA: 18 mg/dL — ABNORMAL LOW (ref 61–437)
IgG (Immunoglobin G), Serum: 500 mg/dL — ABNORMAL LOW (ref 603–1613)
IgM (Immunoglobulin M), Srm: 26 mg/dL (ref 15–143)

## 2019-07-27 LAB — KAPPA/LAMBDA LIGHT CHAINS
Kappa free light chain: 10.9 mg/L (ref 3.3–19.4)
Kappa, lambda light chain ratio: 1.65 (ref 0.26–1.65)
Lambda free light chains: 6.6 mg/L (ref 5.7–26.3)

## 2019-07-29 ENCOUNTER — Other Ambulatory Visit: Payer: Self-pay | Admitting: Nurse Practitioner

## 2019-07-31 LAB — PROTEIN ELECTROPHORESIS, SERUM, WITH REFLEX
A/G Ratio: 1.9 — ABNORMAL HIGH (ref 0.7–1.7)
Albumin ELP: 3.7 g/dL (ref 2.9–4.4)
Alpha-1-Globulin: 0.1 g/dL (ref 0.0–0.4)
Alpha-2-Globulin: 0.8 g/dL (ref 0.4–1.0)
Beta Globulin: 0.7 g/dL (ref 0.7–1.3)
Gamma Globulin: 0.4 g/dL (ref 0.4–1.8)
Globulin, Total: 2 g/dL — ABNORMAL LOW (ref 2.2–3.9)
M-Spike, %: 0.1 g/dL — ABNORMAL HIGH
SPEP Interpretation: 0
Total Protein ELP: 5.7 g/dL — ABNORMAL LOW (ref 6.0–8.5)

## 2019-07-31 LAB — IMMUNOFIXATION REFLEX, SERUM
IgA: 19 mg/dL — ABNORMAL LOW (ref 61–437)
IgG (Immunoglobin G), Serum: 514 mg/dL — ABNORMAL LOW (ref 603–1613)
IgM (Immunoglobulin M), Srm: 26 mg/dL (ref 15–143)

## 2019-08-09 ENCOUNTER — Ambulatory Visit: Payer: Medicare Other | Attending: Internal Medicine

## 2019-08-09 DIAGNOSIS — Z23 Encounter for immunization: Secondary | ICD-10-CM

## 2019-08-09 NOTE — Progress Notes (Signed)
   Covid-19 Vaccination Clinic  Name:  [name]    MRN: [id number] DOB: 08-Jan-1934  08/09/2019  [name] was observed post Covid-19 immunization for 15 minutes without incident. He was provided with Vaccine Information Sheet and instruction to access the V-Safe system.   [name] was instructed to call 911 with any severe reactions post vaccine: Marland Kitchen Difficulty breathing  . Swelling of face and throat  . A fast heartbeat  . A bad rash all over body  . Dizziness and weakness   Immunizations Administered    Name Date Dose VIS Date Route   Pfizer COVID-19 Vaccine 08/09/2019 11:51 AM 0.3 mL 05/05/2019 Intramuscular   Manufacturer: [place]   Lot: [id number]   [place]: [id number]

## 2019-08-23 ENCOUNTER — Inpatient Hospital Stay: Payer: Medicare Other

## 2019-08-23 ENCOUNTER — Encounter: Payer: Self-pay | Admitting: Hematology & Oncology

## 2019-08-23 ENCOUNTER — Inpatient Hospital Stay (HOSPITAL_BASED_OUTPATIENT_CLINIC_OR_DEPARTMENT_OTHER): Payer: Medicare Other | Admitting: Hematology & Oncology

## 2019-08-23 ENCOUNTER — Telehealth: Payer: Self-pay | Admitting: Hematology & Oncology

## 2019-08-23 ENCOUNTER — Other Ambulatory Visit: Payer: Self-pay

## 2019-08-23 VITALS — BP 140/78 | HR 70 | Temp 97.3°F | Resp 16 | Wt 214.0 lb

## 2019-08-23 DIAGNOSIS — M899 Disorder of bone, unspecified: Secondary | ICD-10-CM

## 2019-08-23 DIAGNOSIS — C9002 Multiple myeloma in relapse: Secondary | ICD-10-CM

## 2019-08-23 DIAGNOSIS — D630 Anemia in neoplastic disease: Secondary | ICD-10-CM | POA: Diagnosis not present

## 2019-08-23 DIAGNOSIS — Z79899 Other long term (current) drug therapy: Secondary | ICD-10-CM | POA: Diagnosis not present

## 2019-08-23 DIAGNOSIS — N183 Chronic kidney disease, stage 3 unspecified: Secondary | ICD-10-CM

## 2019-08-23 DIAGNOSIS — D631 Anemia in chronic kidney disease: Secondary | ICD-10-CM

## 2019-08-23 DIAGNOSIS — C9000 Multiple myeloma not having achieved remission: Secondary | ICD-10-CM

## 2019-08-23 DIAGNOSIS — Z5112 Encounter for antineoplastic immunotherapy: Secondary | ICD-10-CM | POA: Diagnosis not present

## 2019-08-23 LAB — CMP (CANCER CENTER ONLY)
ALT: 11 U/L (ref 0–44)
AST: 15 U/L (ref 15–41)
Albumin: 4.2 g/dL (ref 3.5–5.0)
Alkaline Phosphatase: 46 U/L (ref 38–126)
Anion gap: 7 (ref 5–15)
BUN: 28 mg/dL — ABNORMAL HIGH (ref 8–23)
CO2: 29 mmol/L (ref 22–32)
Calcium: 9.8 mg/dL (ref 8.9–10.3)
Chloride: 102 mmol/L (ref 98–111)
Creatinine: 1.3 mg/dL — ABNORMAL HIGH (ref 0.61–1.24)
GFR, Est AFR Am: 58 mL/min — ABNORMAL LOW (ref >60)
GFR, Estimated: 50 mL/min — ABNORMAL LOW (ref >60)
Glucose, Bld: 113 mg/dL — ABNORMAL HIGH (ref 70–99)
Potassium: 3.6 mmol/L (ref 3.5–5.1)
Sodium: 138 mmol/L (ref 135–145)
Total Bilirubin: 0.8 mg/dL (ref 0.3–1.2)
Total Protein: 6 g/dL — ABNORMAL LOW (ref 6.5–8.1)

## 2019-08-23 LAB — CBC WITH DIFFERENTIAL (CANCER CENTER ONLY)
Abs Immature Granulocytes: 0.02 10*3/uL (ref 0.00–0.07)
Basophils Absolute: 0 10*3/uL (ref 0.0–0.1)
Basophils Relative: 0 %
Eosinophils Absolute: 0.1 10*3/uL (ref 0.0–0.5)
Eosinophils Relative: 2 %
HCT: 33.4 % — ABNORMAL LOW (ref 39.0–52.0)
Hemoglobin: 11.3 g/dL — ABNORMAL LOW (ref 13.0–17.0)
Immature Granulocytes: 0 %
Lymphocytes Relative: 31 %
Lymphs Abs: 1.8 10*3/uL (ref 0.7–4.0)
MCH: 33.3 pg (ref 26.0–34.0)
MCHC: 33.8 g/dL (ref 30.0–36.0)
MCV: 98.5 fL (ref 80.0–100.0)
Monocytes Absolute: 0.6 10*3/uL (ref 0.1–1.0)
Monocytes Relative: 11 %
Neutro Abs: 3.2 10*3/uL (ref 1.7–7.7)
Neutrophils Relative %: 56 %
Platelet Count: 188 10*3/uL (ref 150–400)
RBC: 3.39 MIL/uL — ABNORMAL LOW (ref 4.22–5.81)
RDW: 12.6 % (ref 11.5–15.5)
WBC Count: 5.7 10*3/uL (ref 4.0–10.5)
nRBC: 0 % (ref 0.0–0.2)

## 2019-08-23 LAB — LACTATE DEHYDROGENASE: LDH: 213 U/L — ABNORMAL HIGH (ref 98–192)

## 2019-08-23 MED ORDER — DIPHENHYDRAMINE HCL 25 MG PO CAPS
ORAL_CAPSULE | ORAL | Status: AC
  Filled 2019-08-23: qty 2

## 2019-08-23 MED ORDER — ZOLEDRONIC ACID 4 MG/100ML IV SOLN
4.0000 mg | Freq: Once | INTRAVENOUS | Status: AC
  Administered 2019-08-23: 4 mg via INTRAVENOUS
  Filled 2019-08-23: qty 100

## 2019-08-23 MED ORDER — DARATUMUMAB-HYALURONIDASE-FIHJ 1800-30000 MG-UT/15ML ~~LOC~~ SOLN
1800.0000 mg | Freq: Once | SUBCUTANEOUS | Status: AC
  Administered 2019-08-23: 1800 mg via SUBCUTANEOUS
  Filled 2019-08-23: qty 15

## 2019-08-23 MED ORDER — ACETAMINOPHEN 325 MG PO TABS
650.0000 mg | ORAL_TABLET | Freq: Once | ORAL | Status: AC
  Administered 2019-08-23: 650 mg via ORAL

## 2019-08-23 MED ORDER — SODIUM CHLORIDE 0.9 % IV SOLN
Freq: Once | INTRAVENOUS | Status: AC
  Filled 2019-08-23: qty 250

## 2019-08-23 MED ORDER — SODIUM CHLORIDE 0.9% FLUSH
10.0000 mL | INTRAVENOUS | Status: DC | PRN
  Administered 2019-08-23: 10 mL
  Filled 2019-08-23: qty 10

## 2019-08-23 MED ORDER — HEPARIN SOD (PORK) LOCK FLUSH 100 UNIT/ML IV SOLN
500.0000 [IU] | Freq: Once | INTRAVENOUS | Status: AC | PRN
  Administered 2019-08-23: 500 [IU]
  Filled 2019-08-23: qty 5

## 2019-08-23 MED ORDER — DIPHENHYDRAMINE HCL 25 MG PO CAPS
50.0000 mg | ORAL_CAPSULE | Freq: Once | ORAL | Status: AC
  Administered 2019-08-23: 50 mg via ORAL

## 2019-08-23 MED ORDER — DEXAMETHASONE 4 MG PO TABS
20.0000 mg | ORAL_TABLET | Freq: Once | ORAL | Status: AC
  Administered 2019-08-23: 20 mg via ORAL

## 2019-08-23 MED ORDER — ACETAMINOPHEN 325 MG PO TABS
ORAL_TABLET | ORAL | Status: AC
  Filled 2019-08-23: qty 2

## 2019-08-23 MED ORDER — DEXAMETHASONE 4 MG PO TABS
ORAL_TABLET | ORAL | Status: AC
  Filled 2019-08-23: qty 5

## 2019-08-23 NOTE — Progress Notes (Signed)
Hematology and Oncology Follow Up Visit  Robert [name] [id number] 08-03-1933 86 y.o. 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 3 weeks for hemoglobin less than 10   Interim History:  Robert [name] is here today for follow-up.  He looks quite good.  He feels okay.  He really has done well with his blood pressure.  His blood pressure today is 140/78 which is wonderful for him.  He has been active.  He has had his coronavirus vaccines.  His myeloma has been doing quite nicely.  When we last checked his myeloma studies about a month ago, his M spike was 0.1 g/dL.  The IgG level was 510 mg/dL.  His kappa light chain was 1.1 mg/dL.  He has had no problems with bowels or bladder.  He has had no cough or shortness of breath.  He has had no rashes.  There is been no headache.  He has had no problems with leg swelling.  He does take a diuretic on occasion.  Hopefully, he will be with family for the Easter weekend.  Overall, his performance status is ECOG 1.   Medications:  Allergies as of 08/23/2019   No Known Allergies     Medication List       Accurate as of August 23, 2019 11:51 AM. If you have any questions, ask your nurse or doctor.        aspirin 81 MG tablet Take 81 mg by mouth daily.   bimatoprost 0.01 % Soln Commonly known as: LUMIGAN Place 1 drop into both eyes at bedtime.   CALTRATE 600+D PLUS PO Take 1 tablet by mouth daily.   carvedilol 12.5 MG tablet Commonly known as: COREG Take 1 tablet (12.5 mg total) by mouth 2 (two) times daily with a meal.     cloNIDine 0.1 MG tablet Commonly known as: CATAPRES Take 1 tablet (0.1 mg total) by mouth daily. Take an extra tablet as needed for blood pressure greater than 170/100 (x1 per day)   [employer]S VITAMIN B12 2000 MCG tablet Generic drug: cyanocobalamin Take 2,000 mcg by mouth at bedtime.   famciclovir 250 MG tablet Commonly known as: FAMVIR TAKE 1 TABLET DAILY   furosemide 20 MG tablet Commonly known as: LASIX Take 2 tablets (40 mg total) by mouth daily.   HCA LAX-X PO Take by mouth every three (3) days as needed.   losartan 100 MG tablet Commonly known as: COZAAR Take 100 mg by mouth daily.   metolazone 5 MG tablet Commonly known as: ZAROXOLYN Take 1 pill 1 hour prior to taking Lasix daily   montelukast 10 MG tablet Commonly known as: Singulair Take 1 tablet (10 mg total) by mouth at bedtime. What changed:   when to take this  additional instructions   multivitamin with minerals Tabs tablet Take 1 tablet by mouth daily.   potassium chloride SA 20 MEQ tablet Commonly known as: KLOR-CON Take 20 mEq by mouth daily.   pravastatin 20 MG tablet Commonly known as: PRAVACHOL Take 20 mg by mouth at bedtime.   SYSTANE  OP Place 1 drop into both eyes daily as needed (dry eyes).   Vitamin D3 25 MCG (1000 UT) Caps Take 1,000 Units by mouth daily.   Zometa 4 MG/100ML IVPB Generic drug: Zoledronic Acid Inject 4 mg into the vein every 4 (four) months. 05/03/2019 Administered by Dr. Jonette Eva at [facility] - last infusion 04/19/19       Allergies: No Known Allergies  Past Medical History, Surgical history, Social history, and Family History were reviewed and updated.  Review of Systems: Review of Systems  Constitutional: Negative.   HENT: Negative.   Eyes: Negative.   Respiratory: Negative.   Cardiovascular: Positive for leg swelling.  Gastrointestinal: Positive for heartburn.  Genitourinary: Negative.   Musculoskeletal: Positive for joint pain.  Skin:  Negative.   Neurological: Negative.   Endo/Heme/Allergies: Negative.   Psychiatric/Behavioral: Negative.      Physical Exam:  weight is 214 lb 0.6 oz (97.1 kg). His temporal temperature is 97.3 F (36.3 C) (abnormal). His blood pressure is 140/78 and his pulse is 70. His respiration is 16 and oxygen saturation is 99%.   Wt Readings from Last 3 Encounters:  08/23/19 214 lb 0.6 oz (97.1 kg)  07/26/19 219 lb (99.3 kg)  07/18/19 218 lb 1.9 oz (98.9 kg)    Physical Exam Vitals reviewed.  HENT:     Head: Normocephalic and atraumatic.  Eyes:     Pupils: Pupils are equal, round, and reactive to light.  Cardiovascular:     Rate and Rhythm: Normal rate and regular rhythm.     Heart sounds: Normal heart sounds.  Pulmonary:     Effort: Pulmonary effort is normal.     Breath sounds: Normal breath sounds.  Abdominal:     General: Bowel sounds are normal.     Palpations: Abdomen is soft.     Comments: Abdominal exam is obese but soft.  He has good bowel sounds.  There is no fluid wave.  There is no palpable liver or spleen tip.  Musculoskeletal:        General: No tenderness or deformity. Normal range of motion.     Cervical back: Normal range of motion.     Comments: Extremities shows 1+ edema in his lower legs and feet.  This is mildly pitting.  He has decent range of motion of his joints.  He has decent strength.  Lymphadenopathy:     Cervical: No cervical adenopathy.  Skin:    General: Skin is warm and dry.     Findings: No erythema or rash.  Neurological:     Mental Status: He is alert and oriented to person, place, and time.  Psychiatric:        Behavior: Behavior normal.        Thought Content: Thought content normal.        Judgment: Judgment normal.      Lab Results  Component Value Date   WBC 5.7 08/23/2019   HGB 11.3 (L) 08/23/2019   HCT 33.4 (L) 08/23/2019   MCV 98.5 08/23/2019   PLT 188 08/23/2019   Lab Results  Component Value Date   FERRITIN 1,061 (H)  06/28/2019   IRON 82 06/28/2019   TIBC 281 06/28/2019   UIBC 200 06/28/2019   IRONPCTSAT 29 06/28/2019   Lab Results  Component Value Date   RETICCTPCT 2.2 04/05/2019   RBC 3.39 (L) 08/23/2019   Lab Results  Component Value Date   KPAFRELGTCHN 10.9 07/26/2019   LAMBDASER 6.6  07/26/2019   KAPLAMBRATIO 1.65 07/26/2019   Lab Results  Component Value Date   IGGSERUM 500 (L) 07/26/2019   IGGSERUM 514 (L) 07/26/2019   IGA 18 (L) 07/26/2019   IGA 19 (L) 07/26/2019   IGMSERUM 26 07/26/2019   IGMSERUM 26 07/26/2019   Lab Results  Component Value Date   TOTALPROTELP 5.7 (L) 07/26/2019   ALBUMINELP 3.7 07/26/2019   A1GS 0.1 07/26/2019   A2GS 0.8 07/26/2019   BETS 0.7 07/26/2019   BETA2SER 0.3 01/03/2015   GAMS 0.4 07/26/2019   MSPIKE 0.1 (H) 07/26/2019   SPEI Comment 01/11/2019     Chemistry      Component Value Date/Time   NA 135 07/26/2019 0950   NA 137 04/08/2017 1141   NA 138 10/29/2015 1029   K 3.7 07/26/2019 0950   K 3.4 04/08/2017 1141   K 3.9 10/29/2015 1029   CL 100 07/26/2019 0950   CL 102 04/08/2017 1141   CO2 28 07/26/2019 0950   CO2 30 04/08/2017 1141   CO2 27 10/29/2015 1029   BUN 19 07/26/2019 0950   BUN 16 04/08/2017 1141   BUN 13.8 10/29/2015 1029   CREATININE 1.21 07/26/2019 0950   CREATININE 1.3 (H) 04/08/2017 1141   CREATININE 1.1 10/29/2015 1029      Component Value Date/Time   CALCIUM 10.0 07/26/2019 0950   CALCIUM 9.3 04/08/2017 1141   CALCIUM 9.3 10/29/2015 1029   ALKPHOS 45 07/26/2019 0950   ALKPHOS 66 04/08/2017 1141   ALKPHOS 52 10/29/2015 1029   AST 16 07/26/2019 0950   AST 20 10/29/2015 1029   ALT 11 07/26/2019 0950   ALT 21 04/08/2017 1141   ALT 14 10/29/2015 1029   BILITOT 0.8 07/26/2019 0950   BILITOT 0.75 10/29/2015 1029       Impression and Plan: Robert [name] is a very pleasant 86 yo African American gentleman with relapsedIgG kappamyeloma.He has a history of stem cell transplant back in 2006.  It is apparent that  the daratumumab is helping quite a bit.  I think that if we see that his myeloma is maintaining itself, we might try to move his daratumumab out to every 6 weeks.  I am just happy that his blood pressure is doing so much better.  I worry that the consequences of hypertension will take him out much more quickly than the myeloma.  We will plan to get him back to see [place] in another 4 weeks.   Volanda Napoleon, MD 3/31/202111:51 AM

## 2019-08-23 NOTE — Telephone Encounter (Signed)
Appointments were already scheduled per 3/31 los

## 2019-08-23 NOTE — Patient Instructions (Signed)
Daratumumab injection What is this medicine? DARATUMUMAB (dar a toom ue mab) is a monoclonal antibody. It is used to treat multiple myeloma. This medicine may be used for other purposes; ask your health care provider or pharmacist if you have questions. COMMON BRAND NAME(S): DARZALEX What should I tell my health care provider before I take this medicine? They need to know if you have any of these conditions:  infection (especially a virus infection such as chickenpox, herpes, or hepatitis B virus)  lung or breathing disease  an unusual or allergic reaction to daratumumab, other medicines, foods, dyes, or preservatives  pregnant or trying to get pregnant  breast-feeding How should I use this medicine? This medicine is for infusion into a vein. It is given by a health care professional in a hospital or clinic setting. Talk to your pediatrician regarding the use of this medicine in children. Special care may be needed. Overdosage: If you think you have taken too much of this medicine contact a poison control center or emergency room at once. NOTE: This medicine is only for you. Do not share this medicine with others. What if I miss a dose? Keep appointments for follow-up doses as directed. It is important not to miss your dose. Call your doctor or health care professional if you are unable to keep an appointment. What may interact with this medicine? Interactions have not been studied. This list may not describe all possible interactions. Give your health care provider a list of all the medicines, herbs, non-prescription drugs, or dietary supplements you use. Also tell them if you smoke, drink alcohol, or use illegal drugs. Some items may interact with your medicine. What should I watch for while using this medicine? This drug may make you feel generally unwell. Report any side effects. Continue your course of treatment even though you feel ill unless your doctor tells you to stop. This  medicine can cause serious allergic reactions. To reduce your risk you may need to take medicine before treatment with this medicine. Take your medicine as directed. This medicine can affect the results of blood tests to match your blood type. These changes can last for up to 6 months after the final dose. Your healthcare provider will do blood tests to match your blood type before you start treatment. Tell all of your healthcare providers that you are being treated with this medicine before receiving a blood transfusion. This medicine can affect the results of some tests used to determine treatment response; extra tests may be needed to evaluate response. Do not become pregnant while taking this medicine or for 3 months after stopping it. Women should inform their doctor if they wish to become pregnant or think they might be pregnant. There is a potential for serious side effects to an unborn child. Talk to your health care professional or pharmacist for more information. What side effects may I notice from receiving this medicine? Side effects that you should report to your doctor or health care professional as soon as possible:  allergic reactions like skin rash, itching or hives, swelling of the face, lips, or tongue  breathing problems  chills  cough  dizziness  feeling faint or lightheaded  headache  low blood counts - this medicine may decrease the number of white blood cells, red blood cells and platelets. You may be at increased risk for infections and bleeding.  nausea, vomiting  shortness of breath  signs of decreased platelets or bleeding - bruising, pinpoint red spots on  the skin, black, tarry stools, blood in the urine  signs of decreased red blood cells - unusually weak or tired, feeling faint or lightheaded, falls  signs of infection - fever or chills, cough, sore throat, pain or difficulty passing urine  signs and symptoms of liver injury like dark yellow or brown  urine; general ill feeling or flu-like symptoms; light-colored stools; loss of appetite; right upper belly pain; unusually weak or tired; yellowing of the eyes or skin Side effects that usually do not require medical attention (report to your doctor or health care professional if they continue or are bothersome):  back pain  constipation  diarrhea  joint pain  muscle cramps  pain, tingling, numbness in the hands or feet  swelling of the ankles, feet, hands  tiredness  trouble sleeping This list may not describe all possible side effects. Call your doctor for medical advice about side effects. You may report side effects to FDA at 1-800-FDA-1088. Where should I keep my medicine? This drug is given in a hospital or clinic and will not be stored at home. NOTE: This sheet is a summary. It may not cover all possible information. If you have questions about this medicine, talk to your doctor, pharmacist, or health care provider.  2020 Elsevier/Gold Standard (2019-01-17 18:10:54)  

## 2019-08-23 NOTE — Patient Instructions (Signed)

## 2019-08-24 LAB — PROTEIN ELECTROPHORESIS, SERUM, WITH REFLEX
A/G Ratio: 1.6 (ref 0.7–1.7)
Albumin ELP: 3.7 g/dL (ref 2.9–4.4)
Alpha-1-Globulin: 0.2 g/dL (ref 0.0–0.4)
Alpha-2-Globulin: 0.9 g/dL (ref 0.4–1.0)
Beta Globulin: 0.8 g/dL (ref 0.7–1.3)
Gamma Globulin: 0.5 g/dL (ref 0.4–1.8)
Globulin, Total: 2.3 g/dL (ref 2.2–3.9)
Total Protein ELP: 6 g/dL (ref 6.0–8.5)

## 2019-08-24 LAB — KAPPA/LAMBDA LIGHT CHAINS
Kappa free light chain: 12.2 mg/L (ref 3.3–19.4)
Kappa, lambda light chain ratio: 1.63 (ref 0.26–1.65)
Lambda free light chains: 7.5 mg/L (ref 5.7–26.3)

## 2019-08-24 LAB — IGG, IGA, IGM
IgA: 18 mg/dL — ABNORMAL LOW (ref 61–437)
IgG (Immunoglobin G), Serum: 509 mg/dL — ABNORMAL LOW (ref 603–1613)
IgM (Immunoglobulin M), Srm: 24 mg/dL (ref 15–143)

## 2019-09-12 DIAGNOSIS — H401131 Primary open-angle glaucoma, bilateral, mild stage: Secondary | ICD-10-CM | POA: Diagnosis not present

## 2019-09-12 DIAGNOSIS — H26493 Other secondary cataract, bilateral: Secondary | ICD-10-CM | POA: Diagnosis not present

## 2019-09-12 DIAGNOSIS — Z961 Presence of intraocular lens: Secondary | ICD-10-CM | POA: Diagnosis not present

## 2019-09-13 NOTE — Progress Notes (Signed)
Pharmacist Chemotherapy Monitoring - Follow Up Assessment    I verify that I have reviewed each item in the below checklist:  . Regimen for the patient is scheduled for the appropriate day and plan matches scheduled date. Marland Kitchen Appropriate non-routine labs are ordered dependent on drug ordered. . If applicable, additional medications reviewed and ordered per protocol based on lifetime cumulative doses and/or treatment regimen.   Plan for follow-up and/or issues identified: No . I-vent associated with next due treatment: No . MD and/or nursing notified: No  Robert [name], Robert [name] 09/13/2019 8:37 AM

## 2019-09-16 ENCOUNTER — Encounter: Payer: Self-pay | Admitting: Cardiology

## 2019-09-20 ENCOUNTER — Inpatient Hospital Stay: Payer: Medicare Other

## 2019-09-20 ENCOUNTER — Encounter: Payer: Self-pay | Admitting: Family

## 2019-09-20 ENCOUNTER — Inpatient Hospital Stay (HOSPITAL_BASED_OUTPATIENT_CLINIC_OR_DEPARTMENT_OTHER): Payer: Medicare Other | Admitting: Family

## 2019-09-20 ENCOUNTER — Inpatient Hospital Stay: Payer: Medicare Other | Attending: Hematology & Oncology

## 2019-09-20 ENCOUNTER — Other Ambulatory Visit: Payer: Self-pay

## 2019-09-20 VITALS — BP 151/78 | HR 55 | Resp 17

## 2019-09-20 VITALS — BP 172/82 | HR 77 | Temp 97.1°F | Resp 17 | Ht 71.0 in | Wt 217.0 lb

## 2019-09-20 VITALS — BP 172/82 | Temp 97.1°F | Resp 17 | Ht 71.0 in | Wt 217.0 lb

## 2019-09-20 DIAGNOSIS — C9002 Multiple myeloma in relapse: Secondary | ICD-10-CM | POA: Insufficient documentation

## 2019-09-20 DIAGNOSIS — D631 Anemia in chronic kidney disease: Secondary | ICD-10-CM | POA: Diagnosis not present

## 2019-09-20 DIAGNOSIS — D630 Anemia in neoplastic disease: Secondary | ICD-10-CM | POA: Insufficient documentation

## 2019-09-20 DIAGNOSIS — N183 Chronic kidney disease, stage 3 unspecified: Secondary | ICD-10-CM

## 2019-09-20 DIAGNOSIS — Z5112 Encounter for antineoplastic immunotherapy: Secondary | ICD-10-CM | POA: Insufficient documentation

## 2019-09-20 DIAGNOSIS — C9000 Multiple myeloma not having achieved remission: Secondary | ICD-10-CM

## 2019-09-20 DIAGNOSIS — Z79899 Other long term (current) drug therapy: Secondary | ICD-10-CM | POA: Insufficient documentation

## 2019-09-20 DIAGNOSIS — M899 Disorder of bone, unspecified: Secondary | ICD-10-CM

## 2019-09-20 LAB — CMP (CANCER CENTER ONLY)
ALT: 11 U/L (ref 0–44)
AST: 15 U/L (ref 15–41)
Albumin: 4 g/dL (ref 3.5–5.0)
Alkaline Phosphatase: 47 U/L (ref 38–126)
Anion gap: 6 (ref 5–15)
BUN: 24 mg/dL — ABNORMAL HIGH (ref 8–23)
CO2: 29 mmol/L (ref 22–32)
Calcium: 10 mg/dL (ref 8.9–10.3)
Chloride: 105 mmol/L (ref 98–111)
Creatinine: 1.29 mg/dL — ABNORMAL HIGH (ref 0.61–1.24)
GFR, Est AFR Am: 59 mL/min — ABNORMAL LOW (ref >60)
GFR, Estimated: 51 mL/min — ABNORMAL LOW (ref >60)
Glucose, Bld: 110 mg/dL — ABNORMAL HIGH (ref 70–99)
Potassium: 3.7 mmol/L (ref 3.5–5.1)
Sodium: 140 mmol/L (ref 135–145)
Total Bilirubin: 0.8 mg/dL (ref 0.3–1.2)
Total Protein: 5.9 g/dL — ABNORMAL LOW (ref 6.5–8.1)

## 2019-09-20 LAB — CBC WITH DIFFERENTIAL (CANCER CENTER ONLY)
Abs Immature Granulocytes: 0.01 10*3/uL (ref 0.00–0.07)
Basophils Absolute: 0 10*3/uL (ref 0.0–0.1)
Basophils Relative: 1 %
Eosinophils Absolute: 0.1 10*3/uL (ref 0.0–0.5)
Eosinophils Relative: 2 %
HCT: 32.3 % — ABNORMAL LOW (ref 39.0–52.0)
Hemoglobin: 10.9 g/dL — ABNORMAL LOW (ref 13.0–17.0)
Immature Granulocytes: 0 %
Lymphocytes Relative: 31 %
Lymphs Abs: 1.7 10*3/uL (ref 0.7–4.0)
MCH: 33.1 pg (ref 26.0–34.0)
MCHC: 33.7 g/dL (ref 30.0–36.0)
MCV: 98.2 fL (ref 80.0–100.0)
Monocytes Absolute: 0.7 10*3/uL (ref 0.1–1.0)
Monocytes Relative: 12 %
Neutro Abs: 2.9 10*3/uL (ref 1.7–7.7)
Neutrophils Relative %: 54 %
Platelet Count: 173 10*3/uL (ref 150–400)
RBC: 3.29 MIL/uL — ABNORMAL LOW (ref 4.22–5.81)
RDW: 12.7 % (ref 11.5–15.5)
WBC Count: 5.4 10*3/uL (ref 4.0–10.5)
nRBC: 0 % (ref 0.0–0.2)

## 2019-09-20 MED ORDER — SODIUM CHLORIDE 0.9 % IV SOLN
Freq: Once | INTRAVENOUS | Status: DC
  Filled 2019-09-20: qty 250

## 2019-09-20 MED ORDER — ACETAMINOPHEN 325 MG PO TABS
650.0000 mg | ORAL_TABLET | Freq: Once | ORAL | Status: AC
  Administered 2019-09-20: 650 mg via ORAL

## 2019-09-20 MED ORDER — DEXAMETHASONE 4 MG PO TABS
20.0000 mg | ORAL_TABLET | Freq: Once | ORAL | Status: AC
  Administered 2019-09-20: 20 mg via ORAL

## 2019-09-20 MED ORDER — DEXAMETHASONE 4 MG PO TABS
ORAL_TABLET | ORAL | Status: AC
  Filled 2019-09-20: qty 5

## 2019-09-20 MED ORDER — SODIUM CHLORIDE 0.9% FLUSH
10.0000 mL | INTRAVENOUS | Status: DC | PRN
  Administered 2019-09-20: 09:00:00 10 mL
  Filled 2019-09-20: qty 10

## 2019-09-20 MED ORDER — ACETAMINOPHEN 325 MG PO TABS
ORAL_TABLET | ORAL | Status: AC
  Filled 2019-09-20: qty 2

## 2019-09-20 MED ORDER — DIPHENHYDRAMINE HCL 25 MG PO CAPS
ORAL_CAPSULE | ORAL | Status: AC
  Filled 2019-09-20: qty 2

## 2019-09-20 MED ORDER — DIPHENHYDRAMINE HCL 25 MG PO CAPS
50.0000 mg | ORAL_CAPSULE | Freq: Once | ORAL | Status: AC
  Administered 2019-09-20: 50 mg via ORAL

## 2019-09-20 MED ORDER — DARATUMUMAB-HYALURONIDASE-FIHJ 1800-30000 MG-UT/15ML ~~LOC~~ SOLN
1800.0000 mg | Freq: Once | SUBCUTANEOUS | Status: AC
  Administered 2019-09-20: 1800 mg via SUBCUTANEOUS
  Filled 2019-09-20: qty 15

## 2019-09-20 MED ORDER — SODIUM CHLORIDE 0.9% FLUSH
10.0000 mL | INTRAVENOUS | Status: DC | PRN
  Administered 2019-09-20: 10 mL
  Filled 2019-09-20: qty 10

## 2019-09-20 MED ORDER — HEPARIN SOD (PORK) LOCK FLUSH 100 UNIT/ML IV SOLN
500.0000 [IU] | Freq: Once | INTRAVENOUS | Status: DC | PRN
  Filled 2019-09-20: qty 5

## 2019-09-20 MED ORDER — HEPARIN SOD (PORK) LOCK FLUSH 100 UNIT/ML IV SOLN
500.0000 [IU] | Freq: Once | INTRAVENOUS | Status: AC | PRN
  Administered 2019-09-20: 500 [IU]
  Filled 2019-09-20: qty 5

## 2019-09-20 NOTE — Patient Instructions (Signed)
Daratumumab injection What is this medicine? DARATUMUMAB (dar a toom ue mab) is a monoclonal antibody. It is used to treat multiple myeloma. This medicine may be used for other purposes; ask your health care provider or pharmacist if you have questions. COMMON BRAND NAME(S): DARZALEX What should I tell my health care provider before I take this medicine? They need to know if you have any of these conditions:  infection (especially a virus infection such as chickenpox, herpes, or hepatitis B virus)  lung or breathing disease  an unusual or allergic reaction to daratumumab, other medicines, foods, dyes, or preservatives  pregnant or trying to get pregnant  breast-feeding How should I use this medicine? This medicine is for infusion into a vein. It is given by a health care professional in a hospital or clinic setting. Talk to your pediatrician regarding the use of this medicine in children. Special care may be needed. Overdosage: If you think you have taken too much of this medicine contact a poison control center or emergency room at once. NOTE: This medicine is only for you. Do not share this medicine with others. What if I miss a dose? Keep appointments for follow-up doses as directed. It is important not to miss your dose. Call your doctor or health care professional if you are unable to keep an appointment. What may interact with this medicine? Interactions have not been studied. This list may not describe all possible interactions. Give your health care provider a list of all the medicines, herbs, non-prescription drugs, or dietary supplements you use. Also tell them if you smoke, drink alcohol, or use illegal drugs. Some items may interact with your medicine. What should I watch for while using this medicine? This drug may make you feel generally unwell. Report any side effects. Continue your course of treatment even though you feel ill unless your doctor tells you to stop. This  medicine can cause serious allergic reactions. To reduce your risk you may need to take medicine before treatment with this medicine. Take your medicine as directed. This medicine can affect the results of blood tests to match your blood type. These changes can last for up to 6 months after the final dose. Your healthcare provider will do blood tests to match your blood type before you start treatment. Tell all of your healthcare providers that you are being treated with this medicine before receiving a blood transfusion. This medicine can affect the results of some tests used to determine treatment response; extra tests may be needed to evaluate response. Do not become pregnant while taking this medicine or for 3 months after stopping it. Women should inform their doctor if they wish to become pregnant or think they might be pregnant. There is a potential for serious side effects to an unborn child. Talk to your health care professional or pharmacist for more information. What side effects may I notice from receiving this medicine? Side effects that you should report to your doctor or health care professional as soon as possible:  allergic reactions like skin rash, itching or hives, swelling of the face, lips, or tongue  breathing problems  chills  cough  dizziness  feeling faint or lightheaded  headache  low blood counts - this medicine may decrease the number of white blood cells, red blood cells and platelets. You may be at increased risk for infections and bleeding.  nausea, vomiting  shortness of breath  signs of decreased platelets or bleeding - bruising, pinpoint red spots on  the skin, black, tarry stools, blood in the urine  signs of decreased red blood cells - unusually weak or tired, feeling faint or lightheaded, falls  signs of infection - fever or chills, cough, sore throat, pain or difficulty passing urine  signs and symptoms of liver injury like dark yellow or brown  urine; general ill feeling or flu-like symptoms; light-colored stools; loss of appetite; right upper belly pain; unusually weak or tired; yellowing of the eyes or skin Side effects that usually do not require medical attention (report to your doctor or health care professional if they continue or are bothersome):  back pain  constipation  diarrhea  joint pain  muscle cramps  pain, tingling, numbness in the hands or feet  swelling of the ankles, feet, hands  tiredness  trouble sleeping This list may not describe all possible side effects. Call your doctor for medical advice about side effects. You may report side effects to FDA at 1-800-FDA-1088. Where should I keep my medicine? This drug is given in a hospital or clinic and will not be stored at home. NOTE: This sheet is a summary. It may not cover all possible information. If you have questions about this medicine, talk to your doctor, pharmacist, or health care provider.  2020 Elsevier/Gold Standard (2019-01-17 18:10:54)  

## 2019-09-20 NOTE — Progress Notes (Signed)
Hematology and Oncology Follow Up Visit  Robert [name] [id number] [birth year] 86 y.o. 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 3 weeks for hemoglobin less than 10   Interim History:  Robert [name] is here today for follow-up and treatment. He is doing well but has not been taking his lasix for a week. He has been taking the Zaroxolyn as prescribed.  The swelling in his legs has completely resolved. His weight is up 3 lbs so he will restart the lasix as prescribed.  He is tolerating Daratumumab nicely.  No M-spike observed in March, IgG level was 509 and kappa light chains 1.22 mg/dL.  No fever, chills, n/v, cough, rash, dizziness, SOB, chest pain, palpitations, abdominal pain or changes in bowel or bladder habits.  No episodes of bleeding. No bruising or petechiae.  No tenderness, numbness or tingling in his extremities at this time.  He has maintained a good appetite and is hydrating properly.   ECOG Performance Status: 1 - Symptomatic but completely ambulatory  Medications:  Allergies as of 09/20/2019   No Known Allergies     Medication List       Accurate as of September 20, 2019  9:43 AM. If you have any questions, ask your nurse or doctor.        STOP taking these medications   Zometa 4 MG/100ML IVPB Generic drug: Zoledronic Acid Stopped by: Laverna Peace, NP     TAKE these medications   aspirin 81 MG tablet Take 81 mg by mouth daily.   bimatoprost 0.01 % Soln Commonly known as: LUMIGAN Place 1 drop into both eyes at bedtime.   CALTRATE 600+D PLUS  PO Take 1 tablet by mouth daily.   carvedilol 12.5 MG tablet Commonly known as: COREG Take 1 tablet (12.5 mg total) by mouth 2 (two) times daily with a meal.   cloNIDine 0.1 MG tablet Commonly known as: CATAPRES Take 1 tablet (0.1 mg total) by mouth daily. Take an extra tablet as needed for blood pressure greater than 170/100 (x1 per day)   [employer] VITAMIN B12 2000 MCG tablet Generic drug: cyanocobalamin Take 2,000 mcg by mouth at bedtime.   famciclovir 250 MG tablet Commonly known as: FAMVIR TAKE 1 TABLET DAILY   furosemide 20 MG tablet Commonly known as: LASIX Take 2 tablets (40 mg total) by mouth daily.   HCA LAX-X PO Take by mouth every three (3) days as needed.   losartan 100 MG tablet Commonly known as: COZAAR Take 100 mg by mouth daily.   metolazone 5 MG tablet Commonly known as: ZAROXOLYN Take 1 pill 1 hour prior to taking Lasix daily   montelukast 10 MG tablet Commonly known as: Singulair Take 1 tablet (10 mg total) by mouth at bedtime.   multivitamin with minerals Tabs tablet Take 1 tablet by mouth daily.   potassium chloride SA 20 MEQ tablet Commonly known as: KLOR-CON Take 20 mEq by mouth daily.   pravastatin 20 MG tablet Commonly known as: PRAVACHOL Take 20 mg by mouth at bedtime.  SYSTANE OP Place 1 drop into both eyes daily as needed (dry eyes).   Vitamin D3 25 MCG (1000 UT) Caps Take 1,000 Units by mouth daily.       Allergies: No Known Allergies  Past Medical History, Surgical history, Social history, and Family History were reviewed and updated.  Review of Systems: All other 10 point review of systems is negative.   Physical Exam:  height is 5\' 11"  (1.803 m) and weight is 217 lb (98.4 kg). His temporal temperature is 97.1 F (36.2 C) (abnormal). His blood pressure is 172/82 (abnormal) and his pulse is 77. His respiration is 17 and oxygen saturation is 100%.   Wt Readings from Last 3 Encounters:  09/20/19 217 lb (98.4 kg)  09/20/19  217 lb (98.4 kg)  08/23/19 214 lb 0.6 oz (97.1 kg)    Ocular: Sclerae unicteric, pupils equal, round and reactive to light Ear-nose-throat: Oropharynx clear, dentition fair Lymphatic: No cervical or supraclavicular adenopathy Lungs no rales or rhonchi, good excursion bilaterally Heart regular rate and rhythm, no murmur appreciated Abd soft, nontender, positive bowel sounds, no liver or spleen tip palpated in exam, no fluid wave  MSK no focal spinal tenderness, no joint edema Neuro: non-focal, well-oriented, appropriate affect Breasts: Deferred   Lab Results  Component Value Date   WBC 5.4 09/20/2019   HGB 10.9 (L) 09/20/2019   HCT 32.3 (L) 09/20/2019   MCV 98.2 09/20/2019   PLT 173 09/20/2019   Lab Results  Component Value Date   FERRITIN 1,061 (H) 06/28/2019   IRON 82 06/28/2019   TIBC 281 06/28/2019   UIBC 200 06/28/2019   IRONPCTSAT 29 06/28/2019   Lab Results  Component Value Date   RETICCTPCT 2.2 04/05/2019   RBC 3.29 (L) 09/20/2019   Lab Results  Component Value Date   KPAFRELGTCHN 12.2 08/23/2019   LAMBDASER 7.5 08/23/2019   KAPLAMBRATIO 1.63 08/23/2019   Lab Results  Component Value Date   IGGSERUM 509 (L) 08/23/2019   IGA 18 (L) 08/23/2019   IGMSERUM 24 08/23/2019   Lab Results  Component Value Date   TOTALPROTELP 6.0 08/23/2019   ALBUMINELP 3.7 08/23/2019   A1GS 0.2 08/23/2019   A2GS 0.9 08/23/2019   BETS 0.8 08/23/2019   BETA2SER 0.3 01/03/2015   GAMS 0.5 08/23/2019   MSPIKE Not Observed 08/23/2019   SPEI Comment 01/11/2019     Chemistry      Component Value Date/Time   NA 138 08/23/2019 1132   NA 137 04/08/2017 1141   NA 138 10/29/2015 1029   K 3.6 08/23/2019 1132   K 3.4 04/08/2017 1141   K 3.9 10/29/2015 1029   CL 102 08/23/2019 1132   CL 102 04/08/2017 1141   CO2 29 08/23/2019 1132   CO2 30 04/08/2017 1141   CO2 27 10/29/2015 1029   BUN 28 (H) 08/23/2019 1132   BUN 16 04/08/2017 1141   BUN 13.8 10/29/2015 1029   CREATININE  1.30 (H) 08/23/2019 1132   CREATININE 1.3 (H) 04/08/2017 1141   CREATININE 1.1 10/29/2015 1029      Component Value Date/Time   CALCIUM 9.8 08/23/2019 1132   CALCIUM 9.3 04/08/2017 1141   CALCIUM 9.3 10/29/2015 1029   ALKPHOS 46 08/23/2019 1132   ALKPHOS 66 04/08/2017 1141   ALKPHOS 52 10/29/2015 1029   AST 15 08/23/2019 1132   AST 20 10/29/2015 1029   ALT 11 08/23/2019 1132   ALT 21 04/08/2017 1141   ALT 14 10/29/2015 1029  BILITOT 0.8 08/23/2019 1132   BILITOT 0.75 10/29/2015 1029       Impression and Plan: Robert [name] is a very pleasant 86 yo African American gentleman with relapsed IgG kappa myeloma. He has history of stem cell transplant in 2006.  He has tolerated Daratumumab well and has had a nice response. His counts remain stable.  We will proceed with treatment today as planned.  He will restart his lasix as prescribed.  We will see him back in another 4 weeks.  He will contact our office with any questions or concerns. We can certainly see him sooner if needed.   Laverna Peace, NP 4/28/20219:43 AM

## 2019-09-21 LAB — IGG, IGA, IGM
IgA: 18 mg/dL — ABNORMAL LOW (ref 61–437)
IgG (Immunoglobin G), Serum: 501 mg/dL — ABNORMAL LOW (ref 603–1613)
IgM (Immunoglobulin M), Srm: 26 mg/dL (ref 15–143)

## 2019-09-21 LAB — KAPPA/LAMBDA LIGHT CHAINS
Kappa free light chain: 11.9 mg/L (ref 3.3–19.4)
Kappa, lambda light chain ratio: 1.95 — ABNORMAL HIGH (ref 0.26–1.65)
Lambda free light chains: 6.1 mg/L (ref 5.7–26.3)

## 2019-09-26 LAB — PROTEIN ELECTROPHORESIS, SERUM, WITH REFLEX
A/G Ratio: 1.6 (ref 0.7–1.7)
Albumin ELP: 3.6 g/dL (ref 2.9–4.4)
Alpha-1-Globulin: 0.1 g/dL (ref 0.0–0.4)
Alpha-2-Globulin: 0.7 g/dL (ref 0.4–1.0)
Beta Globulin: 0.9 g/dL (ref 0.7–1.3)
Gamma Globulin: 0.5 g/dL (ref 0.4–1.8)
Globulin, Total: 2.2 g/dL (ref 2.2–3.9)
SPEP Interpretation: 0
Total Protein ELP: 5.8 g/dL — ABNORMAL LOW (ref 6.0–8.5)

## 2019-09-26 LAB — IMMUNOFIXATION REFLEX, SERUM
IgA: 17 mg/dL — ABNORMAL LOW (ref 61–437)
IgG (Immunoglobin G), Serum: 523 mg/dL — ABNORMAL LOW (ref 603–1613)
IgM (Immunoglobulin M), Srm: 24 mg/dL (ref 15–143)

## 2019-09-29 DIAGNOSIS — B356 Tinea cruris: Secondary | ICD-10-CM | POA: Diagnosis not present

## 2019-09-29 DIAGNOSIS — D225 Melanocytic nevi of trunk: Secondary | ICD-10-CM | POA: Diagnosis not present

## 2019-09-29 DIAGNOSIS — L918 Other hypertrophic disorders of the skin: Secondary | ICD-10-CM | POA: Diagnosis not present

## 2019-09-29 DIAGNOSIS — L720 Epidermal cyst: Secondary | ICD-10-CM | POA: Diagnosis not present

## 2019-09-29 DIAGNOSIS — L821 Other seborrheic keratosis: Secondary | ICD-10-CM | POA: Diagnosis not present

## 2019-10-10 ENCOUNTER — Ambulatory Visit: Payer: Medicare Other | Admitting: Cardiology

## 2019-10-10 DIAGNOSIS — H409 Unspecified glaucoma: Secondary | ICD-10-CM

## 2019-10-10 HISTORY — DX: Unspecified glaucoma: H40.9

## 2019-10-11 ENCOUNTER — Other Ambulatory Visit: Payer: Self-pay

## 2019-10-11 ENCOUNTER — Ambulatory Visit (INDEPENDENT_AMBULATORY_CARE_PROVIDER_SITE_OTHER): Payer: Medicare Other | Admitting: Cardiology

## 2019-10-11 ENCOUNTER — Encounter: Payer: Self-pay | Admitting: Cardiology

## 2019-10-11 VITALS — BP 140/90 | HR 74 | Ht 71.0 in | Wt 215.0 lb

## 2019-10-11 DIAGNOSIS — C9001 Multiple myeloma in remission: Secondary | ICD-10-CM

## 2019-10-11 DIAGNOSIS — I10 Essential (primary) hypertension: Secondary | ICD-10-CM | POA: Diagnosis not present

## 2019-10-11 DIAGNOSIS — E785 Hyperlipidemia, unspecified: Secondary | ICD-10-CM | POA: Diagnosis not present

## 2019-10-11 NOTE — Patient Instructions (Signed)

## 2019-10-11 NOTE — Progress Notes (Signed)
Cardiology Office Note:    Date:  10/11/2019   ID:  Robert [name], DOB 18-May-1934, MRN [id number]  PCP:  Leanna Battles, MD  Cardiologist:  Jenne Campus, MD    Referring MD: Leanna Battles, MD   Chief Complaint  Patient presents with  . Follow-up  Doing fine  History of Present Illness:    Robert WERNETTE is a 86 y.o. male with past medical history significant for multiple myeloma, likely in the remission, benign essential hypertension, dyslipidemia.  Comes today to my office for follow-up overall seems to be doing well.  He checked his blood pressure on the regular basis and it looks good.  Slightly elevated in the office today.  He is trying to be active and try to walk a little bit getting short of breath easily but nothing more than usual.  Past Medical History:  Diagnosis Date  . Anemia of chronic renal failure, stage 3 (moderate) 10/06/2018  . Arthritis   . B12 deficiency 02/17/2010  . Benign essential hypertension 03/19/2010  . Cerumen debris on tympanic membrane 11/28/2015  . Dyslipidemia 04/08/2015  . ED (erectile dysfunction) 06/02/2010  . 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 Glaucoma of both eyes 10/10/2019  . Goals of care, counseling/discussion 05/12/2018  . Groin lump 12/28/2016  . Hyperlipidemia   . Hypertension   . IgG multiple myeloma ([place]) 07/23/2004  . Iron deficiency anemia 10/07/2018  . 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 Lytic bone lesions on xray 03/17/2016  . Multiple myeloma ([place])    2006  . Multiple myeloma in remission ([place]) 03/19/2010  . Myeloma ([place]) 07/02/2011  . S/P autologous bone marrow transplantation ([place]) 04/29/2005  . Swelling of both lower extremities 10/18/2018  . Syncope 12/24/2017  . White coat syndrome with diagnosis of hypertension 11/28/2015    Past Surgical History:  Procedure Laterality Date  . CATARACT EXTRACTION BILATERAL W/ ANTERIOR VITRECTOMY    . IR IMAGING GUIDED PORT INSERTION  05/30/2018  . LIMBAL STEM CELL TRANSPLANT       Current Medications: Current Meds  Medication Sig  . aspirin 81 MG tablet Take 81 mg by mouth daily.  . bimatoprost (LUMIGAN) 0.01 % SOLN Place 1 drop into both eyes at bedtime.   . Calcium Carbonate-Vit D-Min (CALTRATE 600+D PLUS PO) Take 1 tablet by mouth daily.  . carvedilol (COREG) 12.5 MG tablet Take 1 tablet (12.5 mg total) by mouth 2 (two) times daily with a meal.  . Cholecalciferol (VITAMIN D3) 1000 UNITS CAPS Take 1,000 Units by mouth daily.   . cloNIDine (CATAPRES) 0.1 MG tablet Take 1 tablet (0.1 mg total) by mouth daily. Take an extra tablet as needed for blood pressure greater than 170/100 (x1 per day)  . cyanocobalamin ([employer] VITAMIN B12) 2000 MCG tablet Take 2,000 mcg by mouth at bedtime.   . famciclovir (FAMVIR) 250 MG tablet TAKE 1 TABLET DAILY  . furosemide (LASIX) 20 MG tablet Take 2 tablets (40 mg total) by mouth daily.  Marland Kitchen losartan (COZAAR) 100 MG tablet Take 100 mg by mouth daily.  . metolazone (ZAROXOLYN) 5 MG tablet Take 1 pill 1 hour prior to taking Lasix daily  . montelukast (SINGULAIR) 10 MG tablet Take 1 tablet (10 mg total) by mouth at bedtime.  . Multiple Vitamin (MULITIVITAMIN WITH MINERALS) TABS Take 1 tablet by mouth daily.  Vladimir Faster Glycol-Propyl Glycol (SYSTANE OP) Place 1 drop into both eyes daily as needed (dry eyes).   . potassium chloride SA (K-DUR,KLOR-CON) 20 MEQ tablet  Take 20 mEq by mouth daily.  . pravastatin (PRAVACHOL) 20 MG tablet Take 20 mg by mouth at bedtime.  . Sennosides (HCA LAX-X PO) Take by mouth every three (3) days as needed.     Allergies:   Patient has no known allergies.   Social History   Socioeconomic History  . Marital status: Married    Spouse name: Not on file  . Number of children: Not on file  . Years of education: Not on file  . Highest education level: Not on file  Occupational History  . Not on file  Tobacco Use  . Smoking status: Former Smoker    Packs/day: 4.00    Years: 9.00    Pack years: 36.00     Types: Cigars, Cigarettes    Start date: 07/08/1979    Quit date: 07/07/1988    Years since quitting: 31.2  . Smokeless tobacco: Never Used  . Tobacco comment: quit 24 years ago  Substance and Sexual Activity  . Alcohol use: Yes    Alcohol/week: 6.0 standard drinks    Types: 6 Glasses of wine per week    Comment: very seldom  . Drug use: No  . Sexual activity: Not on file  Other Topics Concern  . Not on file  Social History Narrative  . Not on file   Social Determinants of Health   Financial Resource Strain:   . Difficulty of Paying Living Expenses:   Food Insecurity:   . Worried About Charity fundraiser in the Last Year:   . Arboriculturist in the Last Year:   Transportation Needs:   . Film/video editor (Medical):   Marland Kitchen Lack of Transportation (Non-Medical):   Physical Activity:   . Days of Exercise per Week:   . Minutes of Exercise per Session:   Stress:   . Feeling of Stress :   Social Connections:   . Frequency of Communication with Friends and Family:   . Frequency of Social Gatherings with Friends and Family:   . Attends Religious Services:   . Active Member of Clubs or Organizations:   . Attends Archivist Meetings:   Marland Kitchen Marital Status:      Family History: The patient's family history includes Diabetes in his brother; Heart attack in his father; Heart disease in his father; Throat cancer in his mother. There is no history of Colon cancer. ROS:   Please see the history of present illness.    All 14 point review of systems negative except as described per history of present illness  EKGs/Labs/Other Studies Reviewed:      Recent Labs: 09/20/2019: ALT 11; BUN 24; Creatinine 1.29; Hemoglobin 10.9; Platelet Count 173; Potassium 3.7; Sodium 140  Recent Lipid Panel No results found for: CHOL, TRIG, HDL, CHOLHDL, VLDL, LDLCALC, LDLDIRECT  Physical Exam:    VS:  BP 140/90   Pulse 74   Ht _0  (1.803 m)   Wt 215 lb (97.5 kg)   SpO2 98%   BMI  29.99 kg/m     Wt Readings from Last 3 Encounters:  10/11/19 215 lb (97.5 kg)  09/20/19 217 lb (98.4 kg)  09/20/19 217 lb (98.4 kg)     GEN:  Well nourished, well developed in no acute distress HEENT: Normal NECK: No JVD; No carotid bruits LYMPHATICS: No lymphadenopathy CARDIAC: RRR, no murmurs, no rubs, no gallops RESPIRATORY:  Clear to auscultation without rales, wheezing or rhonchi  ABDOMEN: Soft, non-tender, non-distended MUSCULOSKELETAL:  No edema; No deformity  SKIN: Warm and dry LOWER EXTREMITIES: no swelling NEUROLOGIC:  Alert and oriented x 3 PSYCHIATRIC:  Normal affect   ASSESSMENT:    1. Benign essential hypertension   2. Multiple myeloma in remission ([place])   3. Dyslipidemia    PLAN:    In order of problems listed above:  1. Benign essential hypertension blood pressure appears to be well controlled we will continue present medications.  We discussed again the management of his medications he is doing quite good job taking medication on a regular basis.  Sometimes he postpone morning dose of medication his blood pressure is low. 2. Multiple myeloma it is taking care of excellently by our oncology team. 3. Dyslipidemia: He is on pravastatin 20 which I will continue.  I did review K PN and I see last fasting lipid profile from September 2020 with LDL of 114 HDL 49.  I will make arrangements for another fasting lipid profile.   Medication Adjustments/Labs and Tests Ordered: Current medicines are reviewed at length with the patient today.  Concerns regarding medicines are outlined above.  No orders of the defined types were placed in this encounter.  Medication changes: No orders of the defined types were placed in this encounter.   Signed, Park Liter, MD, [facility] 10/11/2019 3:31 PM    [place]

## 2019-10-18 ENCOUNTER — Inpatient Hospital Stay (HOSPITAL_BASED_OUTPATIENT_CLINIC_OR_DEPARTMENT_OTHER): Payer: Medicare Other | Admitting: Hematology & Oncology

## 2019-10-18 ENCOUNTER — Encounter: Payer: Self-pay | Admitting: Hematology & Oncology

## 2019-10-18 ENCOUNTER — Inpatient Hospital Stay: Payer: Medicare Other

## 2019-10-18 ENCOUNTER — Inpatient Hospital Stay: Payer: Medicare Other | Attending: Hematology & Oncology

## 2019-10-18 ENCOUNTER — Telehealth: Payer: Self-pay | Admitting: Hematology & Oncology

## 2019-10-18 ENCOUNTER — Other Ambulatory Visit: Payer: Self-pay

## 2019-10-18 VITALS — BP 150/85 | HR 63 | Temp 98.3°F | Resp 17 | Wt 217.0 lb

## 2019-10-18 VITALS — BP 155/74 | HR 54 | Resp 18

## 2019-10-18 DIAGNOSIS — Z79899 Other long term (current) drug therapy: Secondary | ICD-10-CM | POA: Insufficient documentation

## 2019-10-18 DIAGNOSIS — C9002 Multiple myeloma in relapse: Secondary | ICD-10-CM | POA: Insufficient documentation

## 2019-10-18 DIAGNOSIS — Z5112 Encounter for antineoplastic immunotherapy: Secondary | ICD-10-CM | POA: Diagnosis not present

## 2019-10-18 DIAGNOSIS — N183 Chronic kidney disease, stage 3 unspecified: Secondary | ICD-10-CM

## 2019-10-18 DIAGNOSIS — D630 Anemia in neoplastic disease: Secondary | ICD-10-CM | POA: Diagnosis not present

## 2019-10-18 DIAGNOSIS — C9001 Multiple myeloma in remission: Secondary | ICD-10-CM

## 2019-10-18 DIAGNOSIS — C9000 Multiple myeloma not having achieved remission: Secondary | ICD-10-CM

## 2019-10-18 LAB — CBC WITH DIFFERENTIAL (CANCER CENTER ONLY)
Abs Immature Granulocytes: 0.01 10*3/uL (ref 0.00–0.07)
Basophils Absolute: 0 10*3/uL (ref 0.0–0.1)
Basophils Relative: 0 %
Eosinophils Absolute: 0.2 10*3/uL (ref 0.0–0.5)
Eosinophils Relative: 4 %
HCT: 32.9 % — ABNORMAL LOW (ref 39.0–52.0)
Hemoglobin: 11 g/dL — ABNORMAL LOW (ref 13.0–17.0)
Immature Granulocytes: 0 %
Lymphocytes Relative: 35 %
Lymphs Abs: 1.8 10*3/uL (ref 0.7–4.0)
MCH: 32.8 pg (ref 26.0–34.0)
MCHC: 33.4 g/dL (ref 30.0–36.0)
MCV: 98.2 fL (ref 80.0–100.0)
Monocytes Absolute: 0.6 10*3/uL (ref 0.1–1.0)
Monocytes Relative: 11 %
Neutro Abs: 2.7 10*3/uL (ref 1.7–7.7)
Neutrophils Relative %: 50 %
Platelet Count: 175 10*3/uL (ref 150–400)
RBC: 3.35 MIL/uL — ABNORMAL LOW (ref 4.22–5.81)
RDW: 12.8 % (ref 11.5–15.5)
WBC Count: 5.3 10*3/uL (ref 4.0–10.5)
nRBC: 0 % (ref 0.0–0.2)

## 2019-10-18 LAB — CMP (CANCER CENTER ONLY)
ALT: 12 U/L (ref 0–44)
AST: 16 U/L (ref 15–41)
Albumin: 4.1 g/dL (ref 3.5–5.0)
Alkaline Phosphatase: 47 U/L (ref 38–126)
Anion gap: 6 (ref 5–15)
BUN: 22 mg/dL (ref 8–23)
CO2: 30 mmol/L (ref 22–32)
Calcium: 10.2 mg/dL (ref 8.9–10.3)
Chloride: 102 mmol/L (ref 98–111)
Creatinine: 1.23 mg/dL (ref 0.61–1.24)
GFR, Est AFR Am: >60 mL/min (ref >60)
GFR, Estimated: 54 mL/min — ABNORMAL LOW (ref >60)
Glucose, Bld: 112 mg/dL — ABNORMAL HIGH (ref 70–99)
Potassium: 3.8 mmol/L (ref 3.5–5.1)
Sodium: 138 mmol/L (ref 135–145)
Total Bilirubin: 0.9 mg/dL (ref 0.3–1.2)
Total Protein: 6.1 g/dL — ABNORMAL LOW (ref 6.5–8.1)

## 2019-10-18 MED ORDER — DIPHENHYDRAMINE HCL 25 MG PO CAPS
50.0000 mg | ORAL_CAPSULE | Freq: Once | ORAL | Status: AC
  Administered 2019-10-18: 50 mg via ORAL

## 2019-10-18 MED ORDER — ACETAMINOPHEN 325 MG PO TABS
ORAL_TABLET | ORAL | Status: AC
  Filled 2019-10-18: qty 2

## 2019-10-18 MED ORDER — DEXAMETHASONE 4 MG PO TABS
ORAL_TABLET | ORAL | Status: AC
  Filled 2019-10-18: qty 5

## 2019-10-18 MED ORDER — DARATUMUMAB-HYALURONIDASE-FIHJ 1800-30000 MG-UT/15ML ~~LOC~~ SOLN
1800.0000 mg | Freq: Once | SUBCUTANEOUS | Status: AC
  Administered 2019-10-18: 1800 mg via SUBCUTANEOUS
  Filled 2019-10-18: qty 15

## 2019-10-18 MED ORDER — HEPARIN SOD (PORK) LOCK FLUSH 100 UNIT/ML IV SOLN
500.0000 [IU] | Freq: Once | INTRAVENOUS | Status: AC | PRN
  Administered 2019-10-18: 500 [IU]
  Filled 2019-10-18: qty 5

## 2019-10-18 MED ORDER — DIPHENHYDRAMINE HCL 25 MG PO CAPS
ORAL_CAPSULE | ORAL | Status: AC
  Filled 2019-10-18: qty 2

## 2019-10-18 MED ORDER — DEXAMETHASONE 4 MG PO TABS
20.0000 mg | ORAL_TABLET | Freq: Once | ORAL | Status: AC
  Administered 2019-10-18: 20 mg via ORAL

## 2019-10-18 MED ORDER — ACETAMINOPHEN 325 MG PO TABS
650.0000 mg | ORAL_TABLET | Freq: Once | ORAL | Status: AC
  Administered 2019-10-18: 650 mg via ORAL

## 2019-10-18 MED ORDER — SODIUM CHLORIDE 0.9% FLUSH
10.0000 mL | INTRAVENOUS | Status: DC | PRN
  Administered 2019-10-18: 10 mL
  Filled 2019-10-18: qty 10

## 2019-10-18 NOTE — Patient Instructions (Signed)
Daratumumab injection What is this medicine? DARATUMUMAB (dar a toom ue mab) is a monoclonal antibody. It is used to treat multiple myeloma. This medicine may be used for other purposes; ask your health care provider or pharmacist if you have questions. COMMON BRAND NAME(S): DARZALEX What should I tell my health care provider before I take this medicine? They need to know if you have any of these conditions:  infection (especially a virus infection such as chickenpox, herpes, or hepatitis B virus)  lung or breathing disease  an unusual or allergic reaction to daratumumab, other medicines, foods, dyes, or preservatives  pregnant or trying to get pregnant  breast-feeding How should I use this medicine? This medicine is for infusion into a vein. It is given by a health care professional in a hospital or clinic setting. Talk to your pediatrician regarding the use of this medicine in children. Special care may be needed. Overdosage: If you think you have taken too much of this medicine contact a poison control center or emergency room at once. NOTE: This medicine is only for you. Do not share this medicine with others. What if I miss a dose? Keep appointments for follow-up doses as directed. It is important not to miss your dose. Call your doctor or health care professional if you are unable to keep an appointment. What may interact with this medicine? Interactions have not been studied. This list may not describe all possible interactions. Give your health care provider a list of all the medicines, herbs, non-prescription drugs, or dietary supplements you use. Also tell them if you smoke, drink alcohol, or use illegal drugs. Some items may interact with your medicine. What should I watch for while using this medicine? This drug may make you feel generally unwell. Report any side effects. Continue your course of treatment even though you feel ill unless your doctor tells you to stop. This  medicine can cause serious allergic reactions. To reduce your risk you may need to take medicine before treatment with this medicine. Take your medicine as directed. This medicine can affect the results of blood tests to match your blood type. These changes can last for up to 6 months after the final dose. Your healthcare provider will do blood tests to match your blood type before you start treatment. Tell all of your healthcare providers that you are being treated with this medicine before receiving a blood transfusion. This medicine can affect the results of some tests used to determine treatment response; extra tests may be needed to evaluate response. Do not become pregnant while taking this medicine or for 3 months after stopping it. Women should inform their doctor if they wish to become pregnant or think they might be pregnant. There is a potential for serious side effects to an unborn child. Talk to your health care professional or pharmacist for more information. What side effects may I notice from receiving this medicine? Side effects that you should report to your doctor or health care professional as soon as possible:  allergic reactions like skin rash, itching or hives, swelling of the face, lips, or tongue  breathing problems  chills  cough  dizziness  feeling faint or lightheaded  headache  low blood counts - this medicine may decrease the number of white blood cells, red blood cells and platelets. You may be at increased risk for infections and bleeding.  nausea, vomiting  shortness of breath  signs of decreased platelets or bleeding - bruising, pinpoint red spots on  the skin, black, tarry stools, blood in the urine  signs of decreased red blood cells - unusually weak or tired, feeling faint or lightheaded, falls  signs of infection - fever or chills, cough, sore throat, pain or difficulty passing urine  signs and symptoms of liver injury like dark yellow or brown  urine; general ill feeling or flu-like symptoms; light-colored stools; loss of appetite; right upper belly pain; unusually weak or tired; yellowing of the eyes or skin Side effects that usually do not require medical attention (report to your doctor or health care professional if they continue or are bothersome):  back pain  constipation  diarrhea  joint pain  muscle cramps  pain, tingling, numbness in the hands or feet  swelling of the ankles, feet, hands  tiredness  trouble sleeping This list may not describe all possible side effects. Call your doctor for medical advice about side effects. You may report side effects to FDA at 1-800-FDA-1088. Where should I keep my medicine? This drug is given in a hospital or clinic and will not be stored at home. NOTE: This sheet is a summary. It may not cover all possible information. If you have questions about this medicine, talk to your doctor, pharmacist, or health care provider.  2020 Elsevier/Gold Standard (2019-01-17 18:10:54)

## 2019-10-18 NOTE — Progress Notes (Signed)
Hematology and Oncology Follow Up Visit  Robert [name] [id number] 1933/09/19 86 y.o. 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 3 weeks for hemoglobin less than 10   Interim History:  Robert [name] is here today for follow-up and treatment.  He really looks great.  He has had no problems since we last saw him.  He has had no issues with respect to his blood pressure.  There is no swelling in his legs.  His last myeloma studies back in April did not show monoclonal spike in his blood.  His IgG level was 500 mg/dL.  The kappa light chain was 1.9 mg/dL.  There has been no issues with fever.  He has had no nausea or vomiting.  There is been no change in bowel or bladder habits.  He has had no problems with rashes.  He has had no headache.  Over Memorial Day weekend, he is going back home to [place], [place].  Overall, his performance status is ECOG 1.   Medications:  Allergies as of 10/18/2019   No Known Allergies     Medication List       Accurate as of Oct 18, 2019  9:11 AM. If you have any questions, ask your nurse or doctor.        aspirin 81 MG tablet Take 81 mg by mouth daily.   bimatoprost 0.01 % Soln Commonly known as: LUMIGAN Place 1 drop into both eyes at bedtime.   CALTRATE 600+D PLUS PO Take 1 tablet by mouth daily.   carvedilol 12.5 MG tablet Commonly known as: COREG Take 1 tablet (12.5 mg total) by mouth 2 (two) times daily with a meal.   cloNIDine 0.1 MG tablet Commonly known as: CATAPRES Take 1 tablet (0.1 mg total) by mouth daily.  Take an extra tablet as needed for blood pressure greater than 170/100 (x1 per day)   [employer] VITAMIN B12 2000 MCG tablet Generic drug: cyanocobalamin Take 2,000 mcg by mouth at bedtime.   famciclovir 250 MG tablet Commonly known as: FAMVIR TAKE 1 TABLET DAILY   furosemide 20 MG tablet Commonly known as: LASIX Take 2 tablets (40 mg total) by mouth daily.   HCA LAX-X PO Take by mouth every three (3) days as needed.   losartan 100 MG tablet Commonly known as: COZAAR Take 100 mg by mouth daily.   metolazone 5 MG tablet Commonly known as: ZAROXOLYN Take 1 pill 1 hour prior to taking Lasix daily   montelukast 10 MG tablet Commonly known as: Singulair Take 1 tablet (10 mg total) by mouth at bedtime.   multivitamin with minerals Tabs tablet Take 1 tablet by mouth daily.   potassium chloride SA 20 MEQ tablet Commonly known as: KLOR-CON Take 20 mEq by mouth daily.   pravastatin 20 MG tablet Commonly known as: PRAVACHOL Take 20 mg by mouth at bedtime.   SYSTANE OP Place 1 drop into both eyes daily as needed (dry eyes).   Vitamin D3 25 MCG (1000 UT) Caps Take 1,000 Units by mouth daily.  Allergies: No Known Allergies  Past Medical History, Surgical history, Social history, and Family History were reviewed and updated.  Review of Systems: Review of Systems  Constitutional: Negative.   HENT: Negative.   Eyes: Negative.   Respiratory: Negative.   Cardiovascular: Negative.   Gastrointestinal: Negative.   Genitourinary: Negative.   Musculoskeletal: Negative.   Skin: Negative.   Neurological: Negative.   Endo/Heme/Allergies: Negative.   Psychiatric/Behavioral: Negative.       Physical Exam:  vitals were not taken for this visit.   Wt Readings from Last 3 Encounters:  10/11/19 215 lb (97.5 kg)  09/20/19 217 lb (98.4 kg)  09/20/19 217 lb (98.4 kg)    Physical Exam Vitals reviewed.  HENT:     Head: Normocephalic and atraumatic.  Eyes:     Pupils:  Pupils are equal, round, and reactive to light.  Cardiovascular:     Rate and Rhythm: Normal rate and regular rhythm.     Heart sounds: Normal heart sounds.  Pulmonary:     Effort: Pulmonary effort is normal.     Breath sounds: Normal breath sounds.  Abdominal:     General: Bowel sounds are normal.     Palpations: Abdomen is soft.  Musculoskeletal:        General: No tenderness or deformity. Normal range of motion.     Cervical back: Normal range of motion.  Lymphadenopathy:     Cervical: No cervical adenopathy.  Skin:    General: Skin is warm and dry.     Findings: No erythema or rash.  Neurological:     Mental Status: He is alert and oriented to person, place, and time.  Psychiatric:        Behavior: Behavior normal.        Thought Content: Thought content normal.        Judgment: Judgment normal.      Lab Results  Component Value Date   WBC 5.3 10/18/2019   HGB 11.0 (L) 10/18/2019   HCT 32.9 (L) 10/18/2019   MCV 98.2 10/18/2019   PLT 175 10/18/2019   Lab Results  Component Value Date   FERRITIN 1,061 (H) 06/28/2019   IRON 82 06/28/2019   TIBC 281 06/28/2019   UIBC 200 06/28/2019   IRONPCTSAT 29 06/28/2019   Lab Results  Component Value Date   RETICCTPCT 2.2 04/05/2019   RBC 3.35 (L) 10/18/2019   Lab Results  Component Value Date   KPAFRELGTCHN 11.9 09/20/2019   LAMBDASER 6.1 09/20/2019   KAPLAMBRATIO 1.95 (H) 09/20/2019   Lab Results  Component Value Date   IGGSERUM 501 (L) 09/20/2019   IGGSERUM 523 (L) 09/20/2019   IGA 18 (L) 09/20/2019   IGA 17 (L) 09/20/2019   IGMSERUM 26 09/20/2019   IGMSERUM 24 09/20/2019   Lab Results  Component Value Date   TOTALPROTELP 5.8 (L) 09/20/2019   ALBUMINELP 3.6 09/20/2019   A1GS 0.1 09/20/2019   A2GS 0.7 09/20/2019   BETS 0.9 09/20/2019   BETA2SER 0.3 01/03/2015   GAMS 0.5 09/20/2019   MSPIKE Not Observed 09/20/2019   SPEI Comment 01/11/2019     Chemistry      Component Value Date/Time   NA 140  09/20/2019 0913   NA 137 04/08/2017 1141   NA 138 10/29/2015 1029   K 3.7 09/20/2019 0913   K 3.4 04/08/2017 1141   K 3.9 10/29/2015 1029   CL 105 09/20/2019 0913   CL 102 04/08/2017 1141   CO2 29 09/20/2019 0913  CO2 30 04/08/2017 1141   CO2 27 10/29/2015 1029   BUN 24 (H) 09/20/2019 0913   BUN 16 04/08/2017 1141   BUN 13.8 10/29/2015 1029   CREATININE 1.29 (H) 09/20/2019 0913   CREATININE 1.3 (H) 04/08/2017 1141   CREATININE 1.1 10/29/2015 1029      Component Value Date/Time   CALCIUM 10.0 09/20/2019 0913   CALCIUM 9.3 04/08/2017 1141   CALCIUM 9.3 10/29/2015 1029   ALKPHOS 47 09/20/2019 0913   ALKPHOS 66 04/08/2017 1141   ALKPHOS 52 10/29/2015 1029   AST 15 09/20/2019 0913   AST 20 10/29/2015 1029   ALT 11 09/20/2019 0913   ALT 21 04/08/2017 1141   ALT 14 10/29/2015 1029   BILITOT 0.8 09/20/2019 0913   BILITOT 0.75 10/29/2015 1029       Impression and Plan: Mr. Gehman is a very pleasant 86 yo African American gentleman with relapsed IgG kappa myeloma. He has history of stem cell transplant in 2006.   He has tolerated Daratumumab well and has had a nice response.  For right now, there will not be any changes in his protocol.  If he continues to have no obvious myeloma spike in his blood, then we will try to move his appointments out to every 6 weeks.     Volanda Napoleon, MD 5/26/20219:11 AM

## 2019-10-18 NOTE — Patient Instructions (Signed)
Implanted Port Insertion, Care After °This sheet gives you information about how to care for yourself after your procedure. Your health care provider may also give you more specific instructions. If you have problems or questions, contact your health care provider. °What can I expect after the procedure? °After the procedure, it is common to have: °· Discomfort at the port insertion site. °· Bruising on the skin over the port. This should improve over 3-4 days. °Follow these instructions at home: °Port care °· After your port is placed, you will get a manufacturer's information card. The card has information about your port. Keep this card with you at all times. °· Take care of the port as told by your health care provider. Ask your health care provider if you or a family member can get training for taking care of the port at home. A home health care nurse may also take care of the port. °· Make sure to remember what type of port you have. °Incision care ° °  ° °· Follow instructions from your health care provider about how to take care of your port insertion site. Make sure you: °? Wash your hands with soap and water before and after you change your bandage (dressing). If soap and water are not available, use hand sanitizer. °? Change your dressing as told by your health care provider. °? Leave stitches (sutures), skin glue, or adhesive strips in place. These skin closures may need to stay in place for 2 weeks or longer. If adhesive strip edges start to loosen and curl up, you may trim the loose edges. Do not remove adhesive strips completely unless your health care provider tells you to do that. °· Check your port insertion site every day for signs of infection. Check for: °? Redness, swelling, or pain. °? Fluid or blood. °? Warmth. °? Pus or a bad smell. °Activity °· Return to your normal activities as told by your health care provider. Ask your health care provider what activities are safe for you. °· Do not  lift anything that is heavier than 10 lb (4.5 kg), or the limit that you are told, until your health care provider says that it is safe. °General instructions °· Take over-the-counter and prescription medicines only as told by your health care provider. °· Do not take baths, swim, or use a hot tub until your health care provider approves. Ask your health care provider if you may take showers. You may only be allowed to take sponge baths. °· Do not drive for 24 hours if you were given a sedative during your procedure. °· Wear a medical alert bracelet in case of an emergency. This will tell any health care providers that you have a port. °· Keep all follow-up visits as told by your health care provider. This is important. °Contact a health care provider if: °· You cannot flush your port with saline as directed, or you cannot draw blood from the port. °· You have a fever or chills. °· You have redness, swelling, or pain around your port insertion site. °· You have fluid or blood coming from your port insertion site. °· Your port insertion site feels warm to the touch. °· You have pus or a bad smell coming from the port insertion site. °Get help right away if: °· You have chest pain or shortness of breath. °· You have bleeding from your port that you cannot control. °Summary °· Take care of the port as told by your health   care provider. Keep the manufacturer's information card with you at all times. °· Change your dressing as told by your health care provider. °· Contact a health care provider if you have a fever or chills or if you have redness, swelling, or pain around your port insertion site. °· Keep all follow-up visits as told by your health care provider. °This information is not intended to replace advice given to you by your health care provider. Make sure you discuss any questions you have with your health care provider. °Document Revised: 12/07/2017 Document Reviewed: 12/07/2017 °Elsevier Patient Education ©  2020 [employer] ° °

## 2019-10-18 NOTE — Telephone Encounter (Signed)
Appointments scheduled calendar printed per 5/26 los 

## 2019-10-19 LAB — PROTEIN ELECTROPHORESIS, SERUM
A/G Ratio: 1.6 (ref 0.7–1.7)
Albumin ELP: 3.5 g/dL (ref 2.9–4.4)
Alpha-1-Globulin: 0.1 g/dL (ref 0.0–0.4)
Alpha-2-Globulin: 0.7 g/dL (ref 0.4–1.0)
Beta Globulin: 0.9 g/dL (ref 0.7–1.3)
Gamma Globulin: 0.4 g/dL (ref 0.4–1.8)
Globulin, Total: 2.2 g/dL (ref 2.2–3.9)
Total Protein ELP: 5.7 g/dL — ABNORMAL LOW (ref 6.0–8.5)

## 2019-10-19 LAB — KAPPA/LAMBDA LIGHT CHAINS
Kappa free light chain: 10.5 mg/L (ref 3.3–19.4)
Kappa, lambda light chain ratio: 1.06 (ref 0.26–1.65)
Lambda free light chains: 9.9 mg/L (ref 5.7–26.3)

## 2019-10-19 LAB — IGG, IGA, IGM
IgA: 19 mg/dL — ABNORMAL LOW (ref 61–437)
IgG (Immunoglobin G), Serum: 496 mg/dL — ABNORMAL LOW (ref 603–1613)
IgM (Immunoglobulin M), Srm: 36 mg/dL (ref 15–143)

## 2019-11-14 ENCOUNTER — Inpatient Hospital Stay: Payer: Medicare Other | Attending: Hematology & Oncology

## 2019-11-14 ENCOUNTER — Inpatient Hospital Stay: Payer: Medicare Other

## 2019-11-14 ENCOUNTER — Other Ambulatory Visit: Payer: Self-pay

## 2019-11-14 ENCOUNTER — Encounter: Payer: Self-pay | Admitting: Hematology & Oncology

## 2019-11-14 ENCOUNTER — Inpatient Hospital Stay (HOSPITAL_BASED_OUTPATIENT_CLINIC_OR_DEPARTMENT_OTHER): Payer: Medicare Other | Admitting: Hematology & Oncology

## 2019-11-14 DIAGNOSIS — N183 Chronic kidney disease, stage 3 unspecified: Secondary | ICD-10-CM

## 2019-11-14 DIAGNOSIS — Z5112 Encounter for antineoplastic immunotherapy: Secondary | ICD-10-CM | POA: Diagnosis present

## 2019-11-14 DIAGNOSIS — Z95828 Presence of other vascular implants and grafts: Secondary | ICD-10-CM

## 2019-11-14 DIAGNOSIS — C9002 Multiple myeloma in relapse: Secondary | ICD-10-CM | POA: Diagnosis present

## 2019-11-14 DIAGNOSIS — D631 Anemia in chronic kidney disease: Secondary | ICD-10-CM

## 2019-11-14 DIAGNOSIS — Z79899 Other long term (current) drug therapy: Secondary | ICD-10-CM | POA: Diagnosis not present

## 2019-11-14 DIAGNOSIS — C9001 Multiple myeloma in remission: Secondary | ICD-10-CM

## 2019-11-14 DIAGNOSIS — C9000 Multiple myeloma not having achieved remission: Secondary | ICD-10-CM | POA: Diagnosis not present

## 2019-11-14 LAB — CMP (CANCER CENTER ONLY)
ALT: 13 U/L (ref 0–44)
AST: 16 U/L (ref 15–41)
Albumin: 4.1 g/dL (ref 3.5–5.0)
Alkaline Phosphatase: 55 U/L (ref 38–126)
Anion gap: 6 (ref 5–15)
BUN: 23 mg/dL (ref 8–23)
CO2: 30 mmol/L (ref 22–32)
Calcium: 9.8 mg/dL (ref 8.9–10.3)
Chloride: 102 mmol/L (ref 98–111)
Creatinine: 1.3 mg/dL — ABNORMAL HIGH (ref 0.61–1.24)
GFR, Est AFR Am: 58 mL/min — ABNORMAL LOW (ref >60)
GFR, Estimated: 50 mL/min — ABNORMAL LOW (ref >60)
Glucose, Bld: 113 mg/dL — ABNORMAL HIGH (ref 70–99)
Potassium: 3.4 mmol/L — ABNORMAL LOW (ref 3.5–5.1)
Sodium: 138 mmol/L (ref 135–145)
Total Bilirubin: 0.6 mg/dL (ref 0.3–1.2)
Total Protein: 6.2 g/dL — ABNORMAL LOW (ref 6.5–8.1)

## 2019-11-14 LAB — CBC WITH DIFFERENTIAL (CANCER CENTER ONLY)
Abs Immature Granulocytes: 0.02 10*3/uL (ref 0.00–0.07)
Basophils Absolute: 0 10*3/uL (ref 0.0–0.1)
Basophils Relative: 0 %
Eosinophils Absolute: 0.1 10*3/uL (ref 0.0–0.5)
Eosinophils Relative: 2 %
HCT: 33.3 % — ABNORMAL LOW (ref 39.0–52.0)
Hemoglobin: 11.2 g/dL — ABNORMAL LOW (ref 13.0–17.0)
Immature Granulocytes: 0 %
Lymphocytes Relative: 30 %
Lymphs Abs: 1.5 10*3/uL (ref 0.7–4.0)
MCH: 32.8 pg (ref 26.0–34.0)
MCHC: 33.6 g/dL (ref 30.0–36.0)
MCV: 97.7 fL (ref 80.0–100.0)
Monocytes Absolute: 0.6 10*3/uL (ref 0.1–1.0)
Monocytes Relative: 11 %
Neutro Abs: 2.9 10*3/uL (ref 1.7–7.7)
Neutrophils Relative %: 57 %
Platelet Count: 174 10*3/uL (ref 150–400)
RBC: 3.41 MIL/uL — ABNORMAL LOW (ref 4.22–5.81)
RDW: 12.7 % (ref 11.5–15.5)
WBC Count: 5.2 10*3/uL (ref 4.0–10.5)
nRBC: 0 % (ref 0.0–0.2)

## 2019-11-14 LAB — SAMPLE TO BLOOD BANK

## 2019-11-14 MED ORDER — DIPHENHYDRAMINE HCL 25 MG PO CAPS
ORAL_CAPSULE | ORAL | Status: AC
  Filled 2019-11-14: qty 2

## 2019-11-14 MED ORDER — DEXAMETHASONE 4 MG PO TABS
20.0000 mg | ORAL_TABLET | Freq: Once | ORAL | Status: AC
  Administered 2019-11-14: 20 mg via ORAL

## 2019-11-14 MED ORDER — DIPHENHYDRAMINE HCL 25 MG PO CAPS
50.0000 mg | ORAL_CAPSULE | Freq: Once | ORAL | Status: AC
  Administered 2019-11-14: 50 mg via ORAL

## 2019-11-14 MED ORDER — SODIUM CHLORIDE 0.9 % IV SOLN
Freq: Once | INTRAVENOUS | Status: DC
  Filled 2019-11-14: qty 250

## 2019-11-14 MED ORDER — SODIUM CHLORIDE 0.9% FLUSH
10.0000 mL | Freq: Once | INTRAVENOUS | Status: DC
  Filled 2019-11-14: qty 10

## 2019-11-14 MED ORDER — DEXAMETHASONE 4 MG PO TABS
ORAL_TABLET | ORAL | Status: AC
  Filled 2019-11-14: qty 5

## 2019-11-14 MED ORDER — HEPARIN SOD (PORK) LOCK FLUSH 100 UNIT/ML IV SOLN
500.0000 [IU] | Freq: Once | INTRAVENOUS | Status: DC
  Filled 2019-11-14: qty 5

## 2019-11-14 MED ORDER — ACETAMINOPHEN 325 MG PO TABS
ORAL_TABLET | ORAL | Status: AC
  Filled 2019-11-14: qty 2

## 2019-11-14 MED ORDER — DARATUMUMAB-HYALURONIDASE-FIHJ 1800-30000 MG-UT/15ML ~~LOC~~ SOLN
1800.0000 mg | Freq: Once | SUBCUTANEOUS | Status: AC
  Administered 2019-11-14: 1800 mg via SUBCUTANEOUS
  Filled 2019-11-14: qty 15

## 2019-11-14 MED ORDER — ACETAMINOPHEN 325 MG PO TABS
650.0000 mg | ORAL_TABLET | Freq: Once | ORAL | Status: AC
  Administered 2019-11-14: 650 mg via ORAL

## 2019-11-14 NOTE — Progress Notes (Signed)
Hematology and Oncology Follow Up Visit  Robert [name] [id number] 1933/12/02 86 y.o. 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 3 weeks for hemoglobin less than 10   Interim History:  [name] is here today for follow-up and treatment.  He really looks great.  He has had no problems since we last saw him.  He has had no issues with respect to his blood pressure.  There is no swelling in his legs.  His last myeloma studies back in May did not show monoclonal spike in his blood.  His IgG level was 500 mg/dL.  The kappa light chain was 1.0 mg/dL.  There has been no issues with fever.  He has had no nausea or vomiting.  There is been no change in bowel or bladder habits.  He has had no problems with rashes.  He has had no headache.  Overall, his performance status is ECOG 1.   Medications:  Allergies as of 11/14/2019   No Known Allergies     Medication List       Accurate as of November 14, 2019 11:28 AM. If you have any questions, ask your nurse or doctor.        aspirin 81 MG tablet Take 81 mg by mouth daily.   bimatoprost 0.01 % Soln Commonly known as: LUMIGAN Place 1 drop into both eyes at bedtime.   CALTRATE 600+D PLUS PO Take 1 tablet by mouth daily.   carvedilol 12.5 MG tablet Commonly known as: COREG Take 1 tablet (12.5 mg total) by mouth 2 (two) times daily with a meal.   ciclopirox 0.77 % cream Commonly known as: LOPROX APPLY CREAM TOPICALLY ONCE DAILY   cloNIDine 0.1 MG tablet Commonly known as: CATAPRES Take 1 tablet (0.1 mg total) by mouth  daily. Take an extra tablet as needed for blood pressure greater than 170/100 (x1 per day)   [employer] VITAMIN B12 2000 MCG tablet Generic drug: cyanocobalamin Take 2,000 mcg by mouth at bedtime.   famciclovir 250 MG tablet Commonly known as: FAMVIR TAKE 1 TABLET DAILY   furosemide 20 MG tablet Commonly known as: LASIX Take 2 tablets (40 mg total) by mouth daily.   HCA LAX-X PO Take by mouth every three (3) days as needed.   losartan 100 MG tablet Commonly known as: COZAAR Take 100 mg by mouth daily.   metolazone 5 MG tablet Commonly known as: ZAROXOLYN Take 1 pill 1 hour prior to taking Lasix daily   montelukast 10 MG tablet Commonly known as: Singulair Take 1 tablet (10 mg total) by mouth at bedtime.   multivitamin with minerals Tabs tablet Take 1 tablet by mouth daily.   potassium chloride SA 20 MEQ tablet Commonly known as: KLOR-CON Take 20 mEq by mouth daily.   pravastatin 20 MG tablet Commonly known as: PRAVACHOL Take 20 mg by mouth at bedtime.   SYSTANE OP Place 1 drop into both eyes daily as needed (dry eyes).   Vitamin D3 25 MCG (1000 UT) Caps Take 1,000 Units by mouth daily.  Allergies: No Known Allergies  Past Medical History, Surgical history, Social history, and Family History were reviewed and updated.  Review of Systems: Review of Systems  Constitutional: Negative.   HENT: Negative.   Eyes: Negative.   Respiratory: Negative.   Cardiovascular: Negative.   Gastrointestinal: Negative.   Genitourinary: Negative.   Musculoskeletal: Negative.   Skin: Negative.   Neurological: Negative.   Endo/Heme/Allergies: Negative.   Psychiatric/Behavioral: Negative.       Physical Exam:  vitals were not taken for this visit.   Wt Readings from Last 3 Encounters:  11/14/19 215 lb 1.9 oz (97.6 kg)  10/18/19 217 lb (98.4 kg)  10/11/19 215 lb (97.5 kg)    Physical Exam Vitals reviewed.  HENT:     Head: Normocephalic and atraumatic.  Eyes:      Pupils: Pupils are equal, round, and reactive to light.  Cardiovascular:     Rate and Rhythm: Normal rate and regular rhythm.     Heart sounds: Normal heart sounds.  Pulmonary:     Effort: Pulmonary effort is normal.     Breath sounds: Normal breath sounds.  Abdominal:     General: Bowel sounds are normal.     Palpations: Abdomen is soft.  Musculoskeletal:        General: No tenderness or deformity. Normal range of motion.     Cervical back: Normal range of motion.  Lymphadenopathy:     Cervical: No cervical adenopathy.  Skin:    General: Skin is warm and dry.     Findings: No erythema or rash.  Neurological:     Mental Status: He is alert and oriented to person, place, and time.  Psychiatric:        Behavior: Behavior normal.        Thought Content: Thought content normal.        Judgment: Judgment normal.      Lab Results  Component Value Date   WBC 5.2 11/14/2019   HGB 11.2 (L) 11/14/2019   HCT 33.3 (L) 11/14/2019   MCV 97.7 11/14/2019   PLT 174 11/14/2019   Lab Results  Component Value Date   FERRITIN 1,061 (H) 06/28/2019   IRON 82 06/28/2019   TIBC 281 06/28/2019   UIBC 200 06/28/2019   IRONPCTSAT 29 06/28/2019   Lab Results  Component Value Date   RETICCTPCT 2.2 04/05/2019   RBC 3.41 (L) 11/14/2019   Lab Results  Component Value Date   KPAFRELGTCHN 10.5 10/18/2019   LAMBDASER 9.9 10/18/2019   KAPLAMBRATIO 1.06 10/18/2019   Lab Results  Component Value Date   IGGSERUM 496 (L) 10/18/2019   IGA 19 (L) 10/18/2019   IGMSERUM 36 10/18/2019   Lab Results  Component Value Date   TOTALPROTELP 5.7 (L) 10/18/2019   ALBUMINELP 3.5 10/18/2019   A1GS 0.1 10/18/2019   A2GS 0.7 10/18/2019   BETS 0.9 10/18/2019   BETA2SER 0.3 01/03/2015   GAMS 0.4 10/18/2019   MSPIKE Not Observed 10/18/2019   SPEI Comment 10/18/2019     Chemistry      Component Value Date/Time   NA 138 11/14/2019 1046   NA 137 04/08/2017 1141   NA 138 10/29/2015 1029   K 3.4 (L)  11/14/2019 1046   K 3.4 04/08/2017 1141   K 3.9 10/29/2015 1029   CL 102 11/14/2019 1046   CL 102 04/08/2017 1141   CO2 30 11/14/2019 1046   CO2 30 04/08/2017 1141   CO2 27 10/29/2015 1029   BUN  23 11/14/2019 1046   BUN 16 04/08/2017 1141   BUN 13.8 10/29/2015 1029   CREATININE 1.30 (H) 11/14/2019 1046   CREATININE 1.3 (H) 04/08/2017 1141   CREATININE 1.1 10/29/2015 1029      Component Value Date/Time   CALCIUM 9.8 11/14/2019 1046   CALCIUM 9.3 04/08/2017 1141   CALCIUM 9.3 10/29/2015 1029   ALKPHOS 55 11/14/2019 1046   ALKPHOS 66 04/08/2017 1141   ALKPHOS 52 10/29/2015 1029   AST 16 11/14/2019 1046   AST 20 10/29/2015 1029   ALT 13 11/14/2019 1046   ALT 21 04/08/2017 1141   ALT 14 10/29/2015 1029   BILITOT 0.6 11/14/2019 1046   BILITOT 0.75 10/29/2015 1029       Impression and Plan: Mr. Scibilia is a very pleasant 86 yo African American gentleman with relapsed IgG kappa myeloma. He has history of stem cell transplant in 2006.   He has tolerated Daratumumab well and has had a nice response.  For right now, there will not be any changes in his protocol.  If he continues to have no obvious myeloma spike in his blood, then we will try to move his appointments out to every 6 weeks.     Volanda Napoleon, MD 6/22/202111:28 AM

## 2019-11-14 NOTE — Patient Instructions (Signed)

## 2019-11-15 LAB — IGG, IGA, IGM
IgA: 19 mg/dL — ABNORMAL LOW (ref 61–437)
IgG (Immunoglobin G), Serum: 511 mg/dL — ABNORMAL LOW (ref 603–1613)
IgM (Immunoglobulin M), Srm: 30 mg/dL (ref 15–143)

## 2019-11-17 LAB — PROTEIN ELECTROPHORESIS, SERUM, WITH REFLEX
A/G Ratio: 1.5 (ref 0.7–1.7)
Albumin ELP: 3.5 g/dL (ref 2.9–4.4)
Alpha-1-Globulin: 0.2 g/dL (ref 0.0–0.4)
Alpha-2-Globulin: 0.7 g/dL (ref 0.4–1.0)
Beta Globulin: 0.9 g/dL (ref 0.7–1.3)
Gamma Globulin: 0.5 g/dL (ref 0.4–1.8)
Globulin, Total: 2.4 g/dL (ref 2.2–3.9)
Total Protein ELP: 5.9 g/dL — ABNORMAL LOW (ref 6.0–8.5)

## 2019-11-17 LAB — KAPPA/LAMBDA LIGHT CHAINS
Kappa free light chain: 10.9 mg/L (ref 3.3–19.4)
Kappa, lambda light chain ratio: 1.38 (ref 0.26–1.65)
Lambda free light chains: 7.9 mg/L (ref 5.7–26.3)

## 2019-12-08 IMAGING — CR CHEST - 2 VIEW
2 series · 2 of 2 positions shown · non-contrast
Comparison: 12/24/2017, PET-CT 02/02/2018

CLINICAL DATA: 84-year-old male with a history of multiple myeloma
1 year ago

EXAM:
CHEST - 2 VIEW

[w chest lat]
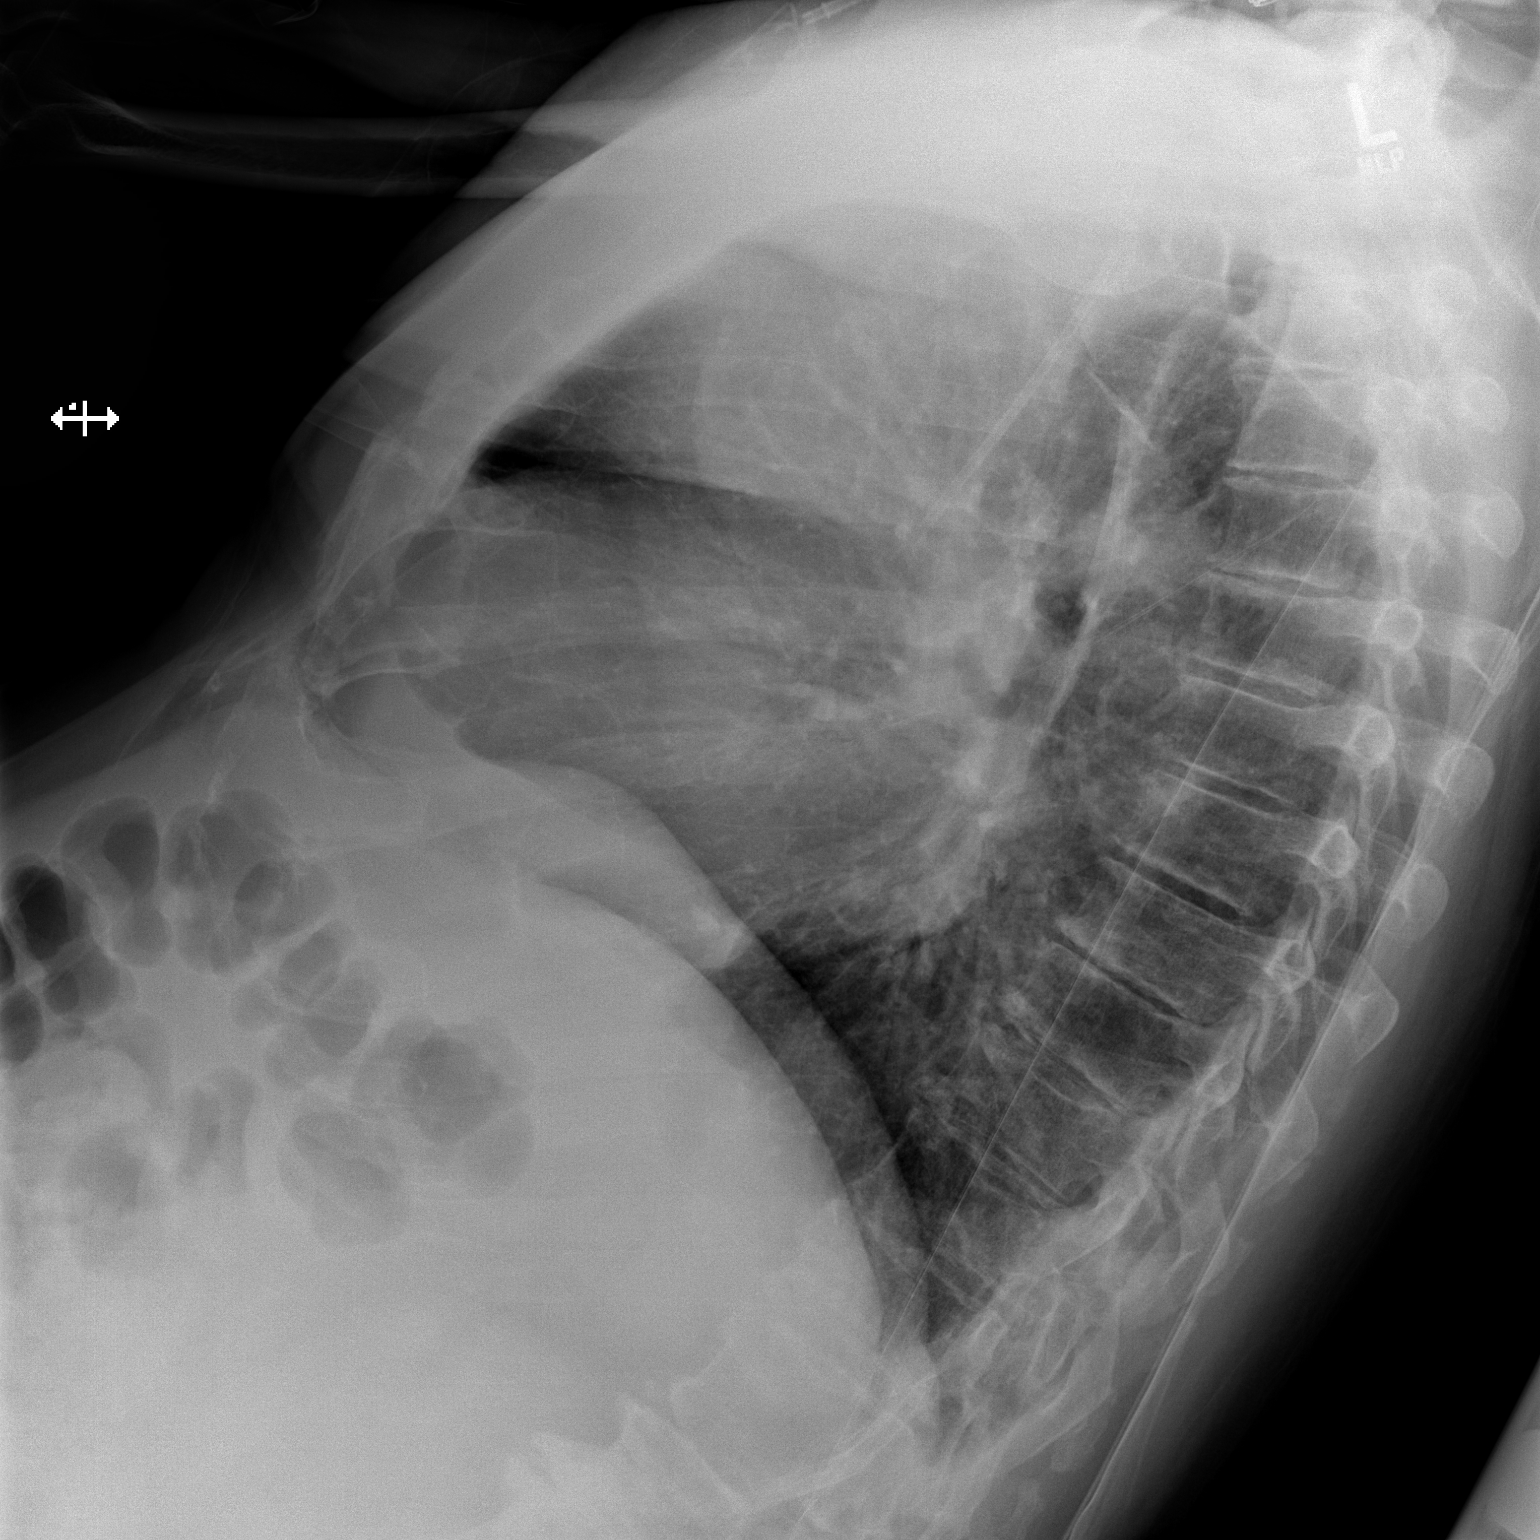

[x chest ap]
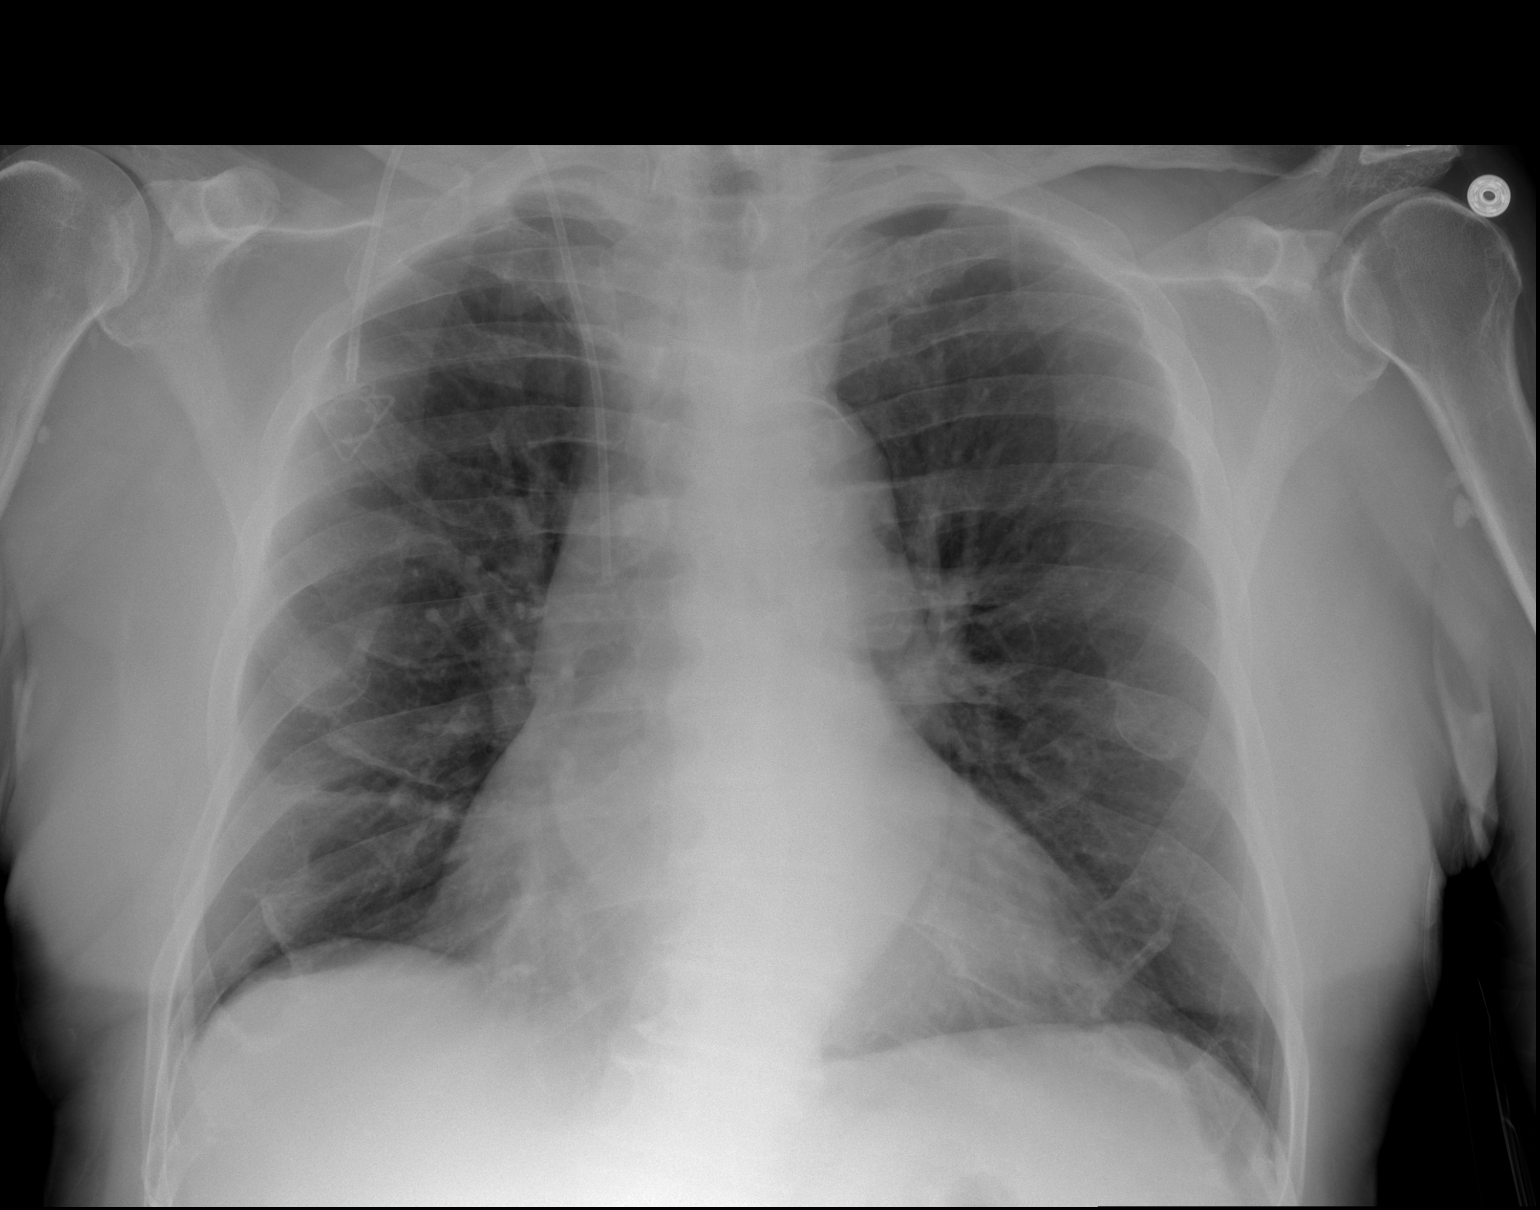

[2 of 2 positions shown; findings below may reference images not displayed]

FINDINGS: Cardiomediastinal silhouette unchanged in size and contour. Interval
placement of right IJ port catheter with the tip appearing to
terminate superior vena cava.

No pneumothorax or pleural effusion. No confluent airspace disease.
No interlobular septal thickening/central vascular congestion.
IMPRESSION: Chronic lung changes without evidence of acute cardiopulmonary
disease

## 2019-12-13 ENCOUNTER — Encounter: Payer: Self-pay | Admitting: Family

## 2019-12-13 ENCOUNTER — Inpatient Hospital Stay (HOSPITAL_BASED_OUTPATIENT_CLINIC_OR_DEPARTMENT_OTHER): Payer: Medicare Other | Admitting: Family

## 2019-12-13 ENCOUNTER — Inpatient Hospital Stay: Payer: Medicare Other | Attending: Hematology & Oncology

## 2019-12-13 ENCOUNTER — Inpatient Hospital Stay: Payer: Medicare Other

## 2019-12-13 ENCOUNTER — Other Ambulatory Visit: Payer: Self-pay

## 2019-12-13 VITALS — BP 161/83 | HR 60 | Temp 97.6°F | Resp 18 | Ht 71.0 in | Wt 217.0 lb

## 2019-12-13 VITALS — BP 161/83 | HR 59 | Temp 97.6°F | Resp 18 | Wt 217.1 lb

## 2019-12-13 DIAGNOSIS — C9000 Multiple myeloma not having achieved remission: Secondary | ICD-10-CM

## 2019-12-13 DIAGNOSIS — C9002 Multiple myeloma in relapse: Secondary | ICD-10-CM | POA: Diagnosis not present

## 2019-12-13 DIAGNOSIS — Z79899 Other long term (current) drug therapy: Secondary | ICD-10-CM | POA: Insufficient documentation

## 2019-12-13 DIAGNOSIS — C9001 Multiple myeloma in remission: Secondary | ICD-10-CM | POA: Diagnosis not present

## 2019-12-13 DIAGNOSIS — N183 Chronic kidney disease, stage 3 unspecified: Secondary | ICD-10-CM

## 2019-12-13 DIAGNOSIS — Z5112 Encounter for antineoplastic immunotherapy: Secondary | ICD-10-CM | POA: Insufficient documentation

## 2019-12-13 DIAGNOSIS — M899 Disorder of bone, unspecified: Secondary | ICD-10-CM

## 2019-12-13 DIAGNOSIS — D631 Anemia in chronic kidney disease: Secondary | ICD-10-CM

## 2019-12-13 DIAGNOSIS — Z95828 Presence of other vascular implants and grafts: Secondary | ICD-10-CM

## 2019-12-13 LAB — CMP (CANCER CENTER ONLY)
ALT: 12 U/L (ref 0–44)
AST: 16 U/L (ref 15–41)
Albumin: 4.2 g/dL (ref 3.5–5.0)
Alkaline Phosphatase: 53 U/L (ref 38–126)
Anion gap: 8 (ref 5–15)
BUN: 28 mg/dL — ABNORMAL HIGH (ref 8–23)
CO2: 28 mmol/L (ref 22–32)
Calcium: 10.2 mg/dL (ref 8.9–10.3)
Chloride: 102 mmol/L (ref 98–111)
Creatinine: 1.43 mg/dL — ABNORMAL HIGH (ref 0.61–1.24)
GFR, Est AFR Am: 52 mL/min — ABNORMAL LOW (ref >60)
GFR, Estimated: 45 mL/min — ABNORMAL LOW (ref >60)
Glucose, Bld: 112 mg/dL — ABNORMAL HIGH (ref 70–99)
Potassium: 3.5 mmol/L (ref 3.5–5.1)
Sodium: 138 mmol/L (ref 135–145)
Total Bilirubin: 0.7 mg/dL (ref 0.3–1.2)
Total Protein: 6.2 g/dL — ABNORMAL LOW (ref 6.5–8.1)

## 2019-12-13 LAB — CBC WITH DIFFERENTIAL (CANCER CENTER ONLY)
Abs Immature Granulocytes: 0.03 10*3/uL (ref 0.00–0.07)
Basophils Absolute: 0 10*3/uL (ref 0.0–0.1)
Basophils Relative: 1 %
Eosinophils Absolute: 0.1 10*3/uL (ref 0.0–0.5)
Eosinophils Relative: 2 %
HCT: 33.2 % — ABNORMAL LOW (ref 39.0–52.0)
Hemoglobin: 11 g/dL — ABNORMAL LOW (ref 13.0–17.0)
Immature Granulocytes: 1 %
Lymphocytes Relative: 33 %
Lymphs Abs: 1.8 10*3/uL (ref 0.7–4.0)
MCH: 32.4 pg (ref 26.0–34.0)
MCHC: 33.1 g/dL (ref 30.0–36.0)
MCV: 97.6 fL (ref 80.0–100.0)
Monocytes Absolute: 0.7 10*3/uL (ref 0.1–1.0)
Monocytes Relative: 12 %
Neutro Abs: 2.9 10*3/uL (ref 1.7–7.7)
Neutrophils Relative %: 51 %
Platelet Count: 174 10*3/uL (ref 150–400)
RBC: 3.4 MIL/uL — ABNORMAL LOW (ref 4.22–5.81)
RDW: 13 % (ref 11.5–15.5)
WBC Count: 5.5 10*3/uL (ref 4.0–10.5)
nRBC: 0 % (ref 0.0–0.2)

## 2019-12-13 MED ORDER — SODIUM CHLORIDE 0.9% FLUSH
10.0000 mL | INTRAVENOUS | Status: DC | PRN
  Administered 2019-12-13: 10 mL
  Filled 2019-12-13: qty 10

## 2019-12-13 MED ORDER — DEXAMETHASONE 4 MG PO TABS
20.0000 mg | ORAL_TABLET | Freq: Once | ORAL | Status: AC
  Administered 2019-12-13: 20 mg via ORAL

## 2019-12-13 MED ORDER — ACETAMINOPHEN 325 MG PO TABS
ORAL_TABLET | ORAL | Status: AC
  Filled 2019-12-13: qty 2

## 2019-12-13 MED ORDER — DEXAMETHASONE 4 MG PO TABS
ORAL_TABLET | ORAL | Status: AC
  Filled 2019-12-13: qty 5

## 2019-12-13 MED ORDER — ZOLEDRONIC ACID 4 MG/100ML IV SOLN
4.0000 mg | Freq: Once | INTRAVENOUS | Status: AC
  Administered 2019-12-13: 4 mg via INTRAVENOUS
  Filled 2019-12-13: qty 100

## 2019-12-13 MED ORDER — DARATUMUMAB-HYALURONIDASE-FIHJ 1800-30000 MG-UT/15ML ~~LOC~~ SOLN
1800.0000 mg | Freq: Once | SUBCUTANEOUS | Status: AC
  Administered 2019-12-13: 1800 mg via SUBCUTANEOUS
  Filled 2019-12-13: qty 15

## 2019-12-13 MED ORDER — HEPARIN SOD (PORK) LOCK FLUSH 100 UNIT/ML IV SOLN
500.0000 [IU] | Freq: Once | INTRAVENOUS | Status: AC | PRN
  Administered 2019-12-13: 500 [IU]
  Filled 2019-12-13: qty 5

## 2019-12-13 MED ORDER — SODIUM CHLORIDE 0.9% FLUSH
10.0000 mL | Freq: Once | INTRAVENOUS | Status: AC
  Administered 2019-12-13: 10 mL via INTRAVENOUS
  Filled 2019-12-13: qty 10

## 2019-12-13 MED ORDER — ACETAMINOPHEN 325 MG PO TABS
650.0000 mg | ORAL_TABLET | Freq: Once | ORAL | Status: AC
  Administered 2019-12-13: 650 mg via ORAL

## 2019-12-13 MED ORDER — DIPHENHYDRAMINE HCL 25 MG PO CAPS
ORAL_CAPSULE | ORAL | Status: AC
  Filled 2019-12-13: qty 2

## 2019-12-13 MED ORDER — DIPHENHYDRAMINE HCL 25 MG PO CAPS
50.0000 mg | ORAL_CAPSULE | Freq: Once | ORAL | Status: AC
  Administered 2019-12-13: 50 mg via ORAL

## 2019-12-13 NOTE — Progress Notes (Signed)
Hematology and Oncology Follow Up Visit  Robert [name] [id number] 1933/12/26 86 y.o. 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 3 weeks for hemoglobin less than 10   Interim History:  Robert [name] is here today for follow-up. He continues to do well but does states that he has some mild fatigue if he sits down for too long.  In June, no M-spike was observed, IgG level 511 mg/dL and kappa light chains 10.9 mg/L.  He also notes occasional episodes of dizziness when he stands up too quickly.  Thankfully, he has not had any falls or syncope.  The swelling in his lower extremities has resolved. No tenderness noted.  He has some numbness and tingling occasionally in his hands.  No fever, chills, n/v, cough, rash, dizziness, SOB, chest pain, palpitations, abdominal pain or changes in bowel or bladder habits.  He states that he uses a laxative as needed for constipation.  No episodes of bleeding. No abnormal bruising, no petechiae.  He has maintained a good appetite but states that he needs to better hydrate throughout the day.   ECOG Performance Status: 1 - Symptomatic but completely ambulatory  Medications:  Allergies as of 12/13/2019   No Known Allergies     Medication List       Accurate as of December 13, 2019  9:41 AM. If you have any questions, ask your nurse or doctor.        aspirin 81 MG tablet Take 81 mg by mouth daily.   bimatoprost 0.01 % Soln Commonly known as: LUMIGAN Place 1 drop into both eyes at bedtime.   CALTRATE 600+D PLUS PO Take 1 tablet by mouth  daily.   carvedilol 12.5 MG tablet Commonly known as: COREG Take 1 tablet (12.5 mg total) by mouth 2 (two) times daily with a meal.   ciclopirox 0.77 % cream Commonly known as: LOPROX APPLY CREAM TOPICALLY ONCE DAILY   cloNIDine 0.1 MG tablet Commonly known as: CATAPRES Take 1 tablet (0.1 mg total) by mouth daily. Take an extra tablet as needed for blood pressure greater than 170/100 (x1 per day)   [employer] VITAMIN B12 2000 MCG tablet Generic drug: cyanocobalamin Take 2,000 mcg by mouth at bedtime.   famciclovir 250 MG tablet Commonly known as: FAMVIR TAKE 1 TABLET DAILY   furosemide 20 MG tablet Commonly known as: LASIX Take 2 tablets (40 mg total) by mouth daily.   HCA LAX-X PO Take by mouth every three (3) days as needed.   losartan 100 MG tablet Commonly known as: COZAAR Take 100 mg by mouth daily.   metolazone 5 MG tablet Commonly known as: ZAROXOLYN Take 1 pill 1 hour prior to taking Lasix daily   montelukast 10 MG tablet Commonly known as: Singulair Take 1 tablet (10 mg total) by mouth at bedtime.   multivitamin with minerals Tabs tablet Take 1 tablet by mouth daily.   potassium chloride SA 20 MEQ tablet Commonly known as: KLOR-CON Take 20 mEq by mouth daily.   pravastatin 20 MG tablet Commonly known as: PRAVACHOL Take 20 mg by mouth  at bedtime.   SYSTANE OP Place 1 drop into both eyes daily as needed (dry eyes).   Vitamin D3 25 MCG (1000 UT) Caps Take 1,000 Units by mouth daily.       Allergies: No Known Allergies  Past Medical History, Surgical history, Social history, and Family History were reviewed and updated.  Review of Systems: All other 10 point review of systems is negative.   Physical Exam:  height is 5\' 11"  (1.803 m) and weight is 217 lb (98.4 kg). His oral temperature is 97.6 F (36.4 C). His blood pressure is 161/83 (abnormal) and his pulse is 60. His respiration is 18 and oxygen saturation is 100%.   Wt Readings from Last 3  Encounters:  12/13/19 217 lb (98.4 kg)  12/13/19 217 lb 1.3 oz (98.5 kg)  11/14/19 215 lb 1.9 oz (97.6 kg)    Ocular: Sclerae unicteric, pupils equal, round and reactive to light Ear-nose-throat: Oropharynx clear, dentition fair Lymphatic: No cervical or supraclavicular adenopathy Lungs no rales or rhonchi, good excursion bilaterally Heart regular rate and rhythm, no murmur appreciated Abd soft, nontender, positive bowel sounds, no liver or spleen tip palpated on exam, no fluid wave  MSK no focal spinal tenderness, no joint edema Neuro: non-focal, well-oriented, appropriate affect Breasts: Deferred   Lab Results  Component Value Date   WBC 5.5 12/13/2019   HGB 11.0 (L) 12/13/2019   HCT 33.2 (L) 12/13/2019   MCV 97.6 12/13/2019   PLT 174 12/13/2019   Lab Results  Component Value Date   FERRITIN 1,061 (H) 06/28/2019   IRON 82 06/28/2019   TIBC 281 06/28/2019   UIBC 200 06/28/2019   IRONPCTSAT 29 06/28/2019   Lab Results  Component Value Date   RETICCTPCT 2.2 04/05/2019   RBC 3.40 (L) 12/13/2019   Lab Results  Component Value Date   KPAFRELGTCHN 10.9 11/14/2019   LAMBDASER 7.9 11/14/2019   KAPLAMBRATIO 1.38 11/14/2019   Lab Results  Component Value Date   IGGSERUM 511 (L) 11/14/2019   IGA 19 (L) 11/14/2019   IGMSERUM 30 11/14/2019   Lab Results  Component Value Date   TOTALPROTELP 5.9 (L) 11/14/2019   ALBUMINELP 3.5 11/14/2019   A1GS 0.2 11/14/2019   A2GS 0.7 11/14/2019   BETS 0.9 11/14/2019   BETA2SER 0.3 01/03/2015   GAMS 0.5 11/14/2019   MSPIKE Not Observed 11/14/2019   SPEI Comment 10/18/2019     Chemistry      Component Value Date/Time   NA 138 11/14/2019 1046   NA 137 04/08/2017 1141   NA 138 10/29/2015 1029   K 3.4 (L) 11/14/2019 1046   K 3.4 04/08/2017 1141   K 3.9 10/29/2015 1029   CL 102 11/14/2019 1046   CL 102 04/08/2017 1141   CO2 30 11/14/2019 1046   CO2 30 04/08/2017 1141   CO2 27 10/29/2015 1029   BUN 23 11/14/2019 1046   BUN 16  04/08/2017 1141   BUN 13.8 10/29/2015 1029   CREATININE 1.30 (H) 11/14/2019 1046   CREATININE 1.3 (H) 04/08/2017 1141   CREATININE 1.1 10/29/2015 1029      Component Value Date/Time   CALCIUM 9.8 11/14/2019 1046   CALCIUM 9.3 04/08/2017 1141   CALCIUM 9.3 10/29/2015 1029   ALKPHOS 55 11/14/2019 1046   ALKPHOS 66 04/08/2017 1141   ALKPHOS 52 10/29/2015 1029   AST 16 11/14/2019 1046   AST 20 10/29/2015 1029   ALT 13 11/14/2019 1046   ALT 21 04/08/2017 1141  ALT 14 10/29/2015 1029   BILITOT 0.6 11/14/2019 1046   BILITOT 0.75 10/29/2015 1029       Impression and Plan: Mr. Iovino is a very pleasant 86 yo African American gentleman with relapsed IgG kappa myeloma. He has history of stem cell transplant in 2006.  He is tolerating treatment well and has had nice response so far.  We will proceed with Daratumumab today as planned.  We will plan to see him again in another month.  He can contact our office with any questions or concerns.   Laverna Peace, NP 7/21/20219:41 AM

## 2019-12-13 NOTE — Patient Instructions (Signed)
Daratumumab injection What is this medicine? DARATUMUMAB (dar a toom ue mab) is a monoclonal antibody. It is used to treat multiple myeloma. This medicine may be used for other purposes; ask your health care provider or pharmacist if you have questions. COMMON BRAND NAME(S): DARZALEX What should I tell my health care provider before I take this medicine? They need to know if you have any of these conditions:  infection (especially a virus infection such as chickenpox, herpes, or hepatitis B virus)  lung or breathing disease  an unusual or allergic reaction to daratumumab, other medicines, foods, dyes, or preservatives  pregnant or trying to get pregnant  breast-feeding How should I use this medicine? This medicine is for infusion into a vein. It is given by a health care professional in a hospital or clinic setting. Talk to your pediatrician regarding the use of this medicine in children. Special care may be needed. Overdosage: If you think you have taken too much of this medicine contact a poison control center or emergency room at once. NOTE: This medicine is only for you. Do not share this medicine with others. What if I miss a dose? Keep appointments for follow-up doses as directed. It is important not to miss your dose. Call your doctor or health care professional if you are unable to keep an appointment. What may interact with this medicine? Interactions have not been studied. This list may not describe all possible interactions. Give your health care provider a list of all the medicines, herbs, non-prescription drugs, or dietary supplements you use. Also tell them if you smoke, drink alcohol, or use illegal drugs. Some items may interact with your medicine. What should I watch for while using this medicine? This drug may make you feel generally unwell. Report any side effects. Continue your course of treatment even though you feel ill unless your doctor tells you to stop. This  medicine can cause serious allergic reactions. To reduce your risk you may need to take medicine before treatment with this medicine. Take your medicine as directed. This medicine can affect the results of blood tests to match your blood type. These changes can last for up to 6 months after the final dose. Your healthcare provider will do blood tests to match your blood type before you start treatment. Tell all of your healthcare providers that you are being treated with this medicine before receiving a blood transfusion. This medicine can affect the results of some tests used to determine treatment response; extra tests may be needed to evaluate response. Do not become pregnant while taking this medicine or for 3 months after stopping it. Women should inform their doctor if they wish to become pregnant or think they might be pregnant. There is a potential for serious side effects to an unborn child. Talk to your health care professional or pharmacist for more information. What side effects may I notice from receiving this medicine? Side effects that you should report to your doctor or health care professional as soon as possible:  allergic reactions like skin rash, itching or hives, swelling of the face, lips, or tongue  breathing problems  chills  cough  dizziness  feeling faint or lightheaded  headache  low blood counts - this medicine may decrease the number of white blood cells, red blood cells and platelets. You may be at increased risk for infections and bleeding.  nausea, vomiting  shortness of breath  signs of decreased platelets or bleeding - bruising, pinpoint red spots on  the skin, black, tarry stools, blood in the urine  signs of decreased red blood cells - unusually weak or tired, feeling faint or lightheaded, falls  signs of infection - fever or chills, cough, sore throat, pain or difficulty passing urine  signs and symptoms of liver injury like dark yellow or brown  urine; general ill feeling or flu-like symptoms; light-colored stools; loss of appetite; right upper belly pain; unusually weak or tired; yellowing of the eyes or skin Side effects that usually do not require medical attention (report to your doctor or health care professional if they continue or are bothersome):  back pain  constipation  diarrhea  joint pain  muscle cramps  pain, tingling, numbness in the hands or feet  swelling of the ankles, feet, hands  tiredness  trouble sleeping This list may not describe all possible side effects. Call your doctor for medical advice about side effects. You may report side effects to FDA at 1-800-FDA-1088. Where should I keep my medicine? This drug is given in a hospital or clinic and will not be stored at home. NOTE: This sheet is a summary. It may not cover all possible information. If you have questions about this medicine, talk to your doctor, pharmacist, or health care provider.  2020 Elsevier/Gold Standard (2019-01-17 18:10:54)  

## 2019-12-14 LAB — KAPPA/LAMBDA LIGHT CHAINS
Kappa free light chain: 11.7 mg/L (ref 3.3–19.4)
Kappa, lambda light chain ratio: 1.5 (ref 0.26–1.65)
Lambda free light chains: 7.8 mg/L (ref 5.7–26.3)

## 2019-12-14 LAB — IGG, IGA, IGM
IgA: 18 mg/dL — ABNORMAL LOW (ref 61–437)
IgG (Immunoglobin G), Serum: 512 mg/dL — ABNORMAL LOW (ref 603–1613)
IgM (Immunoglobulin M), Srm: 31 mg/dL (ref 15–143)

## 2019-12-15 LAB — PROTEIN ELECTROPHORESIS, SERUM, WITH REFLEX
A/G Ratio: 1.7 (ref 0.7–1.7)
Albumin ELP: 3.7 g/dL (ref 2.9–4.4)
Alpha-1-Globulin: 0.2 g/dL (ref 0.0–0.4)
Alpha-2-Globulin: 0.8 g/dL (ref 0.4–1.0)
Beta Globulin: 0.8 g/dL (ref 0.7–1.3)
Gamma Globulin: 0.5 g/dL (ref 0.4–1.8)
Globulin, Total: 2.2 g/dL (ref 2.2–3.9)
Total Protein ELP: 5.9 g/dL — ABNORMAL LOW (ref 6.0–8.5)

## 2020-01-10 ENCOUNTER — Telehealth: Payer: Self-pay | Admitting: Hematology & Oncology

## 2020-01-10 ENCOUNTER — Inpatient Hospital Stay: Payer: Medicare Other | Attending: Hematology & Oncology

## 2020-01-10 ENCOUNTER — Inpatient Hospital Stay: Payer: Medicare Other

## 2020-01-10 ENCOUNTER — Other Ambulatory Visit: Payer: Self-pay

## 2020-01-10 ENCOUNTER — Encounter: Payer: Self-pay | Admitting: Hematology & Oncology

## 2020-01-10 ENCOUNTER — Inpatient Hospital Stay (HOSPITAL_BASED_OUTPATIENT_CLINIC_OR_DEPARTMENT_OTHER): Payer: Medicare Other | Admitting: Hematology & Oncology

## 2020-01-10 VITALS — BP 174/80 | HR 60 | Temp 97.7°F | Resp 17 | Wt 219.0 lb

## 2020-01-10 DIAGNOSIS — C9000 Multiple myeloma not having achieved remission: Secondary | ICD-10-CM

## 2020-01-10 DIAGNOSIS — C9002 Multiple myeloma in relapse: Secondary | ICD-10-CM | POA: Insufficient documentation

## 2020-01-10 DIAGNOSIS — Z79899 Other long term (current) drug therapy: Secondary | ICD-10-CM | POA: Diagnosis not present

## 2020-01-10 DIAGNOSIS — Z5112 Encounter for antineoplastic immunotherapy: Secondary | ICD-10-CM | POA: Diagnosis not present

## 2020-01-10 DIAGNOSIS — C9001 Multiple myeloma in remission: Secondary | ICD-10-CM

## 2020-01-10 LAB — CBC WITH DIFFERENTIAL (CANCER CENTER ONLY)
Abs Immature Granulocytes: 0.01 10*3/uL (ref 0.00–0.07)
Basophils Absolute: 0 10*3/uL (ref 0.0–0.1)
Basophils Relative: 0 %
Eosinophils Absolute: 0.1 10*3/uL (ref 0.0–0.5)
Eosinophils Relative: 2 %
HCT: 33.3 % — ABNORMAL LOW (ref 39.0–52.0)
Hemoglobin: 11.1 g/dL — ABNORMAL LOW (ref 13.0–17.0)
Immature Granulocytes: 0 %
Lymphocytes Relative: 34 %
Lymphs Abs: 1.8 10*3/uL (ref 0.7–4.0)
MCH: 32.2 pg (ref 26.0–34.0)
MCHC: 33.3 g/dL (ref 30.0–36.0)
MCV: 96.5 fL (ref 80.0–100.0)
Monocytes Absolute: 0.6 10*3/uL (ref 0.1–1.0)
Monocytes Relative: 11 %
Neutro Abs: 2.8 10*3/uL (ref 1.7–7.7)
Neutrophils Relative %: 53 %
Platelet Count: 170 10*3/uL (ref 150–400)
RBC: 3.45 MIL/uL — ABNORMAL LOW (ref 4.22–5.81)
RDW: 13.1 % (ref 11.5–15.5)
WBC Count: 5.3 10*3/uL (ref 4.0–10.5)
nRBC: 0 % (ref 0.0–0.2)

## 2020-01-10 LAB — CMP (CANCER CENTER ONLY)
ALT: 14 U/L (ref 0–44)
AST: 17 U/L (ref 15–41)
Albumin: 4.2 g/dL (ref 3.5–5.0)
Alkaline Phosphatase: 53 U/L (ref 38–126)
Anion gap: 6 (ref 5–15)
BUN: 23 mg/dL (ref 8–23)
CO2: 29 mmol/L (ref 22–32)
Calcium: 10.1 mg/dL (ref 8.9–10.3)
Chloride: 103 mmol/L (ref 98–111)
Creatinine: 1.18 mg/dL (ref 0.61–1.24)
GFR, Est AFR Am: >60 mL/min (ref >60)
GFR, Estimated: 56 mL/min — ABNORMAL LOW (ref >60)
Glucose, Bld: 99 mg/dL (ref 70–99)
Potassium: 3.7 mmol/L (ref 3.5–5.1)
Sodium: 138 mmol/L (ref 135–145)
Total Bilirubin: 0.9 mg/dL (ref 0.3–1.2)
Total Protein: 6.2 g/dL — ABNORMAL LOW (ref 6.5–8.1)

## 2020-01-10 MED ORDER — DIPHENHYDRAMINE HCL 25 MG PO CAPS
50.0000 mg | ORAL_CAPSULE | Freq: Once | ORAL | Status: AC
  Administered 2020-01-10: 50 mg via ORAL

## 2020-01-10 MED ORDER — ACETAMINOPHEN 325 MG PO TABS
ORAL_TABLET | ORAL | Status: AC
  Filled 2020-01-10: qty 2

## 2020-01-10 MED ORDER — DEXAMETHASONE 4 MG PO TABS
20.0000 mg | ORAL_TABLET | Freq: Once | ORAL | Status: AC
  Administered 2020-01-10: 20 mg via ORAL

## 2020-01-10 MED ORDER — DEXAMETHASONE 4 MG PO TABS
ORAL_TABLET | ORAL | Status: AC
  Filled 2020-01-10: qty 5

## 2020-01-10 MED ORDER — ACETAMINOPHEN 325 MG PO TABS
650.0000 mg | ORAL_TABLET | Freq: Once | ORAL | Status: AC
  Administered 2020-01-10: 650 mg via ORAL

## 2020-01-10 MED ORDER — DARATUMUMAB-HYALURONIDASE-FIHJ 1800-30000 MG-UT/15ML ~~LOC~~ SOLN
1800.0000 mg | Freq: Once | SUBCUTANEOUS | Status: AC
  Administered 2020-01-10: 1800 mg via SUBCUTANEOUS
  Filled 2020-01-10: qty 15

## 2020-01-10 MED ORDER — DIPHENHYDRAMINE HCL 25 MG PO CAPS
ORAL_CAPSULE | ORAL | Status: AC
  Filled 2020-01-10: qty 2

## 2020-01-10 NOTE — Patient Instructions (Signed)
Daratumumab injection What is this medicine? DARATUMUMAB (dar a toom ue mab) is a monoclonal antibody. It is used to treat multiple myeloma. This medicine may be used for other purposes; ask your health care provider or pharmacist if you have questions. COMMON BRAND NAME(S): DARZALEX What should I tell my health care provider before I take this medicine? They need to know if you have any of these conditions:  infection (especially a virus infection such as chickenpox, herpes, or hepatitis B virus)  lung or breathing disease  an unusual or allergic reaction to daratumumab, other medicines, foods, dyes, or preservatives  pregnant or trying to get pregnant  breast-feeding How should I use this medicine? This medicine is for infusion into a vein. It is given by a health care professional in a hospital or clinic setting. Talk to your pediatrician regarding the use of this medicine in children. Special care may be needed. Overdosage: If you think you have taken too much of this medicine contact a poison control center or emergency room at once. NOTE: This medicine is only for you. Do not share this medicine with others. What if I miss a dose? Keep appointments for follow-up doses as directed. It is important not to miss your dose. Call your doctor or health care professional if you are unable to keep an appointment. What may interact with this medicine? Interactions have not been studied. This list may not describe all possible interactions. Give your health care provider a list of all the medicines, herbs, non-prescription drugs, or dietary supplements you use. Also tell them if you smoke, drink alcohol, or use illegal drugs. Some items may interact with your medicine. What should I watch for while using this medicine? This drug may make you feel generally unwell. Report any side effects. Continue your course of treatment even though you feel ill unless your doctor tells you to stop. This  medicine can cause serious allergic reactions. To reduce your risk you may need to take medicine before treatment with this medicine. Take your medicine as directed. This medicine can affect the results of blood tests to match your blood type. These changes can last for up to 6 months after the final dose. Your healthcare provider will do blood tests to match your blood type before you start treatment. Tell all of your healthcare providers that you are being treated with this medicine before receiving a blood transfusion. This medicine can affect the results of some tests used to determine treatment response; extra tests may be needed to evaluate response. Do not become pregnant while taking this medicine or for 3 months after stopping it. Women should inform their doctor if they wish to become pregnant or think they might be pregnant. There is a potential for serious side effects to an unborn child. Talk to your health care professional or pharmacist for more information. What side effects may I notice from receiving this medicine? Side effects that you should report to your doctor or health care professional as soon as possible:  allergic reactions like skin rash, itching or hives, swelling of the face, lips, or tongue  breathing problems  chills  cough  dizziness  feeling faint or lightheaded  headache  low blood counts - this medicine may decrease the number of white blood cells, red blood cells and platelets. You may be at increased risk for infections and bleeding.  nausea, vomiting  shortness of breath  signs of decreased platelets or bleeding - bruising, pinpoint red spots on  the skin, black, tarry stools, blood in the urine  signs of decreased red blood cells - unusually weak or tired, feeling faint or lightheaded, falls  signs of infection - fever or chills, cough, sore throat, pain or difficulty passing urine  signs and symptoms of liver injury like dark yellow or brown  urine; general ill feeling or flu-like symptoms; light-colored stools; loss of appetite; right upper belly pain; unusually weak or tired; yellowing of the eyes or skin Side effects that usually do not require medical attention (report to your doctor or health care professional if they continue or are bothersome):  back pain  constipation  diarrhea  joint pain  muscle cramps  pain, tingling, numbness in the hands or feet  swelling of the ankles, feet, hands  tiredness  trouble sleeping This list may not describe all possible side effects. Call your doctor for medical advice about side effects. You may report side effects to FDA at 1-800-FDA-1088. Where should I keep my medicine? This drug is given in a hospital or clinic and will not be stored at home. NOTE: This sheet is a summary. It may not cover all possible information. If you have questions about this medicine, talk to your doctor, pharmacist, or health care provider.  2020 Elsevier/Gold Standard (2019-01-17 18:10:54)

## 2020-01-10 NOTE — Progress Notes (Signed)
Hematology and Oncology Follow Up Visit  Robert [name] [id number] 02-05-1934 86 y.o. 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 3 weeks for hemoglobin less than 10   Interim History:  Robert [name] is here today for follow-up and treatment.  He really looks great.  He gained a little bit of weight.  However, this really is not all that bad considering where he had been before.  I am just happy that he has responded nicely to Robert daratumumab.  He has had no problems with Robert daratumumab.  His myeloma is in remission right now.  There is no monoclonal spike in his blood.  As always, he and his wife go back to his home town [name].  They have some property there.  As it was fun talking to him about this.  He has had no problems with fever.  He has had no change in bowel or bladder habits.  He says his blood pressure is under better control.  There is now much swelling in his legs.  There has been no problems with headache.  He has had no bleeding.  He has had no cough or shortness of breath.  He has had a coronavirus vaccines.  I told him that he would be a candidate for Robert booster injection whenever that is available.  He has had no problems since we last saw him.  He has had no issues with respect to his blood pressure.  There is no swelling in his legs.  Overall, his performance status is ECOG 1.   Medications:  Allergies as of 01/10/2020   No Known Allergies     Medication List       Accurate as of January 10, 2020 10:58 AM. If you have any questions, ask your  nurse or doctor.        aspirin 81 MG tablet Take 81 mg by mouth daily.   bimatoprost 0.01 % Soln Commonly known as: LUMIGAN Place 1 drop into both eyes at bedtime.   CALTRATE 600+D PLUS PO Take 1 tablet by mouth daily.   carvedilol 12.5 MG tablet Commonly known as: COREG Take 1 tablet (12.5 mg total) by mouth 2 (two) times daily with a meal.   ciclopirox 0.77 % cream Commonly known as: LOPROX APPLY CREAM TOPICALLY ONCE DAILY   cloNIDine 0.1 MG tablet Commonly known as: CATAPRES Take 1 tablet (0.1 mg total) by mouth daily. Take an extra tablet as needed for blood pressure greater than 170/100 (x1 per day)   [employer] VITAMIN B12 2000 MCG tablet Generic drug: cyanocobalamin Take 2,000 mcg by mouth at bedtime.   famciclovir 250 MG tablet Commonly known as: FAMVIR TAKE 1 TABLET DAILY   furosemide 20 MG tablet Commonly known as: LASIX Take 2 tablets (40 mg total) by mouth daily.   HCA LAX-X PO Take by mouth every three (3) days as needed.   losartan 100 MG tablet Commonly known as: COZAAR Take 100 mg by mouth daily.   metolazone 5 MG tablet Commonly known as: ZAROXOLYN  Take 1 pill 1 hour prior to taking Lasix daily   montelukast 10 MG tablet Commonly known as: Singulair Take 1 tablet (10 mg total) by mouth at bedtime.   multivitamin with minerals Tabs tablet Take 1 tablet by mouth daily.   potassium chloride SA 20 MEQ tablet Commonly known as: KLOR-CON Take 20 mEq by mouth daily.   pravastatin 20 MG tablet Commonly known as: PRAVACHOL Take 20 mg by mouth at bedtime.   SYSTANE OP Place 1 drop into both eyes daily as needed (dry eyes).   Vitamin D3 25 MCG (1000 UT) Caps Take 1,000 Units by mouth daily.       Allergies: No Known Allergies  Past Medical History, Surgical history, Social history, and Family History were reviewed and updated.  Review of Systems: Review of Systems  Constitutional: Negative.   HENT: Negative.   Eyes: Negative.     Respiratory: Negative.   Cardiovascular: Negative.   Gastrointestinal: Negative.   Genitourinary: Negative.   Musculoskeletal: Negative.   Skin: Negative.   Neurological: Negative.   Endo/Heme/Allergies: Negative.   Psychiatric/Behavioral: Negative.       Physical Exam:  weight is 219 lb (99.3 kg). His oral temperature is 97.7 F (36.5 C). His blood pressure is 174/80 (abnormal) and his pulse is 60. His respiration is 17 and oxygen saturation is 100%.   Wt Readings from Last 3 Encounters:  01/10/20 219 lb (99.3 kg)  12/13/19 217 lb (98.4 kg)  12/13/19 217 lb 1.3 oz (98.5 kg)    Physical Exam Vitals reviewed.  HENT:     Head: Normocephalic and atraumatic.  Eyes:     Pupils: Pupils are equal, round, and reactive to light.  Cardiovascular:     Rate and Rhythm: Normal rate and regular rhythm.     Heart sounds: Normal heart sounds.  Pulmonary:     Effort: Pulmonary effort is normal.     Breath sounds: Normal breath sounds.  Abdominal:     General: Bowel sounds are normal.     Palpations: Abdomen is soft.  Musculoskeletal:        General: No tenderness or deformity. Normal range of motion.     Cervical back: Normal range of motion.  Lymphadenopathy:     Cervical: No cervical adenopathy.  Skin:    General: Skin is warm and dry.     Findings: No erythema or rash.  Neurological:     Mental Status: He is alert and oriented to person, place, and time.  Psychiatric:        Behavior: Behavior normal.        Thought Content: Thought content normal.        Judgment: Judgment normal.      Lab Results  Component Value Date   WBC 5.3 01/10/2020   HGB 11.1 (L) 01/10/2020   HCT 33.3 (L) 01/10/2020   MCV 96.5 01/10/2020   PLT 170 01/10/2020   Lab Results  Component Value Date   FERRITIN 1,061 (H) 06/28/2019   IRON 82 06/28/2019   TIBC 281 06/28/2019   UIBC 200 06/28/2019   IRONPCTSAT 29 06/28/2019   Lab Results  Component Value Date   RETICCTPCT 2.2 04/05/2019    RBC 3.45 (L) 01/10/2020   Lab Results  Component Value Date   KPAFRELGTCHN 11.7 12/13/2019   LAMBDASER 7.8 12/13/2019   KAPLAMBRATIO 1.50 12/13/2019   Lab Results  Component Value Date   IGGSERUM 512 (L) 12/13/2019   IGA 18 (L) 12/13/2019  IGMSERUM 31 12/13/2019   Lab Results  Component Value Date   TOTALPROTELP 5.9 (L) 12/13/2019   ALBUMINELP 3.7 12/13/2019   A1GS 0.2 12/13/2019   A2GS 0.8 12/13/2019   BETS 0.8 12/13/2019   BETA2SER 0.3 01/03/2015   GAMS 0.5 12/13/2019   MSPIKE Not Observed 12/13/2019   SPEI Comment 10/18/2019     Chemistry      Component Value Date/Time   NA 138 01/10/2020 1000   NA 137 04/08/2017 1141   NA 138 10/29/2015 1029   K 3.7 01/10/2020 1000   K 3.4 04/08/2017 1141   K 3.9 10/29/2015 1029   CL 103 01/10/2020 1000   CL 102 04/08/2017 1141   CO2 29 01/10/2020 1000   CO2 30 04/08/2017 1141   CO2 27 10/29/2015 1029   BUN 23 01/10/2020 1000   BUN 16 04/08/2017 1141   BUN 13.8 10/29/2015 1029   CREATININE 1.18 01/10/2020 1000   CREATININE 1.3 (H) 04/08/2017 1141   CREATININE 1.1 10/29/2015 1029      Component Value Date/Time   CALCIUM 10.1 01/10/2020 1000   CALCIUM 9.3 04/08/2017 1141   CALCIUM 9.3 10/29/2015 1029   ALKPHOS 53 01/10/2020 1000   ALKPHOS 66 04/08/2017 1141   ALKPHOS 52 10/29/2015 1029   AST 17 01/10/2020 1000   AST 20 10/29/2015 1029   ALT 14 01/10/2020 1000   ALT 21 04/08/2017 1141   ALT 14 10/29/2015 1029   BILITOT 0.9 01/10/2020 1000   BILITOT 0.75 10/29/2015 1029       Impression and Plan: Mr. Folks is a very pleasant 86 yo African American gentleman with relapsed IgG kappa myeloma. He has history of stem cell transplant in 2006.   He has tolerated Daratumumab well and has had a nice response.  Hopefully, he will maintain his response to daratumumab.  He will get his Zometa today.  Robert next Zometa dose will then be given in November.  We will plan to get him back in 1 more month.  I know he will have a  very busy Labor Day weekend.   Volanda Napoleon, MD 8/18/202110:58 AM

## 2020-01-10 NOTE — Telephone Encounter (Signed)
Appointments scheduled calendar printed per 8/18 los

## 2020-01-11 LAB — KAPPA/LAMBDA LIGHT CHAINS
Kappa free light chain: 12.8 mg/L (ref 3.3–19.4)
Kappa, lambda light chain ratio: 1.09 (ref 0.26–1.65)
Lambda free light chains: 11.7 mg/L (ref 5.7–26.3)

## 2020-01-11 LAB — IGG, IGA, IGM
IgA: 16 mg/dL — ABNORMAL LOW (ref 61–437)
IgG (Immunoglobin G), Serum: 509 mg/dL — ABNORMAL LOW (ref 603–1613)
IgM (Immunoglobulin M), Srm: 28 mg/dL (ref 15–143)

## 2020-01-12 LAB — PROTEIN ELECTROPHORESIS, SERUM
A/G Ratio: 1.5 (ref 0.7–1.7)
Albumin ELP: 3.5 g/dL (ref 2.9–4.4)
Alpha-1-Globulin: 0.1 g/dL (ref 0.0–0.4)
Alpha-2-Globulin: 0.8 g/dL (ref 0.4–1.0)
Beta Globulin: 0.9 g/dL (ref 0.7–1.3)
Gamma Globulin: 0.5 g/dL (ref 0.4–1.8)
Globulin, Total: 2.3 g/dL (ref 2.2–3.9)
Total Protein ELP: 5.8 g/dL — ABNORMAL LOW (ref 6.0–8.5)

## 2020-01-21 ENCOUNTER — Other Ambulatory Visit: Payer: Self-pay | Admitting: Hematology & Oncology

## 2020-02-05 ENCOUNTER — Other Ambulatory Visit: Payer: Self-pay | Admitting: Cardiology

## 2020-02-05 MED ORDER — FUROSEMIDE 20 MG PO TABS: 40.0000 mg | ORAL_TABLET | Freq: Every day | ORAL | 1 refills | Status: DC

## 2020-02-05 NOTE — Telephone Encounter (Signed)
Refill sent in per request.  

## 2020-02-05 NOTE — Telephone Encounter (Signed)
*  STAT* If patient is at the pharmacy, call can be transferred to refill team.   1. Which medications need to be refilled? (please list name of each medication and dose if known)  furosemide (LASIX) 20 MG tablet   2. Which pharmacy/location (including street and city if local pharmacy) is medication to be sent to? [place] ([place] - [address]  3. Do they need a 30 day or 90 day supply? 90 day

## 2020-02-07 ENCOUNTER — Encounter: Payer: Self-pay | Admitting: Family

## 2020-02-07 ENCOUNTER — Inpatient Hospital Stay (HOSPITAL_BASED_OUTPATIENT_CLINIC_OR_DEPARTMENT_OTHER): Payer: Medicare Other | Admitting: Family

## 2020-02-07 ENCOUNTER — Inpatient Hospital Stay: Payer: Medicare Other

## 2020-02-07 ENCOUNTER — Other Ambulatory Visit: Payer: Self-pay

## 2020-02-07 ENCOUNTER — Inpatient Hospital Stay: Payer: Medicare Other | Attending: Hematology & Oncology

## 2020-02-07 VITALS — BP 165/59

## 2020-02-07 VITALS — BP 145/86 | HR 64 | Temp 97.7°F | Resp 17 | Ht 71.0 in | Wt 215.0 lb

## 2020-02-07 DIAGNOSIS — D630 Anemia in neoplastic disease: Secondary | ICD-10-CM | POA: Insufficient documentation

## 2020-02-07 DIAGNOSIS — C9000 Multiple myeloma not having achieved remission: Secondary | ICD-10-CM | POA: Diagnosis not present

## 2020-02-07 DIAGNOSIS — D631 Anemia in chronic kidney disease: Secondary | ICD-10-CM | POA: Diagnosis not present

## 2020-02-07 DIAGNOSIS — Z79899 Other long term (current) drug therapy: Secondary | ICD-10-CM | POA: Insufficient documentation

## 2020-02-07 DIAGNOSIS — N183 Chronic kidney disease, stage 3 unspecified: Secondary | ICD-10-CM

## 2020-02-07 DIAGNOSIS — Z5112 Encounter for antineoplastic immunotherapy: Secondary | ICD-10-CM | POA: Insufficient documentation

## 2020-02-07 DIAGNOSIS — C9002 Multiple myeloma in relapse: Secondary | ICD-10-CM | POA: Diagnosis not present

## 2020-02-07 LAB — CMP (CANCER CENTER ONLY)
ALT: 10 U/L (ref 0–44)
AST: 14 U/L — ABNORMAL LOW (ref 15–41)
Albumin: 3.9 g/dL (ref 3.5–5.0)
Alkaline Phosphatase: 48 U/L (ref 38–126)
Anion gap: 7 (ref 5–15)
BUN: 23 mg/dL (ref 8–23)
CO2: 29 mmol/L (ref 22–32)
Calcium: 9.9 mg/dL (ref 8.9–10.3)
Chloride: 101 mmol/L (ref 98–111)
Creatinine: 1.2 mg/dL (ref 0.61–1.24)
GFR, Est AFR Am: >60 mL/min (ref >60)
GFR, Estimated: 55 mL/min — ABNORMAL LOW (ref >60)
Glucose, Bld: 100 mg/dL — ABNORMAL HIGH (ref 70–99)
Potassium: 3.6 mmol/L (ref 3.5–5.1)
Sodium: 137 mmol/L (ref 135–145)
Total Bilirubin: 0.8 mg/dL (ref 0.3–1.2)
Total Protein: 5.9 g/dL — ABNORMAL LOW (ref 6.5–8.1)

## 2020-02-07 LAB — CBC WITH DIFFERENTIAL (CANCER CENTER ONLY)
Abs Immature Granulocytes: 0.01 10*3/uL (ref 0.00–0.07)
Basophils Absolute: 0 10*3/uL (ref 0.0–0.1)
Basophils Relative: 0 %
Eosinophils Absolute: 0.1 10*3/uL (ref 0.0–0.5)
Eosinophils Relative: 2 %
HCT: 33.8 % — ABNORMAL LOW (ref 39.0–52.0)
Hemoglobin: 11.1 g/dL — ABNORMAL LOW (ref 13.0–17.0)
Immature Granulocytes: 0 %
Lymphocytes Relative: 34 %
Lymphs Abs: 2.1 10*3/uL (ref 0.7–4.0)
MCH: 32 pg (ref 26.0–34.0)
MCHC: 32.8 g/dL (ref 30.0–36.0)
MCV: 97.4 fL (ref 80.0–100.0)
Monocytes Absolute: 0.6 10*3/uL (ref 0.1–1.0)
Monocytes Relative: 10 %
Neutro Abs: 3.3 10*3/uL (ref 1.7–7.7)
Neutrophils Relative %: 54 %
Platelet Count: 200 10*3/uL (ref 150–400)
RBC: 3.47 MIL/uL — ABNORMAL LOW (ref 4.22–5.81)
RDW: 13 % (ref 11.5–15.5)
WBC Count: 6.2 10*3/uL (ref 4.0–10.5)
nRBC: 0 % (ref 0.0–0.2)

## 2020-02-07 LAB — LACTATE DEHYDROGENASE: LDH: 202 U/L — ABNORMAL HIGH (ref 98–192)

## 2020-02-07 MED ORDER — DARATUMUMAB-HYALURONIDASE-FIHJ 1800-30000 MG-UT/15ML ~~LOC~~ SOLN
1800.0000 mg | Freq: Once | SUBCUTANEOUS | Status: AC
  Administered 2020-02-07: 1800 mg via SUBCUTANEOUS
  Filled 2020-02-07: qty 15

## 2020-02-07 MED ORDER — HEPARIN SOD (PORK) LOCK FLUSH 100 UNIT/ML IV SOLN
500.0000 [IU] | Freq: Once | INTRAVENOUS | Status: AC | PRN
  Administered 2020-02-07: 500 [IU]
  Filled 2020-02-07: qty 5

## 2020-02-07 MED ORDER — SODIUM CHLORIDE 0.9% FLUSH
10.0000 mL | INTRAVENOUS | Status: DC | PRN
  Administered 2020-02-07: 10 mL
  Filled 2020-02-07: qty 10

## 2020-02-07 MED ORDER — DIPHENHYDRAMINE HCL 25 MG PO CAPS
ORAL_CAPSULE | ORAL | Status: AC
  Filled 2020-02-07: qty 2

## 2020-02-07 MED ORDER — DEXAMETHASONE 4 MG PO TABS
20.0000 mg | ORAL_TABLET | Freq: Once | ORAL | Status: AC
  Administered 2020-02-07: 20 mg via ORAL

## 2020-02-07 MED ORDER — ACETAMINOPHEN 325 MG PO TABS
ORAL_TABLET | ORAL | Status: AC
  Filled 2020-02-07: qty 2

## 2020-02-07 MED ORDER — ACETAMINOPHEN 325 MG PO TABS
650.0000 mg | ORAL_TABLET | Freq: Once | ORAL | Status: AC
  Administered 2020-02-07: 650 mg via ORAL

## 2020-02-07 MED ORDER — DIPHENHYDRAMINE HCL 25 MG PO CAPS
50.0000 mg | ORAL_CAPSULE | Freq: Once | ORAL | Status: AC
  Administered 2020-02-07: 50 mg via ORAL

## 2020-02-07 MED ORDER — DEXAMETHASONE 4 MG PO TABS
ORAL_TABLET | ORAL | Status: AC
  Filled 2020-02-07: qty 5

## 2020-02-07 NOTE — Patient Instructions (Signed)

## 2020-02-07 NOTE — Addendum Note (Signed)
Addended by: Burney Gauze R on: 02/07/2020 11:32 AM   Modules accepted: Orders

## 2020-02-07 NOTE — Patient Instructions (Signed)
Daratumumab injection What is this medicine? DARATUMUMAB (dar a toom ue mab) is a monoclonal antibody. It is used to treat multiple myeloma. This medicine may be used for other purposes; ask your health care provider or pharmacist if you have questions. COMMON BRAND NAME(S): DARZALEX What should I tell my health care provider before I take this medicine? They need to know if you have any of these conditions:  infection (especially a virus infection such as chickenpox, herpes, or hepatitis B virus)  lung or breathing disease  an unusual or allergic reaction to daratumumab, other medicines, foods, dyes, or preservatives  pregnant or trying to get pregnant  breast-feeding How should I use this medicine? This medicine is for infusion into a vein. It is given by a health care professional in a hospital or clinic setting. Talk to your pediatrician regarding the use of this medicine in children. Special care may be needed. Overdosage: If you think you have taken too much of this medicine contact a poison control center or emergency room at once. NOTE: This medicine is only for you. Do not share this medicine with others. What if I miss a dose? Keep appointments for follow-up doses as directed. It is important not to miss your dose. Call your doctor or health care professional if you are unable to keep an appointment. What may interact with this medicine? Interactions have not been studied. This list may not describe all possible interactions. Give your health care provider a list of all the medicines, herbs, non-prescription drugs, or dietary supplements you use. Also tell them if you smoke, drink alcohol, or use illegal drugs. Some items may interact with your medicine. What should I watch for while using this medicine? This drug may make you feel generally unwell. Report any side effects. Continue your course of treatment even though you feel ill unless your doctor tells you to stop. This  medicine can cause serious allergic reactions. To reduce your risk you may need to take medicine before treatment with this medicine. Take your medicine as directed. This medicine can affect the results of blood tests to match your blood type. These changes can last for up to 6 months after the final dose. Your healthcare provider will do blood tests to match your blood type before you start treatment. Tell all of your healthcare providers that you are being treated with this medicine before receiving a blood transfusion. This medicine can affect the results of some tests used to determine treatment response; extra tests may be needed to evaluate response. Do not become pregnant while taking this medicine or for 3 months after stopping it. Women should inform their doctor if they wish to become pregnant or think they might be pregnant. There is a potential for serious side effects to an unborn child. Talk to your health care professional or pharmacist for more information. What side effects may I notice from receiving this medicine? Side effects that you should report to your doctor or health care professional as soon as possible:  allergic reactions like skin rash, itching or hives, swelling of the face, lips, or tongue  breathing problems  chills  cough  dizziness  feeling faint or lightheaded  headache  low blood counts - this medicine may decrease the number of white blood cells, red blood cells and platelets. You may be at increased risk for infections and bleeding.  nausea, vomiting  shortness of breath  signs of decreased platelets or bleeding - bruising, pinpoint red spots on  the skin, black, tarry stools, blood in the urine  signs of decreased red blood cells - unusually weak or tired, feeling faint or lightheaded, falls  signs of infection - fever or chills, cough, sore throat, pain or difficulty passing urine  signs and symptoms of liver injury like dark yellow or brown  urine; general ill feeling or flu-like symptoms; light-colored stools; loss of appetite; right upper belly pain; unusually weak or tired; yellowing of the eyes or skin Side effects that usually do not require medical attention (report to your doctor or health care professional if they continue or are bothersome):  back pain  constipation  diarrhea  joint pain  muscle cramps  pain, tingling, numbness in the hands or feet  swelling of the ankles, feet, hands  tiredness  trouble sleeping This list may not describe all possible side effects. Call your doctor for medical advice about side effects. You may report side effects to FDA at 1-800-FDA-1088. Where should I keep my medicine? This drug is given in a hospital or clinic and will not be stored at home. NOTE: This sheet is a summary. It may not cover all possible information. If you have questions about this medicine, talk to your doctor, pharmacist, or health care provider.  2020 Elsevier/Gold Standard (2019-01-17 18:10:54)  

## 2020-02-07 NOTE — Progress Notes (Signed)
Hematology and Oncology Follow Up Visit  Robert [name] [id number] [birth year] 86 y.o. 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 He is doing quite well and has no complaints at this time.   He denies fatigue and is staying busy working out in his yard.  No fever, chills, n/v, cough, rash, dizziness, SOB, chest pain, palpitations, abdominal pain or changes in bowel or bladder habits.  In August, no M-spike was noted, IgG level was 509 mg/dL and kappa light chains 12.8 mg/L.  He was working on Psychologist, sport and exercise the other day and had some swelling in his left hand afterwards. This resolved on its own and he has no symptoms at this time. Range of motion and grips are good.  No tenderness, numbness or tingling in his extremities. Pedal pulses are 2+. No swelling.  He takes his lasix as needed if he notes fluid retention. I also suggested he start weighing himself every day and take if there is more than a 2 lb weight gain.  No falls or syncopal episodes to report.  No episodes of bleeding. No bruising or petechiae.  He has maintained a good appetite and is staying well hydrated. His weight is stable at 215 lbs.   ECOG Performance Status: 1 - Symptomatic but completely ambulatory  Medications:  Allergies as of 02/07/2020   No Known Allergies     Medication List       Accurate as of February 07, 2020 11:08 AM. If you have any questions, ask your nurse or doctor.        aspirin 81  MG tablet Take 81 mg by mouth daily.   bimatoprost 0.01 % Soln Commonly known as: LUMIGAN Place 1 drop into both eyes at bedtime.   CALTRATE 600+D PLUS PO Take 1 tablet by mouth daily.   carvedilol 12.5 MG tablet Commonly known as: COREG Take 1 tablet (12.5 mg total) by mouth 2 (two) times daily with a meal.   ciclopirox 0.77 % cream Commonly known as: LOPROX APPLY CREAM TOPICALLY ONCE DAILY   cloNIDine 0.1 MG tablet Commonly known as: CATAPRES Take 1 tablet (0.1 mg total) by mouth daily. Take an extra tablet as needed for blood pressure greater than 170/100 (x1 per day)   [employer] VITAMIN B12 2000 MCG tablet Generic drug: cyanocobalamin Take 2,000 mcg by mouth at bedtime.   famciclovir 250 MG tablet Commonly known as: FAMVIR TAKE 1 TABLET DAILY   furosemide 20 MG tablet Commonly known as: LASIX Take 2 tablets (40 mg total) by mouth daily.   HCA LAX-X PO Take by mouth every three (3) days as needed.   losartan 100 MG tablet Commonly known as: COZAAR Take 100 mg by mouth daily.   metolazone 5 MG tablet Commonly known as: ZAROXOLYN Take 1 pill 1 hour prior to taking Lasix daily   montelukast 10 MG tablet Commonly known as: Singulair Take 1 tablet (10 mg total) by mouth at bedtime.   multivitamin with minerals  Tabs tablet Take 1 tablet by mouth daily.   potassium chloride SA 20 MEQ tablet Commonly known as: KLOR-CON Take 20 mEq by mouth daily.   pravastatin 20 MG tablet Commonly known as: PRAVACHOL Take 20 mg by mouth at bedtime.   SYSTANE OP Place 1 drop into both eyes daily as needed (dry eyes).   Vitamin D3 25 MCG (1000 UT) Caps Take 1,000 Units by mouth daily.       Allergies: No Known Allergies  Past Medical History, Surgical history, Social history, and Family History were reviewed and updated.  Review of Systems: All other 10 point review of systems is negative.   Physical Exam:  height is 5\' 11"  (1.803 m) and weight is 215 lb 0.6 oz (97.5  kg). His oral temperature is 97.7 F (36.5 C). His blood pressure is 145/86 (abnormal) and his pulse is 64. His respiration is 17 and oxygen saturation is 100%.   Wt Readings from Last 3 Encounters:  02/07/20 215 lb 0.6 oz (97.5 kg)  02/07/20 215 lb 0.6 oz (97.5 kg)  01/10/20 219 lb (99.3 kg)    Ocular: Sclerae unicteric, pupils equal, round and reactive to light Ear-nose-throat: Oropharynx clear, dentition fair Lymphatic: No cervical, supraclavicular or axillary adenopathy Lungs no rales or rhonchi, good excursion bilaterally Heart regular rate and rhythm, no murmur appreciated Abd soft, nontender, positive bowel sounds, no liver or spleen tip palpated on exam, no fluid wave  MSK no focal spinal tenderness, no joint edema Neuro: non-focal, well-oriented, appropriate affect Breasts: Deferred   Lab Results  Component Value Date   WBC 6.2 02/07/2020   HGB 11.1 (L) 02/07/2020   HCT 33.8 (L) 02/07/2020   MCV 97.4 02/07/2020   PLT 200 02/07/2020   Lab Results  Component Value Date   FERRITIN 1,061 (H) 06/28/2019   IRON 82 06/28/2019   TIBC 281 06/28/2019   UIBC 200 06/28/2019   IRONPCTSAT 29 06/28/2019   Lab Results  Component Value Date   RETICCTPCT 2.2 04/05/2019   RBC 3.47 (L) 02/07/2020   Lab Results  Component Value Date   KPAFRELGTCHN 12.8 01/10/2020   LAMBDASER 11.7 01/10/2020   KAPLAMBRATIO 1.09 01/10/2020   Lab Results  Component Value Date   IGGSERUM 509 (L) 01/10/2020   IGA 16 (L) 01/10/2020   IGMSERUM 28 01/10/2020   Lab Results  Component Value Date   TOTALPROTELP 5.8 (L) 01/10/2020   ALBUMINELP 3.5 01/10/2020   A1GS 0.1 01/10/2020   A2GS 0.8 01/10/2020   BETS 0.9 01/10/2020   BETA2SER 0.3 01/03/2015   GAMS 0.5 01/10/2020   MSPIKE Not Observed 01/10/2020   SPEI Comment 01/10/2020     Chemistry      Component Value Date/Time   NA 138 01/10/2020 1000   NA 137 04/08/2017 1141   NA 138 10/29/2015 1029   K 3.7 01/10/2020 1000   K 3.4  04/08/2017 1141   K 3.9 10/29/2015 1029   CL 103 01/10/2020 1000   CL 102 04/08/2017 1141   CO2 29 01/10/2020 1000   CO2 30 04/08/2017 1141   CO2 27 10/29/2015 1029   BUN 23 01/10/2020 1000   BUN 16 04/08/2017 1141   BUN 13.8 10/29/2015 1029   CREATININE 1.18 01/10/2020 1000   CREATININE 1.3 (H) 04/08/2017 1141   CREATININE 1.1 10/29/2015 1029      Component Value Date/Time   CALCIUM 10.1 01/10/2020 1000   CALCIUM 9.3 04/08/2017 1141   CALCIUM 9.3 10/29/2015 1029  ALKPHOS 53 01/10/2020 1000   ALKPHOS 66 04/08/2017 1141   ALKPHOS 52 10/29/2015 1029   AST 17 01/10/2020 1000   AST 20 10/29/2015 1029   ALT 14 01/10/2020 1000   ALT 21 04/08/2017 1141   ALT 14 10/29/2015 1029   BILITOT 0.9 01/10/2020 1000   BILITOT 0.75 10/29/2015 1029       Impression and Plan: Mr. Zeoli is a very pleasant 86 yo African American gentleman with relapsed IgG kappa myeloma. He has history of stem cell transplant in 2006.  He continues to do well and has no complaints at this time.  His myeloma studies so far, have remained stable.  We will proceed with treatment today as planned.  No Aranesp needed, Hgb 11.1.  We will see him again in a month.  He can contact our office with any questions or concerns.   Laverna Peace, NP 9/15/202111:08 AM

## 2020-02-08 LAB — KAPPA/LAMBDA LIGHT CHAINS
Kappa free light chain: 11.3 mg/L (ref 3.3–19.4)
Kappa, lambda light chain ratio: 1.77 — ABNORMAL HIGH (ref 0.26–1.65)
Lambda free light chains: 6.4 mg/L (ref 5.7–26.3)

## 2020-02-08 LAB — PROTEIN ELECTROPHORESIS, SERUM, WITH REFLEX
A/G Ratio: 1.4 (ref 0.7–1.7)
Albumin ELP: 3.3 g/dL (ref 2.9–4.4)
Alpha-1-Globulin: 0.2 g/dL (ref 0.0–0.4)
Alpha-2-Globulin: 0.8 g/dL (ref 0.4–1.0)
Beta Globulin: 0.9 g/dL (ref 0.7–1.3)
Gamma Globulin: 0.5 g/dL (ref 0.4–1.8)
Globulin, Total: 2.4 g/dL (ref 2.2–3.9)
Total Protein ELP: 5.7 g/dL — ABNORMAL LOW (ref 6.0–8.5)

## 2020-02-08 LAB — IGG, IGA, IGM
IgA: 19 mg/dL — ABNORMAL LOW (ref 61–437)
IgG (Immunoglobin G), Serum: 492 mg/dL — ABNORMAL LOW (ref 603–1613)
IgM (Immunoglobulin M), Srm: 34 mg/dL (ref 15–143)

## 2020-02-12 ENCOUNTER — Other Ambulatory Visit: Payer: Self-pay | Admitting: Cardiology

## 2020-02-12 DIAGNOSIS — C9002 Multiple myeloma in relapse: Secondary | ICD-10-CM

## 2020-02-22 DIAGNOSIS — I10 Essential (primary) hypertension: Secondary | ICD-10-CM | POA: Diagnosis not present

## 2020-02-22 DIAGNOSIS — E785 Hyperlipidemia, unspecified: Secondary | ICD-10-CM | POA: Diagnosis not present

## 2020-02-22 DIAGNOSIS — Z125 Encounter for screening for malignant neoplasm of prostate: Secondary | ICD-10-CM | POA: Diagnosis not present

## 2020-02-26 ENCOUNTER — Other Ambulatory Visit: Payer: Self-pay

## 2020-02-26 DIAGNOSIS — Z95828 Presence of other vascular implants and grafts: Secondary | ICD-10-CM

## 2020-02-26 MED ORDER — AMOXICILLIN 500 MG PO TABS: 2000.0000 mg | ORAL_TABLET | Freq: Once | ORAL | 0 refills | Status: AC

## 2020-02-29 ENCOUNTER — Other Ambulatory Visit: Payer: Self-pay | Admitting: Hematology & Oncology

## 2020-02-29 DIAGNOSIS — E7800 Pure hypercholesterolemia, unspecified: Secondary | ICD-10-CM | POA: Diagnosis not present

## 2020-02-29 DIAGNOSIS — R82998 Other abnormal findings in urine: Secondary | ICD-10-CM | POA: Diagnosis not present

## 2020-02-29 DIAGNOSIS — G4700 Insomnia, unspecified: Secondary | ICD-10-CM | POA: Diagnosis not present

## 2020-02-29 DIAGNOSIS — Z0000 Encounter for general adult medical examination without abnormal findings: Secondary | ICD-10-CM | POA: Diagnosis not present

## 2020-02-29 DIAGNOSIS — C9000 Multiple myeloma not having achieved remission: Secondary | ICD-10-CM | POA: Diagnosis not present

## 2020-02-29 DIAGNOSIS — I10 Essential (primary) hypertension: Secondary | ICD-10-CM | POA: Diagnosis not present

## 2020-02-29 DIAGNOSIS — D630 Anemia in neoplastic disease: Secondary | ICD-10-CM

## 2020-03-06 ENCOUNTER — Inpatient Hospital Stay: Payer: Medicare Other

## 2020-03-06 ENCOUNTER — Encounter: Payer: Self-pay | Admitting: Hematology & Oncology

## 2020-03-06 ENCOUNTER — Other Ambulatory Visit: Payer: Self-pay

## 2020-03-06 ENCOUNTER — Inpatient Hospital Stay (HOSPITAL_BASED_OUTPATIENT_CLINIC_OR_DEPARTMENT_OTHER): Payer: Medicare Other | Admitting: Hematology & Oncology

## 2020-03-06 ENCOUNTER — Inpatient Hospital Stay: Payer: Medicare Other | Attending: Hematology & Oncology

## 2020-03-06 VITALS — BP 168/90 | HR 63 | Temp 97.7°F | Resp 17 | Wt 219.0 lb

## 2020-03-06 DIAGNOSIS — C9000 Multiple myeloma not having achieved remission: Secondary | ICD-10-CM | POA: Diagnosis not present

## 2020-03-06 DIAGNOSIS — K0381 Cracked tooth: Secondary | ICD-10-CM | POA: Diagnosis not present

## 2020-03-06 DIAGNOSIS — Z79899 Other long term (current) drug therapy: Secondary | ICD-10-CM | POA: Diagnosis not present

## 2020-03-06 DIAGNOSIS — N183 Chronic kidney disease, stage 3 unspecified: Secondary | ICD-10-CM

## 2020-03-06 DIAGNOSIS — D631 Anemia in chronic kidney disease: Secondary | ICD-10-CM

## 2020-03-06 DIAGNOSIS — Z5112 Encounter for antineoplastic immunotherapy: Secondary | ICD-10-CM | POA: Insufficient documentation

## 2020-03-06 DIAGNOSIS — C9002 Multiple myeloma in relapse: Secondary | ICD-10-CM | POA: Diagnosis not present

## 2020-03-06 LAB — CMP (CANCER CENTER ONLY)
ALT: 11 U/L (ref 0–44)
AST: 14 U/L — ABNORMAL LOW (ref 15–41)
Albumin: 4 g/dL (ref 3.5–5.0)
Alkaline Phosphatase: 42 U/L (ref 38–126)
Anion gap: 6 (ref 5–15)
BUN: 27 mg/dL — ABNORMAL HIGH (ref 8–23)
CO2: 30 mmol/L (ref 22–32)
Calcium: 10 mg/dL (ref 8.9–10.3)
Chloride: 102 mmol/L (ref 98–111)
Creatinine: 1.26 mg/dL — ABNORMAL HIGH (ref 0.61–1.24)
GFR, Estimated: 52 mL/min — ABNORMAL LOW (ref >60)
Glucose, Bld: 108 mg/dL — ABNORMAL HIGH (ref 70–99)
Potassium: 3.6 mmol/L (ref 3.5–5.1)
Sodium: 138 mmol/L (ref 135–145)
Total Bilirubin: 0.7 mg/dL (ref 0.3–1.2)
Total Protein: 5.9 g/dL — ABNORMAL LOW (ref 6.5–8.1)

## 2020-03-06 LAB — CBC WITH DIFFERENTIAL (CANCER CENTER ONLY)
Abs Immature Granulocytes: 0.02 10*3/uL (ref 0.00–0.07)
Basophils Absolute: 0 10*3/uL (ref 0.0–0.1)
Basophils Relative: 1 %
Eosinophils Absolute: 0.2 10*3/uL (ref 0.0–0.5)
Eosinophils Relative: 3 %
HCT: 33.4 % — ABNORMAL LOW (ref 39.0–52.0)
Hemoglobin: 11.1 g/dL — ABNORMAL LOW (ref 13.0–17.0)
Immature Granulocytes: 0 %
Lymphocytes Relative: 34 %
Lymphs Abs: 1.9 10*3/uL (ref 0.7–4.0)
MCH: 32.6 pg (ref 26.0–34.0)
MCHC: 33.2 g/dL (ref 30.0–36.0)
MCV: 97.9 fL (ref 80.0–100.0)
Monocytes Absolute: 0.7 10*3/uL (ref 0.1–1.0)
Monocytes Relative: 12 %
Neutro Abs: 2.8 10*3/uL (ref 1.7–7.7)
Neutrophils Relative %: 50 %
Platelet Count: 193 10*3/uL (ref 150–400)
RBC: 3.41 MIL/uL — ABNORMAL LOW (ref 4.22–5.81)
RDW: 13.2 % (ref 11.5–15.5)
WBC Count: 5.5 10*3/uL (ref 4.0–10.5)
nRBC: 0 % (ref 0.0–0.2)

## 2020-03-06 LAB — LACTATE DEHYDROGENASE: LDH: 183 U/L (ref 98–192)

## 2020-03-06 MED ORDER — DIPHENHYDRAMINE HCL 25 MG PO CAPS
50.0000 mg | ORAL_CAPSULE | Freq: Once | ORAL | Status: AC
  Administered 2020-03-06: 50 mg via ORAL

## 2020-03-06 MED ORDER — DARATUMUMAB-HYALURONIDASE-FIHJ 1800-30000 MG-UT/15ML ~~LOC~~ SOLN
1800.0000 mg | Freq: Once | SUBCUTANEOUS | Status: AC
  Administered 2020-03-06: 1800 mg via SUBCUTANEOUS
  Filled 2020-03-06: qty 15

## 2020-03-06 MED ORDER — DIPHENHYDRAMINE HCL 25 MG PO CAPS
ORAL_CAPSULE | ORAL | Status: AC
  Filled 2020-03-06: qty 1

## 2020-03-06 MED ORDER — HEPARIN SOD (PORK) LOCK FLUSH 100 UNIT/ML IV SOLN
500.0000 [IU] | Freq: Once | INTRAVENOUS | Status: AC | PRN
  Administered 2020-03-06: 500 [IU]
  Filled 2020-03-06: qty 5

## 2020-03-06 MED ORDER — DEXAMETHASONE 4 MG PO TABS
ORAL_TABLET | ORAL | Status: AC
  Filled 2020-03-06: qty 5

## 2020-03-06 MED ORDER — SODIUM CHLORIDE 0.9% FLUSH
10.0000 mL | INTRAVENOUS | Status: DC | PRN
  Administered 2020-03-06: 10 mL
  Filled 2020-03-06: qty 10

## 2020-03-06 MED ORDER — ACETAMINOPHEN 325 MG PO TABS
650.0000 mg | ORAL_TABLET | Freq: Once | ORAL | Status: AC
  Administered 2020-03-06: 650 mg via ORAL

## 2020-03-06 MED ORDER — DEXAMETHASONE 4 MG PO TABS
20.0000 mg | ORAL_TABLET | Freq: Once | ORAL | Status: AC
  Administered 2020-03-06: 20 mg via ORAL

## 2020-03-06 MED ORDER — ACETAMINOPHEN 325 MG PO TABS
ORAL_TABLET | ORAL | Status: AC
  Filled 2020-03-06: qty 2

## 2020-03-06 NOTE — Progress Notes (Signed)
Hematology and Oncology Follow Up Visit  Robert [name] [id number] 02-23-1934 86 y.o. 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 Aranesp 300 mcg subcu q. 3 weeks for hemoglobin less than 10   Interim History:  Robert [name] is here today for follow-up and treatment.  He really looks great.  He gained a little bit of weight.  However, this really is not all that bad considering where he had been before.  I am just happy that he has responded nicely to the daratumumab.  He has had no problems with the daratumumab.  His myeloma is in remission right now.  There is no monoclonal spike in his blood.  As always, he and his wife go back to his home town of [place].  They have some property there.  As it was fun talking to him about this.  His main problem has been that there is a cracked tooth.  This appears to be on the left upper quadrant of his mouth.  I think it is #13, which I think is the bicuspid.  We will have to see about making referral to our dental clinic to see if they can do someone about this.  He has had no problems with fever.  He has had no change in bowel or bladder habits.  He says his blood pressure is under better control.  There is now much swelling in his legs.  There has been no problems with headache.  He has had no bleeding.  He has had no cough or shortness of breath.  He has had a coronavirus vaccines.  I told him that he would be a candidate for the booster injection whenever that is available.  Overall, his performance status is ECOG  1.   Medications:  Allergies as of 03/06/2020   No Known Allergies     Medication List       Accurate as of March 06, 2020 10:12 AM. If you have any questions, ask your nurse or doctor.        STOP taking these medications   pravastatin 20 MG tablet Commonly known as: PRAVACHOL Stopped by: Volanda Napoleon, MD     TAKE these medications   amoxicillin 500 MG tablet Commonly known as: AMOXIL Take 2,000 mg by mouth once. Before dental procedures.   amoxicillin 500 MG capsule Commonly known as: AMOXIL Take 500 mg by mouth 3 (three) times daily.   aspirin 81 MG tablet Take 81 mg by mouth daily.   bimatoprost 0.01 % Soln Commonly known as: LUMIGAN Place 1 drop into both eyes at bedtime.   CALTRATE 600+D PLUS PO Take 1 tablet by mouth daily.   carvedilol 12.5 MG tablet Commonly known as: COREG Take 1 tablet (12.5 mg total) by mouth 2 (two) times daily with a meal.   ciclopirox 0.77 % cream Commonly known as: LOPROX APPLY CREAM TOPICALLY ONCE DAILY   cloNIDine 0.1 MG tablet Commonly known as: CATAPRES TAKE 1 TABLET DAILY. TAKE AN EXTRA TABLET AS NEEDED FOR BLOOD PRESSURE  GREATER THAN 170/100 (ONCE PER DAY)   [employer] VITAMIN B12 2000 MCG tablet Generic drug: cyanocobalamin Take 2,000 mcg by mouth at bedtime.   famciclovir 250 MG tablet Commonly known as: FAMVIR TAKE 1 TABLET DAILY   furosemide 20 MG tablet Commonly known as: LASIX Take 2 tablets (40 mg total) by mouth daily.   HCA LAX-X PO Take by mouth every three (3) days as needed.   losartan 100 MG tablet Commonly known as: COZAAR Take 100 mg by mouth daily.   metolazone 5 MG tablet Commonly known as: ZAROXOLYN TAKE 1 TABLET 1 HOUR PRIOR TO TAKING LASIX DAILY   montelukast 10 MG tablet Commonly known as: Singulair Take 1 tablet (10 mg total) by mouth at bedtime.   multivitamin with minerals Tabs tablet Take 1 tablet by mouth daily.   potassium chloride SA 20 MEQ tablet Commonly known as:  KLOR-CON Take 20 mEq by mouth daily.   rosuvastatin 20 MG tablet Commonly known as: CRESTOR Take 20 mg by mouth at bedtime.   SYSTANE OP Place 1 drop into both eyes daily as needed (dry eyes).   Vitamin D3 25 MCG (1000 UT) Caps Take 1,000 Units by mouth daily.       Allergies: No Known Allergies  Past Medical History, Surgical history, Social history, and Family History were reviewed and updated.  Review of Systems: Review of Systems  Constitutional: Negative.   HENT: Negative.   Eyes: Negative.   Respiratory: Negative.   Cardiovascular: Negative.   Gastrointestinal: Negative.   Genitourinary: Negative.   Musculoskeletal: Negative.   Skin: Negative.   Neurological: Negative.   Endo/Heme/Allergies: Negative.   Psychiatric/Behavioral: Negative.       Physical Exam:  weight is 219 lb (99.3 kg). His oral temperature is 97.7 F (36.5 C). His blood pressure is 168/90 (abnormal) and his pulse is 63. His respiration is 17.   Wt Readings from Last 3 Encounters:  03/06/20 219 lb (99.3 kg)  02/07/20 215 lb 0.6 oz (97.5 kg)  02/07/20 215 lb 0.6 oz (97.5 kg)    Physical Exam Vitals reviewed.  HENT:     Head: Normocephalic and atraumatic.  Eyes:     Pupils: Pupils are equal, round, and reactive to light.  Cardiovascular:     Rate and Rhythm: Normal rate and regular rhythm.     Heart sounds: Normal heart sounds.  Pulmonary:     Effort: Pulmonary effort is normal.     Breath sounds: Normal breath sounds.  Abdominal:     General: Bowel sounds are normal.     Palpations: Abdomen is soft.  Musculoskeletal:        General: No tenderness or deformity. Normal range of motion.     Cervical back: Normal range of motion.  Lymphadenopathy:     Cervical: No cervical adenopathy.  Skin:    General: Skin is warm and dry.     Findings: No erythema or rash.  Neurological:     Mental Status: He is alert and oriented to person, place, and time.  Psychiatric:        Behavior:  Behavior normal.        Thought Content: Thought content normal.        Judgment: Judgment normal.      Lab Results  Component Value Date   WBC 5.5 03/06/2020   HGB 11.1 (L) 03/06/2020   HCT 33.4 (L) 03/06/2020   MCV 97.9 03/06/2020   PLT 193 03/06/2020   Lab Results  Component Value Date   FERRITIN 1,061 (H) 06/28/2019   IRON 82 06/28/2019   TIBC 281 06/28/2019   UIBC 200 06/28/2019   IRONPCTSAT 29 06/28/2019   Lab Results  Component Value Date   RETICCTPCT 2.2 04/05/2019   RBC 3.41 (L) 03/06/2020   Lab Results  Component Value Date   KPAFRELGTCHN 11.3 02/07/2020   LAMBDASER 6.4 02/07/2020   KAPLAMBRATIO 1.77 (H) 02/07/2020   Lab Results  Component Value Date   IGGSERUM 492 (L) 02/07/2020   IGA 19 (L) 02/07/2020   IGMSERUM 34 02/07/2020   Lab Results  Component Value Date   TOTALPROTELP 5.7 (L) 02/07/2020   ALBUMINELP 3.3 02/07/2020   A1GS 0.2 02/07/2020   A2GS 0.8 02/07/2020   BETS 0.9 02/07/2020   BETA2SER 0.3 01/03/2015   GAMS 0.5 02/07/2020   MSPIKE Not Observed 02/07/2020   SPEI Comment 01/10/2020     Chemistry      Component Value Date/Time   NA 138 03/06/2020 0928   NA 137 04/08/2017 1141   NA 138 10/29/2015 1029   K 3.6 03/06/2020 0928   K 3.4 04/08/2017 1141   K 3.9 10/29/2015 1029   CL 102 03/06/2020 0928   CL 102 04/08/2017 1141   CO2 30 03/06/2020 0928   CO2 30 04/08/2017 1141   CO2 27 10/29/2015 1029   BUN 27 (H) 03/06/2020 0928   BUN 16 04/08/2017 1141   BUN 13.8 10/29/2015 1029   CREATININE 1.26 (H) 03/06/2020 0928   CREATININE 1.3 (H) 04/08/2017 1141   CREATININE 1.1 10/29/2015 1029      Component Value Date/Time   CALCIUM 10.0 03/06/2020 0928   CALCIUM 9.3 04/08/2017 1141   CALCIUM 9.3 10/29/2015 1029   ALKPHOS 42 03/06/2020 0928   ALKPHOS 66 04/08/2017 1141   ALKPHOS 52 10/29/2015 1029   AST 14 (L) 03/06/2020 0928   AST 20 10/29/2015 1029   ALT 11 03/06/2020 0928   ALT 21 04/08/2017 1141   ALT 14 10/29/2015 1029    BILITOT 0.7 03/06/2020 0928   BILITOT 0.75 10/29/2015 1029       Impression and Plan: Robert [name] is a very pleasant 86 yo African American gentleman with relapsed IgG kappa myeloma. He has history of stem cell transplant in 2006.   He has tolerated Daratumumab well and has had a nice response.  Hopefully, he will maintain his response to daratumumab.  We really need to get the tooth situation figured out.  Much of this is from the [place].  We will have to hold the Zometa for right now.  He got the last Zometa I think back in August.  I will have to see about getting him in to see [place] in another month.     Volanda Napoleon, MD 10/13/202110:12 AM

## 2020-03-06 NOTE — Progress Notes (Signed)
Pt stable upon discharge 

## 2020-03-07 LAB — KAPPA/LAMBDA LIGHT CHAINS
Kappa free light chain: 11.4 mg/L (ref 3.3–19.4)
Kappa, lambda light chain ratio: 2.33 — ABNORMAL HIGH (ref 0.26–1.65)
Lambda free light chains: 4.9 mg/L — ABNORMAL LOW (ref 5.7–26.3)

## 2020-03-07 LAB — IGG, IGA, IGM
IgA: 20 mg/dL — ABNORMAL LOW (ref 61–437)
IgG (Immunoglobin G), Serum: 518 mg/dL — ABNORMAL LOW (ref 603–1613)
IgM (Immunoglobulin M), Srm: 27 mg/dL (ref 15–143)

## 2020-03-08 LAB — PROTEIN ELECTROPHORESIS, SERUM
A/G Ratio: 1.7 (ref 0.7–1.7)
Albumin ELP: 3.6 g/dL (ref 2.9–4.4)
Alpha-1-Globulin: 0.2 g/dL (ref 0.0–0.4)
Alpha-2-Globulin: 0.7 g/dL (ref 0.4–1.0)
Beta Globulin: 0.9 g/dL (ref 0.7–1.3)
Gamma Globulin: 0.4 g/dL (ref 0.4–1.8)
Globulin, Total: 2.1 g/dL — ABNORMAL LOW (ref 2.2–3.9)
Total Protein ELP: 5.7 g/dL — ABNORMAL LOW (ref 6.0–8.5)

## 2020-03-09 ENCOUNTER — Ambulatory Visit: Payer: Medicare Other | Attending: Internal Medicine

## 2020-03-09 DIAGNOSIS — Z23 Encounter for immunization: Secondary | ICD-10-CM

## 2020-03-09 NOTE — Progress Notes (Signed)
   Covid-19 Vaccination Clinic  Name:  Robert [name]    MRN: [id number] DOB: 1934/01/22  03/09/2020  Robert [name] was observed post Covid-19 immunization for 15 minutes without incident. He was provided with Vaccine Information Sheet and instruction to access the V-Safe system.   Robert [name] was instructed to call 911 with any severe reactions post vaccine: Marland Kitchen Difficulty breathing  . Swelling of face and throat  . A fast heartbeat  . A bad rash all over body  . Dizziness and weakness

## 2020-03-13 DIAGNOSIS — H401131 Primary open-angle glaucoma, bilateral, mild stage: Secondary | ICD-10-CM | POA: Diagnosis not present

## 2020-03-18 ENCOUNTER — Ambulatory Visit (HOSPITAL_COMMUNITY): Payer: Self-pay | Admitting: Dentistry

## 2020-03-18 ENCOUNTER — Other Ambulatory Visit: Payer: Self-pay

## 2020-03-18 DIAGNOSIS — K0401 Reversible pulpitis: Secondary | ICD-10-CM | POA: Diagnosis not present

## 2020-03-18 DIAGNOSIS — K08199 Complete loss of teeth due to other specified cause, unspecified class: Secondary | ICD-10-CM | POA: Diagnosis not present

## 2020-03-18 DIAGNOSIS — K044 Acute apical periodontitis of pulpal origin: Secondary | ICD-10-CM | POA: Diagnosis not present

## 2020-03-18 NOTE — Progress Notes (Signed)
DENTAL CONSULTATION LIMITED EXAM NOTE  Date of Consultation:  03/18/2020 Referring Provider:                  Dr. Burney Gauze, MD  Patient Name:   Robert [name] Date of Birth:   12-Dec-1933 Medical Record Number: [id number]  ____________________________   COVID 19 SCREENING: The patient denies symptoms concerning for COVID-19 infection including fever, chills, cough, or newly developed shortness of breath.  VITAL SIGNS: BP (!) 145/78 (BP Location: Right Arm)   Pulse 66   Temp 98 F (36.7 C)   CHIEF COMPLAINT: "I have this tooth #13 that my dentist said needs to be taken out.  I wasn't able to get an appointment with the oral surgeon until January."  HPI: Robert [name] is a very pleasant 86 y.o. male with h/o multiple myeloma in remission on current maintenance therapy of Daratumumab, Zometa 35m IV q 4 months (holding on October and next dose scheduled for 03/2020 for toothache) and Aranesp for anemia who presents today for a dental evaluation.   Dental History: Patient reports that about 130mogo he developed pain in the top left quadrant of his mouth when he would bite down.  He states that it feels like a part of his tooth is going to "fall out" or is coming loose.  He is currently avoiding chewing on the left side altogether, but is otherwise doing well with nutrition and consumption of food.  He went to his regular dentist at [place] they told him he had a cracked tooth and that #13 needed to be extracted.  The subsequently referred him to an oral surgeon next door to their office, and when he tried to schedule an appointment they did not have anything available until January 2022.  He currently denies any dental/oral pain or sensitivity, only discomfort and some minor pain when chewing or biting on the left side.  He denies any facial swelling or difficulty opening/swallowing or odynophagia.  He has not had to take anything for the pain and is able to manage it on his  own simply by not chewing on the left side.   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   Goals of care, counseling/discussion 05/12/2018   Syncope 12/24/2017   Groin lump 12/28/2016   Lytic bone lesions on xray 03/17/2016   Cerumen debris on tympanic membrane 11/28/2015   White coat syndrome with diagnosis of hypertension 11/28/2015   Dyslipidemia 04/08/2015   Myeloma ([facility]02/11/2011   ED (erectile dysfunction) 06/02/2010   Benign essential hypertension 03/19/2010   Multiple myeloma in remission ([facility]10/26/2011   B12 deficiency 02/17/2010   S/P autologous bone marrow transplantation ([facility]12/10/2004   IgG multiple myeloma ([facility]03/05/2004   Past Medical History:  Diagnosis Date   Anemia of chronic renal failure, stage 3 (moderate) ([facility]/14/2020   Arthritis    B12 deficiency 02/17/2010   Benign essential hypertension 03/19/2010   Cerumen debris on tympanic membrane 11/28/2015   Dyslipidemia 04/08/2015   ED (erectile dysfunction) 06/02/2010   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   Glaucoma of both eyes 10/10/2019   Goals of care, counseling/discussion 05/12/2018   Groin lump 12/28/2016   Hyperlipidemia  Hypertension    IgG multiple myeloma ([place]) 07/23/2004   Iron deficiency anemia 10/07/2018   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   Lytic bone lesions on xray 03/17/2016   Multiple myeloma ([place])    2006   Multiple myeloma in remission ([place]) 03/19/2010   Myeloma ([place]) 07/02/2011   S/P autologous bone marrow transplantation ([place]) 04/29/2005   Swelling of both lower extremities 10/18/2018   Syncope 12/24/2017   White coat syndrome with diagnosis of hypertension 11/28/2015   Past Surgical History:  Procedure  Laterality Date   CATARACT EXTRACTION BILATERAL W/ ANTERIOR VITRECTOMY     IR IMAGING GUIDED PORT INSERTION  05/30/2018   LIMBAL STEM CELL TRANSPLANT     ALLERGIES: No Known Allergies  Current Outpatient Medications  Medication Sig Dispense Refill   amoxicillin (AMOXIL) 500 MG capsule Take 500 mg by mouth 3 (three) times daily.     amoxicillin (AMOXIL) 500 MG tablet Take 2,000 mg by mouth once. Before dental procedures.     aspirin 81 MG tablet Take 81 mg by mouth daily.     bimatoprost (LUMIGAN) 0.01 % SOLN Place 1 drop into both eyes at bedtime.      Calcium Carbonate-Vit D-Min (CALTRATE 600+D PLUS PO) Take 1 tablet by mouth daily.     carvedilol (COREG) 12.5 MG tablet Take 1 tablet (12.5 mg total) by mouth 2 (two) times daily with a meal. 90 tablet 1   Cholecalciferol (VITAMIN D3) 1000 UNITS CAPS Take 1,000 Units by mouth daily.      ciclopirox (LOPROX) 0.77 % cream APPLY CREAM TOPICALLY ONCE DAILY     cloNIDine (CATAPRES) 0.1 MG tablet TAKE 1 TABLET DAILY. TAKE AN EXTRA TABLET AS NEEDED FOR BLOOD PRESSURE GREATER THAN 170/100 (ONCE PER DAY) 90 tablet 7   cyanocobalamin ([employer] VITAMIN B12) 2000 MCG tablet Take 2,000 mcg by mouth at bedtime.      famciclovir (FAMVIR) 250 MG tablet TAKE 1 TABLET DAILY 90 tablet 3   furosemide (LASIX) 20 MG tablet Take 2 tablets (40 mg total) by mouth daily. 180 tablet 1   losartan (COZAAR) 100 MG tablet Take 100 mg by mouth daily.     metolazone (ZAROXOLYN) 5 MG tablet TAKE 1 TABLET 1 HOUR PRIOR TO TAKING LASIX DAILY 180 tablet 3   montelukast (SINGULAIR) 10 MG tablet Take 1 tablet (10 mg total) by mouth at bedtime. 30 tablet 5   Multiple Vitamin (MULITIVITAMIN WITH MINERALS) TABS Take 1 tablet by mouth daily.     Polyethyl Glycol-Propyl Glycol (SYSTANE OP) Place 1 drop into both eyes daily as needed (dry eyes).      potassium chloride SA (K-DUR,KLOR-CON) 20 MEQ tablet Take 20 mEq by mouth daily.     rosuvastatin (CRESTOR) 20 MG tablet Take 20 mg by mouth at  bedtime.     Sennosides (HCA LAX-X PO) Take by mouth every three (3) days as needed.     No current facility-administered medications for this visit.   Facility-Administered Medications Ordered in Other Visits  Medication Dose Route Frequency Provider Last Rate Last Admin   sodium chloride flush (NS) 0.9 % injection 10 mL  10 mL Intravenous PRN Volanda Napoleon, MD   10 mL at 07/28/18 1313   sodium chloride flush (NS) 0.9 % injection 10 mL  10 mL Intravenous PRN Volanda Napoleon, MD   10 mL at 09/01/18 0931   sodium chloride flush (NS) 0.9 % injection 10 mL  10 mL Intravenous PRN Volanda Napoleon, MD  10 mL at 09/08/18 1009   LABS: Lab Results  Component Value Date   WBC 5.5 03/06/2020   HGB 11.1 (L) 03/06/2020   HCT 33.4 (L) 03/06/2020   MCV 97.9 03/06/2020   PLT 193 03/06/2020      Component Value Date/Time   NA 138 03/06/2020 0928   NA 137 04/08/2017 1141   NA 138 10/29/2015 1029   K 3.6 03/06/2020 0928   K 3.4 04/08/2017 1141   K 3.9 10/29/2015 1029   CL 102 03/06/2020 0928   CL 102 04/08/2017 1141   CO2 30 03/06/2020 0928   CO2 30 04/08/2017 1141   CO2 27 10/29/2015 1029   GLUCOSE 108 (H) 03/06/2020 0928   GLUCOSE 100 04/08/2017 1141   BUN 27 (H) 03/06/2020 0928   BUN 16 04/08/2017 1141   BUN 13.8 10/29/2015 1029   CREATININE 1.26 (H) 03/06/2020 0928   CREATININE 1.3 (H) 04/08/2017 1141   CREATININE 1.1 10/29/2015 1029   CALCIUM 10.0 03/06/2020 0928   CALCIUM 9.3 04/08/2017 1141   CALCIUM 9.3 10/29/2015 1029   GFRNONAA 52 (L) 03/06/2020 0928   GFRAA >60 02/07/2020 1030   Lab Results  Component Value Date   INR 1.1 09/15/2018   INR 0.93 05/30/2018   INR 1.03 12/24/2017   No results found for: PTT  Social History   Socioeconomic History   Marital status: Married    Spouse name: Not on file   Number of children: Not on file   Years of education: Not on file   Highest education level: Not on file  Occupational History   Not on file  Tobacco Use    Smoking status: Former Smoker    Packs/day: 4.00    Years: 9.00    Pack years: 36.00    Types: Cigars, Cigarettes    Start date: 07/08/1979    Quit date: 07/07/1988    Years since quitting: 31.7   Smokeless tobacco: Never Used   Tobacco comment: quit 24 years ago  Vaping Use   Vaping Use: Never used  Substance and Sexual Activity   Alcohol use: Yes    Alcohol/week: 6.0 standard drinks    Types: 6 Glasses of wine per week    Comment: very seldom   Drug use: No   Sexual activity: Not on file  Other Topics Concern   Not on file  Social History Narrative   Not on file   Social Determinants of Health   Financial Resource Strain:    Difficulty of Paying Living Expenses: Not on file  Food Insecurity:    Worried About [place] in the Last Year: Not on file   Ran Out of Food in the Last Year: Not on file  Transportation Needs:    Lack of Transportation (Medical): Not on file   Lack of Transportation (Non-Medical): Not on file  Physical Activity:    Days of Exercise per Week: Not on file   Minutes of Exercise per Session: Not on file  Stress:    Feeling of Stress : Not on file  Social Connections:    Frequency of Communication with Friends and Family: Not on file   Frequency of Social Gatherings with Friends and Family: Not on file   Attends Religious Services: Not on file   Active Member of Clubs or Organizations: Not on file   Attends Archivist Meetings: Not on file   Marital Status: Not on file  Intimate Partner Violence:  Fear of Current or Ex-Partner: Not on file   Emotionally Abused: Not on file   Physically Abused: Not on file   Sexually Abused: Not on file   Family History  Problem Relation Age of Onset   Heart attack Father    Heart disease Father    Throat cancer Mother    Diabetes Brother    Colon cancer Neg Hx      REVIEW OF SYSTEMS: Reviewed with the patient as per HPI. PSYCH: Patient denies having dental phobia.   DENTAL  EXAMINATION: GENERAL: The patient is well-appearing and in no apparent distress. HEAD AND NECK:  No lymphadenopathy, edema, erythema or trismus.   INTRAORAL EXAM: Soft tissues appear well-perfused.  Good salivary flow noted upon examination.  No evidence of intraoral edema, erythema or abscess. DENTITION: Overall good remaining dentition.  Missing teeth, heavily restored dentition, attrition/erosion.  The patient is maintaining fair oral hygiene. #13 large existing composite restoration. PERIODONTAL: Inflamed, erythematous gingival tissue.  Gingival recession, Class I mobility on tooth #13.  Generalized moderate plaque and calculus accumulation.  #13 Probing Depths: ML: 67m, MB: 336m B: 6m18mDB: 6mm71mL: 5mm,36m 3mm. 40mTAL CARIES/SUBOPTIMAL RESTORATIONS: Patient has heavily restored remaining dentition. ENDODONTIC: (+) Teeth with previous RCT.  #13 symptomatic irreversible pulpitis, cracked tooth syndrome. CROWN/BRIDGE: (+) Multiple teeth with existing crown and bridge work. PROSTHODONTIC: Patient denies having a removable prothesis. OCCLUSION: Class I molar occlusion.  Tested teeth #s 12, 13 and 14 for air/water, percussion, palpation, mobility, caries and probing depths with no clinically significant findings.  RADIOGRAPHIC INTERPRETATION (PA and BW): Localized mild horizontal bone loss. Missing teeth, existing restorations.  #20 previously RCT treated with existing crown.  Multiple other teeth with existing crowns.  #13 large existing composite/core build-up approximating the pulp. #13 periapical radiolucency.   ASSESSMENT: 1. Multiple Myeloma in remission, h/o Stem Cell Transplant 2. H/o Bisphosphonate Therapy (Zometa) 3. HTN 4. Acute apical periodontitis 5. Symptomatic irreversible pulpitis 6. Missing teeth 7. Gingival recession   PLAN/RECOMMENDATIONS: 1.  I discussed the risks (including extraction or dental surgery with h/o Zometa therapy), benefits, and complications  (including MRONJ) of various treatment options with the patient in relationship to his medical and dental conditions. We discussed various treatment options to include no treatment, extraction with alveoloplasty, pre-prosthetic surgery as indicated, periodontal therapy, dental restorations, root canal therapy, crown and bridge therapy, implant therapy, and replacement of missing teeth as indicated. I explained to the patient that due to his h/o Zometa therapy, I would recommend RCT if the tooth is restorable with crown, however if the crack is a vertical root fracture and the Endodontist deems it non-restorable (and is not able to even bury the root, I would recommend referral to an oral surgeon for extraction of the tooth.  I explained that based on our clinical and radiographic findings today, it is impossible to make a definitive diagnosis, as the tooth is not obviously cracked in half or mobile, and he did not respond clinically significant to any of the tests.   The patient verbalized understanding of all options, and currently wishes to proceed with Endodontic Consultation and RCT/crown or RCT/bury the root if tooth is not vertically fractured.   2.  Discussion of findings with medical team and coordination of future medical and dental care as needed.  Plan to hold Zometa until treatment complete. Referral to Endodontist for a Consultation and proceed with RCT if indicated, or referral to OMFS should tooth be non-restorable.   I  spent in excess of 120 minutes during the conduct of this consultation and >50% of this time involved direct face-to-face encounter for counseling and/or coordination of the patient's care.    Provider: Juliane Poot. Benson [place], DMD

## 2020-04-03 ENCOUNTER — Inpatient Hospital Stay: Payer: Medicare Other

## 2020-04-03 ENCOUNTER — Telehealth: Payer: Self-pay

## 2020-04-03 ENCOUNTER — Encounter: Payer: Self-pay | Admitting: Hematology & Oncology

## 2020-04-03 ENCOUNTER — Other Ambulatory Visit: Payer: Self-pay

## 2020-04-03 ENCOUNTER — Inpatient Hospital Stay (HOSPITAL_BASED_OUTPATIENT_CLINIC_OR_DEPARTMENT_OTHER): Payer: Medicare Other | Admitting: Hematology & Oncology

## 2020-04-03 ENCOUNTER — Inpatient Hospital Stay: Payer: Medicare Other | Attending: Hematology & Oncology

## 2020-04-03 VITALS — BP 156/74 | HR 56 | Temp 97.5°F | Resp 18

## 2020-04-03 VITALS — BP 163/83 | HR 60 | Temp 97.7°F | Resp 20 | Wt 218.1 lb

## 2020-04-03 DIAGNOSIS — Z79899 Other long term (current) drug therapy: Secondary | ICD-10-CM | POA: Insufficient documentation

## 2020-04-03 DIAGNOSIS — Z5112 Encounter for antineoplastic immunotherapy: Secondary | ICD-10-CM | POA: Insufficient documentation

## 2020-04-03 DIAGNOSIS — C9000 Multiple myeloma not having achieved remission: Secondary | ICD-10-CM

## 2020-04-03 DIAGNOSIS — C9002 Multiple myeloma in relapse: Secondary | ICD-10-CM | POA: Insufficient documentation

## 2020-04-03 LAB — CMP (CANCER CENTER ONLY)
ALT: 11 U/L (ref 0–44)
AST: 15 U/L (ref 15–41)
Albumin: 4.1 g/dL (ref 3.5–5.0)
Alkaline Phosphatase: 42 U/L (ref 38–126)
Anion gap: 7 (ref 5–15)
BUN: 24 mg/dL — ABNORMAL HIGH (ref 8–23)
CO2: 29 mmol/L (ref 22–32)
Calcium: 10 mg/dL (ref 8.9–10.3)
Chloride: 100 mmol/L (ref 98–111)
Creatinine: 1.18 mg/dL (ref 0.61–1.24)
GFR, Estimated: >60 mL/min (ref >60)
Glucose, Bld: 101 mg/dL — ABNORMAL HIGH (ref 70–99)
Potassium: 3.6 mmol/L (ref 3.5–5.1)
Sodium: 136 mmol/L (ref 135–145)
Total Bilirubin: 0.9 mg/dL (ref 0.3–1.2)
Total Protein: 6 g/dL — ABNORMAL LOW (ref 6.5–8.1)

## 2020-04-03 LAB — CBC WITH DIFFERENTIAL (CANCER CENTER ONLY)
Abs Immature Granulocytes: 0.01 10*3/uL (ref 0.00–0.07)
Basophils Absolute: 0 10*3/uL (ref 0.0–0.1)
Basophils Relative: 1 %
Eosinophils Absolute: 0.2 10*3/uL (ref 0.0–0.5)
Eosinophils Relative: 4 %
HCT: 32.4 % — ABNORMAL LOW (ref 39.0–52.0)
Hemoglobin: 11 g/dL — ABNORMAL LOW (ref 13.0–17.0)
Immature Granulocytes: 0 %
Lymphocytes Relative: 34 %
Lymphs Abs: 1.9 10*3/uL (ref 0.7–4.0)
MCH: 32.7 pg (ref 26.0–34.0)
MCHC: 34 g/dL (ref 30.0–36.0)
MCV: 96.4 fL (ref 80.0–100.0)
Monocytes Absolute: 0.6 10*3/uL (ref 0.1–1.0)
Monocytes Relative: 11 %
Neutro Abs: 2.8 10*3/uL (ref 1.7–7.7)
Neutrophils Relative %: 50 %
Platelet Count: 189 10*3/uL (ref 150–400)
RBC: 3.36 MIL/uL — ABNORMAL LOW (ref 4.22–5.81)
RDW: 13 % (ref 11.5–15.5)
WBC Count: 5.6 10*3/uL (ref 4.0–10.5)
nRBC: 0 % (ref 0.0–0.2)

## 2020-04-03 LAB — LACTATE DEHYDROGENASE: LDH: 176 U/L (ref 98–192)

## 2020-04-03 MED ORDER — ACETAMINOPHEN 325 MG PO TABS
ORAL_TABLET | ORAL | Status: AC
  Filled 2020-04-03: qty 2

## 2020-04-03 MED ORDER — DEXAMETHASONE 4 MG PO TABS
ORAL_TABLET | ORAL | Status: AC
  Filled 2020-04-03: qty 5

## 2020-04-03 MED ORDER — DIPHENHYDRAMINE HCL 25 MG PO CAPS
50.0000 mg | ORAL_CAPSULE | Freq: Once | ORAL | Status: AC
  Administered 2020-04-03: 50 mg via ORAL

## 2020-04-03 MED ORDER — HEPARIN SOD (PORK) LOCK FLUSH 100 UNIT/ML IV SOLN
500.0000 [IU] | Freq: Once | INTRAVENOUS | Status: AC | PRN
  Administered 2020-04-03: 500 [IU]
  Filled 2020-04-03: qty 5

## 2020-04-03 MED ORDER — SODIUM CHLORIDE 0.9% FLUSH
10.0000 mL | INTRAVENOUS | Status: DC | PRN
  Administered 2020-04-03: 10 mL
  Filled 2020-04-03: qty 10

## 2020-04-03 MED ORDER — DIPHENHYDRAMINE HCL 25 MG PO CAPS
ORAL_CAPSULE | ORAL | Status: AC
  Filled 2020-04-03: qty 2

## 2020-04-03 MED ORDER — ACETAMINOPHEN 325 MG PO TABS
650.0000 mg | ORAL_TABLET | Freq: Once | ORAL | Status: AC
  Administered 2020-04-03: 650 mg via ORAL

## 2020-04-03 MED ORDER — DARATUMUMAB-HYALURONIDASE-FIHJ 1800-30000 MG-UT/15ML ~~LOC~~ SOLN
1800.0000 mg | Freq: Once | SUBCUTANEOUS | Status: AC
  Administered 2020-04-03: 1800 mg via SUBCUTANEOUS
  Filled 2020-04-03: qty 15

## 2020-04-03 MED ORDER — DEXAMETHASONE 4 MG PO TABS
20.0000 mg | ORAL_TABLET | Freq: Once | ORAL | Status: AC
  Administered 2020-04-03: 20 mg via ORAL

## 2020-04-03 NOTE — Patient Instructions (Signed)
[facility] Discharge Instructions for Patients Receiving Chemotherapy  Today you received the following chemotherapy agents Darzalex Faspro  To help prevent nausea and vomiting after your treatment, we encourage you to take your nausea medication as prescribed by MD.   If you develop nausea and vomiting that is not controlled by your nausea medication, call the clinic.   BELOW ARE SYMPTOMS THAT SHOULD BE REPORTED IMMEDIATELY:  *FEVER GREATER THAN 100.5 F  *CHILLS WITH OR WITHOUT FEVER  NAUSEA AND VOMITING THAT IS NOT CONTROLLED WITH YOUR NAUSEA MEDICATION  *UNUSUAL SHORTNESS OF BREATH  *UNUSUAL BRUISING OR BLEEDING  TENDERNESS IN MOUTH AND THROAT WITH OR WITHOUT PRESENCE OF ULCERS  *URINARY PROBLEMS  *BOWEL PROBLEMS  UNUSUAL RASH Items with * indicate a potential emergency and should be followed up as soon as possible.  Feel free to call the clinic should you have any questions or concerns. The clinic phone number is [phone].  Please show the CHEMO ALERT CARD at check-in to the Emergency Department and triage nurse.   

## 2020-04-03 NOTE — Progress Notes (Signed)
Pt. refused to wait one hour post Darzalex injection. Stable and asymptomatic at the time of release.

## 2020-04-03 NOTE — Progress Notes (Signed)
Hematology and Oncology Follow Up Visit  Robert [name] [id number] 1934/01/24 86 y.o. 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 Aranesp 300 mcg subcu q. 3 weeks for hemoglobin less than 10   Interim History:  Robert [name] is here today for follow-up and treatment.  He really looks great.  He did have a root canal done for the bicuspid in the left upper/maxillary region.  Because of this, we will hold his Zometa probably until March of next year.  He has done very well with the daratumumab.  There has been no monoclonal spike in his blood.  His last IgG level was 518 mg/dL.  The kappa light chain was 1.1 mg/dL.  His weight is about the same.  He is trying to exercise little bit more.  Hopefully, he will be able to lose a little bit of weight.  He will have a nice Thanksgiving with his family.  He has had no problems with bowels or bladder.  He has had no cough.  He has had no nausea or vomiting.  There has been no leg swelling.  He has had no rashes.  I did wish me happy Veterans Day as he was in the [employer].  Overall, his performance status is ECOG 1.   Medications:  Allergies as of 04/03/2020   No Known Allergies     Medication List       Accurate as of April 03, 2020 11:55 AM. If you have any questions, ask your nurse or doctor.        amoxicillin 500 MG tablet Commonly known as: AMOXIL Take 2,000 mg by mouth once. Before dental procedures.   amoxicillin 500 MG capsule Commonly known as: AMOXIL Take 500 mg by mouth 3 (three)  times daily.   aspirin 81 MG tablet Take 81 mg by mouth daily.   bimatoprost 0.01 % Soln Commonly known as: LUMIGAN Place 1 drop into both eyes at bedtime.   CALTRATE 600+D PLUS PO Take 1 tablet by mouth daily.   carvedilol 12.5 MG tablet Commonly known as: COREG Take 1 tablet (12.5 mg total) by mouth 2 (two) times daily with a meal.   ciclopirox 0.77 % cream Commonly known as: LOPROX APPLY CREAM TOPICALLY ONCE DAILY   cloNIDine 0.1 MG tablet Commonly known as: CATAPRES TAKE 1 TABLET DAILY. TAKE AN EXTRA TABLET AS NEEDED FOR BLOOD PRESSURE GREATER THAN 170/100 (ONCE PER DAY)   [employer] VITAMIN B12 2000 MCG tablet Generic drug: cyanocobalamin Take 2,000 mcg by mouth at bedtime.   famciclovir 250 MG tablet Commonly known as: FAMVIR TAKE 1 TABLET DAILY   furosemide 20 MG tablet Commonly known as: LASIX Take 2 tablets (40 mg total) by mouth daily.   HCA LAX-X PO Take by mouth every three (3) days as needed.   losartan 100 MG tablet Commonly known as: COZAAR Take 100 mg by mouth daily.   metolazone 5 MG tablet Commonly known as: ZAROXOLYN TAKE 1 TABLET 1 HOUR PRIOR TO TAKING LASIX DAILY   montelukast  10 MG tablet Commonly known as: Singulair Take 1 tablet (10 mg total) by mouth at bedtime.   multivitamin with minerals Tabs tablet Take 1 tablet by mouth daily.   potassium chloride SA 20 MEQ tablet Commonly known as: KLOR-CON Take 20 mEq by mouth daily.   rosuvastatin 20 MG tablet Commonly known as: CRESTOR Take 20 mg by mouth at bedtime.   SYSTANE OP Place 1 drop into both eyes daily as needed (dry eyes).   Vitamin D3 25 MCG (1000 UT) Caps Take 1,000 Units by mouth daily.       Allergies: No Known Allergies  Past Medical History, Surgical history, Social history, and Family History were reviewed and updated.  Review of Systems: Review of Systems  Constitutional: Negative.   HENT: Negative.   Eyes: Negative.   Respiratory: Negative.     Cardiovascular: Negative.   Gastrointestinal: Negative.   Genitourinary: Negative.   Musculoskeletal: Negative.   Skin: Negative.   Neurological: Negative.   Endo/Heme/Allergies: Negative.   Psychiatric/Behavioral: Negative.       Physical Exam:  weight is 218 lb 1.9 oz (98.9 kg). His oral temperature is 97.7 F (36.5 C). His blood pressure is 163/83 (abnormal) and his pulse is 60. His respiration is 20 and oxygen saturation is 100%.   Wt Readings from Last 3 Encounters:  04/03/20 218 lb 1.9 oz (98.9 kg)  03/06/20 219 lb (99.3 kg)  02/07/20 215 lb 0.6 oz (97.5 kg)    Physical Exam Vitals reviewed.  HENT:     Head: Normocephalic and atraumatic.  Eyes:     Pupils: Pupils are equal, round, and reactive to light.  Cardiovascular:     Rate and Rhythm: Normal rate and regular rhythm.     Heart sounds: Normal heart sounds.  Pulmonary:     Effort: Pulmonary effort is normal.     Breath sounds: Normal breath sounds.  Abdominal:     General: Bowel sounds are normal.     Palpations: Abdomen is soft.  Musculoskeletal:        General: No tenderness or deformity. Normal range of motion.     Cervical back: Normal range of motion.  Lymphadenopathy:     Cervical: No cervical adenopathy.  Skin:    General: Skin is warm and dry.     Findings: No erythema or rash.  Neurological:     Mental Status: He is alert and oriented to person, place, and time.  Psychiatric:        Behavior: Behavior normal.        Thought Content: Thought content normal.        Judgment: Judgment normal.      Lab Results  Component Value Date   WBC 5.6 04/03/2020   HGB 11.0 (L) 04/03/2020   HCT 32.4 (L) 04/03/2020   MCV 96.4 04/03/2020   PLT 189 04/03/2020   Lab Results  Component Value Date   FERRITIN 1,061 (H) 06/28/2019   IRON 82 06/28/2019   TIBC 281 06/28/2019   UIBC 200 06/28/2019   IRONPCTSAT 29 06/28/2019   Lab Results  Component Value Date   RETICCTPCT 2.2 04/05/2019   RBC 3.36  (L) 04/03/2020   Lab Results  Component Value Date   KPAFRELGTCHN 11.4 03/06/2020   LAMBDASER 4.9 (L) 03/06/2020   KAPLAMBRATIO 2.33 (H) 03/06/2020   Lab Results  Component Value Date   IGGSERUM 518 (L) 03/06/2020   IGA 20 (L) 03/06/2020   IGMSERUM 27 03/06/2020   Lab  Results  Component Value Date   TOTALPROTELP 5.7 (L) 03/06/2020   ALBUMINELP 3.6 03/06/2020   A1GS 0.2 03/06/2020   A2GS 0.7 03/06/2020   BETS 0.9 03/06/2020   BETA2SER 0.3 01/03/2015   GAMS 0.4 03/06/2020   MSPIKE Not Observed 03/06/2020   SPEI Comment 03/06/2020     Chemistry      Component Value Date/Time   NA 136 04/03/2020 0935   NA 137 04/08/2017 1141   NA 138 10/29/2015 1029   K 3.6 04/03/2020 0935   K 3.4 04/08/2017 1141   K 3.9 10/29/2015 1029   CL 100 04/03/2020 0935   CL 102 04/08/2017 1141   CO2 29 04/03/2020 0935   CO2 30 04/08/2017 1141   CO2 27 10/29/2015 1029   BUN 24 (H) 04/03/2020 0935   BUN 16 04/08/2017 1141   BUN 13.8 10/29/2015 1029   CREATININE 1.18 04/03/2020 0935   CREATININE 1.3 (H) 04/08/2017 1141   CREATININE 1.1 10/29/2015 1029      Component Value Date/Time   CALCIUM 10.0 04/03/2020 0935   CALCIUM 9.3 04/08/2017 1141   CALCIUM 9.3 10/29/2015 1029   ALKPHOS 42 04/03/2020 0935   ALKPHOS 66 04/08/2017 1141   ALKPHOS 52 10/29/2015 1029   AST 15 04/03/2020 0935   AST 20 10/29/2015 1029   ALT 11 04/03/2020 0935   ALT 21 04/08/2017 1141   ALT 14 10/29/2015 1029   BILITOT 0.9 04/03/2020 0935   BILITOT 0.75 10/29/2015 1029       Impression and Plan: Mr. Garry is a very pleasant 86 yo African American gentleman with relapsed IgG kappa myeloma. He has history of stem cell transplant in 2006.   He has tolerated Daratumumab well and has had a nice response.  Hopefully, he will maintain his response to daratumumab.  I forgot to mention that his wife is having some health issues.  We will certainly pray for her.  We will get him back in 1 more month.  Again, we will  hold the Zometa until March.     Volanda Napoleon, MD 11/10/202111:55 AM

## 2020-04-03 NOTE — Telephone Encounter (Signed)
Called pt and left a vm with 04/30/20 appts per 11/10 los... AOM

## 2020-04-04 LAB — KAPPA/LAMBDA LIGHT CHAINS
Kappa free light chain: 9.8 mg/L (ref 3.3–19.4)
Kappa, lambda light chain ratio: 2.45 — ABNORMAL HIGH (ref 0.26–1.65)
Lambda free light chains: 4 mg/L — ABNORMAL LOW (ref 5.7–26.3)

## 2020-04-04 LAB — IGG, IGA, IGM
IgA: 17 mg/dL — ABNORMAL LOW (ref 61–437)
IgG (Immunoglobin G), Serum: 499 mg/dL — ABNORMAL LOW (ref 603–1613)
IgM (Immunoglobulin M), Srm: 27 mg/dL (ref 15–143)

## 2020-04-05 LAB — PROTEIN ELECTROPHORESIS, SERUM, WITH REFLEX
A/G Ratio: 1.5 (ref 0.7–1.7)
Albumin ELP: 3.4 g/dL (ref 2.9–4.4)
Alpha-1-Globulin: 0.2 g/dL (ref 0.0–0.4)
Alpha-2-Globulin: 0.8 g/dL (ref 0.4–1.0)
Beta Globulin: 0.9 g/dL (ref 0.7–1.3)
Gamma Globulin: 0.4 g/dL (ref 0.4–1.8)
Globulin, Total: 2.2 g/dL (ref 2.2–3.9)
Total Protein ELP: 5.6 g/dL — ABNORMAL LOW (ref 6.0–8.5)

## 2020-04-15 ENCOUNTER — Ambulatory Visit: Payer: Medicare Other | Admitting: Cardiology

## 2020-04-23 ENCOUNTER — Encounter (HOSPITAL_COMMUNITY): Payer: Medicare Other | Admitting: Dentistry

## 2020-04-30 ENCOUNTER — Inpatient Hospital Stay: Payer: Medicare Other

## 2020-04-30 ENCOUNTER — Inpatient Hospital Stay: Payer: Medicare Other | Attending: Hematology & Oncology

## 2020-04-30 ENCOUNTER — Inpatient Hospital Stay (HOSPITAL_BASED_OUTPATIENT_CLINIC_OR_DEPARTMENT_OTHER): Payer: Medicare Other | Admitting: Hematology & Oncology

## 2020-04-30 ENCOUNTER — Telehealth: Payer: Self-pay | Admitting: Hematology & Oncology

## 2020-04-30 ENCOUNTER — Encounter: Payer: Self-pay | Admitting: Hematology & Oncology

## 2020-04-30 ENCOUNTER — Other Ambulatory Visit: Payer: Self-pay

## 2020-04-30 VITALS — BP 153/77 | HR 56 | Temp 97.4°F | Resp 18

## 2020-04-30 VITALS — BP 155/80 | HR 65 | Temp 97.6°F | Resp 18 | Wt 217.8 lb

## 2020-04-30 DIAGNOSIS — Z79899 Other long term (current) drug therapy: Secondary | ICD-10-CM | POA: Insufficient documentation

## 2020-04-30 DIAGNOSIS — C9000 Multiple myeloma not having achieved remission: Secondary | ICD-10-CM

## 2020-04-30 DIAGNOSIS — D630 Anemia in neoplastic disease: Secondary | ICD-10-CM | POA: Insufficient documentation

## 2020-04-30 DIAGNOSIS — Z5112 Encounter for antineoplastic immunotherapy: Secondary | ICD-10-CM | POA: Diagnosis not present

## 2020-04-30 DIAGNOSIS — C9002 Multiple myeloma in relapse: Secondary | ICD-10-CM | POA: Diagnosis not present

## 2020-04-30 DIAGNOSIS — Z95828 Presence of other vascular implants and grafts: Secondary | ICD-10-CM

## 2020-04-30 LAB — CMP (CANCER CENTER ONLY)
ALT: 11 U/L (ref 0–44)
AST: 15 U/L (ref 15–41)
Albumin: 4 g/dL (ref 3.5–5.0)
Alkaline Phosphatase: 46 U/L (ref 38–126)
Anion gap: 6 (ref 5–15)
BUN: 25 mg/dL — ABNORMAL HIGH (ref 8–23)
CO2: 29 mmol/L (ref 22–32)
Calcium: 10 mg/dL (ref 8.9–10.3)
Chloride: 102 mmol/L (ref 98–111)
Creatinine: 1.31 mg/dL — ABNORMAL HIGH (ref 0.61–1.24)
GFR, Estimated: 54 mL/min — ABNORMAL LOW (ref >60)
Glucose, Bld: 103 mg/dL — ABNORMAL HIGH (ref 70–99)
Potassium: 3.7 mmol/L (ref 3.5–5.1)
Sodium: 137 mmol/L (ref 135–145)
Total Bilirubin: 0.6 mg/dL (ref 0.3–1.2)
Total Protein: 5.9 g/dL — ABNORMAL LOW (ref 6.5–8.1)

## 2020-04-30 LAB — CBC WITH DIFFERENTIAL (CANCER CENTER ONLY)
Abs Immature Granulocytes: 0.02 10*3/uL (ref 0.00–0.07)
Basophils Absolute: 0 10*3/uL (ref 0.0–0.1)
Basophils Relative: 0 %
Eosinophils Absolute: 0.1 10*3/uL (ref 0.0–0.5)
Eosinophils Relative: 2 %
HCT: 34 % — ABNORMAL LOW (ref 39.0–52.0)
Hemoglobin: 11.3 g/dL — ABNORMAL LOW (ref 13.0–17.0)
Immature Granulocytes: 0 %
Lymphocytes Relative: 30 %
Lymphs Abs: 1.5 10*3/uL (ref 0.7–4.0)
MCH: 32.5 pg (ref 26.0–34.0)
MCHC: 33.2 g/dL (ref 30.0–36.0)
MCV: 97.7 fL (ref 80.0–100.0)
Monocytes Absolute: 0.6 10*3/uL (ref 0.1–1.0)
Monocytes Relative: 12 %
Neutro Abs: 2.8 10*3/uL (ref 1.7–7.7)
Neutrophils Relative %: 56 %
Platelet Count: 190 10*3/uL (ref 150–400)
RBC: 3.48 MIL/uL — ABNORMAL LOW (ref 4.22–5.81)
RDW: 13.2 % (ref 11.5–15.5)
WBC Count: 5.1 10*3/uL (ref 4.0–10.5)
nRBC: 0 % (ref 0.0–0.2)

## 2020-04-30 LAB — LACTATE DEHYDROGENASE: LDH: 171 U/L (ref 98–192)

## 2020-04-30 MED ORDER — DEXAMETHASONE 4 MG PO TABS
ORAL_TABLET | ORAL | Status: AC
  Filled 2020-04-30: qty 5

## 2020-04-30 MED ORDER — ACETAMINOPHEN 325 MG PO TABS
ORAL_TABLET | ORAL | Status: AC
  Filled 2020-04-30: qty 2

## 2020-04-30 MED ORDER — DIPHENHYDRAMINE HCL 25 MG PO CAPS
ORAL_CAPSULE | ORAL | Status: AC
  Filled 2020-04-30: qty 2

## 2020-04-30 MED ORDER — DEXAMETHASONE 4 MG PO TABS
20.0000 mg | ORAL_TABLET | Freq: Once | ORAL | Status: AC
  Administered 2020-04-30: 20 mg via ORAL

## 2020-04-30 MED ORDER — HEPARIN SOD (PORK) LOCK FLUSH 100 UNIT/ML IV SOLN
500.0000 [IU] | Freq: Once | INTRAVENOUS | Status: AC | PRN
  Administered 2020-04-30: 500 [IU]
  Filled 2020-04-30: qty 5

## 2020-04-30 MED ORDER — ACETAMINOPHEN 325 MG PO TABS
650.0000 mg | ORAL_TABLET | Freq: Once | ORAL | Status: AC
  Administered 2020-04-30: 650 mg via ORAL

## 2020-04-30 MED ORDER — SODIUM CHLORIDE 0.9 % IV SOLN
Freq: Once | INTRAVENOUS | Status: DC
  Filled 2020-04-30: qty 250

## 2020-04-30 MED ORDER — DARATUMUMAB-HYALURONIDASE-FIHJ 1800-30000 MG-UT/15ML ~~LOC~~ SOLN
1800.0000 mg | Freq: Once | SUBCUTANEOUS | Status: AC
  Administered 2020-04-30: 1800 mg via SUBCUTANEOUS
  Filled 2020-04-30: qty 15

## 2020-04-30 MED ORDER — DIPHENHYDRAMINE HCL 25 MG PO CAPS
50.0000 mg | ORAL_CAPSULE | Freq: Once | ORAL | Status: AC
  Administered 2020-04-30: 50 mg via ORAL

## 2020-04-30 MED ORDER — SODIUM CHLORIDE 0.9% FLUSH
10.0000 mL | INTRAVENOUS | Status: DC | PRN
  Administered 2020-04-30: 10 mL
  Filled 2020-04-30: qty 10

## 2020-04-30 MED ORDER — SODIUM CHLORIDE 0.9% FLUSH
10.0000 mL | Freq: Once | INTRAVENOUS | Status: AC
  Administered 2020-04-30: 10 mL via INTRAVENOUS
  Filled 2020-04-30: qty 10

## 2020-04-30 NOTE — Telephone Encounter (Signed)
Appointments scheduled calendar printed per 12/7 los

## 2020-04-30 NOTE — Patient Instructions (Signed)

## 2020-04-30 NOTE — Patient Instructions (Signed)
Daratumumab injection What is this medicine? DARATUMUMAB (dar a toom ue mab) is a monoclonal antibody. It is used to treat multiple myeloma. This medicine may be used for other purposes; ask your health care provider or pharmacist if you have questions. COMMON BRAND NAME(S): DARZALEX What should I tell my health care provider before I take this medicine? They need to know if you have any of these conditions:  infection (especially a virus infection such as chickenpox, herpes, or hepatitis B virus)  lung or breathing disease  an unusual or allergic reaction to daratumumab, other medicines, foods, dyes, or preservatives  pregnant or trying to get pregnant  breast-feeding How should I use this medicine? This medicine is for infusion into a vein. It is given by a health care professional in a hospital or clinic setting. Talk to your pediatrician regarding the use of this medicine in children. Special care may be needed. Overdosage: If you think you have taken too much of this medicine contact a poison control center or emergency room at once. NOTE: This medicine is only for you. Do not share this medicine with others. What if I miss a dose? Keep appointments for follow-up doses as directed. It is important not to miss your dose. Call your doctor or health care professional if you are unable to keep an appointment. What may interact with this medicine? Interactions have not been studied. This list may not describe all possible interactions. Give your health care provider a list of all the medicines, herbs, non-prescription drugs, or dietary supplements you use. Also tell them if you smoke, drink alcohol, or use illegal drugs. Some items may interact with your medicine. What should I watch for while using this medicine? This drug may make you feel generally unwell. Report any side effects. Continue your course of treatment even though you feel ill unless your doctor tells you to stop. This  medicine can cause serious allergic reactions. To reduce your risk you may need to take medicine before treatment with this medicine. Take your medicine as directed. This medicine can affect the results of blood tests to match your blood type. These changes can last for up to 6 months after the final dose. Your healthcare provider will do blood tests to match your blood type before you start treatment. Tell all of your healthcare providers that you are being treated with this medicine before receiving a blood transfusion. This medicine can affect the results of some tests used to determine treatment response; extra tests may be needed to evaluate response. Do not become pregnant while taking this medicine or for 3 months after stopping it. Women should inform their doctor if they wish to become pregnant or think they might be pregnant. There is a potential for serious side effects to an unborn child. Talk to your health care professional or pharmacist for more information. What side effects may I notice from receiving this medicine? Side effects that you should report to your doctor or health care professional as soon as possible:  allergic reactions like skin rash, itching or hives, swelling of the face, lips, or tongue  breathing problems  chills  cough  dizziness  feeling faint or lightheaded  headache  low blood counts - this medicine may decrease the number of white blood cells, red blood cells and platelets. You may be at increased risk for infections and bleeding.  nausea, vomiting  shortness of breath  signs of decreased platelets or bleeding - bruising, pinpoint red spots on  the skin, black, tarry stools, blood in the urine  signs of decreased red blood cells - unusually weak or tired, feeling faint or lightheaded, falls  signs of infection - fever or chills, cough, sore throat, pain or difficulty passing urine  signs and symptoms of liver injury like dark yellow or brown  urine; general ill feeling or flu-like symptoms; light-colored stools; loss of appetite; right upper belly pain; unusually weak or tired; yellowing of the eyes or skin Side effects that usually do not require medical attention (report to your doctor or health care professional if they continue or are bothersome):  back pain  constipation  diarrhea  joint pain  muscle cramps  pain, tingling, numbness in the hands or feet  swelling of the ankles, feet, hands  tiredness  trouble sleeping This list may not describe all possible side effects. Call your doctor for medical advice about side effects. You may report side effects to FDA at 1-800-FDA-1088. Where should I keep my medicine? This drug is given in a hospital or clinic and will not be stored at home. NOTE: This sheet is a summary. It may not cover all possible information. If you have questions about this medicine, talk to your doctor, pharmacist, or health care provider.  2020 Elsevier/Gold Standard (2019-01-17 18:10:54)  

## 2020-04-30 NOTE — Progress Notes (Signed)
Hematology and Oncology Follow Up Visit  Robert [name] [id number] [birth year] 86 y.o. 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 Aranesp 300 mcg subcu q. 3 weeks for hemoglobin less than 10   Interim History:  Robert [name] is here today for follow-up and treatment.  He really looks great.  He had a wonderful Thanksgiving.  He and his wife were together.  He is still dealing with the root canal.  He needs to have a crown put on.  I told him that he could have the crown put on tomorrow.  He will need antibiotics for this.  His myeloma has not been a problem.  There has been no monoclonal spike in his blood.  His IgG level back in November was 500 mg/dL.  The kappa light chain was 1 mg/dL.  He has had no issues with nausea or vomiting.  He has had no problems with cough.  Has had no fever.  He has had full vaccinations for the COVID.  There is been no change in bowel or bladder habits.  He has had no bleeding.  He has had no leg swelling.  There are no headaches.  Overall, his performance status is ECOG 1.    Medications:  Allergies as of 04/30/2020   No Known Allergies     Medication List       Accurate as of April 30, 2020  9:49 AM. If you have any questions, ask your nurse or doctor.        amoxicillin 500 MG tablet Commonly known as: AMOXIL Take 2,000 mg by mouth once. Before dental procedures.   amoxicillin 500 MG capsule Commonly known as: AMOXIL Take 500 mg by mouth 3 (three) times daily.   aspirin 81 MG tablet Take 81 mg by mouth  daily.   bimatoprost 0.01 % Soln Commonly known as: LUMIGAN Place 1 drop into both eyes at bedtime.   CALTRATE 600+D PLUS PO Take 1 tablet by mouth daily.   carvedilol 12.5 MG tablet Commonly known as: COREG Take 1 tablet (12.5 mg total) by mouth 2 (two) times daily with a meal.   ciclopirox 0.77 % cream Commonly known as: LOPROX APPLY CREAM TOPICALLY ONCE DAILY   cloNIDine 0.1 MG tablet Commonly known as: CATAPRES TAKE 1 TABLET DAILY. TAKE AN EXTRA TABLET AS NEEDED FOR BLOOD PRESSURE GREATER THAN 170/100 (ONCE PER DAY)   [employer]S VITAMIN B12 2000 MCG tablet Generic drug: cyanocobalamin Take 2,000 mcg by mouth at bedtime.   famciclovir 250 MG tablet Commonly known as: FAMVIR TAKE 1 TABLET DAILY   furosemide 20 MG tablet Commonly known as: LASIX Take 2 tablets (40 mg total) by mouth daily.   HCA LAX-X PO Take by mouth every three (3) days as needed.   losartan 100 MG tablet Commonly known as: COZAAR Take 100 mg by mouth daily.   metolazone 5 MG tablet Commonly known as: ZAROXOLYN TAKE 1 TABLET 1 HOUR PRIOR TO TAKING LASIX DAILY   montelukast 10 MG tablet Commonly known as: Singulair Take  1 tablet (10 mg total) by mouth at bedtime.   multivitamin with minerals Tabs tablet Take 1 tablet by mouth daily.   potassium chloride SA 20 MEQ tablet Commonly known as: KLOR-CON Take 20 mEq by mouth daily.   rosuvastatin 20 MG tablet Commonly known as: CRESTOR Take 20 mg by mouth at bedtime.   SYSTANE OP Place 1 drop into both eyes daily as needed (dry eyes).   Vitamin D3 25 MCG (1000 UT) Caps Take 1,000 Units by mouth daily.       Allergies: No Known Allergies  Past Medical History, Surgical history, Social history, and Family History were reviewed and updated.  Review of Systems: Review of Systems  Constitutional: Negative.   HENT: Negative.   Eyes: Negative.   Respiratory: Negative.   Cardiovascular: Negative.   Gastrointestinal: Negative.    Genitourinary: Negative.   Musculoskeletal: Negative.   Skin: Negative.   Neurological: Negative.   Endo/Heme/Allergies: Negative.   Psychiatric/Behavioral: Negative.       Physical Exam:  weight is 217 lb 12 oz (98.8 kg). His oral temperature is 97.6 F (36.4 C). His blood pressure is 155/80 (abnormal) and his pulse is 65. His respiration is 18 and oxygen saturation is 100%.   Wt Readings from Last 3 Encounters:  04/30/20 217 lb 12 oz (98.8 kg)  04/03/20 218 lb 1.9 oz (98.9 kg)  03/06/20 219 lb (99.3 kg)    Physical Exam Vitals reviewed.  HENT:     Head: Normocephalic and atraumatic.  Eyes:     Pupils: Pupils are equal, round, and reactive to light.  Cardiovascular:     Rate and Rhythm: Normal rate and regular rhythm.     Heart sounds: Normal heart sounds.  Pulmonary:     Effort: Pulmonary effort is normal.     Breath sounds: Normal breath sounds.  Abdominal:     General: Bowel sounds are normal.     Palpations: Abdomen is soft.  Musculoskeletal:        General: No tenderness or deformity. Normal range of motion.     Cervical back: Normal range of motion.  Lymphadenopathy:     Cervical: No cervical adenopathy.  Skin:    General: Skin is warm and dry.     Findings: No erythema or rash.  Neurological:     Mental Status: He is alert and oriented to person, place, and time.  Psychiatric:        Behavior: Behavior normal.        Thought Content: Thought content normal.        Judgment: Judgment normal.      Lab Results  Component Value Date   WBC 5.1 04/30/2020   HGB 11.3 (L) 04/30/2020   HCT 34.0 (L) 04/30/2020   MCV 97.7 04/30/2020   PLT 190 04/30/2020   Lab Results  Component Value Date   FERRITIN 1,061 (H) 06/28/2019   IRON 82 06/28/2019   TIBC 281 06/28/2019   UIBC 200 06/28/2019   IRONPCTSAT 29 06/28/2019   Lab Results  Component Value Date   RETICCTPCT 2.2 04/05/2019   RBC 3.48 (L) 04/30/2020   Lab Results  Component Value Date    KPAFRELGTCHN 9.8 04/03/2020   LAMBDASER 4.0 (L) 04/03/2020   KAPLAMBRATIO 2.45 (H) 04/03/2020   Lab Results  Component Value Date   IGGSERUM 499 (L) 04/03/2020   IGA 17 (L) 04/03/2020   IGMSERUM 27 04/03/2020   Lab Results  Component Value Date   TOTALPROTELP 5.6 (  L) 04/03/2020   ALBUMINELP 3.4 04/03/2020   A1GS 0.2 04/03/2020   A2GS 0.8 04/03/2020   BETS 0.9 04/03/2020   BETA2SER 0.3 01/03/2015   GAMS 0.4 04/03/2020   MSPIKE Not Observed 04/03/2020   SPEI Comment 03/06/2020     Chemistry      Component Value Date/Time   NA 137 04/30/2020 0805   NA 137 04/08/2017 1141   NA 138 10/29/2015 1029   K 3.7 04/30/2020 0805   K 3.4 04/08/2017 1141   K 3.9 10/29/2015 1029   CL 102 04/30/2020 0805   CL 102 04/08/2017 1141   CO2 29 04/30/2020 0805   CO2 30 04/08/2017 1141   CO2 27 10/29/2015 1029   BUN 25 (H) 04/30/2020 0805   BUN 16 04/08/2017 1141   BUN 13.8 10/29/2015 1029   CREATININE 1.31 (H) 04/30/2020 0805   CREATININE 1.3 (H) 04/08/2017 1141   CREATININE 1.1 10/29/2015 1029      Component Value Date/Time   CALCIUM 10.0 04/30/2020 0805   CALCIUM 9.3 04/08/2017 1141   CALCIUM 9.3 10/29/2015 1029   ALKPHOS 46 04/30/2020 0805   ALKPHOS 66 04/08/2017 1141   ALKPHOS 52 10/29/2015 1029   AST 15 04/30/2020 0805   AST 20 10/29/2015 1029   ALT 11 04/30/2020 0805   ALT 21 04/08/2017 1141   ALT 14 10/29/2015 1029   BILITOT 0.6 04/30/2020 0805   BILITOT 0.75 10/29/2015 1029       Impression and Plan: Mr. Weida is a very pleasant 86 yo African American gentleman with relapsed IgG kappa myeloma. He has history of stem cell transplant in 2006.   He has tolerated Daratumumab well and has had a nice response.  He still is in remission.  I am just happy that his quality life is doing well.  His blood pressure is under better control.  He is more active.  Again, I see no problems with him having dental work done tomorrow.  If crown needs be put on, this should not be an issue.    We will get him back in 1 more month.  Again, we will hold the Zometa until March.     Volanda Napoleon, MD 12/7/20219:49 AM

## 2020-04-30 NOTE — Progress Notes (Signed)
Pt. refused to wait one hour post injection. Stable and asymptomatic upon release.

## 2020-05-01 ENCOUNTER — Ambulatory Visit (HOSPITAL_COMMUNITY): Payer: Dental | Admitting: Dentistry

## 2020-05-01 ENCOUNTER — Other Ambulatory Visit: Payer: Self-pay | Admitting: *Deleted

## 2020-05-01 DIAGNOSIS — K06010 Localized gingival recession, unspecified: Secondary | ICD-10-CM | POA: Diagnosis not present

## 2020-05-01 DIAGNOSIS — K0850 Unsatisfactory restoration of tooth, unspecified: Secondary | ICD-10-CM | POA: Diagnosis not present

## 2020-05-01 LAB — IGG, IGA, IGM
IgA: 16 mg/dL — ABNORMAL LOW (ref 61–437)
IgG (Immunoglobin G), Serum: 480 mg/dL — ABNORMAL LOW (ref 603–1613)
IgM (Immunoglobulin M), Srm: 31 mg/dL (ref 15–143)

## 2020-05-01 LAB — KAPPA/LAMBDA LIGHT CHAINS
Kappa free light chain: 11.2 mg/L (ref 3.3–19.4)
Kappa, lambda light chain ratio: 2.29 — ABNORMAL HIGH (ref 0.26–1.65)
Lambda free light chains: 4.9 mg/L — ABNORMAL LOW (ref 5.7–26.3)

## 2020-05-01 LAB — PROTEIN ELECTROPHORESIS, SERUM, WITH REFLEX
A/G Ratio: 1.5 (ref 0.7–1.7)
Albumin ELP: 3.4 g/dL (ref 2.9–4.4)
Alpha-1-Globulin: 0.2 g/dL (ref 0.0–0.4)
Alpha-2-Globulin: 0.9 g/dL (ref 0.4–1.0)
Beta Globulin: 0.8 g/dL (ref 0.7–1.3)
Gamma Globulin: 0.5 g/dL (ref 0.4–1.8)
Globulin, Total: 2.3 g/dL (ref 2.2–3.9)
Total Protein ELP: 5.7 g/dL — ABNORMAL LOW (ref 6.0–8.5)

## 2020-05-01 MED ORDER — AMOXICILLIN 500 MG PO TABS: 2000.0000 mg | ORAL_TABLET | Freq: Once | ORAL | 0 refills | Status: AC

## 2020-05-01 NOTE — Progress Notes (Addendum)
DENTAL VISIT FOLLOW-UP APPOINTMENT  Service Date:   05/01/2020  Patient Name:   Robert [name] Date of Birth:   May 18, 1934 Medical Record Number: [id number]    COVID 19 SCREENING: The patient denies symptoms concerning for COVID-19 infection including fever, chills, cough, or newly developed shortness of breath.  HPI: Robert [name] is presents today for a dental visit.  He originally was scheduled to have a crown preparation completed on tooth #13, however since this appointment was scheduled, his PFM crown on #19 had come off and he had several questions and concerns regarding his treatment options and plan from here.  Medical and dental history reviewed with the patient.  He did have a root canal completed on tooth #13 and it was restorable per Endodontist Dr. Carmine Savoy note.  Robert [name] also saw his medical oncologist, Dr. Marin [name], earlier this week and per pt, he will not have another Zometa infusion until February of 2022.   Dental History: Root canal therapy completed on tooth #13 on 03/27/2020.  Since then, the PFM crown on tooth #19 has fallen off twice.  Pt reports going to his regular dentist at [place] the first time it fell off and they re-cemented it, but it came off again 5 days later. He does have the crown with him today and it is in-tact.  He currently denies any dental/oral pain or sensitivity.  Patient able to manage oral secretions.  Patient denies dysphagia, odynophagia, dysphonia, SOB and neck pain.  Patient denies fever, rigors and malaise.   CHIEF COMPLAINT: "I wasn't sure what you could do about this crown that came off," points to #19.     Patient Active Problem List   Diagnosis Date Noted  . Glaucoma of both eyes 10/10/2019  . Swelling of both lower extremities 10/18/2018  . Iron deficiency anemia 10/07/2018  . 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 Anemia of chronic renal failure, stage 3 (moderate) ([place]) 10/06/2018  . Erythropoietin deficiency anemia  10/06/2018  . Goals of care, counseling/discussion 05/12/2018  . Syncope 12/24/2017  . Groin lump 12/28/2016  . Lytic bone lesions on xray 03/17/2016  . Cerumen debris on tympanic membrane 11/28/2015  . White coat syndrome with diagnosis of hypertension 11/28/2015  . Dyslipidemia 04/08/2015  . Myeloma ([place]) 07/02/2011  . ED (erectile dysfunction) 06/02/2010  . Benign essential hypertension 03/19/2010  . Multiple myeloma in remission ([place]) 03/19/2010  . B12 deficiency 02/17/2010  . S/P autologous bone marrow transplantation ([place]) 04/29/2005  . IgG multiple myeloma ([place]) 07/23/2004   Past Medical History:  Diagnosis Date  . Anemia of chronic renal failure, stage 3 (moderate) ([place]) 10/06/2018  . Arthritis   . B12 deficiency 02/17/2010  . Benign essential hypertension 03/19/2010  . Cerumen debris on tympanic membrane 11/28/2015  . Dyslipidemia 04/08/2015  . ED (erectile dysfunction) 06/02/2010  . 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 Glaucoma of both eyes 10/10/2019  . Goals of care, counseling/discussion 05/12/2018  . Groin lump 12/28/2016  . Hyperlipidemia   . Hypertension   . IgG multiple myeloma ([place]) 07/23/2004  . Iron deficiency anemia 10/07/2018  . 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 Lytic bone lesions on xray 03/17/2016  . Multiple myeloma ([place])    2006  . Multiple myeloma in remission ([place]) 03/19/2010  . Myeloma ([place]) 07/02/2011  . S/P autologous bone marrow transplantation ([place]) 04/29/2005  . Swelling of both lower extremities 10/18/2018  . Syncope 12/24/2017  . White coat syndrome with diagnosis of hypertension 11/28/2015  Past Surgical History:  Procedure Laterality Date  . CATARACT EXTRACTION BILATERAL W/ ANTERIOR VITRECTOMY    . IR IMAGING GUIDED PORT INSERTION  05/30/2018  . LIMBAL STEM CELL TRANSPLANT     No Known Allergies Current Outpatient Medications  Medication Sig Dispense Refill  . amoxicillin (AMOXIL) 500 MG capsule Take 500 mg by mouth 3 (three) times daily.  (Patient not taking: Reported on 04/30/2020)    . amoxicillin (AMOXIL) 500 MG tablet Take 4 tablets (2,000 mg total) by mouth once for 1 dose. Before dental procedures. 4 tablet 0  . aspirin 81 MG tablet Take 81 mg by mouth daily.    . bimatoprost (LUMIGAN) 0.01 % SOLN Place 1 drop into both eyes at bedtime.     . Calcium Carbonate-Vit D-Min (CALTRATE 600+D PLUS PO) Take 1 tablet by mouth daily.    . carvedilol (COREG) 12.5 MG tablet Take 1 tablet (12.5 mg total) by mouth 2 (two) times daily with a meal. 90 tablet 1  . Cholecalciferol (VITAMIN D3) 1000 UNITS CAPS Take 1,000 Units by mouth daily.     . ciclopirox (LOPROX) 0.77 % cream APPLY CREAM TOPICALLY ONCE DAILY    . cloNIDine (CATAPRES) 0.1 MG tablet TAKE 1 TABLET DAILY. TAKE AN EXTRA TABLET AS NEEDED FOR BLOOD PRESSURE GREATER THAN 170/100 (ONCE PER DAY) (Patient not taking: Reported on 04/03/2020) 90 tablet 7  . cyanocobalamin ([employer] VITAMIN B12) 2000 MCG tablet Take 2,000 mcg by mouth at bedtime.     . famciclovir (FAMVIR) 250 MG tablet TAKE 1 TABLET DAILY 90 tablet 3  . furosemide (LASIX) 20 MG tablet Take 2 tablets (40 mg total) by mouth daily. (Patient not taking: Reported on 04/03/2020) 180 tablet 1  . losartan (COZAAR) 100 MG tablet Take 100 mg by mouth daily.    . metolazone (ZAROXOLYN) 5 MG tablet TAKE 1 TABLET 1 HOUR PRIOR TO TAKING LASIX DAILY 180 tablet 3  . montelukast (SINGULAIR) 10 MG tablet Take 1 tablet (10 mg total) by mouth at bedtime. 30 tablet 5  . Multiple Vitamin (MULITIVITAMIN WITH MINERALS) TABS Take 1 tablet by mouth daily.    Vladimir Faster Glycol-Propyl Glycol (SYSTANE OP) Place 1 drop into both eyes daily as needed (dry eyes).     . potassium chloride SA (K-DUR,KLOR-CON) 20 MEQ tablet Take 20 mEq by mouth daily.    . rosuvastatin (CRESTOR) 20 MG tablet Take 20 mg by mouth at bedtime.    . Sennosides (HCA LAX-X PO) Take by mouth every three (3) days as needed.     No current facility-administered medications for this  visit.   Facility-Administered Medications Ordered in Other Visits  Medication Dose Route Frequency Provider Last Rate Last Admin  . sodium chloride flush (NS) 0.9 % injection 10 mL  10 mL Intravenous PRN Volanda Napoleon, MD   10 mL at 07/28/18 1313  . sodium chloride flush (NS) 0.9 % injection 10 mL  10 mL Intravenous PRN Volanda Napoleon, MD   10 mL at 09/01/18 0931  . sodium chloride flush (NS) 0.9 % injection 10 mL  10 mL Intravenous PRN Volanda Napoleon, MD   10 mL at 09/08/18 1009    LABS: Lab Results  Component Value Date   WBC 5.1 04/30/2020   HGB 11.3 (L) 04/30/2020   HCT 34.0 (L) 04/30/2020   MCV 97.7 04/30/2020   PLT 190 04/30/2020      Component Value Date/Time   NA 137 04/30/2020 0805   NA  137 04/08/2017 1141   NA 138 10/29/2015 1029   K 3.7 04/30/2020 0805   K 3.4 04/08/2017 1141   K 3.9 10/29/2015 1029   CL 102 04/30/2020 0805   CL 102 04/08/2017 1141   CO2 29 04/30/2020 0805   CO2 30 04/08/2017 1141   CO2 27 10/29/2015 1029   GLUCOSE 103 (H) 04/30/2020 0805   GLUCOSE 100 04/08/2017 1141   BUN 25 (H) 04/30/2020 0805   BUN 16 04/08/2017 1141   BUN 13.8 10/29/2015 1029   CREATININE 1.31 (H) 04/30/2020 0805   CREATININE 1.3 (H) 04/08/2017 1141   CREATININE 1.1 10/29/2015 1029   CALCIUM 10.0 04/30/2020 0805   CALCIUM 9.3 04/08/2017 1141   CALCIUM 9.3 10/29/2015 1029   GFRNONAA 54 (L) 04/30/2020 0805   GFRAA >60 02/07/2020 1030   Lab Results  Component Value Date   INR 1.1 09/15/2018   INR 0.93 05/30/2018   INR 1.03 12/24/2017   No results found for: PTT  Social History   Socioeconomic History  . Marital status: Married    Spouse name: Not on file  . Number of children: Not on file  . Years of education: Not on file  . Highest education level: Not on file  Occupational History  . Not on file  Tobacco Use  . Smoking status: Former Smoker    Packs/day: 4.00    Years: 9.00    Pack years: 36.00    Types: Cigars, Cigarettes    Start date:  07/08/1979    Quit date: 07/07/1988    Years since quitting: 31.8  . Smokeless tobacco: Never Used  . Tobacco comment: quit 24 years ago  Vaping Use  . Vaping Use: Never used  Substance and Sexual Activity  . Alcohol use: Yes    Alcohol/week: 6.0 standard drinks    Types: 6 Glasses of wine per week    Comment: very seldom  . Drug use: No  . Sexual activity: Not on file  Other Topics Concern  . Not on file  Social History Narrative  . Not on file   Social Determinants of Health   Financial Resource Strain:   . Difficulty of Paying Living Expenses: Not on file  Food Insecurity:   . Worried About Charity fundraiser in the Last Year: Not on file  . Ran Out of Food in the Last Year: Not on file  Transportation Needs:   . Lack of Transportation (Medical): Not on file  . Lack of Transportation (Non-Medical): Not on file  Physical Activity:   . Days of Exercise per Week: Not on file  . Minutes of Exercise per Session: Not on file  Stress:   . Feeling of Stress : Not on file  Social Connections:   . Frequency of Communication with Friends and Family: Not on file  . Frequency of Social Gatherings with Friends and Family: Not on file  . Attends Religious Services: Not on file  . Active Member of Clubs or Organizations: Not on file  . Attends Archivist Meetings: Not on file  . Marital Status: Not on file  Intimate Partner Violence:   . Fear of Current or Ex-Partner: Not on file  . Emotionally Abused: Not on file  . Physically Abused: Not on file  . Sexually Abused: Not on file   Family History  Problem Relation Age of Onset  . Heart attack Father   . Heart disease Father   . Throat cancer Mother   .  Diabetes Brother   . Colon cancer Neg Hx     REVIEW OF SYSTEMS: Reviewed with the patient as per HPI. PSYCH: Patient denies having dental phobia.   VITAL SIGNS: BP (!) 172/92 (BP Location: Right Arm)   Pulse 83   Temp 97.9 F (36.6 C)   SpO2 100%     PHYSICAL EXAMINATION: GENERAL: Well-developed, comfortable and in no apparent distress. NEUROLOGICAL: Alert and oriented to person, place and time. EXTRAORAL:  Facial symmetry present without any edema or erythema.  No swelling or lymphadenopathy. INTRAORAL: Soft tissues appear well-perfused and mucous membranes moist.  FOM and vestibules soft and not raised. Oral cavity without mass or lesion. No signs of infection, parulis, sinus tract, edema or erythema evident upon exam.  DENTAL EXAMINATION: ENDODONTIC: #13 previously RCT with temporary restoration in place and in-tact. CROWN/BRIDGE: #19 missing full-coverage crown and core-build up.  There are no signs of clinical decay or edema/acute infection. OCCLUSION: #13 is completely out of occlusion.  RADIOGRAPHIC EXAMINATION 2 Periapical radiographs (#13 and #19) and 1 BW exposed and interpreted: Localized mils horizontal bone loss. Missing teeth #15, #16, #17 and #18. #13 and #20 previously RCT. #13 has provisional restoration. #20 has periapical radiolucency at apex, likely healing infection from previous root canal. #19 missing full coverage crown.  Very small area of sound dentin between pulpal chamber and occlusal surface. #19 has some mild horizontal bone loss with furcation involvement and moderate on the distal root. Crown-root-ratio is about 1:3.   ASSESSMENT 1. Multiple Myeloma in remission 2. H/o Zometa and Stem Cell Transplant 3. Tooth #13 previously Endo treated, needs definitive crown placement 4. Tooth #19 normal pulp with normal apical tissues, missing definitive crown 5. Gingival recession 6. Suboptimal dental restoration 7. Risk of osteonecrosis of the jaw   PLAN/RECOMMENDATIONS  . I discussed the risks, benefits, and complications of various treatment options with the patient in relationship to his medical and dental conditions. I told the patient that it is great he was able to have the root canal completed on  tooth #13 and not extraction.  I explained that tooth #19, like #13, needs a full-coverage crown due to the lack of coronal tooth structure sufficient for a simple restoration.  I explained to the patient that looking at his Xrays from today, #19 has not had a root canal in the past, and unfortunately I would be hesitant to just place a new crown on this tooth given the proximity of the pulp to the occlusal surface, as well as the crown to root ratio of a mandibular first molar.  I would personally recommend having a prophylactic root canal completed on tooth #19 with post/core and crown for the best possible outcome/prognosis.  Given his h/o Zometa, I would not want to cover the tooth with a restoration or drill on the tooth for another crown to be made when there is a decent chance it would become symptomatic and infected as his other tooth #13 just did, and if this happens and the tooth needs to be extracted he is at high risk for osteonecrosis of the jaw.  I also recommended to the patient that having both #13 and #19 crowns completed at the same time would be ideal since these teeth are both on the left side and it would be easier to get his occlusion/bite correct. . We discussed various treatment options to include 1. No treatment  2. referral to Endodontics for root canal therapy on tooth #19 and then  return here or his regular dentist for crown fabrication on teeth #13 and #19, or 3. return to his regular dentist at [place] for their recommended treatment plan on how to proceed with teeth #19 and #13.  . The patient verbalized understanding of all options, and would like to return to his regular dentist at [place] to see what their recommended treatment plan would be in this case.    . I told the patient that I will send his regular dentist's office his Radiographs and all relevant treatment notes so that they are up to date on his current medical and dental situations.  Release  of information signed today by pt.   The patient tolerated today's visit well.  All questions/concerns were addressed and the patient departed in stable condition.   I spent in excess of 120 minutes during the conduct of this consultation and >50% of this time involved direct face-to-face encounter for counseling and/or coordination of the patient's care.   Frankston Benson Norway, DMD

## 2020-05-27 ENCOUNTER — Telehealth: Payer: Self-pay | Admitting: *Deleted

## 2020-05-27 NOTE — Telephone Encounter (Signed)
Patient wants to cancel appt for 05/28/2020 d/t icy conditions on road.  Message sent to scheduler to rescheule for later this week. Ok to see Emeline Gins NP per Dr Bea Laura.

## 2020-05-28 ENCOUNTER — Telehealth: Payer: Self-pay

## 2020-05-28 ENCOUNTER — Inpatient Hospital Stay: Payer: Medicare Other

## 2020-05-28 ENCOUNTER — Inpatient Hospital Stay: Payer: Medicare Other | Admitting: Hematology & Oncology

## 2020-05-28 NOTE — Telephone Encounter (Signed)
S/w pt per scheduling message and he is aware of his r/s      aom

## 2020-05-29 ENCOUNTER — Inpatient Hospital Stay: Payer: Medicare Other | Attending: Hematology & Oncology

## 2020-05-29 ENCOUNTER — Inpatient Hospital Stay: Payer: Medicare Other

## 2020-05-29 ENCOUNTER — Telehealth: Payer: Self-pay

## 2020-05-29 ENCOUNTER — Encounter: Payer: Self-pay | Admitting: Family

## 2020-05-29 ENCOUNTER — Telehealth: Payer: Self-pay | Admitting: Family

## 2020-05-29 ENCOUNTER — Other Ambulatory Visit: Payer: Self-pay

## 2020-05-29 ENCOUNTER — Inpatient Hospital Stay (HOSPITAL_BASED_OUTPATIENT_CLINIC_OR_DEPARTMENT_OTHER): Payer: Medicare Other | Admitting: Family

## 2020-05-29 VITALS — BP 173/83 | HR 62 | Temp 98.2°F | Resp 18 | Ht 71.0 in | Wt 218.0 lb

## 2020-05-29 VITALS — BP 162/78 | HR 57 | Resp 18

## 2020-05-29 DIAGNOSIS — Z5112 Encounter for antineoplastic immunotherapy: Secondary | ICD-10-CM | POA: Diagnosis not present

## 2020-05-29 DIAGNOSIS — C9001 Multiple myeloma in remission: Secondary | ICD-10-CM | POA: Diagnosis not present

## 2020-05-29 DIAGNOSIS — C9000 Multiple myeloma not having achieved remission: Secondary | ICD-10-CM | POA: Insufficient documentation

## 2020-05-29 DIAGNOSIS — Z79899 Other long term (current) drug therapy: Secondary | ICD-10-CM | POA: Diagnosis not present

## 2020-05-29 DIAGNOSIS — E785 Hyperlipidemia, unspecified: Secondary | ICD-10-CM | POA: Insufficient documentation

## 2020-05-29 DIAGNOSIS — C9002 Multiple myeloma in relapse: Secondary | ICD-10-CM | POA: Diagnosis present

## 2020-05-29 DIAGNOSIS — I10 Essential (primary) hypertension: Secondary | ICD-10-CM | POA: Insufficient documentation

## 2020-05-29 DIAGNOSIS — M1990 Unspecified osteoarthritis, unspecified site: Secondary | ICD-10-CM | POA: Insufficient documentation

## 2020-05-29 LAB — CBC WITH DIFFERENTIAL (CANCER CENTER ONLY)
Abs Immature Granulocytes: 0.02 10*3/uL (ref 0.00–0.07)
Basophils Absolute: 0 10*3/uL (ref 0.0–0.1)
Basophils Relative: 1 %
Eosinophils Absolute: 0.2 10*3/uL (ref 0.0–0.5)
Eosinophils Relative: 3 %
HCT: 33.3 % — ABNORMAL LOW (ref 39.0–52.0)
Hemoglobin: 11.1 g/dL — ABNORMAL LOW (ref 13.0–17.0)
Immature Granulocytes: 0 %
Lymphocytes Relative: 33 %
Lymphs Abs: 1.9 10*3/uL (ref 0.7–4.0)
MCH: 32.6 pg (ref 26.0–34.0)
MCHC: 33.3 g/dL (ref 30.0–36.0)
MCV: 97.7 fL (ref 80.0–100.0)
Monocytes Absolute: 0.6 10*3/uL (ref 0.1–1.0)
Monocytes Relative: 10 %
Neutro Abs: 3 10*3/uL (ref 1.7–7.7)
Neutrophils Relative %: 53 %
Platelet Count: 176 10*3/uL (ref 150–400)
RBC: 3.41 MIL/uL — ABNORMAL LOW (ref 4.22–5.81)
RDW: 13.2 % (ref 11.5–15.5)
WBC Count: 5.6 10*3/uL (ref 4.0–10.5)
nRBC: 0 % (ref 0.0–0.2)

## 2020-05-29 LAB — CMP (CANCER CENTER ONLY)
ALT: 12 U/L (ref 0–44)
AST: 16 U/L (ref 15–41)
Albumin: 3.9 g/dL (ref 3.5–5.0)
Alkaline Phosphatase: 47 U/L (ref 38–126)
Anion gap: 6 (ref 5–15)
BUN: 20 mg/dL (ref 8–23)
CO2: 30 mmol/L (ref 22–32)
Calcium: 10.1 mg/dL (ref 8.9–10.3)
Chloride: 101 mmol/L (ref 98–111)
Creatinine: 1.15 mg/dL (ref 0.61–1.24)
GFR, Estimated: >60 mL/min (ref >60)
Glucose, Bld: 102 mg/dL — ABNORMAL HIGH (ref 70–99)
Potassium: 3.6 mmol/L (ref 3.5–5.1)
Sodium: 137 mmol/L (ref 135–145)
Total Bilirubin: 0.9 mg/dL (ref 0.3–1.2)
Total Protein: 5.8 g/dL — ABNORMAL LOW (ref 6.5–8.1)

## 2020-05-29 LAB — LACTATE DEHYDROGENASE: LDH: 168 U/L (ref 98–192)

## 2020-05-29 MED ORDER — HEPARIN SOD (PORK) LOCK FLUSH 100 UNIT/ML IV SOLN
500.0000 [IU] | Freq: Once | INTRAVENOUS | Status: AC | PRN
  Administered 2020-05-29: 500 [IU]
  Filled 2020-05-29: qty 5

## 2020-05-29 MED ORDER — SODIUM CHLORIDE 0.9% FLUSH
10.0000 mL | INTRAVENOUS | Status: DC | PRN
  Administered 2020-05-29: 10 mL
  Filled 2020-05-29: qty 10

## 2020-05-29 MED ORDER — DEXAMETHASONE 4 MG PO TABS
20.0000 mg | ORAL_TABLET | Freq: Once | ORAL | Status: AC
  Administered 2020-05-29: 20 mg via ORAL

## 2020-05-29 MED ORDER — ACETAMINOPHEN 325 MG PO TABS
650.0000 mg | ORAL_TABLET | Freq: Once | ORAL | Status: AC
  Administered 2020-05-29: 650 mg via ORAL

## 2020-05-29 MED ORDER — DARATUMUMAB-HYALURONIDASE-FIHJ 1800-30000 MG-UT/15ML ~~LOC~~ SOLN
1800.0000 mg | Freq: Once | SUBCUTANEOUS | Status: AC
Start: 2020-05-29 — End: 2020-05-29
  Administered 2020-05-29: 1800 mg via SUBCUTANEOUS
  Filled 2020-05-29: qty 15

## 2020-05-29 MED ORDER — DIPHENHYDRAMINE HCL 25 MG PO CAPS
ORAL_CAPSULE | ORAL | Status: AC
  Filled 2020-05-29: qty 2

## 2020-05-29 MED ORDER — DEXAMETHASONE 4 MG PO TABS
ORAL_TABLET | ORAL | Status: AC
  Filled 2020-05-29: qty 5

## 2020-05-29 MED ORDER — ACETAMINOPHEN 325 MG PO TABS
ORAL_TABLET | ORAL | Status: AC
  Filled 2020-05-29: qty 2

## 2020-05-29 MED ORDER — DIPHENHYDRAMINE HCL 25 MG PO CAPS
50.0000 mg | ORAL_CAPSULE | Freq: Once | ORAL | Status: AC
  Administered 2020-05-29: 50 mg via ORAL

## 2020-05-29 NOTE — Telephone Encounter (Signed)
Per 1/5 los appointments were already scheduled

## 2020-05-29 NOTE — Progress Notes (Signed)
Hematology and Oncology Follow Up Visit  Robert [name] [id number] March 11, 1934 87 y.o. 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 Aranesp 300 mcg subcu q. 3 weeks for hemoglobin less than 10   Interim History:  Robert [name] is here today for follow-up. He is doing well and has no complaints.  M-spike last month was not observed, IgG level 480 mg/dL and kappa light chains 11.2 mg/L.  No c/o fatigue.  No fever, chills, n/v, cough, rash, dizziness, SOB, chest pain, palpitations, abdominal pain or changes in bowel or bladder habits.  No bleeding, bruising or petechiae.  No swelling, tenderness, numbness or tingling in his extremities.  No falls or syncope.  He has maintained a good appetite and is staying well hydrated. His weight is stable at 218 lbs.   ECOG Performance Status: 1 - Symptomatic but completely ambulatory  Medications:  Allergies as of 05/29/2020   No Known Allergies     Medication List       Accurate as of May 29, 2020  9:13 AM. If you have any questions, ask your nurse or doctor.        amoxicillin 500 MG capsule Commonly known as: AMOXIL Take 500 mg by mouth 3 (three) times daily.   aspirin 81 MG tablet Take 81 mg by mouth daily.   bimatoprost 0.01 % Soln Commonly known as: LUMIGAN Place 1 drop into both eyes at bedtime.   CALTRATE 600+D PLUS PO Take 1 tablet by mouth daily.   carvedilol 12.5 MG tablet Commonly known as: COREG Take 1 tablet (12.5 mg total) by mouth 2 (two) times daily with a meal.    ciclopirox 0.77 % cream Commonly known as: LOPROX APPLY CREAM TOPICALLY ONCE DAILY   cloNIDine 0.1 MG tablet Commonly known as: CATAPRES TAKE 1 TABLET DAILY. TAKE AN EXTRA TABLET AS NEEDED FOR BLOOD PRESSURE GREATER THAN 170/100 (ONCE PER DAY)   cyanocobalamin 2000 MCG tablet Take 2,000 mcg by mouth at bedtime.   famciclovir 250 MG tablet Commonly known as: FAMVIR TAKE 1 TABLET DAILY   furosemide 20 MG tablet Commonly known as: LASIX Take 2 tablets (40 mg total) by mouth daily.   HCA LAX-X PO Take by mouth every three (3) days as needed.   losartan 100 MG tablet Commonly known as: COZAAR Take 100 mg by mouth daily.   metolazone 5 MG tablet Commonly known as: ZAROXOLYN TAKE 1 TABLET 1 HOUR PRIOR TO TAKING LASIX DAILY   montelukast 10 MG tablet Commonly known as: Singulair Take 1 tablet (10 mg total) by mouth at bedtime.   multivitamin with minerals Tabs tablet Take 1 tablet by mouth daily.   potassium chloride SA 20 MEQ tablet Commonly known as: KLOR-CON Take 20 mEq by mouth daily.   rosuvastatin 20 MG tablet Commonly known as: CRESTOR Take 20 mg by mouth at bedtime.   SYSTANE OP Place 1 drop into both eyes daily as needed (dry eyes).   Vitamin D3 25 MCG (1000 UT) Caps Take 1,000 Units by mouth daily.  Allergies: No Known Allergies  Past Medical History, Surgical history, Social history, and Family History were reviewed and updated.  Review of Systems: All other 10 point review of systems is negative.   Physical Exam:  height is 5\' 11"  (1.803 m) and weight is 218 lb (98.9 kg). His oral temperature is 98.2 F (36.8 C). His blood pressure is 173/83 (abnormal) and his pulse is 62. His respiration is 18 and oxygen saturation is 100%.   Wt Readings from Last 3 Encounters:  05/29/20 218 lb (98.9 kg)  04/30/20 217 lb 12 oz (98.8 kg)  04/03/20 218 lb 1.9 oz (98.9 kg)    Ocular: Sclerae unicteric, pupils equal, round and reactive to  light Ear-nose-throat: Oropharynx clear, dentition fair Lymphatic: No cervical or supraclavicular adenopathy Lungs no rales or rhonchi, good excursion bilaterally Heart regular rate and rhythm, no murmur appreciated Abd soft, nontender, positive bowel sounds MSK no focal spinal tenderness, no joint edema Neuro: non-focal, well-oriented, appropriate affect Breasts: Deferred   Lab Results  Component Value Date   WBC 5.6 05/29/2020   HGB 11.1 (L) 05/29/2020   HCT 33.3 (L) 05/29/2020   MCV 97.7 05/29/2020   PLT 176 05/29/2020   Lab Results  Component Value Date   FERRITIN 1,061 (H) 06/28/2019   IRON 82 06/28/2019   TIBC 281 06/28/2019   UIBC 200 06/28/2019   IRONPCTSAT 29 06/28/2019   Lab Results  Component Value Date   RETICCTPCT 2.2 04/05/2019   RBC 3.41 (L) 05/29/2020   Lab Results  Component Value Date   KPAFRELGTCHN 11.2 04/30/2020   LAMBDASER 4.9 (L) 04/30/2020   KAPLAMBRATIO 2.29 (H) 04/30/2020   Lab Results  Component Value Date   IGGSERUM 480 (L) 04/30/2020   IGA 16 (L) 04/30/2020   IGMSERUM 31 04/30/2020   Lab Results  Component Value Date   TOTALPROTELP 5.7 (L) 04/30/2020   ALBUMINELP 3.4 04/30/2020   A1GS 0.2 04/30/2020   A2GS 0.9 04/30/2020   BETS 0.8 04/30/2020   BETA2SER 0.3 01/03/2015   GAMS 0.5 04/30/2020   MSPIKE Not Observed 04/30/2020   SPEI Comment 03/06/2020     Chemistry      Component Value Date/Time   NA 137 05/29/2020 0843   NA 137 04/08/2017 1141   NA 138 10/29/2015 1029   K 3.6 05/29/2020 0843   K 3.4 04/08/2017 1141   K 3.9 10/29/2015 1029   CL 101 05/29/2020 0843   CL 102 04/08/2017 1141   CO2 30 05/29/2020 0843   CO2 30 04/08/2017 1141   CO2 27 10/29/2015 1029   BUN 20 05/29/2020 0843   BUN 16 04/08/2017 1141   BUN 13.8 10/29/2015 1029   CREATININE 1.15 05/29/2020 0843   CREATININE 1.3 (H) 04/08/2017 1141   CREATININE 1.1 10/29/2015 1029      Component Value Date/Time   CALCIUM 10.1 05/29/2020 0843   CALCIUM 9.3  04/08/2017 1141   CALCIUM 9.3 10/29/2015 1029   ALKPHOS 47 05/29/2020 0843   ALKPHOS 66 04/08/2017 1141   ALKPHOS 52 10/29/2015 1029   AST 16 05/29/2020 0843   AST 20 10/29/2015 1029   ALT 12 05/29/2020 0843   ALT 21 04/08/2017 1141   ALT 14 10/29/2015 1029   BILITOT 0.9 05/29/2020 0843   BILITOT 0.75 10/29/2015 1029       Impression and Plan: Robert [name] is a very pleasant 87 yo African American gentleman with relapsed IgG kappa myeloma, stem cell transplant in 2006.  His protein studies  have remained stable on Daratumumab and he is tolerating well.  We will proceed with treatment today as planned.  No ESA needed for Hgb 11.1.  Zometa is on hold until March 2022 due to dental work.  Follow-up in 1 month.  He can contact our office with any questions or concerns.   Laverna Peace, NP 1/5/20229:13 AM

## 2020-05-29 NOTE — Telephone Encounter (Signed)
Per 05/29/20 los, 06/25/20 appts already in place      aom

## 2020-05-29 NOTE — Progress Notes (Signed)
Pt. refused to wait 30 minutes post injection. Stable and asymptomatic upon release.Pt discharged in no apparent distress. Pt left ambulatory without assistance. Pt aware of discharge instructions and verbalized understanding and had no further questions.

## 2020-05-29 NOTE — Patient Instructions (Signed)
Daratumumab injection What is this medicine? DARATUMUMAB (dar a toom ue mab) is a monoclonal antibody. It is used to treat multiple myeloma. This medicine may be used for other purposes; ask your health care provider or pharmacist if you have questions. COMMON BRAND NAME(S): DARZALEX What should I tell my health care provider before I take this medicine? They need to know if you have any of these conditions:  infection (especially a virus infection such as chickenpox, herpes, or hepatitis B virus)  lung or breathing disease  an unusual or allergic reaction to daratumumab, other medicines, foods, dyes, or preservatives  pregnant or trying to get pregnant  breast-feeding How should I use this medicine? This medicine is for infusion into a vein. It is given by a health care professional in a hospital or clinic setting. Talk to your pediatrician regarding the use of this medicine in children. Special care may be needed. Overdosage: If you think you have taken too much of this medicine contact a poison control center or emergency room at once. NOTE: This medicine is only for you. Do not share this medicine with others. What if I miss a dose? Keep appointments for follow-up doses as directed. It is important not to miss your dose. Call your doctor or health care professional if you are unable to keep an appointment. What may interact with this medicine? Interactions have not been studied. This list may not describe all possible interactions. Give your health care provider a list of all the medicines, herbs, non-prescription drugs, or dietary supplements you use. Also tell them if you smoke, drink alcohol, or use illegal drugs. Some items may interact with your medicine. What should I watch for while using this medicine? This drug may make you feel generally unwell. Report any side effects. Continue your course of treatment even though you feel ill unless your doctor tells you to stop. This  medicine can cause serious allergic reactions. To reduce your risk you may need to take medicine before treatment with this medicine. Take your medicine as directed. This medicine can affect the results of blood tests to match your blood type. These changes can last for up to 6 months after the final dose. Your healthcare provider will do blood tests to match your blood type before you start treatment. Tell all of your healthcare providers that you are being treated with this medicine before receiving a blood transfusion. This medicine can affect the results of some tests used to determine treatment response; extra tests may be needed to evaluate response. Do not become pregnant while taking this medicine or for 3 months after stopping it. Women should inform their doctor if they wish to become pregnant or think they might be pregnant. There is a potential for serious side effects to an unborn child. Talk to your health care professional or pharmacist for more information. What side effects may I notice from receiving this medicine? Side effects that you should report to your doctor or health care professional as soon as possible:  allergic reactions like skin rash, itching or hives, swelling of the face, lips, or tongue  breathing problems  chills  cough  dizziness  feeling faint or lightheaded  headache  low blood counts - this medicine may decrease the number of white blood cells, red blood cells and platelets. You may be at increased risk for infections and bleeding.  nausea, vomiting  shortness of breath  signs of decreased platelets or bleeding - bruising, pinpoint red spots on  the skin, black, tarry stools, blood in the urine  signs of decreased red blood cells - unusually weak or tired, feeling faint or lightheaded, falls  signs of infection - fever or chills, cough, sore throat, pain or difficulty passing urine  signs and symptoms of liver injury like dark yellow or brown  urine; general ill feeling or flu-like symptoms; light-colored stools; loss of appetite; right upper belly pain; unusually weak or tired; yellowing of the eyes or skin Side effects that usually do not require medical attention (report to your doctor or health care professional if they continue or are bothersome):  back pain  constipation  diarrhea  joint pain  muscle cramps  pain, tingling, numbness in the hands or feet  swelling of the ankles, feet, hands  tiredness  trouble sleeping This list may not describe all possible side effects. Call your doctor for medical advice about side effects. You may report side effects to FDA at 1-800-FDA-1088. Where should I keep my medicine? This drug is given in a hospital or clinic and will not be stored at home. NOTE: This sheet is a summary. It may not cover all possible information. If you have questions about this medicine, talk to your doctor, pharmacist, or health care provider.  2020 Elsevier/Gold Standard (2019-01-17 18:10:54)  

## 2020-05-30 ENCOUNTER — Ambulatory Visit (INDEPENDENT_AMBULATORY_CARE_PROVIDER_SITE_OTHER): Payer: Medicare Other | Admitting: Cardiology

## 2020-05-30 ENCOUNTER — Encounter: Payer: Self-pay | Admitting: Cardiology

## 2020-05-30 VITALS — BP 160/86 | HR 78 | Ht 71.0 in | Wt 220.0 lb

## 2020-05-30 DIAGNOSIS — I10 Essential (primary) hypertension: Secondary | ICD-10-CM

## 2020-05-30 DIAGNOSIS — C9001 Multiple myeloma in remission: Secondary | ICD-10-CM

## 2020-05-30 DIAGNOSIS — Z9481 Bone marrow transplant status: Secondary | ICD-10-CM | POA: Diagnosis not present

## 2020-05-30 LAB — PROTEIN ELECTROPHORESIS, SERUM, WITH REFLEX
A/G Ratio: 1.7 (ref 0.7–1.7)
Albumin ELP: 3.5 g/dL (ref 2.9–4.4)
Alpha-1-Globulin: 0.1 g/dL (ref 0.0–0.4)
Alpha-2-Globulin: 0.8 g/dL (ref 0.4–1.0)
Beta Globulin: 0.7 g/dL (ref 0.7–1.3)
Gamma Globulin: 0.4 g/dL (ref 0.4–1.8)
Globulin, Total: 2.1 g/dL — ABNORMAL LOW (ref 2.2–3.9)
Total Protein ELP: 5.6 g/dL — ABNORMAL LOW (ref 6.0–8.5)

## 2020-05-30 LAB — KAPPA/LAMBDA LIGHT CHAINS
Kappa free light chain: 9.8 mg/L (ref 3.3–19.4)
Kappa, lambda light chain ratio: 2.39 — ABNORMAL HIGH (ref 0.26–1.65)
Lambda free light chains: 4.1 mg/L — ABNORMAL LOW (ref 5.7–26.3)

## 2020-05-30 LAB — IGG, IGA, IGM
IgA: 19 mg/dL — ABNORMAL LOW (ref 61–437)
IgG (Immunoglobin G), Serum: 491 mg/dL — ABNORMAL LOW (ref 603–1613)
IgM (Immunoglobulin M), Srm: 26 mg/dL (ref 15–143)

## 2020-05-30 NOTE — Patient Instructions (Signed)

## 2020-05-30 NOTE — Progress Notes (Signed)
Cardiology Office Note:    Date:  05/30/2020   ID:  Robert [name], DOB 06-14-1933, MRN [id number]  PCP:  Leanna Battles, MD  Cardiologist:  Jenne Campus, MD    Referring MD: Leanna Battles, MD   Chief Complaint  Patient presents with  . Follow-up  I am doing fine  History of Present Illness:    Robert [name] is a 87 y.o. male with past medical history significant for essential hypertension which appears to be difficult to control, multiple myeloma status post bone marrow transplant, swelling of lower extremities.  He comes today 2 months of follow-up.  Overall he seems to be doing well.  Denies have any chest pain tightness squeezing pressure burning chest.  His blood pressure at home is always about 017/79 systolic and diastolic.  When he comes to our office is always elevated it looks like he does have whitecoat hypertension.  We did EKG today which showed first-degree AV block he is already on beta-blocker but overall first-degree AV block is noted concerning enough to cut down this medication.  We will continue present management.  Past Medical History:  Diagnosis Date  . Anemia of chronic renal failure, stage 3 (moderate) ([place]) 10/06/2018  . Arthritis   . B12 deficiency 02/17/2010  . Benign essential hypertension 03/19/2010  . Cerumen debris on tympanic membrane 11/28/2015  . Dyslipidemia 04/08/2015  . ED (erectile dysfunction) 06/02/2010  . 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 Glaucoma of both eyes 10/10/2019  . Goals of care, counseling/discussion 05/12/2018  . Groin lump 12/28/2016  . Hyperlipidemia   . Hypertension   . IgG multiple myeloma ([place]) 07/23/2004  . Iron deficiency anemia 10/07/2018  . 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 Lytic bone lesions on xray 03/17/2016  . Multiple myeloma ([place])    2006  . Multiple myeloma in remission ([place]) 03/19/2010  . Myeloma ([place]) 07/02/2011  . S/P autologous bone marrow transplantation ([place]) 04/29/2005  . Swelling of both lower  extremities 10/18/2018  . Syncope 12/24/2017  . White coat syndrome with diagnosis of hypertension 11/28/2015    Past Surgical History:  Procedure Laterality Date  . CATARACT EXTRACTION BILATERAL W/ ANTERIOR VITRECTOMY    . IR IMAGING GUIDED PORT INSERTION  05/30/2018  . LIMBAL STEM CELL TRANSPLANT      Current Medications: Current Meds  Medication Sig  . amoxicillin (AMOXIL) 500 MG capsule Take 500 mg by mouth 3 (three) times daily.  Marland Kitchen aspirin 81 MG tablet Take 81 mg by mouth daily.  . bimatoprost (LUMIGAN) 0.01 % SOLN Place 1 drop into both eyes at bedtime.   . Calcium Carbonate-Vit D-Min (CALTRATE 600+D PLUS PO) Take 1 tablet by mouth daily.  . Cholecalciferol (VITAMIN D3) 1000 UNITS CAPS Take 1,000 Units by mouth daily.   . ciclopirox (LOPROX) 0.77 % cream APPLY CREAM TOPICALLY ONCE DAILY  . cloNIDine (CATAPRES) 0.1 MG tablet Take 0.1 mg by mouth 2 (two) times daily as needed.  . cyanocobalamin 2000 MCG tablet Take 2,000 mcg by mouth at bedtime.   . famciclovir (FAMVIR) 250 MG tablet TAKE 1 TABLET DAILY  . furosemide (LASIX) 20 MG tablet Take 2 tablets (40 mg total) by mouth daily. (Patient taking differently: Take 40 mg by mouth daily as needed.)  . losartan (COZAAR) 100 MG tablet Take 100 mg by mouth daily.  . metolazone (ZAROXOLYN) 5 MG tablet TAKE 1 TABLET 1 HOUR PRIOR TO TAKING LASIX DAILY  . montelukast (SINGULAIR) 10 MG tablet Take 1 tablet (10  mg total) by mouth at bedtime.  . Multiple Vitamin (MULITIVITAMIN WITH MINERALS) TABS Take 1 tablet by mouth daily.  Vladimir Faster Glycol-Propyl Glycol (SYSTANE OP) Place 1 drop into both eyes daily as needed (dry eyes).   . potassium chloride SA (K-DUR,KLOR-CON) 20 MEQ tablet Take 20 mEq by mouth daily.  . rosuvastatin (CRESTOR) 20 MG tablet Take 20 mg by mouth at bedtime.  . [DISCONTINUED] carvedilol (COREG) 12.5 MG tablet Take 1 tablet (12.5 mg total) by mouth 2 (two) times daily with a meal.     Allergies:   Patient has no known  allergies.   Social History   Socioeconomic History  . Marital status: Married    Spouse name: Not on file  . Number of children: Not on file  . Years of education: Not on file  . Highest education level: Not on file  Occupational History  . Not on file  Tobacco Use  . Smoking status: Former Smoker    Packs/day: 4.00    Years: 9.00    Pack years: 36.00    Types: Cigars, Cigarettes    Start date: 07/08/1979    Quit date: 07/07/1988    Years since quitting: 31.9  . Smokeless tobacco: Never Used  . Tobacco comment: quit 24 years ago  Vaping Use  . Vaping Use: Never used  Substance and Sexual Activity  . Alcohol use: Yes    Alcohol/week: 6.0 standard drinks    Types: 6 Glasses of wine per week    Comment: very seldom  . Drug use: No  . Sexual activity: Not on file  Other Topics Concern  . Not on file  Social History Narrative  . Not on file   Social Determinants of Health   Financial Resource Strain: Not on file  Food Insecurity: Not on file  Transportation Needs: Not on file  Physical Activity: Not on file  Stress: Not on file  Social Connections: Not on file     Family History: The patient's family history includes Diabetes in his brother; Heart attack in his father; Heart disease in his father; Throat cancer in his mother. There is no history of Colon cancer. ROS:   Please see the history of present illness.    All 14 point review of systems negative except as described per history of present illness  EKGs/Labs/Other Studies Reviewed:      Recent Labs: 05/29/2020: ALT 12; BUN 20; Creatinine 1.15; Hemoglobin 11.1; Platelet Count 176; Potassium 3.6; Sodium 137  Recent Lipid Panel No results found for: CHOL, TRIG, HDL, CHOLHDL, VLDL, LDLCALC, LDLDIRECT  Physical Exam:    VS:  BP (!) 160/86 (BP Location: Left Arm, Patient Position: Sitting)   Pulse 78   Ht _0  (1.803 m)   Wt 220 lb (99.8 kg)   SpO2 98%   BMI 30.68 kg/m     Wt Readings from Last 3  Encounters:  05/30/20 220 lb (99.8 kg)  05/29/20 218 lb (98.9 kg)  04/30/20 217 lb 12 oz (98.8 kg)     GEN:  Well nourished, well developed in no acute distress HEENT: Normal NECK: No JVD; No carotid bruits LYMPHATICS: No lymphadenopathy CARDIAC: RRR, no murmurs, no rubs, no gallops RESPIRATORY:  Clear to auscultation without rales, wheezing or rhonchi  ABDOMEN: Soft, non-tender, non-distended MUSCULOSKELETAL:  No edema; No deformity  SKIN: Warm and dry LOWER EXTREMITIES: no swelling NEUROLOGIC:  Alert and oriented x 3 PSYCHIATRIC:  Normal affect   ASSESSMENT:    1.  White coat syndrome with diagnosis of hypertension   2. Benign essential hypertension   3. Multiple myeloma in remission ([place])   4. S/P autologous bone marrow transplantation ([place])    PLAN:    In order of problems listed above:  1. Whitecoat syndrome.  I encouraged him to check his blood pressure on the regular basis.  He said that he does have 2 blood pressure monitors and there are some discrepancy between those 2 monitors, therefore, I asked him to bring this monitor to [place] next time will be here. 2. Multiple myeloma follow-up excellently like always by oncology team. 3. Status post bone marrow transplant.  She is doing well from that point review. 4. Dyslipidemia I do have her is fasting lipid profile from a 2020 September with LDL 114 HDL 49.  We will make arrangements for fasting profile to be checked   Medication Adjustments/Labs and Tests Ordered: Current medicines are reviewed at length with the patient today.  Concerns regarding medicines are outlined above.  No orders of the defined types were placed in this encounter.  Medication changes: No orders of the defined types were placed in this encounter.   Signed, Park Liter, MD, [facility] 05/30/2020 1:40 PM    [place]

## 2020-06-26 ENCOUNTER — Other Ambulatory Visit: Payer: Self-pay

## 2020-06-26 ENCOUNTER — Inpatient Hospital Stay: Payer: Medicare Other | Attending: Hematology & Oncology

## 2020-06-26 ENCOUNTER — Inpatient Hospital Stay (HOSPITAL_BASED_OUTPATIENT_CLINIC_OR_DEPARTMENT_OTHER): Payer: Medicare Other | Admitting: Hematology & Oncology

## 2020-06-26 ENCOUNTER — Encounter: Payer: Self-pay | Admitting: Hematology & Oncology

## 2020-06-26 ENCOUNTER — Telehealth: Payer: Self-pay

## 2020-06-26 ENCOUNTER — Inpatient Hospital Stay: Payer: Medicare Other

## 2020-06-26 VITALS — BP 142/74 | HR 61 | Temp 97.4°F | Resp 18 | Wt 217.0 lb

## 2020-06-26 VITALS — BP 146/76 | HR 53

## 2020-06-26 DIAGNOSIS — Z5112 Encounter for antineoplastic immunotherapy: Secondary | ICD-10-CM | POA: Diagnosis not present

## 2020-06-26 DIAGNOSIS — C9002 Multiple myeloma in relapse: Secondary | ICD-10-CM | POA: Insufficient documentation

## 2020-06-26 DIAGNOSIS — C9000 Multiple myeloma not having achieved remission: Secondary | ICD-10-CM

## 2020-06-26 DIAGNOSIS — Z79899 Other long term (current) drug therapy: Secondary | ICD-10-CM | POA: Diagnosis not present

## 2020-06-26 DIAGNOSIS — C9001 Multiple myeloma in remission: Secondary | ICD-10-CM

## 2020-06-26 LAB — CMP (CANCER CENTER ONLY)
ALT: 12 U/L (ref 0–44)
AST: 16 U/L (ref 15–41)
Albumin: 3.9 g/dL (ref 3.5–5.0)
Alkaline Phosphatase: 45 U/L (ref 38–126)
Anion gap: 6 (ref 5–15)
BUN: 21 mg/dL (ref 8–23)
CO2: 29 mmol/L (ref 22–32)
Calcium: 10.1 mg/dL (ref 8.9–10.3)
Chloride: 102 mmol/L (ref 98–111)
Creatinine: 1.16 mg/dL (ref 0.61–1.24)
GFR, Estimated: >60 mL/min (ref >60)
Glucose, Bld: 104 mg/dL — ABNORMAL HIGH (ref 70–99)
Potassium: 3.7 mmol/L (ref 3.5–5.1)
Sodium: 137 mmol/L (ref 135–145)
Total Bilirubin: 0.7 mg/dL (ref 0.3–1.2)
Total Protein: 5.8 g/dL — ABNORMAL LOW (ref 6.5–8.1)

## 2020-06-26 LAB — CBC WITH DIFFERENTIAL (CANCER CENTER ONLY)
Abs Immature Granulocytes: 0.06 10*3/uL (ref 0.00–0.07)
Basophils Absolute: 0 10*3/uL (ref 0.0–0.1)
Basophils Relative: 1 %
Eosinophils Absolute: 0.2 10*3/uL (ref 0.0–0.5)
Eosinophils Relative: 3 %
HCT: 32.9 % — ABNORMAL LOW (ref 39.0–52.0)
Hemoglobin: 11.1 g/dL — ABNORMAL LOW (ref 13.0–17.0)
Immature Granulocytes: 1 %
Lymphocytes Relative: 35 %
Lymphs Abs: 2 10*3/uL (ref 0.7–4.0)
MCH: 32.3 pg (ref 26.0–34.0)
MCHC: 33.7 g/dL (ref 30.0–36.0)
MCV: 95.6 fL (ref 80.0–100.0)
Monocytes Absolute: 0.7 10*3/uL (ref 0.1–1.0)
Monocytes Relative: 12 %
Neutro Abs: 2.8 10*3/uL (ref 1.7–7.7)
Neutrophils Relative %: 48 %
Platelet Count: 178 10*3/uL (ref 150–400)
RBC: 3.44 MIL/uL — ABNORMAL LOW (ref 4.22–5.81)
RDW: 12.8 % (ref 11.5–15.5)
WBC Count: 5.6 10*3/uL (ref 4.0–10.5)
nRBC: 0 % (ref 0.0–0.2)

## 2020-06-26 LAB — LACTATE DEHYDROGENASE: LDH: 172 U/L (ref 98–192)

## 2020-06-26 MED ORDER — HEPARIN SOD (PORK) LOCK FLUSH 100 UNIT/ML IV SOLN
500.0000 [IU] | Freq: Once | INTRAVENOUS | Status: AC | PRN
  Administered 2020-06-26: 500 [IU]
  Filled 2020-06-26: qty 5

## 2020-06-26 MED ORDER — DEXAMETHASONE 4 MG PO TABS
ORAL_TABLET | ORAL | Status: AC
  Filled 2020-06-26: qty 5

## 2020-06-26 MED ORDER — DIPHENHYDRAMINE HCL 25 MG PO CAPS
50.0000 mg | ORAL_CAPSULE | Freq: Once | ORAL | Status: AC
  Administered 2020-06-26: 50 mg via ORAL

## 2020-06-26 MED ORDER — SODIUM CHLORIDE 0.9% FLUSH
10.0000 mL | INTRAVENOUS | Status: DC | PRN
  Administered 2020-06-26: 10 mL
  Filled 2020-06-26: qty 10

## 2020-06-26 MED ORDER — DEXAMETHASONE 4 MG PO TABS
20.0000 mg | ORAL_TABLET | Freq: Once | ORAL | Status: AC
  Administered 2020-06-26: 20 mg via ORAL

## 2020-06-26 MED ORDER — ACETAMINOPHEN 325 MG PO TABS
ORAL_TABLET | ORAL | Status: AC
  Filled 2020-06-26: qty 2

## 2020-06-26 MED ORDER — DIPHENHYDRAMINE HCL 25 MG PO CAPS
ORAL_CAPSULE | ORAL | Status: AC
  Filled 2020-06-26: qty 2

## 2020-06-26 MED ORDER — DARATUMUMAB-HYALURONIDASE-FIHJ 1800-30000 MG-UT/15ML ~~LOC~~ SOLN
1800.0000 mg | Freq: Once | SUBCUTANEOUS | Status: AC
  Administered 2020-06-26: 1800 mg via SUBCUTANEOUS
  Filled 2020-06-26: qty 15

## 2020-06-26 MED ORDER — MONTELUKAST SODIUM 10 MG PO TABS: 10.0000 mg | ORAL_TABLET | Freq: Every day | ORAL | 5 refills | Status: DC

## 2020-06-26 MED ORDER — ACETAMINOPHEN 325 MG PO TABS
650.0000 mg | ORAL_TABLET | Freq: Once | ORAL | Status: AC
  Administered 2020-06-26: 650 mg via ORAL

## 2020-06-26 NOTE — Progress Notes (Signed)
Hematology and Oncology Follow Up Visit  MARTHA GUERRY [id number] 10/02/1933 87 y.o. 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 Aranesp 300 mcg subcu q. 3 weeks for hemoglobin less than 10   Interim History:  Mr. Lennon is here today for follow-up and treatment.  He really looks great.  He actually had a nice holiday season.  He and his wife basically stayed home.  He has had no issues with fever.  He is very cautious with the coronavirus.  More last saw him, his blood did not contain a monoclonal spike.  His IgG level was 491 mg/dL.  His Kappa light chain was 1 mg/dL.  All this is stable.  He has had no issues with rashes.  He has had no leg swelling.  He is trying to lose weight.  He is not exercising like he needs to.  Hopefully, with warmer weather, he will exercise more.  He has had no cough.  He has had no nausea or vomiting.  There has been no change in bowel or bladder habits.  Hopefully, all the dental work is done with respect to a root canal.  We will go ahead and get the Zometa restarted in March.      Medications:  Allergies as of 06/26/2020   No Known Allergies     Medication List       Accurate as of June 26, 2020  8:21 AM. If you have any questions, ask your nurse or doctor.        amoxicillin 500 MG capsule Commonly known as: AMOXIL Take 500 mg by mouth 3 (three) times daily.   aspirin 81 MG tablet Take 81 mg by mouth daily.   bimatoprost 0.01 % Soln Commonly known as: LUMIGAN Place 1 drop into both eyes at bedtime.    CALTRATE 600+D PLUS PO Take 1 tablet by mouth daily.   ciclopirox 0.77 % cream Commonly known as: LOPROX APPLY CREAM TOPICALLY ONCE DAILY   cloNIDine 0.1 MG tablet Commonly known as: CATAPRES Take 0.1 mg by mouth 2 (two) times daily as needed.   cyanocobalamin 2000 MCG tablet Take 2,000 mcg by mouth at bedtime.   famciclovir 250 MG tablet Commonly known as: FAMVIR TAKE 1 TABLET DAILY   furosemide 20 MG tablet Commonly known as: LASIX Take 2 tablets (40 mg total) by mouth daily. What changed:   when to take this  reasons to take this   losartan 100 MG tablet Commonly known as: COZAAR Take 100 mg by mouth daily.   metolazone 5 MG tablet Commonly known as: ZAROXOLYN TAKE 1 TABLET 1 HOUR PRIOR TO TAKING LASIX DAILY   montelukast 10 MG tablet Commonly known as: Singulair Take 1 tablet (10 mg total) by mouth at bedtime.   multivitamin with minerals Tabs tablet Take 1 tablet by mouth daily.   potassium chloride SA 20 MEQ tablet Commonly known as: KLOR-CON Take 20 mEq by mouth daily.   rosuvastatin 20 MG tablet Commonly known as: CRESTOR Take 20 mg by mouth at  bedtime.   SYSTANE OP Place 1 drop into both eyes daily as needed (dry eyes).   Vitamin D3 25 MCG (1000 UT) Caps Take 1,000 Units by mouth daily.       Allergies: No Known Allergies  Past Medical History, Surgical history, Social history, and Family History were reviewed and updated.  Review of Systems: Review of Systems  Constitutional: Negative.   HENT: Negative.   Eyes: Negative.   Respiratory: Negative.   Cardiovascular: Negative.   Gastrointestinal: Negative.   Genitourinary: Negative.   Musculoskeletal: Negative.   Skin: Negative.   Neurological: Negative.   Endo/Heme/Allergies: Negative.   Psychiatric/Behavioral: Negative.       Physical Exam:  weight is 217 lb (98.4 kg). His oral temperature is 97.4 F (36.3 C) (abnormal). His blood pressure is 142/74 (abnormal) and his pulse  is 61. His respiration is 18 and oxygen saturation is 100%.   Wt Readings from Last 3 Encounters:  06/26/20 217 lb (98.4 kg)  05/30/20 220 lb (99.8 kg)  05/29/20 218 lb (98.9 kg)    Physical Exam Vitals reviewed.  HENT:     Head: Normocephalic and atraumatic.  Eyes:     Pupils: Pupils are equal, round, and reactive to light.  Cardiovascular:     Rate and Rhythm: Normal rate and regular rhythm.     Heart sounds: Normal heart sounds.  Pulmonary:     Effort: Pulmonary effort is normal.     Breath sounds: Normal breath sounds.  Abdominal:     General: Bowel sounds are normal.     Palpations: Abdomen is soft.  Musculoskeletal:        General: No tenderness or deformity. Normal range of motion.     Cervical back: Normal range of motion.  Lymphadenopathy:     Cervical: No cervical adenopathy.  Skin:    General: Skin is warm and dry.     Findings: No erythema or rash.  Neurological:     Mental Status: He is alert and oriented to person, place, and time.  Psychiatric:        Behavior: Behavior normal.        Thought Content: Thought content normal.        Judgment: Judgment normal.      Lab Results  Component Value Date   WBC 5.6 05/29/2020   HGB 11.1 (L) 05/29/2020   HCT 33.3 (L) 05/29/2020   MCV 97.7 05/29/2020   PLT 176 05/29/2020   Lab Results  Component Value Date   FERRITIN 1,061 (H) 06/28/2019   IRON 82 06/28/2019   TIBC 281 06/28/2019   UIBC 200 06/28/2019   IRONPCTSAT 29 06/28/2019   Lab Results  Component Value Date   RETICCTPCT 2.2 04/05/2019   RBC 3.41 (L) 05/29/2020   Lab Results  Component Value Date   KPAFRELGTCHN 9.8 05/29/2020   LAMBDASER 4.1 (L) 05/29/2020   KAPLAMBRATIO 2.39 (H) 05/29/2020   Lab Results  Component Value Date   IGGSERUM 491 (L) 05/29/2020   IGA 19 (L) 05/29/2020   IGMSERUM 26 05/29/2020   Lab Results  Component Value Date   TOTALPROTELP 5.6 (L) 05/29/2020   ALBUMINELP 3.5 05/29/2020   A1GS 0.1 05/29/2020   A2GS  0.8 05/29/2020   BETS 0.7 05/29/2020   BETA2SER 0.3 01/03/2015   GAMS 0.4 05/29/2020   MSPIKE Not Observed 05/29/2020   SPEI Comment 03/06/2020     Chemistry      Component Value Date/Time   NA 137 05/29/2020  0843   NA 137 04/08/2017 1141   NA 138 10/29/2015 1029   K 3.6 05/29/2020 0843   K 3.4 04/08/2017 1141   K 3.9 10/29/2015 1029   CL 101 05/29/2020 0843   CL 102 04/08/2017 1141   CO2 30 05/29/2020 0843   CO2 30 04/08/2017 1141   CO2 27 10/29/2015 1029   BUN 20 05/29/2020 0843   BUN 16 04/08/2017 1141   BUN 13.8 10/29/2015 1029   CREATININE 1.15 05/29/2020 0843   CREATININE 1.3 (H) 04/08/2017 1141   CREATININE 1.1 10/29/2015 1029      Component Value Date/Time   CALCIUM 10.1 05/29/2020 0843   CALCIUM 9.3 04/08/2017 1141   CALCIUM 9.3 10/29/2015 1029   ALKPHOS 47 05/29/2020 0843   ALKPHOS 66 04/08/2017 1141   ALKPHOS 52 10/29/2015 1029   AST 16 05/29/2020 0843   AST 20 10/29/2015 1029   ALT 12 05/29/2020 0843   ALT 21 04/08/2017 1141   ALT 14 10/29/2015 1029   BILITOT 0.9 05/29/2020 0843   BILITOT 0.75 10/29/2015 1029       Impression and Plan: Mr. Novinger is a very pleasant 87 yo African American gentleman with relapsed IgG kappa myeloma. He has history of stem cell transplant in 2006.   He has tolerated Daratumumab well and has had a nice response.  He still is in remission.  I am just happy that his quality life is doing well.  His blood pressure is under better control.  He is more active.  We will get him back in 1 more month.  Again, we will give him the Zometa in March.     Volanda Napoleon, MD 2/2/20228:21 AM

## 2020-06-26 NOTE — Patient Instructions (Addendum)
Dexamethasone tablets What is this medicine? DEXAMETHASONE (dex a METH a sone) is a corticosteroid. It is commonly used to treat inflammation of the skin, joints, lungs, and other organs. Common conditions treated include asthma, allergies, and arthritis. It is also used for other conditions, such as blood disorders and diseases of the adrenal glands. This medicine may be used for other purposes; ask your health care provider or pharmacist if you have questions. COMMON BRAND NAME(S): CUSHINGS SYNDROME DIAGNOSTIC, Decadron, Dexabliss, DexPak Jr TaperPak, DexPak TaperPak, Dxevo, Hemady, HiDex, TaperDex, ZCORT, Zema-Pak, ZoDex, ZonaCort 11 Day, ZonaCort 7 Day What should I tell my health care provider before I take this medicine? They need to know if you have any of these conditions:  Cushing's syndrome  diabetes  glaucoma  heart disease  high blood pressure  infection like herpes, measles, tuberculosis, or chickenpox  kidney disease  liver disease  mental illness  myasthenia gravis  osteoporosis  previous heart attack  seizures  stomach or intestine problems  thyroid disease  an unusual or allergic reaction to dexamethasone, corticosteroids, other medicines, lactose, foods, dyes, or preservatives  pregnant or trying to get pregnant  breast-feeding How should I use this medicine? Take this medicine by mouth with a drink of water. Follow the directions on the prescription label. Take it with food or milk to avoid stomach upset. If you are taking this medicine once a day, take it in the morning. Do not take more medicine than you are told to take. Do not suddenly stop taking your medicine because you may develop a severe reaction. Your doctor will tell you how much medicine to take. If your doctor wants you to stop the medicine, the dose may be slowly lowered over time to avoid any side effects. Talk to your pediatrician regarding the use of this medicine in children. Special  care may be needed. Patients over 22 years old may have a stronger reaction and need a smaller dose. Overdosage: If you think you have taken too much of this medicine contact a poison control center or emergency room at once. NOTE: This medicine is only for you. Do not share this medicine with others. What if I miss a dose? If you miss a dose, take it as soon as you can. If it is almost time for your next dose, talk to your doctor or health care professional. You may need to miss a dose or take an extra dose. Do not take double or extra doses without advice. What may interact with this medicine? Do not take this medicine with any of the following medications:  live virus vaccines This medicine may also interact with the following medications:  aminoglutethimide  amphotericin B  aspirin and aspirin-like medicines  certain antibiotics like erythromycin, clarithromycin, and troleandomycin  certain antivirals for HIV or hepatitis  certain medicines for seizures like carbamazepine, phenobarbital, phenytoin  certain medicines to treat myasthenia gravis  cholestyramine  cyclosporine  digoxin  diuretics  ephedrine  male hormones, like estrogen or progestins and birth control pills  insulin or other medicines for diabetes  isoniazid  ketoconazole  medicines that relax muscles for surgery  mifepristone  NSAIDs, medicines for pain and inflammation, like ibuprofen or naproxen  rifampin  skin tests for allergies  thalidomide  vaccines  warfarin This list may not describe all possible interactions. Give your health care provider a list of all the medicines, herbs, non-prescription drugs, or dietary supplements you use. Also tell them if you smoke, drink alcohol, or  use illegal drugs. Some items may interact with your medicine. What should I watch for while using this medicine? Visit your health care professional for regular checks on your progress. Tell your health  care professional if your symptoms do not start to get better or if they get worse. Your condition will be monitored carefully while you are receiving this medicine. Wear a medical ID bracelet or chain. Carry a card that describes your disease and details of your medicine and dosage times. This medicine may increase your risk of getting an infection. Call your health care professional for advice if you get a fever, chills, or sore throat, or other symptoms of a cold or flu. Do not treat yourself. Try to avoid being around people who are sick. Call your health care professional if you are around anyone with measles, chickenpox, or if you develop sores or blisters that do not heal properly. If you are going to need surgery or other procedures, tell your doctor or health care professional that you have taken this medicine within the last 12 months. Ask your doctor or health care professional about your diet. You may need to lower the amount of salt you eat. This medicine may increase blood sugar. Ask your healthcare provider if changes in diet or medicines are needed if you have diabetes. What side effects may I notice from receiving this medicine? Side effects that you should report to your doctor or health care professional as soon as possible:  allergic reactions like skin rash, itching or hives, swelling of the face, lips, or tongue  bloody or black, tarry stools  changes in emotions or moods  changes in vision  confusion, excitement, restlessness  depressed mood  eye pain  hallucinations  fever or chills, cough, sore throat, pain or difficulty passing urine  muscle weakness  severe or sudden stomach or belly pain  signs and symptoms of high blood sugar such as being more thirsty or hungry or having to urinate more than normal. You may also feel very tired or have blurry vision.  signs and symptoms of infection like fever; chills; cough; sore throat; pain or trouble passing  urine  swelling of ankles, feet  unusual bruising or bleeding  wounds that do not heal Side effects that usually do not require medical attention (report to your doctor or health care professional if they continue or are bothersome):  increased appetite  increased growth of face or body hair  headache  nausea, vomiting  skin problems, acne, thin and shiny skin  trouble sleeping  weight gain This list may not describe all possible side effects. Call your doctor for medical advice about side effects. You may report side effects to FDA at 1-800-FDA-1088. Where should I keep my medicine? Keep out of the reach of children. Store at room temperature between 20 and 25 degrees C (68 and 77 degrees F). Protect from light. Throw away any unused medicine after the expiration date. NOTE: This sheet is a summary. It may not cover all possible information. If you have questions about this medicine, talk to your doctor, pharmacist, or health care provider.  2021 Elsevier/Gold Standard (2018-11-22 14:23:34) Acetaminophen tablets or caplets What is this medicine? ACETAMINOPHEN (a set a MEE noe fen) is a pain reliever. It is used to treat mild pain and fever. This medicine may be used for other purposes; ask your health care provider or pharmacist if you have questions. COMMON BRAND NAME(S): Aceta, Actamin, Anacin Aspirin Free, Genapap, Genebs, Mapap,  Pain & Fever, Pain and Fever, PAIN RELIEF, PAIN RELIEF Extra Strength, Pain Reliever, Panadol, PHARBETOL, Plus PHARMA, Q-Pap, Q-Pap Extra Strength, Tylenol, Tylenol CrushableTablet, Tylenol Extra Strength, Tylenol Regular Strength, XS No Aspirin, XS Pain Reliever What should I tell my health care provider before I take this medicine? They need to know if you have any of these conditions:  if you often drink alcohol  liver disease  an unusual or allergic reaction to acetaminophen, other medicines, foods, dyes, or preservatives  pregnant or  trying to get pregnant  breast-feeding How should I use this medicine? Take this medicine by mouth with a glass of water. Follow the directions on the package or prescription label. Take your medicine at regular intervals. Do not take your medicine more often than directed. Talk to your pediatrician regarding the use of this medicine in children. While this drug may be prescribed for children as young as 15 years of age for selected conditions, precautions do apply. Overdosage: If you think you have taken too much of this medicine contact a poison control center or emergency room at once. NOTE: This medicine is only for you. Do not share this medicine with others. What if I miss a dose? If you miss a dose, take it as soon as you can. If it is almost time for your next dose, take only that dose. Do not take double or extra doses. What may interact with this medicine?  alcohol  imatinib  isoniazid  other medicines with acetaminophen This list may not describe all possible interactions. Give your health care provider a list of all the medicines, herbs, non-prescription drugs, or dietary supplements you use. Also tell them if you smoke, drink alcohol, or use illegal drugs. Some items may interact with your medicine. What should I watch for while using this medicine? Tell your doctor or health care professional if the pain lasts more than 10 days (5 days for children), if it gets worse, or if there is a new or different kind of pain. Also, check with your doctor if a fever lasts for more than 3 days. Do not take other medicines that contain acetaminophen with this medicine. Always read labels carefully. If you have questions, ask your doctor or pharmacist. If you take too much acetaminophen get medical help right away. Too much acetaminophen can be very dangerous and cause liver damage. Even if you do not have symptoms, it is important to get help right away. What side effects may I notice from  receiving this medicine? Side effects that you should report to your doctor or health care professional as soon as possible:  allergic reactions like skin rash, itching or hives, swelling of the face, lips, or tongue  breathing problems  fever or sore throat  redness, blistering, peeling or loosening of the skin, including inside the mouth  trouble passing urine or change in the amount of urine  unusual bleeding or bruising  unusually weak or tired  yellowing of the eyes or skin Side effects that usually do not require medical attention (report to your doctor or health care professional if they continue or are bothersome):  headache  nausea, stomach upset This list may not describe all possible side effects. Call your doctor for medical advice about side effects. You may report side effects to FDA at 1-800-FDA-1088. Where should I keep my medicine? Keep out of reach of children. Store at room temperature between 20 and 25 degrees C (68 and 77 degrees F). Protect from  moisture and heat. Throw away any unused medicine after the expiration date. NOTE: This sheet is a summary. It may not cover all possible information. If you have questions about this medicine, talk to your doctor, pharmacist, or health care provider.  2021 Elsevier/Gold Standard (2013-01-02 12:54:16)

## 2020-06-26 NOTE — Telephone Encounter (Signed)
appts already in place for 07/23/20, appts added for 08/20/20, pt to gain appts in tx/avs    Robert [name]

## 2020-06-26 NOTE — Patient Instructions (Signed)

## 2020-06-27 LAB — PROTEIN ELECTROPHORESIS, SERUM
A/G Ratio: 1.5 (ref 0.7–1.7)
Albumin ELP: 3.5 g/dL (ref 2.9–4.4)
Alpha-1-Globulin: 0.2 g/dL (ref 0.0–0.4)
Alpha-2-Globulin: 0.8 g/dL (ref 0.4–1.0)
Beta Globulin: 0.9 g/dL (ref 0.7–1.3)
Gamma Globulin: 0.5 g/dL (ref 0.4–1.8)
Globulin, Total: 2.4 g/dL (ref 2.2–3.9)
Total Protein ELP: 5.9 g/dL — ABNORMAL LOW (ref 6.0–8.5)

## 2020-06-27 LAB — IGG, IGA, IGM
IgA: 21 mg/dL — ABNORMAL LOW (ref 61–437)
IgG (Immunoglobin G), Serum: 502 mg/dL — ABNORMAL LOW (ref 603–1613)
IgM (Immunoglobulin M), Srm: 28 mg/dL (ref 15–143)

## 2020-06-27 LAB — KAPPA/LAMBDA LIGHT CHAINS
Kappa free light chain: 10.1 mg/L (ref 3.3–19.4)
Kappa, lambda light chain ratio: 2.2 — ABNORMAL HIGH (ref 0.26–1.65)
Lambda free light chains: 4.6 mg/L — ABNORMAL LOW (ref 5.7–26.3)

## 2020-07-23 ENCOUNTER — Telehealth: Payer: Self-pay

## 2020-07-23 ENCOUNTER — Inpatient Hospital Stay: Payer: Medicare Other

## 2020-07-23 ENCOUNTER — Inpatient Hospital Stay: Payer: Medicare Other | Attending: Hematology & Oncology

## 2020-07-23 ENCOUNTER — Encounter: Payer: Self-pay | Admitting: Hematology & Oncology

## 2020-07-23 ENCOUNTER — Other Ambulatory Visit (HOSPITAL_COMMUNITY): Payer: Self-pay | Admitting: Hematology & Oncology

## 2020-07-23 ENCOUNTER — Other Ambulatory Visit: Payer: Self-pay

## 2020-07-23 ENCOUNTER — Inpatient Hospital Stay (HOSPITAL_BASED_OUTPATIENT_CLINIC_OR_DEPARTMENT_OTHER): Payer: Medicare Other | Admitting: Hematology & Oncology

## 2020-07-23 VITALS — BP 136/70 | HR 69 | Temp 98.5°F | Resp 18 | Wt 219.0 lb

## 2020-07-23 VITALS — BP 156/73 | HR 56 | Resp 18

## 2020-07-23 DIAGNOSIS — D631 Anemia in chronic kidney disease: Secondary | ICD-10-CM

## 2020-07-23 DIAGNOSIS — C9002 Multiple myeloma in relapse: Secondary | ICD-10-CM | POA: Diagnosis present

## 2020-07-23 DIAGNOSIS — Z5112 Encounter for antineoplastic immunotherapy: Secondary | ICD-10-CM | POA: Insufficient documentation

## 2020-07-23 DIAGNOSIS — C9001 Multiple myeloma in remission: Secondary | ICD-10-CM

## 2020-07-23 DIAGNOSIS — Z79899 Other long term (current) drug therapy: Secondary | ICD-10-CM | POA: Insufficient documentation

## 2020-07-23 DIAGNOSIS — C9000 Multiple myeloma not having achieved remission: Secondary | ICD-10-CM

## 2020-07-23 DIAGNOSIS — M899 Disorder of bone, unspecified: Secondary | ICD-10-CM

## 2020-07-23 DIAGNOSIS — N183 Chronic kidney disease, stage 3 unspecified: Secondary | ICD-10-CM

## 2020-07-23 LAB — CMP (CANCER CENTER ONLY)
ALT: 12 U/L (ref 0–44)
AST: 16 U/L (ref 15–41)
Albumin: 4 g/dL (ref 3.5–5.0)
Alkaline Phosphatase: 52 U/L (ref 38–126)
Anion gap: 7 (ref 5–15)
BUN: 26 mg/dL — ABNORMAL HIGH (ref 8–23)
CO2: 29 mmol/L (ref 22–32)
Calcium: 10 mg/dL (ref 8.9–10.3)
Chloride: 102 mmol/L (ref 98–111)
Creatinine: 1.5 mg/dL — ABNORMAL HIGH (ref 0.61–1.24)
GFR, Estimated: 46 mL/min — ABNORMAL LOW (ref >60)
Glucose, Bld: 102 mg/dL — ABNORMAL HIGH (ref 70–99)
Potassium: 3.6 mmol/L (ref 3.5–5.1)
Sodium: 138 mmol/L (ref 135–145)
Total Bilirubin: 0.8 mg/dL (ref 0.3–1.2)
Total Protein: 5.9 g/dL — ABNORMAL LOW (ref 6.5–8.1)

## 2020-07-23 LAB — CBC WITH DIFFERENTIAL (CANCER CENTER ONLY)
Abs Immature Granulocytes: 0.01 10*3/uL (ref 0.00–0.07)
Basophils Absolute: 0 10*3/uL (ref 0.0–0.1)
Basophils Relative: 1 %
Eosinophils Absolute: 0.3 10*3/uL (ref 0.0–0.5)
Eosinophils Relative: 5 %
HCT: 32.9 % — ABNORMAL LOW (ref 39.0–52.0)
Hemoglobin: 11.3 g/dL — ABNORMAL LOW (ref 13.0–17.0)
Immature Granulocytes: 0 %
Lymphocytes Relative: 32 %
Lymphs Abs: 2 10*3/uL (ref 0.7–4.0)
MCH: 32.8 pg (ref 26.0–34.0)
MCHC: 34.3 g/dL (ref 30.0–36.0)
MCV: 95.6 fL (ref 80.0–100.0)
Monocytes Absolute: 0.8 10*3/uL (ref 0.1–1.0)
Monocytes Relative: 12 %
Neutro Abs: 3.1 10*3/uL (ref 1.7–7.7)
Neutrophils Relative %: 50 %
Platelet Count: 192 10*3/uL (ref 150–400)
RBC: 3.44 MIL/uL — ABNORMAL LOW (ref 4.22–5.81)
RDW: 12.9 % (ref 11.5–15.5)
WBC Count: 6.1 10*3/uL (ref 4.0–10.5)
nRBC: 0 % (ref 0.0–0.2)

## 2020-07-23 LAB — LACTATE DEHYDROGENASE: LDH: 187 U/L (ref 98–192)

## 2020-07-23 MED ORDER — ACETAMINOPHEN 325 MG PO TABS
650.0000 mg | ORAL_TABLET | Freq: Once | ORAL | Status: AC
  Administered 2020-07-23: 650 mg via ORAL

## 2020-07-23 MED ORDER — DIPHENHYDRAMINE HCL 25 MG PO CAPS
50.0000 mg | ORAL_CAPSULE | Freq: Once | ORAL | Status: AC
  Administered 2020-07-23: 50 mg via ORAL

## 2020-07-23 MED ORDER — AMOXICILLIN 500 MG PO CAPS: 2000.0000 mg | ORAL_CAPSULE | Freq: Once | ORAL | 6 refills | Status: AC

## 2020-07-23 MED ORDER — ACETAMINOPHEN 325 MG PO TABS
ORAL_TABLET | ORAL | Status: AC
  Filled 2020-07-23: qty 2

## 2020-07-23 MED ORDER — DARATUMUMAB-HYALURONIDASE-FIHJ 1800-30000 MG-UT/15ML ~~LOC~~ SOLN
1800.0000 mg | Freq: Once | SUBCUTANEOUS | Status: AC
  Administered 2020-07-23: 1800 mg via SUBCUTANEOUS
  Filled 2020-07-23: qty 15

## 2020-07-23 MED ORDER — HEPARIN SOD (PORK) LOCK FLUSH 100 UNIT/ML IV SOLN
500.0000 [IU] | Freq: Once | INTRAVENOUS | Status: AC | PRN
  Administered 2020-07-23: 500 [IU]
  Filled 2020-07-23: qty 5

## 2020-07-23 MED ORDER — DIPHENHYDRAMINE HCL 25 MG PO CAPS
ORAL_CAPSULE | ORAL | Status: AC
  Filled 2020-07-23: qty 2

## 2020-07-23 MED ORDER — SODIUM CHLORIDE 0.9% FLUSH
10.0000 mL | INTRAVENOUS | Status: DC | PRN
  Administered 2020-07-23: 10 mL
  Filled 2020-07-23: qty 10

## 2020-07-23 MED ORDER — DEXAMETHASONE 4 MG PO TABS
20.0000 mg | ORAL_TABLET | Freq: Once | ORAL | Status: AC
  Administered 2020-07-23: 20 mg via ORAL

## 2020-07-23 MED ORDER — DEXAMETHASONE 4 MG PO TABS
ORAL_TABLET | ORAL | Status: AC
  Filled 2020-07-23: qty 5

## 2020-07-23 MED ORDER — DIPHENHYDRAMINE HCL 25 MG PO CAPS
ORAL_CAPSULE | ORAL | Status: AC
  Filled 2020-07-23: qty 1

## 2020-07-23 MED FILL — AMOXICILLIN 500 MG CAPSULE: 500 | 1 days supply | Qty: 4 | Fill #0

## 2020-07-23 NOTE — Telephone Encounter (Signed)
3/29 appts already in place, 09/07/20 appts added due to length of appts, pt to rec. sch in tx/avs    Audi Wettstein

## 2020-07-23 NOTE — Progress Notes (Signed)
Hematology and Oncology Follow Up Visit  Robert [name] [id number] [birth year] 87 y.o. 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 Aranesp 300 mcg subcu q. 3 weeks for hemoglobin less than 10   Interim History:  [name] is here today for follow-up and treatment.  He really has had no complaints since we last saw him.  He is still not able to lose weight.  I'm not sure how much she really is exercising.  He still is having dental work done.  I think he goes back on 23 March.  I did refill the amoxicillin.  He knows to take for amoxicillin pills in an hour before he has his dental work.  There has been no evidence of myeloma.  There is no monoclonal spike in his blood back in February.  His IgG level was 500 mg/dL.  The Kappa light chain was 1.0 mg/dL.  His appetite has been good.  He has had no problems with nausea or vomiting.  There has been no change in bowel or bladder habits.  He has had no rashes.  There has been no leg swelling.  He is trying to watch his blood pressure.  Overall, his performance status is ECOG 1.    Medications:  Allergies as of 07/23/2020   No Known Allergies     Medication List       Accurate as of July 23, 2020  9:03 AM. If you have any questions, ask your nurse or doctor.        amoxicillin 500 MG capsule Commonly known as: AMOXIL Take 500 mg by mouth 3 (three) times daily.   aspirin 81 MG tablet Take 81 mg by mouth daily.   bimatoprost 0.01 % Soln Commonly known as: LUMIGAN Place 1 drop into both eyes at  bedtime.   CALTRATE 600+D PLUS PO Take 1 tablet by mouth daily.   ciclopirox 0.77 % cream Commonly known as: LOPROX APPLY CREAM TOPICALLY ONCE DAILY   cloNIDine 0.1 MG tablet Commonly known as: CATAPRES Take 0.1 mg by mouth 2 (two) times daily as needed.   cyanocobalamin 2000 MCG tablet Take 2,000 mcg by mouth at bedtime.   famciclovir 250 MG tablet Commonly known as: FAMVIR TAKE 1 TABLET DAILY   furosemide 20 MG tablet Commonly known as: LASIX Take 2 tablets (40 mg total) by mouth daily. What changed:   when to take this  reasons to take this   losartan 100 MG tablet Commonly known as: COZAAR Take 100 mg by mouth daily.   metolazone 5 MG tablet Commonly known as: ZAROXOLYN TAKE 1 TABLET 1 HOUR PRIOR TO TAKING LASIX DAILY   montelukast 10 MG tablet Commonly known as: Singulair Take 1 tablet (10 mg total) by mouth at bedtime.   multivitamin with minerals Tabs tablet Take 1 tablet by mouth daily.   potassium chloride SA 20 MEQ tablet Commonly known as: KLOR-CON Take 20 mEq by mouth daily.   rosuvastatin 20 MG tablet Commonly known as: CRESTOR Take 20 mg by mouth at  bedtime.   SYSTANE OP Place 1 drop into both eyes daily as needed (dry eyes).   Vitamin D3 25 MCG (1000 UT) Caps Take 1,000 Units by mouth daily.       Allergies: No Known Allergies  Past Medical History, Surgical history, Social history, and Family History were reviewed and updated.  Review of Systems: Review of Systems  Constitutional: Negative.   HENT: Negative.   Eyes: Negative.   Respiratory: Negative.   Cardiovascular: Negative.   Gastrointestinal: Negative.   Genitourinary: Negative.   Musculoskeletal: Negative.   Skin: Negative.   Neurological: Negative.   Endo/Heme/Allergies: Negative.   Psychiatric/Behavioral: Negative.       Physical Exam:  weight is 219 lb (99.3 kg). His oral temperature is 98.5 F (36.9 C). His blood pressure is 136/70 and his pulse is 69. His  respiration is 18 and oxygen saturation is 99%.   Wt Readings from Last 3 Encounters:  07/23/20 219 lb (99.3 kg)  06/26/20 217 lb (98.4 kg)  05/30/20 220 lb (99.8 kg)    Physical Exam Vitals reviewed.  HENT:     Head: Normocephalic and atraumatic.  Eyes:     Pupils: Pupils are equal, round, and reactive to light.  Cardiovascular:     Rate and Rhythm: Normal rate and regular rhythm.     Heart sounds: Normal heart sounds.  Pulmonary:     Effort: Pulmonary effort is normal.     Breath sounds: Normal breath sounds.  Abdominal:     General: Bowel sounds are normal.     Palpations: Abdomen is soft.  Musculoskeletal:        General: No tenderness or deformity. Normal range of motion.     Cervical back: Normal range of motion.  Lymphadenopathy:     Cervical: No cervical adenopathy.  Skin:    General: Skin is warm and dry.     Findings: No erythema or rash.  Neurological:     Mental Status: He is alert and oriented to person, place, and time.  Psychiatric:        Behavior: Behavior normal.        Thought Content: Thought content normal.        Judgment: Judgment normal.      Lab Results  Component Value Date   WBC 6.1 07/23/2020   HGB 11.3 (L) 07/23/2020   HCT 32.9 (L) 07/23/2020   MCV 95.6 07/23/2020   PLT 192 07/23/2020   Lab Results  Component Value Date   FERRITIN 1,061 (H) 06/28/2019   IRON 82 06/28/2019   TIBC 281 06/28/2019   UIBC 200 06/28/2019   IRONPCTSAT 29 06/28/2019   Lab Results  Component Value Date   RETICCTPCT 2.2 04/05/2019   RBC 3.44 (L) 07/23/2020   Lab Results  Component Value Date   KPAFRELGTCHN 10.1 06/26/2020   LAMBDASER 4.6 (L) 06/26/2020   KAPLAMBRATIO 2.20 (H) 06/26/2020   Lab Results  Component Value Date   IGGSERUM 502 (L) 06/26/2020   IGA 21 (L) 06/26/2020   IGMSERUM 28 06/26/2020   Lab Results  Component Value Date   TOTALPROTELP 5.9 (L) 06/26/2020   ALBUMINELP 3.5 06/26/2020   A1GS 0.2 06/26/2020   A2GS 0.8  06/26/2020   BETS 0.9 06/26/2020   BETA2SER 0.3 01/03/2015   GAMS 0.5 06/26/2020   MSPIKE Not Observed 06/26/2020   SPEI Comment 06/26/2020     Chemistry      Component Value Date/Time   NA 138 07/23/2020 0820  NA 137 04/08/2017 1141   NA 138 10/29/2015 1029   K 3.6 07/23/2020 0820   K 3.4 04/08/2017 1141   K 3.9 10/29/2015 1029   CL 102 07/23/2020 0820   CL 102 04/08/2017 1141   CO2 29 07/23/2020 0820   CO2 30 04/08/2017 1141   CO2 27 10/29/2015 1029   BUN 26 (H) 07/23/2020 0820   BUN 16 04/08/2017 1141   BUN 13.8 10/29/2015 1029   CREATININE 1.50 (H) 07/23/2020 0820   CREATININE 1.3 (H) 04/08/2017 1141   CREATININE 1.1 10/29/2015 1029      Component Value Date/Time   CALCIUM 10.0 07/23/2020 0820   CALCIUM 9.3 04/08/2017 1141   CALCIUM 9.3 10/29/2015 1029   ALKPHOS 52 07/23/2020 0820   ALKPHOS 66 04/08/2017 1141   ALKPHOS 52 10/29/2015 1029   AST 16 07/23/2020 0820   AST 20 10/29/2015 1029   ALT 12 07/23/2020 0820   ALT 21 04/08/2017 1141   ALT 14 10/29/2015 1029   BILITOT 0.8 07/23/2020 0820   BILITOT 0.75 10/29/2015 1029       Impression and Plan: [name] is a very pleasant 87 yo African American gentleman with relapsed IgG kappa myeloma. He has history of stem cell transplant in 2006.   He has tolerated Daratumumab well and has had a nice response.  He still is in remission.  I am just happy that his quality life is doing well.  I really just wish he would lose weight.  We are holding his Zometa for right now.  I probably think we can restart this in May.  We will get him back in 1 more month.   Volanda Napoleon, MD 3/1/20229:03 AM

## 2020-07-23 NOTE — Progress Notes (Signed)
Pt. Refused to wait one hour post injection. Stable and ASX upon discharge.

## 2020-07-23 NOTE — Patient Instructions (Signed)
Daratumumab injection What is this medicine? DARATUMUMAB (dar a toom ue mab) is a monoclonal antibody. It is used to treat multiple myeloma. This medicine may be used for other purposes; ask your health care provider or pharmacist if you have questions. COMMON BRAND NAME(S): DARZALEX What should I tell my health care provider before I take this medicine? They need to know if you have any of these conditions:  hereditary fructose intolerance  infection (especially a virus infection such as chickenpox, herpes, or hepatitis B virus)  lung or breathing disease (asthma, COPD)  an unusual or allergic reaction to daratumumab, sorbitol, other medicines, foods, dyes, or preservatives  pregnant or trying to get pregnant  breast-feeding How should I use this medicine? This medicine is for infusion into a vein. It is given by a health care professional in a hospital or clinic setting. Talk to your pediatrician regarding the use of this medicine in children. Special care may be needed. Overdosage: If you think you have taken too much of this medicine contact a poison control center or emergency room at once. NOTE: This medicine is only for you. Do not share this medicine with others. What if I miss a dose? Keep appointments for follow-up doses as directed. It is important not to miss your dose. Call your doctor or health care professional if you are unable to keep an appointment. What may interact with this medicine? Interactions have not been studied. This list may not describe all possible interactions. Give your health care provider a list of all the medicines, herbs, non-prescription drugs, or dietary supplements you use. Also tell them if you smoke, drink alcohol, or use illegal drugs. Some items may interact with your medicine. What should I watch for while using this medicine? Your condition will be monitored carefully while you are receiving this medicine. This medicine can cause serious  allergic reactions. To reduce your risk, your health care provider may give you other medicine to take before receiving this one. Be sure to follow the directions from your health care provider. This medicine can affect the results of blood tests to match your blood type. These changes can last for up to 6 months after the final dose. Your healthcare provider will do blood tests to match your blood type before you start treatment. Tell all of your healthcare providers that you are being treated with this medicine before receiving a blood transfusion. This medicine can affect the results of some tests used to determine treatment response; extra tests may be needed to evaluate response. Do not become pregnant while taking this medicine or for 3 months after stopping it. Women should inform their health care provider if they wish to become pregnant or think they might be pregnant. There is a potential for serious side effects to an unborn child. Talk to your health care provider for more information. Do not breast-feed an infant while taking this medicine. What side effects may I notice from receiving this medicine? Side effects that you should report to your doctor or health care professional as soon as possible:  allergic reactions (skin rash; itching or hives; swelling of the face, lips, or tongue)  infection (fever, chills, cough, sore throat, pain or difficulty passing urine)  infusion reaction (dizziness, fast heartbeat, feeling faint or lightheaded, falls, headache, increase in blood pressure, nausea, vomiting, or wheezing or trouble breathing with loud or whistling sounds)  unusual bleeding or bruising Side effects that usually do not require medical attention (report to your doctor  or health care professional if they continue or are bothersome):  constipation  diarrhea  pain, tingling, numbness in the hands or feet  swelling of the ankles, feet, hands  tiredness This list may not  describe all possible side effects. Call your doctor for medical advice about side effects. You may report side effects to FDA at 1-800-FDA-1088. Where should I keep my medicine? This drug is given in a hospital or clinic and will not be stored at home. NOTE: This sheet is a summary. It may not cover all possible information. If you have questions about this medicine, talk to your doctor, pharmacist, or health care provider.  2021 Elsevier/Gold Standard (2020-05-02 13:28:52)

## 2020-07-24 LAB — KAPPA/LAMBDA LIGHT CHAINS
Kappa free light chain: 11.3 mg/L (ref 3.3–19.4)
Kappa, lambda light chain ratio: 1.69 — ABNORMAL HIGH (ref 0.26–1.65)
Lambda free light chains: 6.7 mg/L (ref 5.7–26.3)

## 2020-07-24 LAB — IGG, IGA, IGM
IgA: 21 mg/dL — ABNORMAL LOW (ref 61–437)
IgG (Immunoglobin G), Serum: 486 mg/dL — ABNORMAL LOW (ref 603–1613)
IgM (Immunoglobulin M), Srm: 27 mg/dL (ref 15–143)

## 2020-07-25 LAB — PROTEIN ELECTROPHORESIS, SERUM, WITH REFLEX
A/G Ratio: 1.4 (ref 0.7–1.7)
Albumin ELP: 3.3 g/dL (ref 2.9–4.4)
Alpha-1-Globulin: 0.2 g/dL (ref 0.0–0.4)
Alpha-2-Globulin: 0.8 g/dL (ref 0.4–1.0)
Beta Globulin: 0.8 g/dL (ref 0.7–1.3)
Gamma Globulin: 0.5 g/dL (ref 0.4–1.8)
Globulin, Total: 2.3 g/dL (ref 2.2–3.9)
Total Protein ELP: 5.6 g/dL — ABNORMAL LOW (ref 6.0–8.5)

## 2020-08-20 ENCOUNTER — Inpatient Hospital Stay: Payer: Medicare Other

## 2020-08-20 ENCOUNTER — Telehealth: Payer: Self-pay

## 2020-08-20 ENCOUNTER — Other Ambulatory Visit: Payer: Self-pay

## 2020-08-20 ENCOUNTER — Inpatient Hospital Stay (HOSPITAL_BASED_OUTPATIENT_CLINIC_OR_DEPARTMENT_OTHER): Payer: Medicare Other | Admitting: Hematology & Oncology

## 2020-08-20 VITALS — BP 151/73 | HR 69 | Temp 97.8°F | Resp 17 | Wt 218.1 lb

## 2020-08-20 VITALS — BP 155/83 | HR 74 | Resp 18

## 2020-08-20 DIAGNOSIS — C9001 Multiple myeloma in remission: Secondary | ICD-10-CM

## 2020-08-20 DIAGNOSIS — C9000 Multiple myeloma not having achieved remission: Secondary | ICD-10-CM

## 2020-08-20 DIAGNOSIS — Z5112 Encounter for antineoplastic immunotherapy: Secondary | ICD-10-CM | POA: Diagnosis not present

## 2020-08-20 LAB — CMP (CANCER CENTER ONLY)
ALT: 11 U/L (ref 0–44)
AST: 16 U/L (ref 15–41)
Albumin: 4 g/dL (ref 3.5–5.0)
Alkaline Phosphatase: 48 U/L (ref 38–126)
Anion gap: 6 (ref 5–15)
BUN: 24 mg/dL — ABNORMAL HIGH (ref 8–23)
CO2: 30 mmol/L (ref 22–32)
Calcium: 9.9 mg/dL (ref 8.9–10.3)
Chloride: 101 mmol/L (ref 98–111)
Creatinine: 1.24 mg/dL (ref 0.61–1.24)
GFR, Estimated: 57 mL/min — ABNORMAL LOW (ref >60)
Glucose, Bld: 92 mg/dL (ref 70–99)
Potassium: 3.5 mmol/L (ref 3.5–5.1)
Sodium: 137 mmol/L (ref 135–145)
Total Bilirubin: 0.7 mg/dL (ref 0.3–1.2)
Total Protein: 6 g/dL — ABNORMAL LOW (ref 6.5–8.1)

## 2020-08-20 LAB — CBC WITH DIFFERENTIAL (CANCER CENTER ONLY)
Abs Immature Granulocytes: 0.01 10*3/uL (ref 0.00–0.07)
Basophils Absolute: 0 10*3/uL (ref 0.0–0.1)
Basophils Relative: 1 %
Eosinophils Absolute: 0.3 10*3/uL (ref 0.0–0.5)
Eosinophils Relative: 4 %
HCT: 33.4 % — ABNORMAL LOW (ref 39.0–52.0)
Hemoglobin: 11.3 g/dL — ABNORMAL LOW (ref 13.0–17.0)
Immature Granulocytes: 0 %
Lymphocytes Relative: 37 %
Lymphs Abs: 2.1 10*3/uL (ref 0.7–4.0)
MCH: 32.2 pg (ref 26.0–34.0)
MCHC: 33.8 g/dL (ref 30.0–36.0)
MCV: 95.2 fL (ref 80.0–100.0)
Monocytes Absolute: 0.8 10*3/uL (ref 0.1–1.0)
Monocytes Relative: 14 %
Neutro Abs: 2.5 10*3/uL (ref 1.7–7.7)
Neutrophils Relative %: 44 %
Platelet Count: 192 10*3/uL (ref 150–400)
RBC: 3.51 MIL/uL — ABNORMAL LOW (ref 4.22–5.81)
RDW: 13 % (ref 11.5–15.5)
WBC Count: 5.7 10*3/uL (ref 4.0–10.5)
nRBC: 0 % (ref 0.0–0.2)

## 2020-08-20 LAB — LACTATE DEHYDROGENASE: LDH: 183 U/L (ref 98–192)

## 2020-08-20 MED ORDER — HEPARIN SOD (PORK) LOCK FLUSH 100 UNIT/ML IV SOLN
500.0000 [IU] | Freq: Once | INTRAVENOUS | Status: AC | PRN
  Administered 2020-08-20: 500 [IU]
  Filled 2020-08-20: qty 5

## 2020-08-20 MED ORDER — DIPHENHYDRAMINE HCL 25 MG PO CAPS
ORAL_CAPSULE | ORAL | Status: AC
  Filled 2020-08-20: qty 2

## 2020-08-20 MED ORDER — ACETAMINOPHEN 325 MG PO TABS
ORAL_TABLET | ORAL | Status: AC
  Filled 2020-08-20: qty 2

## 2020-08-20 MED ORDER — DARATUMUMAB-HYALURONIDASE-FIHJ 1800-30000 MG-UT/15ML ~~LOC~~ SOLN
1800.0000 mg | Freq: Once | SUBCUTANEOUS | Status: AC
  Administered 2020-08-20: 1800 mg via SUBCUTANEOUS
  Filled 2020-08-20: qty 15

## 2020-08-20 MED ORDER — ACETAMINOPHEN 325 MG PO TABS
650.0000 mg | ORAL_TABLET | Freq: Once | ORAL | Status: AC
  Administered 2020-08-20: 650 mg via ORAL

## 2020-08-20 MED ORDER — SODIUM CHLORIDE 0.9% FLUSH
10.0000 mL | INTRAVENOUS | Status: DC | PRN
  Administered 2020-08-20: 10 mL
  Filled 2020-08-20: qty 10

## 2020-08-20 MED ORDER — SODIUM CHLORIDE 0.9 % IV SOLN
Freq: Once | INTRAVENOUS | Status: DC
  Filled 2020-08-20: qty 250

## 2020-08-20 MED ORDER — DEXAMETHASONE 4 MG PO TABS
20.0000 mg | ORAL_TABLET | Freq: Once | ORAL | Status: AC
Start: 2020-08-20 — End: 2020-08-20
  Administered 2020-08-20: 20 mg via ORAL

## 2020-08-20 MED ORDER — DIPHENHYDRAMINE HCL 25 MG PO CAPS
50.0000 mg | ORAL_CAPSULE | Freq: Once | ORAL | Status: AC
  Administered 2020-08-20: 50 mg via ORAL

## 2020-08-20 MED ORDER — DEXAMETHASONE 4 MG PO TABS
ORAL_TABLET | ORAL | Status: AC
  Filled 2020-08-20: qty 5

## 2020-08-20 MED FILL — AMOXICILLIN 500 MG CAPSULE: 500 | 1 days supply | Qty: 4 | Fill #1

## 2020-08-20 NOTE — Progress Notes (Signed)
Hematology and Oncology Follow Up Visit  Robert [name] [id number] 1933-07-09 87 y.o. 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 Aranesp 300 mcg subcu q. 3 weeks for hemoglobin less than 10   Interim History:  [name] is here today for follow-up and treatment.  He seems to be doing pretty well.  He really has had no complaints.  He is trying to workout exercise little bit more.  He still is dealing with the root canal issues.  He will will get the final crown I think in April.  As such, we can probably restart his Zometa in May.  He has had no fever.  He has had no issues with nausea or vomiting.  Is been no change in bowel or bladder habits.  He does go to [employer] where he has a house.  He might be going there in a couple weeks.  Thankfully, there is still no problems with respect to his myeloma.  There is no monoclonal spike that we found on him back in early March.  His IgG level was 486 mg/dL.  The Kappa light chain was 1.1 mg/dL.  Overall, his performance status is ECOG 1.    Medications:  Allergies as of 08/20/2020   No Known Allergies     Medication List       Accurate as of August 20, 2020  8:58 AM. If you have any questions, ask your nurse or doctor.        aspirin 81 MG tablet Take 81 mg by mouth daily.   bimatoprost 0.01 % Soln Commonly known as: LUMIGAN Place 1 drop into both eyes at bedtime.   CALTRATE 600+D PLUS PO Take 1 tablet by mouth daily.   ciclopirox 0.77 % cream Commonly known as: LOPROX APPLY CREAM TOPICALLY  ONCE DAILY   cloNIDine 0.1 MG tablet Commonly known as: CATAPRES Take 0.1 mg by mouth 2 (two) times daily as needed.   cyanocobalamin 2000 MCG tablet Take 2,000 mcg by mouth at bedtime.   famciclovir 250 MG tablet Commonly known as: FAMVIR TAKE 1 TABLET DAILY   furosemide 20 MG tablet Commonly known as: LASIX Take 2 tablets (40 mg total) by mouth daily.   losartan 100 MG tablet Commonly known as: COZAAR Take 100 mg by mouth daily.   metolazone 5 MG tablet Commonly known as: ZAROXOLYN TAKE 1 TABLET 1 HOUR PRIOR TO TAKING LASIX DAILY   montelukast 10 MG tablet Commonly known as: Singulair Take 1 tablet (10 mg total) by mouth at bedtime.   multivitamin with minerals Tabs tablet Take 1 tablet by mouth daily.   potassium chloride SA 20 MEQ tablet Commonly known as: KLOR-CON Take 20 mEq by mouth daily.   rosuvastatin 20 MG tablet Commonly known as: CRESTOR Take 20 mg by mouth at bedtime.   SYSTANE OP Place 1 drop into both eyes daily as needed (dry eyes).   Vitamin D3 25 MCG (1000 UT) Caps Take 1,000 Units by mouth daily.       Allergies: No Known  Allergies  Past Medical History, Surgical history, Social history, and Family History were reviewed and updated.  Review of Systems: Review of Systems  Constitutional: Negative.   HENT: Negative.   Eyes: Negative.   Respiratory: Negative.   Cardiovascular: Negative.   Gastrointestinal: Negative.   Genitourinary: Negative.   Musculoskeletal: Negative.   Skin: Negative.   Neurological: Negative.   Endo/Heme/Allergies: Negative.   Psychiatric/Behavioral: Negative.       Physical Exam:  weight is 218 lb 1.9 oz (98.9 kg). His oral temperature is 97.8 F (36.6 C). His blood pressure is 151/73 (abnormal) and his pulse is 69. His respiration is 17 and oxygen saturation is 100%.   Wt Readings from Last 3 Encounters:  08/20/20 218 lb 1.9 oz (98.9 kg)  07/23/20 219 lb (99.3 kg)  06/26/20 217 lb (98.4 kg)     Physical Exam Vitals reviewed.  HENT:     Head: Normocephalic and atraumatic.  Eyes:     Pupils: Pupils are equal, round, and reactive to light.  Cardiovascular:     Rate and Rhythm: Normal rate and regular rhythm.     Heart sounds: Normal heart sounds.  Pulmonary:     Effort: Pulmonary effort is normal.     Breath sounds: Normal breath sounds.  Abdominal:     General: Bowel sounds are normal.     Palpations: Abdomen is soft.  Musculoskeletal:        General: No tenderness or deformity. Normal range of motion.     Cervical back: Normal range of motion.  Lymphadenopathy:     Cervical: No cervical adenopathy.  Skin:    General: Skin is warm and dry.     Findings: No erythema or rash.  Neurological:     Mental Status: He is alert and oriented to person, place, and time.  Psychiatric:        Behavior: Behavior normal.        Thought Content: Thought content normal.        Judgment: Judgment normal.      Lab Results  Component Value Date   WBC 5.7 08/20/2020   HGB 11.3 (L) 08/20/2020   HCT 33.4 (L) 08/20/2020   MCV 95.2 08/20/2020   PLT 192 08/20/2020   Lab Results  Component Value Date   FERRITIN 1,061 (H) 06/28/2019   IRON 82 06/28/2019   TIBC 281 06/28/2019   UIBC 200 06/28/2019   IRONPCTSAT 29 06/28/2019   Lab Results  Component Value Date   RETICCTPCT 2.2 04/05/2019   RBC 3.51 (L) 08/20/2020   Lab Results  Component Value Date   KPAFRELGTCHN 11.3 07/23/2020   LAMBDASER 6.7 07/23/2020   KAPLAMBRATIO 1.69 (H) 07/23/2020   Lab Results  Component Value Date   IGGSERUM 486 (L) 07/23/2020   IGA 21 (L) 07/23/2020   IGMSERUM 27 07/23/2020   Lab Results  Component Value Date   TOTALPROTELP 5.6 (L) 07/23/2020   ALBUMINELP 3.3 07/23/2020   A1GS 0.2 07/23/2020   A2GS 0.8 07/23/2020   BETS 0.8 07/23/2020   BETA2SER 0.3 01/03/2015   GAMS 0.5 07/23/2020   MSPIKE Not Observed 07/23/2020   SPEI Comment 06/26/2020     Chemistry      Component Value  Date/Time   NA 137 08/20/2020 0815   NA 137 04/08/2017 1141   NA 138 10/29/2015 1029   K 3.5 08/20/2020 0815   K 3.4 04/08/2017 1141   K 3.9 10/29/2015 1029   CL 101 08/20/2020 0815  CL 102 04/08/2017 1141   CO2 30 08/20/2020 0815   CO2 30 04/08/2017 1141   CO2 27 10/29/2015 1029   BUN 24 (H) 08/20/2020 0815   BUN 16 04/08/2017 1141   BUN 13.8 10/29/2015 1029   CREATININE 1.24 08/20/2020 0815   CREATININE 1.3 (H) 04/08/2017 1141   CREATININE 1.1 10/29/2015 1029      Component Value Date/Time   CALCIUM 9.9 08/20/2020 0815   CALCIUM 9.3 04/08/2017 1141   CALCIUM 9.3 10/29/2015 1029   ALKPHOS 48 08/20/2020 0815   ALKPHOS 66 04/08/2017 1141   ALKPHOS 52 10/29/2015 1029   AST 16 08/20/2020 0815   AST 20 10/29/2015 1029   ALT 11 08/20/2020 0815   ALT 21 04/08/2017 1141   ALT 14 10/29/2015 1029   BILITOT 0.7 08/20/2020 0815   BILITOT 0.75 10/29/2015 1029       Impression and Plan: Mr. Gaby is a very pleasant 87 yo African American gentleman with relapsed IgG kappa myeloma. He has history of stem cell transplant in 2006.   He has tolerated Daratumumab well and has had a nice response.  He still is in remission.  I am just happy that his quality life is doing well.  I really just wish he would lose weight.  We are holding his Zometa for right now.  I probably think we can restart this in May.  We will get him back in 1 more month.   Volanda Napoleon, MD 3/29/20228:58 AM

## 2020-08-20 NOTE — Telephone Encounter (Signed)
Per 08/20/20 los next appts have already been sch, 10/15/20 has been added and pt will see in tx/avs   Smita Lesh

## 2020-08-20 NOTE — Patient Instructions (Signed)

## 2020-08-20 NOTE — Patient Instructions (Signed)
Daratumumab injection What is this medicine? DARATUMUMAB (dar a toom ue mab) is a monoclonal antibody. It is used to treat multiple myeloma. This medicine may be used for other purposes; ask your health care provider or pharmacist if you have questions. COMMON BRAND NAME(S): DARZALEX What should I tell my health care provider before I take this medicine? They need to know if you have any of these conditions:  hereditary fructose intolerance  infection (especially a virus infection such as chickenpox, herpes, or hepatitis B virus)  lung or breathing disease (asthma, COPD)  an unusual or allergic reaction to daratumumab, sorbitol, other medicines, foods, dyes, or preservatives  pregnant or trying to get pregnant  breast-feeding How should I use this medicine? This medicine is for infusion into a vein. It is given by a health care professional in a hospital or clinic setting. Talk to your pediatrician regarding the use of this medicine in children. Special care may be needed. Overdosage: If you think you have taken too much of this medicine contact a poison control center or emergency room at once. NOTE: This medicine is only for you. Do not share this medicine with others. What if I miss a dose? Keep appointments for follow-up doses as directed. It is important not to miss your dose. Call your doctor or health care professional if you are unable to keep an appointment. What may interact with this medicine? Interactions have not been studied. This list may not describe all possible interactions. Give your health care provider a list of all the medicines, herbs, non-prescription drugs, or dietary supplements you use. Also tell them if you smoke, drink alcohol, or use illegal drugs. Some items may interact with your medicine. What should I watch for while using this medicine? Your condition will be monitored carefully while you are receiving this medicine. This medicine can cause serious  allergic reactions. To reduce your risk, your health care provider may give you other medicine to take before receiving this one. Be sure to follow the directions from your health care provider. This medicine can affect the results of blood tests to match your blood type. These changes can last for up to 6 months after the final dose. Your healthcare provider will do blood tests to match your blood type before you start treatment. Tell all of your healthcare providers that you are being treated with this medicine before receiving a blood transfusion. This medicine can affect the results of some tests used to determine treatment response; extra tests may be needed to evaluate response. Do not become pregnant while taking this medicine or for 3 months after stopping it. Women should inform their health care provider if they wish to become pregnant or think they might be pregnant. There is a potential for serious side effects to an unborn child. Talk to your health care provider for more information. Do not breast-feed an infant while taking this medicine. What side effects may I notice from receiving this medicine? Side effects that you should report to your doctor or health care professional as soon as possible:  allergic reactions (skin rash; itching or hives; swelling of the face, lips, or tongue)  infection (fever, chills, cough, sore throat, pain or difficulty passing urine)  infusion reaction (dizziness, fast heartbeat, feeling faint or lightheaded, falls, headache, increase in blood pressure, nausea, vomiting, or wheezing or trouble breathing with loud or whistling sounds)  unusual bleeding or bruising Side effects that usually do not require medical attention (report to your doctor  or health care professional if they continue or are bothersome):  constipation  diarrhea  pain, tingling, numbness in the hands or feet  swelling of the ankles, feet, hands  tiredness This list may not  describe all possible side effects. Call your doctor for medical advice about side effects. You may report side effects to FDA at 1-800-FDA-1088. Where should I keep my medicine? This drug is given in a hospital or clinic and will not be stored at home. NOTE: This sheet is a summary. It may not cover all possible information. If you have questions about this medicine, talk to your doctor, pharmacist, or health care provider.  2021 Elsevier/Gold Standard (2020-05-02 13:28:52)

## 2020-08-20 NOTE — Progress Notes (Signed)
Labs results reviewed by Dr. Marin Olp . VO " ok to treat despite counts"

## 2020-08-21 LAB — IGG, IGA, IGM
IgA: 21 mg/dL — ABNORMAL LOW (ref 61–437)
IgG (Immunoglobin G), Serum: 471 mg/dL — ABNORMAL LOW (ref 603–1613)
IgM (Immunoglobulin M), Srm: 31 mg/dL (ref 15–143)

## 2020-08-21 LAB — KAPPA/LAMBDA LIGHT CHAINS
Kappa free light chain: 10.8 mg/L (ref 3.3–19.4)
Kappa, lambda light chain ratio: 2.3 — ABNORMAL HIGH (ref 0.26–1.65)
Lambda free light chains: 4.7 mg/L — ABNORMAL LOW (ref 5.7–26.3)

## 2020-08-23 LAB — IMMUNOFIXATION REFLEX, SERUM
IgA: 24 mg/dL — ABNORMAL LOW (ref 61–437)
IgG (Immunoglobin G), Serum: 522 mg/dL — ABNORMAL LOW (ref 603–1613)
IgM (Immunoglobulin M), Srm: 33 mg/dL (ref 15–143)

## 2020-08-23 LAB — PROTEIN ELECTROPHORESIS, SERUM, WITH REFLEX
A/G Ratio: 1.7 (ref 0.7–1.7)
Albumin ELP: 3.7 g/dL (ref 2.9–4.4)
Alpha-1-Globulin: 0.2 g/dL (ref 0.0–0.4)
Alpha-2-Globulin: 0.9 g/dL (ref 0.4–1.0)
Beta Globulin: 0.8 g/dL (ref 0.7–1.3)
Gamma Globulin: 0.4 g/dL (ref 0.4–1.8)
Globulin, Total: 2.2 g/dL (ref 2.2–3.9)
M-Spike, %: 0.1 g/dL — ABNORMAL HIGH
SPEP Interpretation: 0
Total Protein ELP: 5.9 g/dL — ABNORMAL LOW (ref 6.0–8.5)

## 2020-09-04 ENCOUNTER — Ambulatory Visit: Payer: Medicare Other | Attending: Internal Medicine

## 2020-09-04 DIAGNOSIS — Z23 Encounter for immunization: Secondary | ICD-10-CM

## 2020-09-04 NOTE — Progress Notes (Signed)
   Covid-19 Vaccination Clinic  Name:  JAHMEZ BILY    MRN: [id number] DOB: [birth year]  09/04/2020  Mr. Stirewalt was observed post Covid-19 immunization for 15 minutes without incident. He was provided with Vaccine Information Sheet and instruction to access the V-Safe system.   Mr. Doeden was instructed to call 911 with any severe reactions post vaccine: Marland Kitchen Difficulty breathing  . Swelling of face and throat  . A fast heartbeat  . A bad rash all over body  . Dizziness and weakness   Immunizations Administered    Name Date Dose VIS Date Route   PFIZER Comrnaty(Gray TOP) Covid-19 Vaccine 09/04/2020 10:21 AM 0.3 mL 05/02/2020 Intramuscular   Manufacturer: [employer], [employer]   Lot: [id number]   NDC: [phone]

## 2020-09-09 ENCOUNTER — Other Ambulatory Visit (HOSPITAL_COMMUNITY): Payer: Self-pay

## 2020-09-09 MED ORDER — COVID-19 MRNA VAC-TRIS(PFIZER) 30 MCG/0.3ML IM SUSP
INTRAMUSCULAR | 0 refills | Status: DC
  Filled 2020-09-09: qty 0.3, 17d supply, fill #0

## 2020-09-10 ENCOUNTER — Other Ambulatory Visit (HOSPITAL_COMMUNITY): Payer: Self-pay

## 2020-09-11 ENCOUNTER — Other Ambulatory Visit (HOSPITAL_COMMUNITY): Payer: Self-pay

## 2020-09-16 ENCOUNTER — Inpatient Hospital Stay: Payer: Medicare Other

## 2020-09-16 ENCOUNTER — Other Ambulatory Visit: Payer: Self-pay

## 2020-09-16 ENCOUNTER — Inpatient Hospital Stay (HOSPITAL_BASED_OUTPATIENT_CLINIC_OR_DEPARTMENT_OTHER): Payer: Medicare Other | Admitting: Hematology & Oncology

## 2020-09-16 ENCOUNTER — Encounter: Payer: Self-pay | Admitting: Hematology & Oncology

## 2020-09-16 ENCOUNTER — Inpatient Hospital Stay: Payer: Medicare Other | Attending: Hematology & Oncology

## 2020-09-16 ENCOUNTER — Other Ambulatory Visit (HOSPITAL_BASED_OUTPATIENT_CLINIC_OR_DEPARTMENT_OTHER): Payer: Self-pay

## 2020-09-16 VITALS — BP 157/74 | HR 66 | Temp 97.7°F | Resp 17 | Wt 217.0 lb

## 2020-09-16 VITALS — BP 155/71 | HR 52

## 2020-09-16 DIAGNOSIS — C9001 Multiple myeloma in remission: Secondary | ICD-10-CM | POA: Diagnosis not present

## 2020-09-16 DIAGNOSIS — C9000 Multiple myeloma not having achieved remission: Secondary | ICD-10-CM

## 2020-09-16 DIAGNOSIS — Z79899 Other long term (current) drug therapy: Secondary | ICD-10-CM | POA: Insufficient documentation

## 2020-09-16 DIAGNOSIS — Z5112 Encounter for antineoplastic immunotherapy: Secondary | ICD-10-CM | POA: Diagnosis not present

## 2020-09-16 DIAGNOSIS — D631 Anemia in chronic kidney disease: Secondary | ICD-10-CM

## 2020-09-16 DIAGNOSIS — C9002 Multiple myeloma in relapse: Secondary | ICD-10-CM | POA: Diagnosis present

## 2020-09-16 DIAGNOSIS — N183 Chronic kidney disease, stage 3 unspecified: Secondary | ICD-10-CM

## 2020-09-16 DIAGNOSIS — M899 Disorder of bone, unspecified: Secondary | ICD-10-CM

## 2020-09-16 LAB — CBC WITH DIFFERENTIAL (CANCER CENTER ONLY)
Abs Immature Granulocytes: 0.01 10*3/uL (ref 0.00–0.07)
Basophils Absolute: 0 10*3/uL (ref 0.0–0.1)
Basophils Relative: 0 %
Eosinophils Absolute: 0.1 10*3/uL (ref 0.0–0.5)
Eosinophils Relative: 2 %
HCT: 32.2 % — ABNORMAL LOW (ref 39.0–52.0)
Hemoglobin: 10.8 g/dL — ABNORMAL LOW (ref 13.0–17.0)
Immature Granulocytes: 0 %
Lymphocytes Relative: 34 %
Lymphs Abs: 2 10*3/uL (ref 0.7–4.0)
MCH: 32 pg (ref 26.0–34.0)
MCHC: 33.5 g/dL (ref 30.0–36.0)
MCV: 95.3 fL (ref 80.0–100.0)
Monocytes Absolute: 0.7 10*3/uL (ref 0.1–1.0)
Monocytes Relative: 12 %
Neutro Abs: 3 10*3/uL (ref 1.7–7.7)
Neutrophils Relative %: 52 %
Platelet Count: 182 10*3/uL (ref 150–400)
RBC: 3.38 MIL/uL — ABNORMAL LOW (ref 4.22–5.81)
RDW: 12.9 % (ref 11.5–15.5)
WBC Count: 5.8 10*3/uL (ref 4.0–10.5)
nRBC: 0 % (ref 0.0–0.2)

## 2020-09-16 LAB — CMP (CANCER CENTER ONLY)
ALT: 13 U/L (ref 0–44)
AST: 15 U/L (ref 15–41)
Albumin: 3.9 g/dL (ref 3.5–5.0)
Alkaline Phosphatase: 46 U/L (ref 38–126)
Anion gap: 8 (ref 5–15)
BUN: 20 mg/dL (ref 8–23)
CO2: 29 mmol/L (ref 22–32)
Calcium: 9.8 mg/dL (ref 8.9–10.3)
Chloride: 102 mmol/L (ref 98–111)
Creatinine: 1.17 mg/dL (ref 0.61–1.24)
GFR, Estimated: >60 mL/min (ref >60)
Glucose, Bld: 111 mg/dL — ABNORMAL HIGH (ref 70–99)
Potassium: 3.3 mmol/L — ABNORMAL LOW (ref 3.5–5.1)
Sodium: 139 mmol/L (ref 135–145)
Total Bilirubin: 1.1 mg/dL (ref 0.3–1.2)
Total Protein: 6 g/dL — ABNORMAL LOW (ref 6.5–8.1)

## 2020-09-16 LAB — LACTATE DEHYDROGENASE: LDH: 187 U/L (ref 98–192)

## 2020-09-16 MED ORDER — HEPARIN SOD (PORK) LOCK FLUSH 100 UNIT/ML IV SOLN
500.0000 [IU] | Freq: Once | INTRAVENOUS | Status: AC | PRN
  Administered 2020-09-16: 500 [IU]
  Filled 2020-09-16: qty 5

## 2020-09-16 MED ORDER — DEXAMETHASONE 4 MG PO TABS
20.0000 mg | ORAL_TABLET | Freq: Once | ORAL | Status: AC
  Administered 2020-09-16: 20 mg via ORAL

## 2020-09-16 MED ORDER — DEXAMETHASONE 4 MG PO TABS
ORAL_TABLET | ORAL | Status: AC
  Filled 2020-09-16: qty 5

## 2020-09-16 MED ORDER — SODIUM CHLORIDE 0.9 % IV SOLN
INTRAVENOUS | Status: DC
  Filled 2020-09-16: qty 250

## 2020-09-16 MED ORDER — ACETAMINOPHEN 325 MG PO TABS
ORAL_TABLET | ORAL | Status: AC
  Filled 2020-09-16: qty 2

## 2020-09-16 MED ORDER — DIPHENHYDRAMINE HCL 25 MG PO CAPS
50.0000 mg | ORAL_CAPSULE | Freq: Once | ORAL | Status: AC
  Administered 2020-09-16: 50 mg via ORAL

## 2020-09-16 MED ORDER — DARATUMUMAB-HYALURONIDASE-FIHJ 1800-30000 MG-UT/15ML ~~LOC~~ SOLN
1800.0000 mg | Freq: Once | SUBCUTANEOUS | Status: AC
  Administered 2020-09-16: 1800 mg via SUBCUTANEOUS
  Filled 2020-09-16: qty 15

## 2020-09-16 MED ORDER — ACETAMINOPHEN 325 MG PO TABS
650.0000 mg | ORAL_TABLET | Freq: Once | ORAL | Status: AC
  Administered 2020-09-16: 650 mg via ORAL

## 2020-09-16 MED ORDER — ZOLEDRONIC ACID 4 MG/100ML IV SOLN
4.0000 mg | Freq: Once | INTRAVENOUS | Status: AC
Start: 2020-09-16 — End: 2020-09-16
  Administered 2020-09-16: 4 mg via INTRAVENOUS
  Filled 2020-09-16: qty 100

## 2020-09-16 MED ORDER — DIPHENHYDRAMINE HCL 25 MG PO CAPS
ORAL_CAPSULE | ORAL | Status: AC
  Filled 2020-09-16: qty 2

## 2020-09-16 MED ORDER — SODIUM CHLORIDE 0.9% FLUSH
10.0000 mL | INTRAVENOUS | Status: DC | PRN
  Administered 2020-09-16: 10 mL
  Filled 2020-09-16: qty 10

## 2020-09-16 MED FILL — Amoxicillin (Trihydrate) Cap 500 MG: ORAL | 28 days supply | Qty: 4 | Fill #0 | Status: AC

## 2020-09-16 NOTE — Progress Notes (Signed)
Hematology and Oncology Follow Up Visit  Robert Robert [name] [id number] [birth year] 87 y.o. 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 Aranesp 300 mcg subcu q. 3 weeks for hemoglobin less than 10   Interim History:  Robert Robert [name] is here today for follow-up and treatment.  He did have a very nice Easter holiday.  His wife actually is going to have cataract surgery tomorrow.  I am sure glad that he is going be able to help her out.  He is worried that his last monoclonal spike was 0.1 g/dL.  This is up a little bit.  Again we have to watch this.  A lot of times we will see a monoclonal spike be present and then it goes away.  He is still trying to exercise.  He still trying to lose a little weight.  He has had no problems with infections.  He has had no problems with bowels or bladder.  He has had no cough or shortness of breath.  His blood pressure seems to be doing pretty well.  Overall, his performance status is ECOG 1.   Medications:  Allergies as of 09/16/2020   No Known Allergies     Medication List       Accurate as of September 16, 2020  9:16 AM. If you have any questions, ask your nurse or doctor.        amoxicillin 500 MG capsule Commonly known as: AMOXIL TAKE 4 CAPSULES (2,000 MG TOTAL) BY MOUTH ONCE FOR 1 DOSE. TAKE 1 HOUR BEFORE DENTAL WORK   aspirin 81 MG tablet Take 81 mg by mouth daily.   bimatoprost 0.01 % Soln Commonly known as: LUMIGAN Place 1 drop into both eyes at bedtime.   CALTRATE 600+D PLUS PO Take 1 tablet by mouth daily.    ciclopirox 0.77 % cream Commonly known as: LOPROX APPLY CREAM TOPICALLY ONCE DAILY   cloNIDine 0.1 MG tablet Commonly known as: CATAPRES Take 0.1 mg by mouth 2 (two) times daily as needed.   cyanocobalamin 2000 MCG tablet Take 2,000 mcg by mouth at bedtime.   famciclovir 250 MG tablet Commonly known as: FAMVIR TAKE 1 TABLET DAILY   furosemide 20 MG tablet Commonly known as: LASIX Take 2 tablets (40 mg total) by mouth daily.   losartan 100 MG tablet Commonly known as: COZAAR Take 100 mg by mouth daily.   metolazone 5 MG tablet Commonly known as: ZAROXOLYN TAKE 1 TABLET 1 HOUR PRIOR TO TAKING LASIX DAILY   montelukast 10 MG tablet Commonly known as: Singulair Take 1 tablet (10 mg total) by mouth at bedtime.   multivitamin with minerals Tabs tablet Take 1 tablet by mouth daily.   Pfizer-BioNT COVID-19 Vac-TriS Susp injection Generic drug: COVID-19 mRNA Vac-TriS (Pfizer) Inject into the muscle.   potassium chloride SA 20 MEQ tablet Commonly known as: KLOR-CON Take 20 mEq by mouth daily.   rosuvastatin 20 MG tablet Commonly known as: CRESTOR Take 20 mg by mouth at bedtime.   SYSTANE OP Place 1 drop into both  eyes daily as needed (dry eyes).   Vitamin D3 25 MCG (1000 UT) Caps Take 1,000 Units by mouth daily.       Allergies: No Known Allergies  Past Medical History, Surgical history, Social history, and Family History were reviewed and updated.  Review of Systems: Review of Systems  Constitutional: Negative.   HENT: Negative.   Eyes: Negative.   Respiratory: Negative.   Cardiovascular: Negative.   Gastrointestinal: Negative.   Genitourinary: Negative.   Musculoskeletal: Negative.   Skin: Negative.   Neurological: Negative.   Endo/Heme/Allergies: Negative.   Psychiatric/Behavioral: Negative.       Physical Exam:  weight is 217 lb (98.4 kg). His oral temperature is 97.7 F (36.5 C). His blood pressure is 157/74 (abnormal) and his pulse is 66.  His respiration is 17 and oxygen saturation is 100%.   Wt Readings from Last 3 Encounters:  09/16/20 217 lb (98.4 kg)  08/20/20 218 lb 1.9 oz (98.9 kg)  07/23/20 219 lb (99.3 kg)    Physical Exam Vitals reviewed.  HENT:     Robert [name]: Normocephalic and atraumatic.  Eyes:     Pupils: Pupils are equal, round, and reactive to light.  Cardiovascular:     Rate and Rhythm: Normal rate and regular rhythm.     Heart sounds: Normal heart sounds.  Pulmonary:     Effort: Pulmonary effort is normal.     Breath sounds: Normal breath sounds.  Abdominal:     General: Bowel sounds are normal.     Palpations: Abdomen is soft.  Musculoskeletal:        General: No tenderness or deformity. Normal range of motion.     Cervical back: Normal range of motion.  Lymphadenopathy:     Cervical: No cervical adenopathy.  Skin:    General: Skin is warm and dry.     Findings: No erythema or rash.  Neurological:     Mental Status: He is alert and oriented to person, place, and time.  Psychiatric:        Behavior: Behavior normal.        Thought Content: Thought content normal.        Judgment: Judgment normal.      Lab Results  Component Value Date   WBC 5.8 09/16/2020   HGB 10.8 (L) 09/16/2020   HCT 32.2 (L) 09/16/2020   MCV 95.3 09/16/2020   PLT 182 09/16/2020   Lab Results  Component Value Date   FERRITIN 1,061 (H) 06/28/2019   IRON 82 06/28/2019   TIBC 281 06/28/2019   UIBC 200 06/28/2019   IRONPCTSAT 29 06/28/2019   Lab Results  Component Value Date   RETICCTPCT 2.2 04/05/2019   RBC 3.38 (L) 09/16/2020   Lab Results  Component Value Date   KPAFRELGTCHN 10.8 08/20/2020   LAMBDASER 4.7 (L) 08/20/2020   KAPLAMBRATIO 2.30 (H) 08/20/2020   Lab Results  Component Value Date   IGGSERUM 471 (L) 08/20/2020   IGGSERUM 522 (L) 08/20/2020   IGA 21 (L) 08/20/2020   IGA 24 (L) 08/20/2020   IGMSERUM 31 08/20/2020   IGMSERUM 33 08/20/2020   Lab Results  Component Value Date    TOTALPROTELP 5.9 (L) 08/20/2020   ALBUMINELP 3.7 08/20/2020   A1GS 0.2 08/20/2020   A2GS 0.9 08/20/2020   BETS 0.8 08/20/2020   BETA2SER 0.3 01/03/2015   GAMS 0.4 08/20/2020   MSPIKE 0.1 (H) 08/20/2020   SPEI Comment 06/26/2020     Chemistry  Component Value Date/Time   NA 139 09/16/2020 0820   NA 137 04/08/2017 1141   NA 138 10/29/2015 1029   K 3.3 (L) 09/16/2020 0820   K 3.4 04/08/2017 1141   K 3.9 10/29/2015 1029   CL 102 09/16/2020 0820   CL 102 04/08/2017 1141   CO2 29 09/16/2020 0820   CO2 30 04/08/2017 1141   CO2 27 10/29/2015 1029   BUN 20 09/16/2020 0820   BUN 16 04/08/2017 1141   BUN 13.8 10/29/2015 1029   CREATININE 1.17 09/16/2020 0820   CREATININE 1.3 (H) 04/08/2017 1141   CREATININE 1.1 10/29/2015 1029      Component Value Date/Time   CALCIUM 9.8 09/16/2020 0820   CALCIUM 9.3 04/08/2017 1141   CALCIUM 9.3 10/29/2015 1029   ALKPHOS 46 09/16/2020 0820   ALKPHOS 66 04/08/2017 1141   ALKPHOS 52 10/29/2015 1029   AST 15 09/16/2020 0820   AST 20 10/29/2015 1029   ALT 13 09/16/2020 0820   ALT 21 04/08/2017 1141   ALT 14 10/29/2015 1029   BILITOT 1.1 09/16/2020 0820   BILITOT 0.75 10/29/2015 1029       Impression and Plan: Robert Robert [name] is a very pleasant 87 yo African American gentleman with relapsed IgG kappa myeloma. He has history of stem cell transplant in 2006.   The monoclonal studies today will be very important.  Hopefully, the monoclonal spike will be 0.  At worst, hopefully it is only 0.1 g/dL.  We will go ahead with his Zometa today.  We held it for a while because of tooth issues.  Hopefully, everything will be okay.  We will still plan to get him back in 1 month.   Volanda Napoleon, MD 4/25/20229:16 AM

## 2020-09-16 NOTE — Patient Instructions (Signed)
Zoledronic Acid Injection (Hypercalcemia, Oncology) What is this medicine? ZOLEDRONIC ACID (ZOE le dron ik AS id) slows calcium loss from bones. It high calcium levels in the blood from some kinds of cancer. It may be used in other people at risk for bone loss. This medicine may be used for other purposes; ask your health care provider or pharmacist if you have questions. COMMON BRAND NAME(S): Zometa What should I tell my health care provider before I take this medicine? They need to know if you have any of these conditions:  cancer  dehydration  dental disease  kidney disease  liver disease  low levels of calcium in the blood  lung or breathing disease (asthma)  receiving steroids like dexamethasone or prednisone  an unusual or allergic reaction to zoledronic acid, other medicines, foods, dyes, or preservatives  pregnant or trying to get pregnant  breast-feeding How should I use this medicine? This drug is injected into a vein. It is given by a health care provider in a hospital or clinic setting. Talk to your health care provider about the use of this drug in children. Special care may be needed. Overdosage: If you think you have taken too much of this medicine contact a poison control center or emergency room at once. NOTE: This medicine is only for you. Do not share this medicine with others. What if I miss a dose? Keep appointments for follow-up doses. It is important not to miss your dose. Call your health care provider if you are unable to keep an appointment. What may interact with this medicine?  certain antibiotics given by injection  NSAIDs, medicines for pain and inflammation, like ibuprofen or naproxen  some diuretics like bumetanide, furosemide  teriparatide  thalidomide This list may not describe all possible interactions. Give your health care provider a list of all the medicines, herbs, non-prescription drugs, or dietary supplements you use. Also tell  them if you smoke, drink alcohol, or use illegal drugs. Some items may interact with your medicine. What should I watch for while using this medicine? Visit your health care provider for regular checks on your progress. It may be some time before you see the benefit from this drug. Some people who take this drug have severe bone, joint, or muscle pain. This drug may also increase your risk for jaw problems or a broken thigh bone. Tell your health care provider right away if you have severe pain in your jaw, bones, joints, or muscles. Tell you health care provider if you have any pain that does not go away or that gets worse. Tell your dentist and dental surgeon that you are taking this drug. You should not have major dental surgery while on this drug. See your dentist to have a dental exam and fix any dental problems before starting this drug. Take good care of your teeth while on this drug. Make sure you see your dentist for regular follow-up appointments. You should make sure you get enough calcium and vitamin D while you are taking this drug. Discuss the foods you eat and the vitamins you take with your health care provider. Check with your health care provider if you have severe diarrhea, nausea, and vomiting, or if you sweat a lot. The loss of too much body fluid may make it dangerous for you to take this drug. You may need blood work done while you are taking this drug. Do not become pregnant while taking this drug. Women should inform their health care provider  if they wish to become pregnant or think they might be pregnant. There is potential for serious harm to an unborn child. Talk to your health care provider for more information. What side effects may I notice from receiving this medicine? Side effects that you should report to your doctor or health care provider as soon as possible:  allergic reactions (skin rash, itching or hives; swelling of the face, lips, or tongue)  bone  pain  infection (fever, chills, cough, sore throat, pain or trouble passing urine)  jaw pain, especially after dental work  joint pain  kidney injury (trouble passing urine or change in the amount of urine)  low blood pressure (dizziness; feeling faint or lightheaded, falls; unusually weak or tired)  low calcium levels (fast heartbeat; muscle cramps or pain; pain, tingling, or numbness in the hands or feet; seizures)  low magnesium levels (fast, irregular heartbeat; muscle cramp or pain; muscle weakness; tremors; seizures)  low red blood cell counts (trouble breathing; feeling faint; lightheaded, falls; unusually weak or tired)  muscle pain  redness, blistering, peeling, or loosening of the skin, including inside the mouth  severe diarrhea  swelling of the ankles, feet, hands  trouble breathing Side effects that usually do not require medical attention (report to your doctor or health care provider if they continue or are bothersome):  anxious  constipation  coughing  depressed mood  eye irritation, itching, or pain  fever  general ill feeling or flu-like symptoms  nausea  pain, redness, or irritation at site where injected  trouble sleeping This list may not describe all possible side effects. Call your doctor for medical advice about side effects. You may report side effects to FDA at 1-800-FDA-1088. Where should I keep my medicine? This drug is given in a hospital or clinic. It will not be stored at home. NOTE: This sheet is a summary. It may not cover all possible information. If you have questions about this medicine, talk to your doctor, pharmacist, or health care provider.  2021 Elsevier/Gold Standard (2019-02-23 09:13:00) Daratumumab injection What is this medicine? DARATUMUMAB (dar a toom ue mab) is a monoclonal antibody. It is used to treat multiple myeloma. This medicine may be used for other purposes; ask your health care provider or pharmacist if  you have questions. COMMON BRAND NAME(S): DARZALEX What should I tell my health care provider before I take this medicine? They need to know if you have any of these conditions:  hereditary fructose intolerance  infection (especially a virus infection such as chickenpox, herpes, or hepatitis B virus)  lung or breathing disease (asthma, COPD)  an unusual or allergic reaction to daratumumab, sorbitol, other medicines, foods, dyes, or preservatives  pregnant or trying to get pregnant  breast-feeding How should I use this medicine? This medicine is for infusion into a vein. It is given by a health care professional in a hospital or clinic setting. Talk to your pediatrician regarding the use of this medicine in children. Special care may be needed. Overdosage: If you think you have taken too much of this medicine contact a poison control center or emergency room at once. NOTE: This medicine is only for you. Do not share this medicine with others. What if I miss a dose? Keep appointments for follow-up doses as directed. It is important not to miss your dose. Call your doctor or health care professional if you are unable to keep an appointment. What may interact with this medicine? Interactions have not been studied. This  list may not describe all possible interactions. Give your health care provider a list of all the medicines, herbs, non-prescription drugs, or dietary supplements you use. Also tell them if you smoke, drink alcohol, or use illegal drugs. Some items may interact with your medicine. What should I watch for while using this medicine? Your condition will be monitored carefully while you are receiving this medicine. This medicine can cause serious allergic reactions. To reduce your risk, your health care provider may give you other medicine to take before receiving this one. Be sure to follow the directions from your health care provider. This medicine can affect the results of  blood tests to match your blood type. These changes can last for up to 6 months after the final dose. Your healthcare provider will do blood tests to match your blood type before you start treatment. Tell all of your healthcare providers that you are being treated with this medicine before receiving a blood transfusion. This medicine can affect the results of some tests used to determine treatment response; extra tests may be needed to evaluate response. Do not become pregnant while taking this medicine or for 3 months after stopping it. Women should inform their health care provider if they wish to become pregnant or think they might be pregnant. There is a potential for serious side effects to an unborn child. Talk to your health care provider for more information. Do not breast-feed an infant while taking this medicine. What side effects may I notice from receiving this medicine? Side effects that you should report to your doctor or health care professional as soon as possible:  allergic reactions (skin rash; itching or hives; swelling of the face, lips, or tongue)  infection (fever, chills, cough, sore throat, pain or difficulty passing urine)  infusion reaction (dizziness, fast heartbeat, feeling faint or lightheaded, falls, headache, increase in blood pressure, nausea, vomiting, or wheezing or trouble breathing with loud or whistling sounds)  unusual bleeding or bruising Side effects that usually do not require medical attention (report to your doctor or health care professional if they continue or are bothersome):  constipation  diarrhea  pain, tingling, numbness in the hands or feet  swelling of the ankles, feet, hands  tiredness This list may not describe all possible side effects. Call your doctor for medical advice about side effects. You may report side effects to FDA at 1-800-FDA-1088. Where should I keep my medicine? This drug is given in a hospital or clinic and will not be  stored at home. NOTE: This sheet is a summary. It may not cover all possible information. If you have questions about this medicine, talk to your doctor, pharmacist, or health care provider.  2021 Elsevier/Gold Standard (2020-05-02 13:28:52)

## 2020-09-16 NOTE — Patient Instructions (Signed)

## 2020-09-17 ENCOUNTER — Other Ambulatory Visit: Payer: Medicare Other

## 2020-09-17 ENCOUNTER — Ambulatory Visit: Payer: Medicare Other | Admitting: Hematology & Oncology

## 2020-09-17 ENCOUNTER — Ambulatory Visit: Payer: Medicare Other

## 2020-09-17 LAB — KAPPA/LAMBDA LIGHT CHAINS
Kappa free light chain: 10.2 mg/L (ref 3.3–19.4)
Kappa, lambda light chain ratio: 1.73 — ABNORMAL HIGH (ref 0.26–1.65)
Lambda free light chains: 5.9 mg/L (ref 5.7–26.3)

## 2020-09-17 LAB — IGG, IGA, IGM
IgA: 19 mg/dL — ABNORMAL LOW (ref 61–437)
IgG (Immunoglobin G), Serum: 484 mg/dL — ABNORMAL LOW (ref 603–1613)
IgM (Immunoglobulin M), Srm: 31 mg/dL (ref 15–143)

## 2020-09-18 LAB — PROTEIN ELECTROPHORESIS, SERUM, WITH REFLEX
A/G Ratio: 1.5 (ref 0.7–1.7)
Albumin ELP: 3.4 g/dL (ref 2.9–4.4)
Alpha-1-Globulin: 0.2 g/dL (ref 0.0–0.4)
Alpha-2-Globulin: 0.8 g/dL (ref 0.4–1.0)
Beta Globulin: 0.9 g/dL (ref 0.7–1.3)
Gamma Globulin: 0.4 g/dL (ref 0.4–1.8)
Globulin, Total: 2.2 g/dL (ref 2.2–3.9)
Total Protein ELP: 5.6 g/dL — ABNORMAL LOW (ref 6.0–8.5)

## 2020-10-15 ENCOUNTER — Telehealth: Payer: Self-pay

## 2020-10-15 ENCOUNTER — Inpatient Hospital Stay: Payer: Medicare Other | Attending: Hematology & Oncology

## 2020-10-15 ENCOUNTER — Other Ambulatory Visit: Payer: Self-pay

## 2020-10-15 ENCOUNTER — Inpatient Hospital Stay: Payer: Medicare Other

## 2020-10-15 ENCOUNTER — Inpatient Hospital Stay (HOSPITAL_BASED_OUTPATIENT_CLINIC_OR_DEPARTMENT_OTHER): Payer: Medicare Other | Admitting: Hematology & Oncology

## 2020-10-15 ENCOUNTER — Encounter: Payer: Self-pay | Admitting: Hematology & Oncology

## 2020-10-15 VITALS — BP 164/81 | HR 60 | Temp 97.8°F | Resp 18 | Wt 221.0 lb

## 2020-10-15 VITALS — BP 163/78 | HR 51 | Resp 18

## 2020-10-15 DIAGNOSIS — C9000 Multiple myeloma not having achieved remission: Secondary | ICD-10-CM

## 2020-10-15 DIAGNOSIS — C9001 Multiple myeloma in remission: Secondary | ICD-10-CM

## 2020-10-15 DIAGNOSIS — Z5112 Encounter for antineoplastic immunotherapy: Secondary | ICD-10-CM | POA: Diagnosis not present

## 2020-10-15 DIAGNOSIS — D630 Anemia in neoplastic disease: Secondary | ICD-10-CM | POA: Insufficient documentation

## 2020-10-15 DIAGNOSIS — Z79899 Other long term (current) drug therapy: Secondary | ICD-10-CM | POA: Insufficient documentation

## 2020-10-15 DIAGNOSIS — C9002 Multiple myeloma in relapse: Secondary | ICD-10-CM | POA: Diagnosis present

## 2020-10-15 LAB — CBC WITH DIFFERENTIAL (CANCER CENTER ONLY)
Abs Immature Granulocytes: 0.01 10*3/uL (ref 0.00–0.07)
Basophils Absolute: 0 10*3/uL (ref 0.0–0.1)
Basophils Relative: 0 %
Eosinophils Absolute: 0.1 10*3/uL (ref 0.0–0.5)
Eosinophils Relative: 2 %
HCT: 32.8 % — ABNORMAL LOW (ref 39.0–52.0)
Hemoglobin: 11 g/dL — ABNORMAL LOW (ref 13.0–17.0)
Immature Granulocytes: 0 %
Lymphocytes Relative: 34 %
Lymphs Abs: 1.8 10*3/uL (ref 0.7–4.0)
MCH: 31.8 pg (ref 26.0–34.0)
MCHC: 33.5 g/dL (ref 30.0–36.0)
MCV: 94.8 fL (ref 80.0–100.0)
Monocytes Absolute: 0.7 10*3/uL (ref 0.1–1.0)
Monocytes Relative: 12 %
Neutro Abs: 2.7 10*3/uL (ref 1.7–7.7)
Neutrophils Relative %: 52 %
Platelet Count: 187 10*3/uL (ref 150–400)
RBC: 3.46 MIL/uL — ABNORMAL LOW (ref 4.22–5.81)
RDW: 13.4 % (ref 11.5–15.5)
WBC Count: 5.3 10*3/uL (ref 4.0–10.5)
nRBC: 0 % (ref 0.0–0.2)

## 2020-10-15 LAB — CMP (CANCER CENTER ONLY)
ALT: 14 U/L (ref 0–44)
AST: 17 U/L (ref 15–41)
Albumin: 4.1 g/dL (ref 3.5–5.0)
Alkaline Phosphatase: 48 U/L (ref 38–126)
Anion gap: 7 (ref 5–15)
BUN: 16 mg/dL (ref 8–23)
CO2: 29 mmol/L (ref 22–32)
Calcium: 9.9 mg/dL (ref 8.9–10.3)
Chloride: 103 mmol/L (ref 98–111)
Creatinine: 1.32 mg/dL — ABNORMAL HIGH (ref 0.61–1.24)
GFR, Estimated: 53 mL/min — ABNORMAL LOW (ref >60)
Glucose, Bld: 102 mg/dL — ABNORMAL HIGH (ref 70–99)
Potassium: 3.6 mmol/L (ref 3.5–5.1)
Sodium: 139 mmol/L (ref 135–145)
Total Bilirubin: 0.8 mg/dL (ref 0.3–1.2)
Total Protein: 5.8 g/dL — ABNORMAL LOW (ref 6.5–8.1)

## 2020-10-15 MED ORDER — ACETAMINOPHEN 325 MG PO TABS
650.0000 mg | ORAL_TABLET | Freq: Once | ORAL | Status: AC
  Administered 2020-10-15: 650 mg via ORAL

## 2020-10-15 MED ORDER — ACETAMINOPHEN 325 MG PO TABS
ORAL_TABLET | ORAL | Status: AC
  Filled 2020-10-15: qty 2

## 2020-10-15 MED ORDER — SODIUM CHLORIDE 0.9% FLUSH
10.0000 mL | INTRAVENOUS | Status: DC | PRN
  Administered 2020-10-15: 10 mL via INTRAVENOUS
  Filled 2020-10-15: qty 10

## 2020-10-15 MED ORDER — DIPHENHYDRAMINE HCL 25 MG PO CAPS
ORAL_CAPSULE | ORAL | Status: AC
  Filled 2020-10-15: qty 2

## 2020-10-15 MED ORDER — HEPARIN SOD (PORK) LOCK FLUSH 100 UNIT/ML IV SOLN
500.0000 [IU] | Freq: Once | INTRAVENOUS | Status: AC
  Administered 2020-10-15: 500 [IU] via INTRAVENOUS
  Filled 2020-10-15: qty 5

## 2020-10-15 MED ORDER — DARATUMUMAB-HYALURONIDASE-FIHJ 1800-30000 MG-UT/15ML ~~LOC~~ SOLN
1800.0000 mg | Freq: Once | SUBCUTANEOUS | Status: AC
  Administered 2020-10-15: 1800 mg via SUBCUTANEOUS
  Filled 2020-10-15: qty 15

## 2020-10-15 MED ORDER — DEXAMETHASONE 4 MG PO TABS
20.0000 mg | ORAL_TABLET | Freq: Once | ORAL | Status: AC
  Administered 2020-10-15: 20 mg via ORAL

## 2020-10-15 MED ORDER — DIPHENHYDRAMINE HCL 25 MG PO CAPS
50.0000 mg | ORAL_CAPSULE | Freq: Once | ORAL | Status: AC
  Administered 2020-10-15: 50 mg via ORAL

## 2020-10-15 MED ORDER — DEXAMETHASONE 4 MG PO TABS
ORAL_TABLET | ORAL | Status: AC
  Filled 2020-10-15: qty 5

## 2020-10-15 NOTE — Patient Instructions (Signed)

## 2020-10-15 NOTE — Patient Instructions (Signed)
[facility]  Discharge Instructions: Thank you for choosing [facility] to provide your oncology and hematology care.   If you have a lab appointment with the [facility], please go directly to the [facility] and check in at the registration area.  Wear comfortable clothing and clothing appropriate for easy access to any Portacath or PICC line.   We strive to give you quality time with your provider. You may need to reschedule your appointment if you arrive late (15 or more minutes).  Arriving late affects you and other patients whose appointments are after yours.  Also, if you miss three or more appointments without notifying the office, you may be dismissed from the clinic at the provider's discretion.      For prescription refill requests, have your pharmacy contact our office and allow 72 hours for refills to be completed.    Today you received the following chemotherapy and/or immunotherapy agents Darzalex.   To help prevent nausea and vomiting after your treatment, we encourage you to take your nausea medication as directed.  BELOW ARE SYMPTOMS THAT SHOULD BE REPORTED IMMEDIATELY: . *FEVER GREATER THAN 100.4 F (38 C) OR HIGHER . *CHILLS OR SWEATING . *NAUSEA AND VOMITING THAT IS NOT CONTROLLED WITH YOUR NAUSEA MEDICATION . *UNUSUAL SHORTNESS OF BREATH . *UNUSUAL BRUISING OR BLEEDING . *URINARY PROBLEMS (pain or burning when urinating, or frequent urination) . *BOWEL PROBLEMS (unusual diarrhea, constipation, pain near the anus) . TENDERNESS IN MOUTH AND THROAT WITH OR WITHOUT PRESENCE OF ULCERS (sore throat, sores in mouth, or a toothache) . UNUSUAL RASH, SWELLING OR PAIN  . UNUSUAL VAGINAL DISCHARGE OR ITCHING   Items with * indicate a potential emergency and should be followed up as soon as possible or go to the Emergency Department if any problems should occur.  Please show the CHEMOTHERAPY ALERT CARD or IMMUNOTHERAPY ALERT CARD at  check-in to the Emergency Department and triage nurse. Should you have questions after your visit or need to cancel or reschedule your appointment, please contact [facility]  [phone] and follow the prompts.  Office hours are 8:00 a.m. to 4:30 p.m. Monday - Friday. Please note that voicemails left after 4:00 p.m. may not be returned until the following business day.  We are closed weekends and major holidays. You have access to a nurse at all times for urgent questions. Please call the main number to the clinic [phone] and follow the prompts.  For any non-urgent questions, you may also contact your provider using MyChart. We now offer e-Visits for anyone 31 and older to request care online for non-urgent symptoms. For details visit [name].   Also download the MyChart app! Go to the app store, search "MyChart", open the app, select [facility], and log in with your MyChart username and password.  Due to Covid, a mask is required upon entering the hospital/clinic. If you do not have a mask, one will be given to you upon arrival. For doctor visits, patients may have 1 support person aged 35 or older with them. For treatment visits, patients cannot have anyone with them due to current Covid guidelines and our immunocompromised population.

## 2020-10-15 NOTE — Telephone Encounter (Signed)
Pt to gain sch per 10/15/20 los in tx/avs and through [employer]

## 2020-10-15 NOTE — Progress Notes (Signed)
Pt. Refused to wait one hour post injection. Released stable and asymptomatic.

## 2020-10-15 NOTE — Progress Notes (Signed)
Hematology and Oncology Follow Up Visit  SELIM [name] [id number] 1933/08/14 87 y.o. 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 Aranesp 300 mcg subcu q. 3 weeks for hemoglobin less than 10   Interim History:  Robert [name] is here today for follow-up and treatment.  He is doing okay.  He has gained a little bit of weight.  He thinks this might be secondary to not taking Lasix.  So far, he has responded very nicely to the daratumumab.  There was no monoclonal spike in his blood with his April blood check.  His IgG level was 484 mg/dL.  The Kappa light chain was 1.0 mg/dL.  He has had no problems with bowels or bladder.  He has had no cough or shortness of breath.  He has had no bleeding.  There is no rashes.  He has had little bit of swelling in his legs.  Hopefully, with the summer weather, he will be outside and exercising more.  Overall, his performance status is ECOG 1.   Medications:  Allergies as of 10/15/2020   No Known Allergies     Medication List       Accurate as of Oct 15, 2020  9:41 AM. If you have any questions, ask your nurse or doctor.        amoxicillin 500 MG capsule Commonly known as: AMOXIL TAKE 4 CAPSULES (2,000 MG TOTAL) BY MOUTH ONCE FOR 1 DOSE. TAKE 1 HOUR BEFORE DENTAL WORK   aspirin 81 MG tablet Take 81 mg by mouth daily.   bimatoprost 0.01 % Soln Commonly known as: LUMIGAN Place 1 drop into both eyes at bedtime.   CALTRATE 600+D PLUS PO Take 1 tablet by mouth daily.   ciclopirox 0.77 % cream Commonly known as:  LOPROX APPLY CREAM TOPICALLY ONCE DAILY   cloNIDine 0.1 MG tablet Commonly known as: CATAPRES Take 0.1 mg by mouth 2 (two) times daily as needed.   cyanocobalamin 2000 MCG tablet Take 2,000 mcg by mouth at bedtime.   famciclovir 250 MG tablet Commonly known as: FAMVIR TAKE 1 TABLET DAILY   furosemide 20 MG tablet Commonly known as: LASIX Take 2 tablets (40 mg total) by mouth daily.   losartan 100 MG tablet Commonly known as: COZAAR Take 100 mg by mouth daily.   metolazone 5 MG tablet Commonly known as: ZAROXOLYN TAKE 1 TABLET 1 HOUR PRIOR TO TAKING LASIX DAILY   montelukast 10 MG tablet Commonly known as: Singulair Take 1 tablet (10 mg total) by mouth at bedtime.   multivitamin with minerals Tabs tablet Take 1 tablet by mouth daily.   Pfizer-BioNT COVID-19 Vac-TriS Susp injection Generic drug: COVID-19 mRNA Vac-TriS (Pfizer) Inject into the muscle.   potassium chloride SA 20 MEQ tablet Commonly known as: KLOR-CON Take 20 mEq by mouth daily.   rosuvastatin 20 MG tablet Commonly known as: CRESTOR Take 20 mg by mouth at bedtime.   SYSTANE OP Place 1 drop into both eyes daily as needed (dry eyes).   Vitamin D3 25 MCG (1000  UT) Caps Take 1,000 Units by mouth daily.       Allergies: No Known Allergies  Past Medical History, Surgical history, Social history, and Family History were reviewed and updated.  Review of Systems: Review of Systems  Constitutional: Negative.   HENT: Negative.   Eyes: Negative.   Respiratory: Negative.   Cardiovascular: Negative.   Gastrointestinal: Negative.   Genitourinary: Negative.   Musculoskeletal: Negative.   Skin: Negative.   Neurological: Negative.   Endo/Heme/Allergies: Negative.   Psychiatric/Behavioral: Negative.       Physical Exam:  weight is 221 lb (100.2 kg). His oral temperature is 97.8 F (36.6 C). His blood pressure is 164/81 (abnormal) and his pulse is 60. His respiration is 18 and oxygen saturation is  99%.   Wt Readings from Last 3 Encounters:  10/15/20 221 lb (100.2 kg)  09/16/20 217 lb (98.4 kg)  08/20/20 218 lb 1.9 oz (98.9 kg)    Physical Exam Vitals reviewed.  HENT:     Head: Normocephalic and atraumatic.  Eyes:     Pupils: Pupils are equal, round, and reactive to light.  Cardiovascular:     Rate and Rhythm: Normal rate and regular rhythm.     Heart sounds: Normal heart sounds.  Pulmonary:     Effort: Pulmonary effort is normal.     Breath sounds: Normal breath sounds.  Abdominal:     General: Bowel sounds are normal.     Palpations: Abdomen is soft.  Musculoskeletal:        General: No tenderness or deformity. Normal range of motion.     Cervical back: Normal range of motion.  Lymphadenopathy:     Cervical: No cervical adenopathy.  Skin:    General: Skin is warm and dry.     Findings: No erythema or rash.  Neurological:     Mental Status: He is alert and oriented to person, place, and time.  Psychiatric:        Behavior: Behavior normal.        Thought Content: Thought content normal.        Judgment: Judgment normal.      Lab Results  Component Value Date   WBC 5.3 10/15/2020   HGB 11.0 (L) 10/15/2020   HCT 32.8 (L) 10/15/2020   MCV 94.8 10/15/2020   PLT 187 10/15/2020   Lab Results  Component Value Date   FERRITIN 1,061 (H) 06/28/2019   IRON 82 06/28/2019   TIBC 281 06/28/2019   UIBC 200 06/28/2019   IRONPCTSAT 29 06/28/2019   Lab Results  Component Value Date   RETICCTPCT 2.2 04/05/2019   RBC 3.46 (L) 10/15/2020   Lab Results  Component Value Date   KPAFRELGTCHN 10.2 09/16/2020   LAMBDASER 5.9 09/16/2020   KAPLAMBRATIO 1.73 (H) 09/16/2020   Lab Results  Component Value Date   IGGSERUM 484 (L) 09/16/2020   IGA 19 (L) 09/16/2020   IGMSERUM 31 09/16/2020   Lab Results  Component Value Date   TOTALPROTELP 5.6 (L) 09/16/2020   ALBUMINELP 3.4 09/16/2020   A1GS 0.2 09/16/2020   A2GS 0.8 09/16/2020   BETS 0.9 09/16/2020   BETA2SER  0.3 01/03/2015   GAMS 0.4 09/16/2020   MSPIKE Not Observed 09/16/2020   SPEI Comment 06/26/2020     Chemistry      Component Value Date/Time   NA 139 10/15/2020 0830   NA 137 04/08/2017 1141   NA 138 10/29/2015 1029   K 3.6 10/15/2020 0830   K 3.4  04/08/2017 1141   K 3.9 10/29/2015 1029   CL 103 10/15/2020 0830   CL 102 04/08/2017 1141   CO2 29 10/15/2020 0830   CO2 30 04/08/2017 1141   CO2 27 10/29/2015 1029   BUN 16 10/15/2020 0830   BUN 16 04/08/2017 1141   BUN 13.8 10/29/2015 1029   CREATININE 1.32 (H) 10/15/2020 0830   CREATININE 1.3 (H) 04/08/2017 1141   CREATININE 1.1 10/29/2015 1029      Component Value Date/Time   CALCIUM 9.9 10/15/2020 0830   CALCIUM 9.3 04/08/2017 1141   CALCIUM 9.3 10/29/2015 1029   ALKPHOS 48 10/15/2020 0830   ALKPHOS 66 04/08/2017 1141   ALKPHOS 52 10/29/2015 1029   AST 17 10/15/2020 0830   AST 20 10/29/2015 1029   ALT 14 10/15/2020 0830   ALT 21 04/08/2017 1141   ALT 14 10/29/2015 1029   BILITOT 0.8 10/15/2020 0830   BILITOT 0.75 10/29/2015 1029       Impression and Plan: Robert [name] is a very pleasant 87 yo African American gentleman with relapsed IgG kappa myeloma. He has history of stem cell transplant in 2006.   I am just glad that his quality of life is doing well.  Again, he is responding nicely to treatment.  He has had no side effects from treatment.  We will go ahead and plan to get him back to see [place] in another month.   Volanda Napoleon, MD 5/24/20229:41 AM

## 2020-10-16 LAB — KAPPA/LAMBDA LIGHT CHAINS
Kappa free light chain: 10.3 mg/L (ref 3.3–19.4)
Kappa, lambda light chain ratio: 2.24 — ABNORMAL HIGH (ref 0.26–1.65)
Lambda free light chains: 4.6 mg/L — ABNORMAL LOW (ref 5.7–26.3)

## 2020-10-16 LAB — IGG, IGA, IGM
IgA: 21 mg/dL — ABNORMAL LOW (ref 61–437)
IgG (Immunoglobin G), Serum: 498 mg/dL — ABNORMAL LOW (ref 603–1613)
IgM (Immunoglobulin M), Srm: 29 mg/dL (ref 15–143)

## 2020-10-17 LAB — PROTEIN ELECTROPHORESIS, SERUM, WITH REFLEX
A/G Ratio: 1.8 — ABNORMAL HIGH (ref 0.7–1.7)
Albumin ELP: 3.5 g/dL (ref 2.9–4.4)
Alpha-1-Globulin: 0.1 g/dL (ref 0.0–0.4)
Alpha-2-Globulin: 0.8 g/dL (ref 0.4–1.0)
Beta Globulin: 0.7 g/dL (ref 0.7–1.3)
Gamma Globulin: 0.4 g/dL (ref 0.4–1.8)
Globulin, Total: 2 g/dL — ABNORMAL LOW (ref 2.2–3.9)
Total Protein ELP: 5.5 g/dL — ABNORMAL LOW (ref 6.0–8.5)

## 2020-11-10 ENCOUNTER — Encounter: Payer: Self-pay | Admitting: Hematology & Oncology

## 2020-11-10 ENCOUNTER — Encounter: Payer: Self-pay | Admitting: Family

## 2020-11-13 ENCOUNTER — Inpatient Hospital Stay: Payer: Medicare Other

## 2020-11-13 ENCOUNTER — Encounter: Payer: Self-pay | Admitting: Hematology & Oncology

## 2020-11-13 ENCOUNTER — Encounter: Payer: Self-pay | Admitting: Family

## 2020-11-13 ENCOUNTER — Other Ambulatory Visit: Payer: Self-pay

## 2020-11-13 ENCOUNTER — Inpatient Hospital Stay: Payer: Medicare Other | Attending: Hematology & Oncology

## 2020-11-13 ENCOUNTER — Inpatient Hospital Stay (HOSPITAL_BASED_OUTPATIENT_CLINIC_OR_DEPARTMENT_OTHER): Payer: Medicare Other | Admitting: Hematology & Oncology

## 2020-11-13 VITALS — BP 163/78 | HR 64 | Temp 97.6°F | Resp 20 | Wt 220.1 lb

## 2020-11-13 VITALS — BP 172/74 | HR 59

## 2020-11-13 DIAGNOSIS — C9001 Multiple myeloma in remission: Secondary | ICD-10-CM

## 2020-11-13 DIAGNOSIS — Z5112 Encounter for antineoplastic immunotherapy: Secondary | ICD-10-CM | POA: Diagnosis not present

## 2020-11-13 DIAGNOSIS — Z79899 Other long term (current) drug therapy: Secondary | ICD-10-CM | POA: Insufficient documentation

## 2020-11-13 DIAGNOSIS — C9000 Multiple myeloma not having achieved remission: Secondary | ICD-10-CM

## 2020-11-13 DIAGNOSIS — C9002 Multiple myeloma in relapse: Secondary | ICD-10-CM | POA: Insufficient documentation

## 2020-11-13 LAB — CBC WITH DIFFERENTIAL (CANCER CENTER ONLY)
Abs Immature Granulocytes: 0.01 10*3/uL (ref 0.00–0.07)
Basophils Absolute: 0 10*3/uL (ref 0.0–0.1)
Basophils Relative: 0 %
Eosinophils Absolute: 0.1 10*3/uL (ref 0.0–0.5)
Eosinophils Relative: 2 %
HCT: 32.8 % — ABNORMAL LOW (ref 39.0–52.0)
Hemoglobin: 10.9 g/dL — ABNORMAL LOW (ref 13.0–17.0)
Immature Granulocytes: 0 %
Lymphocytes Relative: 39 %
Lymphs Abs: 2.1 10*3/uL (ref 0.7–4.0)
MCH: 31.8 pg (ref 26.0–34.0)
MCHC: 33.2 g/dL (ref 30.0–36.0)
MCV: 95.6 fL (ref 80.0–100.0)
Monocytes Absolute: 0.6 10*3/uL (ref 0.1–1.0)
Monocytes Relative: 11 %
Neutro Abs: 2.6 10*3/uL (ref 1.7–7.7)
Neutrophils Relative %: 48 %
Platelet Count: 182 10*3/uL (ref 150–400)
RBC: 3.43 MIL/uL — ABNORMAL LOW (ref 4.22–5.81)
RDW: 13.2 % (ref 11.5–15.5)
WBC Count: 5.4 10*3/uL (ref 4.0–10.5)
nRBC: 0 % (ref 0.0–0.2)

## 2020-11-13 LAB — CMP (CANCER CENTER ONLY)
ALT: 11 U/L (ref 0–44)
AST: 14 U/L — ABNORMAL LOW (ref 15–41)
Albumin: 4 g/dL (ref 3.5–5.0)
Alkaline Phosphatase: 46 U/L (ref 38–126)
Anion gap: 7 (ref 5–15)
BUN: 19 mg/dL (ref 8–23)
CO2: 28 mmol/L (ref 22–32)
Calcium: 9.9 mg/dL (ref 8.9–10.3)
Chloride: 104 mmol/L (ref 98–111)
Creatinine: 1.21 mg/dL (ref 0.61–1.24)
GFR, Estimated: 59 mL/min — ABNORMAL LOW (ref >60)
Glucose, Bld: 106 mg/dL — ABNORMAL HIGH (ref 70–99)
Potassium: 3.8 mmol/L (ref 3.5–5.1)
Sodium: 139 mmol/L (ref 135–145)
Total Bilirubin: 0.7 mg/dL (ref 0.3–1.2)
Total Protein: 5.9 g/dL — ABNORMAL LOW (ref 6.5–8.1)

## 2020-11-13 LAB — LACTATE DEHYDROGENASE: LDH: 190 U/L (ref 98–192)

## 2020-11-13 MED ORDER — DEXAMETHASONE 4 MG PO TABS
ORAL_TABLET | ORAL | Status: AC
  Filled 2020-11-13: qty 5

## 2020-11-13 MED ORDER — DARATUMUMAB-HYALURONIDASE-FIHJ 1800-30000 MG-UT/15ML ~~LOC~~ SOLN
1800.0000 mg | Freq: Once | SUBCUTANEOUS | Status: AC
  Administered 2020-11-13: 1800 mg via SUBCUTANEOUS
  Filled 2020-11-13: qty 15

## 2020-11-13 MED ORDER — DIPHENHYDRAMINE HCL 25 MG PO CAPS
ORAL_CAPSULE | ORAL | Status: AC
  Filled 2020-11-13: qty 2

## 2020-11-13 MED ORDER — ACETAMINOPHEN 325 MG PO TABS
650.0000 mg | ORAL_TABLET | Freq: Once | ORAL | Status: AC
  Administered 2020-11-13: 650 mg via ORAL

## 2020-11-13 MED ORDER — ACETAMINOPHEN 325 MG PO TABS
ORAL_TABLET | ORAL | Status: AC
  Filled 2020-11-13: qty 2

## 2020-11-13 MED ORDER — HEPARIN SOD (PORK) LOCK FLUSH 100 UNIT/ML IV SOLN
500.0000 [IU] | Freq: Once | INTRAVENOUS | Status: AC | PRN
  Administered 2020-11-13: 500 [IU]
  Filled 2020-11-13: qty 5

## 2020-11-13 MED ORDER — SODIUM CHLORIDE 0.9 % IV SOLN
Freq: Once | INTRAVENOUS | Status: DC
  Filled 2020-11-13: qty 250

## 2020-11-13 MED ORDER — DEXAMETHASONE 4 MG PO TABS
20.0000 mg | ORAL_TABLET | Freq: Once | ORAL | Status: AC
Start: 2020-11-13 — End: 2020-11-13
  Administered 2020-11-13: 20 mg via ORAL

## 2020-11-13 MED ORDER — DIPHENHYDRAMINE HCL 25 MG PO CAPS
50.0000 mg | ORAL_CAPSULE | Freq: Once | ORAL | Status: AC
  Administered 2020-11-13: 50 mg via ORAL

## 2020-11-13 MED ORDER — SODIUM CHLORIDE 0.9% FLUSH
10.0000 mL | INTRAVENOUS | Status: DC | PRN
  Administered 2020-11-13: 10 mL
  Filled 2020-11-13: qty 10

## 2020-11-13 NOTE — Patient Instructions (Signed)
Implanted Port Home Guide An implanted port is a device that is placed under the skin. It is usually placed in the chest. The device can be used to give IV medicine, to take blood, or for dialysis. You may have an implanted port if: You need IV medicine that would be irritating to the small veins in your hands or arms. You need IV medicines, such as antibiotics, for a long period of time. You need IV nutrition for a long period of time. You need dialysis. When you have a port, your health care provider can choose to use the port instead of veins in your arms for these procedures. You may have fewer limitations when using a port than you would if you used other types of long-term IVs, and you will likely be able to return to normal activities afteryour incision heals. An implanted port has two main parts: Reservoir. The reservoir is the part where a needle is inserted to give medicines or draw blood. The reservoir is round. After it is placed, it appears as a small, raised area under your skin. Catheter. The catheter is a thin, flexible tube that connects the reservoir to a vein. Medicine that is inserted into the reservoir goes into the catheter and then into the vein. How is my port accessed? To access your port: A numbing cream may be placed on the skin over the port site. Your health care provider will put on a mask and sterile gloves. The skin over your port will be cleaned carefully with a germ-killing soap and allowed to dry. Your health care provider will gently pinch the port and insert a needle into it. Your health care provider will check for a blood return to make sure the port is in the vein and is not clogged. If your port needs to remain accessed to get medicine continuously (constant infusion), your health care provider will place a clear bandage (dressing) over the needle site. The dressing and needle will need to be changed every week, or as told by your health care provider. What  is flushing? Flushing helps keep the port from getting clogged. Follow instructions from your health care provider about how and when to flush the port. Ports are usually flushed with saline solution or a medicine called heparin. The need for flushing will depend on how the port is used: If the port is only used from time to time to give medicines or draw blood, the port may need to be flushed: Before and after medicines have been given. Before and after blood has been drawn. As part of routine maintenance. Flushing may be recommended every 4-6 weeks. If a constant infusion is running, the port may not need to be flushed. Throw away any syringes in a disposal container that is meant for sharp items (sharps container). You can buy a sharps container from a pharmacy, or you can make one by using an empty hard plastic bottle with a cover. How long will my port stay implanted? The port can stay in for as long as your health care provider thinks it is needed. When it is time for the port to come out, a surgery will be done to remove it. The surgery will be similar to the procedure that was done to putthe port in. Follow these instructions at home:  Flush your port as told by your health care provider. If you need an infusion over several days, follow instructions from your health care provider about how to take   care of your port site. Make sure you: Wash your hands with soap and water before you change your dressing. If soap and water are not available, use alcohol-based hand sanitizer. Change your dressing as told by your health care provider. Place any used dressings or infusion bags into a plastic bag. Throw that bag in the trash. Keep the dressing that covers the needle clean and dry. Do not get it wet. Do not use scissors or sharp objects near the tube. Keep the tube clamped, unless it is being used. Check your port site every day for signs of infection. Check for: Redness, swelling, or  pain. Fluid or blood. Pus or a bad smell. Protect the skin around the port site. Avoid wearing bra straps that rub or irritate the site. Protect the skin around your port from seat belts. Place a soft pad over your chest if needed. Bathe or shower as told by your health care provider. The site may get wet as long as you are not actively receiving an infusion. Return to your normal activities as told by your health care provider. Ask your health care provider what activities are safe for you. Carry a medical alert card or wear a medical alert bracelet at all times. This will let health care providers know that you have an implanted port in case of an emergency. Get help right away if: You have redness, swelling, or pain at the port site. You have fluid or blood coming from your port site. You have pus or a bad smell coming from the port site. You have a fever. Summary Implanted ports are usually placed in the chest for long-term IV access. Follow instructions from your health care provider about flushing the port and changing bandages (dressings). Take care of the area around your port by avoiding clothing that puts pressure on the area, and by watching for signs of infection. Protect the skin around your port from seat belts. Place a soft pad over your chest if needed. Get help right away if you have a fever or you have redness, swelling, pain, drainage, or a bad smell at the port site. This information is not intended to replace advice given to you by your health care provider. Make sure you discuss any questions you have with your healthcare provider. Document Revised: 09/25/2019 Document Reviewed: 09/25/2019 Elsevier Patient Education  2022 Elsevier Inc.  

## 2020-11-13 NOTE — Progress Notes (Signed)
Hematology and Oncology Follow Up Visit  Robert [name] [id number] August 06, 1933 87 y.o. 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 Aranesp 300 mcg subcu q. 3 weeks for hemoglobin less than 10   Interim History:  Robert [name] is here today for follow-up and treatment.  So far, he is doing quite well.  Unfortunately, has not lost weight.  I think this is going to be what is going to dictate his health.  Hopefully, he will lose weight.  I know he will try to be active over the summer.  His last myeloma studies back in May did not show a monoclonal spike.  His IgG level was 500 mg/dL.  The Kappa light chain was 1.0 mg/dL.  There is been no problems with bowels or bladder.  He has had no rashes.  There is been no bleeding.  He has had no cough or shortness of breath.  Overall, his performance status is ECOG 1.    Medications:  Allergies as of 11/13/2020   No Known Allergies      Medication List        Accurate as of November 13, 2020  8:11 AM. If you have any questions, ask your nurse or doctor.          amoxicillin 500 MG capsule Commonly known as: AMOXIL TAKE 4 CAPSULES (2,000 MG TOTAL) BY MOUTH ONCE FOR 1 DOSE. TAKE 1 HOUR BEFORE DENTAL WORK   aspirin 81 MG tablet Take 81 mg by mouth daily.   bimatoprost 0.01 % Soln Commonly known as: LUMIGAN Place 1 drop into both eyes at bedtime.   CALTRATE 600+D PLUS PO Take 1 tablet by mouth daily.   ciclopirox 0.77 % cream Commonly known as: LOPROX APPLY CREAM TOPICALLY ONCE DAILY   cloNIDine 0.1 MG  tablet Commonly known as: CATAPRES Take 0.1 mg by mouth 2 (two) times daily as needed.   cyanocobalamin 2000 MCG tablet Take 2,000 mcg by mouth at bedtime.   famciclovir 250 MG tablet Commonly known as: FAMVIR TAKE 1 TABLET DAILY   furosemide 20 MG tablet Commonly known as: LASIX Take 2 tablets (40 mg total) by mouth daily.   losartan 100 MG tablet Commonly known as: COZAAR Take 100 mg by mouth daily.   metolazone 5 MG tablet Commonly known as: ZAROXOLYN TAKE 1 TABLET 1 HOUR PRIOR TO TAKING LASIX DAILY   montelukast 10 MG tablet Commonly known as: Singulair Take 1 tablet (10 mg total) by mouth at bedtime.   multivitamin with minerals Tabs tablet Take 1 tablet by mouth daily.   Pfizer-BioNT COVID-19 Vac-TriS Susp injection Generic drug: COVID-19 mRNA Vac-TriS (Pfizer) Inject into the muscle.   potassium chloride SA 20 MEQ tablet Commonly known as: KLOR-CON Take 20 mEq by mouth daily.   rosuvastatin 20 MG tablet Commonly known as: CRESTOR Take 20 mg by mouth at bedtime.   SYSTANE OP Place 1 drop into both eyes daily as needed (dry eyes).   Vitamin D3 25 MCG (1000  UT) Caps Take 1,000 Units by mouth daily.        Allergies: No Known Allergies  Past Medical History, Surgical history, Social history, and Family History were reviewed and updated.  Review of Systems: Review of Systems  Constitutional: Negative.   HENT: Negative.    Eyes: Negative.   Respiratory: Negative.    Cardiovascular: Negative.   Gastrointestinal: Negative.   Genitourinary: Negative.   Musculoskeletal: Negative.   Skin: Negative.   Neurological: Negative.   Endo/Heme/Allergies: Negative.   Psychiatric/Behavioral: Negative.       Physical Exam:  vitals were not taken for this visit.   Wt Readings from Last 3 Encounters:  10/15/20 221 lb (100.2 kg)  09/16/20 217 lb (98.4 kg)  08/20/20 218 lb 1.9 oz (98.9 kg)    Physical Exam Vitals reviewed.  HENT:     Head:  Normocephalic and atraumatic.  Eyes:     Pupils: Pupils are equal, round, and reactive to light.  Cardiovascular:     Rate and Rhythm: Normal rate and regular rhythm.     Heart sounds: Normal heart sounds.  Pulmonary:     Effort: Pulmonary effort is normal.     Breath sounds: Normal breath sounds.  Abdominal:     General: Bowel sounds are normal.     Palpations: Abdomen is soft.  Musculoskeletal:        General: No tenderness or deformity. Normal range of motion.     Cervical back: Normal range of motion.  Lymphadenopathy:     Cervical: No cervical adenopathy.  Skin:    General: Skin is warm and dry.     Findings: No erythema or rash.  Neurological:     Mental Status: He is alert and oriented to person, place, and time.  Psychiatric:        Behavior: Behavior normal.        Thought Content: Thought content normal.        Judgment: Judgment normal.     Lab Results  Component Value Date   WBC 5.3 10/15/2020   HGB 11.0 (L) 10/15/2020   HCT 32.8 (L) 10/15/2020   MCV 94.8 10/15/2020   PLT 187 10/15/2020   Lab Results  Component Value Date   FERRITIN 1,061 (H) 06/28/2019   IRON 82 06/28/2019   TIBC 281 06/28/2019   UIBC 200 06/28/2019   IRONPCTSAT 29 06/28/2019   Lab Results  Component Value Date   RETICCTPCT 2.2 04/05/2019   RBC 3.46 (L) 10/15/2020   Lab Results  Component Value Date   KPAFRELGTCHN 10.3 10/15/2020   LAMBDASER 4.6 (L) 10/15/2020   KAPLAMBRATIO 2.24 (H) 10/15/2020   Lab Results  Component Value Date   IGGSERUM 498 (L) 10/15/2020   IGA 21 (L) 10/15/2020   IGMSERUM 29 10/15/2020   Lab Results  Component Value Date   TOTALPROTELP 5.5 (L) 10/15/2020   ALBUMINELP 3.5 10/15/2020   A1GS 0.1 10/15/2020   A2GS 0.8 10/15/2020   BETS 0.7 10/15/2020   BETA2SER 0.3 01/03/2015   GAMS 0.4 10/15/2020   MSPIKE Not Observed 10/15/2020   SPEI Comment 06/26/2020     Chemistry      Component Value Date/Time   NA 139 10/15/2020 0830   NA 137  04/08/2017 1141   NA 138 10/29/2015 1029   K 3.6 10/15/2020 0830   K 3.4 04/08/2017 1141   K 3.9 10/29/2015 1029   CL 103 10/15/2020 0830   CL 102 04/08/2017 1141   CO2 29  10/15/2020 0830   CO2 30 04/08/2017 1141   CO2 27 10/29/2015 1029   BUN 16 10/15/2020 0830   BUN 16 04/08/2017 1141   BUN 13.8 10/29/2015 1029   CREATININE 1.32 (H) 10/15/2020 0830   CREATININE 1.3 (H) 04/08/2017 1141   CREATININE 1.1 10/29/2015 1029      Component Value Date/Time   CALCIUM 9.9 10/15/2020 0830   CALCIUM 9.3 04/08/2017 1141   CALCIUM 9.3 10/29/2015 1029   ALKPHOS 48 10/15/2020 0830   ALKPHOS 66 04/08/2017 1141   ALKPHOS 52 10/29/2015 1029   AST 17 10/15/2020 0830   AST 20 10/29/2015 1029   ALT 14 10/15/2020 0830   ALT 21 04/08/2017 1141   ALT 14 10/29/2015 1029   BILITOT 0.8 10/15/2020 0830   BILITOT 0.75 10/29/2015 1029       Impression and Plan: Robert [name] is a very pleasant 87 yo African American gentleman with relapsed IgG kappa myeloma. He has history of stem cell transplant in 2006.   So far, everything seems to be doing nicely.  There are myeloma is not active from what I can tell.  The daratumumab clearly is helping him out.  He has a good quality of life.  I just wish he would be a little more active and lose some weight.  As always, we will plan for another follow-up in 4 weeks.   Volanda Napoleon, MD 6/22/20228:11 AM

## 2020-11-13 NOTE — Patient Instructions (Signed)
[facility] [name] [facility]  Discharge Instructions: Thank you for choosing [facility] to provide your oncology and hematology care.   If you have a lab appointment with the [facility], please go directly to the [facility] and check in at the registration area.  Wear comfortable clothing and clothing appropriate for easy access to any Portacath or PICC line.   We strive to give you quality time with your provider. You may need to reschedule your appointment if you arrive late (15 or more minutes).  Arriving late affects you and other patients whose appointments are after yours.  Also, if you miss three or more appointments without notifying the office, you may be dismissed from the clinic at the provider's discretion.      For prescription refill requests, have your pharmacy contact our office and allow 72 hours for refills to be completed.    Today you received the following chemotherapy and/or immunotherapy agents darzalex      To help prevent nausea and vomiting after your treatment, we encourage you to take your nausea medication as directed.  BELOW ARE SYMPTOMS THAT SHOULD BE REPORTED IMMEDIATELY: *FEVER GREATER THAN 100.4 F (38 C) OR HIGHER *CHILLS OR SWEATING *NAUSEA AND VOMITING THAT IS NOT CONTROLLED WITH YOUR NAUSEA MEDICATION *UNUSUAL SHORTNESS OF BREATH *UNUSUAL BRUISING OR BLEEDING *URINARY PROBLEMS (pain or burning when urinating, or frequent urination) *BOWEL PROBLEMS (unusual diarrhea, constipation, pain near the anus) TENDERNESS IN MOUTH AND THROAT WITH OR WITHOUT PRESENCE OF ULCERS (sore throat, sores in mouth, or a toothache) UNUSUAL RASH, SWELLING OR PAIN  UNUSUAL VAGINAL DISCHARGE OR ITCHING   Items with * indicate a potential emergency and should be followed up as soon as possible or go to the Emergency Department if any problems should occur.  Please show the CHEMOTHERAPY ALERT CARD or IMMUNOTHERAPY ALERT CARD at check-in to the  Emergency Department and triage nurse. Should you have questions after your visit or need to cancel or reschedule your appointment, please contact [facility]  [phone] and follow the prompts.  Office hours are 8:00 a.m. to 4:30 p.m. Monday - Friday. Please note that voicemails left after 4:00 p.m. may not be returned until the following business day.  We are closed weekends and major holidays. You have access to a nurse at all times for urgent questions. Please call the main number to the clinic [phone] and follow the prompts.  For any non-urgent questions, you may also contact your provider using MyChart. We now offer e-Visits for anyone 57 and older to request care online for non-urgent symptoms. For details visit [name].   Also download the MyChart app! Go to the app store, search "MyChart", open the app, select [facility], and log in with your MyChart username and password.  Due to Covid, a mask is required upon entering the hospital/clinic. If you do not have a mask, one will be given to you upon arrival. For doctor visits, patients may have 1 support person aged 26 or older with them. For treatment visits, patients cannot have anyone with them due to current Covid guidelines and our immunocompromised population. Daratumumab injection What is this medication? DARATUMUMAB (dar a toom ue mab) is a monoclonal antibody. It is used to treatmultiple myeloma. This medicine may be used for other purposes; ask your health care provider orpharmacist if you have questions. COMMON BRAND NAME(S): DARZALEX What should I tell my care team before I take this  medication? They need to know if you have any of these conditions: hereditary fructose intolerance infection (especially a virus infection such as chickenpox, herpes, or hepatitis B virus) lung or breathing disease (asthma, COPD) an unusual or allergic reaction to daratumumab, sorbitol, other  medicines, foods, dyes, or preservatives pregnant or trying to get pregnant breast-feeding How should I use this medication? This medicine is for infusion into a vein. It is given by a health careprofessional in a hospital or clinic setting. Talk to your pediatrician regarding the use of this medicine in children.Special care may be needed. Overdosage: If you think you have taken too much of this medicine contact apoison control center or emergency room at once. NOTE: This medicine is only for you. Do not share this medicine with others. What if I miss a dose? Keep appointments for follow-up doses as directed. It is important not to miss your dose. Call your doctor or health care professional if you are unable tokeep an appointment. What may interact with this medication? Interactions have not been studied. This list may not describe all possible interactions. Give your health care provider a list of all the medicines, herbs, non-prescription drugs, or dietary supplements you use. Also tell them if you smoke, drink alcohol, or use illegaldrugs. Some items may interact with your medicine. What should I watch for while using this medication? Your condition will be monitored carefully while you are receiving thismedicine. This medicine can cause serious allergic reactions. To reduce your risk, your health care provider may give you other medicine to take before receiving thisone. Be sure to follow the directions from your health care provider. This medicine can affect the results of blood tests to match your blood type. These changes can last for up to 6 months after the final dose. Your healthcare provider will do blood tests to match your blood type before you start treatment. Tell all of your healthcare providers that you are being treatedwith this medicine before receiving a blood transfusion. This medicine can affect the results of some tests used to determine treatmentresponse; extra tests may be  needed to evaluate response. Do not become pregnant while taking this medicine or for 3 months after stopping it. Women should inform their health care provider if they wish to become pregnant or think they might be pregnant. There is a potential for serious side effects to an unborn child. Talk to your health care provider formore information. Do not breast-feed an infant while taking this medicine. What side effects may I notice from receiving this medication? Side effects that you should report to your doctor or health care professionalas soon as possible: allergic reactions (skin rash, itching, hives, swelling of the face, lips, or tongue) blurred vision infection (fever, chills, cough, sore throat, pain or difficulty passing urine) infusion reaction (dizziness, fast heartbeat, feeling faint or lightheaded, falls, headache, increase in blood pressure, nausea, vomiting, or wheezing or trouble breathing with loud or whistling sounds) unusual bleeding or bruising Side effects that usually do not require medical attention (report to yourdoctor or health care professional if they continue or are bothersome): constipation diarrhea pain, tingling, numbness in the hands or feet swelling of the ankles, feet, hands tiredness This list may not describe all possible side effects. Call your doctor for medical advice about side effects. You may report side effects to FDA at1-800-FDA-1088. Where should I keep my medication? This drug is given in a hospital or clinic and will not be stored at home. NOTE: This sheet  is a summary. It may not cover all possible information. If you have questions about this medicine, talk to your doctor, pharmacist, orhealth care provider.  2022 Elsevier/Gold Standard (2020-06-20 12:50:38)

## 2020-11-14 LAB — PROTEIN ELECTROPHORESIS, SERUM, WITH REFLEX
A/G Ratio: 1.9 — ABNORMAL HIGH (ref 0.7–1.7)
Albumin ELP: 3.7 g/dL (ref 2.9–4.4)
Alpha-1-Globulin: 0.2 g/dL (ref 0.0–0.4)
Alpha-2-Globulin: 0.8 g/dL (ref 0.4–1.0)
Beta Globulin: 0.7 g/dL (ref 0.7–1.3)
Gamma Globulin: 0.4 g/dL (ref 0.4–1.8)
Globulin, Total: 2 g/dL — ABNORMAL LOW (ref 2.2–3.9)
Total Protein ELP: 5.7 g/dL — ABNORMAL LOW (ref 6.0–8.5)

## 2020-11-14 LAB — KAPPA/LAMBDA LIGHT CHAINS
Kappa free light chain: 11.4 mg/L (ref 3.3–19.4)
Kappa, lambda light chain ratio: 1.68 — ABNORMAL HIGH (ref 0.26–1.65)
Lambda free light chains: 6.8 mg/L (ref 5.7–26.3)

## 2020-11-14 LAB — IGG, IGA, IGM
IgA: 20 mg/dL — ABNORMAL LOW (ref 61–437)
IgG (Immunoglobin G), Serum: 474 mg/dL — ABNORMAL LOW (ref 603–1613)
IgM (Immunoglobulin M), Srm: 35 mg/dL (ref 15–143)

## 2020-12-03 ENCOUNTER — Encounter: Payer: Self-pay | Admitting: Hematology & Oncology

## 2020-12-03 ENCOUNTER — Encounter: Payer: Self-pay | Admitting: Family

## 2020-12-10 ENCOUNTER — Telehealth: Payer: Self-pay

## 2020-12-10 ENCOUNTER — Other Ambulatory Visit: Payer: Self-pay

## 2020-12-10 ENCOUNTER — Inpatient Hospital Stay: Payer: Medicare Other | Attending: Hematology & Oncology

## 2020-12-10 ENCOUNTER — Inpatient Hospital Stay: Payer: Medicare Other

## 2020-12-10 ENCOUNTER — Other Ambulatory Visit: Payer: Self-pay | Admitting: Family

## 2020-12-10 ENCOUNTER — Encounter: Payer: Self-pay | Admitting: Hematology & Oncology

## 2020-12-10 ENCOUNTER — Other Ambulatory Visit (HOSPITAL_BASED_OUTPATIENT_CLINIC_OR_DEPARTMENT_OTHER): Payer: Self-pay

## 2020-12-10 ENCOUNTER — Inpatient Hospital Stay (HOSPITAL_BASED_OUTPATIENT_CLINIC_OR_DEPARTMENT_OTHER): Payer: Medicare Other | Admitting: Hematology & Oncology

## 2020-12-10 VITALS — BP 157/80 | HR 60 | Temp 97.5°F | Resp 18

## 2020-12-10 VITALS — BP 150/84 | HR 64 | Temp 98.0°F | Resp 18 | Wt 222.0 lb

## 2020-12-10 DIAGNOSIS — Z5112 Encounter for antineoplastic immunotherapy: Secondary | ICD-10-CM | POA: Diagnosis not present

## 2020-12-10 DIAGNOSIS — C9002 Multiple myeloma in relapse: Secondary | ICD-10-CM | POA: Diagnosis present

## 2020-12-10 DIAGNOSIS — M899 Disorder of bone, unspecified: Secondary | ICD-10-CM

## 2020-12-10 DIAGNOSIS — C9000 Multiple myeloma not having achieved remission: Secondary | ICD-10-CM

## 2020-12-10 DIAGNOSIS — Z79899 Other long term (current) drug therapy: Secondary | ICD-10-CM | POA: Insufficient documentation

## 2020-12-10 LAB — CMP (CANCER CENTER ONLY)
ALT: 12 U/L (ref 0–44)
AST: 16 U/L (ref 15–41)
Albumin: 3.9 g/dL (ref 3.5–5.0)
Alkaline Phosphatase: 46 U/L (ref 38–126)
Anion gap: 7 (ref 5–15)
BUN: 21 mg/dL (ref 8–23)
CO2: 30 mmol/L (ref 22–32)
Calcium: 10.1 mg/dL (ref 8.9–10.3)
Chloride: 103 mmol/L (ref 98–111)
Creatinine: 1.28 mg/dL — ABNORMAL HIGH (ref 0.61–1.24)
GFR, Estimated: 55 mL/min — ABNORMAL LOW (ref >60)
Glucose, Bld: 115 mg/dL — ABNORMAL HIGH (ref 70–99)
Potassium: 3.6 mmol/L (ref 3.5–5.1)
Sodium: 140 mmol/L (ref 135–145)
Total Bilirubin: 0.7 mg/dL (ref 0.3–1.2)
Total Protein: 5.8 g/dL — ABNORMAL LOW (ref 6.5–8.1)

## 2020-12-10 LAB — CBC WITH DIFFERENTIAL (CANCER CENTER ONLY)
Abs Immature Granulocytes: 0.01 10*3/uL (ref 0.00–0.07)
Basophils Absolute: 0 10*3/uL (ref 0.0–0.1)
Basophils Relative: 0 %
Eosinophils Absolute: 0.1 10*3/uL (ref 0.0–0.5)
Eosinophils Relative: 3 %
HCT: 31.9 % — ABNORMAL LOW (ref 39.0–52.0)
Hemoglobin: 10.7 g/dL — ABNORMAL LOW (ref 13.0–17.0)
Immature Granulocytes: 0 %
Lymphocytes Relative: 39 %
Lymphs Abs: 1.9 10*3/uL (ref 0.7–4.0)
MCH: 32.2 pg (ref 26.0–34.0)
MCHC: 33.5 g/dL (ref 30.0–36.0)
MCV: 96.1 fL (ref 80.0–100.0)
Monocytes Absolute: 0.5 10*3/uL (ref 0.1–1.0)
Monocytes Relative: 11 %
Neutro Abs: 2.2 10*3/uL (ref 1.7–7.7)
Neutrophils Relative %: 47 %
Platelet Count: 197 10*3/uL (ref 150–400)
RBC: 3.32 MIL/uL — ABNORMAL LOW (ref 4.22–5.81)
RDW: 13.3 % (ref 11.5–15.5)
WBC Count: 4.8 10*3/uL (ref 4.0–10.5)
nRBC: 0 % (ref 0.0–0.2)

## 2020-12-10 LAB — LACTATE DEHYDROGENASE: LDH: 192 U/L (ref 98–192)

## 2020-12-10 MED ORDER — ACETAMINOPHEN 325 MG PO TABS
ORAL_TABLET | ORAL | Status: AC
  Filled 2020-12-10: qty 2

## 2020-12-10 MED ORDER — DIPHENHYDRAMINE HCL 25 MG PO CAPS
50.0000 mg | ORAL_CAPSULE | Freq: Once | ORAL | Status: AC
  Administered 2020-12-10: 50 mg via ORAL

## 2020-12-10 MED ORDER — DARATUMUMAB-HYALURONIDASE-FIHJ 1800-30000 MG-UT/15ML ~~LOC~~ SOLN
1800.0000 mg | Freq: Once | SUBCUTANEOUS | Status: AC
  Administered 2020-12-10: 1800 mg via SUBCUTANEOUS
  Filled 2020-12-10: qty 15

## 2020-12-10 MED ORDER — MONTELUKAST SODIUM 10 MG PO TABS: 10.0000 mg | ORAL_TABLET | Freq: Every day | ORAL | 5 refills | Status: DC

## 2020-12-10 MED ORDER — DIPHENHYDRAMINE HCL 25 MG PO CAPS
ORAL_CAPSULE | ORAL | Status: AC
  Filled 2020-12-10: qty 2

## 2020-12-10 MED ORDER — SODIUM CHLORIDE 0.9% FLUSH
10.0000 mL | INTRAVENOUS | Status: DC | PRN
  Administered 2020-12-10: 10 mL
  Filled 2020-12-10: qty 10

## 2020-12-10 MED ORDER — SODIUM CHLORIDE 0.9 % IV SOLN
Freq: Once | INTRAVENOUS | Status: DC
  Filled 2020-12-10: qty 250

## 2020-12-10 MED ORDER — ACETAMINOPHEN 325 MG PO TABS
650.0000 mg | ORAL_TABLET | Freq: Once | ORAL | Status: AC
  Administered 2020-12-10: 650 mg via ORAL

## 2020-12-10 MED ORDER — DEXAMETHASONE 4 MG PO TABS
20.0000 mg | ORAL_TABLET | Freq: Once | ORAL | Status: AC
  Administered 2020-12-10: 20 mg via ORAL

## 2020-12-10 MED ORDER — HEPARIN SOD (PORK) LOCK FLUSH 100 UNIT/ML IV SOLN
500.0000 [IU] | Freq: Once | INTRAVENOUS | Status: AC | PRN
  Administered 2020-12-10: 500 [IU]
  Filled 2020-12-10: qty 5

## 2020-12-10 MED ORDER — DEXAMETHASONE 4 MG PO TABS
ORAL_TABLET | ORAL | Status: AC
  Filled 2020-12-10: qty 5

## 2020-12-10 MED FILL — Amoxicillin (Trihydrate) Cap 500 MG: ORAL | 28 days supply | Qty: 4 | Fill #1 | Status: AC

## 2020-12-10 NOTE — Telephone Encounter (Signed)
Appts made per 12/10/20 los, pt to gain sch at [facility] and as well through [employer]

## 2020-12-10 NOTE — Patient Instructions (Signed)
Implanted Port Home Guide An implanted port is a device that is placed under the skin. It is usually placed in the chest. The device can be used to give IV medicine, to take blood, or for dialysis. You may have an implanted port if: You need IV medicine that would be irritating to the small veins in your hands or arms. You need IV medicines, such as antibiotics, for a long period of time. You need IV nutrition for a long period of time. You need dialysis. When you have a port, your health care provider can choose to use the port instead of veins in your arms for these procedures. You may have fewer limitations when using a port than you would if you used other types of long-term IVs, and you will likely be able to return to normal activities afteryour incision heals. An implanted port has two main parts: Reservoir. The reservoir is the part where a needle is inserted to give medicines or draw blood. The reservoir is round. After it is placed, it appears as a small, raised area under your skin. Catheter. The catheter is a thin, flexible tube that connects the reservoir to a vein. Medicine that is inserted into the reservoir goes into the catheter and then into the vein. How is my port accessed? To access your port: A numbing cream may be placed on the skin over the port site. Your health care provider will put on a mask and sterile gloves. The skin over your port will be cleaned carefully with a germ-killing soap and allowed to dry. Your health care provider will gently pinch the port and insert a needle into it. Your health care provider will check for a blood return to make sure the port is in the vein and is not clogged. If your port needs to remain accessed to get medicine continuously (constant infusion), your health care provider will place a clear bandage (dressing) over the needle site. The dressing and needle will need to be changed every week, or as told by your health care provider. What  is flushing? Flushing helps keep the port from getting clogged. Follow instructions from your health care provider about how and when to flush the port. Ports are usually flushed with saline solution or a medicine called heparin. The need for flushing will depend on how the port is used: If the port is only used from time to time to give medicines or draw blood, the port may need to be flushed: Before and after medicines have been given. Before and after blood has been drawn. As part of routine maintenance. Flushing may be recommended every 4-6 weeks. If a constant infusion is running, the port may not need to be flushed. Throw away any syringes in a disposal container that is meant for sharp items (sharps container). You can buy a sharps container from a pharmacy, or you can make one by using an empty hard plastic bottle with a cover. How long will my port stay implanted? The port can stay in for as long as your health care provider thinks it is needed. When it is time for the port to come out, a surgery will be done to remove it. The surgery will be similar to the procedure that was done to putthe port in. Follow these instructions at home:  Flush your port as told by your health care provider. If you need an infusion over several days, follow instructions from your health care provider about how to take   care of your port site. Make sure you: Wash your hands with soap and water before you change your dressing. If soap and water are not available, use alcohol-based hand sanitizer. Change your dressing as told by your health care provider. Place any used dressings or infusion bags into a plastic bag. Throw that bag in the trash. Keep the dressing that covers the needle clean and dry. Do not get it wet. Do not use scissors or sharp objects near the tube. Keep the tube clamped, unless it is being used. Check your port site every day for signs of infection. Check for: Redness, swelling, or  pain. Fluid or blood. Pus or a bad smell. Protect the skin around the port site. Avoid wearing bra straps that rub or irritate the site. Protect the skin around your port from seat belts. Place a soft pad over your chest if needed. Bathe or shower as told by your health care provider. The site may get wet as long as you are not actively receiving an infusion. Return to your normal activities as told by your health care provider. Ask your health care provider what activities are safe for you. Carry a medical alert card or wear a medical alert bracelet at all times. This will let health care providers know that you have an implanted port in case of an emergency. Get help right away if: You have redness, swelling, or pain at the port site. You have fluid or blood coming from your port site. You have pus or a bad smell coming from the port site. You have a fever. Summary Implanted ports are usually placed in the chest for long-term IV access. Follow instructions from your health care provider about flushing the port and changing bandages (dressings). Take care of the area around your port by avoiding clothing that puts pressure on the area, and by watching for signs of infection. Protect the skin around your port from seat belts. Place a soft pad over your chest if needed. Get help right away if you have a fever or you have redness, swelling, pain, drainage, or a bad smell at the port site. This information is not intended to replace advice given to you by your health care provider. Make sure you discuss any questions you have with your healthcare provider. Document Revised: 09/25/2019 Document Reviewed: 09/25/2019 Elsevier Patient Education  2022 Elsevier Inc.  

## 2020-12-10 NOTE — Progress Notes (Signed)
Pt. refused to wait one hour post injection. Released stable and ASX.

## 2020-12-10 NOTE — Patient Instructions (Signed)
Daratumumab injection What is this medication? DARATUMUMAB (dar a toom ue mab) is a monoclonal antibody. It is used to treat multiple myeloma. This medicine may be used for other purposes; ask your health care provider or pharmacist if you have questions. COMMON BRAND NAME(S): DARZALEX What should I tell my care team before I take this medication? They need to know if you have any of these conditions: hereditary fructose intolerance infection (especially a virus infection such as chickenpox, herpes, or hepatitis B virus) lung or breathing disease (asthma, COPD) an unusual or allergic reaction to daratumumab, sorbitol, other medicines, foods, dyes, or preservatives pregnant or trying to get pregnant breast-feeding How should I use this medication? This medicine is for infusion into a vein. It is given by a health care professional in a hospital or clinic setting. Talk to your pediatrician regarding the use of this medicine in children. Special care may be needed. Overdosage: If you think you have taken too much of this medicine contact a poison control center or emergency room at once. NOTE: This medicine is only for you. Do not share this medicine with others. What if I miss a dose? Keep appointments for follow-up doses as directed. It is important not to miss your dose. Call your doctor or health care professional if you are unable to keep an appointment. What may interact with this medication? Interactions have not been studied. This list may not describe all possible interactions. Give your health care provider a list of all the medicines, herbs, non-prescription drugs, or dietary supplements you use. Also tell them if you smoke, drink alcohol, or use illegal drugs. Some items may interact with your medicine. What should I watch for while using this medication? Your condition will be monitored carefully while you are receiving this medicine. This medicine can cause serious allergic  reactions. To reduce your risk, your health care provider may give you other medicine to take before receiving this one. Be sure to follow the directions from your health care provider. This medicine can affect the results of blood tests to match your blood type. These changes can last for up to 6 months after the final dose. Your healthcare provider will do blood tests to match your blood type before you start treatment. Tell all of your healthcare providers that you are being treated with this medicine before receiving a blood transfusion. This medicine can affect the results of some tests used to determine treatment response; extra tests may be needed to evaluate response. Do not become pregnant while taking this medicine or for 3 months after stopping it. Women should inform their health care provider if they wish to become pregnant or think they might be pregnant. There is a potential for serious side effects to an unborn child. Talk to your health care provider for more information. Do not breast-feed an infant while taking this medicine. What side effects may I notice from receiving this medication? Side effects that you should report to your doctor or health care professional as soon as possible: allergic reactions (skin rash, itching, hives, swelling of the face, lips, or tongue) blurred vision infection (fever, chills, cough, sore throat, pain or difficulty passing urine) infusion reaction (dizziness, fast heartbeat, feeling faint or lightheaded, falls, headache, increase in blood pressure, nausea, vomiting, or wheezing or trouble breathing with loud or whistling sounds) unusual bleeding or bruising Side effects that usually do not require medical attention (report to your doctor or health care professional if they continue or are bothersome):   constipation diarrhea pain, tingling, numbness in the hands or feet swelling of the ankles, feet, hands tiredness This list may not describe all  possible side effects. Call your doctor for medical advice about side effects. You may report side effects to FDA at 1-800-FDA-1088. Where should I keep my medication? This drug is given in a hospital or clinic and will not be stored at home. NOTE: This sheet is a summary. It may not cover all possible information. If you have questions about this medicine, talk to your doctor, pharmacist, or health care provider.  2022 Elsevier/Gold Standard (2020-06-20 12:50:38)  

## 2020-12-10 NOTE — Progress Notes (Signed)
Hematology and Oncology Follow Up Visit  Robert [name] [id number] May 27, 1933 87 y.o. 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 Aranesp 300 mcg subcu q. 3 weeks for hemoglobin less than 10   Interim History:  [name] is here today for follow-up and treatment.  He is still being quite cautious with respect to the coronavirus.  He is wife really do not do a lot outside.  He has been to [employer].  I think he has some property over in that small town.  He is done quite well with the daratumumab.  There is no monoclonal spike in his blood when we saw him in June.  His IgG level was 474 mg/dL.  His kappa light chain was 1.1 mg/dL.  His weight has not come down.  He is trying to lose weight.  Again he is trying to watch what he eats.  Not sure his wife is doing a good job with this.  He has had no change in bowel or bladder habits.  He has had no issues with cough.  He has had no shortness of breath.  He has had no rashes..  Has little bit of swelling in his legs.  This is always been noted.  Overall, his performance status is ECOG 1.    Medications:  Allergies as of 12/10/2020   No Known Allergies      Medication List        Accurate as of December 10, 2020  9:23 AM. If you have any questions, ask your nurse or doctor.          amoxicillin 500 MG capsule Commonly known as: AMOXIL TAKE 4 CAPSULES (2,000 MG TOTAL) BY MOUTH ONCE FOR 1 DOSE. TAKE 1 HOUR BEFORE DENTAL WORK   aspirin 81 MG tablet Take 81 mg by mouth daily.   bimatoprost 0.01 %  Soln Commonly known as: LUMIGAN Place 1 drop into both eyes at bedtime.   CALTRATE 600+D PLUS PO Take 1 tablet by mouth daily.   ciclopirox 0.77 % cream Commonly known as: LOPROX   cloNIDine 0.1 MG tablet Commonly known as: CATAPRES Take 0.1 mg by mouth 2 (two) times daily as needed.   cyanocobalamin 2000 MCG tablet Take 2,000 mcg by mouth at bedtime.   famciclovir 250 MG tablet Commonly known as: FAMVIR TAKE 1 TABLET DAILY   furosemide 20 MG tablet Commonly known as: LASIX Take 2 tablets (40 mg total) by mouth daily.   losartan 100 MG tablet Commonly known as: COZAAR Take 100 mg by mouth daily.   metolazone 5 MG tablet Commonly known as: ZAROXOLYN TAKE 1 TABLET 1 HOUR PRIOR TO TAKING LASIX DAILY   montelukast 10 MG tablet Commonly known as: Singulair Take 1 tablet (10 mg total) by mouth at bedtime. What changed:  when to take this additional instructions   multivitamin with minerals Tabs tablet Take 1 tablet by mouth daily.   potassium chloride SA 20 MEQ tablet Commonly known as: KLOR-CON Take 20  mEq by mouth daily.   rosuvastatin 20 MG tablet Commonly known as: CRESTOR Take 20 mg by mouth at bedtime.   SYSTANE OP Place 1 drop into both eyes daily as needed (dry eyes).   Vitamin D3 25 MCG (1000 UT) Caps Take 1,000 Units by mouth daily.        Allergies: No Known Allergies  Past Medical History, Surgical history, Social history, and Family History were reviewed and updated.  Review of Systems: Review of Systems  Constitutional: Negative.   HENT: Negative.    Eyes: Negative.   Respiratory: Negative.    Cardiovascular: Negative.   Gastrointestinal: Negative.   Genitourinary: Negative.   Musculoskeletal: Negative.   Skin: Negative.   Neurological: Negative.   Endo/Heme/Allergies: Negative.   Psychiatric/Behavioral: Negative.       Physical Exam:  weight is 222 lb (100.7 kg). His oral temperature is 98 F (36.7 C). His blood pressure is  150/84 (abnormal) and his pulse is 64. His respiration is 18 and oxygen saturation is 99%.   Wt Readings from Last 3 Encounters:  12/10/20 222 lb (100.7 kg)  11/13/20 220 lb 1.3 oz (99.8 kg)  10/15/20 221 lb (100.2 kg)    Physical Exam Vitals reviewed.  HENT:     Head: Normocephalic and atraumatic.  Eyes:     Pupils: Pupils are equal, round, and reactive to light.  Cardiovascular:     Rate and Rhythm: Normal rate and regular rhythm.     Heart sounds: Normal heart sounds.  Pulmonary:     Effort: Pulmonary effort is normal.     Breath sounds: Normal breath sounds.  Abdominal:     General: Bowel sounds are normal.     Palpations: Abdomen is soft.  Musculoskeletal:        General: No tenderness or deformity. Normal range of motion.     Cervical back: Normal range of motion.  Lymphadenopathy:     Cervical: No cervical adenopathy.  Skin:    General: Skin is warm and dry.     Findings: No erythema or rash.  Neurological:     Mental Status: He is alert and oriented to person, place, and time.  Psychiatric:        Behavior: Behavior normal.        Thought Content: Thought content normal.        Judgment: Judgment normal.     Lab Results  Component Value Date   WBC 4.8 12/10/2020   HGB 10.7 (L) 12/10/2020   HCT 31.9 (L) 12/10/2020   MCV 96.1 12/10/2020   PLT 197 12/10/2020   Lab Results  Component Value Date   FERRITIN 1,061 (H) 06/28/2019   IRON 82 06/28/2019   TIBC 281 06/28/2019   UIBC 200 06/28/2019   IRONPCTSAT 29 06/28/2019   Lab Results  Component Value Date   RETICCTPCT 2.2 04/05/2019   RBC 3.32 (L) 12/10/2020   Lab Results  Component Value Date   KPAFRELGTCHN 11.4 11/13/2020   LAMBDASER 6.8 11/13/2020   KAPLAMBRATIO 1.68 (H) 11/13/2020   Lab Results  Component Value Date   IGGSERUM 474 (L) 11/13/2020   IGA 20 (L) 11/13/2020   IGMSERUM 35 11/13/2020   Lab Results  Component Value Date   TOTALPROTELP 5.7 (L) 11/13/2020   ALBUMINELP 3.7  11/13/2020   A1GS 0.2 11/13/2020   A2GS 0.8 11/13/2020   BETS 0.7 11/13/2020   BETA2SER 0.3 01/03/2015   GAMS 0.4 11/13/2020   MSPIKE Not Observed 11/13/2020  SPEI Comment 06/26/2020     Chemistry      Component Value Date/Time   NA 139 11/13/2020 0815   NA 137 04/08/2017 1141   NA 138 10/29/2015 1029   K 3.8 11/13/2020 0815   K 3.4 04/08/2017 1141   K 3.9 10/29/2015 1029   CL 104 11/13/2020 0815   CL 102 04/08/2017 1141   CO2 28 11/13/2020 0815   CO2 30 04/08/2017 1141   CO2 27 10/29/2015 1029   BUN 19 11/13/2020 0815   BUN 16 04/08/2017 1141   BUN 13.8 10/29/2015 1029   CREATININE 1.21 11/13/2020 0815   CREATININE 1.3 (H) 04/08/2017 1141   CREATININE 1.1 10/29/2015 1029      Component Value Date/Time   CALCIUM 9.9 11/13/2020 0815   CALCIUM 9.3 04/08/2017 1141   CALCIUM 9.3 10/29/2015 1029   ALKPHOS 46 11/13/2020 0815   ALKPHOS 66 04/08/2017 1141   ALKPHOS 52 10/29/2015 1029   AST 14 (L) 11/13/2020 0815   AST 20 10/29/2015 1029   ALT 11 11/13/2020 0815   ALT 21 04/08/2017 1141   ALT 14 10/29/2015 1029   BILITOT 0.7 11/13/2020 0815   BILITOT 0.75 10/29/2015 1029       Impression and Plan: Mr. Redmann is a very pleasant 87 yo African American gentleman with relapsed IgG kappa myeloma. He has history of stem cell transplant in 2006.   So far, everything seems to be doing nicely.  By his last myeloma studies, there is no obvious active myeloma.   The daratumumab clearly is helping him out.  He has a good quality of life.  I just wish he would be a little more active and lose some weight.  As always, we will plan for another follow-up in 4 weeks.   Volanda Napoleon, MD 7/19/20229:23 AM

## 2020-12-11 LAB — IGG, IGA, IGM
IgA: 19 mg/dL — ABNORMAL LOW (ref 61–437)
IgG (Immunoglobin G), Serum: 460 mg/dL — ABNORMAL LOW (ref 603–1613)
IgM (Immunoglobulin M), Srm: 25 mg/dL (ref 15–143)

## 2020-12-11 LAB — KAPPA/LAMBDA LIGHT CHAINS
Kappa free light chain: 9.8 mg/L (ref 3.3–19.4)
Kappa, lambda light chain ratio: 1.24 (ref 0.26–1.65)
Lambda free light chains: 7.9 mg/L (ref 5.7–26.3)

## 2020-12-12 LAB — PROTEIN ELECTROPHORESIS, SERUM, WITH REFLEX
A/G Ratio: 1.8 — ABNORMAL HIGH (ref 0.7–1.7)
Albumin ELP: 3.5 g/dL (ref 2.9–4.4)
Alpha-1-Globulin: 0.2 g/dL (ref 0.0–0.4)
Alpha-2-Globulin: 0.8 g/dL (ref 0.4–1.0)
Beta Globulin: 0.7 g/dL (ref 0.7–1.3)
Gamma Globulin: 0.4 g/dL (ref 0.4–1.8)
Globulin, Total: 2 g/dL — ABNORMAL LOW (ref 2.2–3.9)
Total Protein ELP: 5.5 g/dL — ABNORMAL LOW (ref 6.0–8.5)

## 2021-01-07 ENCOUNTER — Inpatient Hospital Stay: Payer: Medicare Other

## 2021-01-07 ENCOUNTER — Inpatient Hospital Stay: Payer: Medicare Other | Attending: Hematology & Oncology

## 2021-01-07 ENCOUNTER — Encounter: Payer: Self-pay | Admitting: Hematology & Oncology

## 2021-01-07 ENCOUNTER — Inpatient Hospital Stay (HOSPITAL_BASED_OUTPATIENT_CLINIC_OR_DEPARTMENT_OTHER): Payer: Medicare Other | Admitting: Hematology & Oncology

## 2021-01-07 ENCOUNTER — Other Ambulatory Visit: Payer: Self-pay

## 2021-01-07 VITALS — Wt 220.0 lb

## 2021-01-07 VITALS — BP 158/80 | HR 50

## 2021-01-07 VITALS — BP 176/86 | HR 60 | Temp 97.7°F | Resp 18

## 2021-01-07 DIAGNOSIS — C9000 Multiple myeloma not having achieved remission: Secondary | ICD-10-CM

## 2021-01-07 DIAGNOSIS — Z79899 Other long term (current) drug therapy: Secondary | ICD-10-CM | POA: Insufficient documentation

## 2021-01-07 DIAGNOSIS — D630 Anemia in neoplastic disease: Secondary | ICD-10-CM | POA: Diagnosis not present

## 2021-01-07 DIAGNOSIS — C9002 Multiple myeloma in relapse: Secondary | ICD-10-CM | POA: Insufficient documentation

## 2021-01-07 DIAGNOSIS — M899 Disorder of bone, unspecified: Secondary | ICD-10-CM

## 2021-01-07 DIAGNOSIS — M898X9 Other specified disorders of bone, unspecified site: Secondary | ICD-10-CM

## 2021-01-07 DIAGNOSIS — N183 Chronic kidney disease, stage 3 unspecified: Secondary | ICD-10-CM

## 2021-01-07 DIAGNOSIS — D631 Anemia in chronic kidney disease: Secondary | ICD-10-CM

## 2021-01-07 DIAGNOSIS — Z5112 Encounter for antineoplastic immunotherapy: Secondary | ICD-10-CM | POA: Insufficient documentation

## 2021-01-07 DIAGNOSIS — Z95828 Presence of other vascular implants and grafts: Secondary | ICD-10-CM

## 2021-01-07 LAB — CBC WITH DIFFERENTIAL (CANCER CENTER ONLY)
Abs Immature Granulocytes: 0.01 10*3/uL (ref 0.00–0.07)
Basophils Absolute: 0 10*3/uL (ref 0.0–0.1)
Basophils Relative: 1 %
Eosinophils Absolute: 0.2 10*3/uL (ref 0.0–0.5)
Eosinophils Relative: 3 %
HCT: 33 % — ABNORMAL LOW (ref 39.0–52.0)
Hemoglobin: 11.1 g/dL — ABNORMAL LOW (ref 13.0–17.0)
Immature Granulocytes: 0 %
Lymphocytes Relative: 39 %
Lymphs Abs: 2 10*3/uL (ref 0.7–4.0)
MCH: 32.2 pg (ref 26.0–34.0)
MCHC: 33.6 g/dL (ref 30.0–36.0)
MCV: 95.7 fL (ref 80.0–100.0)
Monocytes Absolute: 0.6 10*3/uL (ref 0.1–1.0)
Monocytes Relative: 10 %
Neutro Abs: 2.5 10*3/uL (ref 1.7–7.7)
Neutrophils Relative %: 47 %
Platelet Count: 179 10*3/uL (ref 150–400)
RBC: 3.45 MIL/uL — ABNORMAL LOW (ref 4.22–5.81)
RDW: 13.2 % (ref 11.5–15.5)
WBC Count: 5.3 10*3/uL (ref 4.0–10.5)
nRBC: 0 % (ref 0.0–0.2)

## 2021-01-07 LAB — CMP (CANCER CENTER ONLY)
ALT: 12 U/L (ref 0–44)
AST: 15 U/L (ref 15–41)
Albumin: 3.8 g/dL (ref 3.5–5.0)
Alkaline Phosphatase: 42 U/L (ref 38–126)
Anion gap: 6 (ref 5–15)
BUN: 17 mg/dL (ref 8–23)
CO2: 30 mmol/L (ref 22–32)
Calcium: 9.7 mg/dL (ref 8.9–10.3)
Chloride: 104 mmol/L (ref 98–111)
Creatinine: 1.12 mg/dL (ref 0.61–1.24)
GFR, Estimated: >60 mL/min (ref >60)
Glucose, Bld: 94 mg/dL (ref 70–99)
Potassium: 3.6 mmol/L (ref 3.5–5.1)
Sodium: 140 mmol/L (ref 135–145)
Total Bilirubin: 0.8 mg/dL (ref 0.3–1.2)
Total Protein: 5.2 g/dL — ABNORMAL LOW (ref 6.5–8.1)

## 2021-01-07 LAB — LACTATE DEHYDROGENASE: LDH: 201 U/L — ABNORMAL HIGH (ref 98–192)

## 2021-01-07 MED ORDER — HEPARIN SOD (PORK) LOCK FLUSH 100 UNIT/ML IV SOLN
500.0000 [IU] | Freq: Once | INTRAVENOUS | Status: AC
  Administered 2021-01-07: 500 [IU] via INTRAVENOUS

## 2021-01-07 MED ORDER — ACETAMINOPHEN 325 MG PO TABS
650.0000 mg | ORAL_TABLET | Freq: Once | ORAL | Status: AC
  Administered 2021-01-07: 650 mg via ORAL
  Filled 2021-01-07: qty 2

## 2021-01-07 MED ORDER — SODIUM CHLORIDE 0.9% FLUSH
10.0000 mL | Freq: Once | INTRAVENOUS | Status: AC
  Administered 2021-01-07: 10 mL via INTRAVENOUS

## 2021-01-07 MED ORDER — DEXAMETHASONE 4 MG PO TABS
20.0000 mg | ORAL_TABLET | Freq: Once | ORAL | Status: AC
  Administered 2021-01-07: 20 mg via ORAL
  Filled 2021-01-07: qty 5

## 2021-01-07 MED ORDER — DIPHENHYDRAMINE HCL 25 MG PO CAPS
50.0000 mg | ORAL_CAPSULE | Freq: Once | ORAL | Status: AC
  Administered 2021-01-07: 50 mg via ORAL
  Filled 2021-01-07: qty 2

## 2021-01-07 MED ORDER — SODIUM CHLORIDE 0.9 % IV SOLN: Freq: Once | INTRAVENOUS | Status: AC

## 2021-01-07 MED ORDER — ZOLEDRONIC ACID 4 MG/100ML IV SOLN
4.0000 mg | Freq: Once | INTRAVENOUS | Status: AC
  Administered 2021-01-07: 4 mg via INTRAVENOUS
  Filled 2021-01-07: qty 100

## 2021-01-07 MED ORDER — DARATUMUMAB-HYALURONIDASE-FIHJ 1800-30000 MG-UT/15ML ~~LOC~~ SOLN
1800.0000 mg | Freq: Once | SUBCUTANEOUS | Status: AC
  Administered 2021-01-07: 1800 mg via SUBCUTANEOUS
  Filled 2021-01-07: qty 15

## 2021-01-07 NOTE — Progress Notes (Signed)
Hematology and Oncology Follow Up Visit  Robert [name] [id number] October 31, 1933 87 y.o. 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 Aranesp 300 mcg subcu q. 3 weeks for hemoglobin less than 10   Interim History:  Robert [name] is here today for follow-up and treatment.  He is still being quite cautious with respect to the coronavirus.  I think his wife just had cataract surgery.  They are both being cautious with respect to the coronavirus.  They do not like going outside a lot.  He has done well with the daratumumab.  So far, there has been no monoclonal spike in his blood.  His last IgG level was 460 mg/dL.  The Kappa light chain was 0.8 mg/dL.  His appetite is doing okay.  His weight has not yet gone down.  I suppose in all that surprised by this.  He does have a very vigorous appetite.  He has had no problems with bowels or bladder.  There is been no diarrhea.  He has had no rashes.  There has been no leg swelling.  Overall, his performance status is ECOG 1.    Medications:  Allergies as of 01/07/2021   No Known Allergies      Medication List        Accurate as of January 07, 2021  9:22 AM. If you have any questions, ask your nurse or doctor.          amoxicillin 500 MG capsule Commonly known as: AMOXIL TAKE 4 CAPSULES (2,000 MG TOTAL) BY MOUTH ONCE FOR 1 DOSE. TAKE 1 HOUR BEFORE DENTAL WORK   aspirin 81 MG tablet Take 81 mg by mouth daily.   bimatoprost 0.01 % Soln Commonly known as: LUMIGAN Place 1 drop into both eyes at bedtime.    CALTRATE 600+D PLUS PO Take 1 tablet by mouth daily.   ciclopirox 0.77 % cream Commonly known as: LOPROX   cloNIDine 0.1 MG tablet Commonly known as: CATAPRES Take 0.1 mg by mouth 2 (two) times daily as needed.   cyanocobalamin 2000 MCG tablet Take 2,000 mcg by mouth at bedtime.   famciclovir 250 MG tablet Commonly known as: FAMVIR TAKE 1 TABLET DAILY   furosemide 20 MG tablet Commonly known as: LASIX Take 2 tablets (40 mg total) by mouth daily.   losartan 100 MG tablet Commonly known as: COZAAR Take 100 mg by mouth daily.   metolazone 5 MG tablet Commonly known as: ZAROXOLYN TAKE 1 TABLET 1 HOUR PRIOR TO TAKING LASIX DAILY   montelukast 10 MG tablet Commonly known as: Singulair Take 1 tablet (10 mg total) by mouth at bedtime.   multivitamin with minerals Tabs tablet Take 1 tablet by mouth daily.   potassium chloride SA 20 MEQ tablet Commonly known as: KLOR-CON Take 20 mEq by mouth daily.   rosuvastatin 20 MG tablet Commonly known as: CRESTOR Take 20 mg by mouth at bedtime.   SYSTANE OP Place 1 drop into both eyes daily as needed (dry eyes).  Vitamin D3 25 MCG (1000 UT) Caps Take 1,000 Units by mouth daily.        Allergies: No Known Allergies  Past Medical History, Surgical history, Social history, and Family History were reviewed and updated.  Review of Systems: Review of Systems  Constitutional: Negative.   HENT: Negative.    Eyes: Negative.   Respiratory: Negative.    Cardiovascular: Negative.   Gastrointestinal: Negative.   Genitourinary: Negative.   Musculoskeletal: Negative.   Skin: Negative.   Neurological: Negative.   Endo/Heme/Allergies: Negative.   Psychiatric/Behavioral: Negative.       Physical Exam:  weight is 220 lb (99.8 kg).   Wt Readings from Last 3 Encounters:  01/07/21 220 lb (99.8 kg)  12/10/20 222 lb (100.7 kg)  11/13/20 220 lb 1.3 oz (99.8 kg)    Physical Exam Vitals reviewed.  HENT:     Head:  Normocephalic and atraumatic.  Eyes:     Pupils: Pupils are equal, round, and reactive to light.  Cardiovascular:     Rate and Rhythm: Normal rate and regular rhythm.     Heart sounds: Normal heart sounds.  Pulmonary:     Effort: Pulmonary effort is normal.     Breath sounds: Normal breath sounds.  Abdominal:     General: Bowel sounds are normal.     Palpations: Abdomen is soft.  Musculoskeletal:        General: No tenderness or deformity. Normal range of motion.     Cervical back: Normal range of motion.  Lymphadenopathy:     Cervical: No cervical adenopathy.  Skin:    General: Skin is warm and dry.     Findings: No erythema or rash.  Neurological:     Mental Status: He is alert and oriented to person, place, and time.  Psychiatric:        Behavior: Behavior normal.        Thought Content: Thought content normal.        Judgment: Judgment normal.     Lab Results  Component Value Date   WBC 5.3 01/07/2021   HGB 11.1 (L) 01/07/2021   HCT 33.0 (L) 01/07/2021   MCV 95.7 01/07/2021   PLT 179 01/07/2021   Lab Results  Component Value Date   FERRITIN 1,061 (H) 06/28/2019   IRON 82 06/28/2019   TIBC 281 06/28/2019   UIBC 200 06/28/2019   IRONPCTSAT 29 06/28/2019   Lab Results  Component Value Date   RETICCTPCT 2.2 04/05/2019   RBC 3.45 (L) 01/07/2021   Lab Results  Component Value Date   KPAFRELGTCHN 9.8 12/10/2020   LAMBDASER 7.9 12/10/2020   KAPLAMBRATIO 1.24 12/10/2020   Lab Results  Component Value Date   IGGSERUM 460 (L) 12/10/2020   IGA 19 (L) 12/10/2020   IGMSERUM 25 12/10/2020   Lab Results  Component Value Date   TOTALPROTELP 5.5 (L) 12/10/2020   ALBUMINELP 3.5 12/10/2020   A1GS 0.2 12/10/2020   A2GS 0.8 12/10/2020   BETS 0.7 12/10/2020   BETA2SER 0.3 01/03/2015   GAMS 0.4 12/10/2020   MSPIKE Not Observed 12/10/2020   SPEI Comment 06/26/2020     Chemistry      Component Value Date/Time   NA 140 01/07/2021 0821   NA 137 04/08/2017 1141    NA 138 10/29/2015 1029   K 3.6 01/07/2021 0821   K 3.4 04/08/2017 1141   K 3.9 10/29/2015 1029   CL 104 01/07/2021 0821   CL 102 04/08/2017 1141  CO2 30 01/07/2021 0821   CO2 30 04/08/2017 1141   CO2 27 10/29/2015 1029   BUN 17 01/07/2021 0821   BUN 16 04/08/2017 1141   BUN 13.8 10/29/2015 1029   CREATININE 1.12 01/07/2021 0821   CREATININE 1.3 (H) 04/08/2017 1141   CREATININE 1.1 10/29/2015 1029      Component Value Date/Time   CALCIUM 9.7 01/07/2021 0821   CALCIUM 9.3 04/08/2017 1141   CALCIUM 9.3 10/29/2015 1029   ALKPHOS 42 01/07/2021 0821   ALKPHOS 66 04/08/2017 1141   ALKPHOS 52 10/29/2015 1029   AST 15 01/07/2021 0821   AST 20 10/29/2015 1029   ALT 12 01/07/2021 0821   ALT 21 04/08/2017 1141   ALT 14 10/29/2015 1029   BILITOT 0.8 01/07/2021 0821   BILITOT 0.75 10/29/2015 1029       Impression and Plan: Mr. Sweet is a very pleasant 87 yo African American gentleman with relapsed IgG kappa myeloma. He has history of stem cell transplant in 2006.   So far, everything seems to be doing nicely.  By his last myeloma studies, there is no obvious active myeloma.   The daratumumab clearly is helping him out.  He has a good quality of life.  I just wish he would be a little more active and lose some weight.  As always, we will plan for another follow-up in 4 weeks.   Volanda Napoleon, MD 8/16/20229:22 AM

## 2021-01-07 NOTE — Patient Instructions (Signed)
Tunneled Central Venous Catheter Flushing Guide It is important to flush your tunneled central venous catheter each time you use it, both before and after you use it. Flushing your catheter will help prevent it from clogging. What are the risks? Risks may include: Infection. Air getting into the catheter and bloodstream. Supplies needed: A clean pair of gloves. A disinfecting wipe. Use an alcohol wipe, chlorhexidine wipe, or iodine wipe as told by your health care provider. A 10 mL syringe that has been prefilled with saline solution. An empty 10 mL syringe, if a substance called heparin was injected into your catheter. How to flush your catheter When you flush your catheter, make sure you follow any specific instructions from your health care provider or the manufacturer. These are general guidelines. Flushing your catheter before use If there is heparin in your catheter: Wash your hands with soap and water. Put on gloves. Scrub the injection cap for a minimum of 15 seconds with a disinfecting wipe. Unclamp the catheter. Attach the empty syringe to the injection cap. Pull the syringe plunger back and withdraw 10 mL of blood. Place the syringe into an appropriate waste container. Scrub the injection cap for 15 seconds with a disinfecting wipe. Attach the prefilled syringe to the injection cap. Flush the catheter by pushing the plunger forward until all the liquid from the syringe is in the catheter. Remove the syringe from the injection cap. Clamp the catheter. If there is no heparin in your catheter: Wash your hands with soap and water. Put on gloves. Scrub the injection cap for 15 seconds with a disinfecting wipe. Unclamp the catheter. Attach the prefilled syringe to the injection cap. Flush the catheter by pushing the plunger forward until 5 mL of the liquid from the syringe is in the catheter. Pull back on the syringe until you see blood in the catheter. If you have been asked  to collect any blood, follow your health care provider's instructions. Otherwise, flush the catheter with the rest of the solution from the syringe. Remove the syringe from the injection cap. Clamp the catheter.  Flushing your catheter after use Wash your hands with soap and water. Put on gloves. Scrub the injection cap for 15 seconds with a disinfecting wipe. Unclamp the catheter. Attach the prefilled syringe to the injection cap. Flush the catheter by pushing the plunger forward until all of the liquid from the syringe is in the catheter. Remove the syringe from the injection cap. Clamp the catheter. Problems and solutions If blood cannot be completely cleared from the injection cap, you may need to have the injection cap replaced. If the catheter is difficult to flush, use the pulsing method. The pulsing method involves pushing only a few milliliters of solution into the catheter at a time and pausing between pushes. If you do not see blood in the catheter when you pull back on the syringe, change your body position, such as by raising your arms above your head. Take a deep breath and cough. Then, pull back on the syringe. If you still do not see blood, flush the catheter with a small amount of solution. Then, change positions again and take a breath or cough. Pull back on the syringe again. If you still do not see blood, finish flushing the catheter and contact your health care provider. Do not use your catheter until your health care provider says it is okay. General tips Have someone help you flush your catheter, if possible. Do not force fluid   through your catheter. Do not use a syringe that is larger or smaller than 10 mL. Using a smaller syringe can make the catheter burst. Do not use your catheter without flushing it first if it has heparin in it. Contact a health care provider if: You cannot see any blood in the catheter when you flush it before using it. Your catheter is difficult  to flush. Get help right away if: You cannot flush the catheter. The catheter leaks when you flush it or when there is fluid in it. There are cracks or breaks in the catheter. Summary It is important to flush your tunneled central venous catheter each time you use it, both before and after you use it. Scrub the injection cap for 15 seconds with a disinfecting wipe before and after you flush it. When you flush your catheter, make sure you follow any specific instructions from your health care provider or the manufacturer. Get help right away if you cannot flush the catheter. This information is not intended to replace advice given to you by your health care provider. Make sure you discuss any questions you have with your health care provider. Document Revised: 07/20/2019 Document Reviewed: 07/27/2018 Elsevier Patient Education  2022 [employer]  

## 2021-01-07 NOTE — Patient Instructions (Signed)
Daratumumab injection What is this medication? DARATUMUMAB (dar a toom ue mab) is a monoclonal antibody. It is used to treatmultiple myeloma. This medicine may be used for other purposes; ask your health care provider orpharmacist if you have questions. COMMON BRAND NAME(S): DARZALEX What should I tell my care team before I take this medication? They need to know if you have any of these conditions: hereditary fructose intolerance infection (especially a virus infection such as chickenpox, herpes, or hepatitis B virus) lung or breathing disease (asthma, COPD) an unusual or allergic reaction to daratumumab, sorbitol, other medicines, foods, dyes, or preservatives pregnant or trying to get pregnant breast-feeding How should I use this medication? This medicine is for infusion into a vein. It is given by a health careprofessional in a hospital or clinic setting. Talk to your pediatrician regarding the use of this medicine in children.Special care may be needed. Overdosage: If you think you have taken too much of this medicine contact apoison control center or emergency room at once. NOTE: This medicine is only for you. Do not share this medicine with others. What if I miss a dose? Keep appointments for follow-up doses as directed. It is important not to miss your dose. Call your doctor or health care professional if you are unable tokeep an appointment. What may interact with this medication? Interactions have not been studied. This list may not describe all possible interactions. Give your health care provider a list of all the medicines, herbs, non-prescription drugs, or dietary supplements you use. Also tell them if you smoke, drink alcohol, or use illegaldrugs. Some items may interact with your medicine. What should I watch for while using this medication? Your condition will be monitored carefully while you are receiving thismedicine. This medicine can cause serious allergic reactions. To  reduce your risk, your health care provider may give you other medicine to take before receiving thisone. Be sure to follow the directions from your health care provider. This medicine can affect the results of blood tests to match your blood type. These changes can last for up to 6 months after the final dose. Your healthcare provider will do blood tests to match your blood type before you start treatment. Tell all of your healthcare providers that you are being treatedwith this medicine before receiving a blood transfusion. This medicine can affect the results of some tests used to determine treatmentresponse; extra tests may be needed to evaluate response. Do not become pregnant while taking this medicine or for 3 months after stopping it. Women should inform their health care provider if they wish to become pregnant or think they might be pregnant. There is a potential for serious side effects to an unborn child. Talk to your health care provider formore information. Do not breast-feed an infant while taking this medicine. What side effects may I notice from receiving this medication? Side effects that you should report to your doctor or health care professionalas soon as possible: allergic reactions (skin rash, itching, hives, swelling of the face, lips, or tongue) blurred vision infection (fever, chills, cough, sore throat, pain or difficulty passing urine) infusion reaction (dizziness, fast heartbeat, feeling faint or lightheaded, falls, headache, increase in blood pressure, nausea, vomiting, or wheezing or trouble breathing with loud or whistling sounds) unusual bleeding or bruising Side effects that usually do not require medical attention (report to yourdoctor or health care professional if they continue or are bothersome): constipation diarrhea pain, tingling, numbness in the hands or feet swelling of the ankles,  feet, hands tiredness This list may not describe all possible side effects.  Call your doctor for medical advice about side effects. You may report side effects to FDA at1-800-FDA-1088. Where should I keep my medication? This drug is given in a hospital or clinic and will not be stored at home. NOTE: This sheet is a summary. It may not cover all possible information. If you have questions about this medicine, talk to your doctor, pharmacist, orhealth care provider.  2022 Elsevier/Gold Standard (2020-06-20 12:50:38)      Zoledronic Acid Injection (Hypercalcemia, Oncology) What is this medication? ZOLEDRONIC ACID (ZOE le dron ik AS id) slows calcium loss from bones. It high calcium levels in the blood from some kinds of cancer. It may be used in otherpeople at risk for bone loss. This medicine may be used for other purposes; ask your health care provider orpharmacist if you have questions. COMMON BRAND NAME(S): Zometa What should I tell my care team before I take this medication? They need to know if you have any of these conditions: cancer dehydration dental disease kidney disease liver disease low levels of calcium in the blood lung or breathing disease (asthma) receiving steroids like dexamethasone or prednisone an unusual or allergic reaction to zoledronic acid, other medicines, foods, dyes, or preservatives pregnant or trying to get pregnant breast-feeding How should I use this medication? This drug is injected into a vein. It is given by a health care provider in [place] or clinic setting. Talk to your health care provider about the use of this drug in children.Special care may be needed. Overdosage: If you think you have taken too much of this medicine contact apoison control center or emergency room at once. NOTE: This medicine is only for you. Do not share this medicine with others. What if I miss a dose? Keep appointments for follow-up doses. It is important not to miss your dose.Call your health care provider if you are unable to keep an  appointment. What may interact with this medication? certain antibiotics given by injection NSAIDs, medicines for pain and inflammation, like ibuprofen or naproxen some diuretics like bumetanide, furosemide teriparatide thalidomide This list may not describe all possible interactions. Give your health care provider a list of all the medicines, herbs, non-prescription drugs, or dietary supplements you use. Also tell them if you smoke, drink alcohol, or use illegaldrugs. Some items may interact with your medicine. What should I watch for while using this medication? Visit your health care provider for regular checks on your progress. It may besome time before you see the benefit from this drug. Some people who take this drug have severe bone, joint, or muscle pain. This drug may also increase your risk for jaw problems or a broken thigh bone. Tell your health care provider right away if you have severe pain in your jaw, bones, joints, or muscles. Tell you health care provider if you have any painthat does not go away or that gets worse. Tell your dentist and dental surgeon that you are taking this drug. You should not have major dental surgery while on this drug. See your dentist to have a dental exam and fix any dental problems before starting this drug. Take good care of your teeth while on this drug. Make sure you see your dentist forregular follow-up appointments. You should make sure you get enough calcium and vitamin D while you are taking this drug. Discuss the foods you eat and the vitamins you take with your healthcare provider. Check  with your health care provider if you have severe diarrhea, nausea, and vomiting, or if you sweat a lot. The loss of too much body fluid may make itdangerous for you to take this drug. You may need blood work done while you are taking this drug. Do not become pregnant while taking this drug. Women should inform their health care provider if they wish to become  pregnant or think they might be pregnant. There is potential for serious harm to an unborn child. Talk to your healthcare provider for more information. What side effects may I notice from receiving this medication? Side effects that you should report to your doctor or health care provider assoon as possible: allergic reactions (skin rash, itching or hives; swelling of the face, lips, or tongue) bone pain infection (fever, chills, cough, sore throat, pain or trouble passing urine) jaw pain, especially after dental work joint pain kidney injury (trouble passing urine or change in the amount of urine) low blood pressure (dizziness; feeling faint or lightheaded, falls; unusually weak or tired) low calcium levels (fast heartbeat; muscle cramps or pain; pain, tingling, or numbness in the hands or feet; seizures) low magnesium levels (fast, irregular heartbeat; muscle cramp or pain; muscle weakness; tremors; seizures) low red blood cell counts (trouble breathing; feeling faint; lightheaded, falls; unusually weak or tired) muscle pain redness, blistering, peeling, or loosening of the skin, including inside the mouth severe diarrhea swelling of the ankles, feet, hands trouble breathing Side effects that usually do not require medical attention (report to yourdoctor or health care provider if they continue or are bothersome): anxious constipation coughing depressed mood eye irritation, itching, or pain fever general ill feeling or flu-like symptoms nausea pain, redness, or irritation at site where injected trouble sleeping This list may not describe all possible side effects. Call your doctor for medical advice about side effects. You may report side effects to FDA at1-800-FDA-1088. Where should I keep my medication? This drug is given in a hospital or clinic. It will not be stored at home. NOTE: This sheet is a summary. It may not cover all possible information. If you have questions about  this medicine, talk to your doctor, pharmacist, orhealth care provider.  2022 Elsevier/Gold Standard (2019-02-23 09:13:00)

## 2021-01-08 LAB — IGG, IGA, IGM
IgA: 21 mg/dL — ABNORMAL LOW (ref 61–437)
IgG (Immunoglobin G), Serum: 476 mg/dL — ABNORMAL LOW (ref 603–1613)
IgM (Immunoglobulin M), Srm: 29 mg/dL (ref 15–143)

## 2021-01-08 LAB — KAPPA/LAMBDA LIGHT CHAINS
Kappa free light chain: 9.7 mg/L (ref 3.3–19.4)
Kappa, lambda light chain ratio: 1.62 (ref 0.26–1.65)
Lambda free light chains: 6 mg/L (ref 5.7–26.3)

## 2021-01-09 LAB — PROTEIN ELECTROPHORESIS, SERUM, WITH REFLEX
A/G Ratio: 1.5 (ref 0.7–1.7)
Albumin ELP: 3.4 g/dL (ref 2.9–4.4)
Alpha-1-Globulin: 0.2 g/dL (ref 0.0–0.4)
Alpha-2-Globulin: 0.8 g/dL (ref 0.4–1.0)
Beta Globulin: 0.9 g/dL (ref 0.7–1.3)
Gamma Globulin: 0.5 g/dL (ref 0.4–1.8)
Globulin, Total: 2.3 g/dL (ref 2.2–3.9)
Total Protein ELP: 5.7 g/dL — ABNORMAL LOW (ref 6.0–8.5)

## 2021-01-15 ENCOUNTER — Other Ambulatory Visit: Payer: Self-pay | Admitting: Hematology & Oncology

## 2021-02-04 ENCOUNTER — Encounter: Payer: Self-pay | Admitting: Hematology & Oncology

## 2021-02-04 ENCOUNTER — Other Ambulatory Visit: Payer: Self-pay

## 2021-02-04 ENCOUNTER — Inpatient Hospital Stay: Payer: Medicare Other | Attending: Hematology & Oncology

## 2021-02-04 ENCOUNTER — Inpatient Hospital Stay (HOSPITAL_BASED_OUTPATIENT_CLINIC_OR_DEPARTMENT_OTHER): Payer: Medicare Other | Admitting: Hematology & Oncology

## 2021-02-04 ENCOUNTER — Inpatient Hospital Stay: Payer: Medicare Other

## 2021-02-04 VITALS — BP 163/81 | HR 55 | Resp 17

## 2021-02-04 VITALS — BP 130/67 | HR 58 | Temp 97.7°F | Resp 18 | Wt 216.0 lb

## 2021-02-04 DIAGNOSIS — Z79899 Other long term (current) drug therapy: Secondary | ICD-10-CM | POA: Insufficient documentation

## 2021-02-04 DIAGNOSIS — C9000 Multiple myeloma not having achieved remission: Secondary | ICD-10-CM

## 2021-02-04 DIAGNOSIS — Z5112 Encounter for antineoplastic immunotherapy: Secondary | ICD-10-CM | POA: Insufficient documentation

## 2021-02-04 DIAGNOSIS — D469 Myelodysplastic syndrome, unspecified: Secondary | ICD-10-CM | POA: Diagnosis not present

## 2021-02-04 DIAGNOSIS — D630 Anemia in neoplastic disease: Secondary | ICD-10-CM | POA: Diagnosis not present

## 2021-02-04 DIAGNOSIS — C9002 Multiple myeloma in relapse: Secondary | ICD-10-CM | POA: Insufficient documentation

## 2021-02-04 LAB — CMP (CANCER CENTER ONLY)
ALT: 10 U/L (ref 0–44)
AST: 15 U/L (ref 15–41)
Albumin: 4 g/dL (ref 3.5–5.0)
Alkaline Phosphatase: 49 U/L (ref 38–126)
Anion gap: 7 (ref 5–15)
BUN: 22 mg/dL (ref 8–23)
CO2: 29 mmol/L (ref 22–32)
Calcium: 10 mg/dL (ref 8.9–10.3)
Chloride: 104 mmol/L (ref 98–111)
Creatinine: 1.23 mg/dL (ref 0.61–1.24)
GFR, Estimated: 58 mL/min — ABNORMAL LOW (ref >60)
Glucose, Bld: 94 mg/dL (ref 70–99)
Potassium: 3.7 mmol/L (ref 3.5–5.1)
Sodium: 140 mmol/L (ref 135–145)
Total Bilirubin: 0.8 mg/dL (ref 0.3–1.2)
Total Protein: 5.7 g/dL — ABNORMAL LOW (ref 6.5–8.1)

## 2021-02-04 LAB — CBC WITH DIFFERENTIAL (CANCER CENTER ONLY)
Abs Immature Granulocytes: 0.02 10*3/uL (ref 0.00–0.07)
Basophils Absolute: 0 10*3/uL (ref 0.0–0.1)
Basophils Relative: 0 %
Eosinophils Absolute: 0.1 10*3/uL (ref 0.0–0.5)
Eosinophils Relative: 2 %
HCT: 32.7 % — ABNORMAL LOW (ref 39.0–52.0)
Hemoglobin: 10.9 g/dL — ABNORMAL LOW (ref 13.0–17.0)
Immature Granulocytes: 0 %
Lymphocytes Relative: 38 %
Lymphs Abs: 2 10*3/uL (ref 0.7–4.0)
MCH: 31.7 pg (ref 26.0–34.0)
MCHC: 33.3 g/dL (ref 30.0–36.0)
MCV: 95.1 fL (ref 80.0–100.0)
Monocytes Absolute: 0.6 10*3/uL (ref 0.1–1.0)
Monocytes Relative: 10 %
Neutro Abs: 2.6 10*3/uL (ref 1.7–7.7)
Neutrophils Relative %: 50 %
Platelet Count: 193 10*3/uL (ref 150–400)
RBC: 3.44 MIL/uL — ABNORMAL LOW (ref 4.22–5.81)
RDW: 13 % (ref 11.5–15.5)
WBC Count: 5.3 10*3/uL (ref 4.0–10.5)
nRBC: 0 % (ref 0.0–0.2)

## 2021-02-04 LAB — LACTATE DEHYDROGENASE: LDH: 189 U/L (ref 98–192)

## 2021-02-04 MED ORDER — HEPARIN SOD (PORK) LOCK FLUSH 100 UNIT/ML IV SOLN
500.0000 [IU] | Freq: Once | INTRAVENOUS | Status: AC
  Administered 2021-02-04: 500 [IU] via INTRAVENOUS

## 2021-02-04 MED ORDER — DIPHENHYDRAMINE HCL 25 MG PO CAPS
50.0000 mg | ORAL_CAPSULE | Freq: Once | ORAL | Status: AC
  Administered 2021-02-04: 50 mg via ORAL
  Filled 2021-02-04: qty 2

## 2021-02-04 MED ORDER — ACETAMINOPHEN 325 MG PO TABS
650.0000 mg | ORAL_TABLET | Freq: Once | ORAL | Status: AC
  Administered 2021-02-04: 650 mg via ORAL
  Filled 2021-02-04: qty 2

## 2021-02-04 MED ORDER — HEPARIN SOD (PORK) LOCK FLUSH 100 UNIT/ML IV SOLN
500.0000 [IU] | Freq: Once | INTRAVENOUS | Status: AC | PRN
  Administered 2021-02-04: 500 [IU]

## 2021-02-04 MED ORDER — DARATUMUMAB-HYALURONIDASE-FIHJ 1800-30000 MG-UT/15ML ~~LOC~~ SOLN
1800.0000 mg | Freq: Once | SUBCUTANEOUS | Status: AC
  Administered 2021-02-04: 1800 mg via SUBCUTANEOUS
  Filled 2021-02-04: qty 15

## 2021-02-04 MED ORDER — DEXAMETHASONE 4 MG PO TABS
20.0000 mg | ORAL_TABLET | Freq: Once | ORAL | Status: AC
  Administered 2021-02-04: 20 mg via ORAL
  Filled 2021-02-04: qty 5

## 2021-02-04 MED ORDER — SODIUM CHLORIDE 0.9% FLUSH
10.0000 mL | INTRAVENOUS | Status: DC | PRN
  Administered 2021-02-04: 10 mL

## 2021-02-04 MED ORDER — SODIUM CHLORIDE 0.9% FLUSH
10.0000 mL | Freq: Once | INTRAVENOUS | Status: AC
  Administered 2021-02-04: 10 mL via INTRAVENOUS

## 2021-02-04 NOTE — Progress Notes (Signed)
Hematology and Oncology Follow Up Visit  Robert [name] [id number] 1934-02-18 87 y.o. 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 3 weeks for hemoglobin less than 10   Interim History:  [name] is here today for follow-up and treatment.  He has lost a little bit of weight.  I am happy about this.  Hopefully he will exercise a little bit more.  He has had no problems with bowels or bladder.  He has had no nausea or vomiting.  His appetite has been good.  His wife has a great job and feeding him nutritious food.  He has had no fever.  He has had no cough or shortness of breath.  There is been no problems with leg swelling.  His blood pressure has been under very good control.  His last myeloma studies did not show a monoclonal spike in his blood.  His IgG level was 476 mg/dL.  The Kappa light chain was 1.0 mg/dL.  He has had no rashes.  There is been no bleeding.  Overall, his performance status is ECOG 1.  .    Medications:  Allergies as of 02/04/2021   No Known Allergies      Medication List        Accurate as of February 04, 2021  9:46 AM. If you have any questions, ask your nurse or doctor.          amoxicillin 500 MG capsule Commonly known as: AMOXIL TAKE 4 CAPSULES (2,000 MG TOTAL) BY MOUTH ONCE FOR 1 DOSE. TAKE 1 HOUR BEFORE DENTAL WORK   aspirin 81 MG tablet Take 81 mg by mouth daily.   bimatoprost 0.01 % Soln Commonly known as: LUMIGAN Place 1 drop into both eyes at bedtime.   CALTRATE 600+D PLUS PO Take 1 tablet by mouth daily.    ciclopirox 0.77 % cream Commonly known as: LOPROX   cloNIDine 0.1 MG tablet Commonly known as: CATAPRES Take 0.1 mg by mouth 2 (two) times daily as needed.   cyanocobalamin 2000 MCG tablet Take 2,000 mcg by mouth at bedtime.   famciclovir 250 MG tablet Commonly known as: FAMVIR TAKE 1 TABLET DAILY   furosemide 20 MG tablet Commonly known as: LASIX Take 2 tablets (40 mg total) by mouth daily.   losartan 100 MG tablet Commonly known as: COZAAR Take 100 mg by mouth daily.   metolazone 5 MG tablet Commonly known as: ZAROXOLYN TAKE 1 TABLET 1 HOUR PRIOR TO TAKING LASIX DAILY   montelukast 10 MG tablet Commonly known as: Singulair Take 1 tablet (10 mg total) by mouth at bedtime.   multivitamin with minerals Tabs tablet Take 1 tablet by mouth daily.   potassium chloride SA 20 MEQ tablet Commonly known as: KLOR-CON Take 20 mEq by mouth daily.   rosuvastatin 20 MG tablet Commonly known as: CRESTOR Take 20 mg by mouth at bedtime.   SYSTANE OP Place 1 drop into both eyes daily as needed (dry eyes).   Vitamin D3 25  MCG (1000 UT) Caps Take 1,000 Units by mouth daily.        Allergies: No Known Allergies  Past Medical History, Surgical history, Social history, and Family History were reviewed and updated.  Review of Systems: Review of Systems  Constitutional: Negative.   HENT: Negative.    Eyes: Negative.   Respiratory: Negative.    Cardiovascular: Negative.   Gastrointestinal: Negative.   Genitourinary: Negative.   Musculoskeletal: Negative.   Skin: Negative.   Neurological: Negative.   Endo/Heme/Allergies: Negative.   Psychiatric/Behavioral: Negative.       Physical Exam:  weight is 216 lb (98 kg). His oral temperature is 97.7 F (36.5 C). His blood pressure is 130/67 and his pulse is 58 (abnormal). His respiration is 18 and oxygen saturation is 100%.   Wt Readings from Last 3 Encounters:  02/04/21 216 lb (98 kg)  02/04/21 216 lb (98 kg)  01/07/21  220 lb (99.8 kg)    Physical Exam Vitals reviewed.  HENT:     Head: Normocephalic and atraumatic.  Eyes:     Pupils: Pupils are equal, round, and reactive to light.  Cardiovascular:     Rate and Rhythm: Normal rate and regular rhythm.     Heart sounds: Normal heart sounds.  Pulmonary:     Effort: Pulmonary effort is normal.     Breath sounds: Normal breath sounds.  Abdominal:     General: Bowel sounds are normal.     Palpations: Abdomen is soft.  Musculoskeletal:        General: No tenderness or deformity. Normal range of motion.     Cervical back: Normal range of motion.  Lymphadenopathy:     Cervical: No cervical adenopathy.  Skin:    General: Skin is warm and dry.     Findings: No erythema or rash.  Neurological:     Mental Status: He is alert and oriented to person, place, and time.  Psychiatric:        Behavior: Behavior normal.        Thought Content: Thought content normal.        Judgment: Judgment normal.     Lab Results  Component Value Date   WBC 5.3 02/04/2021   HGB 10.9 (L) 02/04/2021   HCT 32.7 (L) 02/04/2021   MCV 95.1 02/04/2021   PLT 193 02/04/2021   Lab Results  Component Value Date   FERRITIN 1,061 (H) 06/28/2019   IRON 82 06/28/2019   TIBC 281 06/28/2019   UIBC 200 06/28/2019   IRONPCTSAT 29 06/28/2019   Lab Results  Component Value Date   RETICCTPCT 2.2 04/05/2019   RBC 3.44 (L) 02/04/2021   Lab Results  Component Value Date   KPAFRELGTCHN 9.7 01/07/2021   LAMBDASER 6.0 01/07/2021   KAPLAMBRATIO 1.62 01/07/2021   Lab Results  Component Value Date   IGGSERUM 476 (L) 01/07/2021   IGA 21 (L) 01/07/2021   IGMSERUM 29 01/07/2021   Lab Results  Component Value Date   TOTALPROTELP 5.7 (L) 01/07/2021   ALBUMINELP 3.4 01/07/2021   A1GS 0.2 01/07/2021   A2GS 0.8 01/07/2021   BETS 0.9 01/07/2021   BETA2SER 0.3 01/03/2015   GAMS 0.5 01/07/2021   MSPIKE Not Observed 01/07/2021   SPEI Comment 06/26/2020     Chemistry       Component Value Date/Time   NA 140 02/04/2021 0901   NA 137 04/08/2017 1141   NA 138 10/29/2015 1029   K 3.7 02/04/2021 0901   K  3.4 04/08/2017 1141   K 3.9 10/29/2015 1029   CL 104 02/04/2021 0901   CL 102 04/08/2017 1141   CO2 29 02/04/2021 0901   CO2 30 04/08/2017 1141   CO2 27 10/29/2015 1029   BUN 22 02/04/2021 0901   BUN 16 04/08/2017 1141   BUN 13.8 10/29/2015 1029   CREATININE 1.23 02/04/2021 0901   CREATININE 1.3 (H) 04/08/2017 1141   CREATININE 1.1 10/29/2015 1029      Component Value Date/Time   CALCIUM 10.0 02/04/2021 0901   CALCIUM 9.3 04/08/2017 1141   CALCIUM 9.3 10/29/2015 1029   ALKPHOS 49 02/04/2021 0901   ALKPHOS 66 04/08/2017 1141   ALKPHOS 52 10/29/2015 1029   AST 15 02/04/2021 0901   AST 20 10/29/2015 1029   ALT 10 02/04/2021 0901   ALT 21 04/08/2017 1141   ALT 14 10/29/2015 1029   BILITOT 0.8 02/04/2021 0901   BILITOT 0.75 10/29/2015 1029       Impression and Plan: Mr. Bradway is a very pleasant 87 yo African American gentleman with relapsed IgG kappa myeloma. He has history of stem cell transplant in 2006.   It is hard to believe that he is 87 years old.  I am just very impressed with how well he looks.  He is losing a little bit of weight.  His blood pressure is under much better control.  He does not have swelling in his legs any longer.  We will go ahead with the Faspro.  He will get his Zometa today.  I will plan to see him back in another month.Volanda Napoleon, MD 9/13/20229:46 AM

## 2021-02-05 LAB — IGG, IGA, IGM
IgA: 20 mg/dL — ABNORMAL LOW (ref 61–437)
IgG (Immunoglobin G), Serum: 483 mg/dL — ABNORMAL LOW (ref 603–1613)
IgM (Immunoglobulin M), Srm: 33 mg/dL (ref 15–143)

## 2021-02-05 LAB — KAPPA/LAMBDA LIGHT CHAINS
Kappa free light chain: 15.4 mg/L (ref 3.3–19.4)
Kappa, lambda light chain ratio: 1.81 — ABNORMAL HIGH (ref 0.26–1.65)
Lambda free light chains: 8.5 mg/L (ref 5.7–26.3)

## 2021-02-11 LAB — PROTEIN ELECTROPHORESIS, SERUM, WITH REFLEX
A/G Ratio: 1.7 (ref 0.7–1.7)
Albumin ELP: 3.4 g/dL (ref 2.9–4.4)
Alpha-1-Globulin: 0.2 g/dL (ref 0.0–0.4)
Alpha-2-Globulin: 0.8 g/dL (ref 0.4–1.0)
Beta Globulin: 0.7 g/dL (ref 0.7–1.3)
Gamma Globulin: 0.3 g/dL — ABNORMAL LOW (ref 0.4–1.8)
Globulin, Total: 2 g/dL — ABNORMAL LOW (ref 2.2–3.9)
Total Protein ELP: 5.4 g/dL — ABNORMAL LOW (ref 6.0–8.5)

## 2021-02-25 ENCOUNTER — Ambulatory Visit (INDEPENDENT_AMBULATORY_CARE_PROVIDER_SITE_OTHER): Payer: Medicare Other | Admitting: Cardiology

## 2021-02-25 ENCOUNTER — Encounter: Payer: Self-pay | Admitting: Cardiology

## 2021-02-25 ENCOUNTER — Other Ambulatory Visit: Payer: Self-pay

## 2021-02-25 VITALS — BP 168/78 | HR 68 | Ht 71.0 in | Wt 217.0 lb

## 2021-02-25 DIAGNOSIS — E782 Mixed hyperlipidemia: Secondary | ICD-10-CM

## 2021-02-25 DIAGNOSIS — C9000 Multiple myeloma not having achieved remission: Secondary | ICD-10-CM | POA: Diagnosis not present

## 2021-02-25 DIAGNOSIS — I10 Essential (primary) hypertension: Secondary | ICD-10-CM | POA: Diagnosis not present

## 2021-02-25 DIAGNOSIS — Z9481 Bone marrow transplant status: Secondary | ICD-10-CM | POA: Diagnosis not present

## 2021-02-25 NOTE — Patient Instructions (Signed)

## 2021-02-25 NOTE — Progress Notes (Signed)
Cardiology Office Note:    Date:  02/25/2021   ID:  [name], DOB 1934/01/20, MRN [id number]  PCP:  Leanna Battles, MD  Cardiologist:  Jenne Campus, MD    Referring MD: Leanna Battles, MD   Chief Complaint  Patient presents with   Follow-up  I am doing fine  History of Present Illness:    Robert [name] is a 87 y.o. male with past medical history significant for multiple myeloma initially diagnosed in 2006, he did have bone marrow transplant doing very well.  Also essential hypertension and also whitecoat hypertension, chronic kidney failure he is coming today to my office for follow-up.  Overall he is doing very well.  Denies have any chest pain tightness squeezing pressure burning chest still fairly active and overall doing well., dyslipidemia.  Of course concern is about his blood pressure which is elevated today but he said he check it at home just before he left to see [place] was 355 systolic.  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   Lytic bone lesions on xray 03/17/2016   Multiple myeloma ([place])    2006   Multiple myeloma in remission ([place]) 03/19/2010   Myeloma ([place]) 07/02/2011   S/P autologous bone marrow transplantation ([place]) 04/29/2005   Swelling of both lower extremities 10/18/2018   Syncope 12/24/2017   White coat syndrome with diagnosis of hypertension 11/28/2015    Past Surgical History:  Procedure Laterality Date    CATARACT EXTRACTION BILATERAL W/ ANTERIOR VITRECTOMY     IR IMAGING GUIDED PORT INSERTION  05/30/2018   LIMBAL STEM CELL TRANSPLANT      Current Medications: Current Meds  Medication Sig   aspirin 81 MG tablet Take 81 mg by mouth daily.   bimatoprost (LUMIGAN) 0.01 % SOLN Place 1 drop into both eyes at bedtime.    Calcium Carbonate-Vit D-Min (CALTRATE 600+D PLUS PO) Take 1 tablet by mouth daily.   Cholecalciferol (VITAMIN D3) 1000 UNITS CAPS Take 1,000 Units by mouth daily.    ciclopirox (LOPROX) 0.77 % cream Apply 1 application topically 2 (two) times daily.   cloNIDine (CATAPRES) 0.1 MG tablet Take 0.1 mg by mouth 2 (two) times daily as needed (Elevated BP).   cyanocobalamin 2000 MCG tablet Take 2,000 mcg by mouth at bedtime.    famciclovir (FAMVIR) 250 MG tablet TAKE 1 TABLET DAILY (Patient taking differently: Take 250 mg by mouth daily.)   furosemide (LASIX) 20 MG tablet Take 2 tablets (40 mg total) by mouth daily.   losartan (COZAAR) 100 MG tablet Take 100 mg by mouth daily.   metolazone (ZAROXOLYN) 5 MG tablet TAKE 1 TABLET 1 HOUR PRIOR TO TAKING LASIX DAILY (Patient taking differently: Take 5 mg by mouth See admin instructions. TAKE 1 TABLET 1 HOUR PRIOR TO TAKING LASIX DAILY)   montelukast (SINGULAIR) 10 MG tablet Take 1 tablet (10 mg total) by mouth at bedtime.   Multiple Vitamin (MULITIVITAMIN WITH MINERALS)  TABS Take 1 tablet by mouth daily. Unknown strenght   Polyethyl Glycol-Propyl Glycol (SYSTANE OP) Place 1 drop into both eyes daily as needed (dry eyes).    potassium chloride SA (K-DUR,KLOR-CON) 20 MEQ tablet Take 20 mEq by mouth daily.   rosuvastatin (CRESTOR) 20 MG tablet Take 20 mg by mouth at bedtime.     Allergies:   Patient has no known allergies.   Social History   Socioeconomic History   Marital status: Married    Spouse name: Not on file   Number of children: Not on file   Years of education: Not on file   Highest education level: Not on file  Occupational  History   Not on file  Tobacco Use   Smoking status: Former    Packs/day: 4.00    Years: 9.00    Pack years: 36.00    Types: Cigars, Cigarettes    Start date: 07/08/1979    Quit date: 07/07/1988    Years since quitting: 32.6   Smokeless tobacco: Never   Tobacco comments:    quit 24 years ago  Vaping Use   Vaping Use: Never used  Substance and Sexual Activity   Alcohol use: Yes    Alcohol/week: 6.0 standard drinks    Types: 6 Glasses of wine per week    Comment: very seldom   Drug use: No   Sexual activity: Not on file  Other Topics Concern   Not on file  Social History Narrative   Not on file   Social Determinants of Health   Financial Resource Strain: Not on file  Food Insecurity: Not on file  Transportation Needs: Not on file  Physical Activity: Not on file  Stress: Not on file  Social Connections: Not on file     Family History: The patient's family history includes Diabetes in his brother; Heart attack in his father; Heart disease in his father; Throat cancer in his mother. There is no history of Colon cancer. ROS:   Please see the history of present illness.    All 14 point review of systems negative except as described per history of present illness  EKGs/Labs/Other Studies Reviewed:      Recent Labs: 02/04/2021: ALT 10; BUN 22; Creatinine 1.23; Hemoglobin 10.9; Platelet Count 193; Potassium 3.7; Sodium 140  Recent Lipid Panel No results found for: CHOL, TRIG, HDL, CHOLHDL, VLDL, LDLCALC, LDLDIRECT  Physical Exam:    VS:  BP (!) 168/78 (BP Location: Left Arm, Patient Position: Sitting)   Pulse 68   Ht _0  (1.803 m)   Wt 217 lb (98.4 kg)   SpO2 (!) 68%   BMI 30.27 kg/m     Wt Readings from Last 3 Encounters:  02/25/21 217 lb (98.4 kg)  02/04/21 216 lb (98 kg)  02/04/21 216 lb (98 kg)     GEN:  Well nourished, well developed in no acute distress HEENT: Normal NECK: No JVD; No carotid bruits LYMPHATICS: No lymphadenopathy CARDIAC: RRR, no  murmurs, no rubs, no gallops RESPIRATORY:  Clear to auscultation without rales, wheezing or rhonchi  ABDOMEN: Soft, non-tender, non-distended MUSCULOSKELETAL:  No edema; No deformity  SKIN: Warm and dry LOWER EXTREMITIES: no swelling NEUROLOGIC:  Alert and oriented x 3 PSYCHIATRIC:  Normal affect   ASSESSMENT:    1. White coat syndrome with diagnosis of hypertension   2. IgG multiple myeloma (HCC)   3. S/P autologous bone marrow transplantation ([place])   4. Mixed hyperlipidemia    PLAN:  In order of problems listed above:  [place] hypertension.  I told him to bring blood pressure monitor to [place] so we can verify how accurate the device is.  I also think he can benefit from 24 hours blood pressure monitor however we do not have blood pressure monitor for 24 hours available in this office.  If he is home blood pressure monitor is accurate then we will continue manage him as whitecoat hypertension if not then it would put 24 hours blood pressure monitor on him. Multiple myeloma excellently managed by our oncology team Mixed dyslipidemia he admits today he does not decreased on the regular basis I will recheck his fasting lipid profile but obviously if he does not decreased and that does not make sense I asked him to start taking Crestor on the regular basis and then we will check his fasting lipid profile   Medication Adjustments/Labs and Tests Ordered: Current medicines are reviewed at length with the patient today.  Concerns regarding medicines are outlined above.  No orders of the defined types were placed in this encounter.  Medication changes: No orders of the defined types were placed in this encounter.   Signed, Park Liter, MD, [facility] 02/25/2021 4:17 PM    [facility]

## 2021-03-04 ENCOUNTER — Inpatient Hospital Stay: Payer: Medicare Other

## 2021-03-04 ENCOUNTER — Other Ambulatory Visit: Payer: Self-pay

## 2021-03-04 ENCOUNTER — Encounter: Payer: Self-pay | Admitting: Hematology & Oncology

## 2021-03-04 ENCOUNTER — Inpatient Hospital Stay: Payer: Medicare Other | Attending: Hematology & Oncology

## 2021-03-04 ENCOUNTER — Inpatient Hospital Stay (HOSPITAL_BASED_OUTPATIENT_CLINIC_OR_DEPARTMENT_OTHER): Payer: Medicare Other | Admitting: Hematology & Oncology

## 2021-03-04 VITALS — BP 160/79 | HR 59 | Temp 97.8°F | Resp 18 | Wt 216.5 lb

## 2021-03-04 VITALS — BP 164/73 | HR 50

## 2021-03-04 DIAGNOSIS — Z79899 Other long term (current) drug therapy: Secondary | ICD-10-CM | POA: Diagnosis not present

## 2021-03-04 DIAGNOSIS — C9000 Multiple myeloma not having achieved remission: Secondary | ICD-10-CM

## 2021-03-04 DIAGNOSIS — C9002 Multiple myeloma in relapse: Secondary | ICD-10-CM | POA: Insufficient documentation

## 2021-03-04 DIAGNOSIS — D630 Anemia in neoplastic disease: Secondary | ICD-10-CM | POA: Diagnosis not present

## 2021-03-04 DIAGNOSIS — Z5112 Encounter for antineoplastic immunotherapy: Secondary | ICD-10-CM | POA: Insufficient documentation

## 2021-03-04 LAB — CMP (CANCER CENTER ONLY)
ALT: 12 U/L (ref 0–44)
AST: 15 U/L (ref 15–41)
Albumin: 4.1 g/dL (ref 3.5–5.0)
Alkaline Phosphatase: 46 U/L (ref 38–126)
Anion gap: 7 (ref 5–15)
BUN: 19 mg/dL (ref 8–23)
CO2: 29 mmol/L (ref 22–32)
Calcium: 10.1 mg/dL (ref 8.9–10.3)
Chloride: 103 mmol/L (ref 98–111)
Creatinine: 1.13 mg/dL (ref 0.61–1.24)
GFR, Estimated: >60 mL/min (ref >60)
Glucose, Bld: 93 mg/dL (ref 70–99)
Potassium: 3.8 mmol/L (ref 3.5–5.1)
Sodium: 139 mmol/L (ref 135–145)
Total Bilirubin: 0.8 mg/dL (ref 0.3–1.2)
Total Protein: 5.8 g/dL — ABNORMAL LOW (ref 6.5–8.1)

## 2021-03-04 LAB — CBC WITH DIFFERENTIAL (CANCER CENTER ONLY)
Abs Immature Granulocytes: 0.02 10*3/uL (ref 0.00–0.07)
Basophils Absolute: 0 10*3/uL (ref 0.0–0.1)
Basophils Relative: 1 %
Eosinophils Absolute: 0.1 10*3/uL (ref 0.0–0.5)
Eosinophils Relative: 3 %
HCT: 34 % — ABNORMAL LOW (ref 39.0–52.0)
Hemoglobin: 11.4 g/dL — ABNORMAL LOW (ref 13.0–17.0)
Immature Granulocytes: 0 %
Lymphocytes Relative: 36 %
Lymphs Abs: 1.9 10*3/uL (ref 0.7–4.0)
MCH: 31.9 pg (ref 26.0–34.0)
MCHC: 33.5 g/dL (ref 30.0–36.0)
MCV: 95.2 fL (ref 80.0–100.0)
Monocytes Absolute: 0.6 10*3/uL (ref 0.1–1.0)
Monocytes Relative: 10 %
Neutro Abs: 2.8 10*3/uL (ref 1.7–7.7)
Neutrophils Relative %: 50 %
Platelet Count: 205 10*3/uL (ref 150–400)
RBC: 3.57 MIL/uL — ABNORMAL LOW (ref 4.22–5.81)
RDW: 13.2 % (ref 11.5–15.5)
WBC Count: 5.4 10*3/uL (ref 4.0–10.5)
nRBC: 0 % (ref 0.0–0.2)

## 2021-03-04 LAB — LACTATE DEHYDROGENASE: LDH: 189 U/L (ref 98–192)

## 2021-03-04 MED ORDER — DIPHENHYDRAMINE HCL 25 MG PO CAPS
50.0000 mg | ORAL_CAPSULE | Freq: Once | ORAL | Status: AC
  Administered 2021-03-04: 50 mg via ORAL
  Filled 2021-03-04: qty 2

## 2021-03-04 MED ORDER — SODIUM CHLORIDE 0.9% FLUSH
10.0000 mL | INTRAVENOUS | Status: DC | PRN
  Administered 2021-03-04: 10 mL

## 2021-03-04 MED ORDER — HEPARIN SOD (PORK) LOCK FLUSH 100 UNIT/ML IV SOLN
500.0000 [IU] | Freq: Once | INTRAVENOUS | Status: AC | PRN
  Administered 2021-03-04: 500 [IU]

## 2021-03-04 MED ORDER — DEXAMETHASONE 4 MG PO TABS
20.0000 mg | ORAL_TABLET | Freq: Once | ORAL | Status: AC
  Administered 2021-03-04: 20 mg via ORAL
  Filled 2021-03-04: qty 5

## 2021-03-04 MED ORDER — SODIUM CHLORIDE 0.9 % IV SOLN: Freq: Once | INTRAVENOUS | Status: DC

## 2021-03-04 MED ORDER — DARATUMUMAB-HYALURONIDASE-FIHJ 1800-30000 MG-UT/15ML ~~LOC~~ SOLN
1800.0000 mg | Freq: Once | SUBCUTANEOUS | Status: AC
  Administered 2021-03-04: 1800 mg via SUBCUTANEOUS
  Filled 2021-03-04: qty 15

## 2021-03-04 MED ORDER — ACETAMINOPHEN 325 MG PO TABS
650.0000 mg | ORAL_TABLET | Freq: Once | ORAL | Status: AC
  Administered 2021-03-04: 650 mg via ORAL
  Filled 2021-03-04: qty 2
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
## 2021-03-04 NOTE — Patient Instructions (Signed)
Tunneled Central Venous Catheter Flushing Guide It is important to flush your tunneled central venous catheter each time you use it, both before and after you use it. Flushing your catheter will help prevent it from clogging. What are the risks? Risks may include: Infection. Air getting into the catheter and bloodstream. Supplies needed: A clean pair of gloves. A disinfecting wipe. Use an alcohol wipe, chlorhexidine wipe, or iodine wipe as told by your health care provider. A 10 mL syringe that has been prefilled with saline solution. An empty 10 mL syringe, if a substance called heparin was injected into your catheter. How to flush your catheter When you flush your catheter, make sure you follow any specific instructions from your health care provider or the manufacturer. These are general guidelines. Flushing your catheter before use If there is heparin in your catheter: Wash your hands with soap and water. Put on gloves. Scrub the injection cap for a minimum of 15 seconds with a disinfecting wipe. Unclamp the catheter. Attach the empty syringe to the injection cap. Pull the syringe plunger back and withdraw 10 mL of blood. Place the syringe into an appropriate waste container. Scrub the injection cap for 15 seconds with a disinfecting wipe. Attach the prefilled syringe to the injection cap. Flush the catheter by pushing the plunger forward until all the liquid from the syringe is in the catheter. Remove the syringe from the injection cap. Clamp the catheter. If there is no heparin in your catheter: Wash your hands with soap and water. Put on gloves. Scrub the injection cap for 15 seconds with a disinfecting wipe. Unclamp the catheter. Attach the prefilled syringe to the injection cap. Flush the catheter by pushing the plunger forward until 5 mL of the liquid from the syringe is in the catheter. Pull back on the syringe until you see blood in the catheter. If you have been asked  to collect any blood, follow your health care provider's instructions. Otherwise, flush the catheter with the rest of the solution from the syringe. Remove the syringe from the injection cap. Clamp the catheter.  Flushing your catheter after use Wash your hands with soap and water. Put on gloves. Scrub the injection cap for 15 seconds with a disinfecting wipe. Unclamp the catheter. Attach the prefilled syringe to the injection cap. Flush the catheter by pushing the plunger forward until all of the liquid from the syringe is in the catheter. Remove the syringe from the injection cap. Clamp the catheter. Problems and solutions If blood cannot be completely cleared from the injection cap, you may need to have the injection cap replaced. If the catheter is difficult to flush, use the pulsing method. The pulsing method involves pushing only a few milliliters of solution into the catheter at a time and pausing between pushes. If you do not see blood in the catheter when you pull back on the syringe, change your body position, such as by raising your arms above your head. Take a deep breath and cough. Then, pull back on the syringe. If you still do not see blood, flush the catheter with a small amount of solution. Then, change positions again and take a breath or cough. Pull back on the syringe again. If you still do not see blood, finish flushing the catheter and contact your health care provider. Do not use your catheter until your health care provider says it is okay. General tips Have someone help you flush your catheter, if possible. Do not force fluid   through your catheter. Do not use a syringe that is larger or smaller than 10 mL. Using a smaller syringe can make the catheter burst. Do not use your catheter without flushing it first if it has heparin in it. Contact a health care provider if: You cannot see any blood in the catheter when you flush it before using it. Your catheter is difficult  to flush. Get help right away if: You cannot flush the catheter. The catheter leaks when you flush it or when there is fluid in it. There are cracks or breaks in the catheter. Summary It is important to flush your tunneled central venous catheter each time you use it, both before and after you use it. Scrub the injection cap for 15 seconds with a disinfecting wipe before and after you flush it. When you flush your catheter, make sure you follow any specific instructions from your health care provider or the manufacturer. Get help right away if you cannot flush the catheter. This information is not intended to replace advice given to you by your health care provider. Make sure you discuss any questions you have with your health care provider. Document Revised: 07/20/2019 Document Reviewed: 07/27/2018 Elsevier Patient Education  2022 [employer]  

## 2021-03-04 NOTE — Patient Instructions (Signed)
[facility]  Discharge Instructions: Thank you for choosing [facility] to provide your oncology and hematology care.   If you have a lab appointment with the [facility], please go directly to the [facility] and check in at the registration area.  Wear comfortable clothing and clothing appropriate for easy access to any Portacath or PICC line.   We strive to give you quality time with your provider. You may need to reschedule your appointment if you arrive late (15 or more minutes).  Arriving late affects you and other patients whose appointments are after yours.  Also, if you miss three or more appointments without notifying the office, you may be dismissed from the clinic at the provider's discretion.      For prescription refill requests, have your pharmacy contact our office and allow 72 hours for refills to be completed.    Today you received the following chemotherapy and/or immunotherapy agents Darzalex       To help prevent nausea and vomiting after your treatment, we encourage you to take your nausea medication as directed.  BELOW ARE SYMPTOMS THAT SHOULD BE REPORTED IMMEDIATELY: *FEVER GREATER THAN 100.4 F (38 C) OR HIGHER *CHILLS OR SWEATING *NAUSEA AND VOMITING THAT IS NOT CONTROLLED WITH YOUR NAUSEA MEDICATION *UNUSUAL SHORTNESS OF BREATH *UNUSUAL BRUISING OR BLEEDING *URINARY PROBLEMS (pain or burning when urinating, or frequent urination) *BOWEL PROBLEMS (unusual diarrhea, constipation, pain near the anus) TENDERNESS IN MOUTH AND THROAT WITH OR WITHOUT PRESENCE OF ULCERS (sore throat, sores in mouth, or a toothache) UNUSUAL RASH, SWELLING OR PAIN  UNUSUAL VAGINAL DISCHARGE OR ITCHING   Items with * indicate a potential emergency and should be followed up as soon as possible or go to the Emergency Department if any problems should occur.  Please show the CHEMOTHERAPY ALERT CARD or IMMUNOTHERAPY ALERT CARD at check-in to the  Emergency Department and triage nurse. Should you have questions after your visit or need to cancel or reschedule your appointment, please contact [facility]  [phone] and follow the prompts.  Office hours are 8:00 a.m. to 4:30 p.m. Monday - Friday. Please note that voicemails left after 4:00 p.m. may not be returned until the following business day.  We are closed weekends and major holidays. You have access to a nurse at all times for urgent questions. Please call the main number to the clinic [phone] and follow the prompts.  For any non-urgent questions, you may also contact your provider using MyChart. We now offer e-Visits for anyone 67 and older to request care online for non-urgent symptoms. For details visit [name].   Also download the MyChart app! Go to the app store, search "MyChart", open the app, select , and log in with your MyChart username and password.  Due to Covid, a mask is required upon entering the hospital/clinic. If you do not have a mask, one will be given to you upon arrival. For doctor visits, patients may have 1 support person aged 66 or older with them. For treatment visits, patients cannot have anyone with them due to current Covid guidelines and our immunocompromised population.

## 2021-03-05 LAB — IGG, IGA, IGM
IgA: 20 mg/dL — ABNORMAL LOW (ref 61–437)
IgG (Immunoglobin G), Serum: 504 mg/dL — ABNORMAL LOW (ref 603–1613)
IgM (Immunoglobulin M), Srm: 30 mg/dL (ref 15–143)

## 2021-03-05 LAB — KAPPA/LAMBDA LIGHT CHAINS
Kappa free light chain: 12.1 mg/L (ref 3.3–19.4)
Kappa, lambda light chain ratio: 2.75 — ABNORMAL HIGH (ref 0.26–1.65)
Lambda free light chains: 4.4 mg/L — ABNORMAL LOW (ref 5.7–26.3)

## 2021-03-10 LAB — PROTEIN ELECTROPHORESIS, SERUM, WITH REFLEX
A/G Ratio: 1.5 (ref 0.7–1.7)
Albumin ELP: 3.5 g/dL (ref 2.9–4.4)
Alpha-1-Globulin: 0.2 g/dL (ref 0.0–0.4)
Alpha-2-Globulin: 0.9 g/dL (ref 0.4–1.0)
Beta Globulin: 0.8 g/dL (ref 0.7–1.3)
Gamma Globulin: 0.5 g/dL (ref 0.4–1.8)
Globulin, Total: 2.4 g/dL (ref 2.2–3.9)
M-Spike, %: 0.1 g/dL — ABNORMAL HIGH
SPEP Interpretation: 0
Total Protein ELP: 5.9 g/dL — ABNORMAL LOW (ref 6.0–8.5)

## 2021-03-10 LAB — IMMUNOFIXATION REFLEX, SERUM
IgA: 21 mg/dL — ABNORMAL LOW (ref 61–437)
IgG (Immunoglobin G), Serum: 498 mg/dL — ABNORMAL LOW (ref 603–1613)
IgM (Immunoglobulin M), Srm: 35 mg/dL (ref 15–143)

## 2021-03-24 ENCOUNTER — Other Ambulatory Visit: Payer: Self-pay | Admitting: *Deleted

## 2021-03-24 DIAGNOSIS — D630 Anemia in neoplastic disease: Secondary | ICD-10-CM

## 2021-03-24 MED ORDER — METOLAZONE 5 MG PO TABS: ORAL_TABLET | ORAL | 3 refills | Status: DC

## 2021-04-02 ENCOUNTER — Inpatient Hospital Stay: Payer: Medicare Other

## 2021-04-02 ENCOUNTER — Inpatient Hospital Stay (HOSPITAL_BASED_OUTPATIENT_CLINIC_OR_DEPARTMENT_OTHER): Payer: Medicare Other | Admitting: Hematology & Oncology

## 2021-04-02 ENCOUNTER — Other Ambulatory Visit: Payer: Self-pay

## 2021-04-02 ENCOUNTER — Inpatient Hospital Stay: Payer: Medicare Other | Attending: Hematology & Oncology

## 2021-04-02 VITALS — BP 161/80 | HR 60 | Temp 97.9°F | Resp 18 | Wt 216.0 lb

## 2021-04-02 DIAGNOSIS — C9000 Multiple myeloma not having achieved remission: Secondary | ICD-10-CM

## 2021-04-02 DIAGNOSIS — D469 Myelodysplastic syndrome, unspecified: Secondary | ICD-10-CM | POA: Insufficient documentation

## 2021-04-02 DIAGNOSIS — D630 Anemia in neoplastic disease: Secondary | ICD-10-CM | POA: Insufficient documentation

## 2021-04-02 DIAGNOSIS — C9002 Multiple myeloma in relapse: Secondary | ICD-10-CM | POA: Diagnosis present

## 2021-04-02 DIAGNOSIS — Z79899 Other long term (current) drug therapy: Secondary | ICD-10-CM | POA: Insufficient documentation

## 2021-04-02 DIAGNOSIS — Z5112 Encounter for antineoplastic immunotherapy: Secondary | ICD-10-CM | POA: Diagnosis not present

## 2021-04-02 LAB — CBC WITH DIFFERENTIAL (CANCER CENTER ONLY)
Abs Immature Granulocytes: 0.01 10*3/uL (ref 0.00–0.07)
Basophils Absolute: 0 10*3/uL (ref 0.0–0.1)
Basophils Relative: 1 %
Eosinophils Absolute: 0.1 10*3/uL (ref 0.0–0.5)
Eosinophils Relative: 2 %
HCT: 33.2 % — ABNORMAL LOW (ref 39.0–52.0)
Hemoglobin: 11.2 g/dL — ABNORMAL LOW (ref 13.0–17.0)
Immature Granulocytes: 0 %
Lymphocytes Relative: 38 %
Lymphs Abs: 2.3 10*3/uL (ref 0.7–4.0)
MCH: 32.2 pg (ref 26.0–34.0)
MCHC: 33.7 g/dL (ref 30.0–36.0)
MCV: 95.4 fL (ref 80.0–100.0)
Monocytes Absolute: 0.5 10*3/uL (ref 0.1–1.0)
Monocytes Relative: 8 %
Neutro Abs: 3 10*3/uL (ref 1.7–7.7)
Neutrophils Relative %: 51 %
Platelet Count: 208 10*3/uL (ref 150–400)
RBC: 3.48 MIL/uL — ABNORMAL LOW (ref 4.22–5.81)
RDW: 13.2 % (ref 11.5–15.5)
WBC Count: 5.9 10*3/uL (ref 4.0–10.5)
nRBC: 0 % (ref 0.0–0.2)

## 2021-04-02 LAB — CMP (CANCER CENTER ONLY)
ALT: 11 U/L (ref 0–44)
AST: 15 U/L (ref 15–41)
Albumin: 4 g/dL (ref 3.5–5.0)
Alkaline Phosphatase: 44 U/L (ref 38–126)
Anion gap: 9 (ref 5–15)
BUN: 20 mg/dL (ref 8–23)
CO2: 27 mmol/L (ref 22–32)
Calcium: 9.8 mg/dL (ref 8.9–10.3)
Chloride: 102 mmol/L (ref 98–111)
Creatinine: 1.21 mg/dL (ref 0.61–1.24)
GFR, Estimated: 59 mL/min — ABNORMAL LOW (ref >60)
Glucose, Bld: 113 mg/dL — ABNORMAL HIGH (ref 70–99)
Potassium: 3.8 mmol/L (ref 3.5–5.1)
Sodium: 138 mmol/L (ref 135–145)
Total Bilirubin: 0.7 mg/dL (ref 0.3–1.2)
Total Protein: 5.8 g/dL — ABNORMAL LOW (ref 6.5–8.1)

## 2021-04-02 LAB — LACTATE DEHYDROGENASE: LDH: 178 U/L (ref 98–192)

## 2021-04-02 MED ORDER — DIPHENHYDRAMINE HCL 25 MG PO CAPS
50.0000 mg | ORAL_CAPSULE | Freq: Once | ORAL | Status: AC
  Administered 2021-04-02: 50 mg via ORAL
  Filled 2021-04-02: qty 2

## 2021-04-02 MED ORDER — HEPARIN SOD (PORK) LOCK FLUSH 100 UNIT/ML IV SOLN
500.0000 [IU] | Freq: Once | INTRAVENOUS | Status: AC | PRN
  Administered 2021-04-02: 500 [IU]

## 2021-04-02 MED ORDER — DEXAMETHASONE 4 MG PO TABS
20.0000 mg | ORAL_TABLET | Freq: Once | ORAL | Status: AC
  Administered 2021-04-02: 20 mg via ORAL
  Filled 2021-04-02: qty 5

## 2021-04-02 MED ORDER — ACETAMINOPHEN 325 MG PO TABS
650.0000 mg | ORAL_TABLET | Freq: Once | ORAL | Status: AC
  Administered 2021-04-02: 650 mg via ORAL
  Filled 2021-04-02: qty 2

## 2021-04-02 MED ORDER — DARATUMUMAB-HYALURONIDASE-FIHJ 1800-30000 MG-UT/15ML ~~LOC~~ SOLN
1800.0000 mg | Freq: Once | SUBCUTANEOUS | Status: AC
  Administered 2021-04-02: 1800 mg via SUBCUTANEOUS
  Filled 2021-04-02: qty 15

## 2021-04-02 MED ORDER — SODIUM CHLORIDE 0.9 % IV SOLN: Freq: Once | INTRAVENOUS | Status: DC

## 2021-04-02 MED ORDER — SODIUM CHLORIDE 0.9% FLUSH
10.0000 mL | INTRAVENOUS | Status: DC | PRN
  Administered 2021-04-02: 10 mL

## 2021-04-02 NOTE — Progress Notes (Signed)
Hematology and Oncology Follow Up Visit  Robert [name] [id number] Feb 22, 1934 87 y.o. 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 3 weeks for hemoglobin less than 10   Interim History:  Robert [name] is here today for follow-up and treatment.  His weight is still holding steady.  He feels good.  He has no complaints.  The last time that we saw him, there is actually a monoclonal spike in his blood.  He had a 0.1 g/dL monoclonal spike.  I am not sure exactly what this indicates.  This could just be reactionary.  His last IgG level was 500 mg/dL.  The Kappa light chain was 1.2 mg/dL.  He has had no problems with cough or shortness of breath.  He has had no nausea or vomiting.  As always, his appetite is doing quite well.  He is just not exercising all that much.  He has had no change in bowel or bladder habits.  There is been no leg swelling.  He is trying to watch his fluid intake.  He has had no palpable lymph nodes.  He has had no headache.  Overall, I would say his performance status is probably ECOG 1.     Medications:  Allergies as of 04/02/2021   No Known Allergies      Medication List        Accurate as of April 02, 2021  1:14 PM. If you have any questions, ask your nurse or doctor.          aspirin 81 MG tablet Take 81 mg by mouth daily.   bimatoprost 0.01 % Soln Commonly known as: LUMIGAN Place 1 drop into both eyes at bedtime.   CALTRATE 600+D PLUS PO Take 1 tablet by mouth daily.   ciclopirox 0.77 % cream Commonly known as: LOPROX Apply  1 application topically 2 (two) times daily.   cloNIDine 0.1 MG tablet Commonly known as: CATAPRES Take 0.1 mg by mouth 2 (two) times daily as needed (Elevated BP).   cyanocobalamin 2000 MCG tablet Take 2,000 mcg by mouth at bedtime.   famciclovir 250 MG tablet Commonly known as: FAMVIR TAKE 1 TABLET DAILY   furosemide 20 MG tablet Commonly known as: LASIX Take 2 tablets (40 mg total) by mouth daily.   losartan 100 MG tablet Commonly known as: COZAAR Take 100 mg by mouth daily.   metolazone 5 MG tablet Commonly known as: ZAROXOLYN TAKE 1 TABLET 1 HOUR PRIOR TO TAKING LASIX DAILY   montelukast 10 MG tablet Commonly known as: Singulair Take 1 tablet (10 mg total) by mouth at bedtime.   multivitamin with minerals Tabs tablet Take 1 tablet by mouth daily. Unknown strenght   potassium chloride SA 20 MEQ tablet Commonly known as: KLOR-CON Take 20 mEq by mouth daily.   rosuvastatin 20 MG tablet Commonly known as: CRESTOR Take 20 mg by mouth at bedtime.   SYSTANE OP Place 1 drop into both eyes daily as needed (dry eyes).   Vitamin  D3 25 MCG (1000 UT) Caps Take 1,000 Units by mouth daily.        Allergies: No Known Allergies  Past Medical History, Surgical history, Social history, and Family History were reviewed and updated.  Review of Systems: Review of Systems  Constitutional: Negative.   HENT: Negative.    Eyes: Negative.   Respiratory: Negative.    Cardiovascular: Negative.   Gastrointestinal: Negative.   Genitourinary: Negative.   Musculoskeletal: Negative.   Skin: Negative.   Neurological: Negative.   Endo/Heme/Allergies: Negative.   Psychiatric/Behavioral: Negative.       Physical Exam:  weight is 216 lb (98 kg). His oral temperature is 97.9 F (36.6 C). His blood pressure is 161/80 (abnormal) and his pulse is 60. His respiration is 18 and oxygen saturation is 100%.   Wt Readings from Last 3 Encounters:  04/02/21 216 lb (98 kg)  03/04/21 216  lb 8 oz (98.2 kg)  02/25/21 217 lb (98.4 kg)    Physical Exam Vitals reviewed.  HENT:     Head: Normocephalic and atraumatic.  Eyes:     Pupils: Pupils are equal, round, and reactive to light.  Cardiovascular:     Rate and Rhythm: Normal rate and regular rhythm.     Heart sounds: Normal heart sounds.  Pulmonary:     Effort: Pulmonary effort is normal.     Breath sounds: Normal breath sounds.  Abdominal:     General: Bowel sounds are normal.     Palpations: Abdomen is soft.  Musculoskeletal:        General: No tenderness or deformity. Normal range of motion.     Cervical back: Normal range of motion.  Lymphadenopathy:     Cervical: No cervical adenopathy.  Skin:    General: Skin is warm and dry.     Findings: No erythema or rash.  Neurological:     Mental Status: He is alert and oriented to person, place, and time.  Psychiatric:        Behavior: Behavior normal.        Thought Content: Thought content normal.        Judgment: Judgment normal.     Lab Results  Component Value Date   WBC 5.9 04/02/2021   HGB 11.2 (L) 04/02/2021   HCT 33.2 (L) 04/02/2021   MCV 95.4 04/02/2021   PLT 208 04/02/2021   Lab Results  Component Value Date   FERRITIN 1,061 (H) 06/28/2019   IRON 82 06/28/2019   TIBC 281 06/28/2019   UIBC 200 06/28/2019   IRONPCTSAT 29 06/28/2019   Lab Results  Component Value Date   RETICCTPCT 2.2 04/05/2019   RBC 3.48 (L) 04/02/2021   Lab Results  Component Value Date   KPAFRELGTCHN 12.1 03/04/2021   LAMBDASER 4.4 (L) 03/04/2021   KAPLAMBRATIO 2.75 (H) 03/04/2021   Lab Results  Component Value Date   IGGSERUM 498 (L) 03/04/2021   IGA 21 (L) 03/04/2021   IGMSERUM 35 03/04/2021   Lab Results  Component Value Date   TOTALPROTELP 5.9 (L) 03/04/2021   ALBUMINELP 3.5 03/04/2021   A1GS 0.2 03/04/2021   A2GS 0.9 03/04/2021   BETS 0.8 03/04/2021   BETA2SER 0.3 01/03/2015   GAMS 0.5 03/04/2021   MSPIKE 0.1 (H) 03/04/2021   SPEI Comment  06/26/2020     Chemistry      Component Value Date/Time   NA 138 04/02/2021 1150   NA 137 04/08/2017 1141   NA 138 10/29/2015 1029   K  3.8 04/02/2021 1150   K 3.4 04/08/2017 1141   K 3.9 10/29/2015 1029   CL 102 04/02/2021 1150   CL 102 04/08/2017 1141   CO2 27 04/02/2021 1150   CO2 30 04/08/2017 1141   CO2 27 10/29/2015 1029   BUN 20 04/02/2021 1150   BUN 16 04/08/2017 1141   BUN 13.8 10/29/2015 1029   CREATININE 1.21 04/02/2021 1150   CREATININE 1.3 (H) 04/08/2017 1141   CREATININE 1.1 10/29/2015 1029      Component Value Date/Time   CALCIUM 9.8 04/02/2021 1150   CALCIUM 9.3 04/08/2017 1141   CALCIUM 9.3 10/29/2015 1029   ALKPHOS 44 04/02/2021 1150   ALKPHOS 66 04/08/2017 1141   ALKPHOS 52 10/29/2015 1029   AST 15 04/02/2021 1150   AST 20 10/29/2015 1029   ALT 11 04/02/2021 1150   ALT 21 04/08/2017 1141   ALT 14 10/29/2015 1029   BILITOT 0.7 04/02/2021 1150   BILITOT 0.75 10/29/2015 1029       Impression and Plan: Mr. Garrido is a very pleasant 87 yo African American gentleman with relapsed IgG kappa myeloma. He has history of stem cell transplant in 2006.   We will have to see what the monoclonal spike is today.  Hopefully able to be less or stable.  If so, we will just continue on the protocol.  If we do find that he does have a increase in the monoclonal spike, we may have to add something to his protocol.  I may consider pomalidomide as an oral agent.  We could consider Kyprolis.  I just am not all that worried at this point.  Again, we will have to see what his monoclonal spike shows.  We will still plan for follow-up in 4 weeks.  I am sure he will have a wonderful Thanksgiving with his beautiful wife.  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   Volanda Napoleon, MD 11/9/20221:14 PM

## 2021-04-02 NOTE — Addendum Note (Signed)
Addended by: Amelia Jo I on: 04/02/2021 01:52 PM   Modules accepted: Orders

## 2021-04-02 NOTE — Patient Instructions (Signed)

## 2021-04-02 NOTE — Patient Instructions (Addendum)
Daratumumab injection ?What is this medication? ?DARATUMUMAB (dar a toom ue mab) is a monoclonal antibody. It is used to treat multiple myeloma. ?This medicine may be used for other purposes; ask your health care provider or pharmacist if you have questions. ?COMMON BRAND NAME(S): DARZALEX ?What should I tell my care team before I take this medication? ?They need to know if you have any of these conditions: ?hereditary fructose intolerance ?infection (especially a virus infection such as chickenpox, herpes, or hepatitis B virus) ?lung or breathing disease (asthma, COPD) ?an unusual or allergic reaction to daratumumab, sorbitol, other medicines, foods, dyes, or preservatives ?pregnant or trying to get pregnant ?breast-feeding ?How should I use this medication? ?This medicine is for infusion into a vein. It is given by a health care professional in a hospital or clinic setting. ?Talk to your pediatrician regarding the use of this medicine in children. Special care may be needed. ?Overdosage: If you think you have taken too much of this medicine contact a poison control center or emergency room at once. ?NOTE: This medicine is only for you. Do not share this medicine with others. ?What if I miss a dose? ?Keep appointments for follow-up doses as directed. It is important not to miss your dose. Call your doctor or health care professional if you are unable to keep an appointment. ?What may interact with this medication? ?Interactions have not been studied. ?This list may not describe all possible interactions. Give your health care provider a list of all the medicines, herbs, non-prescription drugs, or dietary supplements you use. Also tell them if you smoke, drink alcohol, or use illegal drugs. Some items may interact with your medicine. ?What should I watch for while using this medication? ?Your condition will be monitored carefully while you are receiving this medicine. ?This medicine can cause serious allergic  reactions. To reduce your risk, your health care provider may give you other medicine to take before receiving this one. Be sure to follow the directions from your health care provider. ?This medicine can affect the results of blood tests to match your blood type. These changes can last for up to 6 months after the final dose. Your healthcare provider will do blood tests to match your blood type before you start treatment. Tell all of your healthcare providers that you are being treated with this medicine before receiving a blood transfusion. ?This medicine can affect the results of some tests used to determine treatment response; extra tests may be needed to evaluate response. ?Do not become pregnant while taking this medicine or for 3 months after stopping it. Women should inform their health care provider if they wish to become pregnant or think they might be pregnant. There is a potential for serious side effects to an unborn child. Talk to your health care provider for more information. Do not breast-feed an infant while taking this medicine. ?What side effects may I notice from receiving this medication? ?Side effects that you should report to your care team as soon as possible: ?Allergic reactions--skin rash, itching or hives, swelling of the face, lips, or tongue ?Blurred vision ?Infection--fever, chills, cough, sore throat, pain or trouble passing urine ?Infusion reactions--dizziness, fast heartbeat, feeling faint or lightheaded, falls, headache, increase in blood pressure, nausea, vomiting, or wheezing or trouble breathing with loud or whistling sounds ?Unusual bleeding or bruising ?Side effects that usually do not require medical attention (report to your care team if they continue or are bothersome): ?Constipation ?Diarrhea ?Pain, tingling, numbness in the hands   or feet ?Swelling of the ankles, feet, hands ?Tiredness ?This list may not describe all possible side effects. Call your doctor for medical  advice about side effects. You may report side effects to FDA at 1-800-FDA-1088. ?Where should I keep my medication? ?This drug is given in a hospital or clinic and will not be stored at home. ?NOTE: This sheet is a summary. It may not cover all possible information. If you have questions about this medicine, talk to your doctor, pharmacist, or health care provider. ?? 2022 Elsevier/Gold Standard (2020-10-17 00:00:00) ? ?

## 2021-04-03 LAB — KAPPA/LAMBDA LIGHT CHAINS
Kappa free light chain: 12.3 mg/L (ref 3.3–19.4)
Kappa, lambda light chain ratio: 2.05 — ABNORMAL HIGH (ref 0.26–1.65)
Lambda free light chains: 6 mg/L (ref 5.7–26.3)

## 2021-04-03 LAB — IGG, IGA, IGM
IgA: 19 mg/dL — ABNORMAL LOW (ref 61–437)
IgG (Immunoglobin G), Serum: 467 mg/dL — ABNORMAL LOW (ref 603–1613)
IgM (Immunoglobulin M), Srm: 28 mg/dL (ref 15–143)

## 2021-04-08 ENCOUNTER — Telehealth: Payer: Self-pay

## 2021-04-08 LAB — PROTEIN ELECTROPHORESIS, SERUM, WITH REFLEX
A/G Ratio: 1.5 (ref 0.7–1.7)
Albumin ELP: 3.4 g/dL (ref 2.9–4.4)
Alpha-1-Globulin: 0.2 g/dL (ref 0.0–0.4)
Alpha-2-Globulin: 0.8 g/dL (ref 0.4–1.0)
Beta Globulin: 0.7 g/dL (ref 0.7–1.3)
Gamma Globulin: 0.4 g/dL (ref 0.4–1.8)
Globulin, Total: 2.2 g/dL (ref 2.2–3.9)
M-Spike, %: 0.1 g/dL — ABNORMAL HIGH
SPEP Interpretation: 0
Total Protein ELP: 5.6 g/dL — ABNORMAL LOW (ref 6.0–8.5)

## 2021-04-08 LAB — IMMUNOFIXATION REFLEX, SERUM
IgA: 21 mg/dL — ABNORMAL LOW (ref 61–437)
IgG (Immunoglobin G), Serum: 511 mg/dL — ABNORMAL LOW (ref 603–1613)
IgM (Immunoglobulin M), Srm: 31 mg/dL (ref 15–143)

## 2021-04-08 NOTE — Telephone Encounter (Signed)
-----   Message from Robert Napoleon, MD sent at 04/08/2021  1:37 PM EST ----- Call - the protein is still very low at 0.1.  [name]

## 2021-04-08 NOTE — Telephone Encounter (Signed)
Responded via MyChart.

## 2021-04-30 ENCOUNTER — Inpatient Hospital Stay: Payer: Medicare Other | Attending: Hematology & Oncology

## 2021-04-30 ENCOUNTER — Encounter: Payer: Self-pay | Admitting: Family

## 2021-04-30 ENCOUNTER — Inpatient Hospital Stay: Payer: Medicare Other

## 2021-04-30 ENCOUNTER — Ambulatory Visit: Payer: Medicare Other

## 2021-04-30 ENCOUNTER — Inpatient Hospital Stay (HOSPITAL_BASED_OUTPATIENT_CLINIC_OR_DEPARTMENT_OTHER): Payer: Medicare Other | Admitting: Family

## 2021-04-30 ENCOUNTER — Other Ambulatory Visit: Payer: Self-pay

## 2021-04-30 VITALS — BP 147/65 | HR 62 | Temp 98.3°F | Resp 18 | Wt 214.0 lb

## 2021-04-30 DIAGNOSIS — C9002 Multiple myeloma in relapse: Secondary | ICD-10-CM | POA: Diagnosis present

## 2021-04-30 DIAGNOSIS — C9000 Multiple myeloma not having achieved remission: Secondary | ICD-10-CM

## 2021-04-30 DIAGNOSIS — M899 Disorder of bone, unspecified: Secondary | ICD-10-CM

## 2021-04-30 DIAGNOSIS — Z79899 Other long term (current) drug therapy: Secondary | ICD-10-CM | POA: Diagnosis not present

## 2021-04-30 DIAGNOSIS — Z5112 Encounter for antineoplastic immunotherapy: Secondary | ICD-10-CM | POA: Insufficient documentation

## 2021-04-30 DIAGNOSIS — D631 Anemia in chronic kidney disease: Secondary | ICD-10-CM

## 2021-04-30 DIAGNOSIS — N183 Chronic kidney disease, stage 3 unspecified: Secondary | ICD-10-CM

## 2021-04-30 LAB — CMP (CANCER CENTER ONLY)
ALT: 13 U/L (ref 0–44)
AST: 14 U/L — ABNORMAL LOW (ref 15–41)
Albumin: 4.1 g/dL (ref 3.5–5.0)
Alkaline Phosphatase: 49 U/L (ref 38–126)
Anion gap: 8 (ref 5–15)
BUN: 20 mg/dL (ref 8–23)
CO2: 29 mmol/L (ref 22–32)
Calcium: 10.6 mg/dL — ABNORMAL HIGH (ref 8.9–10.3)
Chloride: 102 mmol/L (ref 98–111)
Creatinine: 1.17 mg/dL (ref 0.61–1.24)
GFR, Estimated: >60 mL/min (ref >60)
Glucose, Bld: 119 mg/dL — ABNORMAL HIGH (ref 70–99)
Potassium: 3.7 mmol/L (ref 3.5–5.1)
Sodium: 139 mmol/L (ref 135–145)
Total Bilirubin: 0.8 mg/dL (ref 0.3–1.2)
Total Protein: 6 g/dL — ABNORMAL LOW (ref 6.5–8.1)

## 2021-04-30 LAB — CBC WITH DIFFERENTIAL (CANCER CENTER ONLY)
Abs Immature Granulocytes: 0.02 10*3/uL (ref 0.00–0.07)
Basophils Absolute: 0 10*3/uL (ref 0.0–0.1)
Basophils Relative: 0 %
Eosinophils Absolute: 0.1 10*3/uL (ref 0.0–0.5)
Eosinophils Relative: 2 %
HCT: 33.8 % — ABNORMAL LOW (ref 39.0–52.0)
Hemoglobin: 11.2 g/dL — ABNORMAL LOW (ref 13.0–17.0)
Immature Granulocytes: 0 %
Lymphocytes Relative: 39 %
Lymphs Abs: 2.3 10*3/uL (ref 0.7–4.0)
MCH: 31.5 pg (ref 26.0–34.0)
MCHC: 33.1 g/dL (ref 30.0–36.0)
MCV: 95.2 fL (ref 80.0–100.0)
Monocytes Absolute: 0.5 10*3/uL (ref 0.1–1.0)
Monocytes Relative: 9 %
Neutro Abs: 2.9 10*3/uL (ref 1.7–7.7)
Neutrophils Relative %: 50 %
Platelet Count: 195 10*3/uL (ref 150–400)
RBC: 3.55 MIL/uL — ABNORMAL LOW (ref 4.22–5.81)
RDW: 13.2 % (ref 11.5–15.5)
WBC Count: 5.9 10*3/uL (ref 4.0–10.5)
nRBC: 0 % (ref 0.0–0.2)

## 2021-04-30 LAB — LACTATE DEHYDROGENASE: LDH: 174 U/L (ref 98–192)

## 2021-04-30 MED ORDER — HEPARIN SOD (PORK) LOCK FLUSH 100 UNIT/ML IV SOLN: 500.0000 [IU] | Freq: Once | INTRAVENOUS | Status: DC | PRN

## 2021-04-30 MED ORDER — DEXAMETHASONE 4 MG PO TABS
20.0000 mg | ORAL_TABLET | Freq: Once | ORAL | Status: AC
  Administered 2021-04-30: 20 mg via ORAL

## 2021-04-30 MED ORDER — DARATUMUMAB-HYALURONIDASE-FIHJ 1800-30000 MG-UT/15ML ~~LOC~~ SOLN
1800.0000 mg | Freq: Once | SUBCUTANEOUS | Status: AC
  Administered 2021-04-30: 1800 mg via SUBCUTANEOUS
  Filled 2021-04-30: qty 15

## 2021-04-30 MED ORDER — SODIUM CHLORIDE 0.9% FLUSH: 10.0000 mL | INTRAVENOUS | Status: DC | PRN

## 2021-04-30 MED ORDER — SODIUM CHLORIDE 0.9 % IV SOLN: INTRAVENOUS | Status: DC

## 2021-04-30 MED ORDER — ZOLEDRONIC ACID 4 MG/100ML IV SOLN
4.0000 mg | Freq: Once | INTRAVENOUS | Status: AC
  Administered 2021-04-30: 4 mg via INTRAVENOUS
  Filled 2021-04-30: qty 100

## 2021-04-30 MED ORDER — ACETAMINOPHEN 325 MG PO TABS
650.0000 mg | ORAL_TABLET | Freq: Once | ORAL | Status: AC
  Administered 2021-04-30: 650 mg via ORAL

## 2021-04-30 MED ORDER — DIPHENHYDRAMINE HCL 25 MG PO CAPS
50.0000 mg | ORAL_CAPSULE | Freq: Once | ORAL | Status: AC
  Administered 2021-04-30: 50 mg via ORAL

## 2021-04-30 NOTE — Progress Notes (Signed)
Hematology and Oncology Follow Up Visit  TYRION GLAUDE [id number] 06-23-1933 87 y.o. 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 3 weeks for hemoglobin less than 10   Interim History:  Mr. Antkowiak is here today for follow-up and treatment. He is doing well but does note intermittent episodes of fatigue and takes a break to rest when needed.  In November M-spike was 0.1 g/dL, IgG level was 467 mg/dL and kappa light chains 1.23 mg/dL.  No blood loss noted. No bruising or petechiae.  He denies fever, chills, n/v, cough, rash, dizziness, SOB, chest pain, palpitations, abdominal pain or changes in bowel or bladder habits.  No swelling or tenderness in his extremities at this time.  He has occasional tingling in his hands that comes and goes.  No falls or syncope to report.  He is eating well and doing his best to stay hydrated. His weight is stable at 214 lbs.   ECOG Performance Status: 1 - Symptomatic but completely ambulatory  Medications:  Allergies as of 04/30/2021   No Known Allergies      Medication List        Accurate as of April 30, 2021 10:42 AM. If you have any questions, ask your nurse or doctor.          aspirin 81 MG tablet Take 81 mg by mouth daily.   bimatoprost 0.01 % Soln Commonly known as: LUMIGAN Place 1 drop into both eyes at bedtime.   CALTRATE 600+D PLUS PO Take 1 tablet by mouth daily.   ciclopirox 0.77 % cream Commonly known as: LOPROX Apply 1 application topically 2 (two) times daily. As needed   cloNIDine 0.1 MG  tablet Commonly known as: CATAPRES Take 0.1 mg by mouth 2 (two) times daily as needed (Elevated BP).   cyanocobalamin 2000 MCG tablet Take 2,000 mcg by mouth at bedtime.   famciclovir 250 MG tablet Commonly known as: FAMVIR TAKE 1 TABLET DAILY   furosemide 20 MG tablet Commonly known as: LASIX Take 2 tablets (40 mg total) by mouth daily.   losartan 100 MG tablet Commonly known as: COZAAR Take 100 mg by mouth daily.   metolazone 5 MG tablet Commonly known as: ZAROXOLYN TAKE 1 TABLET 1 HOUR PRIOR TO TAKING LASIX DAILY   montelukast 10 MG tablet Commonly known as: Singulair Take 1 tablet (10 mg total) by mouth at bedtime.   multivitamin with minerals Tabs tablet Take 1 tablet by mouth daily. Unknown strenght   potassium chloride SA 20 MEQ tablet Commonly known as: KLOR-CON M Take 20 mEq by mouth daily.   rosuvastatin 20 MG tablet Commonly known as: CRESTOR Take 20 mg by mouth at bedtime.   SYSTANE OP Place 1 drop into both eyes daily as needed (dry eyes).   Vitamin D3 25 MCG (1000 UT) Caps Take 1,000 Units by mouth daily.        Allergies: No Known Allergies  Past Medical History, Surgical history, Social history, and Family History were reviewed  and updated.  Review of Systems: All other 10 point review of systems is negative.   Physical Exam:  weight is 214 lb (97.1 kg). His oral temperature is 98.3 F (36.8 C). His blood pressure is 147/65 (abnormal) and his pulse is 62. His respiration is 18 and oxygen saturation is 100%.   Wt Readings from Last 3 Encounters:  04/30/21 214 lb (97.1 kg)  04/02/21 216 lb (98 kg)  03/04/21 216 lb 8 oz (98.2 kg)    Ocular: Sclerae unicteric, pupils equal, round and reactive to light Ear-nose-throat: Oropharynx clear, dentition fair Lymphatic: No cervical or supraclavicular adenopathy Lungs no rales or rhonchi, good excursion bilaterally Heart regular rate and rhythm, no murmur appreciated Abd soft, nontender,  positive bowel sounds MSK no focal spinal tenderness, no joint edema Neuro: non-focal, well-oriented, appropriate affect Breasts: Deferred   Lab Results  Component Value Date   WBC 5.9 04/30/2021   HGB 11.2 (L) 04/30/2021   HCT 33.8 (L) 04/30/2021   MCV 95.2 04/30/2021   PLT 195 04/30/2021   Lab Results  Component Value Date   FERRITIN 1,061 (H) 06/28/2019   IRON 82 06/28/2019   TIBC 281 06/28/2019   UIBC 200 06/28/2019   IRONPCTSAT 29 06/28/2019   Lab Results  Component Value Date   RETICCTPCT 2.2 04/05/2019   RBC 3.55 (L) 04/30/2021   Lab Results  Component Value Date   KPAFRELGTCHN 12.3 04/02/2021   LAMBDASER 6.0 04/02/2021   KAPLAMBRATIO 2.05 (H) 04/02/2021   Lab Results  Component Value Date   IGGSERUM 511 (L) 04/02/2021   IGA 21 (L) 04/02/2021   IGMSERUM 31 04/02/2021   Lab Results  Component Value Date   TOTALPROTELP 5.6 (L) 04/02/2021   ALBUMINELP 3.4 04/02/2021   A1GS 0.2 04/02/2021   A2GS 0.8 04/02/2021   BETS 0.7 04/02/2021   BETA2SER 0.3 01/03/2015   GAMS 0.4 04/02/2021   MSPIKE 0.1 (H) 04/02/2021   SPEI Comment 06/26/2020     Chemistry      Component Value Date/Time   NA 138 04/02/2021 1150   NA 137 04/08/2017 1141   NA 138 10/29/2015 1029   K 3.8 04/02/2021 1150   K 3.4 04/08/2017 1141   K 3.9 10/29/2015 1029   CL 102 04/02/2021 1150   CL 102 04/08/2017 1141   CO2 27 04/02/2021 1150   CO2 30 04/08/2017 1141   CO2 27 10/29/2015 1029   BUN 20 04/02/2021 1150   BUN 16 04/08/2017 1141   BUN 13.8 10/29/2015 1029   CREATININE 1.21 04/02/2021 1150   CREATININE 1.3 (H) 04/08/2017 1141   CREATININE 1.1 10/29/2015 1029      Component Value Date/Time   CALCIUM 9.8 04/02/2021 1150   CALCIUM 9.3 04/08/2017 1141   CALCIUM 9.3 10/29/2015 1029   ALKPHOS 44 04/02/2021 1150   ALKPHOS 66 04/08/2017 1141   ALKPHOS 52 10/29/2015 1029   AST 15 04/02/2021 1150   AST 20 10/29/2015 1029   ALT 11 04/02/2021 1150   ALT 21 04/08/2017 1141   ALT 14  10/29/2015 1029   BILITOT 0.7 04/02/2021 1150   BILITOT 0.75 10/29/2015 1029       Impression and Plan: Mr. Wohl is a very pleasant 87 yo African American gentleman with relapsed IgG kappa myeloma. He has history of stem cell transplant in 2006.  Protein studies are pending.  We will proceed with treatment including Zometa today as planned.  Follow-up in 4 weeks.   Lottie Dawson, NP 12/7/202210:42  AM

## 2021-04-30 NOTE — Patient Instructions (Signed)

## 2021-04-30 NOTE — Patient Instructions (Signed)
[facility]  Discharge Instructions: Thank you for choosing [facility] to provide your oncology and hematology care.   If you have a lab appointment with the [facility], please go directly to the [facility] and check in at the registration area.  Wear comfortable clothing and clothing appropriate for easy access to any Portacath or PICC line.   We strive to give you quality time with your provider. You may need to reschedule your appointment if you arrive late (15 or more minutes).  Arriving late affects you and other patients whose appointments are after yours.  Also, if you miss three or more appointments without notifying the office, you may be dismissed from the clinic at the provider's discretion.      For prescription refill requests, have your pharmacy contact our office and allow 72 hours for refills to be completed.    Today you received the following chemotherapy and/or immunotherapy agents Darzalex       To help prevent nausea and vomiting after your treatment, we encourage you to take your nausea medication as directed.  BELOW ARE SYMPTOMS THAT SHOULD BE REPORTED IMMEDIATELY: *FEVER GREATER THAN 100.4 F (38 C) OR HIGHER *CHILLS OR SWEATING *NAUSEA AND VOMITING THAT IS NOT CONTROLLED WITH YOUR NAUSEA MEDICATION *UNUSUAL SHORTNESS OF BREATH *UNUSUAL BRUISING OR BLEEDING *URINARY PROBLEMS (pain or burning when urinating, or frequent urination) *BOWEL PROBLEMS (unusual diarrhea, constipation, pain near the anus) TENDERNESS IN MOUTH AND THROAT WITH OR WITHOUT PRESENCE OF ULCERS (sore throat, sores in mouth, or a toothache) UNUSUAL RASH, SWELLING OR PAIN  UNUSUAL VAGINAL DISCHARGE OR ITCHING   Items with * indicate a potential emergency and should be followed up as soon as possible or go to the Emergency Department if any problems should occur.  Please show the CHEMOTHERAPY ALERT CARD or IMMUNOTHERAPY ALERT CARD at check-in to the  Emergency Department and triage nurse. Should you have questions after your visit or need to cancel or reschedule your appointment, please contact [facility]  [phone] and follow the prompts.  Office hours are 8:00 a.m. to 4:30 p.m. Monday - Friday. Please note that voicemails left after 4:00 p.m. may not be returned until the following business day.  We are closed weekends and major holidays. You have access to a nurse at all times for urgent questions. Please call the main number to the clinic [phone] and follow the prompts.  For any non-urgent questions, you may also contact your provider using MyChart. We now offer e-Visits for anyone 67 and older to request care online for non-urgent symptoms. For details visit [name].   Also download the MyChart app! Go to the app store, search "MyChart", open the app, select , and log in with your MyChart username and password.  Due to Covid, a mask is required upon entering the hospital/clinic. If you do not have a mask, one will be given to you upon arrival. For doctor visits, patients may have 1 support person aged 66 or older with them. For treatment visits, patients cannot have anyone with them due to current Covid guidelines and our immunocompromised population.

## 2021-05-01 LAB — KAPPA/LAMBDA LIGHT CHAINS
Kappa free light chain: 10.3 mg/L (ref 3.3–19.4)
Kappa, lambda light chain ratio: 2.19 — ABNORMAL HIGH (ref 0.26–1.65)
Lambda free light chains: 4.7 mg/L — ABNORMAL LOW (ref 5.7–26.3)

## 2021-05-01 LAB — IGG, IGA, IGM
IgA: 24 mg/dL — ABNORMAL LOW (ref 61–437)
IgG (Immunoglobin G), Serum: 522 mg/dL — ABNORMAL LOW (ref 603–1613)
IgM (Immunoglobulin M), Srm: 32 mg/dL (ref 15–143)

## 2021-05-02 ENCOUNTER — Telehealth: Payer: Self-pay | Admitting: *Deleted

## 2021-05-02 NOTE — Telephone Encounter (Signed)
Per 04/30/21 los - called and lvm of upcoming appointment- requested call back to confirm

## 2021-05-05 LAB — PROTEIN ELECTROPHORESIS, SERUM, WITH REFLEX
A/G Ratio: 1.5 (ref 0.7–1.7)
Albumin ELP: 3.5 g/dL (ref 2.9–4.4)
Alpha-1-Globulin: 0.2 g/dL (ref 0.0–0.4)
Alpha-2-Globulin: 0.8 g/dL (ref 0.4–1.0)
Beta Globulin: 0.9 g/dL (ref 0.7–1.3)
Gamma Globulin: 0.5 g/dL (ref 0.4–1.8)
Globulin, Total: 2.4 g/dL (ref 2.2–3.9)
M-Spike, %: 0.1 g/dL — ABNORMAL HIGH
SPEP Interpretation: 0
Total Protein ELP: 5.9 g/dL — ABNORMAL LOW (ref 6.0–8.5)

## 2021-05-05 LAB — IMMUNOFIXATION REFLEX, SERUM
IgA: 19 mg/dL — ABNORMAL LOW (ref 61–437)
IgG (Immunoglobin G), Serum: 528 mg/dL — ABNORMAL LOW (ref 603–1613)
IgM (Immunoglobulin M), Srm: 37 mg/dL (ref 15–143)

## 2021-05-25 ENCOUNTER — Encounter: Payer: Self-pay | Admitting: Family

## 2021-05-25 ENCOUNTER — Encounter: Payer: Self-pay | Admitting: Hematology & Oncology

## 2021-05-28 ENCOUNTER — Inpatient Hospital Stay: Payer: Medicare Other

## 2021-05-28 ENCOUNTER — Inpatient Hospital Stay: Payer: Medicare Other | Attending: Hematology & Oncology

## 2021-05-28 ENCOUNTER — Other Ambulatory Visit: Payer: Self-pay

## 2021-05-28 ENCOUNTER — Encounter: Payer: Self-pay | Admitting: Family

## 2021-05-28 ENCOUNTER — Inpatient Hospital Stay (HOSPITAL_BASED_OUTPATIENT_CLINIC_OR_DEPARTMENT_OTHER): Payer: Medicare Other | Admitting: Family

## 2021-05-28 VITALS — BP 145/70 | HR 56 | Temp 97.6°F | Resp 20

## 2021-05-28 VITALS — BP 160/72 | HR 64 | Temp 98.3°F | Resp 19 | Ht 71.0 in | Wt 214.0 lb

## 2021-05-28 DIAGNOSIS — C9000 Multiple myeloma not having achieved remission: Secondary | ICD-10-CM | POA: Diagnosis not present

## 2021-05-28 DIAGNOSIS — N183 Chronic kidney disease, stage 3 unspecified: Secondary | ICD-10-CM | POA: Diagnosis not present

## 2021-05-28 DIAGNOSIS — D630 Anemia in neoplastic disease: Secondary | ICD-10-CM | POA: Diagnosis not present

## 2021-05-28 DIAGNOSIS — D631 Anemia in chronic kidney disease: Secondary | ICD-10-CM | POA: Diagnosis not present

## 2021-05-28 DIAGNOSIS — Z5112 Encounter for antineoplastic immunotherapy: Secondary | ICD-10-CM | POA: Insufficient documentation

## 2021-05-28 DIAGNOSIS — Z79899 Other long term (current) drug therapy: Secondary | ICD-10-CM | POA: Diagnosis not present

## 2021-05-28 DIAGNOSIS — C9002 Multiple myeloma in relapse: Secondary | ICD-10-CM | POA: Insufficient documentation

## 2021-05-28 DIAGNOSIS — M899 Disorder of bone, unspecified: Secondary | ICD-10-CM | POA: Diagnosis not present

## 2021-05-28 LAB — CMP (CANCER CENTER ONLY)
ALT: 11 U/L (ref 0–44)
AST: 16 U/L (ref 15–41)
Albumin: 3.9 g/dL (ref 3.5–5.0)
Alkaline Phosphatase: 48 U/L (ref 38–126)
Anion gap: 7 (ref 5–15)
BUN: 24 mg/dL — ABNORMAL HIGH (ref 8–23)
CO2: 29 mmol/L (ref 22–32)
Calcium: 9.8 mg/dL (ref 8.9–10.3)
Chloride: 102 mmol/L (ref 98–111)
Creatinine: 1.26 mg/dL — ABNORMAL HIGH (ref 0.61–1.24)
GFR, Estimated: 56 mL/min — ABNORMAL LOW (ref >60)
Glucose, Bld: 95 mg/dL (ref 70–99)
Potassium: 3.4 mmol/L — ABNORMAL LOW (ref 3.5–5.1)
Sodium: 138 mmol/L (ref 135–145)
Total Bilirubin: 0.7 mg/dL (ref 0.3–1.2)
Total Protein: 5.6 g/dL — ABNORMAL LOW (ref 6.5–8.1)

## 2021-05-28 LAB — CBC WITH DIFFERENTIAL (CANCER CENTER ONLY)
Abs Immature Granulocytes: 0.02 10*3/uL (ref 0.00–0.07)
Basophils Absolute: 0 10*3/uL (ref 0.0–0.1)
Basophils Relative: 1 %
Eosinophils Absolute: 0.1 10*3/uL (ref 0.0–0.5)
Eosinophils Relative: 2 %
HCT: 32.1 % — ABNORMAL LOW (ref 39.0–52.0)
Hemoglobin: 10.9 g/dL — ABNORMAL LOW (ref 13.0–17.0)
Immature Granulocytes: 0 %
Lymphocytes Relative: 35 %
Lymphs Abs: 2 10*3/uL (ref 0.7–4.0)
MCH: 32.1 pg (ref 26.0–34.0)
MCHC: 34 g/dL (ref 30.0–36.0)
MCV: 94.4 fL (ref 80.0–100.0)
Monocytes Absolute: 0.7 10*3/uL (ref 0.1–1.0)
Monocytes Relative: 11 %
Neutro Abs: 3 10*3/uL (ref 1.7–7.7)
Neutrophils Relative %: 51 %
Platelet Count: 216 10*3/uL (ref 150–400)
RBC: 3.4 MIL/uL — ABNORMAL LOW (ref 4.22–5.81)
RDW: 13.5 % (ref 11.5–15.5)
WBC Count: 5.8 10*3/uL (ref 4.0–10.5)
nRBC: 0 % (ref 0.0–0.2)

## 2021-05-28 LAB — LACTATE DEHYDROGENASE: LDH: 197 U/L — ABNORMAL HIGH (ref 98–192)

## 2021-05-28 MED ORDER — SODIUM CHLORIDE 0.9% FLUSH
10.0000 mL | INTRAVENOUS | Status: DC | PRN
  Administered 2021-05-28: 10 mL

## 2021-05-28 MED ORDER — DIPHENHYDRAMINE HCL 25 MG PO CAPS
50.0000 mg | ORAL_CAPSULE | Freq: Once | ORAL | Status: AC
  Administered 2021-05-28: 50 mg via ORAL
  Filled 2021-05-28: qty 2

## 2021-05-28 MED ORDER — DEXAMETHASONE 4 MG PO TABS
20.0000 mg | ORAL_TABLET | Freq: Once | ORAL | Status: AC
  Administered 2021-05-28: 20 mg via ORAL
  Filled 2021-05-28: qty 5

## 2021-05-28 MED ORDER — HEPARIN SOD (PORK) LOCK FLUSH 100 UNIT/ML IV SOLN
500.0000 [IU] | Freq: Once | INTRAVENOUS | Status: AC | PRN
  Administered 2021-05-28: 500 [IU]

## 2021-05-28 MED ORDER — ACETAMINOPHEN 325 MG PO TABS
650.0000 mg | ORAL_TABLET | Freq: Once | ORAL | Status: AC
  Administered 2021-05-28: 650 mg via ORAL
  Filled 2021-05-28: qty 2

## 2021-05-28 MED ORDER — DARATUMUMAB-HYALURONIDASE-FIHJ 1800-30000 MG-UT/15ML ~~LOC~~ SOLN
1800.0000 mg | Freq: Once | SUBCUTANEOUS | Status: AC
  Administered 2021-05-28: 1800 mg via SUBCUTANEOUS
  Filled 2021-05-28: qty 15

## 2021-05-28 MED ORDER — SODIUM CHLORIDE 0.9 % IV SOLN: Freq: Once | INTRAVENOUS | Status: DC

## 2021-05-28 NOTE — Progress Notes (Signed)
Hematology and Oncology Follow Up Visit  Robert [name] [id number] 09-26-1933 88 y.o. 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 3 weeks for hemoglobin less than 10   Interim History:  Robert [name] is here today for follow-up and treatment. He is doing well but notes some fatigue at this time.  Last month, M-spike was 0.2 g/dL, IgG level was 522 mg/dL and kappa light chains 1.03 mg/dL.  No fever, chills, n/v, cough, rash, dizziness, SOB, chest pain, palpitations, abdominal pain or changes in bowel or bladder habits.  No blood loss noted. No bruising or petechiae.  No swelling or tenderness in his extremities at this time.  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 Unchanged from baseline.  No falls or syncope to report.  He has maintained a good appetite but admits that he needs to stay better hydrated. His weight is stable at 214 lbs.   ECOG Performance Status: 1 - Symptomatic but completely ambulatory  Medications:  Allergies as of 05/28/2021   No Known Allergies      Medication List        Accurate as of May 28, 2021  9:57 AM. If you have any questions, ask your nurse or doctor.          aspirin 81 MG tablet Take 81 mg by mouth daily.   bimatoprost 0.01 % Soln Commonly known as: LUMIGAN Place 1 drop into both eyes at bedtime.   CALTRATE 600+D PLUS PO Take 1 tablet by mouth daily.   ciclopirox 0.77 % cream Commonly known as: LOPROX Apply 1 application topically 2 (two) times daily. As needed   cloNIDine 0.1 MG tablet Commonly known as:  CATAPRES Take 0.1 mg by mouth 2 (two) times daily as needed (Elevated BP).   cyanocobalamin 2000 MCG tablet Take 2,000 mcg by mouth at bedtime.   famciclovir 250 MG tablet Commonly known as: FAMVIR TAKE 1 TABLET DAILY   furosemide 20 MG tablet Commonly known as: LASIX Take 2 tablets (40 mg total) by mouth daily.   losartan 100 MG tablet Commonly known as: COZAAR Take 100 mg by mouth daily.   metolazone 5 MG tablet Commonly known as: ZAROXOLYN TAKE 1 TABLET 1 HOUR PRIOR TO TAKING LASIX DAILY   montelukast 10 MG tablet Commonly known as: Singulair Take 1 tablet (10 mg total) by mouth at bedtime.   multivitamin with minerals Tabs tablet Take 1 tablet by mouth daily. Unknown strenght   potassium chloride SA 20 MEQ tablet Commonly known as: KLOR-CON M Take 20 mEq by mouth daily.   rosuvastatin 20 MG tablet Commonly known as: CRESTOR Take 20 mg by mouth at bedtime.   SYSTANE OP Place 1 drop into both eyes daily as needed (dry eyes).   Vitamin D3 25 MCG (1000 UT) Caps Take 1,000 Units by mouth daily.        Allergies: No Known Allergies  Past Medical History, Surgical history, Social history, and Family History were reviewed and updated.  Review  of Systems: All other 10 point review of systems is negative.   Physical Exam:  vitals were not taken for this visit.   Wt Readings from Last 3 Encounters:  04/30/21 214 lb (97.1 kg)  04/02/21 216 lb (98 kg)  03/04/21 216 lb 8 oz (98.2 kg)    Ocular: Sclerae unicteric, pupils equal, round and reactive to light Ear-nose-throat: Oropharynx clear, dentition fair Lymphatic: No cervical or supraclavicular adenopathy Lungs no rales or rhonchi, good excursion bilaterally Heart regular rate and rhythm, no murmur appreciated Abd soft, nontender, positive bowel sounds MSK no focal spinal tenderness, no joint edema Neuro: non-focal, well-oriented, appropriate affect Breasts: Deferred  Lab Results  Component Value Date    WBC 5.9 04/30/2021   HGB 11.2 (L) 04/30/2021   HCT 33.8 (L) 04/30/2021   MCV 95.2 04/30/2021   PLT 195 04/30/2021   Lab Results  Component Value Date   FERRITIN 1,061 (H) 06/28/2019   IRON 82 06/28/2019   TIBC 281 06/28/2019   UIBC 200 06/28/2019   IRONPCTSAT 29 06/28/2019   Lab Results  Component Value Date   RETICCTPCT 2.2 04/05/2019   RBC 3.55 (L) 04/30/2021   Lab Results  Component Value Date   KPAFRELGTCHN 10.3 04/30/2021   LAMBDASER 4.7 (L) 04/30/2021   KAPLAMBRATIO 2.19 (H) 04/30/2021   Lab Results  Component Value Date   IGGSERUM 522 (L) 04/30/2021   IGGSERUM 528 (L) 04/30/2021   IGA 24 (L) 04/30/2021   IGA 19 (L) 04/30/2021   IGMSERUM 32 04/30/2021   IGMSERUM 37 04/30/2021   Lab Results  Component Value Date   TOTALPROTELP 5.9 (L) 04/30/2021   ALBUMINELP 3.5 04/30/2021   A1GS 0.2 04/30/2021   A2GS 0.8 04/30/2021   BETS 0.9 04/30/2021   BETA2SER 0.3 01/03/2015   GAMS 0.5 04/30/2021   MSPIKE 0.1 (H) 04/30/2021   SPEI Comment 06/26/2020     Chemistry      Component Value Date/Time   NA 139 04/30/2021 1020   NA 137 04/08/2017 1141   NA 138 10/29/2015 1029   K 3.7 04/30/2021 1020   K 3.4 04/08/2017 1141   K 3.9 10/29/2015 1029   CL 102 04/30/2021 1020   CL 102 04/08/2017 1141   CO2 29 04/30/2021 1020   CO2 30 04/08/2017 1141   CO2 27 10/29/2015 1029   BUN 20 04/30/2021 1020   BUN 16 04/08/2017 1141   BUN 13.8 10/29/2015 1029   CREATININE 1.17 04/30/2021 1020   CREATININE 1.3 (H) 04/08/2017 1141   CREATININE 1.1 10/29/2015 1029      Component Value Date/Time   CALCIUM 10.6 (H) 04/30/2021 1020   CALCIUM 9.3 04/08/2017 1141   CALCIUM 9.3 10/29/2015 1029   ALKPHOS 49 04/30/2021 1020   ALKPHOS 66 04/08/2017 1141   ALKPHOS 52 10/29/2015 1029   AST 14 (L) 04/30/2021 1020   AST 20 10/29/2015 1029   ALT 13 04/30/2021 1020   ALT 21 04/08/2017 1141   ALT 14 10/29/2015 1029   BILITOT 0.8 04/30/2021 1020   BILITOT 0.75 10/29/2015 1029        Impression and Plan:  Robert [name] is a very pleasant 88 yo African American gentleman with relapsed IgG kappa myeloma. He has history of stem cell transplant in 2006.  Protein studies are pending.  We will proceed with treatment today as planned.  Follow-up in 4 weeks.   Lottie Dawson, NP 1/4/20239:57 AM

## 2021-05-28 NOTE — Patient Instructions (Signed)
[facility]  Discharge Instructions: Thank you for choosing [facility] to provide your oncology and hematology care.   If you have a lab appointment with the [facility], please go directly to the [facility] and check in at the registration area.  Wear comfortable clothing and clothing appropriate for easy access to any Portacath or PICC line.   We strive to give you quality time with your provider. You may need to reschedule your appointment if you arrive late (15 or more minutes).  Arriving late affects you and other patients whose appointments are after yours.  Also, if you miss three or more appointments without notifying the office, you may be dismissed from the clinic at the providers discretion.      For prescription refill requests, have your pharmacy contact our office and allow 72 hours for refills to be completed.    Today you received the following chemotherapy and/or immunotherapy agents Darzalex      To help prevent nausea and vomiting after your treatment, we encourage you to take your nausea medication as directed.  BELOW ARE SYMPTOMS THAT SHOULD BE REPORTED IMMEDIATELY: *FEVER GREATER THAN 100.4 F (38 C) OR HIGHER *CHILLS OR SWEATING *NAUSEA AND VOMITING THAT IS NOT CONTROLLED WITH YOUR NAUSEA MEDICATION *UNUSUAL SHORTNESS OF BREATH *UNUSUAL BRUISING OR BLEEDING *URINARY PROBLEMS (pain or burning when urinating, or frequent urination) *BOWEL PROBLEMS (unusual diarrhea, constipation, pain near the anus) TENDERNESS IN MOUTH AND THROAT WITH OR WITHOUT PRESENCE OF ULCERS (sore throat, sores in mouth, or a toothache) UNUSUAL RASH, SWELLING OR PAIN  UNUSUAL VAGINAL DISCHARGE OR ITCHING   Items with * indicate a potential emergency and should be followed up as soon as possible or go to the Emergency Department if any problems should occur.  Please show the CHEMOTHERAPY ALERT CARD or IMMUNOTHERAPY ALERT CARD at check-in to the  Emergency Department and triage nurse. Should you have questions after your visit or need to cancel or reschedule your appointment, please contact [facility]  [phone] and follow the prompts.  Office hours are 8:00 a.m. to 4:30 p.m. Monday - Friday. Please note that voicemails left after 4:00 p.m. may not be returned until the following business day.  We are closed weekends and major holidays. You have access to a nurse at all times for urgent questions. Please call the main number to the clinic [phone] and follow the prompts.  For any non-urgent questions, you may also contact your provider using MyChart. We now offer e-Visits for anyone 60 and older to request care online for non-urgent symptoms. For details visit [name].   Also download the MyChart app! Go to the app store, search "MyChart", open the app, select [facility], and log in with your MyChart username and password.  Due to Covid, a mask is required upon entering the hospital/clinic. If you do not have a mask, one will be given to you upon arrival. For doctor visits, patients may have 1 support person aged 45 or older with them. For treatment visits, patients cannot have anyone with them due to current Covid guidelines and our immunocompromised population.

## 2021-05-28 NOTE — Patient Instructions (Signed)

## 2021-05-29 LAB — IGG, IGA, IGM
IgA: 25 mg/dL — ABNORMAL LOW (ref 61–437)
IgG (Immunoglobin G), Serum: 527 mg/dL — ABNORMAL LOW (ref 603–1613)
IgM (Immunoglobulin M), Srm: 33 mg/dL (ref 15–143)

## 2021-05-29 LAB — PROTEIN ELECTROPHORESIS, SERUM
A/G Ratio: 1.4 (ref 0.7–1.7)
Albumin ELP: 3.3 g/dL (ref 2.9–4.4)
Alpha-1-Globulin: 0.2 g/dL (ref 0.0–0.4)
Alpha-2-Globulin: 0.9 g/dL (ref 0.4–1.0)
Beta Globulin: 0.8 g/dL (ref 0.7–1.3)
Gamma Globulin: 0.5 g/dL (ref 0.4–1.8)
Globulin, Total: 2.4 g/dL (ref 2.2–3.9)
M-Spike, %: 0.1 g/dL — ABNORMAL HIGH
Total Protein ELP: 5.7 g/dL — ABNORMAL LOW (ref 6.0–8.5)

## 2021-05-29 LAB — KAPPA/LAMBDA LIGHT CHAINS
Kappa free light chain: 13.9 mg/L (ref 3.3–19.4)
Kappa, lambda light chain ratio: 0.96 (ref 0.26–1.65)
Lambda free light chains: 14.5 mg/L (ref 5.7–26.3)

## 2021-05-30 ENCOUNTER — Telehealth: Payer: Self-pay | Admitting: *Deleted

## 2021-05-30 NOTE — Telephone Encounter (Signed)
No 05/28/21 LOS

## 2021-06-04 ENCOUNTER — Telehealth: Payer: Self-pay | Admitting: *Deleted

## 2021-06-04 NOTE — Telephone Encounter (Signed)
Per 05/28/21 los called and gave upcoming appointments - confirmed

## 2021-06-18 ENCOUNTER — Encounter: Payer: Self-pay | Admitting: Family

## 2021-06-18 ENCOUNTER — Encounter: Payer: Self-pay | Admitting: Hematology & Oncology

## 2021-06-25 ENCOUNTER — Inpatient Hospital Stay: Payer: Medicare Other | Attending: Hematology & Oncology

## 2021-06-25 ENCOUNTER — Inpatient Hospital Stay: Payer: Medicare Other

## 2021-06-25 ENCOUNTER — Other Ambulatory Visit: Payer: Self-pay

## 2021-06-25 ENCOUNTER — Inpatient Hospital Stay (HOSPITAL_BASED_OUTPATIENT_CLINIC_OR_DEPARTMENT_OTHER): Payer: Medicare Other | Admitting: Family

## 2021-06-25 ENCOUNTER — Encounter: Payer: Self-pay | Admitting: Family

## 2021-06-25 VITALS — BP 151/84 | HR 50 | Temp 98.0°F | Resp 18 | Ht 71.0 in | Wt 216.0 lb

## 2021-06-25 VITALS — BP 145/75 | HR 57 | Resp 17

## 2021-06-25 DIAGNOSIS — Z79899 Other long term (current) drug therapy: Secondary | ICD-10-CM | POA: Diagnosis not present

## 2021-06-25 DIAGNOSIS — Z5112 Encounter for antineoplastic immunotherapy: Secondary | ICD-10-CM | POA: Diagnosis not present

## 2021-06-25 DIAGNOSIS — C9000 Multiple myeloma not having achieved remission: Secondary | ICD-10-CM

## 2021-06-25 DIAGNOSIS — D631 Anemia in chronic kidney disease: Secondary | ICD-10-CM | POA: Diagnosis not present

## 2021-06-25 DIAGNOSIS — D469 Myelodysplastic syndrome, unspecified: Secondary | ICD-10-CM | POA: Diagnosis not present

## 2021-06-25 DIAGNOSIS — N183 Chronic kidney disease, stage 3 unspecified: Secondary | ICD-10-CM | POA: Diagnosis not present

## 2021-06-25 DIAGNOSIS — D630 Anemia in neoplastic disease: Secondary | ICD-10-CM | POA: Insufficient documentation

## 2021-06-25 DIAGNOSIS — M899 Disorder of bone, unspecified: Secondary | ICD-10-CM

## 2021-06-25 DIAGNOSIS — C9002 Multiple myeloma in relapse: Secondary | ICD-10-CM | POA: Diagnosis present

## 2021-06-25 LAB — CBC WITH DIFFERENTIAL (CANCER CENTER ONLY)
Abs Immature Granulocytes: 0.02 10*3/uL (ref 0.00–0.07)
Basophils Absolute: 0 10*3/uL (ref 0.0–0.1)
Basophils Relative: 0 %
Eosinophils Absolute: 0.1 10*3/uL (ref 0.0–0.5)
Eosinophils Relative: 2 %
HCT: 34.3 % — ABNORMAL LOW (ref 39.0–52.0)
Hemoglobin: 11.3 g/dL — ABNORMAL LOW (ref 13.0–17.0)
Immature Granulocytes: 0 %
Lymphocytes Relative: 38 %
Lymphs Abs: 2.4 10*3/uL (ref 0.7–4.0)
MCH: 31.4 pg (ref 26.0–34.0)
MCHC: 32.9 g/dL (ref 30.0–36.0)
MCV: 95.3 fL (ref 80.0–100.0)
Monocytes Absolute: 0.6 10*3/uL (ref 0.1–1.0)
Monocytes Relative: 9 %
Neutro Abs: 3.3 10*3/uL (ref 1.7–7.7)
Neutrophils Relative %: 51 %
Platelet Count: 204 10*3/uL (ref 150–400)
RBC: 3.6 MIL/uL — ABNORMAL LOW (ref 4.22–5.81)
RDW: 13.2 % (ref 11.5–15.5)
WBC Count: 6.4 10*3/uL (ref 4.0–10.5)
nRBC: 0 % (ref 0.0–0.2)

## 2021-06-25 LAB — LACTATE DEHYDROGENASE: LDH: 178 U/L (ref 98–192)

## 2021-06-25 LAB — CMP (CANCER CENTER ONLY)
ALT: 11 U/L (ref 0–44)
AST: 15 U/L (ref 15–41)
Albumin: 4.1 g/dL (ref 3.5–5.0)
Alkaline Phosphatase: 49 U/L (ref 38–126)
Anion gap: 7 (ref 5–15)
BUN: 19 mg/dL (ref 8–23)
CO2: 28 mmol/L (ref 22–32)
Calcium: 10.2 mg/dL (ref 8.9–10.3)
Chloride: 101 mmol/L (ref 98–111)
Creatinine: 1.19 mg/dL (ref 0.61–1.24)
GFR, Estimated: 60 mL/min — ABNORMAL LOW (ref >60)
Glucose, Bld: 99 mg/dL (ref 70–99)
Potassium: 3.7 mmol/L (ref 3.5–5.1)
Sodium: 136 mmol/L (ref 135–145)
Total Bilirubin: 0.7 mg/dL (ref 0.3–1.2)
Total Protein: 6 g/dL — ABNORMAL LOW (ref 6.5–8.1)

## 2021-06-25 MED ORDER — HEPARIN SOD (PORK) LOCK FLUSH 100 UNIT/ML IV SOLN
500.0000 [IU] | Freq: Once | INTRAVENOUS | Status: AC | PRN
  Administered 2021-06-25: 500 [IU]

## 2021-06-25 MED ORDER — DARATUMUMAB-HYALURONIDASE-FIHJ 1800-30000 MG-UT/15ML ~~LOC~~ SOLN
1800.0000 mg | Freq: Once | SUBCUTANEOUS | Status: AC
  Administered 2021-06-25: 1800 mg via SUBCUTANEOUS
  Filled 2021-06-25: qty 15

## 2021-06-25 MED ORDER — ACETAMINOPHEN 325 MG PO TABS
650.0000 mg | ORAL_TABLET | Freq: Once | ORAL | Status: AC
  Administered 2021-06-25: 650 mg via ORAL
  Filled 2021-06-25: qty 2

## 2021-06-25 MED ORDER — SODIUM CHLORIDE 0.9% FLUSH
10.0000 mL | INTRAVENOUS | Status: DC | PRN
  Administered 2021-06-25: 10 mL

## 2021-06-25 MED ORDER — DEXAMETHASONE 4 MG PO TABS
20.0000 mg | ORAL_TABLET | Freq: Once | ORAL | Status: AC
  Administered 2021-06-25: 20 mg via ORAL
  Filled 2021-06-25: qty 5

## 2021-06-25 MED ORDER — DIPHENHYDRAMINE HCL 25 MG PO CAPS
50.0000 mg | ORAL_CAPSULE | Freq: Once | ORAL | Status: AC
  Administered 2021-06-25: 50 mg via ORAL
  Filled 2021-06-25: qty 2

## 2021-06-25 NOTE — Patient Instructions (Signed)

## 2021-06-25 NOTE — Patient Instructions (Addendum)
[facility]  Discharge Instructions: Thank you for choosing [facility] to provide your oncology and hematology care.   If you have a lab appointment with the [facility], please go directly to the [facility] and check in at the registration area.  Wear comfortable clothing and clothing appropriate for easy access to any Portacath or PICC line.   We strive to give you quality time with your provider. You may need to reschedule your appointment if you arrive late (15 or more minutes).  Arriving late affects you and other patients whose appointments are after yours.  Also, if you miss three or more appointments without notifying the office, you may be dismissed from the clinic at the providers discretion.      For prescription refill requests, have your pharmacy contact our office and allow 72 hours for refills to be completed.    Today you received the following chemotherapy and/or immunotherapy agents Darzalex      To help prevent nausea and vomiting after your treatment, we encourage you to take your nausea medication as directed.  BELOW ARE SYMPTOMS THAT SHOULD BE REPORTED IMMEDIATELY: *FEVER GREATER THAN 100.4 F (38 C) OR HIGHER *CHILLS OR SWEATING *NAUSEA AND VOMITING THAT IS NOT CONTROLLED WITH YOUR NAUSEA MEDICATION *UNUSUAL SHORTNESS OF BREATH *UNUSUAL BRUISING OR BLEEDING *URINARY PROBLEMS (pain or burning when urinating, or frequent urination) *BOWEL PROBLEMS (unusual diarrhea, constipation, pain near the anus) TENDERNESS IN MOUTH AND THROAT WITH OR WITHOUT PRESENCE OF ULCERS (sore throat, sores in mouth, or a toothache) UNUSUAL RASH, SWELLING OR PAIN  UNUSUAL VAGINAL DISCHARGE OR ITCHING   Items with * indicate a potential emergency and should be followed up as soon as possible or go to the Emergency Department if any problems should occur.  Please show the CHEMOTHERAPY ALERT CARD or IMMUNOTHERAPY ALERT CARD at check-in to the  Emergency Department and triage nurse. Should you have questions after your visit or need to cancel or reschedule your appointment, please contact [facility]  [phone] and follow the prompts.  Office hours are 8:00 a.m. to 4:30 p.m. Monday - Friday. Please note that voicemails left after 4:00 p.m. may not be returned until the following business day.  We are closed weekends and major holidays. You have access to a nurse at all times for urgent questions. Please call the main number to the clinic [phone] and follow the prompts.  For any non-urgent questions, you may also contact your provider using MyChart. We now offer e-Visits for anyone 31 and older to request care online for non-urgent symptoms. For details visit [name].   Also download the MyChart app! Go to the app store, search "MyChart", open the app, select [facility], and log in with your MyChart username and password.  Due to Covid, a mask is required upon entering the hospital/clinic. If you do not have a mask, one will be given to you upon arrival. For doctor visits, patients may have 1 support person aged 44 or older with them. For treatment visits, patients cannot have anyone with them due to current Covid guidelines and our immunocompromised population.

## 2021-06-25 NOTE — Progress Notes (Signed)
Hematology and Oncology Follow Up Visit  Robert [name] [id number] 1934-02-05 88 y.o. 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 3 weeks for hemoglobin less than 10   Interim History:  Robert [name] is here today for follow-up and treatment. He is doing well and has no complaints at this time. Earlier this month M-spike was 0.1 g/dL, IgG level was 527 mg/dL and kappa light chains were 1.39 mg/dL.  He notes occasional fatigue and will take a break to rest when needed.  No blood loss, bruising or petechiae noted.  He denies fever, chills, n/v, cough, rash, dizziness, SOB, chest pain, palpitations, abdominal pain or changes in bowel or bladder habits.  No swelling in his extremities at this time.  He has tingling and pain at times in the right hand with arthritis.  No falls or syncope to report.  He has been eating well but states that he needs to better hydrate throughout the day.   ECOG Performance Status: 1 - Symptomatic but completely ambulatory  Medications:  Allergies as of 06/25/2021   No Known Allergies      Medication List        Accurate as of June 25, 2021 11:35 AM. If you have any questions, ask your nurse or doctor.          aspirin 81 MG tablet Take 81 mg by mouth daily.   bimatoprost 0.01 % Soln Commonly known as: LUMIGAN Place 1 drop into both eyes at bedtime.   CALTRATE 600+D PLUS PO Take 1 tablet by mouth daily.   ciclopirox 0.77 % cream Commonly known as: LOPROX Apply 1 application topically 2 (two) times daily. As needed   cloNIDine  0.1 MG tablet Commonly known as: CATAPRES Take 0.1 mg by mouth 2 (two) times daily as needed (Elevated BP).   cyanocobalamin 2000 MCG tablet Take 2,000 mcg by mouth at bedtime.   famciclovir 250 MG tablet Commonly known as: FAMVIR TAKE 1 TABLET DAILY   furosemide 20 MG tablet Commonly known as: LASIX Take 2 tablets (40 mg total) by mouth daily.   losartan 100 MG tablet Commonly known as: COZAAR Take 100 mg by mouth daily.   metolazone 5 MG tablet Commonly known as: ZAROXOLYN TAKE 1 TABLET 1 HOUR PRIOR TO TAKING LASIX DAILY   montelukast 10 MG tablet Commonly known as: Singulair Take 1 tablet (10 mg total) by mouth at bedtime.   multivitamin with minerals Tabs tablet Take 1 tablet by mouth daily. Unknown strenght   potassium chloride SA 20 MEQ tablet Commonly known as: KLOR-CON M Take 20 mEq by mouth daily.   rosuvastatin 20 MG tablet Commonly known as: CRESTOR Take 20 mg by mouth at bedtime.   SYSTANE OP Place 1 drop into both eyes daily as needed (dry eyes).   Vitamin D3 25 MCG (1000 UT) Caps Take 1,000 Units by mouth daily.        Allergies: No Known Allergies  Past Medical History, Surgical history, Social history, and  Family History were reviewed and updated.  Review of Systems: All other 10 point review of systems is negative.   Physical Exam:  height is 5\' 11"  (1.803 m) and weight is 216 lb (98 kg). His oral temperature is 98 F (36.7 C). His blood pressure is 151/84 (abnormal) and his pulse is 50 (abnormal). His respiration is 18 and oxygen saturation is 100%.   Wt Readings from Last 3 Encounters:  06/25/21 216 lb (98 kg)  05/28/21 214 lb (97.1 kg)  04/30/21 214 lb (97.1 kg)    Ocular: Sclerae unicteric, pupils equal, round and reactive to light Ear-nose-throat: Oropharynx clear, dentition fair Lymphatic: No cervical or supraclavicular adenopathy Lungs no rales or rhonchi, good excursion bilaterally Heart regular rate and rhythm, no murmur  appreciated Abd soft, nontender, positive bowel sounds MSK no focal spinal tenderness, no joint edema Neuro: non-focal, well-oriented, appropriate affect Breasts: Deferred   Lab Results  Component Value Date   WBC 6.4 06/25/2021   HGB 11.3 (L) 06/25/2021   HCT 34.3 (L) 06/25/2021   MCV 95.3 06/25/2021   PLT 204 06/25/2021   Lab Results  Component Value Date   FERRITIN 1,061 (H) 06/28/2019   IRON 82 06/28/2019   TIBC 281 06/28/2019   UIBC 200 06/28/2019   IRONPCTSAT 29 06/28/2019   Lab Results  Component Value Date   RETICCTPCT 2.2 04/05/2019   RBC 3.60 (L) 06/25/2021   Lab Results  Component Value Date   KPAFRELGTCHN 13.9 05/28/2021   LAMBDASER 14.5 05/28/2021   KAPLAMBRATIO 0.96 05/28/2021   Lab Results  Component Value Date   IGGSERUM 527 (L) 05/28/2021   IGA 25 (L) 05/28/2021   IGMSERUM 33 05/28/2021   Lab Results  Component Value Date   TOTALPROTELP 5.7 (L) 05/28/2021   ALBUMINELP 3.3 05/28/2021   A1GS 0.2 05/28/2021   A2GS 0.9 05/28/2021   BETS 0.8 05/28/2021   BETA2SER 0.3 01/03/2015   GAMS 0.5 05/28/2021   MSPIKE 0.1 (H) 05/28/2021   SPEI Comment 05/28/2021     Chemistry      Component Value Date/Time   NA 138 05/28/2021 0940   NA 137 04/08/2017 1141   NA 138 10/29/2015 1029   K 3.4 (L) 05/28/2021 0940   K 3.4 04/08/2017 1141   K 3.9 10/29/2015 1029   CL 102 05/28/2021 0940   CL 102 04/08/2017 1141   CO2 29 05/28/2021 0940   CO2 30 04/08/2017 1141   CO2 27 10/29/2015 1029   BUN 24 (H) 05/28/2021 0940   BUN 16 04/08/2017 1141   BUN 13.8 10/29/2015 1029   CREATININE 1.26 (H) 05/28/2021 0940   CREATININE 1.3 (H) 04/08/2017 1141   CREATININE 1.1 10/29/2015 1029      Component Value Date/Time   CALCIUM 9.8 05/28/2021 0940   CALCIUM 9.3 04/08/2017 1141   CALCIUM 9.3 10/29/2015 1029   ALKPHOS 48 05/28/2021 0940   ALKPHOS 66 04/08/2017 1141   ALKPHOS 52 10/29/2015 1029   AST 16 05/28/2021 0940   AST 20 10/29/2015 1029   ALT 11 05/28/2021  0940   ALT 21 04/08/2017 1141   ALT 14 10/29/2015 1029   BILITOT 0.7 05/28/2021 0940   BILITOT 0.75 10/29/2015 1029       Impression and Plan: Robert [name] is a very pleasant 88 yo African American gentleman with relapsed IgG kappa myeloma. He has history of stem cell transplant in 2006.  Protein studies pending.  We will proceed with treatment today as planned.  Follow-up  in 4 weeks.   Lottie Dawson, NP 2/1/202311:35 AM

## 2021-06-26 LAB — KAPPA/LAMBDA LIGHT CHAINS
Kappa free light chain: 13.2 mg/L (ref 3.3–19.4)
Kappa, lambda light chain ratio: 1.57 (ref 0.26–1.65)
Lambda free light chains: 8.4 mg/L (ref 5.7–26.3)

## 2021-06-26 LAB — IGG, IGA, IGM
IgA: 22 mg/dL — ABNORMAL LOW (ref 61–437)
IgG (Immunoglobin G), Serum: 558 mg/dL — ABNORMAL LOW (ref 603–1613)
IgM (Immunoglobulin M), Srm: 35 mg/dL (ref 15–143)

## 2021-06-27 ENCOUNTER — Other Ambulatory Visit: Payer: Medicare Other

## 2021-06-27 ENCOUNTER — Ambulatory Visit: Payer: Medicare Other | Admitting: Family

## 2021-06-27 ENCOUNTER — Ambulatory Visit: Payer: Medicare Other

## 2021-06-27 LAB — PROTEIN ELECTROPHORESIS, SERUM
A/G Ratio: 1.5 (ref 0.7–1.7)
Albumin ELP: 3.5 g/dL (ref 2.9–4.4)
Alpha-1-Globulin: 0.2 g/dL (ref 0.0–0.4)
Alpha-2-Globulin: 0.9 g/dL (ref 0.4–1.0)
Beta Globulin: 0.8 g/dL (ref 0.7–1.3)
Gamma Globulin: 0.5 g/dL (ref 0.4–1.8)
Globulin, Total: 2.4 g/dL (ref 2.2–3.9)
Total Protein ELP: 5.9 g/dL — ABNORMAL LOW (ref 6.0–8.5)

## 2021-07-23 ENCOUNTER — Other Ambulatory Visit: Payer: Medicare Other

## 2021-07-23 ENCOUNTER — Ambulatory Visit: Payer: Medicare Other | Admitting: Family

## 2021-07-23 ENCOUNTER — Other Ambulatory Visit: Payer: Self-pay

## 2021-07-23 ENCOUNTER — Inpatient Hospital Stay: Payer: Medicare Other | Attending: Hematology & Oncology

## 2021-07-23 ENCOUNTER — Inpatient Hospital Stay: Payer: Medicare Other

## 2021-07-23 ENCOUNTER — Inpatient Hospital Stay (HOSPITAL_BASED_OUTPATIENT_CLINIC_OR_DEPARTMENT_OTHER): Payer: Medicare Other | Admitting: Family

## 2021-07-23 ENCOUNTER — Ambulatory Visit: Payer: Medicare Other

## 2021-07-23 ENCOUNTER — Encounter: Payer: Self-pay | Admitting: Family

## 2021-07-23 VITALS — BP 140/74 | HR 59 | Temp 97.6°F | Resp 20

## 2021-07-23 VITALS — BP 152/84 | HR 66 | Temp 98.0°F | Resp 17 | Ht 71.0 in | Wt 217.0 lb

## 2021-07-23 DIAGNOSIS — N183 Chronic kidney disease, stage 3 unspecified: Secondary | ICD-10-CM

## 2021-07-23 DIAGNOSIS — C9000 Multiple myeloma not having achieved remission: Secondary | ICD-10-CM

## 2021-07-23 DIAGNOSIS — Z79899 Other long term (current) drug therapy: Secondary | ICD-10-CM | POA: Diagnosis not present

## 2021-07-23 DIAGNOSIS — D509 Iron deficiency anemia, unspecified: Secondary | ICD-10-CM

## 2021-07-23 DIAGNOSIS — Z5112 Encounter for antineoplastic immunotherapy: Secondary | ICD-10-CM | POA: Insufficient documentation

## 2021-07-23 DIAGNOSIS — M899 Disorder of bone, unspecified: Secondary | ICD-10-CM

## 2021-07-23 DIAGNOSIS — C9002 Multiple myeloma in relapse: Secondary | ICD-10-CM | POA: Diagnosis present

## 2021-07-23 DIAGNOSIS — D631 Anemia in chronic kidney disease: Secondary | ICD-10-CM

## 2021-07-23 LAB — CMP (CANCER CENTER ONLY)
ALT: 11 U/L (ref 0–44)
AST: 15 U/L (ref 15–41)
Albumin: 3.8 g/dL (ref 3.5–5.0)
Alkaline Phosphatase: 43 U/L (ref 38–126)
Anion gap: 7 (ref 5–15)
BUN: 27 mg/dL — ABNORMAL HIGH (ref 8–23)
CO2: 30 mmol/L (ref 22–32)
Calcium: 9.8 mg/dL (ref 8.9–10.3)
Chloride: 102 mmol/L (ref 98–111)
Creatinine: 1.22 mg/dL (ref 0.61–1.24)
GFR, Estimated: 58 mL/min — ABNORMAL LOW (ref >60)
Glucose, Bld: 89 mg/dL (ref 70–99)
Potassium: 3.5 mmol/L (ref 3.5–5.1)
Sodium: 139 mmol/L (ref 135–145)
Total Bilirubin: 0.6 mg/dL (ref 0.3–1.2)
Total Protein: 5.9 g/dL — ABNORMAL LOW (ref 6.5–8.1)

## 2021-07-23 LAB — CBC WITH DIFFERENTIAL (CANCER CENTER ONLY)
Abs Immature Granulocytes: 0.02 K/uL (ref 0.00–0.07)
Basophils Absolute: 0 K/uL (ref 0.0–0.1)
Basophils Relative: 1 %
Eosinophils Absolute: 0.2 K/uL (ref 0.0–0.5)
Eosinophils Relative: 3 %
HCT: 33.8 % — ABNORMAL LOW (ref 39.0–52.0)
Hemoglobin: 11.4 g/dL — ABNORMAL LOW (ref 13.0–17.0)
Immature Granulocytes: 0 %
Lymphocytes Relative: 38 %
Lymphs Abs: 2.1 K/uL (ref 0.7–4.0)
MCH: 31.8 pg (ref 26.0–34.0)
MCHC: 33.7 g/dL (ref 30.0–36.0)
MCV: 94.4 fL (ref 80.0–100.0)
Monocytes Absolute: 0.6 K/uL (ref 0.1–1.0)
Monocytes Relative: 11 %
Neutro Abs: 2.7 K/uL (ref 1.7–7.7)
Neutrophils Relative %: 47 %
Platelet Count: 197 K/uL (ref 150–400)
RBC: 3.58 MIL/uL — ABNORMAL LOW (ref 4.22–5.81)
RDW: 13.1 % (ref 11.5–15.5)
WBC Count: 5.6 K/uL (ref 4.0–10.5)
nRBC: 0 % (ref 0.0–0.2)

## 2021-07-23 LAB — IRON AND IRON BINDING CAPACITY (CC-WL,HP ONLY)
Iron: 84 ug/dL (ref 45–182)
Saturation Ratios: 26 % (ref 17.9–39.5)
TIBC: 325 ug/dL (ref 250–450)
UIBC: 241 ug/dL (ref 117–376)

## 2021-07-23 LAB — LACTATE DEHYDROGENASE: LDH: 180 U/L (ref 98–192)

## 2021-07-23 LAB — FERRITIN: Ferritin: 555 ng/mL — ABNORMAL HIGH (ref 24–336)

## 2021-07-23 MED ORDER — ACETAMINOPHEN 325 MG PO TABS
650.0000 mg | ORAL_TABLET | Freq: Once | ORAL | Status: AC
  Administered 2021-07-23: 650 mg via ORAL
  Filled 2021-07-23: qty 2

## 2021-07-23 MED ORDER — SODIUM CHLORIDE 0.9% FLUSH
10.0000 mL | INTRAVENOUS | Status: DC | PRN
  Administered 2021-07-23: 10 mL

## 2021-07-23 MED ORDER — DARATUMUMAB-HYALURONIDASE-FIHJ 1800-30000 MG-UT/15ML ~~LOC~~ SOLN
1800.0000 mg | Freq: Once | SUBCUTANEOUS | Status: AC
  Administered 2021-07-23: 1800 mg via SUBCUTANEOUS
  Filled 2021-07-23: qty 15

## 2021-07-23 MED ORDER — HEPARIN SOD (PORK) LOCK FLUSH 100 UNIT/ML IV SOLN
500.0000 [IU] | Freq: Once | INTRAVENOUS | Status: AC | PRN
  Administered 2021-07-23: 500 [IU]

## 2021-07-23 MED ORDER — DIPHENHYDRAMINE HCL 25 MG PO CAPS
50.0000 mg | ORAL_CAPSULE | Freq: Once | ORAL | Status: AC
  Administered 2021-07-23: 50 mg via ORAL
  Filled 2021-07-23: qty 2

## 2021-07-23 MED ORDER — SODIUM CHLORIDE 0.9 % IV SOLN: Freq: Once | INTRAVENOUS | Status: DC

## 2021-07-23 MED ORDER — DEXAMETHASONE 4 MG PO TABS
20.0000 mg | ORAL_TABLET | Freq: Once | ORAL | Status: AC
  Administered 2021-07-23: 20 mg via ORAL
  Filled 2021-07-23: qty 5

## 2021-07-23 NOTE — Patient Instructions (Signed)

## 2021-07-23 NOTE — Patient Instructions (Signed)
[facility]  Discharge Instructions: ?Thank you for choosing [facility] to provide your oncology and hematology care.  ? ?If you have a lab appointment with the [facility], please go directly to the [facility] and check in at the registration area. ? ?Wear comfortable clothing and clothing appropriate for easy access to any Portacath or PICC line.  ? ?We strive to give you quality time with your provider. You may need to reschedule your appointment if you arrive late (15 or more minutes).  Arriving late affects you and other patients whose appointments are after yours.  Also, if you miss three or more appointments without notifying the office, you may be dismissed from the clinic at the provider?s discretion.    ?  ?For prescription refill requests, have your pharmacy contact our office and allow 72 hours for refills to be completed.   ? ?Today you received the following chemotherapy and/or immunotherapy agents Darzalex     ?  ?To help prevent nausea and vomiting after your treatment, we encourage you to take your nausea medication as directed. ? ?BELOW ARE SYMPTOMS THAT SHOULD BE REPORTED IMMEDIATELY: ?*FEVER GREATER THAN 100.4 F (38 ?C) OR HIGHER ?*CHILLS OR SWEATING ?*NAUSEA AND VOMITING THAT IS NOT CONTROLLED WITH YOUR NAUSEA MEDICATION ?*UNUSUAL SHORTNESS OF BREATH ?*UNUSUAL BRUISING OR BLEEDING ?*URINARY PROBLEMS (pain or burning when urinating, or frequent urination) ?*BOWEL PROBLEMS (unusual diarrhea, constipation, pain near the anus) ?TENDERNESS IN MOUTH AND THROAT WITH OR WITHOUT PRESENCE OF ULCERS (sore throat, sores in mouth, or a toothache) ?UNUSUAL RASH, SWELLING OR PAIN  ?UNUSUAL VAGINAL DISCHARGE OR ITCHING  ? ?Items with * indicate a potential emergency and should be followed up as soon as possible or go to the Emergency Department if any problems should occur. ? ?Please show the CHEMOTHERAPY ALERT CARD or IMMUNOTHERAPY ALERT CARD at check-in to the  Emergency Department and triage nurse. ?Should you have questions after your visit or need to cancel or reschedule your appointment, please contact [facility]  [phone] and follow the prompts.  Office hours are 8:00 a.m. to 4:30 p.m. Monday - Friday. Please note that voicemails left after 4:00 p.m. may not be returned until the following business day.  We are closed weekends and major holidays. You have access to a nurse at all times for urgent questions. Please call the main number to the clinic [phone] and follow the prompts. ? ?For any non-urgent questions, you may also contact your provider using MyChart. We now offer e-Visits for anyone 10 and older to request care online for non-urgent symptoms. For details visit [name]. ?  ?Also download the MyChart app! Go to the app store, search "MyChart", open the app, select [facility], and log in with your MyChart username and password. ? ?Due to Covid, a mask is required upon entering the hospital/clinic. If you do not have a mask, one will be given to you upon arrival. For doctor visits, patients may have 1 support person aged 39 or older with them. For treatment visits, patients cannot have anyone with them due to current Covid guidelines and our immunocompromised population.  ?

## 2021-07-23 NOTE — Progress Notes (Signed)
Cbc and cmet reviewed by MD, VO  " ok to treat despite counts"per MD ?

## 2021-07-23 NOTE — Progress Notes (Addendum)
?Hematology and Oncology Follow Up Visit ? ?Robert [name] ?[id number] ?Oct 07, 1933 88 y.o. ?07/23/2021 ? ? 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  ?Current Therapy:        ?Daratumumab -- start on 11/16/2018 -- s/p cycle 28 ?Zometa 4 mg IV q 4 months - next dose on 08/2021  ?Aranesp 300 mcg subcu q 3 weeks for hemoglobin less than 10 ?  ?Interim History:  Robert [name] is here today for follow-up and treatment. He is doing well but does note fatigue at times. He will get out and exercise and take a break when needed.  ?No M-spike observed in February, IgG level was 558 mg/dL and kappa light chains 1.32 mg/dL.  ?No fever, chills, n/v, rash, dizziness, SOB, chest pain, palpitations, abdominal pain or changes in bowel or bladder habits.  ?He has an occasional cough with clear phlegm in the mornings.  ?No blood loss noted. No bruising or petechiae.  ?No swelling in his extremities.  ?Tingling in the fingertips of his right hand is intermittent and unchanged from baseline.  ?He has tenderness in the right knee which he attributes to arthritis.  ?No falls or syncope.  ?She has maintained a good appetite and is staying well hydrated. Her weight is stable at 217 lbs.  ? ?ECOG Performance Status: 1 - Symptomatic but completely ambulatory ? ?Medications:  ?Allergies as of 07/23/2021   ?No Known Allergies ?  ? ?  ?Medication List  ?  ? ?  ? Accurate as of July 23, 2021  9:47 AM. If you have any questions, ask your nurse or doctor.  ?  ?  ? ?  ? ?aspirin 81 MG tablet ?Take 81 mg by mouth daily. ?  ?bimatoprost 0.01 % Soln ?Commonly known as: LUMIGAN ?Place 1 drop into both eyes at bedtime. ?  ?CALTRATE 600+D PLUS PO ?Take 1 tablet by mouth daily. ?  ?ciclopirox 0.77 %  cream ?Commonly known as: LOPROX ?Apply 1 application topically 2 (two) times daily. As needed ?  ?cloNIDine 0.1 MG tablet ?Commonly known as: CATAPRES ?Take 0.1 mg by mouth 2 (two) times daily as needed (Elevated BP). ?  ?cyanocobalamin 2000 MCG tablet ?Take 2,000 mcg by mouth at bedtime. ?  ?famciclovir 250 MG tablet ?Commonly known as: FAMVIR ?TAKE 1 TABLET DAILY ?  ?furosemide 20 MG tablet ?Commonly known as: LASIX ?Take 2 tablets (40 mg total) by mouth daily. ?  ?losartan 100 MG tablet ?Commonly known as: COZAAR ?Take 100 mg by mouth daily. ?  ?metolazone 5 MG tablet ?Commonly known as: ZAROXOLYN ?TAKE 1 TABLET 1 HOUR PRIOR TO TAKING LASIX DAILY ?  ?montelukast 10 MG tablet ?Commonly known as: Singulair ?Take 1 tablet (10 mg total) by mouth at bedtime. ?  ?multivitamin with minerals Tabs tablet ?Take 1 tablet by mouth daily. Unknown strenght ?  ?potassium chloride SA 20 MEQ tablet ?Commonly known as: KLOR-CON M ?Take 20 mEq by mouth daily. ?  ?rosuvastatin 20 MG tablet ?Commonly known as: CRESTOR ?Take 20 mg by mouth at bedtime. ?  ?SYSTANE OP ?Place 1 drop into both eyes daily as needed (dry eyes). ?  ?Vitamin D3 25 MCG (1000 UT) Caps ?Take 1,000 Units by mouth daily. ?  ? ?  ? ? ?  Allergies: No Known Allergies ? ?Past Medical History, Surgical history, Social history, and Family History were reviewed and updated. ? ?Review of Systems: ?All other 10 point review of systems is negative.  ? ?Physical Exam: ? height is 5\' 11"  (1.803 m) and weight is 217 lb (98.4 kg). His oral temperature is 98 ?F (36.7 ?C). His blood pressure is 152/84 (abnormal) and his pulse is 66. His respiration is 17 and oxygen saturation is 100%.  ? ?Wt Readings from Last 3 Encounters:  ?07/23/21 217 lb (98.4 kg)  ?06/25/21 216 lb (98 kg)  ?05/28/21 214 lb (97.1 kg)  ? ? ?Ocular: Sclerae unicteric, pupils equal, round and reactive to light ?Ear-nose-throat: Oropharynx clear, dentition fair ?Lymphatic: No cervical or supraclavicular  adenopathy ?Lungs no rales or rhonchi, good excursion bilaterally ?Heart regular rate and rhythm, no murmur appreciated ?Abd soft, nontender, positive bowel sounds ?MSK no focal spinal tenderness, no joint edema ?Neuro: non-focal, well-oriented, appropriate affect ?Breasts: Deferred  ? ?Lab Results  ?Component Value Date  ? WBC 5.6 07/23/2021  ? HGB 11.4 (L) 07/23/2021  ? HCT 33.8 (L) 07/23/2021  ? MCV 94.4 07/23/2021  ? PLT 197 07/23/2021  ? ?Lab Results  ?Component Value Date  ? FERRITIN 1,061 (H) 06/28/2019  ? IRON 82 06/28/2019  ? TIBC 281 06/28/2019  ? UIBC 200 06/28/2019  ? IRONPCTSAT 29 06/28/2019  ? ?Lab Results  ?Component Value Date  ? RETICCTPCT 2.2 04/05/2019  ? RBC 3.58 (L) 07/23/2021  ? ?Lab Results  ?Component Value Date  ? KPAFRELGTCHN 13.2 06/25/2021  ? LAMBDASER 8.4 06/25/2021  ? KAPLAMBRATIO 1.57 06/25/2021  ? ?Lab Results  ?Component Value Date  ? IGGSERUM 558 (L) 06/25/2021  ? IGA 22 (L) 06/25/2021  ? IGMSERUM 35 06/25/2021  ? ?Lab Results  ?Component Value Date  ? TOTALPROTELP 5.9 (L) 06/25/2021  ? ALBUMINELP 3.5 06/25/2021  ? A1GS 0.2 06/25/2021  ? A2GS 0.9 06/25/2021  ? BETS 0.8 06/25/2021  ? BETA2SER 0.3 01/03/2015  ? GAMS 0.5 06/25/2021  ? MSPIKE Not Observed 06/25/2021  ? SPEI Comment 06/25/2021  ? ?  Chemistry   ?   ?Component Value Date/Time  ? NA 136 06/25/2021 1115  ? NA 137 04/08/2017 1141  ? NA 138 10/29/2015 1029  ? K 3.7 06/25/2021 1115  ? K 3.4 04/08/2017 1141  ? K 3.9 10/29/2015 1029  ? CL 101 06/25/2021 1115  ? CL 102 04/08/2017 1141  ? CO2 28 06/25/2021 1115  ? CO2 30 04/08/2017 1141  ? CO2 27 10/29/2015 1029  ? BUN 19 06/25/2021 1115  ? BUN 16 04/08/2017 1141  ? BUN 13.8 10/29/2015 1029  ? CREATININE 1.19 06/25/2021 1115  ? CREATININE 1.3 (H) 04/08/2017 1141  ? CREATININE 1.1 10/29/2015 1029  ?    ?Component Value Date/Time  ? CALCIUM 10.2 06/25/2021 1115  ? CALCIUM 9.3 04/08/2017 1141  ? CALCIUM 9.3 10/29/2015 1029  ? ALKPHOS 49 06/25/2021 1115  ? ALKPHOS 66 04/08/2017 1141   ? ALKPHOS 52 10/29/2015 1029  ? AST 15 06/25/2021 1115  ? AST 20 10/29/2015 1029  ? ALT 11 06/25/2021 1115  ? ALT 21 04/08/2017 1141  ? ALT 14 10/29/2015 1029  ? BILITOT 0.7 06/25/2021 1115  ? BILITOT 0.75 10/29/2015 1029  ?  ? ? ? ?Impression and Plan: Robert [name] is a very pleasant 88 yo African American gentleman with relapsed IgG kappa myeloma. He has history of stem cell transplant in 2006.   ?We will proceed with  treatment today as planned.  ?Protein studies are pending.  ?Follow-up in 4 weeks.  ? ?Lottie Dawson, NP ?3/1/20239:47 AM ? ?

## 2021-07-24 LAB — KAPPA/LAMBDA LIGHT CHAINS
Kappa free light chain: 14.5 mg/L (ref 3.3–19.4)
Kappa, lambda light chain ratio: 2.13 — ABNORMAL HIGH (ref 0.26–1.65)
Lambda free light chains: 6.8 mg/L (ref 5.7–26.3)

## 2021-07-24 LAB — IGG, IGA, IGM
IgA: 22 mg/dL — ABNORMAL LOW (ref 61–437)
IgG (Immunoglobin G), Serum: 534 mg/dL — ABNORMAL LOW (ref 603–1613)
IgM (Immunoglobulin M), Srm: 39 mg/dL (ref 15–143)

## 2021-07-25 LAB — PROTEIN ELECTROPHORESIS, SERUM
A/G Ratio: 1.6 (ref 0.7–1.7)
Albumin ELP: 3.6 g/dL (ref 2.9–4.4)
Alpha-1-Globulin: 0.2 g/dL (ref 0.0–0.4)
Alpha-2-Globulin: 0.9 g/dL (ref 0.4–1.0)
Beta Globulin: 0.8 g/dL (ref 0.7–1.3)
Gamma Globulin: 0.5 g/dL (ref 0.4–1.8)
Globulin, Total: 2.3 g/dL (ref 2.2–3.9)
Total Protein ELP: 5.9 g/dL — ABNORMAL LOW (ref 6.0–8.5)

## 2021-07-29 ENCOUNTER — Telehealth: Payer: Self-pay | Admitting: *Deleted

## 2021-07-29 NOTE — Telephone Encounter (Signed)
Per 07/23/21 los - called and gave upcoming appointments - requested call back to confirm ?

## 2021-08-20 ENCOUNTER — Inpatient Hospital Stay (HOSPITAL_BASED_OUTPATIENT_CLINIC_OR_DEPARTMENT_OTHER): Payer: Medicare Other | Admitting: Family

## 2021-08-20 ENCOUNTER — Encounter: Payer: Self-pay | Admitting: Family

## 2021-08-20 ENCOUNTER — Inpatient Hospital Stay: Payer: Medicare Other

## 2021-08-20 ENCOUNTER — Other Ambulatory Visit: Payer: Self-pay

## 2021-08-20 VITALS — BP 146/87 | HR 64 | Temp 97.9°F | Resp 18 | Wt 220.1 lb

## 2021-08-20 VITALS — BP 161/73 | HR 53 | Temp 97.9°F | Resp 17

## 2021-08-20 DIAGNOSIS — C9000 Multiple myeloma not having achieved remission: Secondary | ICD-10-CM

## 2021-08-20 DIAGNOSIS — N183 Chronic kidney disease, stage 3 unspecified: Secondary | ICD-10-CM | POA: Diagnosis not present

## 2021-08-20 DIAGNOSIS — Z5112 Encounter for antineoplastic immunotherapy: Secondary | ICD-10-CM | POA: Diagnosis not present

## 2021-08-20 DIAGNOSIS — M899 Disorder of bone, unspecified: Secondary | ICD-10-CM

## 2021-08-20 DIAGNOSIS — D509 Iron deficiency anemia, unspecified: Secondary | ICD-10-CM | POA: Diagnosis not present

## 2021-08-20 DIAGNOSIS — D631 Anemia in chronic kidney disease: Secondary | ICD-10-CM

## 2021-08-20 LAB — CMP (CANCER CENTER ONLY)
ALT: 12 U/L (ref 0–44)
AST: 16 U/L (ref 15–41)
Albumin: 4.1 g/dL (ref 3.5–5.0)
Alkaline Phosphatase: 37 U/L — ABNORMAL LOW (ref 38–126)
Anion gap: 6 (ref 5–15)
BUN: 19 mg/dL (ref 8–23)
CO2: 30 mmol/L (ref 22–32)
Calcium: 10 mg/dL (ref 8.9–10.3)
Chloride: 102 mmol/L (ref 98–111)
Creatinine: 1.23 mg/dL (ref 0.61–1.24)
GFR, Estimated: 58 mL/min — ABNORMAL LOW (ref >60)
Glucose, Bld: 96 mg/dL (ref 70–99)
Potassium: 3.7 mmol/L (ref 3.5–5.1)
Sodium: 138 mmol/L (ref 135–145)
Total Bilirubin: 0.6 mg/dL (ref 0.3–1.2)
Total Protein: 6 g/dL — ABNORMAL LOW (ref 6.5–8.1)

## 2021-08-20 LAB — CBC WITH DIFFERENTIAL (CANCER CENTER ONLY)
Abs Immature Granulocytes: 0.01 10*3/uL (ref 0.00–0.07)
Basophils Absolute: 0 10*3/uL (ref 0.0–0.1)
Basophils Relative: 0 %
Eosinophils Absolute: 0.2 10*3/uL (ref 0.0–0.5)
Eosinophils Relative: 3 %
HCT: 33.5 % — ABNORMAL LOW (ref 39.0–52.0)
Hemoglobin: 11.1 g/dL — ABNORMAL LOW (ref 13.0–17.0)
Immature Granulocytes: 0 %
Lymphocytes Relative: 42 %
Lymphs Abs: 2.2 10*3/uL (ref 0.7–4.0)
MCH: 31.3 pg (ref 26.0–34.0)
MCHC: 33.1 g/dL (ref 30.0–36.0)
MCV: 94.4 fL (ref 80.0–100.0)
Monocytes Absolute: 0.6 10*3/uL (ref 0.1–1.0)
Monocytes Relative: 11 %
Neutro Abs: 2.2 10*3/uL (ref 1.7–7.7)
Neutrophils Relative %: 44 %
Platelet Count: 180 10*3/uL (ref 150–400)
RBC: 3.55 MIL/uL — ABNORMAL LOW (ref 4.22–5.81)
RDW: 13.2 % (ref 11.5–15.5)
WBC Count: 5.1 10*3/uL (ref 4.0–10.5)
nRBC: 0 % (ref 0.0–0.2)

## 2021-08-20 LAB — LACTATE DEHYDROGENASE: LDH: 185 U/L (ref 98–192)

## 2021-08-20 MED ORDER — DEXAMETHASONE 4 MG PO TABS
20.0000 mg | ORAL_TABLET | Freq: Once | ORAL | Status: AC
  Administered 2021-08-20: 20 mg via ORAL
  Filled 2021-08-20: qty 5

## 2021-08-20 MED ORDER — DIPHENHYDRAMINE HCL 25 MG PO CAPS
50.0000 mg | ORAL_CAPSULE | Freq: Once | ORAL | Status: AC
  Administered 2021-08-20: 50 mg via ORAL
  Filled 2021-08-20: qty 2

## 2021-08-20 MED ORDER — SODIUM CHLORIDE 0.9% FLUSH
10.0000 mL | INTRAVENOUS | Status: DC | PRN
  Administered 2021-08-20: 10 mL

## 2021-08-20 MED ORDER — DARATUMUMAB-HYALURONIDASE-FIHJ 1800-30000 MG-UT/15ML ~~LOC~~ SOLN
1800.0000 mg | Freq: Once | SUBCUTANEOUS | Status: AC
  Administered 2021-08-20: 1800 mg via SUBCUTANEOUS
  Filled 2021-08-20: qty 15

## 2021-08-20 MED ORDER — HEPARIN SOD (PORK) LOCK FLUSH 100 UNIT/ML IV SOLN
500.0000 [IU] | Freq: Once | INTRAVENOUS | Status: AC | PRN
  Administered 2021-08-20: 500 [IU]

## 2021-08-20 MED ORDER — ACETAMINOPHEN 325 MG PO TABS
650.0000 mg | ORAL_TABLET | Freq: Once | ORAL | Status: AC
  Administered 2021-08-20: 650 mg via ORAL
  Filled 2021-08-20: qty 2

## 2021-08-20 NOTE — Progress Notes (Signed)
?Hematology and Oncology Follow Up Visit ? ?Robert [name] ?[id number] ?Aug 23, 1933 88 y.o. ?08/20/2021 ? ? 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  ?Current Therapy:        ?Daratumumab -- start on 11/16/2018 -- s/p cycle 28 ?Zometa 4 mg IV q 4 months - next dose on 08/2021  ?Aranesp 300 mcg subcu q 3 weeks for hemoglobin less than 10 ?  ?Interim History:  Robert [name] is here today for follow-up and treatment. He is doing quite well.  ?No M-spike observed last month, IgG level was 534 mg/dL and  1.45 mg/dL.  ?He notes some arthritis pain behind and right below the left knee. He states that alcohol rubs and Aspercreme have helped.  ?No fever, chills, n/v, cough, rash, dizziness, SOB, chest pain, palpitations, abdominal pain or changes in bowel or bladder habits.  ?No blood loss noted. No bruising or petechiae.  ?No swelling, numbness or tingling in his extremities.    ?He has occasional tingling in the pointer and middle fingers of his right hand.  ?No falls or syncope to report.  ?He is eating well and doing his best to stay well hydrated. His weight is stable at 220 lbs.  ? ?ECOG Performance Status: 1 - Symptomatic but completely ambulatory ? ?Medications:  ?Allergies as of 08/20/2021   ?No Known Allergies ?  ? ?  ?Medication List  ?  ? ?  ? Accurate as of August 20, 2021  9:05 AM. If you have any questions, ask your nurse or doctor.  ?  ?  ? ?  ? ?aspirin 81 MG tablet ?Take 81 mg by mouth daily. ?  ?bimatoprost 0.01 % Soln ?Commonly known as: LUMIGAN ?Place 1 drop into both eyes at bedtime. ?  ?CALTRATE 600+D PLUS PO ?Take 1 tablet by mouth daily. ?  ?ciclopirox 0.77 % cream ?Commonly known as: LOPROX ?Apply 1 application topically 2 (two) times daily. As  needed ?  ?cloNIDine 0.1 MG tablet ?Commonly known as: CATAPRES ?Take 0.1 mg by mouth 2 (two) times daily as needed (Elevated BP). ?  ?cyanocobalamin 2000 MCG tablet ?Take 2,000 mcg by mouth at bedtime. ?  ?famciclovir 250 MG tablet ?Commonly known as: FAMVIR ?TAKE 1 TABLET DAILY ?  ?furosemide 20 MG tablet ?Commonly known as: LASIX ?Take 2 tablets (40 mg total) by mouth daily. ?  ?losartan 100 MG tablet ?Commonly known as: COZAAR ?Take 100 mg by mouth daily. ?  ?metolazone 5 MG tablet ?Commonly known as: ZAROXOLYN ?TAKE 1 TABLET 1 HOUR PRIOR TO TAKING LASIX DAILY ?  ?montelukast 10 MG tablet ?Commonly known as: Singulair ?Take 1 tablet (10 mg total) by mouth at bedtime. ?  ?multivitamin with minerals Tabs tablet ?Take 1 tablet by mouth daily. Unknown strenght ?  ?potassium chloride SA 20 MEQ tablet ?Commonly known as: KLOR-CON M ?Take 20 mEq by mouth daily. ?  ?rosuvastatin 20 MG tablet ?Commonly known as: CRESTOR ?Take 20 mg by mouth at bedtime. ?  ?SYSTANE OP ?Place 1 drop into both eyes daily as needed (dry eyes). ?  ?Vitamin D3 25 MCG (1000 UT) Caps ?Take 1,000 Units by mouth daily. ?  ? ?  ? ? ?Allergies: No Known Allergies ? ?Past Medical History,  Surgical history, Social history, and Family History were reviewed and updated. ? ?Review of Systems: ?All other 10 point review of systems is negative.  ? ?Physical Exam: ? weight is 220 lb 1.3 oz (99.8 kg). His oral temperature is 97.9 ?F (36.6 ?C). His blood pressure is 146/87 (abnormal) and his pulse is 64. His respiration is 18 and oxygen saturation is 100%.  ? ?Wt Readings from Last 3 Encounters:  ?08/20/21 220 lb 1.3 oz (99.8 kg)  ?07/23/21 217 lb (98.4 kg)  ?06/25/21 216 lb (98 kg)  ? ? ?Ocular: Sclerae unicteric, pupils equal, round and reactive to light ?Ear-nose-throat: Oropharynx clear, dentition fair ?Lymphatic: No cervical or supraclavicular adenopathy ?Lungs no rales or rhonchi, good excursion bilaterally ?Heart regular rate and rhythm, no murmur  appreciated ?Abd soft, nontender, positive bowel sounds ?MSK no focal spinal tenderness, no joint edema ?Neuro: non-focal, well-oriented, appropriate affect ?Breasts: Deferred  ? ?Lab Results  ?Component Value Date  ? WBC 5.1 08/20/2021  ? HGB 11.1 (L) 08/20/2021  ? HCT 33.5 (L) 08/20/2021  ? MCV 94.4 08/20/2021  ? PLT 180 08/20/2021  ? ?Lab Results  ?Component Value Date  ? FERRITIN 555 (H) 07/23/2021  ? IRON 84 07/23/2021  ? TIBC 325 07/23/2021  ? UIBC 241 07/23/2021  ? IRONPCTSAT 26 07/23/2021  ? ?Lab Results  ?Component Value Date  ? RETICCTPCT 2.2 04/05/2019  ? RBC 3.55 (L) 08/20/2021  ? ?Lab Results  ?Component Value Date  ? KPAFRELGTCHN 14.5 07/23/2021  ? LAMBDASER 6.8 07/23/2021  ? KAPLAMBRATIO 2.13 (H) 07/23/2021  ? ?Lab Results  ?Component Value Date  ? IGGSERUM 534 (L) 07/23/2021  ? IGA 22 (L) 07/23/2021  ? IGMSERUM 39 07/23/2021  ? ?Lab Results  ?Component Value Date  ? TOTALPROTELP 5.9 (L) 07/23/2021  ? ALBUMINELP 3.6 07/23/2021  ? A1GS 0.2 07/23/2021  ? A2GS 0.9 07/23/2021  ? BETS 0.8 07/23/2021  ? BETA2SER 0.3 01/03/2015  ? GAMS 0.5 07/23/2021  ? MSPIKE Not Observed 07/23/2021  ? SPEI Comment 07/23/2021  ? ?  Chemistry   ?   ?Component Value Date/Time  ? NA 139 07/23/2021 0929  ? NA 137 04/08/2017 1141  ? NA 138 10/29/2015 1029  ? K 3.5 07/23/2021 0929  ? K 3.4 04/08/2017 1141  ? K 3.9 10/29/2015 1029  ? CL 102 07/23/2021 0929  ? CL 102 04/08/2017 1141  ? CO2 30 07/23/2021 0929  ? CO2 30 04/08/2017 1141  ? CO2 27 10/29/2015 1029  ? BUN 27 (H) 07/23/2021 0929  ? BUN 16 04/08/2017 1141  ? BUN 13.8 10/29/2015 1029  ? CREATININE 1.22 07/23/2021 0929  ? CREATININE 1.3 (H) 04/08/2017 1141  ? CREATININE 1.1 10/29/2015 1029  ?    ?Component Value Date/Time  ? CALCIUM 9.8 07/23/2021 0929  ? CALCIUM 9.3 04/08/2017 1141  ? CALCIUM 9.3 10/29/2015 1029  ? ALKPHOS 43 07/23/2021 0929  ? ALKPHOS 66 04/08/2017 1141  ? ALKPHOS 52 10/29/2015 1029  ? AST 15 07/23/2021 0929  ? AST 20 10/29/2015 1029  ? ALT 11 07/23/2021  0929  ? ALT 21 04/08/2017 1141  ? ALT 14 10/29/2015 1029  ? BILITOT 0.6 07/23/2021 0929  ? BILITOT 0.75 10/29/2015 1029  ?  ? ? ? ?Impression and Plan: Robert [name] is a very pleasant 88 yo African American gentleman with relapsed IgG kappa myeloma. He has history of stem cell transplant in 2006 and since relapsed.   ?He continues to do well and will proceed with treatment  today as planned.  ?Protein studies pending.  ?Follow-up in 1 month.  ? ?Lottie Dawson, NP ?3/29/20239:05 AM ? ?

## 2021-08-20 NOTE — Patient Instructions (Signed)
[facility]  Discharge Instructions: ?Thank you for choosing [facility] to provide your oncology and hematology care.  ? ?If you have a lab appointment with the [facility], please go directly to the [facility] and check in at the registration area. ? ?Wear comfortable clothing and clothing appropriate for easy access to any Portacath or PICC line.  ? ?We strive to give you quality time with your provider. You may need to reschedule your appointment if you arrive late (15 or more minutes).  Arriving late affects you and other patients whose appointments are after yours.  Also, if you miss three or more appointments without notifying the office, you may be dismissed from the clinic at the provider?s discretion.    ?  ?For prescription refill requests, have your pharmacy contact our office and allow 72 hours for refills to be completed.   ? ?Today you received the following chemotherapy and/or immunotherapy agents Darzalex    ?  ?To help prevent nausea and vomiting after your treatment, we encourage you to take your nausea medication as directed. ? ?BELOW ARE SYMPTOMS THAT SHOULD BE REPORTED IMMEDIATELY: ?*FEVER GREATER THAN 100.4 F (38 ?C) OR HIGHER ?*CHILLS OR SWEATING ?*NAUSEA AND VOMITING THAT IS NOT CONTROLLED WITH YOUR NAUSEA MEDICATION ?*UNUSUAL SHORTNESS OF BREATH ?*UNUSUAL BRUISING OR BLEEDING ?*URINARY PROBLEMS (pain or burning when urinating, or frequent urination) ?*BOWEL PROBLEMS (unusual diarrhea, constipation, pain near the anus) ?TENDERNESS IN MOUTH AND THROAT WITH OR WITHOUT PRESENCE OF ULCERS (sore throat, sores in mouth, or a toothache) ?UNUSUAL RASH, SWELLING OR PAIN  ?UNUSUAL VAGINAL DISCHARGE OR ITCHING  ? ?Items with * indicate a potential emergency and should be followed up as soon as possible or go to the Emergency Department if any problems should occur. ? ?Please show the CHEMOTHERAPY ALERT CARD or IMMUNOTHERAPY ALERT CARD at check-in to the  Emergency Department and triage nurse. ?Should you have questions after your visit or need to cancel or reschedule your appointment, please contact [facility]  [phone] and follow the prompts.  Office hours are 8:00 a.m. to 4:30 p.m. Monday - Friday. Please note that voicemails left after 4:00 p.m. may not be returned until the following business day.  We are closed weekends and major holidays. You have access to a nurse at all times for urgent questions. Please call the main number to the clinic [phone] and follow the prompts. ? ?For any non-urgent questions, you may also contact your provider using MyChart. We now offer e-Visits for anyone 58 and older to request care online for non-urgent symptoms. For details visit [name]. ?  ?Also download the MyChart app! Go to the app store, search "MyChart", open the app, select [facility], and log in with your MyChart username and password. ? ?Due to Covid, a mask is required upon entering the hospital/clinic. If you do not have a mask, one will be given to you upon arrival. For doctor visits, patients may have 1 support person aged 105 or older with them. For treatment visits, patients cannot have anyone with them due to current Covid guidelines and our immunocompromised population.  ?

## 2021-08-21 LAB — IGG, IGA, IGM
IgA: 20 mg/dL — ABNORMAL LOW (ref 61–437)
IgG (Immunoglobin G), Serum: 531 mg/dL — ABNORMAL LOW (ref 603–1613)
IgM (Immunoglobulin M), Srm: 38 mg/dL (ref 15–143)

## 2021-08-21 LAB — KAPPA/LAMBDA LIGHT CHAINS
Kappa free light chain: 11.7 mg/L (ref 3.3–19.4)
Kappa, lambda light chain ratio: 2.6 — ABNORMAL HIGH (ref 0.26–1.65)
Lambda free light chains: 4.5 mg/L — ABNORMAL LOW (ref 5.7–26.3)

## 2021-08-22 LAB — PROTEIN ELECTROPHORESIS, SERUM
A/G Ratio: 1.8 — ABNORMAL HIGH (ref 0.7–1.7)
Albumin ELP: 3.5 g/dL (ref 2.9–4.4)
Alpha-1-Globulin: 0.1 g/dL (ref 0.0–0.4)
Alpha-2-Globulin: 0.6 g/dL (ref 0.4–1.0)
Beta Globulin: 0.8 g/dL (ref 0.7–1.3)
Gamma Globulin: 0.4 g/dL (ref 0.4–1.8)
Globulin, Total: 2 g/dL — ABNORMAL LOW (ref 2.2–3.9)
Total Protein ELP: 5.5 g/dL — ABNORMAL LOW (ref 6.0–8.5)

## 2021-08-25 ENCOUNTER — Telehealth: Payer: Self-pay | Admitting: *Deleted

## 2021-08-25 NOTE — Telephone Encounter (Signed)
Per 08/25/21 los - called and lvm of upcoming appointments - requested call back to confirm ?

## 2021-08-27 ENCOUNTER — Other Ambulatory Visit: Payer: Self-pay | Admitting: Family

## 2021-08-27 DIAGNOSIS — C9000 Multiple myeloma not having achieved remission: Secondary | ICD-10-CM

## 2021-08-27 DIAGNOSIS — M899 Disorder of bone, unspecified: Secondary | ICD-10-CM

## 2021-09-17 ENCOUNTER — Encounter: Payer: Self-pay | Admitting: Family

## 2021-09-17 ENCOUNTER — Inpatient Hospital Stay: Payer: Medicare Other | Attending: Hematology & Oncology

## 2021-09-17 ENCOUNTER — Inpatient Hospital Stay (HOSPITAL_BASED_OUTPATIENT_CLINIC_OR_DEPARTMENT_OTHER): Payer: Medicare Other | Admitting: Family

## 2021-09-17 ENCOUNTER — Inpatient Hospital Stay: Payer: Medicare Other

## 2021-09-17 VITALS — BP 138/85 | HR 63 | Temp 97.6°F | Resp 17 | Wt 218.0 lb

## 2021-09-17 DIAGNOSIS — D631 Anemia in chronic kidney disease: Secondary | ICD-10-CM

## 2021-09-17 DIAGNOSIS — Z5112 Encounter for antineoplastic immunotherapy: Secondary | ICD-10-CM | POA: Insufficient documentation

## 2021-09-17 DIAGNOSIS — C9000 Multiple myeloma not having achieved remission: Secondary | ICD-10-CM

## 2021-09-17 DIAGNOSIS — Z79899 Other long term (current) drug therapy: Secondary | ICD-10-CM | POA: Insufficient documentation

## 2021-09-17 DIAGNOSIS — N183 Chronic kidney disease, stage 3 unspecified: Secondary | ICD-10-CM

## 2021-09-17 DIAGNOSIS — M899 Disorder of bone, unspecified: Secondary | ICD-10-CM

## 2021-09-17 DIAGNOSIS — C9002 Multiple myeloma in relapse: Secondary | ICD-10-CM | POA: Insufficient documentation

## 2021-09-17 LAB — CMP (CANCER CENTER ONLY)
ALT: 12 U/L (ref 0–44)
AST: 15 U/L (ref 15–41)
Albumin: 4 g/dL (ref 3.5–5.0)
Alkaline Phosphatase: 37 U/L — ABNORMAL LOW (ref 38–126)
Anion gap: 7 (ref 5–15)
BUN: 17 mg/dL (ref 8–23)
CO2: 30 mmol/L (ref 22–32)
Calcium: 10 mg/dL (ref 8.9–10.3)
Chloride: 101 mmol/L (ref 98–111)
Creatinine: 1.25 mg/dL — ABNORMAL HIGH (ref 0.61–1.24)
GFR, Estimated: 56 mL/min — ABNORMAL LOW (ref >60)
Glucose, Bld: 99 mg/dL (ref 70–99)
Potassium: 3.7 mmol/L (ref 3.5–5.1)
Sodium: 138 mmol/L (ref 135–145)
Total Bilirubin: 0.8 mg/dL (ref 0.3–1.2)
Total Protein: 6.2 g/dL — ABNORMAL LOW (ref 6.5–8.1)

## 2021-09-17 LAB — CBC WITH DIFFERENTIAL (CANCER CENTER ONLY)
Abs Immature Granulocytes: 0.01 10*3/uL (ref 0.00–0.07)
Basophils Absolute: 0 10*3/uL (ref 0.0–0.1)
Basophils Relative: 0 %
Eosinophils Absolute: 0.1 10*3/uL (ref 0.0–0.5)
Eosinophils Relative: 2 %
HCT: 35.1 % — ABNORMAL LOW (ref 39.0–52.0)
Hemoglobin: 11.6 g/dL — ABNORMAL LOW (ref 13.0–17.0)
Immature Granulocytes: 0 %
Lymphocytes Relative: 31 %
Lymphs Abs: 2 10*3/uL (ref 0.7–4.0)
MCH: 31.3 pg (ref 26.0–34.0)
MCHC: 33 g/dL (ref 30.0–36.0)
MCV: 94.6 fL (ref 80.0–100.0)
Monocytes Absolute: 0.6 10*3/uL (ref 0.1–1.0)
Monocytes Relative: 9 %
Neutro Abs: 3.6 10*3/uL (ref 1.7–7.7)
Neutrophils Relative %: 58 %
Platelet Count: 179 10*3/uL (ref 150–400)
RBC: 3.71 MIL/uL — ABNORMAL LOW (ref 4.22–5.81)
RDW: 13.5 % (ref 11.5–15.5)
WBC Count: 6.3 10*3/uL (ref 4.0–10.5)
nRBC: 0 % (ref 0.0–0.2)

## 2021-09-17 LAB — LACTATE DEHYDROGENASE: LDH: 178 U/L (ref 98–192)

## 2021-09-17 MED ORDER — HEPARIN SOD (PORK) LOCK FLUSH 100 UNIT/ML IV SOLN
500.0000 [IU] | Freq: Once | INTRAVENOUS | Status: AC | PRN
  Administered 2021-09-17: 500 [IU]

## 2021-09-17 MED ORDER — SODIUM CHLORIDE 0.9% FLUSH
10.0000 mL | INTRAVENOUS | Status: DC | PRN
  Administered 2021-09-17: 10 mL

## 2021-09-17 MED ORDER — ZOLEDRONIC ACID 4 MG/100ML IV SOLN
4.0000 mg | Freq: Once | INTRAVENOUS | Status: AC
  Administered 2021-09-17: 4 mg via INTRAVENOUS
  Filled 2021-09-17: qty 100

## 2021-09-17 MED ORDER — ACETAMINOPHEN 325 MG PO TABS
ORAL_TABLET | ORAL | Status: AC
  Filled 2021-09-17: qty 1

## 2021-09-17 MED ORDER — ACETAMINOPHEN 325 MG PO TABS
650.0000 mg | ORAL_TABLET | Freq: Once | ORAL | Status: AC
  Administered 2021-09-17: 650 mg via ORAL
  Filled 2021-09-17: qty 2

## 2021-09-17 MED ORDER — SODIUM CHLORIDE 0.9 % IV SOLN: Freq: Once | INTRAVENOUS | Status: AC

## 2021-09-17 MED ORDER — DEXAMETHASONE 4 MG PO TABS
20.0000 mg | ORAL_TABLET | Freq: Once | ORAL | Status: AC
  Administered 2021-09-17: 20 mg via ORAL
  Filled 2021-09-17: qty 5

## 2021-09-17 MED ORDER — DIPHENHYDRAMINE HCL 25 MG PO CAPS
50.0000 mg | ORAL_CAPSULE | Freq: Once | ORAL | Status: AC
  Administered 2021-09-17: 50 mg via ORAL
  Filled 2021-09-17: qty 2

## 2021-09-17 MED ORDER — DARATUMUMAB-HYALURONIDASE-FIHJ 1800-30000 MG-UT/15ML ~~LOC~~ SOLN
1800.0000 mg | Freq: Once | SUBCUTANEOUS | Status: AC
  Administered 2021-09-17: 1800 mg via SUBCUTANEOUS
  Filled 2021-09-17: qty 15

## 2021-09-17 NOTE — Patient Instructions (Signed)
[facility]  Discharge Instructions: ?Thank you for choosing [facility] to provide your oncology and hematology care.  ? ?If you have a lab appointment with the [facility], please go directly to the [facility] and check in at the registration area. ? ?Wear comfortable clothing and clothing appropriate for easy access to any Portacath or PICC line.  ? ?We strive to give you quality time with your provider. You may need to reschedule your appointment if you arrive late (15 or more minutes).  Arriving late affects you and other patients whose appointments are after yours.  Also, if you miss three or more appointments without notifying the office, you may be dismissed from the clinic at the provider?s discretion.    ?  ?For prescription refill requests, have your pharmacy contact our office and allow 72 hours for refills to be completed.   ? ?Today you received the following chemotherapy and/or immunotherapy agents Darzales and Zometa. ?  ?To help prevent nausea and vomiting after your treatment, we encourage you to take your nausea medication as directed. ? ?BELOW ARE SYMPTOMS THAT SHOULD BE REPORTED IMMEDIATELY: ?*FEVER GREATER THAN 100.4 F (38 ?C) OR HIGHER ?*CHILLS OR SWEATING ?*NAUSEA AND VOMITING THAT IS NOT CONTROLLED WITH YOUR NAUSEA MEDICATION ?*UNUSUAL SHORTNESS OF BREATH ?*UNUSUAL BRUISING OR BLEEDING ?*URINARY PROBLEMS (pain or burning when urinating, or frequent urination) ?*BOWEL PROBLEMS (unusual diarrhea, constipation, pain near the anus) ?TENDERNESS IN MOUTH AND THROAT WITH OR WITHOUT PRESENCE OF ULCERS (sore throat, sores in mouth, or a toothache) ?UNUSUAL RASH, SWELLING OR PAIN  ?UNUSUAL VAGINAL DISCHARGE OR ITCHING  ? ?Items with * indicate a potential emergency and should be followed up as soon as possible or go to the Emergency Department if any problems should occur. ? ?Please show the CHEMOTHERAPY ALERT CARD or IMMUNOTHERAPY ALERT CARD at check-in  to the Emergency Department and triage nurse. ?Should you have questions after your visit or need to cancel or reschedule your appointment, please contact [facility]  [phone] and follow the prompts.  Office hours are 8:00 a.m. to 4:30 p.m. Monday - Friday. Please note that voicemails left after 4:00 p.m. may not be returned until the following business day.  We are closed weekends and major holidays. You have access to a nurse at all times for urgent questions. Please call the main number to the clinic [phone] and follow the prompts. ? ?For any non-urgent questions, you may also contact your provider using MyChart. We now offer e-Visits for anyone 36 and older to request care online for non-urgent symptoms. For details visit [name]. ?  ?Also download the MyChart app! Go to the app store, search "MyChart", open the app, select , and log in with your MyChart username and password. ? ?Due to Covid, a mask is required upon entering the hospital/clinic. If you do not have a mask, one will be given to you upon arrival. For doctor visits, patients may have 1 support person aged 33 or older with them. For treatment visits, patients cannot have anyone with them due to current Covid guidelines and our immunocompromised population.  ?

## 2021-09-17 NOTE — Progress Notes (Signed)
?Hematology and Oncology Follow Up Visit ? ?Robert [name] ?[id number] ?1933-07-19 88 y.o. ?09/17/2021 ? ? 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  ?Current Therapy:        ?Daratumumab -- start on 11/16/2018 -- s/p cycle 28 ?Zometa 4 mg IV q 4 months - next dose on 08/2021  ?Aranesp 300 mcg subcu q 3 weeks for hemoglobin less than 10 ?  ?Interim History:  Robert [name] is here today for follow-up and treatment. He is doing well and has no complaints at this time.  ?He has been out working in the yard. He states that he has occasional fatigue and will take a break if needed.  ?No blood loss noted. No bruising or petechiae.  ?No M-spike noted in March, IgG level was 531 mg/dL and kappa light chains 1.17 mg/dL.  ?No fever, chills, n/v, cough, rash, dizziness, SOB, chest pain, palpitations, abdominal pain or changes in bowel or bladder habits.  ?No swelling or tenderness in his extremities.  ?No falls or syncope. He has tingling in the fingertips of his right hand that come and go.  ?He has been eating well and staying hydrated throughout the day. His weight is stable at 218 lbs.  ? ?ECOG Performance Status: 1 - Symptomatic but completely ambulatory ? ?Medications:  ?Allergies as of 09/17/2021   ?No Known Allergies ?  ? ?  ?Medication List  ?  ? ?  ? Accurate as of September 17, 2021  9:57 AM. If you have any questions, ask your nurse or doctor.  ?  ?  ? ?  ? ?aspirin 81 MG tablet ?Take 81 mg by mouth daily. ?  ?bimatoprost 0.01 % Soln ?Commonly known as: LUMIGAN ?Place 1 drop into both eyes at bedtime. ?  ?CALTRATE 600+D PLUS PO ?Take 1 tablet by mouth daily. ?  ?ciclopirox 0.77 % cream ?Commonly known as: LOPROX ?Apply 1 application topically 2 (two) times daily. As  needed ?  ?cloNIDine 0.1 MG tablet ?Commonly known as: CATAPRES ?Take 0.1 mg by mouth 2 (two) times daily as needed (Elevated BP). ?  ?cyanocobalamin 2000 MCG tablet ?Take 2,000 mcg by mouth at bedtime. ?  ?famciclovir 250 MG tablet ?Commonly known as: FAMVIR ?TAKE 1 TABLET DAILY ?  ?furosemide 20 MG tablet ?Commonly known as: LASIX ?Take 2 tablets (40 mg total) by mouth daily. ?  ?losartan 100 MG tablet ?Commonly known as: COZAAR ?Take 100 mg by mouth daily. ?  ?metolazone 5 MG tablet ?Commonly known as: ZAROXOLYN ?TAKE 1 TABLET 1 HOUR PRIOR TO TAKING LASIX DAILY ?  ?montelukast 10 MG tablet ?Commonly known as: SINGULAIR ?TAKE 1 TABLET AT BEDTIME ?  ?multivitamin with minerals Tabs tablet ?Take 1 tablet by mouth daily. Unknown strenght ?  ?potassium chloride SA 20 MEQ tablet ?Commonly known as: KLOR-CON M ?Take 20 mEq by mouth daily. ?  ?rosuvastatin 20 MG tablet ?Commonly known as: CRESTOR ?Take 20 mg by mouth at bedtime. ?  ?SYSTANE OP ?Place 1 drop into both eyes daily as needed (dry eyes). ?  ?Vitamin D3 25 MCG (1000 UT) Caps ?Take 1,000 Units by mouth daily. ?  ?Voltaren 1 % Gel ?Generic drug: diclofenac Sodium ?Apply topically 4 (four) times daily. As needed ?  ? ?  ? ? ?  Allergies: No Known Allergies ? ?Past Medical History, Surgical history, Social history, and Family History were reviewed and updated. ? ?Review of Systems: ?All other 10 point review of systems is negative.  ? ?Physical Exam: ? weight is 218 lb (98.9 kg). His oral temperature is 97.6 ?F (36.4 ?C). His blood pressure is 138/85 and his pulse is 63. His respiration is 17 and oxygen saturation is 100%.  ? ?Wt Readings from Last 3 Encounters:  ?09/17/21 218 lb (98.9 kg)  ?08/20/21 220 lb 1.3 oz (99.8 kg)  ?07/23/21 217 lb (98.4 kg)  ? ? ?Ocular: Sclerae unicteric, pupils equal, round and reactive to light ?Ear-nose-throat: Oropharynx clear, dentition fair ?Lymphatic: No cervical or supraclavicular adenopathy ?Lungs no rales or rhonchi, good  excursion bilaterally ?Heart regular rate and rhythm, no murmur appreciated ?Abd soft, nontender, positive bowel sounds ?MSK no focal spinal tenderness, no joint edema ?Neuro: non-focal, well-oriented, appropriate affect ?Breasts: Deferred  ? ?Lab Results  ?Component Value Date  ? WBC 6.3 09/17/2021  ? HGB 11.6 (L) 09/17/2021  ? HCT 35.1 (L) 09/17/2021  ? MCV 94.6 09/17/2021  ? PLT 179 09/17/2021  ? ?Lab Results  ?Component Value Date  ? FERRITIN 555 (H) 07/23/2021  ? IRON 84 07/23/2021  ? TIBC 325 07/23/2021  ? UIBC 241 07/23/2021  ? IRONPCTSAT 26 07/23/2021  ? ?Lab Results  ?Component Value Date  ? RETICCTPCT 2.2 04/05/2019  ? RBC 3.71 (L) 09/17/2021  ? ?Lab Results  ?Component Value Date  ? KPAFRELGTCHN 11.7 08/20/2021  ? LAMBDASER 4.5 (L) 08/20/2021  ? KAPLAMBRATIO 2.60 (H) 08/20/2021  ? ?Lab Results  ?Component Value Date  ? IGGSERUM 531 (L) 08/20/2021  ? IGA 20 (L) 08/20/2021  ? IGMSERUM 38 08/20/2021  ? ?Lab Results  ?Component Value Date  ? TOTALPROTELP 5.5 (L) 08/20/2021  ? ALBUMINELP 3.5 08/20/2021  ? A1GS 0.1 08/20/2021  ? A2GS 0.6 08/20/2021  ? BETS 0.8 08/20/2021  ? BETA2SER 0.3 01/03/2015  ? GAMS 0.4 08/20/2021  ? MSPIKE Not Observed 08/20/2021  ? SPEI Comment 08/20/2021  ? ?  Chemistry   ?   ?Component Value Date/Time  ? NA 138 08/20/2021 0843  ? NA 137 04/08/2017 1141  ? NA 138 10/29/2015 1029  ? K 3.7 08/20/2021 0843  ? K 3.4 04/08/2017 1141  ? K 3.9 10/29/2015 1029  ? CL 102 08/20/2021 0843  ? CL 102 04/08/2017 1141  ? CO2 30 08/20/2021 0843  ? CO2 30 04/08/2017 1141  ? CO2 27 10/29/2015 1029  ? BUN 19 08/20/2021 0843  ? BUN 16 04/08/2017 1141  ? BUN 13.8 10/29/2015 1029  ? CREATININE 1.23 08/20/2021 0843  ? CREATININE 1.3 (H) 04/08/2017 1141  ? CREATININE 1.1 10/29/2015 1029  ?    ?Component Value Date/Time  ? CALCIUM 10.0 08/20/2021 0843  ? CALCIUM 9.3 04/08/2017 1141  ? CALCIUM 9.3 10/29/2015 1029  ? ALKPHOS 37 (L) 08/20/2021 0843  ? ALKPHOS 66 04/08/2017 1141  ? ALKPHOS 52 10/29/2015 1029  ?  AST 16 08/20/2021 0843  ? AST 20 10/29/2015 1029  ? ALT 12 08/20/2021 0843  ? ALT 21 04/08/2017 1141  ? ALT 14 10/29/2015 1029  ? BILITOT 0.6 08/20/2021 0843  ? BILITOT 0.75 10/29/2015 1029  ?  ? ? ? ?Impression and Plan: Robert [name] is a very pleasant 88 yo African American gentleman with relapsed IgG kappa myeloma. He has history of stem cell transplant in 2006 and since relapsed.   ?We will proceed with  treatment today.  ?No ESA needed, Hgb 11.6.  ?Protein studies pending.  ?Follow-up in 1 month.  ? ?Lottie Dawson, NP ?4/26/20239:57 AM ? ?

## 2021-09-17 NOTE — Patient Instructions (Signed)

## 2021-09-18 LAB — KAPPA/LAMBDA LIGHT CHAINS
Kappa free light chain: 10.7 mg/L (ref 3.3–19.4)
Kappa, lambda light chain ratio: 2.49 — ABNORMAL HIGH (ref 0.26–1.65)
Lambda free light chains: 4.3 mg/L — ABNORMAL LOW (ref 5.7–26.3)

## 2021-09-18 LAB — IGG, IGA, IGM
IgA: 22 mg/dL — ABNORMAL LOW (ref 61–437)
IgG (Immunoglobin G), Serum: 496 mg/dL — ABNORMAL LOW (ref 603–1613)
IgM (Immunoglobulin M), Srm: 35 mg/dL (ref 15–143)

## 2021-09-19 LAB — PROTEIN ELECTROPHORESIS, SERUM
A/G Ratio: 1.7 (ref 0.7–1.7)
Albumin ELP: 3.8 g/dL (ref 2.9–4.4)
Alpha-1-Globulin: 0.2 g/dL (ref 0.0–0.4)
Alpha-2-Globulin: 0.7 g/dL (ref 0.4–1.0)
Beta Globulin: 0.9 g/dL (ref 0.7–1.3)
Gamma Globulin: 0.5 g/dL (ref 0.4–1.8)
Globulin, Total: 2.2 g/dL (ref 2.2–3.9)
Total Protein ELP: 6 g/dL (ref 6.0–8.5)

## 2021-09-25 ENCOUNTER — Telehealth: Payer: Self-pay | Admitting: *Deleted

## 2021-09-25 NOTE — Telephone Encounter (Signed)
Per 09/17/21 los - called and lvm of  upcoming appointments - request call back to confirm ?

## 2021-10-17 ENCOUNTER — Encounter: Payer: Self-pay | Admitting: Family

## 2021-10-17 ENCOUNTER — Inpatient Hospital Stay: Payer: Medicare Other | Attending: Hematology & Oncology

## 2021-10-17 ENCOUNTER — Inpatient Hospital Stay: Payer: Medicare Other

## 2021-10-17 ENCOUNTER — Inpatient Hospital Stay (HOSPITAL_BASED_OUTPATIENT_CLINIC_OR_DEPARTMENT_OTHER): Payer: Medicare Other | Admitting: Family

## 2021-10-17 ENCOUNTER — Other Ambulatory Visit: Payer: Self-pay

## 2021-10-17 VITALS — BP 184/84 | HR 73 | Temp 98.1°F | Resp 18 | Ht 71.0 in | Wt 222.1 lb

## 2021-10-17 VITALS — BP 163/77 | HR 56

## 2021-10-17 DIAGNOSIS — D631 Anemia in chronic kidney disease: Secondary | ICD-10-CM

## 2021-10-17 DIAGNOSIS — M899 Disorder of bone, unspecified: Secondary | ICD-10-CM | POA: Diagnosis not present

## 2021-10-17 DIAGNOSIS — C9000 Multiple myeloma not having achieved remission: Secondary | ICD-10-CM | POA: Diagnosis not present

## 2021-10-17 DIAGNOSIS — Z9484 Stem cells transplant status: Secondary | ICD-10-CM | POA: Insufficient documentation

## 2021-10-17 DIAGNOSIS — D630 Anemia in neoplastic disease: Secondary | ICD-10-CM | POA: Insufficient documentation

## 2021-10-17 DIAGNOSIS — N183 Chronic kidney disease, stage 3 unspecified: Secondary | ICD-10-CM

## 2021-10-17 DIAGNOSIS — Z95828 Presence of other vascular implants and grafts: Secondary | ICD-10-CM

## 2021-10-17 DIAGNOSIS — C9002 Multiple myeloma in relapse: Secondary | ICD-10-CM | POA: Insufficient documentation

## 2021-10-17 DIAGNOSIS — Z5112 Encounter for antineoplastic immunotherapy: Secondary | ICD-10-CM | POA: Insufficient documentation

## 2021-10-17 LAB — CMP (CANCER CENTER ONLY)
ALT: 11 U/L (ref 0–44)
AST: 15 U/L (ref 15–41)
Albumin: 4 g/dL (ref 3.5–5.0)
Alkaline Phosphatase: 41 U/L (ref 38–126)
Anion gap: 6 (ref 5–15)
BUN: 21 mg/dL (ref 8–23)
CO2: 29 mmol/L (ref 22–32)
Calcium: 9.6 mg/dL (ref 8.9–10.3)
Chloride: 101 mmol/L (ref 98–111)
Creatinine: 1.33 mg/dL — ABNORMAL HIGH (ref 0.61–1.24)
GFR, Estimated: 52 mL/min — ABNORMAL LOW (ref >60)
Glucose, Bld: 105 mg/dL — ABNORMAL HIGH (ref 70–99)
Potassium: 3.7 mmol/L (ref 3.5–5.1)
Sodium: 136 mmol/L (ref 135–145)
Total Bilirubin: 0.7 mg/dL (ref 0.3–1.2)
Total Protein: 5.9 g/dL — ABNORMAL LOW (ref 6.5–8.1)

## 2021-10-17 LAB — CBC WITH DIFFERENTIAL (CANCER CENTER ONLY)
Abs Immature Granulocytes: 0.02 10*3/uL (ref 0.00–0.07)
Basophils Absolute: 0 10*3/uL (ref 0.0–0.1)
Basophils Relative: 0 %
Eosinophils Absolute: 0.1 10*3/uL (ref 0.0–0.5)
Eosinophils Relative: 2 %
HCT: 32.8 % — ABNORMAL LOW (ref 39.0–52.0)
Hemoglobin: 11.1 g/dL — ABNORMAL LOW (ref 13.0–17.0)
Immature Granulocytes: 0 %
Lymphocytes Relative: 37 %
Lymphs Abs: 2.1 10*3/uL (ref 0.7–4.0)
MCH: 31.9 pg (ref 26.0–34.0)
MCHC: 33.8 g/dL (ref 30.0–36.0)
MCV: 94.3 fL (ref 80.0–100.0)
Monocytes Absolute: 0.6 10*3/uL (ref 0.1–1.0)
Monocytes Relative: 11 %
Neutro Abs: 2.8 10*3/uL (ref 1.7–7.7)
Neutrophils Relative %: 50 %
Platelet Count: 163 10*3/uL (ref 150–400)
RBC: 3.48 MIL/uL — ABNORMAL LOW (ref 4.22–5.81)
RDW: 13.4 % (ref 11.5–15.5)
WBC Count: 5.7 10*3/uL (ref 4.0–10.5)
nRBC: 0 % (ref 0.0–0.2)

## 2021-10-17 LAB — LACTATE DEHYDROGENASE: LDH: 191 U/L (ref 98–192)

## 2021-10-17 MED ORDER — SODIUM CHLORIDE 0.9% FLUSH
10.0000 mL | Freq: Once | INTRAVENOUS | Status: AC
  Administered 2021-10-17: 10 mL via INTRAVENOUS

## 2021-10-17 MED ORDER — HEPARIN SOD (PORK) LOCK FLUSH 100 UNIT/ML IV SOLN
500.0000 [IU] | Freq: Once | INTRAVENOUS | Status: AC
  Administered 2021-10-17: 500 [IU] via INTRAVENOUS

## 2021-10-17 MED ORDER — DARATUMUMAB-HYALURONIDASE-FIHJ 1800-30000 MG-UT/15ML ~~LOC~~ SOLN
1800.0000 mg | Freq: Once | SUBCUTANEOUS | Status: AC
  Administered 2021-10-17: 1800 mg via SUBCUTANEOUS
  Filled 2021-10-17: qty 15

## 2021-10-17 MED ORDER — ACETAMINOPHEN 325 MG PO TABS
650.0000 mg | ORAL_TABLET | Freq: Once | ORAL | Status: AC
  Administered 2021-10-17: 650 mg via ORAL
  Filled 2021-10-17: qty 2

## 2021-10-17 MED ORDER — SODIUM CHLORIDE 0.9 % IV SOLN: Freq: Once | INTRAVENOUS | Status: DC

## 2021-10-17 MED ORDER — DIPHENHYDRAMINE HCL 25 MG PO CAPS
50.0000 mg | ORAL_CAPSULE | Freq: Once | ORAL | Status: AC
  Administered 2021-10-17: 50 mg via ORAL
  Filled 2021-10-17: qty 2

## 2021-10-17 MED ORDER — DEXAMETHASONE 4 MG PO TABS
20.0000 mg | ORAL_TABLET | Freq: Once | ORAL | Status: AC
  Administered 2021-10-17: 20 mg via ORAL
  Filled 2021-10-17: qty 5

## 2021-10-17 NOTE — Progress Notes (Signed)
Patient declined to wait 1 hour post Darzalex shot. Released stable and ASX.

## 2021-10-17 NOTE — Patient Instructions (Signed)
[facility]  Discharge Instructions: Thank you for choosing [facility] to provide your oncology and hematology care.   If you have a lab appointment with the [facility], please go directly to the [facility] and check in at the registration area.  Wear comfortable clothing and clothing appropriate for easy access to any Portacath or PICC line.   We strive to give you quality time with your provider. You may need to reschedule your appointment if you arrive late (15 or more minutes).  Arriving late affects you and other patients whose appointments are after yours.  Also, if you miss three or more appointments without notifying the office, you may be dismissed from the clinic at the provider's discretion.      For prescription refill requests, have your pharmacy contact our office and allow 72 hours for refills to be completed.    Today you received the following chemotherapy and/or immunotherapy agents Darzalex.      To help prevent nausea and vomiting after your treatment, we encourage you to take your nausea medication as directed.  BELOW ARE SYMPTOMS THAT SHOULD BE REPORTED IMMEDIATELY: *FEVER GREATER THAN 100.4 F (38 C) OR HIGHER *CHILLS OR SWEATING *NAUSEA AND VOMITING THAT IS NOT CONTROLLED WITH YOUR NAUSEA MEDICATION *UNUSUAL SHORTNESS OF BREATH *UNUSUAL BRUISING OR BLEEDING *URINARY PROBLEMS (pain or burning when urinating, or frequent urination) *BOWEL PROBLEMS (unusual diarrhea, constipation, pain near the anus) TENDERNESS IN MOUTH AND THROAT WITH OR WITHOUT PRESENCE OF ULCERS (sore throat, sores in mouth, or a toothache) UNUSUAL RASH, SWELLING OR PAIN  UNUSUAL VAGINAL DISCHARGE OR ITCHING   Items with * indicate a potential emergency and should be followed up as soon as possible or go to the Emergency Department if any problems should occur.  Please show the CHEMOTHERAPY ALERT CARD or IMMUNOTHERAPY ALERT CARD at check-in to the  Emergency Department and triage nurse. Should you have questions after your visit or need to cancel or reschedule your appointment, please contact [facility]  [phone] and follow the prompts.  Office hours are 8:00 a.m. to 4:30 p.m. Monday - Friday. Please note that voicemails left after 4:00 p.m. may not be returned until the following business day.  We are closed weekends and major holidays. You have access to a nurse at all times for urgent questions. Please call the main number to the clinic [phone] and follow the prompts.  For any non-urgent questions, you may also contact your provider using MyChart. We now offer e-Visits for anyone 38 and older to request care online for non-urgent symptoms. For details visit [name].   Also download the MyChart app! Go to the app store, search "MyChart", open the app, select [facility], and log in with your MyChart username and password.  Due to Covid, a mask is required upon entering the hospital/clinic. If you do not have a mask, one will be given to you upon arrival. For doctor visits, patients may have 1 support person aged 62 or older with them. For treatment visits, patients cannot have anyone with them due to current Covid guidelines and our immunocompromised population.

## 2021-10-17 NOTE — Progress Notes (Signed)
Hematology and Oncology Follow Up Visit  Robert [name] [id number] 02-May-1934 88 y.o. 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 He is doing well and has no complaints at this time.  His right knee and leg pain is much improved.  No swelling in his extremities.  Numbness and tingling in the finger of his right hands is unchanged from baseline.  No fever, chills, n/v, cough, rash, dizziness, SOB, chest pain, palpitations, abdominal pain or changes in bowel or bladder habits.  Appetite is good but he feels he needs to hydrate better throughout the day. His weight is stable at 222 lbs.   ECOG Performance Status: 1 - Symptomatic but completely ambulatory  Medications:  Allergies as of 10/17/2021   No Known Allergies      Medication List        Accurate as of Oct 17, 2021 10:08 AM. If you have any questions, ask your nurse or doctor.          aspirin 81 MG tablet Take 81 mg by mouth daily.   bimatoprost 0.01 % Soln Commonly known as: LUMIGAN Place 1 drop into both eyes at bedtime.   CALTRATE 600+D PLUS PO Take 1 tablet by mouth daily.   carvedilol 25 MG tablet Commonly known as: COREG Take 25 mg by mouth 2 (two) times daily.   ciclopirox 0.77 % cream Commonly known as: LOPROX Apply 1 application topically 2 (two) times daily. As needed   cloNIDine 0.1 MG tablet Commonly known as: CATAPRES Take 0.1 mg by mouth 2 (two) times daily as needed (Elevated BP).    cyanocobalamin 2000 MCG tablet Take 2,000 mcg by mouth at bedtime.   diclofenac Sodium 1 % Gel Commonly known as: VOLTAREN Apply topically 4 (four) times daily. As needed   famciclovir 250 MG tablet Commonly known as: FAMVIR TAKE 1 TABLET DAILY   furosemide 20 MG tablet Commonly known as: LASIX Take 2 tablets (40 mg total) by mouth daily.   losartan 100 MG tablet Commonly known as: COZAAR Take 100 mg by mouth daily.   meloxicam 15 MG tablet Commonly known as: MOBIC Take 1 tablet by mouth daily as needed.   metolazone 5 MG tablet Commonly known as: ZAROXOLYN TAKE 1 TABLET 1 HOUR PRIOR TO TAKING LASIX DAILY   montelukast 10 MG tablet Commonly known as: SINGULAIR TAKE 1 TABLET AT BEDTIME   multivitamin with minerals Tabs tablet Take 1 tablet by mouth daily. Unknown strenght   omeprazole 20 MG capsule Commonly known as: PRILOSEC Take 1 capsule by mouth daily before breakfast.   potassium chloride SA 20 MEQ tablet Commonly known as: KLOR-CON M Take 20 mEq by mouth daily.   rosuvastatin 20 MG tablet Commonly known as: CRESTOR Take 20 mg by mouth at bedtime.   SYSTANE OP Place 1 drop into both eyes daily as needed (dry eyes).   Vitamin D3 25 MCG (1000  UT) Caps Take 1,000 Units by mouth daily.        Allergies: No Known Allergies  Past Medical History, Surgical history, Social history, and Family History were reviewed and updated.  Review of Systems: All other 10 point review of systems is negative.   Physical Exam:  vitals were not taken for this visit.   Wt Readings from Last 3 Encounters:  10/17/21 222 lb 1.9 oz (100.8 kg)  09/17/21 218 lb (98.9 kg)  08/20/21 220 lb 1.3 oz (99.8 kg)    Ocular: Sclerae unicteric, pupils equal, round and reactive to light Ear-nose-throat: Oropharynx clear, dentition fair Lymphatic: No cervical or supraclavicular adenopathy Lungs no rales or rhonchi, good excursion bilaterally Heart regular rate and rhythm, no  murmur appreciated Abd soft, nontender, positive bowel sounds MSK no focal spinal tenderness, no joint edema Neuro: non-focal, well-oriented, appropriate affect Breasts: Deferred   Lab Results  Component Value Date   WBC 5.7 10/17/2021   HGB 11.1 (L) 10/17/2021   HCT 32.8 (L) 10/17/2021   MCV 94.3 10/17/2021   PLT 163 10/17/2021   Lab Results  Component Value Date   FERRITIN 555 (H) 07/23/2021   IRON 84 07/23/2021   TIBC 325 07/23/2021   UIBC 241 07/23/2021   IRONPCTSAT 26 07/23/2021   Lab Results  Component Value Date   RETICCTPCT 2.2 04/05/2019   RBC 3.48 (L) 10/17/2021   Lab Results  Component Value Date   KPAFRELGTCHN 10.7 09/17/2021   LAMBDASER 4.3 (L) 09/17/2021   KAPLAMBRATIO 2.49 (H) 09/17/2021   Lab Results  Component Value Date   IGGSERUM 496 (L) 09/17/2021   IGA 22 (L) 09/17/2021   IGMSERUM 35 09/17/2021   Lab Results  Component Value Date   TOTALPROTELP 6.0 09/17/2021   ALBUMINELP 3.8 09/17/2021   A1GS 0.2 09/17/2021   A2GS 0.7 09/17/2021   BETS 0.9 09/17/2021   BETA2SER 0.3 01/03/2015   GAMS 0.5 09/17/2021   MSPIKE Not Observed 09/17/2021   SPEI Comment 09/17/2021     Chemistry      Component Value Date/Time   NA 138 09/17/2021 0925   NA 137 04/08/2017 1141   NA 138 10/29/2015 1029   K 3.7 09/17/2021 0925   K 3.4 04/08/2017 1141   K 3.9 10/29/2015 1029   CL 101 09/17/2021 0925   CL 102 04/08/2017 1141   CO2 30 09/17/2021 0925   CO2 30 04/08/2017 1141   CO2 27 10/29/2015 1029   BUN 17 09/17/2021 0925   BUN 16 04/08/2017 1141   BUN 13.8 10/29/2015 1029   CREATININE 1.25 (H) 09/17/2021 0925   CREATININE 1.3 (H) 04/08/2017 1141   CREATININE 1.1 10/29/2015 1029      Component Value Date/Time   CALCIUM 10.0 09/17/2021 0925   CALCIUM 9.3 04/08/2017 1141   CALCIUM 9.3 10/29/2015 1029   ALKPHOS 37 (L) 09/17/2021 0925   ALKPHOS 66 04/08/2017 1141   ALKPHOS 52 10/29/2015 1029   AST 15 09/17/2021 0925   AST 20 10/29/2015 1029   ALT 12  09/17/2021 0925   ALT 21 04/08/2017 1141   ALT 14 10/29/2015 1029   BILITOT 0.8 09/17/2021 0925   BILITOT 0.75 10/29/2015 1029       Impression and Plan:  Robert [name] is a very pleasant 88 yo African American gentleman with relapsed IgG kappa myeloma. He has history of stem cell transplant in 2006 and since relapsed.   We will proceed with treatment today as planned.  Protein studies are  pending.  No ESA, Hgb 11.1.  Follow-up in 1 month.   Lottie Dawson, NP 5/26/202310:08 AM

## 2021-10-17 NOTE — Patient Instructions (Signed)
Implanted Port Removal, Care After The following information offers guidance on how to care for yourself after your procedure. Your health care provider may also give you more specific instructions. If you have problems or questions, contact your health care provider. What can I expect after the procedure? After the procedure, it is common to have: Soreness or pain near your incision. Some swelling or bruising near your incision. Follow these instructions at home: Medicines Take over-the-counter and prescription medicines only as told by your health care provider. If you were prescribed an antibiotic medicine, take it as told by your health care provider. Do not stop taking the antibiotic even if you start to feel better. Bathing Do not take baths, swim, or use a hot tub until your health care provider approves. Ask your health care provider if you can take showers. You may only be allowed to take sponge baths. Incision care  Follow instructions from your health care provider about how to take care of your incision. Make sure you: Wash your hands with soap and water for at least 20 seconds before and after you change your bandage (dressing). If soap and water are not available, use hand sanitizer. Change your dressing as told by your health care provider. Keep your dressing dry. Leave stitches (sutures), skin glue, or adhesive strips in place. These skin closures may need to stay in place for 2 weeks or longer. If adhesive strip edges start to loosen and curl up, you may trim the loose edges. Do not remove adhesive strips completely unless your health care provider tells you to do that. Check your incision area every day for signs of infection. Check for: More redness, swelling, or pain. More fluid or blood. Warmth. Pus or a bad smell. Activity Return to your normal activities as told by your health care provider. Ask your health care provider what activities are safe for you. You may have  to avoid lifting. Ask your health care provider how much you can safely lift. Do not do activities that involve lifting your arms over your head. Driving  If you were given a sedative during the procedure, it can affect you for several hours. Do not drive or operate machinery until your health care provider says that it is safe. If you did not receive a sedative, ask your health care provider when it is safe to drive. General instructions Do not use any products that contain nicotine or tobacco. These products include cigarettes, chewing tobacco, and vaping devices, such as e-cigarettes. These can delay healing after surgery. If you need help quitting, ask your health care provider. Keep all follow-up visits. This is important. Contact a health care provider if: You have a fever or chills. You have more redness, swelling, or pain around your incision. You have more fluid or blood coming from your incision. Your incision feels warm to the touch. You have pus or a bad smell coming from your incision. You have pain that is not relieved by your pain medicine. Get help right away if: You have chest pain. You have difficulty breathing. These symptoms may be an emergency. Get help right away. Call 911. Do not wait to see if the symptoms will go away. Do not drive yourself to the hospital. Summary After the procedure, it is common to have pain, soreness, swelling, or bruising near your incision. If you were prescribed an antibiotic medicine, take it as told by your health care provider. Do not stop taking the antibiotic even if you   start to feel better. If you were given a sedative during the procedure, it can affect you for several hours. Do not drive or operate machinery until your health care provider says that it is safe. Return to your normal activities as told by your health care provider. Ask your health care provider what activities are safe for you. This information is not intended to  replace advice given to you by your health care provider. Make sure you discuss any questions you have with your health care provider. Document Revised: 11/12/2020 Document Reviewed: 11/12/2020 [employer]vier Patient Education  2023 [employer]  

## 2021-10-20 LAB — IGG, IGA, IGM
IgA: 20 mg/dL — ABNORMAL LOW (ref 61–437)
IgG (Immunoglobin G), Serum: 492 mg/dL — ABNORMAL LOW (ref 603–1613)
IgM (Immunoglobulin M), Srm: 33 mg/dL (ref 15–143)

## 2021-10-21 LAB — PROTEIN ELECTROPHORESIS, SERUM
A/G Ratio: 1.7 (ref 0.7–1.7)
Albumin ELP: 3.6 g/dL (ref 2.9–4.4)
Alpha-1-Globulin: 0.1 g/dL (ref 0.0–0.4)
Alpha-2-Globulin: 0.8 g/dL (ref 0.4–1.0)
Beta Globulin: 0.7 g/dL (ref 0.7–1.3)
Gamma Globulin: 0.5 g/dL (ref 0.4–1.8)
Globulin, Total: 2.1 g/dL — ABNORMAL LOW (ref 2.2–3.9)
Total Protein ELP: 5.7 g/dL — ABNORMAL LOW (ref 6.0–8.5)

## 2021-10-21 LAB — KAPPA/LAMBDA LIGHT CHAINS
Kappa free light chain: 12.3 mg/L (ref 3.3–19.4)
Kappa, lambda light chain ratio: 1.45 (ref 0.26–1.65)
Lambda free light chains: 8.5 mg/L (ref 5.7–26.3)

## 2021-11-06 ENCOUNTER — Ambulatory Visit (INDEPENDENT_AMBULATORY_CARE_PROVIDER_SITE_OTHER): Payer: Medicare Other | Admitting: Physician Assistant

## 2021-11-06 ENCOUNTER — Ambulatory Visit (INDEPENDENT_AMBULATORY_CARE_PROVIDER_SITE_OTHER): Payer: Medicare Other

## 2021-11-06 DIAGNOSIS — M25512 Pain in left shoulder: Secondary | ICD-10-CM | POA: Diagnosis not present

## 2021-11-06 DIAGNOSIS — M1711 Unilateral primary osteoarthritis, right knee: Secondary | ICD-10-CM

## 2021-11-06 DIAGNOSIS — G8929 Other chronic pain: Secondary | ICD-10-CM | POA: Diagnosis not present

## 2021-11-06 MED ORDER — LIDOCAINE HCL 1 % IJ SOLN
2.0000 mL | INTRAMUSCULAR | Status: AC | PRN
  Administered 2021-11-06: 2 mL

## 2021-11-06 MED ORDER — BUPIVACAINE HCL 0.25 % IJ SOLN
2.0000 mL | INTRAMUSCULAR | Status: AC | PRN
  Administered 2021-11-06: 2 mL via INTRA_ARTICULAR

## 2021-11-06 MED ORDER — LIDOCAINE HCL 2 % IJ SOLN
2.0000 mL | INTRAMUSCULAR | Status: AC | PRN
  Administered 2021-11-06: 2 mL

## 2021-11-06 MED ORDER — METHYLPREDNISOLONE ACETATE 40 MG/ML IJ SUSP
40.0000 mg | INTRAMUSCULAR | Status: AC | PRN
  Administered 2021-11-06: 40 mg via INTRA_ARTICULAR

## 2021-11-06 NOTE — Progress Notes (Signed)
Office Visit Note   Patient: Robert [name]           Date of Birth: 1934/01/01           MRN: [id number] Visit Date: 11/06/2021              Requested by: Donnajean Lopes, MD [address] [place],  [place] [address] PCP: Donnajean Lopes, MD   Assessment & Plan: Visit Diagnoses:  1. Unilateral primary osteoarthritis, right knee   2. Chronic left shoulder pain     Plan: Impression is right knee osteoarthritis and left shoulder pain likely subacromial bursitis.  I have discussed various treatment options to include cortisone injections to both areas today.  He would like to proceed.  I will also have Dr.Xu look over his x-rays.  He will follow-up with [place] as needed.  Call with concerns or questions.  Follow-Up Instructions: Return if symptoms worsen or fail to improve.   Orders:  Orders Placed This Encounter  Procedures   Large Joint Inj: R knee   Large Joint Inj: L subacromial bursa   XR KNEE 3 VIEW RIGHT   XR Shoulder Left   No orders of the defined types were placed in this encounter.     Procedures: Large Joint Inj: R knee on 11/06/2021 10:31 AM Indications: pain Details: 22 G needle, anterolateral approach Medications: 2 mL lidocaine 1 %; 2 mL bupivacaine 0.25 %; 40 mg methylPREDNISolone acetate 40 MG/ML   Large Joint Inj: L subacromial bursa on 11/06/2021 10:31 AM Indications: pain Details: 22 G needle Medications: 2 mL lidocaine 2 %; 2 mL bupivacaine 0.25 %; 40 mg methylPREDNISolone acetate 40 MG/ML Outcome: tolerated well, no immediate complications Patient was prepped and draped in the usual sterile fashion.       Clinical Data: No additional findings.   Subjective: Chief Complaint  Patient presents with   Right Knee - Pain   Left Shoulder - Pain    HPI patient is a very pleasant 88 year old gentleman who comes in today with left shoulder and right knee pain.  In regards to the left shoulder, the pain has been ongoing for the past month.  Pain  is primarily to the deltoid and primarily only occurs when he is sleeping on the left side.  He has been using heating pad which does help.  He denies any paresthesias to the left upper extremity.  No previous cortisone injection.  In regards to the right knee, he has had pain for the past 3 months.  No known injury or change in activity.  The pain is primarily to the lateral aspect but he does note pain throughout the entire knee at times.  Pain is worse first thing in the morning but does improve throughout the day.  He has been taking meloxicam and Tylenol with mild relief.  No previous cortisone injection to the right knee.  Of note, he has a history of multiple myeloma and is in remission.  Review of Systems as detailed in HPI.  All others reviewed are negative.   Objective: Vital Signs: There were no vitals taken for this visit.  Physical Exam well-developed well-nourished gentleman in no acute distress.  Alert and oriented x3.  Ortho Exam left shoulder exam reveals full active range of motion in all planes.  Mild pain with empty can testing.  Full strength throughout.  He is neurovascular intact distally.  Right knee exam shows no effusion.  Range of motion 0 to 120  degrees.  No joint line tenderness.  Moderate patellofemoral crepitus.  Ligaments are stable.  He is neurovascular tact distally.  Negative logroll negative FADIR.  Negative straight leg raise.  Specialty Comments:  No specialty comments available.  Imaging: XR KNEE 3 VIEW RIGHT  Result Date: 11/06/2021 X-rays demonstrate moderate degenerative changes to the medial and patellofemoral compartments  XR Shoulder Left  Result Date: 11/06/2021 X-rays demonstrate mild degenerative changes to the before meals joint.  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   Goals of care, counseling/discussion 05/12/2018   Syncope 12/24/2017   Groin lump 12/28/2016   Lytic bone lesions on xray 03/17/2016   Cerumen debris on tympanic membrane 11/28/2015   White coat syndrome with diagnosis of hypertension 11/28/2015   Dyslipidemia 04/08/2015   Myeloma ([place]) 07/02/2011   ED (erectile dysfunction) 06/02/2010   Benign essential hypertension 03/19/2010   Multiple myeloma in remission ([place] [facility]) 03/19/2010   B12 deficiency 02/17/2010   S/P autologous bone marrow transplantation ([facility]) 04/29/2005   IgG multiple myeloma ([facility]) 07/23/2004   Past Medical History:  Diagnosis Date   Anemia of chronic renal failure, stage 3 (moderate) ([facility]) 10/06/2018   Arthritis    B12 deficiency 02/17/2010   Benign essential hypertension 03/19/2010   Cerumen debris on tympanic membrane 11/28/2015   Dyslipidemia 04/08/2015   ED (erectile dysfunction) 06/02/2010   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   Glaucoma of both eyes 10/10/2019   Goals of care, counseling/discussion 05/12/2018   Groin lump 12/28/2016   Hyperlipidemia    Hypertension    IgG multiple myeloma ([facility]) 07/23/2004   Iron deficiency anemia 10/07/2018   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   Lytic bone lesions on xray 03/17/2016   Multiple myeloma ([facility])    2006   Multiple myeloma in remission ([facility]) 03/19/2010   Myeloma ([facility]) 07/02/2011   S/P autologous bone marrow transplantation ([facility]) 04/29/2005   Swelling of both lower extremities 10/18/2018   Syncope 12/24/2017   White coat syndrome with diagnosis of hypertension 11/28/2015    Family History  Problem Relation Age of Onset   Heart attack Father    Heart disease Father    Throat cancer Mother     Diabetes Brother    Colon cancer Neg Hx     Past Surgical History:  Procedure Laterality Date   CATARACT EXTRACTION BILATERAL W/ ANTERIOR VITRECTOMY     IR IMAGING GUIDED PORT INSERTION  05/30/2018   LIMBAL STEM CELL TRANSPLANT     Social History   Occupational History   Not on file  Tobacco Use   Smoking status: Former    Packs/day: 4.00    Years: 9.00    Total pack years: 36.00    Types: Cigars, Cigarettes    Start date: 07/08/1979    Quit date: 07/07/1988    Years since quitting: 33.3   Smokeless tobacco: Never   Tobacco comments:    quit 24 years ago  Vaping Use   Vaping Use: Never used  Substance and Sexual Activity   Alcohol use: Yes  Alcohol/week: 6.0 standard drinks of alcohol    Types: 6 Glasses of wine per week    Comment: very seldom   Drug use: No   Sexual activity: Not on file

## 2021-11-17 ENCOUNTER — Inpatient Hospital Stay (HOSPITAL_BASED_OUTPATIENT_CLINIC_OR_DEPARTMENT_OTHER): Payer: Medicare Other | Admitting: Family

## 2021-11-17 ENCOUNTER — Inpatient Hospital Stay: Payer: Medicare Other

## 2021-11-17 ENCOUNTER — Encounter: Payer: Self-pay | Admitting: Family

## 2021-11-17 ENCOUNTER — Inpatient Hospital Stay: Payer: Medicare Other | Attending: Hematology & Oncology

## 2021-11-17 VITALS — BP 154/72 | HR 69 | Temp 97.7°F | Resp 18 | Wt 213.1 lb

## 2021-11-17 VITALS — BP 139/87 | HR 57 | Resp 16

## 2021-11-17 DIAGNOSIS — C9002 Multiple myeloma in relapse: Secondary | ICD-10-CM | POA: Diagnosis present

## 2021-11-17 DIAGNOSIS — D631 Anemia in chronic kidney disease: Secondary | ICD-10-CM | POA: Diagnosis not present

## 2021-11-17 DIAGNOSIS — Z5112 Encounter for antineoplastic immunotherapy: Secondary | ICD-10-CM | POA: Diagnosis present

## 2021-11-17 DIAGNOSIS — C9000 Multiple myeloma not having achieved remission: Secondary | ICD-10-CM

## 2021-11-17 DIAGNOSIS — D630 Anemia in neoplastic disease: Secondary | ICD-10-CM | POA: Insufficient documentation

## 2021-11-17 DIAGNOSIS — Z79899 Other long term (current) drug therapy: Secondary | ICD-10-CM | POA: Insufficient documentation

## 2021-11-17 DIAGNOSIS — N183 Chronic kidney disease, stage 3 unspecified: Secondary | ICD-10-CM

## 2021-11-17 DIAGNOSIS — M899 Disorder of bone, unspecified: Secondary | ICD-10-CM

## 2021-11-17 LAB — CMP (CANCER CENTER ONLY)
ALT: 14 U/L (ref 0–44)
AST: 14 U/L — ABNORMAL LOW (ref 15–41)
Albumin: 4.4 g/dL (ref 3.5–5.0)
Alkaline Phosphatase: 41 U/L (ref 38–126)
Anion gap: 7 (ref 5–15)
BUN: 34 mg/dL — ABNORMAL HIGH (ref 8–23)
CO2: 28 mmol/L (ref 22–32)
Calcium: 10 mg/dL (ref 8.9–10.3)
Chloride: 101 mmol/L (ref 98–111)
Creatinine: 1.3 mg/dL — ABNORMAL HIGH (ref 0.61–1.24)
GFR, Estimated: 54 mL/min — ABNORMAL LOW (ref >60)
Glucose, Bld: 110 mg/dL — ABNORMAL HIGH (ref 70–99)
Potassium: 3.6 mmol/L (ref 3.5–5.1)
Sodium: 136 mmol/L (ref 135–145)
Total Bilirubin: 0.7 mg/dL (ref 0.3–1.2)
Total Protein: 6.4 g/dL — ABNORMAL LOW (ref 6.5–8.1)

## 2021-11-17 LAB — CBC WITH DIFFERENTIAL (CANCER CENTER ONLY)
Abs Immature Granulocytes: 0.04 10*3/uL (ref 0.00–0.07)
Basophils Absolute: 0 10*3/uL (ref 0.0–0.1)
Basophils Relative: 0 %
Eosinophils Absolute: 0.1 10*3/uL (ref 0.0–0.5)
Eosinophils Relative: 1 %
HCT: 36.1 % — ABNORMAL LOW (ref 39.0–52.0)
Hemoglobin: 12 g/dL — ABNORMAL LOW (ref 13.0–17.0)
Immature Granulocytes: 1 %
Lymphocytes Relative: 38 %
Lymphs Abs: 2.8 10*3/uL (ref 0.7–4.0)
MCH: 32 pg (ref 26.0–34.0)
MCHC: 33.2 g/dL (ref 30.0–36.0)
MCV: 96.3 fL (ref 80.0–100.0)
Monocytes Absolute: 0.6 10*3/uL (ref 0.1–1.0)
Monocytes Relative: 8 %
Neutro Abs: 3.9 10*3/uL (ref 1.7–7.7)
Neutrophils Relative %: 52 %
Platelet Count: 211 10*3/uL (ref 150–400)
RBC: 3.75 MIL/uL — ABNORMAL LOW (ref 4.22–5.81)
RDW: 13.4 % (ref 11.5–15.5)
WBC Count: 7.5 10*3/uL (ref 4.0–10.5)
nRBC: 0 % (ref 0.0–0.2)

## 2021-11-17 LAB — LACTATE DEHYDROGENASE: LDH: 178 U/L (ref 98–192)

## 2021-11-17 MED ORDER — DARATUMUMAB-HYALURONIDASE-FIHJ 1800-30000 MG-UT/15ML ~~LOC~~ SOLN
1800.0000 mg | Freq: Once | SUBCUTANEOUS | Status: AC
  Administered 2021-11-17: 1800 mg via SUBCUTANEOUS
  Filled 2021-11-17: qty 15

## 2021-11-17 MED ORDER — SODIUM CHLORIDE 0.9 % IV SOLN: Freq: Once | INTRAVENOUS | Status: DC

## 2021-11-17 MED ORDER — DIPHENHYDRAMINE HCL 25 MG PO CAPS
50.0000 mg | ORAL_CAPSULE | Freq: Once | ORAL | Status: AC
  Administered 2021-11-17: 50 mg via ORAL
  Filled 2021-11-17: qty 2

## 2021-11-17 MED ORDER — HEPARIN SOD (PORK) LOCK FLUSH 100 UNIT/ML IV SOLN
500.0000 [IU] | Freq: Once | INTRAVENOUS | Status: AC | PRN
  Administered 2021-11-17: 500 [IU]

## 2021-11-17 MED ORDER — DEXAMETHASONE 4 MG PO TABS
20.0000 mg | ORAL_TABLET | Freq: Once | ORAL | Status: AC
  Administered 2021-11-17: 20 mg via ORAL
  Filled 2021-11-17: qty 5

## 2021-11-17 MED ORDER — ACETAMINOPHEN 325 MG PO TABS
650.0000 mg | ORAL_TABLET | Freq: Once | ORAL | Status: AC
  Administered 2021-11-17: 650 mg via ORAL
  Filled 2021-11-17: qty 2

## 2021-11-17 MED ORDER — SODIUM CHLORIDE 0.9% FLUSH
10.0000 mL | INTRAVENOUS | Status: DC | PRN
  Administered 2021-11-17: 10 mL

## 2021-11-18 LAB — IGG, IGA, IGM
IgA: 25 mg/dL — ABNORMAL LOW (ref 61–437)
IgG (Immunoglobin G), Serum: 526 mg/dL — ABNORMAL LOW (ref 603–1613)
IgM (Immunoglobulin M), Srm: 47 mg/dL (ref 15–143)

## 2021-11-18 LAB — KAPPA/LAMBDA LIGHT CHAINS
Kappa free light chain: 13.7 mg/L (ref 3.3–19.4)
Kappa, lambda light chain ratio: 2.04 — ABNORMAL HIGH (ref 0.26–1.65)
Lambda free light chains: 6.7 mg/L (ref 5.7–26.3)

## 2021-11-19 LAB — PROTEIN ELECTROPHORESIS, SERUM
A/G Ratio: 1.6 (ref 0.7–1.7)
Albumin ELP: 3.6 g/dL (ref 2.9–4.4)
Alpha-1-Globulin: 0.2 g/dL (ref 0.0–0.4)
Alpha-2-Globulin: 0.9 g/dL (ref 0.4–1.0)
Beta Globulin: 0.8 g/dL (ref 0.7–1.3)
Gamma Globulin: 0.5 g/dL (ref 0.4–1.8)
Globulin, Total: 2.3 g/dL (ref 2.2–3.9)
Total Protein ELP: 5.9 g/dL — ABNORMAL LOW (ref 6.0–8.5)

## 2021-11-20 ENCOUNTER — Telehealth: Payer: Self-pay | Admitting: *Deleted

## 2021-11-20 NOTE — Telephone Encounter (Signed)
Per 11/17/21 los - called and lvm of upcoming appointments - requested call back to confirm

## 2021-12-15 ENCOUNTER — Other Ambulatory Visit: Payer: Self-pay

## 2021-12-17 ENCOUNTER — Inpatient Hospital Stay: Payer: Medicare Other | Attending: Hematology & Oncology

## 2021-12-17 ENCOUNTER — Encounter: Payer: Self-pay | Admitting: Family

## 2021-12-17 ENCOUNTER — Inpatient Hospital Stay (HOSPITAL_BASED_OUTPATIENT_CLINIC_OR_DEPARTMENT_OTHER): Payer: Medicare Other | Admitting: Family

## 2021-12-17 ENCOUNTER — Inpatient Hospital Stay: Payer: Medicare Other

## 2021-12-17 ENCOUNTER — Telehealth: Payer: Self-pay | Admitting: *Deleted

## 2021-12-17 VITALS — BP 148/79 | HR 64 | Temp 97.6°F | Resp 18 | Wt 216.0 lb

## 2021-12-17 VITALS — BP 139/87 | HR 58

## 2021-12-17 DIAGNOSIS — D631 Anemia in chronic kidney disease: Secondary | ICD-10-CM

## 2021-12-17 DIAGNOSIS — M899 Disorder of bone, unspecified: Secondary | ICD-10-CM

## 2021-12-17 DIAGNOSIS — C9000 Multiple myeloma not having achieved remission: Secondary | ICD-10-CM

## 2021-12-17 DIAGNOSIS — Z5112 Encounter for antineoplastic immunotherapy: Secondary | ICD-10-CM | POA: Insufficient documentation

## 2021-12-17 DIAGNOSIS — Z79899 Other long term (current) drug therapy: Secondary | ICD-10-CM | POA: Insufficient documentation

## 2021-12-17 DIAGNOSIS — C9002 Multiple myeloma in relapse: Secondary | ICD-10-CM | POA: Insufficient documentation

## 2021-12-17 DIAGNOSIS — N183 Chronic kidney disease, stage 3 unspecified: Secondary | ICD-10-CM

## 2021-12-17 LAB — CBC WITH DIFFERENTIAL (CANCER CENTER ONLY)
Abs Immature Granulocytes: 0.02 10*3/uL (ref 0.00–0.07)
Basophils Absolute: 0 10*3/uL (ref 0.0–0.1)
Basophils Relative: 1 %
Eosinophils Absolute: 0.1 10*3/uL (ref 0.0–0.5)
Eosinophils Relative: 1 %
HCT: 33.9 % — ABNORMAL LOW (ref 39.0–52.0)
Hemoglobin: 11.2 g/dL — ABNORMAL LOW (ref 13.0–17.0)
Immature Granulocytes: 0 %
Lymphocytes Relative: 43 %
Lymphs Abs: 2.8 10*3/uL (ref 0.7–4.0)
MCH: 31.8 pg (ref 26.0–34.0)
MCHC: 33 g/dL (ref 30.0–36.0)
MCV: 96.3 fL (ref 80.0–100.0)
Monocytes Absolute: 0.6 10*3/uL (ref 0.1–1.0)
Monocytes Relative: 9 %
Neutro Abs: 2.9 10*3/uL (ref 1.7–7.7)
Neutrophils Relative %: 46 %
Platelet Count: 177 10*3/uL (ref 150–400)
RBC: 3.52 MIL/uL — ABNORMAL LOW (ref 4.22–5.81)
RDW: 13.2 % (ref 11.5–15.5)
WBC Count: 6.4 10*3/uL (ref 4.0–10.5)
nRBC: 0 % (ref 0.0–0.2)

## 2021-12-17 LAB — LACTATE DEHYDROGENASE: LDH: 218 U/L — ABNORMAL HIGH (ref 98–192)

## 2021-12-17 LAB — CMP (CANCER CENTER ONLY)
ALT: 14 U/L (ref 0–44)
AST: 18 U/L (ref 15–41)
Albumin: 4.2 g/dL (ref 3.5–5.0)
Alkaline Phosphatase: 43 U/L (ref 38–126)
Anion gap: 7 (ref 5–15)
BUN: 22 mg/dL (ref 8–23)
CO2: 28 mmol/L (ref 22–32)
Calcium: 10.2 mg/dL (ref 8.9–10.3)
Chloride: 102 mmol/L (ref 98–111)
Creatinine: 1.29 mg/dL — ABNORMAL HIGH (ref 0.61–1.24)
GFR, Estimated: 54 mL/min — ABNORMAL LOW (ref >60)
Glucose, Bld: 111 mg/dL — ABNORMAL HIGH (ref 70–99)
Potassium: 3.7 mmol/L (ref 3.5–5.1)
Sodium: 137 mmol/L (ref 135–145)
Total Bilirubin: 0.8 mg/dL (ref 0.3–1.2)
Total Protein: 6.1 g/dL — ABNORMAL LOW (ref 6.5–8.1)

## 2021-12-17 MED ORDER — ACETAMINOPHEN 325 MG PO TABS
650.0000 mg | ORAL_TABLET | Freq: Once | ORAL | Status: AC
  Administered 2021-12-17: 650 mg via ORAL
  Filled 2021-12-17: qty 2

## 2021-12-17 MED ORDER — DEXAMETHASONE 4 MG PO TABS
20.0000 mg | ORAL_TABLET | Freq: Once | ORAL | Status: AC
  Administered 2021-12-17: 20 mg via ORAL
  Filled 2021-12-17: qty 5

## 2021-12-17 MED ORDER — SODIUM CHLORIDE 0.9% FLUSH
10.0000 mL | INTRAVENOUS | Status: DC | PRN
  Administered 2021-12-17: 10 mL

## 2021-12-17 MED ORDER — HEPARIN SOD (PORK) LOCK FLUSH 100 UNIT/ML IV SOLN
500.0000 [IU] | Freq: Once | INTRAVENOUS | Status: AC | PRN
  Administered 2021-12-17: 500 [IU]

## 2021-12-17 MED ORDER — DIPHENHYDRAMINE HCL 25 MG PO CAPS
50.0000 mg | ORAL_CAPSULE | Freq: Once | ORAL | Status: AC
  Administered 2021-12-17: 50 mg via ORAL
  Filled 2021-12-17: qty 2

## 2021-12-17 MED ORDER — SODIUM CHLORIDE 0.9 % IV SOLN: Freq: Once | INTRAVENOUS | Status: DC

## 2021-12-17 MED ORDER — DARATUMUMAB-HYALURONIDASE-FIHJ 1800-30000 MG-UT/15ML ~~LOC~~ SOLN
1800.0000 mg | Freq: Once | SUBCUTANEOUS | Status: AC
  Administered 2021-12-17: 1800 mg via SUBCUTANEOUS
  Filled 2021-12-17: qty 15

## 2021-12-17 NOTE — Patient Instructions (Signed)
[facility]  Discharge Instructions: Thank you for choosing [facility] to provide your oncology and hematology care.   If you have a lab appointment with the [facility], please go directly to the [facility] and check in at the registration area.  Wear comfortable clothing and clothing appropriate for easy access to any Portacath or PICC line.   We strive to give you quality time with your provider. You may need to reschedule your appointment if you arrive late (15 or more minutes).  Arriving late affects you and other patients whose appointments are after yours.  Also, if you miss three or more appointments without notifying the office, you may be dismissed from the clinic at the provider's discretion.      For prescription refill requests, have your pharmacy contact our office and allow 72 hours for refills to be completed.    Today you received the following chemotherapy and/or immunotherapy agents Darazalex      To help prevent nausea and vomiting after your treatment, we encourage you to take your nausea medication as directed.  BELOW ARE SYMPTOMS THAT SHOULD BE REPORTED IMMEDIATELY: *FEVER GREATER THAN 100.4 F (38 C) OR HIGHER *CHILLS OR SWEATING *NAUSEA AND VOMITING THAT IS NOT CONTROLLED WITH YOUR NAUSEA MEDICATION *UNUSUAL SHORTNESS OF BREATH *UNUSUAL BRUISING OR BLEEDING *URINARY PROBLEMS (pain or burning when urinating, or frequent urination) *BOWEL PROBLEMS (unusual diarrhea, constipation, pain near the anus) TENDERNESS IN MOUTH AND THROAT WITH OR WITHOUT PRESENCE OF ULCERS (sore throat, sores in mouth, or a toothache) UNUSUAL RASH, SWELLING OR PAIN  UNUSUAL VAGINAL DISCHARGE OR ITCHING   Items with * indicate a potential emergency and should be followed up as soon as possible or go to the Emergency Department if any problems should occur.  Please show the CHEMOTHERAPY ALERT CARD or IMMUNOTHERAPY ALERT CARD at check-in to the  Emergency Department and triage nurse. Should you have questions after your visit or need to cancel or reschedule your appointment, please contact [facility]  [phone] and follow the prompts.  Office hours are 8:00 a.m. to 4:30 p.m. Monday - Friday. Please note that voicemails left after 4:00 p.m. may not be returned until the following business day.  We are closed weekends and major holidays. You have access to a nurse at all times for urgent questions. Please call the main number to the clinic [phone] and follow the prompts.  For any non-urgent questions, you may also contact your provider using MyChart. We now offer e-Visits for anyone 86 and older to request care online for non-urgent symptoms. For details visit [name].   Also download the MyChart app! Go to the app store, search "MyChart", open the app, select [facility], and log in with your MyChart username and password.  Masks are optional in the cancer centers. If you would like for your care team to wear a mask while they are taking care of you, please let them know. For doctor visits, patients may have with them one support person who is at least 86 years old. At this time, visitors are not allowed in the infusion area.

## 2021-12-17 NOTE — Telephone Encounter (Signed)
Per 12/17/21 los - called and lvm of upcoming appointments -requested callback to confirm

## 2021-12-17 NOTE — Progress Notes (Signed)
Hematology and Oncology Follow Up Visit  Robert [name] [id number] Mar 07, 1934 88 y.o. 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 He is doing well but notes mild fatigue at times.  He has been enjoying getting out in his garage and yard but can not spend a lot of time in this summer heat.  Last month, no M-spike detected, IgG level was 526 mg/dL and kappa light chains 1.37 mg/dL.  No fever, chills, n/v, cough, rash, dizziness, SOB, chest pain, palpitations, abdominal pain or changes in bowel or bladder habits.  No swelling, tenderness, numbness or tingling in his extremities at this time.  Injections with ortho have helped the pain in his left shoulder and right knee.  No falls or syncope.  Appetite and hydration have been good. His weight is stable at 216 lbs.   ECOG Performance Status: 1 - Symptomatic but completely ambulatory  Medications:  Allergies as of 12/17/2021   No Known Allergies      Medication List        Accurate as of December 17, 2021 10:45 AM. If you have any questions, ask your nurse or doctor.          aspirin 81 MG tablet Take 81 mg by mouth daily.   bimatoprost 0.01 % Soln Commonly known as: LUMIGAN Place 1 drop into both eyes at bedtime.   CALTRATE 600+D PLUS PO Take 1 tablet by mouth daily.   carvedilol 25 MG tablet Commonly known as: COREG Take 25 mg by mouth 2 (two) times daily.   ciclopirox 0.77 % cream Commonly known as: LOPROX Apply 1  application topically 2 (two) times daily. As needed   cloNIDine 0.1 MG tablet Commonly known as: CATAPRES Take 0.1 mg by mouth 2 (two) times daily as needed (Elevated BP).   cyanocobalamin 2000 MCG tablet Take 2,000 mcg by mouth at bedtime.   diclofenac Sodium 1 % Gel Commonly known as: VOLTAREN Apply topically 4 (four) times daily. As needed   famciclovir 250 MG tablet Commonly known as: FAMVIR TAKE 1 TABLET DAILY   furosemide 20 MG tablet Commonly known as: LASIX Take 2 tablets (40 mg total) by mouth daily.   losartan 100 MG tablet Commonly known as: COZAAR Take 100 mg by mouth daily.   meloxicam 15 MG tablet Commonly known as: MOBIC Take 1 tablet by mouth daily as needed.   metolazone 5 MG tablet Commonly known as: ZAROXOLYN TAKE 1 TABLET 1 HOUR PRIOR TO TAKING LASIX DAILY   montelukast 10 MG tablet Commonly known as: SINGULAIR TAKE 1 TABLET AT BEDTIME   multivitamin with minerals Tabs tablet Take 1 tablet by mouth daily. Unknown strenght   omeprazole 20 MG capsule Commonly known as: PRILOSEC Take 1 capsule by mouth daily before breakfast.   potassium chloride SA 20 MEQ tablet Commonly known as: KLOR-CON M Take 20 mEq by mouth daily.   rosuvastatin 20 MG  tablet Commonly known as: CRESTOR Take 20 mg by mouth at bedtime.   SYSTANE OP Place 1 drop into both eyes daily as needed (dry eyes).   Vitamin D3 25 MCG (1000 UT) Caps Take 1,000 Units by mouth daily.        Allergies: No Known Allergies  Past Medical History, Surgical history, Social history, and Family History were reviewed and updated.  Review of Systems: All other 10 point review of systems is negative.   Physical Exam:  weight is 216 lb (98 kg). His oral temperature is 97.6 F (36.4 C). His blood pressure is 148/79 (abnormal) and his pulse is 64. His respiration is 18 and oxygen saturation is 100%.   Wt Readings from Last 3 Encounters:  12/17/21 216 lb (98 kg)  11/17/21 213 lb 1.3  oz (96.7 kg)  10/17/21 222 lb 1.9 oz (100.8 kg)    Ocular: Sclerae unicteric, pupils equal, round and reactive to light Ear-nose-throat: Oropharynx clear, dentition fair Lymphatic: No cervical or supraclavicular adenopathy Lungs no rales or rhonchi, good excursion bilaterally Heart regular rate and rhythm, no murmur appreciated Abd soft, nontender, positive bowel sounds MSK no focal spinal tenderness, no joint edema Neuro: non-focal, well-oriented, appropriate affect Breasts: Deferred   Lab Results  Component Value Date   WBC 6.4 12/17/2021   HGB 11.2 (L) 12/17/2021   HCT 33.9 (L) 12/17/2021   MCV 96.3 12/17/2021   PLT 177 12/17/2021   Lab Results  Component Value Date   FERRITIN 555 (H) 07/23/2021   IRON 84 07/23/2021   TIBC 325 07/23/2021   UIBC 241 07/23/2021   IRONPCTSAT 26 07/23/2021   Lab Results  Component Value Date   RETICCTPCT 2.2 04/05/2019   RBC 3.52 (L) 12/17/2021   Lab Results  Component Value Date   KPAFRELGTCHN 13.7 11/17/2021   LAMBDASER 6.7 11/17/2021   KAPLAMBRATIO 2.04 (H) 11/17/2021   Lab Results  Component Value Date   IGGSERUM 526 (L) 11/17/2021   IGA 25 (L) 11/17/2021   IGMSERUM 47 11/17/2021   Lab Results  Component Value Date   TOTALPROTELP 5.9 (L) 11/17/2021   ALBUMINELP 3.6 11/17/2021   A1GS 0.2 11/17/2021   A2GS 0.9 11/17/2021   BETS 0.8 11/17/2021   BETA2SER 0.3 01/03/2015   GAMS 0.5 11/17/2021   MSPIKE Not Observed 11/17/2021   SPEI Comment 11/17/2021     Chemistry      Component Value Date/Time   NA 137 12/17/2021 1010   NA 137 04/08/2017 1141   NA 138 10/29/2015 1029   K 3.7 12/17/2021 1010   K 3.4 04/08/2017 1141   K 3.9 10/29/2015 1029   CL 102 12/17/2021 1010   CL 102 04/08/2017 1141   CO2 28 12/17/2021 1010   CO2 30 04/08/2017 1141   CO2 27 10/29/2015 1029   BUN 22 12/17/2021 1010   BUN 16 04/08/2017 1141   BUN 13.8 10/29/2015 1029   CREATININE 1.29 (H) 12/17/2021 1010   CREATININE 1.3 (H) 04/08/2017  1141   CREATININE 1.1 10/29/2015 1029      Component Value Date/Time   CALCIUM 10.2 12/17/2021 1010   CALCIUM 9.3 04/08/2017 1141   CALCIUM 9.3 10/29/2015 1029   ALKPHOS 43 12/17/2021 1010   ALKPHOS 66 04/08/2017 1141   ALKPHOS 52 10/29/2015 1029   AST 18 12/17/2021 1010   AST 20 10/29/2015 1029   ALT 14 12/17/2021 1010   ALT 21 04/08/2017 1141   ALT 14 10/29/2015 1029   BILITOT 0.8  12/17/2021 1010   BILITOT 0.75 10/29/2015 1029       Impression and Plan: Mr. Shuey is a very pleasant 88 yo African American gentleman with relapsed IgG kappa myeloma. He has history of stem cell transplant in 2006 and since relapsed.   We will proceed with Daratumumab today as planned.  No ESA needed, Hgb 11.2.  Follow-up in 1 month.   Lottie Dawson, NP 7/26/202310:45 AM

## 2021-12-17 NOTE — Patient Instructions (Signed)

## 2021-12-18 ENCOUNTER — Other Ambulatory Visit: Payer: Self-pay

## 2021-12-18 LAB — KAPPA/LAMBDA LIGHT CHAINS
Kappa free light chain: 12.5 mg/L (ref 3.3–19.4)
Kappa, lambda light chain ratio: 2.66 — ABNORMAL HIGH (ref 0.26–1.65)
Lambda free light chains: 4.7 mg/L — ABNORMAL LOW (ref 5.7–26.3)

## 2021-12-19 ENCOUNTER — Other Ambulatory Visit: Payer: Self-pay

## 2021-12-19 LAB — IGG, IGA, IGM
IgA: 23 mg/dL — ABNORMAL LOW (ref 61–437)
IgG (Immunoglobin G), Serum: 495 mg/dL — ABNORMAL LOW (ref 603–1613)
IgM (Immunoglobulin M), Srm: 38 mg/dL (ref 15–143)

## 2021-12-22 LAB — PROTEIN ELECTROPHORESIS, SERUM
A/G Ratio: 1.5 (ref 0.7–1.7)
Albumin ELP: 3.6 g/dL (ref 2.9–4.4)
Alpha-1-Globulin: 0.2 g/dL (ref 0.0–0.4)
Alpha-2-Globulin: 0.9 g/dL (ref 0.4–1.0)
Beta Globulin: 0.8 g/dL (ref 0.7–1.3)
Gamma Globulin: 0.5 g/dL (ref 0.4–1.8)
Globulin, Total: 2.4 g/dL (ref 2.2–3.9)
Total Protein ELP: 6 g/dL (ref 6.0–8.5)

## 2021-12-23 ENCOUNTER — Other Ambulatory Visit: Payer: Self-pay

## 2021-12-30 ENCOUNTER — Ambulatory Visit (INDEPENDENT_AMBULATORY_CARE_PROVIDER_SITE_OTHER): Payer: Medicare Other | Admitting: Orthopaedic Surgery

## 2021-12-30 ENCOUNTER — Telehealth: Payer: Self-pay

## 2021-12-30 DIAGNOSIS — M1711 Unilateral primary osteoarthritis, right knee: Secondary | ICD-10-CM

## 2021-12-30 MED ORDER — ACETAMINOPHEN-CODEINE 300-30 MG PO TABS: 1.0000 | ORAL_TABLET | Freq: Three times a day (TID) | ORAL | 2 refills | Status: DC | PRN

## 2021-12-30 NOTE — Progress Notes (Unsigned)
Office Visit Note   Patient: Robert [name]           Date of Birth: [birth year]           MRN: [id number] Visit Date: 12/30/2021              Requested by: Donnajean Lopes, MD [address] [place],  [place] [address] PCP: Donnajean Lopes, MD   Assessment & Plan: Visit Diagnoses:  1. Unilateral primary osteoarthritis, right knee     Plan: Impression is right knee advanced degenerative joint disease medial patellofemoral compartments.  Today, we discussed various treatment options to include viscosupplementation injection versus total knee arthroplasty.  He is not currently interested in knee replacement surgery.  He would like to get approval for viscosupplementation injection, however.  He will follow-up with [place] once approved.  Call with concerns or questions.  This patient is diagnosed with osteoarthritis of the knee(s).    Radiographs show evidence of joint space narrowing, osteophytes, subchondral sclerosis and/or subchondral cysts.  This patient has knee pain which interferes with functional and activities of daily living.    This patient has experienced inadequate response, adverse effects and/or intolerance with conservative treatments such as acetaminophen, NSAIDS, topical creams, physical therapy or regular exercise, knee bracing and/or weight loss.   This patient has experienced inadequate response or has a contraindication to intra articular steroid injections for at least 3 months.   This patient is not scheduled to have a total knee replacement within 6 months of starting treatment with viscosupplementation.   Follow-Up Instructions: Return for once approved for visco inj.   Orders:  No orders of the defined types were placed in this encounter.  Meds ordered this encounter  Medications   acetaminophen-codeine (TYLENOL #3) 300-30 MG tablet    Sig: Take 1 tablet by mouth every 8 (eight) hours as needed for moderate pain.    Dispense:  30 tablet    Refill:  2       Procedures: No procedures performed   Clinical Data: No additional findings.   Subjective: Chief Complaint  Patient presents with   Right Knee - Follow-up    HPI patient is a pleasant 88 year old gentleman who comes in today with recurrent right knee pain.  History of osteoarthritis.  He was seen by [place] about 6 weeks ago where cortisone injection was performed in the right knee.  This has temporarily helped but his pain has started to return.  Pain is worse with walking.  He denies any pain at rest or at night.  He has been taking Tylenol arthritis which does seem to help quite a bit.  A previous viscosupplementation injection.  Review of Systems as detailed in HPI.  All others reviewed and are negative.   Objective: Vital Signs: There were no vitals taken for this visit.  Physical Exam well-developed well-nourished gentleman in no acute distress.  Alert and oriented x 3.  Ortho Exam right knee exam shows range of motion from 0 to 120 degrees.  No joint line tenderness.  She does have mild patellofemoral crepitus.  He is neurovascular intact distally.  Specialty Comments:  No specialty comments available.  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   Goals of care, counseling/discussion 05/12/2018   Syncope 12/24/2017   Groin lump 12/28/2016   Lytic bone lesions on xray 03/17/2016   Cerumen debris on tympanic membrane 11/28/2015   White coat syndrome with diagnosis of hypertension 11/28/2015   Dyslipidemia 04/08/2015   Myeloma ([facility]) 07/02/2011   ED (erectile dysfunction)  06/02/2010   Benign essential hypertension 03/19/2010   Multiple myeloma in remission ([facility]) 03/19/2010   B12 deficiency 02/17/2010   S/P autologous bone marrow transplantation ([place]) 04/29/2005   IgG multiple myeloma ([facility]) 07/23/2004   Past Medical History:  Diagnosis Date   Anemia of chronic renal failure, stage 3 (moderate) ([facility]) 10/06/2018   Arthritis    B12 deficiency 02/17/2010   Benign essential hypertension 03/19/2010   Cerumen debris on tympanic membrane 11/28/2015   Dyslipidemia 04/08/2015   ED (erectile dysfunction) 06/02/2010   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   Glaucoma of both eyes 10/10/2019   Goals of care, counseling/discussion 05/12/2018   Groin lump 12/28/2016   Hyperlipidemia    Hypertension    IgG multiple myeloma ([facility]) 07/23/2004   Iron deficiency anemia 10/07/2018   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   Lytic bone lesions on xray 03/17/2016   Multiple myeloma ([facility])    2006   Multiple myeloma in remission ([facility]) 03/19/2010   Myeloma ([facility]) 07/02/2011   S/P autologous bone marrow transplantation ([facility]) 04/29/2005   Swelling of both lower extremities 10/18/2018   Syncope 12/24/2017   White coat syndrome with diagnosis of hypertension 11/28/2015    Family History  Problem Relation Age of Onset   Heart attack Father    Heart disease Father    Throat cancer Mother    Diabetes Brother    Colon cancer Neg Hx     Past Surgical History:  Procedure Laterality Date   CATARACT EXTRACTION BILATERAL W/ ANTERIOR VITRECTOMY     IR IMAGING GUIDED PORT INSERTION  05/30/2018   LIMBAL STEM CELL TRANSPLANT     Social History   Occupational History   Not on file  Tobacco Use   Smoking status: Former    Packs/day: 4.00    Years: 9.00    Total pack years: 36.00    Types: Cigars, Cigarettes    Start date: 07/08/1979    Quit date: 07/07/1988    Years since quitting: 33.5   Smokeless tobacco: Never   Tobacco comments:    quit 24 years ago  Vaping Use   Vaping Use: Never used   Substance and Sexual Activity   Alcohol use: Yes    Alcohol/week: 6.0 standard drinks of alcohol    Types: 6 Glasses of wine per week    Comment: very seldom   Drug use: No   Sexual activity: Not on file

## 2021-12-30 NOTE — Telephone Encounter (Signed)
Right knee gel injection  

## 2022-01-01 NOTE — Telephone Encounter (Signed)
VOB submitted for Monovisc, right knee. 

## 2022-01-08 ENCOUNTER — Ambulatory Visit (INDEPENDENT_AMBULATORY_CARE_PROVIDER_SITE_OTHER): Payer: Medicare Other | Admitting: Cardiology

## 2022-01-08 ENCOUNTER — Other Ambulatory Visit: Payer: Self-pay

## 2022-01-08 ENCOUNTER — Encounter: Payer: Self-pay | Admitting: Cardiology

## 2022-01-08 VITALS — BP 112/70 | HR 78 | Ht 71.0 in | Wt 218.0 lb

## 2022-01-08 DIAGNOSIS — E785 Hyperlipidemia, unspecified: Secondary | ICD-10-CM

## 2022-01-08 DIAGNOSIS — C9000 Multiple myeloma not having achieved remission: Secondary | ICD-10-CM | POA: Diagnosis not present

## 2022-01-08 DIAGNOSIS — I10 Essential (primary) hypertension: Secondary | ICD-10-CM

## 2022-01-08 DIAGNOSIS — R0609 Other forms of dyspnea: Secondary | ICD-10-CM | POA: Diagnosis not present

## 2022-01-08 MED ORDER — FUROSEMIDE 20 MG PO TABS: 40.0000 mg | ORAL_TABLET | Freq: Every day | ORAL | 1 refills | Status: DC

## 2022-01-08 NOTE — Patient Instructions (Signed)

## 2022-01-08 NOTE — Progress Notes (Signed)
Cardiology Office Note:    Date:  01/08/2022   ID:  Robert [name], DOB 06-09-1933, MRN [id number]  PCP:  Donnajean Lopes, MD  Cardiologist:  Jenne Campus, MD    Referring MD: Donnajean Lopes, MD   No chief complaint on file. Doing well  History of Present Illness:    Robert [name] is a 88 y.o. male with past medical history significant for multiple myeloma, initially diagnosed in 2016 due to bone marrow transplant did very well.  Also essential hypertension with component of whitecoat coat hypertension, chronic kidney failure.  He comes today to my office for follow-up, cardiac wise doing well described to have some shortness of breath with exertion.  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   Glaucoma of both eyes 10/10/2019   Goals of care, counseling/discussion 05/12/2018   Groin lump 12/28/2016   Hyperlipidemia    Hypertension    IgG multiple myeloma ([place]) 07/23/2004   Iron deficiency anemia 10/07/2018   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   Lytic bone lesions on xray 03/17/2016   Multiple myeloma ([place])    2006   Multiple myeloma in remission ([place]) 03/19/2010   Myeloma ([place]) 07/02/2011   S/P autologous bone marrow transplantation ([place]) 04/29/2005   Swelling of both lower extremities 10/18/2018   Syncope 12/24/2017   White coat syndrome with diagnosis of hypertension 11/28/2015    Past Surgical History:  Procedure Laterality Date   CATARACT EXTRACTION BILATERAL W/ ANTERIOR VITRECTOMY     IR IMAGING GUIDED PORT INSERTION   05/30/2018   LIMBAL STEM CELL TRANSPLANT      Current Medications: Current Meds  Medication Sig   acetaminophen-codeine (TYLENOL #3) 300-30 MG tablet Take 1 tablet by mouth every 8 (eight) hours as needed for moderate pain.   aspirin 81 MG tablet Take 81 mg by mouth daily.   bimatoprost (LUMIGAN) 0.01 % SOLN Place 1 drop into both eyes at bedtime.    Calcium Carbonate-Vit D-Min (CALTRATE 600+D PLUS PO) Take 1 tablet by mouth daily.   carvedilol (COREG) 25 MG tablet Take 25 mg by mouth 2 (two) times daily.   Cholecalciferol (VITAMIN D3) 1000 UNITS CAPS Take 1,000 Units by mouth daily.    ciclopirox (LOPROX) 0.77 % cream Apply 1 application topically 2 (two) times daily. As needed   cloNIDine (CATAPRES) 0.1 MG tablet Take 0.1 mg by mouth 2 (two) times daily as needed (Elevated BP).   cyanocobalamin 2000 MCG tablet Take 2,000 mcg by mouth at bedtime.    diclofenac Sodium (VOLTAREN) 1 % GEL Apply topically 4 (four) times daily. As needed   famciclovir (FAMVIR) 250 MG tablet TAKE 1 TABLET DAILY (Patient taking differently: Take 250 mg by mouth daily.)   losartan (COZAAR) 100 MG tablet Take 100 mg by mouth daily.   meloxicam (MOBIC) 15 MG tablet Take 1 tablet by mouth daily as needed.   metolazone (ZAROXOLYN) 5 MG tablet TAKE 1 TABLET 1 HOUR PRIOR TO TAKING LASIX DAILY   montelukast (SINGULAIR) 10 MG tablet TAKE  1 TABLET AT BEDTIME   Multiple Vitamin (MULITIVITAMIN WITH MINERALS) TABS Take 1 tablet by mouth daily. Unknown strenght   omeprazole (PRILOSEC) 20 MG capsule Take 1 capsule by mouth daily before breakfast.   Polyethyl Glycol-Propyl Glycol (SYSTANE OP) Place 1 drop into both eyes daily as needed (dry eyes).   potassium chloride SA (K-DUR,KLOR-CON) 20 MEQ tablet Take 20 mEq by mouth daily.   rosuvastatin (CRESTOR) 20 MG tablet Take 20 mg by mouth at bedtime.   [DISCONTINUED] furosemide (LASIX) 20 MG tablet Take 2 tablets (40 mg total) by mouth daily.     Allergies:   Patient has no known  allergies.   Social History   Socioeconomic History   Marital status: Married    Spouse name: Not on file   Number of children: Not on file   Years of education: Not on file   Highest education level: Not on file  Occupational History   Not on file  Tobacco Use   Smoking status: Former    Packs/day: 4.00    Years: 9.00    Total pack years: 36.00    Types: Cigars, Cigarettes    Start date: 07/08/1979    Quit date: 07/07/1988    Years since quitting: 33.5   Smokeless tobacco: Never   Tobacco comments:    quit 24 years ago  Vaping Use   Vaping Use: Never used  Substance and Sexual Activity   Alcohol use: Yes    Alcohol/week: 6.0 standard drinks of alcohol    Types: 6 Glasses of wine per week    Comment: very seldom   Drug use: No   Sexual activity: Not on file  Other Topics Concern   Not on file  Social History Narrative   Not on file   Social Determinants of Health   Financial Resource Strain: Not on file  Food Insecurity: Not on file  Transportation Needs: Not on file  Physical Activity: Not on file  Stress: Not on file  Social Connections: Not on file     Family History: The patient's family history includes Diabetes in his brother; Heart attack in his father; Heart disease in his father; Throat cancer in his mother. There is no history of Colon cancer. ROS:   Please see the history of present illness.    All 14 point review of systems negative except as described per history of present illness  EKGs/Labs/Other Studies Reviewed:      Recent Labs: 12/17/2021: ALT 14; BUN 22; Creatinine 1.29; Hemoglobin 11.2; Platelet Count 177; Potassium 3.7; Sodium 137  Recent Lipid Panel No results found for: "CHOL", "TRIG", "HDL", "CHOLHDL", "VLDL", "LDLCALC", "LDLDIRECT"  Physical Exam:    VS:  BP 112/70 (BP Location: Right Arm, Patient Position: Sitting, Cuff Size: Normal)   Pulse 78   Ht '5\' 11"'  (1.803 m)   Wt 218 lb (98.9 kg)   SpO2 97%   BMI 30.40 kg/m      Wt Readings from Last 3 Encounters:  01/08/22 218 lb (98.9 kg)  12/17/21 216 lb (98 kg)  11/17/21 213 lb 1.3 oz (96.7 kg)     GEN:  Well nourished, well developed in no acute distress HEENT: Normal NECK: No JVD; No carotid bruits LYMPHATICS: No lymphadenopathy CARDIAC: RRR, no murmurs, no rubs, no gallops RESPIRATORY:  Clear to auscultation without rales, wheezing or rhonchi  ABDOMEN: Soft, non-tender, non-distended MUSCULOSKELETAL:  No edema; No deformity  SKIN: Warm and dry LOWER EXTREMITIES: no swelling NEUROLOGIC:  Alert and  oriented x 3 PSYCHIATRIC:  Normal affect   ASSESSMENT:    1. Benign essential hypertension   2. IgG multiple myeloma (HCC)   3. Dyslipidemia    PLAN:    In order of problems listed above:  Benign essential hypertension seems to well controlled continue present management. Multiple myeloma follow-up excellently by oncology team Swelling of lower extremities he does take some diuretic on as-needed basis but admits that he does not very rarely. Dyslipidemia I did review his lab work test however the last number I have is from December with LDL of 134 HDL 58.  We will make arrangements for another fasting lipid profile to be done.  He is already on Crestor 20. Dyspnea on exertion.  Ask him to have an echocardiogram done   Medication Adjustments/Labs and Tests Ordered: Current medicines are reviewed at length with the patient today.  Concerns regarding medicines are outlined above.  No orders of the defined types were placed in this encounter.  Medication changes: No orders of the defined types were placed in this encounter.   Signed, Park Liter, MD, [facility] 01/08/2022 2:39 PM    [place]

## 2022-01-08 NOTE — Telephone Encounter (Signed)
Pt requested Lasix refill at appt today with Dr Agustin Cree

## 2022-01-09 ENCOUNTER — Other Ambulatory Visit: Payer: Self-pay

## 2022-01-12 ENCOUNTER — Other Ambulatory Visit: Payer: Self-pay | Admitting: Hematology & Oncology

## 2022-01-19 ENCOUNTER — Inpatient Hospital Stay (HOSPITAL_BASED_OUTPATIENT_CLINIC_OR_DEPARTMENT_OTHER): Payer: Medicare Other | Admitting: Family

## 2022-01-19 ENCOUNTER — Inpatient Hospital Stay: Payer: Medicare Other

## 2022-01-19 ENCOUNTER — Inpatient Hospital Stay: Payer: Medicare Other | Attending: Hematology & Oncology

## 2022-01-19 ENCOUNTER — Encounter: Payer: Self-pay | Admitting: Family

## 2022-01-19 VITALS — BP 140/81 | HR 52

## 2022-01-19 VITALS — BP 111/68 | HR 63 | Temp 97.9°F | Resp 18 | Wt 216.0 lb

## 2022-01-19 DIAGNOSIS — D631 Anemia in chronic kidney disease: Secondary | ICD-10-CM | POA: Diagnosis not present

## 2022-01-19 DIAGNOSIS — C9002 Multiple myeloma in relapse: Secondary | ICD-10-CM | POA: Insufficient documentation

## 2022-01-19 DIAGNOSIS — C9000 Multiple myeloma not having achieved remission: Secondary | ICD-10-CM

## 2022-01-19 DIAGNOSIS — Z79899 Other long term (current) drug therapy: Secondary | ICD-10-CM | POA: Diagnosis not present

## 2022-01-19 DIAGNOSIS — Z5112 Encounter for antineoplastic immunotherapy: Secondary | ICD-10-CM | POA: Insufficient documentation

## 2022-01-19 DIAGNOSIS — M899 Disorder of bone, unspecified: Secondary | ICD-10-CM

## 2022-01-19 DIAGNOSIS — N183 Chronic kidney disease, stage 3 unspecified: Secondary | ICD-10-CM

## 2022-01-19 LAB — CMP (CANCER CENTER ONLY)
ALT: 12 U/L (ref 0–44)
AST: 17 U/L (ref 15–41)
Albumin: 4.2 g/dL (ref 3.5–5.0)
Alkaline Phosphatase: 48 U/L (ref 38–126)
Anion gap: 8 (ref 5–15)
BUN: 20 mg/dL (ref 8–23)
CO2: 29 mmol/L (ref 22–32)
Calcium: 9.9 mg/dL (ref 8.9–10.3)
Chloride: 99 mmol/L (ref 98–111)
Creatinine: 1.33 mg/dL — ABNORMAL HIGH (ref 0.61–1.24)
GFR, Estimated: 52 mL/min — ABNORMAL LOW (ref >60)
Glucose, Bld: 112 mg/dL — ABNORMAL HIGH (ref 70–99)
Potassium: 3.5 mmol/L (ref 3.5–5.1)
Sodium: 136 mmol/L (ref 135–145)
Total Bilirubin: 0.8 mg/dL (ref 0.3–1.2)
Total Protein: 6.3 g/dL — ABNORMAL LOW (ref 6.5–8.1)

## 2022-01-19 LAB — CBC WITH DIFFERENTIAL (CANCER CENTER ONLY)
Abs Immature Granulocytes: 0.02 10*3/uL (ref 0.00–0.07)
Basophils Absolute: 0 10*3/uL (ref 0.0–0.1)
Basophils Relative: 1 %
Eosinophils Absolute: 0.1 10*3/uL (ref 0.0–0.5)
Eosinophils Relative: 2 %
HCT: 33.6 % — ABNORMAL LOW (ref 39.0–52.0)
Hemoglobin: 11.2 g/dL — ABNORMAL LOW (ref 13.0–17.0)
Immature Granulocytes: 0 %
Lymphocytes Relative: 41 %
Lymphs Abs: 2.5 10*3/uL (ref 0.7–4.0)
MCH: 31.7 pg (ref 26.0–34.0)
MCHC: 33.3 g/dL (ref 30.0–36.0)
MCV: 95.2 fL (ref 80.0–100.0)
Monocytes Absolute: 0.6 10*3/uL (ref 0.1–1.0)
Monocytes Relative: 10 %
Neutro Abs: 2.9 10*3/uL (ref 1.7–7.7)
Neutrophils Relative %: 46 %
Platelet Count: 181 10*3/uL (ref 150–400)
RBC: 3.53 MIL/uL — ABNORMAL LOW (ref 4.22–5.81)
RDW: 12.7 % (ref 11.5–15.5)
WBC Count: 6.1 10*3/uL (ref 4.0–10.5)
nRBC: 0 % (ref 0.0–0.2)

## 2022-01-19 LAB — LACTATE DEHYDROGENASE: LDH: 207 U/L — ABNORMAL HIGH (ref 98–192)

## 2022-01-19 MED ORDER — ACETAMINOPHEN 325 MG PO TABS
650.0000 mg | ORAL_TABLET | Freq: Once | ORAL | Status: AC
  Administered 2022-01-19: 650 mg via ORAL
  Filled 2022-01-19: qty 2

## 2022-01-19 MED ORDER — SODIUM CHLORIDE 0.9% FLUSH
10.0000 mL | INTRAVENOUS | Status: DC | PRN
  Administered 2022-01-19: 10 mL via INTRAVENOUS

## 2022-01-19 MED ORDER — SODIUM CHLORIDE 0.9 % IV SOLN: INTRAVENOUS | Status: DC

## 2022-01-19 MED ORDER — HEPARIN SOD (PORK) LOCK FLUSH 100 UNIT/ML IV SOLN
500.0000 [IU] | Freq: Once | INTRAVENOUS | Status: AC
  Administered 2022-01-19: 500 [IU] via INTRAVENOUS

## 2022-01-19 MED ORDER — DEXAMETHASONE 4 MG PO TABS
20.0000 mg | ORAL_TABLET | Freq: Once | ORAL | Status: AC
  Administered 2022-01-19: 20 mg via ORAL
  Filled 2022-01-19: qty 5

## 2022-01-19 MED ORDER — DIPHENHYDRAMINE HCL 25 MG PO CAPS
50.0000 mg | ORAL_CAPSULE | Freq: Once | ORAL | Status: AC
  Administered 2022-01-19: 50 mg via ORAL
  Filled 2022-01-19: qty 2

## 2022-01-19 MED ORDER — ZOLEDRONIC ACID 4 MG/100ML IV SOLN
4.0000 mg | Freq: Once | INTRAVENOUS | Status: AC
  Administered 2022-01-19: 4 mg via INTRAVENOUS
  Filled 2022-01-19: qty 100

## 2022-01-19 MED ORDER — DARATUMUMAB-HYALURONIDASE-FIHJ 1800-30000 MG-UT/15ML ~~LOC~~ SOLN
1800.0000 mg | Freq: Once | SUBCUTANEOUS | Status: AC
  Administered 2022-01-19: 1800 mg via SUBCUTANEOUS
  Filled 2022-01-19: qty 15

## 2022-01-19 NOTE — Patient Instructions (Signed)

## 2022-01-19 NOTE — Progress Notes (Signed)
Hematology and Oncology Follow Up Visit  Robert [name] [id number] 11-23-1933 88 y.o. 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 is here today for follow-up and treatment. He is doing well but still has pain in the right knee due to arthritis. He states that he is waiting on his insurance to approve gel injection with ortho.  Last month no M-spike detected, IgG level 495 mg/dL and kappa light chains 1.25 mg/dL.  No fever, chills, n/v, cough, rash, dizziness, SOB, chest pain, palpitations, abdominal pain or changes in bowel or bladder habits.  No swelling, tenderness, numbness or tingling in his extremities at this time.  No falls or syncope reported.  Appetite and hydration are good. Weight is stable at 216 lbs.   ECOG Performance Status: 1 - Symptomatic but completely ambulatory  Medications:  Allergies as of 01/19/2022   No Known Allergies      Medication List        Accurate as of January 19, 2022  8:59 AM. If you have any questions, ask your nurse or doctor.          acetaminophen-codeine 300-30 MG tablet Commonly known as: TYLENOL #3 Take 1 tablet by mouth every 8 (eight) hours as needed for moderate pain.   aspirin 81 MG tablet Take 81 mg by mouth daily.   bimatoprost 0.01 % Soln Commonly known as: LUMIGAN Place 1 drop into both eyes at bedtime.   CALTRATE 600+D PLUS PO Take 1 tablet by mouth daily.   carvedilol 25 MG tablet Commonly known as: COREG Take 25 mg by mouth 2 (two) times daily.    ciclopirox 0.77 % cream Commonly known as: LOPROX Apply 1 application topically 2 (two) times daily. As needed   cloNIDine 0.1 MG tablet Commonly known as: CATAPRES Take 0.1 mg by mouth 2 (two) times daily as needed (Elevated BP).   cyanocobalamin 2000 MCG tablet Take 2,000 mcg by mouth at bedtime.   diclofenac Sodium 1 % Gel Commonly known as: VOLTAREN Apply topically 4 (four) times daily. As needed   famciclovir 250 MG tablet Commonly known as: FAMVIR TAKE 1 TABLET DAILY   furosemide 20 MG tablet Commonly known as: LASIX Take 2 tablets (40 mg total) by mouth daily.   losartan 100 MG tablet Commonly known as: COZAAR Take 100 mg by mouth daily.   meloxicam 15 MG tablet Commonly known as: MOBIC Take 1 tablet by mouth daily as needed.   metolazone 5 MG tablet Commonly known as: ZAROXOLYN TAKE 1 TABLET 1 HOUR PRIOR TO TAKING LASIX DAILY   montelukast 10 MG tablet Commonly known as: SINGULAIR TAKE 1 TABLET AT BEDTIME   multivitamin with minerals Tabs tablet Take 1 tablet by mouth daily. Unknown strenght   omeprazole 20 MG capsule Commonly known as: PRILOSEC Take 1 capsule by mouth daily before breakfast.   potassium chloride SA 20 MEQ tablet Commonly known as: KLOR-CON M Take 20 mEq by  mouth daily.   rosuvastatin 20 MG tablet Commonly known as: CRESTOR Take 20 mg by mouth at bedtime.   SYSTANE OP Place 1 drop into both eyes daily as needed (dry eyes).   Vitamin D3 25 MCG (1000 UT) Caps Take 1,000 Units by mouth daily.        Allergies: No Known Allergies  Past Medical History, Surgical history, Social history, and Family History were reviewed and updated.  Review of Systems: All other 10 point review of systems is negative.   Physical Exam:  weight is 216 lb 0.6 oz (98 kg). His oral temperature is 97.9 F (36.6 C). His blood pressure is 111/68 and his pulse is 63. His respiration is 18 and oxygen saturation is 100%.   Wt Readings from Last 3  Encounters:  01/19/22 216 lb 0.6 oz (98 kg)  01/08/22 218 lb (98.9 kg)  12/17/21 216 lb (98 kg)    Ocular: Sclerae unicteric, pupils equal, round and reactive to light Ear-nose-throat: Oropharynx clear, dentition fair Lymphatic: No cervical or supraclavicular adenopathy Lungs no rales or rhonchi, good excursion bilaterally Heart regular rate and rhythm, no murmur appreciated Abd soft, nontender, positive bowel sounds MSK no focal spinal tenderness, no joint edema Neuro: non-focal, well-oriented, appropriate affect Breasts: Deferred   Lab Results  Component Value Date   WBC 6.1 01/19/2022   HGB 11.2 (L) 01/19/2022   HCT 33.6 (L) 01/19/2022   MCV 95.2 01/19/2022   PLT 181 01/19/2022   Lab Results  Component Value Date   FERRITIN 555 (H) 07/23/2021   IRON 84 07/23/2021   TIBC 325 07/23/2021   UIBC 241 07/23/2021   IRONPCTSAT 26 07/23/2021   Lab Results  Component Value Date   RETICCTPCT 2.2 04/05/2019   RBC 3.53 (L) 01/19/2022   Lab Results  Component Value Date   KPAFRELGTCHN 12.5 12/17/2021   LAMBDASER 4.7 (L) 12/17/2021   KAPLAMBRATIO 2.66 (H) 12/17/2021   Lab Results  Component Value Date   IGGSERUM 495 (L) 12/17/2021   IGA 23 (L) 12/17/2021   IGMSERUM 38 12/17/2021   Lab Results  Component Value Date   TOTALPROTELP 6.0 12/17/2021   ALBUMINELP 3.6 12/17/2021   A1GS 0.2 12/17/2021   A2GS 0.9 12/17/2021   BETS 0.8 12/17/2021   BETA2SER 0.3 01/03/2015   GAMS 0.5 12/17/2021   MSPIKE Not Observed 12/17/2021   SPEI Comment 12/17/2021     Chemistry      Component Value Date/Time   NA 136 01/19/2022 0758   NA 137 04/08/2017 1141   NA 138 10/29/2015 1029   K 3.5 01/19/2022 0758   K 3.4 04/08/2017 1141   K 3.9 10/29/2015 1029   CL 99 01/19/2022 0758   CL 102 04/08/2017 1141   CO2 29 01/19/2022 0758   CO2 30 04/08/2017 1141   CO2 27 10/29/2015 1029   BUN 20 01/19/2022 0758   BUN 16 04/08/2017 1141   BUN 13.8 10/29/2015 1029   CREATININE 1.33 (H)  01/19/2022 0758   CREATININE 1.3 (H) 04/08/2017 1141   CREATININE 1.1 10/29/2015 1029      Component Value Date/Time   CALCIUM 9.9 01/19/2022 0758   CALCIUM 9.3 04/08/2017 1141   CALCIUM 9.3 10/29/2015 1029   ALKPHOS 48 01/19/2022 0758   ALKPHOS 66 04/08/2017 1141   ALKPHOS 52 10/29/2015 1029   AST 17 01/19/2022 0758   AST 20 10/29/2015 1029   ALT 12 01/19/2022 0758   ALT 21 04/08/2017 1141   ALT 14  10/29/2015 1029   BILITOT 0.8 01/19/2022 0758   BILITOT 0.75 10/29/2015 1029       Impression and Plan: Mr. Mauss is a very pleasant 88 yo African American gentleman with relapsed IgG kappa myeloma. He has history of stem cell transplant in 2006 and since relapsed.   We will proceed with treatment today as planned.  No ESA, Hgb 11.2.  Follow-up in 1 month.   Lottie Dawson, NP 8/28/20238:59 AM

## 2022-01-20 ENCOUNTER — Telehealth: Payer: Self-pay | Admitting: *Deleted

## 2022-01-20 ENCOUNTER — Other Ambulatory Visit: Payer: Self-pay | Admitting: Family

## 2022-01-20 LAB — IGG, IGA, IGM
IgA: 27 mg/dL — ABNORMAL LOW (ref 61–437)
IgG (Immunoglobin G), Serum: 504 mg/dL — ABNORMAL LOW (ref 603–1613)
IgM (Immunoglobulin M), Srm: 43 mg/dL (ref 15–143)

## 2022-01-20 LAB — KAPPA/LAMBDA LIGHT CHAINS
Kappa free light chain: 13.2 mg/L (ref 3.3–19.4)
Kappa, lambda light chain ratio: 1.71 — ABNORMAL HIGH (ref 0.26–1.65)
Lambda free light chains: 7.7 mg/L (ref 5.7–26.3)

## 2022-01-20 NOTE — Telephone Encounter (Signed)
Per 01/19/22 los - called and lvm of upcoming appointments - requested callback to confirm 

## 2022-01-22 LAB — PROTEIN ELECTROPHORESIS, SERUM
A/G Ratio: 1.6 (ref 0.7–1.7)
Albumin ELP: 3.6 g/dL (ref 2.9–4.4)
Alpha-1-Globulin: 0.2 g/dL (ref 0.0–0.4)
Alpha-2-Globulin: 0.7 g/dL (ref 0.4–1.0)
Beta Globulin: 0.9 g/dL (ref 0.7–1.3)
Gamma Globulin: 0.4 g/dL (ref 0.4–1.8)
Globulin, Total: 2.2 g/dL (ref 2.2–3.9)
Total Protein ELP: 5.8 g/dL — ABNORMAL LOW (ref 6.0–8.5)

## 2022-02-06 ENCOUNTER — Telehealth: Payer: Self-pay

## 2022-02-06 DIAGNOSIS — M1711 Unilateral primary osteoarthritis, right knee: Secondary | ICD-10-CM

## 2022-02-06 NOTE — Telephone Encounter (Signed)
Per Robert Polimeni B., patient will call back after he finishes his current medication if symptoms return and when he is ready to proceed with gel injection.

## 2022-02-18 ENCOUNTER — Encounter: Payer: Self-pay | Admitting: Family

## 2022-02-18 ENCOUNTER — Inpatient Hospital Stay: Payer: Medicare Other

## 2022-02-18 ENCOUNTER — Inpatient Hospital Stay: Payer: Medicare Other | Attending: Hematology & Oncology

## 2022-02-18 ENCOUNTER — Inpatient Hospital Stay (HOSPITAL_BASED_OUTPATIENT_CLINIC_OR_DEPARTMENT_OTHER): Payer: Medicare Other | Admitting: Family

## 2022-02-18 VITALS — BP 143/81 | HR 64 | Temp 97.6°F | Resp 18 | Wt 214.0 lb

## 2022-02-18 VITALS — BP 154/64

## 2022-02-18 DIAGNOSIS — D631 Anemia in chronic kidney disease: Secondary | ICD-10-CM | POA: Diagnosis not present

## 2022-02-18 DIAGNOSIS — Z79899 Other long term (current) drug therapy: Secondary | ICD-10-CM | POA: Insufficient documentation

## 2022-02-18 DIAGNOSIS — Z5112 Encounter for antineoplastic immunotherapy: Secondary | ICD-10-CM | POA: Diagnosis present

## 2022-02-18 DIAGNOSIS — C9002 Multiple myeloma in relapse: Secondary | ICD-10-CM | POA: Diagnosis present

## 2022-02-18 DIAGNOSIS — N183 Chronic kidney disease, stage 3 unspecified: Secondary | ICD-10-CM | POA: Diagnosis not present

## 2022-02-18 DIAGNOSIS — D630 Anemia in neoplastic disease: Secondary | ICD-10-CM | POA: Insufficient documentation

## 2022-02-18 DIAGNOSIS — C9000 Multiple myeloma not having achieved remission: Secondary | ICD-10-CM

## 2022-02-18 LAB — CBC WITH DIFFERENTIAL (CANCER CENTER ONLY)
Abs Immature Granulocytes: 0.03 10*3/uL (ref 0.00–0.07)
Basophils Absolute: 0 10*3/uL (ref 0.0–0.1)
Basophils Relative: 1 %
Eosinophils Absolute: 0.1 10*3/uL (ref 0.0–0.5)
Eosinophils Relative: 2 %
HCT: 33.2 % — ABNORMAL LOW (ref 39.0–52.0)
Hemoglobin: 11.1 g/dL — ABNORMAL LOW (ref 13.0–17.0)
Immature Granulocytes: 0 %
Lymphocytes Relative: 31 %
Lymphs Abs: 2.2 10*3/uL (ref 0.7–4.0)
MCH: 31.9 pg (ref 26.0–34.0)
MCHC: 33.4 g/dL (ref 30.0–36.0)
MCV: 95.4 fL (ref 80.0–100.0)
Monocytes Absolute: 0.7 10*3/uL (ref 0.1–1.0)
Monocytes Relative: 10 %
Neutro Abs: 3.9 10*3/uL (ref 1.7–7.7)
Neutrophils Relative %: 56 %
Platelet Count: 194 10*3/uL (ref 150–400)
RBC: 3.48 MIL/uL — ABNORMAL LOW (ref 4.22–5.81)
RDW: 12.9 % (ref 11.5–15.5)
WBC Count: 6.9 10*3/uL (ref 4.0–10.5)
nRBC: 0 % (ref 0.0–0.2)

## 2022-02-18 LAB — CMP (CANCER CENTER ONLY)
ALT: 10 U/L (ref 0–44)
AST: 14 U/L — ABNORMAL LOW (ref 15–41)
Albumin: 4.3 g/dL (ref 3.5–5.0)
Alkaline Phosphatase: 48 U/L (ref 38–126)
Anion gap: 9 (ref 5–15)
BUN: 24 mg/dL — ABNORMAL HIGH (ref 8–23)
CO2: 28 mmol/L (ref 22–32)
Calcium: 10.2 mg/dL (ref 8.9–10.3)
Chloride: 102 mmol/L (ref 98–111)
Creatinine: 1.28 mg/dL — ABNORMAL HIGH (ref 0.61–1.24)
GFR, Estimated: 55 mL/min — ABNORMAL LOW (ref >60)
Glucose, Bld: 121 mg/dL — ABNORMAL HIGH (ref 70–99)
Potassium: 3.5 mmol/L (ref 3.5–5.1)
Sodium: 139 mmol/L (ref 135–145)
Total Bilirubin: 1 mg/dL (ref 0.3–1.2)
Total Protein: 6.4 g/dL — ABNORMAL LOW (ref 6.5–8.1)

## 2022-02-18 LAB — LACTATE DEHYDROGENASE: LDH: 246 U/L — ABNORMAL HIGH (ref 98–192)

## 2022-02-18 MED ORDER — DARATUMUMAB-HYALURONIDASE-FIHJ 1800-30000 MG-UT/15ML ~~LOC~~ SOLN
1800.0000 mg | Freq: Once | SUBCUTANEOUS | Status: AC
  Administered 2022-02-18: 1800 mg via SUBCUTANEOUS
  Filled 2022-02-18: qty 15

## 2022-02-18 MED ORDER — ACETAMINOPHEN 325 MG PO TABS
650.0000 mg | ORAL_TABLET | Freq: Once | ORAL | Status: AC
  Administered 2022-02-18: 650 mg via ORAL
  Filled 2022-02-18: qty 2

## 2022-02-18 MED ORDER — SODIUM CHLORIDE 0.9% FLUSH
10.0000 mL | INTRAVENOUS | Status: DC | PRN
  Administered 2022-02-18: 10 mL

## 2022-02-18 MED ORDER — HEPARIN SOD (PORK) LOCK FLUSH 100 UNIT/ML IV SOLN
500.0000 [IU] | Freq: Once | INTRAVENOUS | Status: AC | PRN
  Administered 2022-02-18: 500 [IU]

## 2022-02-18 MED ORDER — DEXAMETHASONE 4 MG PO TABS
20.0000 mg | ORAL_TABLET | Freq: Once | ORAL | Status: AC
  Administered 2022-02-18: 20 mg via ORAL
  Filled 2022-02-18: qty 5

## 2022-02-18 MED ORDER — DIPHENHYDRAMINE HCL 25 MG PO CAPS
50.0000 mg | ORAL_CAPSULE | Freq: Once | ORAL | Status: AC
  Administered 2022-02-18: 50 mg via ORAL
  Filled 2022-02-18: qty 2

## 2022-02-18 MED ORDER — SODIUM CHLORIDE 0.9 % IV SOLN: Freq: Once | INTRAVENOUS | Status: DC

## 2022-02-18 NOTE — Patient Instructions (Signed)

## 2022-02-18 NOTE — Progress Notes (Signed)
Hematology and Oncology Follow Up Visit  SNEIJDER BERNARDS [id number] 1933/12/17 88 y.o. 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 Bart is here today for follow-up. He is doing well but is still having left knee pain with some mild swelling. He states that he was approved to get a gel injection in that knee and is following up with ortho.  No falls or syncope reported.  Last month, no M-spike was detected, IgG level was 504 mg/dL and kappa light chains 1.32 mg/dL.  No blood loss noted. No bruising or petechiae.  No fever, chills, n/v, cough, rash, dizziness, SOB, chest pain, palpitations, abdominal pain or changes in bowel or bladder habits.  He has intermittent numbness and tingling in his right hand that comes and goes.  Appetite is good. He admits that he needs to drink more water throughout the day. His weight is stable at 214 lbs.   ECOG Performance Status: 1 - Symptomatic but completely ambulatory  Medications:  Allergies as of 02/18/2022   No Known Allergies      Medication List        Accurate as of February 18, 2022 10:12 AM. If you have any questions, ask your nurse or doctor.          acetaminophen-codeine 300-30 MG tablet Commonly known as: TYLENOL #3 Take 1 tablet by mouth every 8 (eight) hours as needed for moderate pain.   aspirin 81 MG tablet Take 81 mg by mouth daily.   bimatoprost 0.01 % Soln Commonly known as: LUMIGAN Place 1 drop into both eyes at bedtime.   CALTRATE 600+D PLUS PO Take 1  tablet by mouth daily.   carvedilol 25 MG tablet Commonly known as: COREG Take 25 mg by mouth 2 (two) times daily.   ciclopirox 0.77 % cream Commonly known as: LOPROX Apply 1 application topically 2 (two) times daily. As needed   cloNIDine 0.1 MG tablet Commonly known as: CATAPRES Take 0.1 mg by mouth 2 (two) times daily as needed (Elevated BP).   cyanocobalamin 2000 MCG tablet Take 2,000 mcg by mouth at bedtime.   diclofenac Sodium 1 % Gel Commonly known as: VOLTAREN Apply topically 4 (four) times daily. As needed   famciclovir 250 MG tablet Commonly known as: FAMVIR TAKE 1 TABLET DAILY   furosemide 20 MG tablet Commonly known as: LASIX Take 2 tablets (40 mg total) by mouth daily.   losartan 100 MG tablet Commonly known as: COZAAR Take 100 mg by mouth daily.   meloxicam 15 MG tablet Commonly known as: MOBIC Take 1 tablet by mouth daily as needed.   metolazone 5 MG tablet Commonly known as: ZAROXOLYN TAKE 1 TABLET 1 HOUR PRIOR TO TAKING LASIX DAILY   montelukast 10 MG tablet Commonly known as: SINGULAIR TAKE 1 TABLET AT BEDTIME   multivitamin with minerals Tabs tablet Take 1 tablet by mouth daily. Unknown strenght   omeprazole 20 MG capsule Commonly known as:  PRILOSEC Take 1 capsule by mouth daily before breakfast.   potassium chloride SA 20 MEQ tablet Commonly known as: KLOR-CON M Take 20 mEq by mouth daily.   rosuvastatin 20 MG tablet Commonly known as: CRESTOR Take 20 mg by mouth at bedtime.   SYSTANE OP Place 1 drop into both eyes daily as needed (dry eyes).   Vitamin D3 25 MCG (1000 UT) Caps Take 1,000 Units by mouth daily.        Allergies: No Known Allergies  Past Medical History, Surgical history, Social history, and Family History were reviewed and updated.  Review of Systems: All other 10 point review of systems is negative.   Physical Exam:  weight is 214 lb (97.1 kg). His oral temperature is 97.6 F (36.4 C). His blood  pressure is 143/81 (abnormal) and his pulse is 64. His respiration is 18 and oxygen saturation is 100%.   Wt Readings from Last 3 Encounters:  02/18/22 214 lb (97.1 kg)  01/19/22 216 lb 0.6 oz (98 kg)  01/08/22 218 lb (98.9 kg)    Ocular: Sclerae unicteric, pupils equal, round and reactive to light Ear-nose-throat: Oropharynx clear, dentition fair Lymphatic: No cervical or supraclavicular adenopathy Lungs no rales or rhonchi, good excursion bilaterally Heart regular rate and rhythm, no murmur appreciated Abd soft, nontender, positive bowel sounds MSK no focal spinal tenderness, no joint edema Neuro: non-focal, well-oriented, appropriate affect Breasts: Deferred   Lab Results  Component Value Date   WBC 6.9 02/18/2022   HGB 11.1 (L) 02/18/2022   HCT 33.2 (L) 02/18/2022   MCV 95.4 02/18/2022   PLT 194 02/18/2022   Lab Results  Component Value Date   FERRITIN 555 (H) 07/23/2021   IRON 84 07/23/2021   TIBC 325 07/23/2021   UIBC 241 07/23/2021   IRONPCTSAT 26 07/23/2021   Lab Results  Component Value Date   RETICCTPCT 2.2 04/05/2019   RBC 3.48 (L) 02/18/2022   Lab Results  Component Value Date   KPAFRELGTCHN 13.2 01/19/2022   LAMBDASER 7.7 01/19/2022   KAPLAMBRATIO 1.71 (H) 01/19/2022   Lab Results  Component Value Date   IGGSERUM 504 (L) 01/19/2022   IGA 27 (L) 01/19/2022   IGMSERUM 43 01/19/2022   Lab Results  Component Value Date   TOTALPROTELP 5.8 (L) 01/19/2022   ALBUMINELP 3.6 01/19/2022   A1GS 0.2 01/19/2022   A2GS 0.7 01/19/2022   BETS 0.9 01/19/2022   BETA2SER 0.3 01/03/2015   GAMS 0.4 01/19/2022   MSPIKE Not Observed 01/19/2022   SPEI Comment 01/19/2022     Chemistry      Component Value Date/Time   NA 136 01/19/2022 0758   NA 137 04/08/2017 1141   NA 138 10/29/2015 1029   K 3.5 01/19/2022 0758   K 3.4 04/08/2017 1141   K 3.9 10/29/2015 1029   CL 99 01/19/2022 0758   CL 102 04/08/2017 1141   CO2 29 01/19/2022 0758   CO2 30 04/08/2017  1141   CO2 27 10/29/2015 1029   BUN 20 01/19/2022 0758   BUN 16 04/08/2017 1141   BUN 13.8 10/29/2015 1029   CREATININE 1.33 (H) 01/19/2022 0758   CREATININE 1.3 (H) 04/08/2017 1141   CREATININE 1.1 10/29/2015 1029      Component Value Date/Time   CALCIUM 9.9 01/19/2022 0758   CALCIUM 9.3 04/08/2017 1141   CALCIUM 9.3 10/29/2015 1029   ALKPHOS 48 01/19/2022 0758   ALKPHOS 66 04/08/2017 1141   ALKPHOS 52 10/29/2015 1029   AST  17 01/19/2022 0758   AST 20 10/29/2015 1029   ALT 12 01/19/2022 0758   ALT 21 04/08/2017 1141   ALT 14 10/29/2015 1029   BILITOT 0.8 01/19/2022 0758   BILITOT 0.75 10/29/2015 1029       Impression and Plan: Mr. Mccaughey is a very pleasant 88 yo African American gentleman with relapsed IgG kappa myeloma. He has history of stem cell transplant in 2006 and since relapsed.   He continues to tolerate Daratumumab nicely.  We will proceed with treatment as planned.  Protein studies are pending.  Follow-up in 1 month.   Lottie Dawson, NP 9/27/202310:12 AM

## 2022-02-18 NOTE — Addendum Note (Signed)
Addended by: Burney Gauze R on: 02/18/2022 10:50 AM   Modules accepted: Orders

## 2022-02-19 LAB — KAPPA/LAMBDA LIGHT CHAINS
Kappa free light chain: 15.4 mg/L (ref 3.3–19.4)
Kappa, lambda light chain ratio: 1.86 — ABNORMAL HIGH (ref 0.26–1.65)
Lambda free light chains: 8.3 mg/L (ref 5.7–26.3)

## 2022-02-20 LAB — IGG, IGA, IGM
IgA: 30 mg/dL — ABNORMAL LOW (ref 61–437)
IgG (Immunoglobin G), Serum: 520 mg/dL — ABNORMAL LOW (ref 603–1613)
IgM (Immunoglobulin M), Srm: 39 mg/dL (ref 15–143)

## 2022-02-20 LAB — PROTEIN ELECTROPHORESIS, SERUM
A/G Ratio: 1.4 (ref 0.7–1.7)
Albumin ELP: 3.6 g/dL (ref 2.9–4.4)
Alpha-1-Globulin: 0.2 g/dL (ref 0.0–0.4)
Alpha-2-Globulin: 0.9 g/dL (ref 0.4–1.0)
Beta Globulin: 0.8 g/dL (ref 0.7–1.3)
Gamma Globulin: 0.5 g/dL (ref 0.4–1.8)
Globulin, Total: 2.5 g/dL (ref 2.2–3.9)
Total Protein ELP: 6.1 g/dL (ref 6.0–8.5)

## 2022-03-20 ENCOUNTER — Inpatient Hospital Stay: Payer: Medicare Other

## 2022-03-20 ENCOUNTER — Inpatient Hospital Stay (HOSPITAL_BASED_OUTPATIENT_CLINIC_OR_DEPARTMENT_OTHER): Payer: Medicare Other | Admitting: Family

## 2022-03-20 ENCOUNTER — Inpatient Hospital Stay: Payer: Medicare Other | Attending: Hematology & Oncology

## 2022-03-20 VITALS — BP 120/64 | HR 62 | Temp 97.9°F | Resp 19 | Ht 71.0 in | Wt 213.0 lb

## 2022-03-20 VITALS — BP 153/66 | HR 66

## 2022-03-20 DIAGNOSIS — D630 Anemia in neoplastic disease: Secondary | ICD-10-CM | POA: Insufficient documentation

## 2022-03-20 DIAGNOSIS — C9002 Multiple myeloma in relapse: Secondary | ICD-10-CM | POA: Diagnosis present

## 2022-03-20 DIAGNOSIS — Z79899 Other long term (current) drug therapy: Secondary | ICD-10-CM | POA: Diagnosis not present

## 2022-03-20 DIAGNOSIS — C9000 Multiple myeloma not having achieved remission: Secondary | ICD-10-CM

## 2022-03-20 DIAGNOSIS — Z5112 Encounter for antineoplastic immunotherapy: Secondary | ICD-10-CM | POA: Insufficient documentation

## 2022-03-20 DIAGNOSIS — D631 Anemia in chronic kidney disease: Secondary | ICD-10-CM

## 2022-03-20 DIAGNOSIS — N183 Chronic kidney disease, stage 3 unspecified: Secondary | ICD-10-CM

## 2022-03-20 LAB — CMP (CANCER CENTER ONLY)
ALT: 9 U/L (ref 0–44)
AST: 14 U/L — ABNORMAL LOW (ref 15–41)
Albumin: 4.1 g/dL (ref 3.5–5.0)
Alkaline Phosphatase: 42 U/L (ref 38–126)
Anion gap: 8 (ref 5–15)
BUN: 19 mg/dL (ref 8–23)
CO2: 29 mmol/L (ref 22–32)
Calcium: 10.2 mg/dL (ref 8.9–10.3)
Chloride: 101 mmol/L (ref 98–111)
Creatinine: 1.23 mg/dL (ref 0.61–1.24)
GFR, Estimated: 57 mL/min — ABNORMAL LOW (ref >60)
Glucose, Bld: 109 mg/dL — ABNORMAL HIGH (ref 70–99)
Potassium: 3.4 mmol/L — ABNORMAL LOW (ref 3.5–5.1)
Sodium: 138 mmol/L (ref 135–145)
Total Bilirubin: 1 mg/dL (ref 0.3–1.2)
Total Protein: 5.8 g/dL — ABNORMAL LOW (ref 6.5–8.1)

## 2022-03-20 LAB — CBC WITH DIFFERENTIAL (CANCER CENTER ONLY)
Abs Immature Granulocytes: 0.02 10*3/uL (ref 0.00–0.07)
Basophils Absolute: 0 10*3/uL (ref 0.0–0.1)
Basophils Relative: 1 %
Eosinophils Absolute: 0.1 10*3/uL (ref 0.0–0.5)
Eosinophils Relative: 2 %
HCT: 32.1 % — ABNORMAL LOW (ref 39.0–52.0)
Hemoglobin: 10.7 g/dL — ABNORMAL LOW (ref 13.0–17.0)
Immature Granulocytes: 0 %
Lymphocytes Relative: 37 %
Lymphs Abs: 2.4 10*3/uL (ref 0.7–4.0)
MCH: 31.8 pg (ref 26.0–34.0)
MCHC: 33.3 g/dL (ref 30.0–36.0)
MCV: 95.3 fL (ref 80.0–100.0)
Monocytes Absolute: 0.7 10*3/uL (ref 0.1–1.0)
Monocytes Relative: 10 %
Neutro Abs: 3.2 10*3/uL (ref 1.7–7.7)
Neutrophils Relative %: 50 %
Platelet Count: 197 10*3/uL (ref 150–400)
RBC: 3.37 MIL/uL — ABNORMAL LOW (ref 4.22–5.81)
RDW: 12.6 % (ref 11.5–15.5)
WBC Count: 6.4 10*3/uL (ref 4.0–10.5)
nRBC: 0 % (ref 0.0–0.2)

## 2022-03-20 LAB — LACTATE DEHYDROGENASE: LDH: 185 U/L (ref 98–192)

## 2022-03-20 MED ORDER — SODIUM CHLORIDE 0.9% FLUSH
10.0000 mL | INTRAVENOUS | Status: DC | PRN
  Administered 2022-03-20: 10 mL

## 2022-03-20 MED ORDER — ACETAMINOPHEN 325 MG PO TABS
650.0000 mg | ORAL_TABLET | Freq: Once | ORAL | Status: AC
  Administered 2022-03-20: 650 mg via ORAL
  Filled 2022-03-20: qty 2

## 2022-03-20 MED ORDER — DARATUMUMAB-HYALURONIDASE-FIHJ 1800-30000 MG-UT/15ML ~~LOC~~ SOLN
1800.0000 mg | Freq: Once | SUBCUTANEOUS | Status: AC
  Administered 2022-03-20: 1800 mg via SUBCUTANEOUS
  Filled 2022-03-20: qty 15

## 2022-03-20 MED ORDER — DIPHENHYDRAMINE HCL 25 MG PO CAPS
50.0000 mg | ORAL_CAPSULE | Freq: Once | ORAL | Status: AC
  Administered 2022-03-20: 50 mg via ORAL
  Filled 2022-03-20: qty 2

## 2022-03-20 MED ORDER — DEXAMETHASONE 4 MG PO TABS
20.0000 mg | ORAL_TABLET | Freq: Once | ORAL | Status: AC
  Administered 2022-03-20: 20 mg via ORAL
  Filled 2022-03-20: qty 5

## 2022-03-20 MED ORDER — HEPARIN SOD (PORK) LOCK FLUSH 100 UNIT/ML IV SOLN
500.0000 [IU] | Freq: Once | INTRAVENOUS | Status: AC | PRN
  Administered 2022-03-20: 500 [IU]

## 2022-03-20 NOTE — Patient Instructions (Signed)
[facility] [place]  Discharge Instructions: Thank you for choosing [facility] to provide your oncology and hematology care.   If you have a lab appointment with the [facility], please go directly to the [facility] and check in at the registration area.  Wear comfortable clothing and clothing appropriate for easy access to any Portacath or PICC line.   We strive to give you quality time with your provider. You may need to reschedule your appointment if you arrive late (15 or more minutes).  Arriving late affects you and other patients whose appointments are after yours.  Also, if you miss three or more appointments without notifying the office, you may be dismissed from the clinic at the provider's discretion.      For prescription refill requests, have your pharmacy contact our office and allow 72 hours for refills to be completed.    Today you received the following chemotherapy and/or immunotherapy agents Darzarlex      To help prevent nausea and vomiting after your treatment, we encourage you to take your nausea medication as directed.  BELOW ARE SYMPTOMS THAT SHOULD BE REPORTED IMMEDIATELY: *FEVER GREATER THAN 100.4 F (38 C) OR HIGHER *CHILLS OR SWEATING *NAUSEA AND VOMITING THAT IS NOT CONTROLLED WITH YOUR NAUSEA MEDICATION *UNUSUAL SHORTNESS OF BREATH *UNUSUAL BRUISING OR BLEEDING *URINARY PROBLEMS (pain or burning when urinating, or frequent urination) *BOWEL PROBLEMS (unusual diarrhea, constipation, pain near the anus) TENDERNESS IN MOUTH AND THROAT WITH OR WITHOUT PRESENCE OF ULCERS (sore throat, sores in mouth, or a toothache) UNUSUAL RASH, SWELLING OR PAIN  UNUSUAL VAGINAL DISCHARGE OR ITCHING   Items with * indicate a potential emergency and should be followed up as soon as possible or go to the Emergency Department if any problems should occur.  Please show the CHEMOTHERAPY ALERT CARD or IMMUNOTHERAPY ALERT CARD at check-in to the  Emergency Department and triage nurse. Should you have questions after your visit or need to cancel or reschedule your appointment, please contact [facility]  [phone] and follow the prompts.  Office hours are 8:00 a.m. to 4:30 p.m. Monday - Friday. Please note that voicemails left after 4:00 p.m. may not be returned until the following business day.  We are closed weekends and major holidays. You have access to a nurse at all times for urgent questions. Please call the main number to the clinic [phone] and follow the prompts.  For any non-urgent questions, you may also contact your provider using MyChart. We now offer e-Visits for anyone 29 and older to request care online for non-urgent symptoms. For details visit [name].   Also download the MyChart app! Go to the app store, search "MyChart", open the app, select [facility], and log in with your MyChart username and password.  Masks are optional in the cancer centers. If you would like for your care team to wear a mask while they are taking care of you, please let them know. You may have one support person who is at least 86 years old accompany you for your appointments.

## 2022-03-20 NOTE — Patient Instructions (Signed)

## 2022-03-20 NOTE — Progress Notes (Signed)
Hematology and Oncology Follow Up Visit  Robert [name] [id number] 1933-06-19 88 y.o. 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 He is doing well but states that he has some fluid retention at times in his legs. This improves over night and he is taking his lasix daily as prescribed.  Pedal pulses are 2+. Minimal swelling in the left ankle and foot.  He has intermittent numbness and tingling in the fingers of his right hands.  No falls or syncope reported.  No fever, chills, n/v, cough, rash, dizziness, SOB, chest pain, palpitations, abdominal pain or changes in bowel or bladder habits.  No blood loss, bruising or petechiae.  Appetite and hydration are good. Weight is stable at 213 lbs.   ECOG Performance Status: 1 - Symptomatic but completely ambulatory  Medications:  Allergies as of 03/20/2022   No Known Allergies      Medication List        Accurate as of March 20, 2022 11:06 AM. If you have any questions, ask your nurse or doctor.          acetaminophen-codeine 300-30 MG tablet Commonly known as: TYLENOL #3 Take 1 tablet by mouth every 8 (eight) hours as needed for moderate pain.   aspirin 81 MG tablet Take 81 mg by mouth daily.   bimatoprost 0.01 % Soln Commonly known as: LUMIGAN Place 1 drop into both eyes at bedtime.   CALTRATE 600+D PLUS PO Take 1 tablet by mouth daily.   carvedilol 25 MG tablet Commonly known as: COREG Take 25 mg by mouth 2  (two) times daily.   ciclopirox 0.77 % cream Commonly known as: LOPROX Apply 1 application topically 2 (two) times daily. As needed   cloNIDine 0.1 MG tablet Commonly known as: CATAPRES Take 0.1 mg by mouth 2 (two) times daily as needed (Elevated BP).   cyanocobalamin 2000 MCG tablet Take 2,000 mcg by mouth at bedtime.   diclofenac Sodium 1 % Gel Commonly known as: VOLTAREN Apply topically 4 (four) times daily. As needed   famciclovir 250 MG tablet Commonly known as: FAMVIR TAKE 1 TABLET DAILY   furosemide 20 MG tablet Commonly known as: LASIX Take 2 tablets (40 mg total) by mouth daily.   losartan 100 MG tablet Commonly known as: COZAAR Take 100 mg by mouth daily.   meloxicam 15 MG tablet Commonly known as: MOBIC Take 1 tablet by mouth daily as needed.   metolazone 5 MG tablet Commonly known as: ZAROXOLYN TAKE 1 TABLET 1 HOUR PRIOR TO TAKING LASIX DAILY   montelukast 10 MG tablet Commonly known as: SINGULAIR TAKE 1 TABLET AT BEDTIME   multivitamin with minerals Tabs tablet Take 1 tablet by mouth daily. Unknown strenght   omeprazole 20 MG capsule Commonly known as: PRILOSEC Take 1 capsule by mouth daily before breakfast.   potassium chloride SA 20 MEQ tablet Commonly known as: Robert [name]  Take 20 mEq by mouth daily.   rosuvastatin 20 MG tablet Commonly known as: CRESTOR Take 20 mg by mouth at bedtime.   SYSTANE OP Place 1 drop into both eyes daily as needed (dry eyes).   Vitamin D3 25 MCG (1000 UT) Caps Take 1,000 Units by mouth daily.        Allergies: No Known Allergies  Past Medical History, Surgical history, Social history, and Family History were reviewed and updated.  Review of Systems: All other 10 point review of systems is negative.   Physical Exam:  height is '5\' 11"'$  (1.803 m) and weight is 213 lb (96.6 kg). His oral temperature is 97.9 F (36.6 C). His blood pressure is 120/64 and his pulse is 62. His respiration is 19 and oxygen  saturation is 100%.   Wt Readings from Last 3 Encounters:  03/20/22 213 lb (96.6 kg)  02/18/22 214 lb (97.1 kg)  01/19/22 216 lb 0.6 oz (98 kg)    Ocular: Sclerae unicteric, pupils equal, round and reactive to light Ear-nose-throat: Oropharynx clear, dentition fair Lymphatic: No cervical or supraclavicular adenopathy Lungs no rales or rhonchi, good excursion bilaterally Heart regular rate and rhythm, no murmur appreciated Abd soft, nontender, positive bowel sounds MSK no focal spinal tenderness, no joint edema Neuro: non-focal, well-oriented, appropriate affect Breasts: Deferred   Lab Results  Component Value Date   WBC 6.4 03/20/2022   HGB 10.7 (L) 03/20/2022   HCT 32.1 (L) 03/20/2022   MCV 95.3 03/20/2022   PLT 197 03/20/2022   Lab Results  Component Value Date   FERRITIN 555 (H) 07/23/2021   IRON 84 07/23/2021   TIBC 325 07/23/2021   UIBC 241 07/23/2021   IRONPCTSAT 26 07/23/2021   Lab Results  Component Value Date   RETICCTPCT 2.2 04/05/2019   RBC 3.37 (L) 03/20/2022   Lab Results  Component Value Date   KPAFRELGTCHN 15.4 02/18/2022   LAMBDASER 8.3 02/18/2022   KAPLAMBRATIO 1.86 (H) 02/18/2022   Lab Results  Component Value Date   IGGSERUM 520 (L) 02/18/2022   IGA 30 (L) 02/18/2022   IGMSERUM 39 02/18/2022   Lab Results  Component Value Date   TOTALPROTELP 6.1 02/18/2022   ALBUMINELP 3.6 02/18/2022   A1GS 0.2 02/18/2022   A2GS 0.9 02/18/2022   BETS 0.8 02/18/2022   BETA2SER 0.3 01/03/2015   GAMS 0.5 02/18/2022   MSPIKE Not Observed 02/18/2022   SPEI Comment 02/18/2022     Chemistry      Component Value Date/Time   NA 139 02/18/2022 0955   NA 137 04/08/2017 1141   NA 138 10/29/2015 1029   K 3.5 02/18/2022 0955   K 3.4 04/08/2017 1141   K 3.9 10/29/2015 1029   CL 102 02/18/2022 0955   CL 102 04/08/2017 1141   CO2 28 02/18/2022 0955   CO2 30 04/08/2017 1141   CO2 27 10/29/2015 1029   BUN 24 (H) 02/18/2022 0955   BUN 16 04/08/2017 1141    BUN 13.8 10/29/2015 1029   CREATININE 1.28 (H) 02/18/2022 0955   CREATININE 1.3 (H) 04/08/2017 1141   CREATININE 1.1 10/29/2015 1029      Component Value Date/Time   CALCIUM 10.2 02/18/2022 0955   CALCIUM 9.3 04/08/2017 1141   CALCIUM 9.3 10/29/2015 1029   ALKPHOS 48 02/18/2022 0955   ALKPHOS 66 04/08/2017 1141   ALKPHOS 52 10/29/2015 1029   AST 14 (L) 02/18/2022 0955   AST 20 10/29/2015 1029   ALT 10 02/18/2022 0955  ALT 21 04/08/2017 1141   ALT 14 10/29/2015 1029   BILITOT 1.0 02/18/2022 0955   BILITOT 0.75 10/29/2015 1029       Impression and Plan: Mr. Schill is a very pleasant 88 yo African American gentleman with relapsed IgG kappa myeloma. He has history of stem cell transplant in 2006 and since relapsed.   He continues to tolerate Daratumumab nicely.  We will proceed with treatment as planned.  Protein studies are pending.  Follow-up in 1 month.   Lottie Dawson, NP 10/27/202311:06 AM

## 2022-03-22 LAB — IGG, IGA, IGM
IgA: 28 mg/dL — ABNORMAL LOW (ref 61–437)
IgG (Immunoglobin G), Serum: 500 mg/dL — ABNORMAL LOW (ref 603–1613)
IgM (Immunoglobulin M), Srm: 50 mg/dL (ref 15–143)

## 2022-03-23 LAB — KAPPA/LAMBDA LIGHT CHAINS
Kappa free light chain: 13.4 mg/L (ref 3.3–19.4)
Kappa, lambda light chain ratio: 1.97 — ABNORMAL HIGH (ref 0.26–1.65)
Lambda free light chains: 6.8 mg/L (ref 5.7–26.3)

## 2022-03-24 LAB — PROTEIN ELECTROPHORESIS, SERUM
A/G Ratio: 1.5 (ref 0.7–1.7)
Albumin ELP: 3.5 g/dL (ref 2.9–4.4)
Alpha-1-Globulin: 0.2 g/dL (ref 0.0–0.4)
Alpha-2-Globulin: 0.9 g/dL (ref 0.4–1.0)
Beta Globulin: 0.8 g/dL (ref 0.7–1.3)
Gamma Globulin: 0.5 g/dL (ref 0.4–1.8)
Globulin, Total: 2.4 g/dL (ref 2.2–3.9)
Total Protein ELP: 5.9 g/dL — ABNORMAL LOW (ref 6.0–8.5)

## 2022-03-26 ENCOUNTER — Telehealth: Payer: Self-pay | Admitting: *Deleted

## 2022-03-26 NOTE — Telephone Encounter (Signed)
Per 03/20/22 los - called and lvm of upcoming appointments - requested callback to confirm.

## 2022-04-14 ENCOUNTER — Other Ambulatory Visit: Payer: Self-pay | Admitting: Hematology & Oncology

## 2022-04-14 DIAGNOSIS — D630 Anemia in neoplastic disease: Secondary | ICD-10-CM

## 2022-04-20 ENCOUNTER — Inpatient Hospital Stay: Payer: Medicare Other

## 2022-04-20 ENCOUNTER — Encounter: Payer: Self-pay | Admitting: Oncology

## 2022-04-20 ENCOUNTER — Inpatient Hospital Stay: Payer: Medicare Other | Attending: Hematology & Oncology

## 2022-04-20 ENCOUNTER — Inpatient Hospital Stay (HOSPITAL_BASED_OUTPATIENT_CLINIC_OR_DEPARTMENT_OTHER): Payer: Medicare Other | Admitting: Oncology

## 2022-04-20 VITALS — BP 150/84 | HR 63 | Temp 97.6°F | Resp 17 | Wt 211.0 lb

## 2022-04-20 VITALS — BP 144/82 | HR 60

## 2022-04-20 DIAGNOSIS — C9000 Multiple myeloma not having achieved remission: Secondary | ICD-10-CM | POA: Diagnosis not present

## 2022-04-20 DIAGNOSIS — E782 Mixed hyperlipidemia: Secondary | ICD-10-CM

## 2022-04-20 DIAGNOSIS — D630 Anemia in neoplastic disease: Secondary | ICD-10-CM | POA: Insufficient documentation

## 2022-04-20 DIAGNOSIS — M899 Disorder of bone, unspecified: Secondary | ICD-10-CM

## 2022-04-20 DIAGNOSIS — E538 Deficiency of other specified B group vitamins: Secondary | ICD-10-CM

## 2022-04-20 DIAGNOSIS — K909 Intestinal malabsorption, unspecified: Secondary | ICD-10-CM

## 2022-04-20 DIAGNOSIS — Z79899 Other long term (current) drug therapy: Secondary | ICD-10-CM | POA: Insufficient documentation

## 2022-04-20 DIAGNOSIS — Z5112 Encounter for antineoplastic immunotherapy: Secondary | ICD-10-CM | POA: Diagnosis present

## 2022-04-20 DIAGNOSIS — C9001 Multiple myeloma in remission: Secondary | ICD-10-CM

## 2022-04-20 DIAGNOSIS — M898X9 Other specified disorders of bone, unspecified site: Secondary | ICD-10-CM

## 2022-04-20 DIAGNOSIS — I10 Essential (primary) hypertension: Secondary | ICD-10-CM

## 2022-04-20 DIAGNOSIS — M1990 Unspecified osteoarthritis, unspecified site: Secondary | ICD-10-CM

## 2022-04-20 DIAGNOSIS — R1909 Other intra-abdominal and pelvic swelling, mass and lump: Secondary | ICD-10-CM

## 2022-04-20 DIAGNOSIS — C9002 Multiple myeloma in relapse: Secondary | ICD-10-CM | POA: Insufficient documentation

## 2022-04-20 DIAGNOSIS — Z7189 Other specified counseling: Secondary | ICD-10-CM

## 2022-04-20 DIAGNOSIS — M7989 Other specified soft tissue disorders: Secondary | ICD-10-CM

## 2022-04-20 DIAGNOSIS — D631 Anemia in chronic kidney disease: Secondary | ICD-10-CM

## 2022-04-20 DIAGNOSIS — Z9481 Bone marrow transplant status: Secondary | ICD-10-CM

## 2022-04-20 DIAGNOSIS — D508 Other iron deficiency anemias: Secondary | ICD-10-CM

## 2022-04-20 DIAGNOSIS — E785 Hyperlipidemia, unspecified: Secondary | ICD-10-CM

## 2022-04-20 LAB — CBC WITH DIFFERENTIAL (CANCER CENTER ONLY)
Abs Immature Granulocytes: 0.02 10*3/uL (ref 0.00–0.07)
Basophils Absolute: 0 10*3/uL (ref 0.0–0.1)
Basophils Relative: 1 %
Eosinophils Absolute: 0.1 10*3/uL (ref 0.0–0.5)
Eosinophils Relative: 2 %
HCT: 34.5 % — ABNORMAL LOW (ref 39.0–52.0)
Hemoglobin: 11.4 g/dL — ABNORMAL LOW (ref 13.0–17.0)
Immature Granulocytes: 0 %
Lymphocytes Relative: 38 %
Lymphs Abs: 2.4 10*3/uL (ref 0.7–4.0)
MCH: 31.5 pg (ref 26.0–34.0)
MCHC: 33 g/dL (ref 30.0–36.0)
MCV: 95.3 fL (ref 80.0–100.0)
Monocytes Absolute: 0.6 10*3/uL (ref 0.1–1.0)
Monocytes Relative: 9 %
Neutro Abs: 3.2 10*3/uL (ref 1.7–7.7)
Neutrophils Relative %: 50 %
Platelet Count: 193 10*3/uL (ref 150–400)
RBC: 3.62 MIL/uL — ABNORMAL LOW (ref 4.22–5.81)
RDW: 13.2 % (ref 11.5–15.5)
WBC Count: 6.4 10*3/uL (ref 4.0–10.5)
nRBC: 0 % (ref 0.0–0.2)

## 2022-04-20 LAB — CMP (CANCER CENTER ONLY)
ALT: 10 U/L (ref 0–44)
AST: 15 U/L (ref 15–41)
Albumin: 4.4 g/dL (ref 3.5–5.0)
Alkaline Phosphatase: 48 U/L (ref 38–126)
Anion gap: 7 (ref 5–15)
BUN: 27 mg/dL — ABNORMAL HIGH (ref 8–23)
CO2: 28 mmol/L (ref 22–32)
Calcium: 10.2 mg/dL (ref 8.9–10.3)
Chloride: 102 mmol/L (ref 98–111)
Creatinine: 1.27 mg/dL — ABNORMAL HIGH (ref 0.61–1.24)
GFR, Estimated: 55 mL/min — ABNORMAL LOW (ref >60)
Glucose, Bld: 113 mg/dL — ABNORMAL HIGH (ref 70–99)
Potassium: 3.5 mmol/L (ref 3.5–5.1)
Sodium: 137 mmol/L (ref 135–145)
Total Bilirubin: 0.8 mg/dL (ref 0.3–1.2)
Total Protein: 6.1 g/dL — ABNORMAL LOW (ref 6.5–8.1)

## 2022-04-20 LAB — LACTATE DEHYDROGENASE: LDH: 180 U/L (ref 98–192)

## 2022-04-20 MED ORDER — HEPARIN SOD (PORK) LOCK FLUSH 100 UNIT/ML IV SOLN
500.0000 [IU] | Freq: Once | INTRAVENOUS | Status: AC | PRN
  Administered 2022-04-20: 500 [IU]

## 2022-04-20 MED ORDER — DEXAMETHASONE 4 MG PO TABS
20.0000 mg | ORAL_TABLET | Freq: Once | ORAL | Status: AC
  Administered 2022-04-20: 20 mg via ORAL
  Filled 2022-04-20: qty 5

## 2022-04-20 MED ORDER — ACETAMINOPHEN 325 MG PO TABS
650.0000 mg | ORAL_TABLET | Freq: Once | ORAL | Status: AC
  Administered 2022-04-20: 650 mg via ORAL
  Filled 2022-04-20: qty 2

## 2022-04-20 MED ORDER — DARATUMUMAB-HYALURONIDASE-FIHJ 1800-30000 MG-UT/15ML ~~LOC~~ SOLN
1800.0000 mg | Freq: Once | SUBCUTANEOUS | Status: AC
  Administered 2022-04-20: 1800 mg via SUBCUTANEOUS
  Filled 2022-04-20: qty 15

## 2022-04-20 MED ORDER — DIPHENHYDRAMINE HCL 25 MG PO CAPS
50.0000 mg | ORAL_CAPSULE | Freq: Once | ORAL | Status: AC
  Administered 2022-04-20: 50 mg via ORAL
  Filled 2022-04-20: qty 2

## 2022-04-20 MED ORDER — SODIUM CHLORIDE 0.9% FLUSH
10.0000 mL | INTRAVENOUS | Status: DC | PRN
  Administered 2022-04-20: 10 mL

## 2022-04-20 NOTE — Patient Instructions (Signed)

## 2022-04-20 NOTE — Addendum Note (Signed)
Addended by: Burney Gauze R on: 04/20/2022 10:23 AM   Modules accepted: Orders

## 2022-04-20 NOTE — Progress Notes (Signed)
Hematology and Oncology Follow Up Visit  Robert [name] [id number] 10/29/1933 88 y.o. 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 is here today for follow-up and treatment. He is doing well.  Reports osteoarthritis pain to his right knee which he is improving.  Followed by Ortho. He has intermittent numbness and tingling in the fingers of his right hands.  No falls or syncope reported.  No fever, chills, n/v, cough, rash, dizziness, SOB, chest pain, palpitations, abdominal pain or changes in bowel or bladder habits.  No blood loss, bruising or petechiae.  Appetite and hydration are good.    ECOG Performance Status: 1 - Symptomatic but completely ambulatory  Medications:  Allergies as of 04/20/2022   No Known Allergies      Medication List        Accurate as of April 20, 2022  8:40 AM. If you have any questions, ask your nurse or doctor.          acetaminophen-codeine 300-30 MG tablet Commonly known as: TYLENOL #3 Take 1 tablet by mouth every 8 (eight) hours as needed for moderate pain.   aspirin 81 MG tablet Take 81 mg by mouth daily.   bimatoprost 0.01 % Soln Commonly known as: LUMIGAN Place 1 drop into both eyes at bedtime.   CALTRATE 600+D PLUS PO Take 1 tablet by mouth daily.   carvedilol 25 MG tablet Commonly known as: COREG Take 25 mg by mouth 2 (two) times daily.   ciclopirox 0.77 % cream Commonly known as: LOPROX Apply 1 application topically 2 (two) times daily. As needed    cloNIDine 0.1 MG tablet Commonly known as: CATAPRES Take 0.1 mg by mouth 2 (two) times daily as needed (Elevated BP).   cyanocobalamin 2000 MCG tablet Take 2,000 mcg by mouth at bedtime.   diclofenac Sodium 1 % Gel Commonly known as: VOLTAREN Apply topically 4 (four) times daily. As needed   famciclovir 250 MG tablet Commonly known as: FAMVIR TAKE 1 TABLET DAILY   furosemide 20 MG tablet Commonly known as: LASIX Take 2 tablets (40 mg total) by mouth daily.   losartan 100 MG tablet Commonly known as: COZAAR Take 100 mg by mouth daily.   meloxicam 15 MG tablet Commonly known as: MOBIC Take 1 tablet by mouth daily as needed.   metolazone 5 MG tablet Commonly known as: ZAROXOLYN TAKE 1 TABLET 1 HOUR PRIOR TO TAKING LASIX DAILY   montelukast 10 MG tablet Commonly known as: SINGULAIR TAKE 1 TABLET AT BEDTIME   multivitamin with minerals Tabs tablet Take 1 tablet by mouth daily. Unknown strenght   omeprazole 20 MG capsule Commonly known as: PRILOSEC Take 1 capsule by mouth daily before breakfast.   potassium chloride SA 20 MEQ tablet Commonly known as: KLOR-CON M Take 20 mEq by mouth daily.   rosuvastatin 20 MG tablet Commonly known as: CRESTOR Take 20 mg by mouth at bedtime.   SYSTANE OP Place  1 drop into both eyes daily as needed (dry eyes).   Vitamin D3 25 MCG (1000 UT) Caps Take 1,000 Units by mouth daily.        Allergies: No Known Allergies  Past Medical History, Surgical history, Social history, and Family History were reviewed and updated.  Review of Systems: All other 10 point review of systems is negative.   Physical Exam:  vitals were not taken for this visit.   Wt Readings from Last 3 Encounters:  03/20/22 96.6 kg  02/18/22 97.1 kg  01/19/22 98 kg    Ocular: Sclerae unicteric, pupils equal, round and reactive to light Ear-nose-throat: Oropharynx clear, dentition fair Lymphatic: No cervical or supraclavicular adenopathy Lungs no  rales or rhonchi, good excursion bilaterally Heart regular rate and rhythm, no murmur appreciated Abd soft, nontender, positive bowel sounds MSK no focal spinal tenderness, no joint edema Neuro: non-focal, well-oriented, appropriate affect Breasts: Deferred   Lab Results  Component Value Date   WBC 6.4 03/20/2022   HGB 10.7 (L) 03/20/2022   HCT 32.1 (L) 03/20/2022   MCV 95.3 03/20/2022   PLT 197 03/20/2022   Lab Results  Component Value Date   FERRITIN 555 (H) 07/23/2021   IRON 84 07/23/2021   TIBC 325 07/23/2021   UIBC 241 07/23/2021   IRONPCTSAT 26 07/23/2021   Lab Results  Component Value Date   RETICCTPCT 2.2 04/05/2019   RBC 3.37 (L) 03/20/2022   Lab Results  Component Value Date   KPAFRELGTCHN 13.4 03/20/2022   LAMBDASER 6.8 03/20/2022   KAPLAMBRATIO 1.97 (H) 03/20/2022   Lab Results  Component Value Date   IGGSERUM 500 (L) 03/20/2022   IGA 28 (L) 03/20/2022   IGMSERUM 50 03/20/2022   Lab Results  Component Value Date   TOTALPROTELP 5.9 (L) 03/20/2022   ALBUMINELP 3.5 03/20/2022   A1GS 0.2 03/20/2022   A2GS 0.9 03/20/2022   BETS 0.8 03/20/2022   BETA2SER 0.3 01/03/2015   GAMS 0.5 03/20/2022   MSPIKE Not Observed 03/20/2022   SPEI Comment 03/20/2022     Chemistry      Component Value Date/Time   NA 138 03/20/2022 1030   NA 137 04/08/2017 1141   NA 138 10/29/2015 1029   K 3.4 (L) 03/20/2022 1030   K 3.4 04/08/2017 1141   K 3.9 10/29/2015 1029   CL 101 03/20/2022 1030   CL 102 04/08/2017 1141   CO2 29 03/20/2022 1030   CO2 30 04/08/2017 1141   CO2 27 10/29/2015 1029   BUN 19 03/20/2022 1030   BUN 16 04/08/2017 1141   BUN 13.8 10/29/2015 1029   CREATININE 1.23 03/20/2022 1030   CREATININE 1.3 (H) 04/08/2017 1141   CREATININE 1.1 10/29/2015 1029      Component Value Date/Time   CALCIUM 10.2 03/20/2022 1030   CALCIUM 9.3 04/08/2017 1141   CALCIUM 9.3 10/29/2015 1029   ALKPHOS 42 03/20/2022 1030   ALKPHOS 66 04/08/2017 1141   ALKPHOS 52  10/29/2015 1029   AST 14 (L) 03/20/2022 1030   AST 20 10/29/2015 1029   ALT 9 03/20/2022 1030   ALT 21 04/08/2017 1141   ALT 14 10/29/2015 1029   BILITOT 1.0 03/20/2022 1030   BILITOT 0.75 10/29/2015 1029       Impression and Plan: [name] is a very pleasant 88 yo African American gentleman with relapsed IgG kappa myeloma. He has history of stem cell transplant in 2006 and since relapsed.   He continues to tolerate Daratumumab nicely.  We will proceed with treatment as planned.  Protein studies are pending.  Follow-up in 1 month.   Mikey Bussing, NP 11/27/20238:40 AM

## 2022-04-21 LAB — IGG, IGA, IGM
IgA: 29 mg/dL — ABNORMAL LOW (ref 61–437)
IgG (Immunoglobin G), Serum: 528 mg/dL — ABNORMAL LOW (ref 603–1613)
IgM (Immunoglobulin M), Srm: 51 mg/dL (ref 15–143)

## 2022-04-21 LAB — KAPPA/LAMBDA LIGHT CHAINS
Kappa free light chain: 11.4 mg/L (ref 3.3–19.4)
Kappa, lambda light chain ratio: 2.38 — ABNORMAL HIGH (ref 0.26–1.65)
Lambda free light chains: 4.8 mg/L — ABNORMAL LOW (ref 5.7–26.3)

## 2022-04-22 LAB — PROTEIN ELECTROPHORESIS, SERUM
A/G Ratio: 1.6 (ref 0.7–1.7)
Albumin ELP: 3.6 g/dL (ref 2.9–4.4)
Alpha-1-Globulin: 0.2 g/dL (ref 0.0–0.4)
Alpha-2-Globulin: 0.7 g/dL (ref 0.4–1.0)
Beta Globulin: 0.9 g/dL (ref 0.7–1.3)
Gamma Globulin: 0.4 g/dL (ref 0.4–1.8)
Globulin, Total: 2.3 g/dL (ref 2.2–3.9)
Total Protein ELP: 5.9 g/dL — ABNORMAL LOW (ref 6.0–8.5)

## 2022-05-01 ENCOUNTER — Emergency Department (HOSPITAL_BASED_OUTPATIENT_CLINIC_OR_DEPARTMENT_OTHER)
Admission: EM | Admit: 2022-05-01 | Discharge: 2022-05-01 | Disposition: A | Discharge status: Home / Self Care | Payer: Medicare Other | Attending: Emergency Medicine | Admitting: Emergency Medicine

## 2022-05-01 ENCOUNTER — Other Ambulatory Visit (HOSPITAL_BASED_OUTPATIENT_CLINIC_OR_DEPARTMENT_OTHER): Payer: Self-pay

## 2022-05-01 ENCOUNTER — Emergency Department (HOSPITAL_BASED_OUTPATIENT_CLINIC_OR_DEPARTMENT_OTHER): Payer: Medicare Other

## 2022-05-01 ENCOUNTER — Encounter (HOSPITAL_BASED_OUTPATIENT_CLINIC_OR_DEPARTMENT_OTHER): Payer: Self-pay | Admitting: Emergency Medicine

## 2022-05-01 ENCOUNTER — Other Ambulatory Visit: Payer: Self-pay

## 2022-05-01 DIAGNOSIS — I10 Essential (primary) hypertension: Secondary | ICD-10-CM

## 2022-05-01 DIAGNOSIS — C9001 Multiple myeloma in remission: Secondary | ICD-10-CM | POA: Diagnosis not present

## 2022-05-01 DIAGNOSIS — U071 COVID-19: Secondary | ICD-10-CM | POA: Diagnosis not present

## 2022-05-01 DIAGNOSIS — Z7982 Long term (current) use of aspirin: Secondary | ICD-10-CM | POA: Insufficient documentation

## 2022-05-01 DIAGNOSIS — R030 Elevated blood-pressure reading, without diagnosis of hypertension: Secondary | ICD-10-CM

## 2022-05-01 DIAGNOSIS — C9000 Multiple myeloma not having achieved remission: Secondary | ICD-10-CM

## 2022-05-01 DIAGNOSIS — R059 Cough, unspecified: Secondary | ICD-10-CM | POA: Diagnosis present

## 2022-05-01 LAB — RESP PANEL BY RT-PCR (FLU A&B, COVID) ARPGX2
Influenza A by PCR: NEGATIVE
Influenza B by PCR: NEGATIVE
SARS Coronavirus 2 by RT PCR: POSITIVE — AB

## 2022-05-01 MED ORDER — MOLNUPIRAVIR EUA 200MG CAPSULE
4.0000 | ORAL_CAPSULE | Freq: Two times a day (BID) | ORAL | 0 refills | Status: AC
  Filled 2022-05-01: qty 40, 5d supply, fill #0

## 2022-05-01 NOTE — Discharge Instructions (Addendum)
It was our pleasure to provide your ER care today - we hope that you feel better.  Drink plenty of fluids/stay well hydrated. Take acetaminophen and/or ibuprofen as need for fever and body aches.  Take molnupiravir as prescribed.   Follow up with primary care doctor in two weeks if symptoms fail to improve/resolve. Also follow up with your doctor regarding your blood pressure.   Return to ER if worse, new symptoms, trouble breathing, or other concern.

## 2022-05-01 NOTE — ED Triage Notes (Signed)
States has been having a cough since yesterday, non-productive. Currently taking chemo

## 2022-05-01 NOTE — ED Provider Notes (Addendum)
[facility] EMERGENCY DEPARTMENT Provider Note   CSN: [id number] Arrival date & time: 05/01/22  5176     History  Chief Complaint  Patient presents with   Cough    HAMP MORELAND is a 88 y.o. male.  Pt with non prod cough, nasal congestion/runny nose, in the past 1-2 days. Symptoms acute onset, moderate, persistent. No specific known ill contacts. No fever. No sore throat. Is eating/drinking. No abd pain or nvd. No dysuria or gu c/o. Hx multiple myeloma, recent chemo. No rash. No leg pain/swelling. No faintness or dizziness.   The history is provided by the patient and medical records.  Cough Associated symptoms: rhinorrhea   Associated symptoms: no chest pain, no chills, no diaphoresis, no fever, no headaches, no rash, no shortness of breath and no sore throat        Home Medications Prior to Admission medications   Medication Sig Start Date End Date Taking? Authorizing Provider  acetaminophen-codeine (TYLENOL #3) 300-30 MG tablet Take 1 tablet by mouth every 8 (eight) hours as needed for moderate pain. 12/30/21   Aundra Dubin, PA-C  aspirin 81 MG tablet Take 81 mg by mouth daily.    [provider]  bimatoprost (LUMIGAN) 0.01 % SOLN Place 1 drop into both eyes at bedtime.  12/07/11   [provider]  Calcium Carbonate-Vit D-Min (CALTRATE 600+D PLUS PO) Take 1 tablet by mouth daily.    [provider]  carvedilol (COREG) 25 MG tablet Take 25 mg by mouth 2 (two) times daily. 09/08/21   [provider]  Cholecalciferol (VITAMIN D3) 1000 UNITS CAPS Take 1,000 Units by mouth daily.     [provider]  ciclopirox (LOPROX) 0.77 % cream Apply 1 application topically 2 (two) times daily. As needed 10/13/19   [provider]  cloNIDine (CATAPRES) 0.1 MG tablet Take 0.1 mg by mouth 2 (two) times daily as needed (Elevated BP).    [provider]  cyanocobalamin 2000 MCG tablet Take 2,000 mcg by mouth at bedtime.      [provider]  diclofenac Sodium (VOLTAREN) 1 % GEL Apply topically 4 (four) times daily. As needed    [provider]  famciclovir (FAMVIR) 250 MG tablet TAKE 1 TABLET DAILY 01/12/22   Volanda Napoleon, MD  furosemide (LASIX) 20 MG tablet Take 2 tablets (40 mg total) by mouth daily. 01/08/22   Park Liter, MD  losartan (COZAAR) 100 MG tablet Take 100 mg by mouth daily.    [provider]  meloxicam (MOBIC) 15 MG tablet Take 1 tablet by mouth daily as needed. 10/10/21   [provider]  metolazone (ZAROXOLYN) 5 MG tablet TAKE 1 TABLET 1 HOUR PRIOR TO TAKING LASIX DAILY 04/14/22   Volanda Napoleon, MD  montelukast (SINGULAIR) 10 MG tablet TAKE 1 TABLET AT BEDTIME 08/27/21   Celso Amy, NP  Multiple Vitamin (MULITIVITAMIN WITH MINERALS) TABS Take 1 tablet by mouth daily. Unknown strenght    [provider]  omeprazole (PRILOSEC) 20 MG capsule Take 1 capsule by mouth daily before breakfast. 10/10/21   [provider]  Polyethyl Glycol-Propyl Glycol (SYSTANE OP) Place 1 drop into both eyes daily as needed (dry eyes).    [provider]  potassium chloride SA (K-DUR,KLOR-CON) 20 MEQ tablet Take 20 mEq by mouth daily.    [provider]  rosuvastatin (CRESTOR) 20 MG tablet Take 20 mg by mouth at bedtime. 02/29/20   [provider]      Allergies    Patient has no known allergies.    Review of Systems   Review of Systems  Constitutional:  Negative for chills, diaphoresis and fever.  HENT:  Positive for congestion and rhinorrhea. Negative for sore throat.   Eyes:  Negative for redness.  Respiratory:  Positive for cough. Negative for shortness of breath.   Cardiovascular:  Negative for chest pain and leg swelling.  Gastrointestinal:  Negative for abdominal pain, diarrhea and vomiting.  Genitourinary:  Negative for dysuria and flank pain.  Musculoskeletal:  Negative for neck pain and neck stiffness.  Skin:   Negative for rash.  Neurological:  Negative for headaches.  Hematological:  Does not bruise/bleed easily.  Psychiatric/Behavioral:  Negative for confusion.     Physical Exam Updated Vital Signs BP (!) 166/89 (BP Location: Right Arm)   Pulse 72   Temp 97.6 F (36.4 C) (Oral)   Resp 20   Ht 1.803 m (_0 )   Wt 95.7 kg   SpO2 100%   BMI 29.43 kg/m  Physical Exam Vitals and nursing note reviewed.  Constitutional:      Appearance: Normal appearance. He is well-developed.  HENT:     Head: Atraumatic.     Nose: Congestion and rhinorrhea present.     Mouth/Throat:     Mouth: Mucous membranes are moist.     Pharynx: Oropharynx is clear.  Eyes:     General: No scleral icterus.    Conjunctiva/sclera: Conjunctivae normal.  Neck:     Trachea: No tracheal deviation.     Comments: No stiffness or rigidity. Cardiovascular:     Rate and Rhythm: Normal rate and regular rhythm.     Pulses: Normal pulses.     Heart sounds: Normal heart sounds. No murmur heard.    No friction rub. No gallop.  Pulmonary:     Effort: Pulmonary effort is normal. No accessory muscle usage or respiratory distress.     Breath sounds: Normal breath sounds.  Abdominal:     General: There is no distension.     Palpations: Abdomen is soft.     Tenderness: There is no abdominal tenderness. There is no guarding.  Musculoskeletal:        General: No swelling or tenderness.     Cervical back: Normal range of motion and neck supple. No rigidity.     Right lower leg: No edema.     Left lower leg: No edema.  Skin:    General: Skin is warm and dry.     Findings: No rash.  Neurological:     Mental Status: He is alert.     Comments: Alert, speech clear.   Psychiatric:        Mood and Affect: Mood normal.     ED Results / Procedures / Treatments   Labs (all labs ordered are listed, but only abnormal results are displayed) Labs Reviewed  RESP PANEL BY RT-PCR (FLU A&B, COVID) ARPGX2 - Abnormal; Notable for  the following components:      Result Value   SARS Coronavirus 2 by RT PCR POSITIVE (*)    All other components within normal limits    EKG None  Radiology DG Chest 2 View  Result Date: 05/01/2022 CLINICAL DATA:  Cough.  Currently on chemotherapy. EXAM: CHEST - 2 VIEW COMPARISON:  Chest two views 09/15/2018, 12/24/2017, 04/14/2012 FINDINGS: Right chest wall porta catheter with tip overlying the mid superior vena cava, unchanged. Cardiac silhouette  is again mildly enlarged. Mediastinal contours are within normal limits with moderately tortuous thoracic aorta and mild-to-moderate aortic arch atherosclerotic calcification similar to prior. Density overlying the right lung bases unchanged from prior and appears to correspond to anterior right rib costochondral margins. The lungs appear clear. No pleural effusion or pneumothorax. Moderate multilevel degenerative disc changes of the lower greater than midthoracic spine. IMPRESSION: 1. No acute lung process. 2. Mild cardiomegaly. 3. Aortic atherosclerosis. Electronically Signed   By: Yvonne Kendall M.D.   On: 05/01/2022 09:53    Procedures Procedures    Medications Ordered in ED Medications - No data to display  ED Course/ Medical Decision Making/ A&P                           Medical Decision Making Problems Addressed: COVID-19 virus infection: acute illness or injury with systemic symptoms that poses a threat to life or bodily functions Elevated blood pressure reading: acute illness or injury Essential hypertension: chronic illness or injury with exacerbation, progression, or side effects of treatment Multiple myeloma not having achieved remission ([place]): chronic illness or injury that poses a threat to life or bodily functions  Amount and/or Complexity of Data Reviewed Independent Historian:     Details: Fam, hx External Data Reviewed: notes. Labs: ordered. Decision-making details documented in ED Course. Radiology: ordered and  independent interpretation performed. Decision-making details documented in ED Course.  Risk Prescription drug management. Decision regarding hospitalization.   Iv ns. Continuous pulse ox and cardiac monitoring. Labs ordered/sent. Imaging ordered.   Diff dx includes pna, viral uri, covid, flu, etc - dispo decision including potential need for admission considered if significant pna on cxr - will get labs and imaging and reassess.   Reviewed nursing notes and prior charts for additional history. External reports reviewed.   Cardiac monitor: sinus rhythm, rate 72.  Labs reviewed/interpreted by me - covid pos.   Xrays reviewed/interpreted by me - no pna.  Recheck breathing comfortably, no distress, sats 100%.    Pt appears stable for d/c.   Return precautions provided.          Final Clinical Impression(s) / ED Diagnoses Final diagnoses:  None    Rx / DC Orders ED Discharge Orders     None          Lajean Saver, MD 05/01/22 1105

## 2022-05-01 NOTE — ED Notes (Signed)
ED Provider at bedside. 

## 2022-05-19 ENCOUNTER — Ambulatory Visit: Payer: Medicare Other

## 2022-05-19 ENCOUNTER — Other Ambulatory Visit: Payer: Medicare Other

## 2022-05-19 ENCOUNTER — Ambulatory Visit: Payer: Medicare Other | Admitting: Family

## 2022-06-01 ENCOUNTER — Other Ambulatory Visit: Payer: Self-pay | Admitting: Family

## 2022-06-01 DIAGNOSIS — C9000 Multiple myeloma not having achieved remission: Secondary | ICD-10-CM

## 2022-06-01 DIAGNOSIS — C9001 Multiple myeloma in remission: Secondary | ICD-10-CM

## 2022-06-02 ENCOUNTER — Inpatient Hospital Stay: Payer: Medicare Other

## 2022-06-02 ENCOUNTER — Other Ambulatory Visit: Payer: Self-pay

## 2022-06-02 ENCOUNTER — Inpatient Hospital Stay (HOSPITAL_BASED_OUTPATIENT_CLINIC_OR_DEPARTMENT_OTHER): Payer: Medicare Other | Admitting: Family

## 2022-06-02 ENCOUNTER — Encounter: Payer: Self-pay | Admitting: Family

## 2022-06-02 ENCOUNTER — Inpatient Hospital Stay: Payer: Medicare Other | Attending: Hematology & Oncology

## 2022-06-02 VITALS — BP 169/80 | HR 66 | Temp 97.8°F | Resp 18 | Ht 71.0 in | Wt 214.8 lb

## 2022-06-02 DIAGNOSIS — M899 Disorder of bone, unspecified: Secondary | ICD-10-CM

## 2022-06-02 DIAGNOSIS — D631 Anemia in chronic kidney disease: Secondary | ICD-10-CM

## 2022-06-02 DIAGNOSIS — D630 Anemia in neoplastic disease: Secondary | ICD-10-CM | POA: Insufficient documentation

## 2022-06-02 DIAGNOSIS — C9000 Multiple myeloma not having achieved remission: Secondary | ICD-10-CM

## 2022-06-02 DIAGNOSIS — Z79899 Other long term (current) drug therapy: Secondary | ICD-10-CM | POA: Insufficient documentation

## 2022-06-02 DIAGNOSIS — Z5112 Encounter for antineoplastic immunotherapy: Secondary | ICD-10-CM | POA: Diagnosis present

## 2022-06-02 DIAGNOSIS — C9002 Multiple myeloma in relapse: Secondary | ICD-10-CM | POA: Diagnosis present

## 2022-06-02 DIAGNOSIS — C9001 Multiple myeloma in remission: Secondary | ICD-10-CM

## 2022-06-02 DIAGNOSIS — N183 Chronic kidney disease, stage 3 unspecified: Secondary | ICD-10-CM

## 2022-06-02 DIAGNOSIS — M898X9 Other specified disorders of bone, unspecified site: Secondary | ICD-10-CM

## 2022-06-02 LAB — CMP (CANCER CENTER ONLY)
ALT: 10 U/L (ref 0–44)
AST: 14 U/L — ABNORMAL LOW (ref 15–41)
Albumin: 4.2 g/dL (ref 3.5–5.0)
Alkaline Phosphatase: 48 U/L (ref 38–126)
Anion gap: 7 (ref 5–15)
BUN: 17 mg/dL (ref 8–23)
CO2: 30 mmol/L (ref 22–32)
Calcium: 9.7 mg/dL (ref 8.9–10.3)
Chloride: 101 mmol/L (ref 98–111)
Creatinine: 1.17 mg/dL (ref 0.61–1.24)
GFR, Estimated: >60 mL/min (ref >60)
Glucose, Bld: 114 mg/dL — ABNORMAL HIGH (ref 70–99)
Potassium: 3.6 mmol/L (ref 3.5–5.1)
Sodium: 138 mmol/L (ref 135–145)
Total Bilirubin: 0.7 mg/dL (ref 0.3–1.2)
Total Protein: 6.5 g/dL (ref 6.5–8.1)

## 2022-06-02 LAB — CBC WITH DIFFERENTIAL (CANCER CENTER ONLY)
Abs Immature Granulocytes: 0.02 10*3/uL (ref 0.00–0.07)
Basophils Absolute: 0 10*3/uL (ref 0.0–0.1)
Basophils Relative: 0 %
Eosinophils Absolute: 0.2 10*3/uL (ref 0.0–0.5)
Eosinophils Relative: 3 %
HCT: 34.3 % — ABNORMAL LOW (ref 39.0–52.0)
Hemoglobin: 11.2 g/dL — ABNORMAL LOW (ref 13.0–17.0)
Immature Granulocytes: 0 %
Lymphocytes Relative: 36 %
Lymphs Abs: 2.2 10*3/uL (ref 0.7–4.0)
MCH: 30.5 pg (ref 26.0–34.0)
MCHC: 32.7 g/dL (ref 30.0–36.0)
MCV: 93.5 fL (ref 80.0–100.0)
Monocytes Absolute: 0.6 10*3/uL (ref 0.1–1.0)
Monocytes Relative: 10 %
Neutro Abs: 3 10*3/uL (ref 1.7–7.7)
Neutrophils Relative %: 51 %
Platelet Count: 190 10*3/uL (ref 150–400)
RBC: 3.67 MIL/uL — ABNORMAL LOW (ref 4.22–5.81)
RDW: 13.4 % (ref 11.5–15.5)
WBC Count: 5.9 10*3/uL (ref 4.0–10.5)
nRBC: 0 % (ref 0.0–0.2)

## 2022-06-02 LAB — LACTATE DEHYDROGENASE: LDH: 208 U/L — ABNORMAL HIGH (ref 98–192)

## 2022-06-02 MED ORDER — HEPARIN SOD (PORK) LOCK FLUSH 100 UNIT/ML IV SOLN
500.0000 [IU] | Freq: Once | INTRAVENOUS | Status: AC | PRN
  Administered 2022-06-02: 500 [IU]

## 2022-06-02 MED ORDER — ACETAMINOPHEN 325 MG PO TABS
650.0000 mg | ORAL_TABLET | Freq: Once | ORAL | Status: AC
  Administered 2022-06-02: 650 mg via ORAL
  Filled 2022-06-02: qty 2

## 2022-06-02 MED ORDER — ZOLEDRONIC ACID 4 MG/100ML IV SOLN
4.0000 mg | Freq: Once | INTRAVENOUS | Status: AC
  Administered 2022-06-02: 4 mg via INTRAVENOUS
  Filled 2022-06-02: qty 100

## 2022-06-02 MED ORDER — SODIUM CHLORIDE 0.9% FLUSH
10.0000 mL | INTRAVENOUS | Status: DC | PRN
  Administered 2022-06-02: 10 mL

## 2022-06-02 MED ORDER — DARATUMUMAB-HYALURONIDASE-FIHJ 1800-30000 MG-UT/15ML ~~LOC~~ SOLN
1800.0000 mg | Freq: Once | SUBCUTANEOUS | Status: AC
  Administered 2022-06-02: 1800 mg via SUBCUTANEOUS
  Filled 2022-06-02: qty 15

## 2022-06-02 MED ORDER — DEXAMETHASONE 4 MG PO TABS
20.0000 mg | ORAL_TABLET | Freq: Once | ORAL | Status: AC
  Administered 2022-06-02: 20 mg via ORAL
  Filled 2022-06-02: qty 5

## 2022-06-02 MED ORDER — DIPHENHYDRAMINE HCL 25 MG PO CAPS
50.0000 mg | ORAL_CAPSULE | Freq: Once | ORAL | Status: AC
  Administered 2022-06-02: 50 mg via ORAL
  Filled 2022-06-02: qty 2

## 2022-06-02 MED ORDER — SODIUM CHLORIDE 0.9 % IV SOLN: Freq: Once | INTRAVENOUS | Status: AC

## 2022-06-02 NOTE — Patient Instructions (Signed)
[facility]  Discharge Instructions: Thank you for choosing [facility] to provide your oncology and hematology care.   If you have a lab appointment with the [facility], please go directly to the [facility] and check in at the registration area.  Wear comfortable clothing and clothing appropriate for easy access to any Portacath or PICC line.   We strive to give you quality time with your provider. You may need to reschedule your appointment if you arrive late (15 or more minutes).  Arriving late affects you and other patients whose appointments are after yours.  Also, if you miss three or more appointments without notifying the office, you may be dismissed from the clinic at the provider's discretion.      For prescription refill requests, have your pharmacy contact our office and allow 72 hours for refills to be completed.    Today you received the following chemotherapy and/or immunotherapy agents Darzales and Zometa.   To help prevent nausea and vomiting after your treatment, we encourage you to take your nausea medication as directed.  BELOW ARE SYMPTOMS THAT SHOULD BE REPORTED IMMEDIATELY: *FEVER GREATER THAN 100.4 F (38 C) OR HIGHER *CHILLS OR SWEATING *NAUSEA AND VOMITING THAT IS NOT CONTROLLED WITH YOUR NAUSEA MEDICATION *UNUSUAL SHORTNESS OF BREATH *UNUSUAL BRUISING OR BLEEDING *URINARY PROBLEMS (pain or burning when urinating, or frequent urination) *BOWEL PROBLEMS (unusual diarrhea, constipation, pain near the anus) TENDERNESS IN MOUTH AND THROAT WITH OR WITHOUT PRESENCE OF ULCERS (sore throat, sores in mouth, or a toothache) UNUSUAL RASH, SWELLING OR PAIN  UNUSUAL VAGINAL DISCHARGE OR ITCHING   Items with * indicate a potential emergency and should be followed up as soon as possible or go to the Emergency Department if any problems should occur.  Please show the CHEMOTHERAPY ALERT CARD or IMMUNOTHERAPY ALERT CARD at check-in  to the Emergency Department and triage nurse. Should you have questions after your visit or need to cancel or reschedule your appointment, please contact [facility]  [phone] and follow the prompts.  Office hours are 8:00 a.m. to 4:30 p.m. Monday - Friday. Please note that voicemails left after 4:00 p.m. may not be returned until the following business day.  We are closed weekends and major holidays. You have access to a nurse at all times for urgent questions. Please call the main number to the clinic [phone] and follow the prompts.  For any non-urgent questions, you may also contact your provider using MyChart. We now offer e-Visits for anyone 58 and older to request care online for non-urgent symptoms. For details visit [name].   Also download the MyChart app! Go to the app store, search "MyChart", open the app, select [facility], and log in with your MyChart username and password.

## 2022-06-02 NOTE — Progress Notes (Signed)
Hematology and Oncology Follow Up Visit  Robert [name] [id number] December 09, 1933 89 y.o. 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 He is doing much better. Unfortunately, both he and his wife had Covid in mid December. Their symptoms were light and they have recuperated nicely.  Last month, no M-spike was observed, IgG level was 528 mg/dL and kappa light chains 1.14 mg/dL.  No fever, chills, n/v, cough, rash, dizziness, SOB, chest pain, palpitations, abdominal pain or changes in bowel or bladder habits.  No swelling, tenderness, numbness or tingling in his extremities.  Pedal pulses are 1+.  No falls or syncope.  Appetite and hydration are good. Weight is stable at 214 lbs.   ECOG Performance Status: 1 - Symptomatic but completely ambulatory  Medications:  Allergies as of 06/02/2022   No Known Allergies      Medication List        Accurate as of June 02, 2022 10:02 AM. If you have any questions, ask your nurse or doctor.          acetaminophen-codeine 300-30 MG tablet Commonly known as: TYLENOL #3 Take 1 tablet by mouth every 8 (eight) hours as needed for moderate pain.   aspirin 81 MG tablet Take 81 mg by mouth daily.   benzonatate 100 MG capsule Commonly known as: TESSALON Take 100 mg by mouth 3 (three) times daily as needed for cough.   bimatoprost 0.01 % Soln Commonly known as: LUMIGAN Place 1 drop into both eyes at bedtime.   CALTRATE 600+D PLUS  PO Take 1 tablet by mouth daily.   carvedilol 25 MG tablet Commonly known as: COREG Take 25 mg by mouth 2 (two) times daily.   ciclopirox 0.77 % cream Commonly known as: LOPROX Apply 1 application topically 2 (two) times daily. As needed   cloNIDine 0.1 MG tablet Commonly known as: CATAPRES Take 0.1 mg by mouth 2 (two) times daily as needed (Elevated BP).   cyanocobalamin 2000 MCG tablet Take 2,000 mcg by mouth at bedtime.   diclofenac Sodium 1 % Gel Commonly known as: VOLTAREN Apply topically 4 (four) times daily. As needed   famciclovir 250 MG tablet Commonly known as: FAMVIR TAKE 1 TABLET DAILY   furosemide 20 MG tablet Commonly known as: LASIX Take 2 tablets (40 mg total) by mouth daily.   losartan 100 MG tablet Commonly known as: COZAAR Take 100 mg by mouth daily.   meloxicam 15 MG tablet Commonly known as: MOBIC Take 1 tablet by mouth daily as needed.   metolazone 5 MG tablet Commonly known as: ZAROXOLYN TAKE 1 TABLET 1 HOUR PRIOR TO TAKING LASIX DAILY   montelukast 10 MG tablet Commonly known as: SINGULAIR TAKE 1 TABLET AT BEDTIME   multivitamin with minerals Tabs tablet Take 1 tablet by mouth daily. Unknown strenght   omeprazole 20 MG capsule Commonly known as: PRILOSEC Take 1 capsule by mouth  daily before breakfast.   potassium chloride SA 20 MEQ tablet Commonly known as: KLOR-CON M Take 20 mEq by mouth daily.   rosuvastatin 20 MG tablet Commonly known as: CRESTOR Take 20 mg by mouth at bedtime.   SYSTANE OP Place 1 drop into both eyes daily as needed (dry eyes).   Vitamin D3 25 MCG (1000 UT) Caps Take 1,000 Units by mouth daily.        Allergies: No Known Allergies  Past Medical History, Surgical history, Social history, and Family History were reviewed and updated.  Review of Systems: All other 10 point review of systems is negative.   Physical Exam:  height is '5\' 11"'$  (1.803 m) and weight is 214 lb 12.8 oz (97.4 kg). His oral  temperature is 97.8 F (36.6 C). His blood pressure is 169/80 (abnormal) and his pulse is 66. His respiration is 18 and oxygen saturation is 100%.   Wt Readings from Last 3 Encounters:  06/02/22 214 lb 12.8 oz (97.4 kg)  05/01/22 211 lb (95.7 kg)  04/20/22 211 lb (95.7 kg)    Ocular: Sclerae unicteric, pupils equal, round and reactive to light Ear-nose-throat: Oropharynx clear, dentition fair Lymphatic: No cervical or supraclavicular adenopathy Lungs no rales or rhonchi, good excursion bilaterally Heart regular rate and rhythm, no murmur appreciated Abd soft, nontender, positive bowel sounds MSK no focal spinal tenderness, no joint edema Neuro: non-focal, well-oriented, appropriate affect Breasts: Deferred   Lab Results  Component Value Date   WBC 5.9 06/02/2022   HGB 11.2 (L) 06/02/2022   HCT 34.3 (L) 06/02/2022   MCV 93.5 06/02/2022   PLT 190 06/02/2022   Lab Results  Component Value Date   FERRITIN 555 (H) 07/23/2021   IRON 84 07/23/2021   TIBC 325 07/23/2021   UIBC 241 07/23/2021   IRONPCTSAT 26 07/23/2021   Lab Results  Component Value Date   RETICCTPCT 2.2 04/05/2019   RBC 3.67 (L) 06/02/2022   Lab Results  Component Value Date   KPAFRELGTCHN 11.4 04/20/2022   LAMBDASER 4.8 (L) 04/20/2022   KAPLAMBRATIO 2.38 (H) 04/20/2022   Lab Results  Component Value Date   IGGSERUM 528 (L) 04/20/2022   IGA 29 (L) 04/20/2022   IGMSERUM 51 04/20/2022   Lab Results  Component Value Date   TOTALPROTELP 5.9 (L) 04/20/2022   ALBUMINELP 3.6 04/20/2022   A1GS 0.2 04/20/2022   A2GS 0.7 04/20/2022   BETS 0.9 04/20/2022   BETA2SER 0.3 01/03/2015   GAMS 0.4 04/20/2022   MSPIKE Not Observed 04/20/2022   SPEI Comment 04/20/2022     Chemistry      Component Value Date/Time   NA 138 06/02/2022 0859   NA 137 04/08/2017 1141   NA 138 10/29/2015 1029   K 3.6 06/02/2022 0859   K 3.4 04/08/2017 1141   K 3.9 10/29/2015 1029   CL 101 06/02/2022 0859   CL 102 04/08/2017  1141   CO2 30 06/02/2022 0859   CO2 30 04/08/2017 1141   CO2 27 10/29/2015 1029   BUN 17 06/02/2022 0859   BUN 16 04/08/2017 1141   BUN 13.8 10/29/2015 1029   CREATININE 1.17 06/02/2022 0859   CREATININE 1.3 (H) 04/08/2017 1141   CREATININE 1.1 10/29/2015 1029      Component Value Date/Time   CALCIUM 9.7 06/02/2022 0859   CALCIUM 9.3 04/08/2017 1141   CALCIUM 9.3 10/29/2015 1029   ALKPHOS 48 06/02/2022 0859   ALKPHOS 66 04/08/2017 1141   ALKPHOS 52 10/29/2015 1029  AST 14 (L) 06/02/2022 0859   AST 20 10/29/2015 1029   ALT 10 06/02/2022 0859   ALT 21 04/08/2017 1141   ALT 14 10/29/2015 1029   BILITOT 0.7 06/02/2022 0859   BILITOT 0.75 10/29/2015 1029       Impression and Plan: Robert [name] is a very pleasant 89 yo African American gentleman with relapsed IgG kappa myeloma. He has history of stem cell transplant in 2006 and since relapsed.   He continues to tolerate Daratumumab nicely.  We will proceed with treatment as planned including Xgeva.  Protein studies are pending.  Follow-up in 1 month.   Lottie Dawson, NP 1/9/202410:02 AM

## 2022-06-02 NOTE — Patient Instructions (Addendum)
Zoledronic Acid Injection (Cancer) What is this medication? ZOLEDRONIC ACID (ZOE le dron ik AS id) treats high calcium levels in the blood caused by cancer. It may also be used with chemotherapy to treat weakened bones caused by cancer. It works by slowing down the release of calcium from bones. This lowers calcium levels in your blood. It also makes your bones stronger and less likely to break (fracture). It belongs to a group of medications called bisphosphonates. This medicine may be used for other purposes; ask your health care provider or pharmacist if you have questions. COMMON BRAND NAME(S): Zometa, Zometa Powder What should I tell my care team before I take this medication? They need to know if you have any of these conditions: Dehydration Dental disease Kidney disease Liver disease Low levels of calcium in the blood Lung or breathing disease, such as asthma Receiving steroids, such as dexamethasone or prednisone An unusual or allergic reaction to zoledronic acid, other medications, foods, dyes, or preservatives Pregnant or trying to get pregnant Breast-feeding How should I use this medication? This medication is injected into a vein. It is given by your care team in a hospital or clinic setting. Talk to your care team about the use of this medication in children. Special care may be needed. Overdosage: If you think you have taken too much of this medicine contact a poison control center or emergency room at once. NOTE: This medicine is only for you. Do not share this medicine with others. What if I miss a dose? Keep appointments for follow-up doses. It is important not to miss your dose. Call your care team if you are unable to keep an appointment. What may interact with this medication? Certain antibiotics given by injection Diuretics, such as bumetanide, furosemide NSAIDs, medications for pain and inflammation, such as ibuprofen or naproxen Teriparatide Thalidomide This list  may not describe all possible interactions. Give your health care provider a list of all the medicines, herbs, non-prescription drugs, or dietary supplements you use. Also tell them if you smoke, drink alcohol, or use illegal drugs. Some items may interact with your medicine. What should I watch for while using this medication? Visit your care team for regular checks on your progress. It may be some time before you see the benefit from this medication. Some people who take this medication have severe bone, joint, or muscle pain. This medication may also increase your risk for jaw problems or a broken thigh bone. Tell your care team right away if you have severe pain in your jaw, bones, joints, or muscles. Tell you care team if you have any pain that does not go away or that gets worse. Tell your dentist and dental surgeon that you are taking this medication. You should not have major dental surgery while on this medication. See your dentist to have a dental exam and fix any dental problems before starting this medication. Take good care of your teeth while on this medication. Make sure you see your dentist for regular follow-up appointments. You should make sure you get enough calcium and vitamin D while you are taking this medication. Discuss the foods you eat and the vitamins you take with your care team. Check with your care team if you have severe diarrhea, nausea, and vomiting, or if you sweat a lot. The loss of too much body fluid may make it dangerous for you to take this medication. You may need bloodwork while taking this medication. Talk to your care team if   you wish to become pregnant or think you might be pregnant. This medication can cause serious birth defects. What side effects may I notice from receiving this medication? Side effects that you should report to your care team as soon as possible: Allergic reactions--skin rash, itching, hives, swelling of the face, lips, tongue, or  throat Kidney injury--decrease in the amount of urine, swelling of the ankles, hands, or feet Low calcium level--muscle pain or cramps, confusion, tingling, or numbness in the hands or feet Osteonecrosis of the jaw--pain, swelling, or redness in the mouth, numbness of the jaw, poor healing after dental work, unusual discharge from the mouth, visible bones in the mouth Severe bone, joint, or muscle pain Side effects that usually do not require medical attention (report to your care team if they continue or are bothersome): Constipation Fatigue Fever Loss of appetite Nausea Stomach pain This list may not describe all possible side effects. Call your doctor for medical advice about side effects. You may report side effects to FDA at 1-800-FDA-1088. Where should I keep my medication? This medication is given in a hospital or clinic. It will not be stored at home. NOTE: This sheet is a summary. It may not cover all possible information. If you have questions about this medicine, talk to your doctor, pharmacist, or health care provider.  2023 Elsevier/Gold Standard (2021-06-26 00:00:00)  Daratumumab Injection What is this medication? DARATUMUMAB (dar a toom ue mab) treats multiple myeloma, a type of bone marrow cancer. It works by helping your immune system slow or stop the spread of cancer cells. It is a monoclonal antibody. This medicine may be used for other purposes; ask your health care provider or pharmacist if you have questions. COMMON BRAND NAME(S): DARZALEX What should I tell my care team before I take this medication? They need to know if you have any of these conditions: Hereditary fructose intolerance Infection, such as chickenpox, herpes, hepatitis B virus Lung or breathing disease, such as asthma, COPD An unusual or allergic reaction to daratumumab, sorbitol, other medications, foods, dyes, or preservatives Pregnant or trying to get pregnant Breast-feeding How should I use  this medication? This medication is injected into a vein. It is given by your care team in a hospital or clinic setting. Talk to your care team about the use of this medication in children. Special care may be needed. Overdosage: If you think you have taken too much of this medicine contact a poison control center or emergency room at once. NOTE: This medicine is only for you. Do not share this medicine with others. What if I miss a dose? Keep appointments for follow-up doses. It is important not to miss your dose. Call your care team if you are unable to keep an appointment. What may interact with this medication? Interactions have not been studied. This list may not describe all possible interactions. Give your health care provider a list of all the medicines, herbs, non-prescription drugs, or dietary supplements you use. Also tell them if you smoke, drink alcohol, or use illegal drugs. Some items may interact with your medicine. What should I watch for while using this medication? Your condition will be monitored carefully while you are receiving this medication. This medication can cause serious allergic reactions. To reduce your risk, your care team may give you other medication to take before receiving this one. Be sure to follow the directions from your care team. This medication can affect the results of blood tests to match your blood type.  These changes can last for up to 6 months after the final dose. Your care team will do blood tests to match your blood type before you start treatment. Tell all of your care team that you are being treated with this medication before receiving a blood transfusion. This medication can affect the results of some tests used to determine treatment response; extra tests may be needed to evaluate response. Talk to your care team if you wish to become pregnant or think you are pregnant. This medication can cause serious birth defects if taken during pregnancy and  for 3 months after the last dose. A reliable form of contraception is recommended while taking this medication and for 3 months after the last dose. Talk to your care team about effective forms of contraception. Do not breast-feed while taking this medication. What side effects may I notice from receiving this medication? Side effects that you should report to your care team as soon as possible: Allergic reactions--skin rash, itching, hives, swelling of the face, lips, tongue, or throat Infection--fever, chills, cough, sore throat, wounds that don't heal, pain or trouble when passing urine, general feeling of discomfort or being unwell Infusion reactions--chest pain, shortness of breath or trouble breathing, feeling faint or lightheaded Unusual bruising or bleeding Side effects that usually do not require medical attention (report to your care team if they continue or are bothersome): Constipation Diarrhea Fatigue Nausea Pain, tingling, or numbness in the hands or feet Swelling of the ankles, hands, or feet This list may not describe all possible side effects. Call your doctor for medical advice about side effects. You may report side effects to FDA at 1-800-FDA-1088. Where should I keep my medication? This medication is given in a hospital or clinic. It will not be stored at home. NOTE: This sheet is a summary. It may not cover all possible information. If you have questions about this medicine, talk to your doctor, pharmacist, or health care provider.  2023 Elsevier/Gold Standard (2021-09-03 00:00:00)

## 2022-06-03 ENCOUNTER — Other Ambulatory Visit: Payer: Self-pay | Admitting: Pharmacist

## 2022-06-03 LAB — KAPPA/LAMBDA LIGHT CHAINS
Kappa free light chain: 19.6 mg/L — ABNORMAL HIGH (ref 3.3–19.4)
Kappa, lambda light chain ratio: 2.11 — ABNORMAL HIGH (ref 0.26–1.65)
Lambda free light chains: 9.3 mg/L (ref 5.7–26.3)

## 2022-06-04 LAB — IGG, IGA, IGM
IgA: 29 mg/dL — ABNORMAL LOW (ref 61–437)
IgG (Immunoglobin G), Serum: 610 mg/dL (ref 603–1613)
IgM (Immunoglobulin M), Srm: 58 mg/dL (ref 15–143)

## 2022-06-05 LAB — PROTEIN ELECTROPHORESIS, SERUM
A/G Ratio: 1.7 (ref 0.7–1.7)
Albumin ELP: 3.8 g/dL (ref 2.9–4.4)
Alpha-1-Globulin: 0.1 g/dL (ref 0.0–0.4)
Alpha-2-Globulin: 0.8 g/dL (ref 0.4–1.0)
Beta Globulin: 0.7 g/dL (ref 0.7–1.3)
Gamma Globulin: 0.6 g/dL (ref 0.4–1.8)
Globulin, Total: 2.3 g/dL (ref 2.2–3.9)
Total Protein ELP: 6.1 g/dL (ref 6.0–8.5)

## 2022-06-10 ENCOUNTER — Ambulatory Visit (HOSPITAL_BASED_OUTPATIENT_CLINIC_OR_DEPARTMENT_OTHER): Payer: Medicare Other

## 2022-06-24 ENCOUNTER — Other Ambulatory Visit (HOSPITAL_BASED_OUTPATIENT_CLINIC_OR_DEPARTMENT_OTHER): Payer: Medicare Other

## 2022-07-03 ENCOUNTER — Inpatient Hospital Stay: Payer: Medicare Other

## 2022-07-03 ENCOUNTER — Encounter: Payer: Self-pay | Admitting: Hematology & Oncology

## 2022-07-03 ENCOUNTER — Other Ambulatory Visit: Payer: Self-pay

## 2022-07-03 ENCOUNTER — Inpatient Hospital Stay (HOSPITAL_BASED_OUTPATIENT_CLINIC_OR_DEPARTMENT_OTHER): Payer: Medicare Other | Admitting: Hematology & Oncology

## 2022-07-03 ENCOUNTER — Inpatient Hospital Stay: Payer: Medicare Other | Attending: Hematology & Oncology

## 2022-07-03 VITALS — BP 154/79 | HR 67 | Temp 97.6°F | Resp 20 | Ht 71.0 in | Wt 212.1 lb

## 2022-07-03 VITALS — BP 154/79 | HR 58

## 2022-07-03 DIAGNOSIS — C9000 Multiple myeloma not having achieved remission: Secondary | ICD-10-CM | POA: Diagnosis not present

## 2022-07-03 DIAGNOSIS — Z79899 Other long term (current) drug therapy: Secondary | ICD-10-CM | POA: Insufficient documentation

## 2022-07-03 DIAGNOSIS — Z5112 Encounter for antineoplastic immunotherapy: Secondary | ICD-10-CM | POA: Insufficient documentation

## 2022-07-03 DIAGNOSIS — C9002 Multiple myeloma in relapse: Secondary | ICD-10-CM | POA: Diagnosis present

## 2022-07-03 LAB — CBC WITH DIFFERENTIAL (CANCER CENTER ONLY)
Abs Immature Granulocytes: 0.02 10*3/uL (ref 0.00–0.07)
Basophils Absolute: 0 10*3/uL (ref 0.0–0.1)
Basophils Relative: 1 %
Eosinophils Absolute: 0.2 10*3/uL (ref 0.0–0.5)
Eosinophils Relative: 2 %
HCT: 34.7 % — ABNORMAL LOW (ref 39.0–52.0)
Hemoglobin: 11.2 g/dL — ABNORMAL LOW (ref 13.0–17.0)
Immature Granulocytes: 0 %
Lymphocytes Relative: 39 %
Lymphs Abs: 2.4 10*3/uL (ref 0.7–4.0)
MCH: 30.1 pg (ref 26.0–34.0)
MCHC: 32.3 g/dL (ref 30.0–36.0)
MCV: 93.3 fL (ref 80.0–100.0)
Monocytes Absolute: 0.6 10*3/uL (ref 0.1–1.0)
Monocytes Relative: 10 %
Neutro Abs: 3 10*3/uL (ref 1.7–7.7)
Neutrophils Relative %: 48 %
Platelet Count: 186 10*3/uL (ref 150–400)
RBC: 3.72 MIL/uL — ABNORMAL LOW (ref 4.22–5.81)
RDW: 13.6 % (ref 11.5–15.5)
WBC Count: 6.2 10*3/uL (ref 4.0–10.5)
nRBC: 0 % (ref 0.0–0.2)

## 2022-07-03 LAB — CMP (CANCER CENTER ONLY)
ALT: 9 U/L (ref 0–44)
AST: 14 U/L — ABNORMAL LOW (ref 15–41)
Albumin: 4.2 g/dL (ref 3.5–5.0)
Alkaline Phosphatase: 46 U/L (ref 38–126)
Anion gap: 9 (ref 5–15)
BUN: 27 mg/dL — ABNORMAL HIGH (ref 8–23)
CO2: 28 mmol/L (ref 22–32)
Calcium: 10 mg/dL (ref 8.9–10.3)
Chloride: 101 mmol/L (ref 98–111)
Creatinine: 1.23 mg/dL (ref 0.61–1.24)
GFR, Estimated: 57 mL/min — ABNORMAL LOW (ref >60)
Glucose, Bld: 108 mg/dL — ABNORMAL HIGH (ref 70–99)
Potassium: 3.5 mmol/L (ref 3.5–5.1)
Sodium: 138 mmol/L (ref 135–145)
Total Bilirubin: 0.7 mg/dL (ref 0.3–1.2)
Total Protein: 6.6 g/dL (ref 6.5–8.1)

## 2022-07-03 LAB — LACTATE DEHYDROGENASE: LDH: 192 U/L (ref 98–192)

## 2022-07-03 MED ORDER — DIPHENHYDRAMINE HCL 25 MG PO CAPS
50.0000 mg | ORAL_CAPSULE | Freq: Once | ORAL | Status: AC
  Administered 2022-07-03: 50 mg via ORAL
  Filled 2022-07-03: qty 2

## 2022-07-03 MED ORDER — ACETAMINOPHEN 325 MG PO TABS
650.0000 mg | ORAL_TABLET | Freq: Once | ORAL | Status: AC
  Administered 2022-07-03: 650 mg via ORAL
  Filled 2022-07-03: qty 2

## 2022-07-03 MED ORDER — SODIUM CHLORIDE 0.9% FLUSH
10.0000 mL | INTRAVENOUS | Status: DC | PRN
  Administered 2022-07-03: 10 mL via INTRAVENOUS

## 2022-07-03 MED ORDER — HEPARIN SOD (PORK) LOCK FLUSH 100 UNIT/ML IV SOLN
500.0000 [IU] | Freq: Once | INTRAVENOUS | Status: AC
  Administered 2022-07-03: 500 [IU] via INTRAVENOUS

## 2022-07-03 MED ORDER — DARATUMUMAB-HYALURONIDASE-FIHJ 1800-30000 MG-UT/15ML ~~LOC~~ SOLN
1800.0000 mg | Freq: Once | SUBCUTANEOUS | Status: AC
  Administered 2022-07-03: 1800 mg via SUBCUTANEOUS
  Filled 2022-07-03: qty 15

## 2022-07-03 MED ORDER — BENZONATATE 100 MG PO CAPS: 100.0000 mg | ORAL_CAPSULE | Freq: Three times a day (TID) | ORAL | 4 refills | Status: DC | PRN

## 2022-07-03 MED ORDER — DEXAMETHASONE 4 MG PO TABS
20.0000 mg | ORAL_TABLET | Freq: Once | ORAL | Status: AC
  Administered 2022-07-03: 20 mg via ORAL
  Filled 2022-07-03: qty 5

## 2022-07-03 NOTE — Patient Instructions (Signed)

## 2022-07-03 NOTE — Patient Instructions (Signed)
[facility]  Discharge Instructions: Thank you for choosing [facility] to provide your oncology and hematology care.   If you have a lab appointment with the [facility], please go directly to the [facility] and check in at the registration area.  Wear comfortable clothing and clothing appropriate for easy access to any Portacath or PICC line.   We strive to give you quality time with your provider. You may need to reschedule your appointment if you arrive late (15 or more minutes).  Arriving late affects you and other patients whose appointments are after yours.  Also, if you miss three or more appointments without notifying the office, you may be dismissed from the clinic at the provider's discretion.      For prescription refill requests, have your pharmacy contact our office and allow 72 hours for refills to be completed.    Today you received the following chemotherapy and/or immunotherapy agents faspro    To help prevent nausea and vomiting after your treatment, we encourage you to take your nausea medication as directed.  BELOW ARE SYMPTOMS THAT SHOULD BE REPORTED IMMEDIATELY: *FEVER GREATER THAN 100.4 F (38 C) OR HIGHER *CHILLS OR SWEATING *NAUSEA AND VOMITING THAT IS NOT CONTROLLED WITH YOUR NAUSEA MEDICATION *UNUSUAL SHORTNESS OF BREATH *UNUSUAL BRUISING OR BLEEDING *URINARY PROBLEMS (pain or burning when urinating, or frequent urination) *BOWEL PROBLEMS (unusual diarrhea, constipation, pain near the anus) TENDERNESS IN MOUTH AND THROAT WITH OR WITHOUT PRESENCE OF ULCERS (sore throat, sores in mouth, or a toothache) UNUSUAL RASH, SWELLING OR PAIN  UNUSUAL VAGINAL DISCHARGE OR ITCHING   Items with * indicate a potential emergency and should be followed up as soon as possible or go to the Emergency Department if any problems should occur.  Please show the CHEMOTHERAPY ALERT CARD or IMMUNOTHERAPY ALERT CARD at check-in to  the Emergency Department and triage nurse. Should you have questions after your visit or need to cancel or reschedule your appointment, please contact [facility]  [phone] and follow the prompts.  Office hours are 8:00 a.m. to 4:30 p.m. Monday - Friday. Please note that voicemails left after 4:00 p.m. may not be returned until the following business day.  We are closed weekends and major holidays. You have access to a nurse at all times for urgent questions. Please call the main number to the clinic [phone] and follow the prompts.  For any non-urgent questions, you may also contact your provider using MyChart. We now offer e-Visits for anyone 61 and older to request care online for non-urgent symptoms. For details visit [name].   Also download the MyChart app! Go to the app store, search "MyChart", open the app, select [facility], and log in with your MyChart username and password.

## 2022-07-03 NOTE — Progress Notes (Signed)
Hematology and Oncology Follow Up Visit  Robert [name] [id number] February 14, 1934 89 y.o. 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 He is doing well.  He is wife will be going to [place] next week.  They have a house there.  They usually stay for a week.  As far as the myeloma is concerned, he is doing incredibly well with this.  There is no monoclonal spike in his blood.  His IgG level is 610 mg/dL.  His Kappa light chain is 1.9 mg/dL.  He has had no problems with fever.  He did have COVID back in December.  He was on medication for this.  His wife also got COVID.  He has had no change in bowel or bladder habits.  He has had no nausea or vomiting.  He is trying to watch what he eats.  He has had no rashes.  There has been no bleeding.  He has had no headache.  Overall, his performance status is ECOG 1.   Medications:  Allergies as of 07/03/2022   No Known Allergies      Medication List        Accurate as of July 03, 2022  8:59 AM. If you have any questions, ask your nurse or doctor.          acetaminophen-codeine 300-30 MG tablet Commonly known as: TYLENOL #3 Take 1 tablet by mouth every 8 (eight) hours as needed for moderate pain.   aspirin 81 MG tablet Take 81 mg by mouth daily.   benzonatate 100 MG capsule Commonly known as: TESSALON Take 100 mg by mouth 3 (three) times daily as needed for cough.   bimatoprost 0.01 % Soln Commonly known  as: LUMIGAN Place 1 drop into both eyes at bedtime.   CALTRATE 600+D PLUS PO Take 1 tablet by mouth daily.   carvedilol 25 MG tablet Commonly known as: COREG Take 25 mg by mouth 2 (two) times daily.   ciclopirox 0.77 % cream Commonly known as: LOPROX Apply 1 application topically 2 (two) times daily. As needed   cloNIDine 0.1 MG tablet Commonly known as: CATAPRES Take 0.1 mg by mouth 2 (two) times daily as needed (Elevated BP).   cyanocobalamin 2000 MCG tablet Take 2,000 mcg by mouth at bedtime.   Darzalex 400 MG/20ML Generic drug: daratumumab every 4 weeks Intravenous   diclofenac Sodium 1 % Gel Commonly known as: VOLTAREN Apply topically 4 (four) times daily. As needed   famciclovir 250 MG tablet Commonly known as: FAMVIR TAKE 1 TABLET DAILY   Flonase Allergy Relief 50 MCG/ACT nasal spray Generic drug: fluticasone Place 1 spray into both nostrils daily.   furosemide 20 MG tablet Commonly known as: LASIX Take 2 tablets (40 mg total) by mouth daily. What changed:  when to take this reasons to take this   losartan 100 MG tablet Commonly known  as: COZAAR Take 100 mg by mouth daily.   meloxicam 15 MG tablet Commonly known as: MOBIC Take 1 tablet by mouth daily as needed.   metolazone 5 MG tablet Commonly known as: ZAROXOLYN TAKE 1 TABLET 1 HOUR PRIOR TO TAKING LASIX DAILY   montelukast 10 MG tablet Commonly known as: SINGULAIR TAKE 1 TABLET AT BEDTIME   multivitamin with minerals Tabs tablet Take 1 tablet by mouth daily. Unknown strenght   omeprazole 20 MG capsule Commonly known as: PRILOSEC Take 1 capsule by mouth daily before breakfast.   potassium chloride SA 20 MEQ tablet Commonly known as: KLOR-CON M Take 20 mEq by mouth daily.   rosuvastatin 20 MG tablet Commonly known as: CRESTOR Take 20 mg by mouth at bedtime.   SYSTANE OP Place 1 drop into both eyes daily as needed (dry eyes).   Vitamin D3 25 MCG (1000 UT) Caps Take 1,000 Units by  mouth daily.   Zoledronic Acid 4 MG/100ML IVPB Commonly known as: ZOMETA Inject 4 mg into the vein. 07/03/2022 Every 4 months.        Allergies: No Known Allergies  Past Medical History, Surgical history, Social history, and Family History were reviewed and updated.  Review of Systems: Review of Systems  Constitutional: Negative.   HENT: Negative.    Eyes: Negative.   Respiratory: Negative.    Cardiovascular: Negative.   Gastrointestinal: Negative.   Genitourinary: Negative.   Musculoskeletal:  Positive for joint pain.  Skin: Negative.   Neurological: Negative.   Endo/Heme/Allergies: Negative.   Psychiatric/Behavioral: Negative.       Physical Exam:  height is 5' 11"$  (1.803 m) and weight is 212 lb 1.9 oz (96.2 kg). His oral temperature is 97.6 F (36.4 C). His blood pressure is 154/79 (abnormal) and his pulse is 67. His respiration is 20 and oxygen saturation is 100%.   Wt Readings from Last 3 Encounters:  07/03/22 212 lb 1.9 oz (96.2 kg)  06/02/22 214 lb 12.8 oz (97.4 kg)  05/01/22 211 lb (95.7 kg)   Physical Exam Vitals reviewed.  HENT:     Head: Normocephalic and atraumatic.  Eyes:     Pupils: Pupils are equal, round, and reactive to light.  Cardiovascular:     Rate and Rhythm: Normal rate and regular rhythm.     Heart sounds: Normal heart sounds.  Pulmonary:     Effort: Pulmonary effort is normal.     Breath sounds: Normal breath sounds.  Abdominal:     General: Bowel sounds are normal.     Palpations: Abdomen is soft.  Musculoskeletal:        General: No tenderness or deformity. Normal range of motion.     Cervical back: Normal range of motion.  Lymphadenopathy:     Cervical: No cervical adenopathy.  Skin:    General: Skin is warm and dry.     Findings: No erythema or rash.  Neurological:     Mental Status: He is alert and oriented to person, place, and time.  Psychiatric:        Behavior: Behavior normal.        Thought Content: Thought content  normal.        Judgment: Judgment normal.      Lab Results  Component Value Date   WBC 6.2 07/03/2022   HGB 11.2 (L) 07/03/2022   HCT 34.7 (L) 07/03/2022   MCV 93.3 07/03/2022   PLT 186 07/03/2022   Lab Results  Component Value Date  FERRITIN 555 (H) 07/23/2021   IRON 84 07/23/2021   TIBC 325 07/23/2021   UIBC 241 07/23/2021   IRONPCTSAT 26 07/23/2021   Lab Results  Component Value Date   RETICCTPCT 2.2 04/05/2019   RBC 3.72 (L) 07/03/2022   Lab Results  Component Value Date   KPAFRELGTCHN 19.6 (H) 06/02/2022   LAMBDASER 9.3 06/02/2022   KAPLAMBRATIO 2.11 (H) 06/02/2022   Lab Results  Component Value Date   IGGSERUM 610 06/02/2022   IGA 29 (L) 06/02/2022   IGMSERUM 58 06/02/2022   Lab Results  Component Value Date   TOTALPROTELP 6.1 06/02/2022   ALBUMINELP 3.8 06/02/2022   A1GS 0.1 06/02/2022   A2GS 0.8 06/02/2022   BETS 0.7 06/02/2022   BETA2SER 0.3 01/03/2015   GAMS 0.6 06/02/2022   MSPIKE Not Observed 06/02/2022   SPEI Comment 06/02/2022     Chemistry      Component Value Date/Time   NA 138 06/02/2022 0859   NA 137 04/08/2017 1141   NA 138 10/29/2015 1029   K 3.6 06/02/2022 0859   K 3.4 04/08/2017 1141   K 3.9 10/29/2015 1029   CL 101 06/02/2022 0859   CL 102 04/08/2017 1141   CO2 30 06/02/2022 0859   CO2 30 04/08/2017 1141   CO2 27 10/29/2015 1029   BUN 17 06/02/2022 0859   BUN 16 04/08/2017 1141   BUN 13.8 10/29/2015 1029   CREATININE 1.17 06/02/2022 0859   CREATININE 1.3 (H) 04/08/2017 1141   CREATININE 1.1 10/29/2015 1029      Component Value Date/Time   CALCIUM 9.7 06/02/2022 0859   CALCIUM 9.3 04/08/2017 1141   CALCIUM 9.3 10/29/2015 1029   ALKPHOS 48 06/02/2022 0859   ALKPHOS 66 04/08/2017 1141   ALKPHOS 52 10/29/2015 1029   AST 14 (L) 06/02/2022 0859   AST 20 10/29/2015 1029   ALT 10 06/02/2022 0859   ALT 21 04/08/2017 1141   ALT 14 10/29/2015 1029   BILITOT 0.7 06/02/2022 0859   BILITOT 0.75 10/29/2015 1029        Impression and Plan: Mr. Arnn is a very pleasant 89 yo African American gentleman with relapsed IgG kappa myeloma. He has history of stem cell transplant in 2006 and since relapsed.    Again, he is really doing nicely.  I must say I am very impressed with how well he is doing on single agent daratumumab.  I saw that his last Kappa light chain was up a little bit.  We will have to watch this closely.  We will plan to get him back in another month.    Volanda Napoleon, MD 2/9/20248:59 AM

## 2022-07-05 LAB — IGG, IGA, IGM
IgA: 28 mg/dL — ABNORMAL LOW (ref 61–437)
IgG (Immunoglobin G), Serum: 654 mg/dL (ref 603–1613)
IgM (Immunoglobulin M), Srm: 56 mg/dL (ref 15–143)

## 2022-07-06 LAB — KAPPA/LAMBDA LIGHT CHAINS
Kappa free light chain: 16.1 mg/L (ref 3.3–19.4)
Kappa, lambda light chain ratio: 2.33 — ABNORMAL HIGH (ref 0.26–1.65)
Lambda free light chains: 6.9 mg/L (ref 5.7–26.3)

## 2022-07-07 LAB — PROTEIN ELECTROPHORESIS, SERUM
A/G Ratio: 1.7 (ref 0.7–1.7)
Albumin ELP: 3.8 g/dL (ref 2.9–4.4)
Alpha-1-Globulin: 0.2 g/dL (ref 0.0–0.4)
Alpha-2-Globulin: 0.7 g/dL (ref 0.4–1.0)
Beta Globulin: 0.8 g/dL (ref 0.7–1.3)
Gamma Globulin: 0.6 g/dL (ref 0.4–1.8)
Globulin, Total: 2.3 g/dL (ref 2.2–3.9)
Total Protein ELP: 6.1 g/dL (ref 6.0–8.5)

## 2022-07-10 ENCOUNTER — Encounter: Payer: Self-pay | Admitting: Family

## 2022-07-10 ENCOUNTER — Encounter: Payer: Self-pay | Admitting: Hematology & Oncology

## 2022-07-15 ENCOUNTER — Ambulatory Visit: Payer: Medicare Other | Admitting: Cardiology

## 2022-07-31 ENCOUNTER — Encounter: Payer: Self-pay | Admitting: Hematology & Oncology

## 2022-07-31 ENCOUNTER — Encounter: Payer: Self-pay | Admitting: Family

## 2022-07-31 ENCOUNTER — Inpatient Hospital Stay (HOSPITAL_BASED_OUTPATIENT_CLINIC_OR_DEPARTMENT_OTHER): Payer: Medicare Other | Admitting: Hematology & Oncology

## 2022-07-31 ENCOUNTER — Inpatient Hospital Stay: Payer: Medicare Other

## 2022-07-31 ENCOUNTER — Other Ambulatory Visit: Payer: Self-pay | Admitting: *Deleted

## 2022-07-31 ENCOUNTER — Other Ambulatory Visit (HOSPITAL_BASED_OUTPATIENT_CLINIC_OR_DEPARTMENT_OTHER): Payer: Self-pay

## 2022-07-31 ENCOUNTER — Other Ambulatory Visit: Payer: Self-pay

## 2022-07-31 ENCOUNTER — Inpatient Hospital Stay: Payer: Medicare Other | Attending: Hematology & Oncology

## 2022-07-31 VITALS — BP 165/68 | HR 61

## 2022-07-31 VITALS — BP 148/83 | HR 68 | Temp 98.3°F | Resp 18 | Ht 71.0 in | Wt 213.0 lb

## 2022-07-31 DIAGNOSIS — C9002 Multiple myeloma in relapse: Secondary | ICD-10-CM | POA: Diagnosis present

## 2022-07-31 DIAGNOSIS — C9000 Multiple myeloma not having achieved remission: Secondary | ICD-10-CM

## 2022-07-31 DIAGNOSIS — Z5112 Encounter for antineoplastic immunotherapy: Secondary | ICD-10-CM | POA: Diagnosis present

## 2022-07-31 DIAGNOSIS — D630 Anemia in neoplastic disease: Secondary | ICD-10-CM | POA: Diagnosis not present

## 2022-07-31 LAB — CBC WITH DIFFERENTIAL (CANCER CENTER ONLY)
Abs Immature Granulocytes: 0.01 10*3/uL (ref 0.00–0.07)
Basophils Absolute: 0 10*3/uL (ref 0.0–0.1)
Basophils Relative: 0 %
Eosinophils Absolute: 0.2 10*3/uL (ref 0.0–0.5)
Eosinophils Relative: 3 %
HCT: 33.6 % — ABNORMAL LOW (ref 39.0–52.0)
Hemoglobin: 11.2 g/dL — ABNORMAL LOW (ref 13.0–17.0)
Immature Granulocytes: 0 %
Lymphocytes Relative: 40 %
Lymphs Abs: 2.3 10*3/uL (ref 0.7–4.0)
MCH: 30.7 pg (ref 26.0–34.0)
MCHC: 33.3 g/dL (ref 30.0–36.0)
MCV: 92.1 fL (ref 80.0–100.0)
Monocytes Absolute: 0.5 10*3/uL (ref 0.1–1.0)
Monocytes Relative: 9 %
Neutro Abs: 2.8 10*3/uL (ref 1.7–7.7)
Neutrophils Relative %: 48 %
Platelet Count: 207 10*3/uL (ref 150–400)
RBC: 3.65 MIL/uL — ABNORMAL LOW (ref 4.22–5.81)
RDW: 13.6 % (ref 11.5–15.5)
WBC Count: 5.8 10*3/uL (ref 4.0–10.5)
nRBC: 0 % (ref 0.0–0.2)

## 2022-07-31 LAB — CMP (CANCER CENTER ONLY)
ALT: 11 U/L (ref 0–44)
AST: 16 U/L (ref 15–41)
Albumin: 4.2 g/dL (ref 3.5–5.0)
Alkaline Phosphatase: 44 U/L (ref 38–126)
Anion gap: 9 (ref 5–15)
BUN: 25 mg/dL — ABNORMAL HIGH (ref 8–23)
CO2: 29 mmol/L (ref 22–32)
Calcium: 10.2 mg/dL (ref 8.9–10.3)
Chloride: 101 mmol/L (ref 98–111)
Creatinine: 1.22 mg/dL (ref 0.61–1.24)
GFR, Estimated: 58 mL/min — ABNORMAL LOW (ref >60)
Glucose, Bld: 114 mg/dL — ABNORMAL HIGH (ref 70–99)
Potassium: 3.4 mmol/L — ABNORMAL LOW (ref 3.5–5.1)
Sodium: 139 mmol/L (ref 135–145)
Total Bilirubin: 0.8 mg/dL (ref 0.3–1.2)
Total Protein: 6.5 g/dL (ref 6.5–8.1)

## 2022-07-31 LAB — LACTATE DEHYDROGENASE: LDH: 202 U/L — ABNORMAL HIGH (ref 98–192)

## 2022-07-31 MED ORDER — DARATUMUMAB-HYALURONIDASE-FIHJ 1800-30000 MG-UT/15ML ~~LOC~~ SOLN
1800.0000 mg | Freq: Once | SUBCUTANEOUS | Status: AC
  Administered 2022-07-31: 1800 mg via SUBCUTANEOUS
  Filled 2022-07-31: qty 15

## 2022-07-31 MED ORDER — ACETAMINOPHEN 325 MG PO TABS
650.0000 mg | ORAL_TABLET | Freq: Once | ORAL | Status: AC
  Administered 2022-07-31: 650 mg via ORAL
  Filled 2022-07-31: qty 2

## 2022-07-31 MED ORDER — DEXAMETHASONE 4 MG PO TABS
ORAL_TABLET | ORAL | 2 refills | Status: DC
  Filled 2022-07-31: qty 24, fill #0

## 2022-07-31 MED ORDER — DEXAMETHASONE 4 MG PO TABS
20.0000 mg | ORAL_TABLET | Freq: Once | ORAL | Status: AC
  Administered 2022-07-31: 20 mg via ORAL
  Filled 2022-07-31: qty 5

## 2022-07-31 MED ORDER — SODIUM CHLORIDE 0.9% FLUSH
10.0000 mL | INTRAVENOUS | Status: DC | PRN
  Administered 2022-07-31: 10 mL

## 2022-07-31 MED ORDER — HEPARIN SOD (PORK) LOCK FLUSH 100 UNIT/ML IV SOLN
500.0000 [IU] | Freq: Once | INTRAVENOUS | Status: AC | PRN
  Administered 2022-07-31: 500 [IU]

## 2022-07-31 MED ORDER — DIPHENHYDRAMINE HCL 25 MG PO CAPS
50.0000 mg | ORAL_CAPSULE | Freq: Once | ORAL | Status: AC
  Administered 2022-07-31: 50 mg via ORAL
  Filled 2022-07-31: qty 2

## 2022-07-31 MED ORDER — DEXAMETHASONE 4 MG PO TABS
ORAL_TABLET | ORAL | 2 refills | Status: AC
  Filled 2022-07-31: qty 24, 30d supply, fill #0

## 2022-07-31 NOTE — Patient Instructions (Signed)
[facility]  Discharge Instructions: Thank you for choosing [facility] to provide your oncology and hematology care.   If you have a lab appointment with the Cancer Center, please go directly to the Cancer Center and check in at the registration area.  Wear comfortable clothing and clothing appropriate for easy access to any Portacath or PICC line.   We strive to give you quality time with your provider. You may need to reschedule your appointment if you arrive late (15 or more minutes).  Arriving late affects you and other patients whose appointments are after yours.  Also, if you miss three or more appointments without notifying the office, you may be dismissed from the clinic at the provider's discretion.      For prescription refill requests, have your pharmacy contact our office and allow 72 hours for refills to be completed.    Today you received the following chemotherapy and/or immunotherapy agents faspro     To help prevent nausea and vomiting after your treatment, we encourage you to take your nausea medication as directed.  BELOW ARE SYMPTOMS THAT SHOULD BE REPORTED IMMEDIATELY: *FEVER GREATER THAN 100.4 F (38 C) OR HIGHER *CHILLS OR SWEATING *NAUSEA AND VOMITING THAT IS NOT CONTROLLED WITH YOUR NAUSEA MEDICATION *UNUSUAL SHORTNESS OF BREATH *UNUSUAL BRUISING OR BLEEDING *URINARY PROBLEMS (pain or burning when urinating, or frequent urination) *BOWEL PROBLEMS (unusual diarrhea, constipation, pain near the anus) TENDERNESS IN MOUTH AND THROAT WITH OR WITHOUT PRESENCE OF ULCERS (sore throat, sores in mouth, or a toothache) UNUSUAL RASH, SWELLING OR PAIN  UNUSUAL VAGINAL DISCHARGE OR ITCHING   Items with * indicate a potential emergency and should be followed up as soon as possible or go to the Emergency Department if any problems should occur.  Please show the CHEMOTHERAPY ALERT CARD or IMMUNOTHERAPY ALERT CARD at check-in to  the Emergency Department and triage nurse. Should you have questions after your visit or need to cancel or reschedule your appointment, please contact [facility]  [phone] and follow the prompts.  Office hours are 8:00 a.m. to 4:30 p.m. Monday - Friday. Please note that voicemails left after 4:00 p.m. may not be returned until the following business day.  We are closed weekends and major holidays. You have access to a nurse at all times for urgent questions. Please call the main number to the clinic [phone] and follow the prompts.  For any non-urgent questions, you may also contact your provider using MyChart. We now offer e-Visits for anyone 18 and older to request care online for non-urgent symptoms. For details visit [email or web address].   Also download the MyChart app! Go to the app store, search "MyChart", open the app, select [facility], and log in with your MyChart username and password.   

## 2022-07-31 NOTE — Progress Notes (Signed)
Hematology and Oncology Follow Up Visit  Robert [name] [id number] [birth year] 89 y.o. 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  As always, he is doing pretty well.  He and his wife do a lot of things together.  There is such a acute couple.  He really has had no problems with treatment.  He has had no toxicity.  On his myeloma studies, there is no monoclonal spike in his blood.  His IgG level was significant for 610 mg/dL.  The Kappa light chain was 1.6 mg/dL.  He has had no fever.  He is still worried about COVID.  He had COVID back in December.  He has had no change in bowel or bladder habits.  He has had no rashes.  There has been no cough or shortness of breath.  He has a good appetite.  He has not lost weight.  He needs to lose a little bit of weight.  He is complain about pain in his knees.  There is arthritic pain.  He sees his family doctor about this.  Overall, I would say that his performance status is probably ECOG 1.    Medications:  Allergies as of 07/31/2022   No Known Allergies      Medication List        Accurate as of July 31, 2022  8:49 AM. If you have any questions, ask your nurse or doctor.          acetaminophen-codeine 300-30 MG tablet Commonly known as: TYLENOL #3 Take 1 tablet by mouth every 8 (eight) hours as needed for moderate pain.   aspirin 81 MG tablet Take 81 mg by mouth daily.   benzonatate 100 MG  capsule Commonly known as: TESSALON Take 1 capsule (100 mg total) by mouth 3 (three) times daily as needed for cough.   bimatoprost 0.01 % Soln Commonly known as: LUMIGAN Place 1 drop into both eyes at bedtime.   CALTRATE 600+D PLUS PO Take 1 tablet by mouth daily.   carvedilol 25 MG tablet Commonly known as: COREG Take 25 mg by mouth 2 (two) times daily.   ciclopirox 0.77 % cream Commonly known as: LOPROX Apply 1 application topically 2 (two) times daily. As needed   cloNIDine 0.1 MG tablet Commonly known as: CATAPRES Take 0.1 mg by mouth 2 (two) times daily as needed (Elevated BP).   cyanocobalamin 2000 MCG tablet Take 2,000 mcg by mouth at bedtime.   Darzalex 400 MG/20ML Generic drug: daratumumab every 4 weeks Intravenous   diclofenac Sodium 1 % Gel Commonly known as: VOLTAREN Apply topically 4 (four) times daily. As needed   famciclovir 250 MG tablet Commonly known as: FAMVIR TAKE 1 TABLET DAILY   Flonase Allergy Relief 50 MCG/ACT nasal spray Generic drug: fluticasone Place 1 spray into both nostrils daily.   furosemide 20 MG tablet Commonly known as:  LASIX Take 2 tablets (40 mg total) by mouth daily. What changed:  when to take this reasons to take this   losartan 100 MG tablet Commonly known as: COZAAR Take 100 mg by mouth daily.   meloxicam 15 MG tablet Commonly known as: MOBIC Take 1 tablet by mouth daily as needed.   metolazone 5 MG tablet Commonly known as: ZAROXOLYN TAKE 1 TABLET 1 HOUR PRIOR TO TAKING LASIX DAILY   montelukast 10 MG tablet Commonly known as: SINGULAIR TAKE 1 TABLET AT BEDTIME   multivitamin with minerals Tabs tablet Take 1 tablet by mouth daily. Unknown strenght   omeprazole 20 MG capsule Commonly known as: PRILOSEC Take 1 capsule by mouth daily before breakfast.   potassium chloride SA 20 MEQ tablet Commonly known as: KLOR-CON M Take 20 mEq by mouth daily.   rosuvastatin 20 MG tablet Commonly known as:  CRESTOR Take 20 mg by mouth at bedtime.   SYSTANE OP Place 1 drop into both eyes daily as needed (dry eyes).   Vitamin D3 25 MCG (1000 UT) capsule Generic drug: Cholecalciferol Take 1,000 Units by mouth daily.   Zoledronic Acid 4 MG/100ML IVPB Commonly known as: ZOMETA Inject 4 mg into the vein. 07/03/2022 Every 4 months.        Allergies: No Known Allergies  Past Medical History, Surgical history, Social history, and Family History were reviewed and updated.  Review of Systems: Review of Systems  Constitutional: Negative.   HENT: Negative.    Eyes: Negative.   Respiratory: Negative.    Cardiovascular: Negative.   Gastrointestinal: Negative.   Genitourinary: Negative.   Musculoskeletal:  Positive for joint pain.  Skin: Negative.   Neurological: Negative.   Endo/Heme/Allergies: Negative.   Psychiatric/Behavioral: Negative.       Physical Exam:  height is '5\' 11"'$  (1.803 m) and weight is 213 lb (96.6 kg). His oral temperature is 98.3 F (36.8 C). His blood pressure is 148/83 (abnormal) and his pulse is 68. His respiration is 18 and oxygen saturation is 100%.   Wt Readings from Last 3 Encounters:  07/31/22 213 lb (96.6 kg)  07/03/22 212 lb 1.9 oz (96.2 kg)  06/02/22 214 lb 12.8 oz (97.4 kg)   Physical Exam Vitals reviewed.  HENT:     Head: Normocephalic and atraumatic.  Eyes:     Pupils: Pupils are equal, round, and reactive to light.  Cardiovascular:     Rate and Rhythm: Normal rate and regular rhythm.     Heart sounds: Normal heart sounds.  Pulmonary:     Effort: Pulmonary effort is normal.     Breath sounds: Normal breath sounds.  Abdominal:     General: Bowel sounds are normal.     Palpations: Abdomen is soft.  Musculoskeletal:        General: No tenderness or deformity. Normal range of motion.     Cervical back: Normal range of motion.  Lymphadenopathy:     Cervical: No cervical adenopathy.  Skin:    General: Skin is warm and dry.     Findings: No  erythema or rash.  Neurological:     Mental Status: He is alert and oriented to person, place, and time.  Psychiatric:        Behavior: Behavior normal.        Thought Content: Thought content normal.        Judgment: Judgment normal.      Lab Results  Component Value Date   WBC 5.8 07/31/2022  HGB 11.2 (L) 07/31/2022   HCT 33.6 (L) 07/31/2022   MCV 92.1 07/31/2022   PLT 207 07/31/2022   Lab Results  Component Value Date   FERRITIN 555 (H) 07/23/2021   IRON 84 07/23/2021   TIBC 325 07/23/2021   UIBC 241 07/23/2021   IRONPCTSAT 26 07/23/2021   Lab Results  Component Value Date   RETICCTPCT 2.2 04/05/2019   RBC 3.65 (L) 07/31/2022   Lab Results  Component Value Date   KPAFRELGTCHN 16.1 07/03/2022   LAMBDASER 6.9 07/03/2022   KAPLAMBRATIO 2.33 (H) 07/03/2022   Lab Results  Component Value Date   IGGSERUM 654 07/03/2022   IGA 28 (L) 07/03/2022   IGMSERUM 56 07/03/2022   Lab Results  Component Value Date   TOTALPROTELP 6.1 07/03/2022   ALBUMINELP 3.8 07/03/2022   A1GS 0.2 07/03/2022   A2GS 0.7 07/03/2022   BETS 0.8 07/03/2022   BETA2SER 0.3 01/03/2015   GAMS 0.6 07/03/2022   MSPIKE Not Observed 07/03/2022   SPEI Comment 07/03/2022     Chemistry      Component Value Date/Time   NA 139 07/31/2022 0800   NA 137 04/08/2017 1141   NA 138 10/29/2015 1029   K 3.4 (L) 07/31/2022 0800   K 3.4 04/08/2017 1141   K 3.9 10/29/2015 1029   CL 101 07/31/2022 0800   CL 102 04/08/2017 1141   CO2 29 07/31/2022 0800   CO2 30 04/08/2017 1141   CO2 27 10/29/2015 1029   BUN 25 (H) 07/31/2022 0800   BUN 16 04/08/2017 1141   BUN 13.8 10/29/2015 1029   CREATININE 1.22 07/31/2022 0800   CREATININE 1.3 (H) 04/08/2017 1141   CREATININE 1.1 10/29/2015 1029      Component Value Date/Time   CALCIUM 10.2 07/31/2022 0800   CALCIUM 9.3 04/08/2017 1141   CALCIUM 9.3 10/29/2015 1029   ALKPHOS 44 07/31/2022 0800   ALKPHOS 66 04/08/2017 1141   ALKPHOS 52 10/29/2015 1029   AST  16 07/31/2022 0800   AST 20 10/29/2015 1029   ALT 11 07/31/2022 0800   ALT 21 04/08/2017 1141   ALT 14 10/29/2015 1029   BILITOT 0.8 07/31/2022 0800   BILITOT 0.75 10/29/2015 1029       Impression and Plan: Robert [name] is a very pleasant 89 yo African American gentleman with relapsed IgG kappa myeloma. He has history of stem cell transplant in 2006 and since relapsed.    Again, he is really doing nicely.  I must say I am very impressed with how well he is doing on single agent daratumumab.  For now, we will continue him on his daratumumab.  We will continue to follow his myeloma studies.  We will plan to get him back in 1 more month.    Volanda Napoleon, MD 3/8/20248:49 AM

## 2022-07-31 NOTE — Patient Instructions (Signed)

## 2022-08-02 LAB — IGG, IGA, IGM
IgA: 23 mg/dL — ABNORMAL LOW (ref 61–437)
IgG (Immunoglobin G), Serum: 560 mg/dL — ABNORMAL LOW (ref 603–1613)
IgM (Immunoglobulin M), Srm: 55 mg/dL (ref 15–143)

## 2022-08-03 LAB — KAPPA/LAMBDA LIGHT CHAINS
Kappa free light chain: 13.6 mg/L (ref 3.3–19.4)
Kappa, lambda light chain ratio: 1.58 (ref 0.26–1.65)
Lambda free light chains: 8.6 mg/L (ref 5.7–26.3)

## 2022-08-04 LAB — PROTEIN ELECTROPHORESIS, SERUM, WITH REFLEX
A/G Ratio: 1.5 (ref 0.7–1.7)
Albumin ELP: 3.5 g/dL (ref 2.9–4.4)
Alpha-1-Globulin: 0.2 g/dL (ref 0.0–0.4)
Alpha-2-Globulin: 0.7 g/dL (ref 0.4–1.0)
Beta Globulin: 0.9 g/dL (ref 0.7–1.3)
Gamma Globulin: 0.6 g/dL (ref 0.4–1.8)
Globulin, Total: 2.4 g/dL (ref 2.2–3.9)
Total Protein ELP: 5.9 g/dL — ABNORMAL LOW (ref 6.0–8.5)

## 2022-08-07 ENCOUNTER — Other Ambulatory Visit (HOSPITAL_BASED_OUTPATIENT_CLINIC_OR_DEPARTMENT_OTHER): Payer: Self-pay

## 2022-08-14 ENCOUNTER — Encounter: Payer: Self-pay | Admitting: Hematology & Oncology

## 2022-08-14 ENCOUNTER — Other Ambulatory Visit: Payer: Self-pay | Admitting: Hematology and Oncology

## 2022-08-14 NOTE — Progress Notes (Signed)
ON PATHWAY REGIMEN - Multiple Myeloma and Other Plasma Cell Dyscrasias  No Change  Continue With Treatment as Ordered.  Original Decision Date/Time: 09/08/2018 10:12     Cycles 1 and 2: A cycle is every 28 days:     Daratumumab    Cycles 3 through 6: A cycle is every 28 days:     Daratumumab    Cycles 7 and beyond: A cycle is every 28 days:     Daratumumab   **Always confirm dose/schedule in your pharmacy ordering system**  Patient Characteristics: Relapsed / Refractory, Second through Fourth Lines of Therapy R-ISS Staging: III Disease Classification: Relapsed Line of Therapy: Second Line Intent of Therapy: Non-Curative / Palliative Intent, Discussed with Patient

## 2022-08-21 ENCOUNTER — Other Ambulatory Visit: Payer: Self-pay | Admitting: Hematology & Oncology

## 2022-08-21 DIAGNOSIS — C9000 Multiple myeloma not having achieved remission: Secondary | ICD-10-CM

## 2022-09-03 ENCOUNTER — Inpatient Hospital Stay: Payer: Medicare Other

## 2022-09-03 ENCOUNTER — Inpatient Hospital Stay (HOSPITAL_BASED_OUTPATIENT_CLINIC_OR_DEPARTMENT_OTHER): Payer: Medicare Other | Admitting: Medical Oncology

## 2022-09-03 ENCOUNTER — Other Ambulatory Visit: Payer: Self-pay

## 2022-09-03 ENCOUNTER — Encounter: Payer: Self-pay | Admitting: Medical Oncology

## 2022-09-03 ENCOUNTER — Inpatient Hospital Stay: Payer: Medicare Other | Attending: Hematology & Oncology

## 2022-09-03 VITALS — BP 156/78 | HR 61 | Temp 98.4°F | Resp 18 | Ht 71.0 in | Wt 213.3 lb

## 2022-09-03 VITALS — BP 145/82 | HR 56

## 2022-09-03 DIAGNOSIS — D630 Anemia in neoplastic disease: Secondary | ICD-10-CM | POA: Insufficient documentation

## 2022-09-03 DIAGNOSIS — M898X9 Other specified disorders of bone, unspecified site: Secondary | ICD-10-CM

## 2022-09-03 DIAGNOSIS — M899 Disorder of bone, unspecified: Secondary | ICD-10-CM

## 2022-09-03 DIAGNOSIS — C9000 Multiple myeloma not having achieved remission: Secondary | ICD-10-CM

## 2022-09-03 DIAGNOSIS — Z5189 Encounter for other specified aftercare: Secondary | ICD-10-CM | POA: Insufficient documentation

## 2022-09-03 DIAGNOSIS — D469 Myelodysplastic syndrome, unspecified: Secondary | ICD-10-CM | POA: Insufficient documentation

## 2022-09-03 DIAGNOSIS — D631 Anemia in chronic kidney disease: Secondary | ICD-10-CM | POA: Diagnosis not present

## 2022-09-03 DIAGNOSIS — Z5112 Encounter for antineoplastic immunotherapy: Secondary | ICD-10-CM | POA: Insufficient documentation

## 2022-09-03 DIAGNOSIS — C9002 Multiple myeloma in relapse: Secondary | ICD-10-CM | POA: Diagnosis present

## 2022-09-03 DIAGNOSIS — N183 Chronic kidney disease, stage 3 unspecified: Secondary | ICD-10-CM

## 2022-09-03 LAB — CMP (CANCER CENTER ONLY)
ALT: 12 U/L (ref 0–44)
AST: 17 U/L (ref 15–41)
Albumin: 4 g/dL (ref 3.5–5.0)
Alkaline Phosphatase: 45 U/L (ref 38–126)
Anion gap: 8 (ref 5–15)
BUN: 23 mg/dL (ref 8–23)
CO2: 28 mmol/L (ref 22–32)
Calcium: 9.5 mg/dL (ref 8.9–10.3)
Chloride: 99 mmol/L (ref 98–111)
Creatinine: 1.24 mg/dL (ref 0.61–1.24)
GFR, Estimated: 57 mL/min — ABNORMAL LOW (ref >60)
Glucose, Bld: 107 mg/dL — ABNORMAL HIGH (ref 70–99)
Potassium: 3.4 mmol/L — ABNORMAL LOW (ref 3.5–5.1)
Sodium: 135 mmol/L (ref 135–145)
Total Bilirubin: 0.8 mg/dL (ref 0.3–1.2)
Total Protein: 6.2 g/dL — ABNORMAL LOW (ref 6.5–8.1)

## 2022-09-03 LAB — CBC WITH DIFFERENTIAL (CANCER CENTER ONLY)
Abs Immature Granulocytes: 0.02 10*3/uL (ref 0.00–0.07)
Basophils Absolute: 0 10*3/uL (ref 0.0–0.1)
Basophils Relative: 1 %
Eosinophils Absolute: 0.1 10*3/uL (ref 0.0–0.5)
Eosinophils Relative: 2 %
HCT: 32.5 % — ABNORMAL LOW (ref 39.0–52.0)
Hemoglobin: 10.8 g/dL — ABNORMAL LOW (ref 13.0–17.0)
Immature Granulocytes: 0 %
Lymphocytes Relative: 39 %
Lymphs Abs: 2.3 10*3/uL (ref 0.7–4.0)
MCH: 30.6 pg (ref 26.0–34.0)
MCHC: 33.2 g/dL (ref 30.0–36.0)
MCV: 92.1 fL (ref 80.0–100.0)
Monocytes Absolute: 0.5 10*3/uL (ref 0.1–1.0)
Monocytes Relative: 8 %
Neutro Abs: 3.1 10*3/uL (ref 1.7–7.7)
Neutrophils Relative %: 50 %
Platelet Count: 177 10*3/uL (ref 150–400)
RBC: 3.53 MIL/uL — ABNORMAL LOW (ref 4.22–5.81)
RDW: 13.6 % (ref 11.5–15.5)
WBC Count: 5.9 10*3/uL (ref 4.0–10.5)
nRBC: 0 % (ref 0.0–0.2)

## 2022-09-03 LAB — LACTATE DEHYDROGENASE: LDH: 223 U/L — ABNORMAL HIGH (ref 98–192)

## 2022-09-03 MED ORDER — HEPARIN SOD (PORK) LOCK FLUSH 100 UNIT/ML IV SOLN
500.0000 [IU] | Freq: Once | INTRAVENOUS | Status: AC | PRN
  Administered 2022-09-03: 500 [IU]

## 2022-09-03 MED ORDER — DIPHENHYDRAMINE HCL 25 MG PO CAPS
50.0000 mg | ORAL_CAPSULE | Freq: Once | ORAL | Status: AC
  Administered 2022-09-03: 50 mg via ORAL
  Filled 2022-09-03: qty 2

## 2022-09-03 MED ORDER — DEXAMETHASONE 4 MG PO TABS
20.0000 mg | ORAL_TABLET | Freq: Once | ORAL | Status: AC
  Administered 2022-09-03: 20 mg via ORAL
  Filled 2022-09-03: qty 5

## 2022-09-03 MED ORDER — SODIUM CHLORIDE 0.9% FLUSH
10.0000 mL | INTRAVENOUS | Status: DC | PRN
  Administered 2022-09-03: 10 mL

## 2022-09-03 MED ORDER — ACETAMINOPHEN 325 MG PO TABS
650.0000 mg | ORAL_TABLET | Freq: Once | ORAL | Status: AC
  Administered 2022-09-03: 650 mg via ORAL
  Filled 2022-09-03: qty 2

## 2022-09-03 MED ORDER — DARATUMUMAB-HYALURONIDASE-FIHJ 1800-30000 MG-UT/15ML ~~LOC~~ SOLN
1800.0000 mg | Freq: Once | SUBCUTANEOUS | Status: AC
  Administered 2022-09-03: 1800 mg via SUBCUTANEOUS
  Filled 2022-09-03: qty 15

## 2022-09-03 NOTE — Progress Notes (Signed)
Hematology and Oncology Follow Up Visit  Robert [name] [id number] May 23, 1934 89 y.o. 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   He really has had no problems with treatment.  He has had no toxicity.  On his myeloma studies, there is no monoclonal spike in his blood.  His IgG level was significant for 560 mg/dL.  Kappa light chain also trending down. These have trended down over time.   He has had no fever.  He had COVID-19 back in December and still has a slight remaining cough and mild SOB. Has had negative follow up chest x ray imaging. He uses Tessalon.   Chronic right knee pain. Has follow up with ortho next week I believe.   He has had no change in bowel or bladder habits.  He has had no rashes.  There has been no cough or shortness of breath.  He has a good appetite.  He has not lost weight.  He needs to lose a little bit of weight.  He is complain about pain in his knees.  There is arthritic pain.  He sees his family doctor about this.  Overall, I would say that his performance status is probably ECOG 1.    Wt Readings from Last 3 Encounters:  09/03/22 213 lb 4.8 oz (96.8 kg)  07/31/22 213 lb (96.6 kg)  07/03/22 212 lb 1.9 oz (96.2 kg)     Medications:  Allergies as of 09/03/2022   No Known Allergies      Medication List        Accurate as of September 03, 2022 10:12 AM. If you have any questions, ask your nurse or doctor.           acetaminophen-codeine 300-30 MG tablet Commonly known as: TYLENOL #3 Take 1 tablet by mouth every 8 (eight) hours as needed for moderate pain.   aspirin 81 MG tablet Take 81 mg by mouth daily.   benzonatate 100 MG capsule Commonly known as: TESSALON Take 1 capsule (100 mg total) by mouth 3 (three) times daily as needed for cough.   bimatoprost 0.01 % Soln Commonly known as: LUMIGAN Place 1 drop into both eyes at bedtime.   CALTRATE 600+D PLUS PO Take 1 tablet by mouth daily.   carvedilol 25 MG tablet Commonly known as: COREG Take 25 mg by mouth 2 (two) times daily.   ciclopirox 0.77 % cream Commonly known as: LOPROX Apply 1 application topically 2 (two) times daily. As needed   cloNIDine 0.1 MG tablet Commonly known as: CATAPRES Take 0.1 mg by mouth 2 (two) times daily as needed (Elevated BP).   cyanocobalamin 2000 MCG tablet Take 2,000 mcg by mouth at bedtime.   Darzalex 400 MG/20ML Generic drug: daratumumab every 4 weeks Intravenous   dexamethasone 4 MG tablet Commonly known as: DECADRON Take 5 tablets prior to immunotherapy treatment  diclofenac Sodium 1 % Gel Commonly known as: VOLTAREN Apply topically 4 (four) times daily. As needed   famciclovir 250 MG tablet Commonly known as: FAMVIR TAKE 1 TABLET DAILY   Flonase Allergy Relief 50 MCG/ACT nasal spray Generic drug: fluticasone Place 1 spray into both nostrils daily.   furosemide 20 MG tablet Commonly known as: LASIX Take 2 tablets (40 mg total) by mouth daily. What changed:  when to take this reasons to take this   losartan 100 MG tablet Commonly known as: COZAAR Take 100 mg by mouth daily.   meloxicam 15 MG tablet Commonly known as: MOBIC Take 1 tablet by mouth daily as needed.   metolazone 5 MG tablet Commonly known as: ZAROXOLYN TAKE 1 TABLET 1 HOUR PRIOR TO TAKING LASIX DAILY   montelukast 10 MG tablet Commonly known as: SINGULAIR TAKE 1 TABLET AT BEDTIME   multivitamin  with minerals Tabs tablet Take 1 tablet by mouth daily. Unknown strenght   omeprazole 20 MG capsule Commonly known as: PRILOSEC Take 1 capsule by mouth daily before breakfast.   potassium chloride SA 20 MEQ tablet Commonly known as: KLOR-CON M Take 20 mEq by mouth daily.   rosuvastatin 20 MG tablet Commonly known as: CRESTOR Take 20 mg by mouth at bedtime.   SYSTANE OP Place 1 drop into both eyes daily as needed (dry eyes).   Vitamin D3 25 MCG (1000 UT) capsule Generic drug: Cholecalciferol Take 1,000 Units by mouth daily.   Zoledronic Acid 4 MG/100ML IVPB Commonly known as: ZOMETA Inject 4 mg into the vein. 07/03/2022 Every 4 months.        Allergies: No Known Allergies  Past Medical History, Surgical history, Social history, and Family History were reviewed and updated.  Review of Systems: Review of Systems  Constitutional: Negative.   HENT: Negative.    Eyes: Negative.   Respiratory: Negative.    Cardiovascular: Negative.   Gastrointestinal: Negative.   Genitourinary: Negative.   Musculoskeletal:  Positive for joint pain.  Skin: Negative.   Neurological: Negative.   Endo/Heme/Allergies: Negative.   Psychiatric/Behavioral: Negative.     Physical Exam:  height is 5\' 11"  (1.803 m) and weight is 213 lb 4.8 oz (96.8 kg). His oral temperature is 98.4 F (36.9 C). His blood pressure is 156/78 (abnormal) and his pulse is 61. His respiration is 18 and oxygen saturation is 100%.   Wt Readings from Last 3 Encounters:  09/03/22 213 lb 4.8 oz (96.8 kg)  07/31/22 213 lb (96.6 kg)  07/03/22 212 lb 1.9 oz (96.2 kg)   Physical Exam Vitals reviewed.  Constitutional:      Comments: Ambulating slowly   HENT:     Head: Normocephalic and atraumatic.  Eyes:     Pupils: Pupils are equal, round, and reactive to light.  Cardiovascular:     Rate and Rhythm: Normal rate and regular rhythm.     Heart sounds: Normal heart sounds.  Pulmonary:     Effort: Pulmonary effort is  normal.     Breath sounds: Normal breath sounds.  Musculoskeletal:        General: No tenderness or deformity.     Cervical back: Normal range of motion.  Lymphadenopathy:     Cervical: No cervical adenopathy.  Skin:    General: Skin is warm and dry.     Findings: No erythema or rash.  Neurological:     Mental Status: He is alert and oriented to person, place, and time.  Psychiatric:  Behavior: Behavior normal.        Thought Content: Thought content normal.        Judgment: Judgment normal.      Lab Results  Component Value Date   WBC 5.9 09/03/2022   HGB 10.8 (L) 09/03/2022   HCT 32.5 (L) 09/03/2022   MCV 92.1 09/03/2022   PLT 177 09/03/2022   Lab Results  Component Value Date   FERRITIN 555 (H) 07/23/2021   IRON 84 07/23/2021   TIBC 325 07/23/2021   UIBC 241 07/23/2021   IRONPCTSAT 26 07/23/2021   Lab Results  Component Value Date   RETICCTPCT 2.2 04/05/2019   RBC 3.53 (L) 09/03/2022   Lab Results  Component Value Date   KPAFRELGTCHN 13.6 07/31/2022   LAMBDASER 8.6 07/31/2022   KAPLAMBRATIO 1.58 07/31/2022   Lab Results  Component Value Date   IGGSERUM 560 (L) 07/31/2022   IGA 23 (L) 07/31/2022   IGMSERUM 55 07/31/2022   Lab Results  Component Value Date   TOTALPROTELP 5.9 (L) 07/31/2022   ALBUMINELP 3.5 07/31/2022   A1GS 0.2 07/31/2022   A2GS 0.7 07/31/2022   BETS 0.9 07/31/2022   BETA2SER 0.3 01/03/2015   GAMS 0.6 07/31/2022   MSPIKE Not Observed 07/31/2022   SPEI Comment 07/03/2022     Chemistry      Component Value Date/Time   NA 135 09/03/2022 0938   NA 137 04/08/2017 1141   NA 138 10/29/2015 1029   K 3.4 (L) 09/03/2022 0938   K 3.4 04/08/2017 1141   K 3.9 10/29/2015 1029   CL 99 09/03/2022 0938   CL 102 04/08/2017 1141   CO2 28 09/03/2022 0938   CO2 30 04/08/2017 1141   CO2 27 10/29/2015 1029   BUN 23 09/03/2022 0938   BUN 16 04/08/2017 1141   BUN 13.8 10/29/2015 1029   CREATININE 1.24 09/03/2022 0938   CREATININE 1.3  (H) 04/08/2017 1141   CREATININE 1.1 10/29/2015 1029      Component Value Date/Time   CALCIUM 9.5 09/03/2022 0938   CALCIUM 9.3 04/08/2017 1141   CALCIUM 9.3 10/29/2015 1029   ALKPHOS 45 09/03/2022 0938   ALKPHOS 66 04/08/2017 1141   ALKPHOS 52 10/29/2015 1029   AST 17 09/03/2022 0938   AST 20 10/29/2015 1029   ALT 12 09/03/2022 0938   ALT 21 04/08/2017 1141   ALT 14 10/29/2015 1029   BILITOT 0.8 09/03/2022 0938   BILITOT 0.75 10/29/2015 1029      Encounter Diagnoses  Name Primary?   IgG multiple myeloma    Erythropoietin deficiency anemia Yes    Impression and Plan: Mr. Bryson HaKee is a very pleasant 89 yo African American gentleman with relapsed IgG kappa myeloma. He has history of stem cell transplant in 2006 and since relapsed.    Currently on single agent daratumumab which he tolerates well. He has chronic mild anemia which waxes and wanes in the 10-11 range. Today Hgb is 10.8. Platelets are normal at 177 and ANC is 3.1. CMP is within treatment parameters. Myeloma labs pending but have been stable. He is asymptomatic.   For now, we will continue him on his daratumumab.  We will continue to follow his myeloma studies.  RTC 1 month MD, labs, Daratumumab    Rushie ChestnutSarah M Akshara Blumenthal, [place] JerseyPA-C 4/11/202410:12 AM

## 2022-09-03 NOTE — Patient Instructions (Signed)
[facility]  Discharge Instructions: Thank you for choosing [facility] to provide your oncology and hematology care.   If you have a lab appointment with the Cancer Center, please go directly to the Cancer Center and check in at the registration area.  Wear comfortable clothing and clothing appropriate for easy access to any Portacath or PICC line.   We strive to give you quality time with your provider. You may need to reschedule your appointment if you arrive late (15 or more minutes).  Arriving late affects you and other patients whose appointments are after yours.  Also, if you miss three or more appointments without notifying the office, you may be dismissed from the clinic at the provider's discretion.      For prescription refill requests, have your pharmacy contact our office and allow 72 hours for refills to be completed.    Today you received the following chemotherapy and/or immunotherapy agents darzalex      To help prevent nausea and vomiting after your treatment, we encourage you to take your nausea medication as directed.  BELOW ARE SYMPTOMS THAT SHOULD BE REPORTED IMMEDIATELY: *FEVER GREATER THAN 100.4 F (38 C) OR HIGHER *CHILLS OR SWEATING *NAUSEA AND VOMITING THAT IS NOT CONTROLLED WITH YOUR NAUSEA MEDICATION *UNUSUAL SHORTNESS OF BREATH *UNUSUAL BRUISING OR BLEEDING *URINARY PROBLEMS (pain or burning when urinating, or frequent urination) *BOWEL PROBLEMS (unusual diarrhea, constipation, pain near the anus) TENDERNESS IN MOUTH AND THROAT WITH OR WITHOUT PRESENCE OF ULCERS (sore throat, sores in mouth, or a toothache) UNUSUAL RASH, SWELLING OR PAIN  UNUSUAL VAGINAL DISCHARGE OR ITCHING   Items with * indicate a potential emergency and should be followed up as soon as possible or go to the Emergency Department if any problems should occur.  Please show the CHEMOTHERAPY ALERT CARD or IMMUNOTHERAPY ALERT CARD at check-in  to the Emergency Department and triage nurse. Should you have questions after your visit or need to cancel or reschedule your appointment, please contact [facility]  [phone] and follow the prompts.  Office hours are 8:00 a.m. to 4:30 p.m. Monday - Friday. Please note that voicemails left after 4:00 p.m. may not be returned until the following business day.  We are closed weekends and major holidays. You have access to a nurse at all times for urgent questions. Please call the main number to the clinic [phone] and follow the prompts.  For any non-urgent questions, you may also contact your provider using MyChart. We now offer e-Visits for anyone 22 and older to request care online for non-urgent symptoms. For details visit [email or web address].   Also download the MyChart app! Go to the app store, search "MyChart", open the app, select , and log in with your MyChart username and password.

## 2022-09-03 NOTE — Patient Instructions (Signed)

## 2022-09-04 ENCOUNTER — Other Ambulatory Visit: Payer: Self-pay | Admitting: Hematology and Oncology

## 2022-09-04 LAB — KAPPA/LAMBDA LIGHT CHAINS
Kappa free light chain: 13 mg/L (ref 3.3–19.4)
Kappa, lambda light chain ratio: 1.94 — ABNORMAL HIGH (ref 0.26–1.65)
Lambda free light chains: 6.7 mg/L (ref 5.7–26.3)

## 2022-09-05 LAB — IGG, IGA, IGM
IgA: 23 mg/dL — ABNORMAL LOW (ref 61–437)
IgG (Immunoglobin G), Serum: 547 mg/dL — ABNORMAL LOW (ref 603–1613)
IgM (Immunoglobulin M), Srm: 50 mg/dL (ref 15–143)

## 2022-09-07 ENCOUNTER — Encounter: Payer: Self-pay | Admitting: Hematology & Oncology

## 2022-09-07 ENCOUNTER — Telehealth: Payer: Self-pay | Admitting: *Deleted

## 2022-09-07 ENCOUNTER — Encounter: Payer: Self-pay | Admitting: Family

## 2022-09-07 LAB — PROTEIN ELECTROPHORESIS, SERUM, WITH REFLEX
A/G Ratio: 1.7 (ref 0.7–1.7)
Albumin ELP: 3.7 g/dL (ref 2.9–4.4)
Alpha-1-Globulin: 0.2 g/dL (ref 0.0–0.4)
Alpha-2-Globulin: 0.9 g/dL (ref 0.4–1.0)
Beta Globulin: 0.7 g/dL (ref 0.7–1.3)
Gamma Globulin: 0.5 g/dL (ref 0.4–1.8)
Globulin, Total: 2.2 g/dL (ref 2.2–3.9)
Total Protein ELP: 5.9 g/dL — ABNORMAL LOW (ref 6.0–8.5)

## 2022-09-07 NOTE — Telephone Encounter (Signed)
Per 09/03/22 los - called patient abd lvm of upcoming appointments - requested call back to confirm

## 2022-09-08 ENCOUNTER — Other Ambulatory Visit: Payer: Self-pay

## 2022-09-09 ENCOUNTER — Other Ambulatory Visit: Payer: Self-pay

## 2022-09-11 ENCOUNTER — Ambulatory Visit: Payer: Medicare Other | Admitting: Cardiology

## 2022-09-17 ENCOUNTER — Ambulatory Visit (INDEPENDENT_AMBULATORY_CARE_PROVIDER_SITE_OTHER): Payer: Medicare Other | Admitting: Podiatry

## 2022-09-17 DIAGNOSIS — R600 Localized edema: Secondary | ICD-10-CM | POA: Diagnosis not present

## 2022-09-17 DIAGNOSIS — M21372 Foot drop, left foot: Secondary | ICD-10-CM

## 2022-09-17 DIAGNOSIS — G8929 Other chronic pain: Secondary | ICD-10-CM

## 2022-09-17 DIAGNOSIS — M25561 Pain in right knee: Secondary | ICD-10-CM

## 2022-09-17 NOTE — Progress Notes (Signed)
Subjective:  Patient ID: Robert [name], male    DOB: [birth year],  MRN: [id number]  Chief Complaint  Patient presents with   Foot Pain    Patient thinks that he has drop foot. Edema concerns.     89 y.o. male presents with concern for inability to raise his left foot.  Primary care told him he may have a foot drop.  He states he has seen this problem started within the last couple months or so.  Also has noticed a lot of swelling in his bilateral lower extremity.  He is also having right knee pain.  Has previously had injection for that which helped and is hoping to get another 1.  He denies any acute injuries to his left leg foot or ankle.  Just notices that he is not able to move the left foot up is much as he used to be able to.  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   Glaucoma of both eyes 10/10/2019   Goals of care, counseling/discussion 05/12/2018   Groin lump 12/28/2016   Hyperlipidemia    Hypertension    IgG multiple myeloma (HCC) 07/23/2004   Iron deficiency anemia 10/07/2018   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   Lytic bone lesions on xray 03/17/2016   Multiple myeloma (HCC)    2006   Multiple myeloma in remission (HCC) 03/19/2010   Myeloma (HCC) 07/02/2011   S/P autologous bone marrow transplantation (HCC) 04/29/2005   Swelling of both lower extremities 10/18/2018   Syncope 12/24/2017   White coat syndrome with diagnosis of hypertension 11/28/2015    No Known Allergies  ROS: Negative except as per HPI above  Objective:  General: AAO x3, NAD  Dermatological: With inspection and palpation of the right and left lower extremities there are no open sores, no preulcerative lesions,  no rash or signs of infection present. Nails are of normal length thickness and coloration.   Vascular:  Dorsalis Pedis artery and Posterior Tibial artery pedal pulses are 2/4 bilateral.  Capillary fill time < 3 sec to all digits.   Neruologic: Grossly intact via light touch bilateral. Protective threshold intact to all sites bilateral.   Musculoskeletal: Significant decrease in left lower extremity ankle dorsiflexion strength approximately 1-2/5 with significant reduction in amount of dorsiflexion available.  Gait: Assisted with cane No images are attached to the encounter.  Radiographs:  Deferred Assessment:   1. Left foot drop   2. Chronic pain of right knee   3. Edema of lower extremity      Plan:  Patient was evaluated and treated and all questions answered.  # Dropfoot of left lower extremity -Discussed with the patient he does have evidence of having dropfoot though it is unclear what is causing this. -Recommend referral to neurology to further elucidate his potential source of this issue. -I also recommend using a dorsiflexor assist AFO device for the patient.  Patient was provided with a written prescription to [facility] to have a custom AFO with dorsiflexor assist made for him  # Edema bilateral lower extremity -Recommend compression stocking and elevation therapy  # Chronic right knee pain -Referral to orthopedics placed for right knee pain patient may benefit from steroid  injection.  Return if symptoms worsen or fail to improve.          Corinna Gab, DPM [facility] / [facility]

## 2022-09-29 ENCOUNTER — Telehealth: Payer: Self-pay

## 2022-09-29 ENCOUNTER — Ambulatory Visit (INDEPENDENT_AMBULATORY_CARE_PROVIDER_SITE_OTHER): Payer: Medicare Other | Admitting: Physician Assistant

## 2022-09-29 ENCOUNTER — Other Ambulatory Visit (INDEPENDENT_AMBULATORY_CARE_PROVIDER_SITE_OTHER): Payer: Medicare Other

## 2022-09-29 DIAGNOSIS — M1712 Unilateral primary osteoarthritis, left knee: Secondary | ICD-10-CM

## 2022-09-29 DIAGNOSIS — M1711 Unilateral primary osteoarthritis, right knee: Secondary | ICD-10-CM | POA: Diagnosis not present

## 2022-09-29 MED ORDER — LIDOCAINE HCL 1 % IJ SOLN
2.0000 mL | INTRAMUSCULAR | Status: AC | PRN
Start: 2022-09-29 — End: 2022-09-29
  Administered 2022-09-29: 2 mL

## 2022-09-29 MED ORDER — METHYLPREDNISOLONE ACETATE 40 MG/ML IJ SUSP
40.0000 mg | INTRAMUSCULAR | Status: AC | PRN
Start: 2022-09-29 — End: 2022-09-29
  Administered 2022-09-29: 40 mg via INTRA_ARTICULAR

## 2022-09-29 MED ORDER — BUPIVACAINE HCL 0.25 % IJ SOLN
2.0000 mL | INTRAMUSCULAR | Status: AC | PRN
Start: 2022-09-29 — End: 2022-09-29
  Administered 2022-09-29: 2 mL via INTRA_ARTICULAR

## 2022-09-29 NOTE — Telephone Encounter (Signed)
Please precert for right knee gel injection. Dr.Xu's patient. Thanks!  

## 2022-09-29 NOTE — Progress Notes (Signed)
Office Visit Note   Patient: Robert [name]           Date of Birth: Jan 27, 1934           MRN: [id number] Visit Date: 09/29/2022              Requested by: Garlan Fillers, MD [address] [place],  [place] [address] PCP: Garlan Fillers, MD   Assessment & Plan: Visit Diagnoses:  1. Primary osteoarthritis of left knee   2. Unilateral primary osteoarthritis, right knee     Plan: Impression is right knee osteoarthritis.  Today we discussed various treatment options to include repeat cortisone injection versus viscosupplementation injection.  He would like to proceed with cortisone injection today and get approval for viscosupplementation injection.  Follow-up with [place] once approved.  Follow-Up Instructions: Return if symptoms worsen or fail to improve.   Orders:  Orders Placed This Encounter  Procedures   Large Joint Inj   XR KNEE 3 VIEW LEFT   XR KNEE 3 VIEW RIGHT   No orders of the defined types were placed in this encounter.     Procedures: Large Joint Inj: R knee on 09/29/2022 3:43 PM Indications: pain Details: 22 G needle, anterolateral approach Medications: 2 mL lidocaine 1 %; 2 mL bupivacaine 0.25 %; 40 mg methylPREDNISolone acetate 40 MG/ML      Clinical Data: No additional findings.   Subjective: Chief Complaint  Patient presents with   Left Knee - Pain   Right Knee - Pain    HPI patient is a very pleasant 89 year old gentleman who comes in today with chronic right knee pain.  History of osteoarthritis.  He has been seen by [place] for this a few times last year where cortisone injection was performed.  Injection provided temporary relief.  He was approved for viscosupplementation injection but was busy and unable to follow-up to proceed with this.  The pain he is having is to the entire knee worse with with activity and at night.  He has been taking Tylenol without significant relief.  Review of Systems as detailed in HPI.  All others reviewed and  negative.   Objective: Vital Signs: There were no vitals taken for this visit.  Physical Exam well-developed well-nourished gentleman in no acute distress.  Alert and oriented x 3.  Ortho Exam right knee exam shows no effusion.  Range of motion 0 to 120 degrees.  No joint line tenderness.  No patellofemoral crepitus.  He is neurovascular intact distally.  Specialty Comments:  No specialty comments available.  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   Goals of care, counseling/discussion 05/12/2018   Syncope 12/24/2017   Groin lump 12/28/2016   Lytic bone lesions on xray 03/17/2016   Cerumen debris on tympanic membrane 11/28/2015   White coat syndrome with diagnosis of hypertension 11/28/2015   Dyslipidemia 04/08/2015   Myeloma (HCC) 07/02/2011  ED (erectile dysfunction) 06/02/2010   Benign essential hypertension 03/19/2010   Multiple myeloma in remission (HCC) 03/19/2010   B12 deficiency 02/17/2010   S/P autologous bone marrow transplantation (HCC) 04/29/2005   IgG multiple myeloma (HCC) 07/23/2004   Past Medical History:  Diagnosis Date   Anemia of chronic renal failure, stage 3 (moderate) (HCC) 10/06/2018   Arthritis    B12 deficiency 02/17/2010   Benign essential hypertension 03/19/2010   Cerumen debris on tympanic membrane 11/28/2015   Dyslipidemia 04/08/2015   ED (erectile  dysfunction) 06/02/2010   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   Glaucoma of both eyes 10/10/2019   Goals of care, counseling/discussion 05/12/2018   Groin lump 12/28/2016   Hyperlipidemia    Hypertension    IgG multiple myeloma (HCC) 07/23/2004   Iron deficiency anemia 10/07/2018   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   Lytic bone lesions on xray 03/17/2016   Multiple myeloma (HCC)    2006   Multiple myeloma in remission (HCC) 03/19/2010   Myeloma (HCC) 07/02/2011   S/P autologous bone marrow transplantation (HCC) 04/29/2005   Swelling of both lower extremities 10/18/2018   Syncope 12/24/2017   White coat syndrome with diagnosis of hypertension 11/28/2015    Family History  Problem Relation Age of Onset   Heart attack Father    Heart disease Father    Throat cancer Mother    Diabetes Brother    Colon cancer Neg Hx     Past Surgical History:  Procedure Laterality Date   CATARACT EXTRACTION BILATERAL W/ ANTERIOR VITRECTOMY     IR IMAGING GUIDED PORT INSERTION  05/30/2018   LIMBAL STEM CELL TRANSPLANT     Social History   Occupational History   Not on file  Tobacco Use   Smoking status: Former    Packs/day: 4.00    Years: 9.00    Additional pack years: 0.00    Total pack years: 36.00    Types: Cigars, Cigarettes    Start date: 07/08/1979    Quit date: 07/07/1988    Years since quitting: 34.2   Smokeless tobacco: Never   Tobacco comments:    quit 24 years ago  Vaping Use   Vaping Use: Never used  Substance and Sexual Activity   Alcohol use: Yes    Alcohol/week: 6.0 standard drinks of alcohol    Types: 6 Glasses of wine per week    Comment: very seldom   Drug use: No   Sexual activity: Not Currently

## 2022-09-30 ENCOUNTER — Encounter: Payer: Self-pay | Admitting: Neurology

## 2022-09-30 ENCOUNTER — Telehealth: Payer: Self-pay | Admitting: Cardiology

## 2022-09-30 MED ORDER — FUROSEMIDE 20 MG PO TABS: 40.0000 mg | ORAL_TABLET | Freq: Every day | ORAL | 1 refills | Status: DC

## 2022-09-30 NOTE — Telephone Encounter (Signed)
Rx refill sent to pharmacy. 

## 2022-09-30 NOTE — Telephone Encounter (Signed)
*  STAT* If patient is at the pharmacy, call can be transferred to refill team.   1. Which medications need to be refilled? (please list name of each medication and dose if known) furosemide (LASIX) 20 MG tablet   2. Which pharmacy/location (including street and city if local pharmacy) is medication to be sent to? [facility] [phone] - [place], [place] - [address]   3. Do they need a 30 day or 90 day supply? 90

## 2022-09-30 NOTE — Telephone Encounter (Signed)
VOB submitted for Monovisc, right knee  

## 2022-10-01 ENCOUNTER — Other Ambulatory Visit: Payer: Self-pay

## 2022-10-05 ENCOUNTER — Other Ambulatory Visit: Payer: Self-pay

## 2022-10-05 ENCOUNTER — Inpatient Hospital Stay: Payer: Medicare Other | Attending: Hematology & Oncology

## 2022-10-05 ENCOUNTER — Inpatient Hospital Stay: Payer: Medicare Other

## 2022-10-05 ENCOUNTER — Inpatient Hospital Stay (HOSPITAL_BASED_OUTPATIENT_CLINIC_OR_DEPARTMENT_OTHER): Payer: Medicare Other | Admitting: Family

## 2022-10-05 ENCOUNTER — Encounter: Payer: Self-pay | Admitting: Family

## 2022-10-05 VITALS — BP 142/77 | HR 63 | Temp 97.4°F | Resp 17 | Wt 209.0 lb

## 2022-10-05 DIAGNOSIS — M898X9 Other specified disorders of bone, unspecified site: Secondary | ICD-10-CM

## 2022-10-05 DIAGNOSIS — Z5112 Encounter for antineoplastic immunotherapy: Secondary | ICD-10-CM | POA: Insufficient documentation

## 2022-10-05 DIAGNOSIS — C9000 Multiple myeloma not having achieved remission: Secondary | ICD-10-CM

## 2022-10-05 DIAGNOSIS — M899 Disorder of bone, unspecified: Secondary | ICD-10-CM

## 2022-10-05 DIAGNOSIS — N183 Chronic kidney disease, stage 3 unspecified: Secondary | ICD-10-CM | POA: Diagnosis not present

## 2022-10-05 DIAGNOSIS — D630 Anemia in neoplastic disease: Secondary | ICD-10-CM | POA: Diagnosis not present

## 2022-10-05 DIAGNOSIS — C9002 Multiple myeloma in relapse: Secondary | ICD-10-CM | POA: Insufficient documentation

## 2022-10-05 DIAGNOSIS — D631 Anemia in chronic kidney disease: Secondary | ICD-10-CM

## 2022-10-05 LAB — CMP (CANCER CENTER ONLY)
ALT: 12 U/L (ref 0–44)
AST: 14 U/L — ABNORMAL LOW (ref 15–41)
Albumin: 4.3 g/dL (ref 3.5–5.0)
Alkaline Phosphatase: 42 U/L (ref 38–126)
Anion gap: 4 — ABNORMAL LOW (ref 5–15)
BUN: 32 mg/dL — ABNORMAL HIGH (ref 8–23)
CO2: 33 mmol/L — ABNORMAL HIGH (ref 22–32)
Calcium: 10.8 mg/dL — ABNORMAL HIGH (ref 8.9–10.3)
Chloride: 98 mmol/L (ref 98–111)
Creatinine: 1.41 mg/dL — ABNORMAL HIGH (ref 0.61–1.24)
GFR, Estimated: 49 mL/min — ABNORMAL LOW (ref >60)
Glucose, Bld: 134 mg/dL — ABNORMAL HIGH (ref 70–99)
Potassium: 3.1 mmol/L — ABNORMAL LOW (ref 3.5–5.1)
Sodium: 135 mmol/L (ref 135–145)
Total Bilirubin: 0.8 mg/dL (ref 0.3–1.2)
Total Protein: 6.4 g/dL — ABNORMAL LOW (ref 6.5–8.1)

## 2022-10-05 LAB — CBC WITH DIFFERENTIAL (CANCER CENTER ONLY)
Abs Immature Granulocytes: 0.02 10*3/uL (ref 0.00–0.07)
Basophils Absolute: 0 10*3/uL (ref 0.0–0.1)
Basophils Relative: 0 %
Eosinophils Absolute: 0.2 10*3/uL (ref 0.0–0.5)
Eosinophils Relative: 2 %
HCT: 37 % — ABNORMAL LOW (ref 39.0–52.0)
Hemoglobin: 12.1 g/dL — ABNORMAL LOW (ref 13.0–17.0)
Immature Granulocytes: 0 %
Lymphocytes Relative: 34 %
Lymphs Abs: 2.6 10*3/uL (ref 0.7–4.0)
MCH: 30.2 pg (ref 26.0–34.0)
MCHC: 32.7 g/dL (ref 30.0–36.0)
MCV: 92.3 fL (ref 80.0–100.0)
Monocytes Absolute: 0.7 10*3/uL (ref 0.1–1.0)
Monocytes Relative: 8 %
Neutro Abs: 4.3 10*3/uL (ref 1.7–7.7)
Neutrophils Relative %: 56 %
Platelet Count: 201 10*3/uL (ref 150–400)
RBC: 4.01 MIL/uL — ABNORMAL LOW (ref 4.22–5.81)
RDW: 13.5 % (ref 11.5–15.5)
WBC Count: 7.8 10*3/uL (ref 4.0–10.5)
nRBC: 0 % (ref 0.0–0.2)

## 2022-10-05 LAB — LACTATE DEHYDROGENASE: LDH: 195 U/L — ABNORMAL HIGH (ref 98–192)

## 2022-10-05 MED ORDER — SODIUM CHLORIDE 0.9% FLUSH
10.0000 mL | INTRAVENOUS | Status: DC | PRN
  Administered 2022-10-05: 10 mL

## 2022-10-05 MED ORDER — DARATUMUMAB-HYALURONIDASE-FIHJ 1800-30000 MG-UT/15ML ~~LOC~~ SOLN
1800.0000 mg | Freq: Once | SUBCUTANEOUS | Status: AC
  Administered 2022-10-05: 1800 mg via SUBCUTANEOUS
  Filled 2022-10-05: qty 15

## 2022-10-05 MED ORDER — ACETAMINOPHEN 325 MG PO TABS
650.0000 mg | ORAL_TABLET | Freq: Once | ORAL | Status: AC
  Administered 2022-10-05: 650 mg via ORAL
  Filled 2022-10-05: qty 2

## 2022-10-05 MED ORDER — HEPARIN SOD (PORK) LOCK FLUSH 100 UNIT/ML IV SOLN
500.0000 [IU] | Freq: Once | INTRAVENOUS | Status: AC | PRN
  Administered 2022-10-05: 500 [IU]

## 2022-10-05 MED ORDER — POTASSIUM CHLORIDE CRYS ER 20 MEQ PO TBCR
20.0000 meq | EXTENDED_RELEASE_TABLET | Freq: Every day | ORAL | 3 refills | Status: AC
Start: 2022-10-05 — End: ?

## 2022-10-05 MED ORDER — DIPHENHYDRAMINE HCL 25 MG PO CAPS
50.0000 mg | ORAL_CAPSULE | Freq: Once | ORAL | Status: AC
  Administered 2022-10-05: 50 mg via ORAL
  Filled 2022-10-05: qty 2

## 2022-10-05 MED ORDER — DEXAMETHASONE 4 MG PO TABS
20.0000 mg | ORAL_TABLET | Freq: Once | ORAL | Status: AC
  Administered 2022-10-05: 20 mg via ORAL
  Filled 2022-10-05: qty 5

## 2022-10-05 MED ORDER — SODIUM CHLORIDE 0.9 % IV SOLN: INTRAVENOUS | Status: DC

## 2022-10-05 MED ORDER — ZOLEDRONIC ACID 4 MG/100ML IV SOLN
4.0000 mg | Freq: Once | INTRAVENOUS | Status: AC
  Administered 2022-10-05: 4 mg via INTRAVENOUS
  Filled 2022-10-05: qty 100

## 2022-10-05 NOTE — Progress Notes (Signed)
Hematology and Oncology Follow Up Visit  Robert [name] [id number] 08/04/1933 89 y.o. 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 He is doing well but has had arthritis pain in his knees as well as a new diagnosis of foot drop in the left foot. He has an appointment with a neurologist in early June for further evaluation and treatment.  He has not had any recent issue with infection.  No fever, chills, n/v, rash, dizziness, SOB, chest pain, palpitations, abdominal pain or changes in bowel or bladder habits.  He notes the occasional cough with clear phlegm.  No M-spike observed last month. IgG level was 547 mg/dL and kappa light chains were 1.30 mg/dL.  No swelling in his extremities at this time.  No falls or syncope.  Appetite and hydration are good. Weight is stable at 209 lbs.   ECOG Performance Status: 1 - Symptomatic but completely ambulatory  Medications:  Allergies as of 10/05/2022   No Known Allergies      Medication List        Accurate as of Oct 05, 2022  8:45 AM. If you have any questions, ask your nurse or doctor.          acetaminophen-codeine 300-30 MG tablet Commonly known as: TYLENOL #3 Take 1 tablet by mouth every 8 (eight) hours as needed for moderate pain.   aspirin 81 MG tablet Take 81 mg by mouth daily.   benzonatate 100 MG capsule Commonly known as: TESSALON Take 1 capsule (100 mg total) by mouth 3 (three) times daily as  needed for cough.   bimatoprost 0.01 % Soln Commonly known as: LUMIGAN Place 1 drop into both eyes at bedtime.   CALTRATE 600+D PLUS PO Take 1 tablet by mouth daily.   carvedilol 25 MG tablet Commonly known as: COREG Take 25 mg by mouth 2 (two) times daily.   ciclopirox 0.77 % cream Commonly known as: LOPROX Apply 1 application topically 2 (two) times daily. As needed   cloNIDine 0.1 MG tablet Commonly known as: CATAPRES Take 0.1 mg by mouth 2 (two) times daily as needed (Elevated BP).   cyanocobalamin 2000 MCG tablet Take 2,000 mcg by mouth at bedtime.   Darzalex 400 MG/20ML Generic drug: daratumumab every 4 weeks Intravenous   dexamethasone 4 MG tablet Commonly known as: DECADRON Take 5 tablets prior to immunotherapy treatment   diclofenac Sodium 1 % Gel Commonly known as: VOLTAREN Apply topically 4 (four) times daily. As needed   famciclovir 250 MG tablet Commonly known as: FAMVIR TAKE 1 TABLET DAILY   Flonase Allergy Relief 50 MCG/ACT nasal spray Generic drug: fluticasone Place 1 spray into both nostrils daily.   furosemide 20 MG tablet Commonly known as: LASIX Take 2 tablets (40 mg total) by mouth daily.   losartan 100 MG tablet Commonly known as: COZAAR Take 100 mg by mouth daily.  meloxicam 15 MG tablet Commonly known as: MOBIC Take 1 tablet by mouth daily as needed.   metolazone 5 MG tablet Commonly known as: ZAROXOLYN TAKE 1 TABLET 1 HOUR PRIOR TO TAKING LASIX DAILY   montelukast 10 MG tablet Commonly known as: SINGULAIR TAKE 1 TABLET AT BEDTIME   multivitamin with minerals Tabs tablet Take 1 tablet by mouth daily. Unknown strenght   omeprazole 20 MG capsule Commonly known as: PRILOSEC Take 1 capsule by mouth daily before breakfast.   potassium chloride SA 20 MEQ tablet Commonly known as: KLOR-CON M Take 20 mEq by mouth daily.   rosuvastatin 20 MG tablet Commonly known as: CRESTOR Take 20 mg by mouth at bedtime.   SYSTANE  OP Place 1 drop into both eyes daily as needed (dry eyes).   Vitamin D3 25 MCG (1000 UT) Caps Take 1,000 Units by mouth daily.   Zoledronic Acid 4 MG/100ML IVPB Commonly known as: ZOMETA Inject 4 mg into the vein. 07/03/2022 Every 4 months.        Allergies: No Known Allergies  Past Medical History, Surgical history, Social history, and Family History were reviewed and updated.  Review of Systems: All other 10 point review of systems is negative.   Physical Exam:  weight is 209 lb (94.8 kg). His oral temperature is 97.4 F (36.3 C) (abnormal). His blood pressure is 142/77 (abnormal) and his pulse is 63. His respiration is 17 and oxygen saturation is 100%.   Wt Readings from Last 3 Encounters:  10/05/22 209 lb (94.8 kg)  09/03/22 213 lb 4.8 oz (96.8 kg)  07/31/22 213 lb (96.6 kg)    Ocular: Sclerae unicteric, pupils equal, round and reactive to light Ear-nose-throat: Oropharynx clear, dentition fair Lymphatic: No cervical or supraclavicular adenopathy Lungs no rales or rhonchi, good excursion bilaterally Heart regular rate and rhythm, no murmur appreciated Abd soft, nontender, positive bowel sounds MSK no focal spinal tenderness, no joint edema Neuro: non-focal, well-oriented, appropriate affect Breasts: Deferred   Lab Results  Component Value Date   WBC 5.9 09/03/2022   HGB 10.8 (L) 09/03/2022   HCT 32.5 (L) 09/03/2022   MCV 92.1 09/03/2022   PLT 177 09/03/2022   Lab Results  Component Value Date   FERRITIN 555 (H) 07/23/2021   IRON 84 07/23/2021   TIBC 325 07/23/2021   UIBC 241 07/23/2021   IRONPCTSAT 26 07/23/2021   Lab Results  Component Value Date   RETICCTPCT 2.2 04/05/2019   RBC 3.53 (L) 09/03/2022   Lab Results  Component Value Date   KPAFRELGTCHN 13.0 09/03/2022   LAMBDASER 6.7 09/03/2022   KAPLAMBRATIO 1.94 (H) 09/03/2022   Lab Results  Component Value Date   IGGSERUM 547 (L) 09/03/2022   IGA 23 (L) 09/03/2022   IGMSERUM 50 09/03/2022    Lab Results  Component Value Date   TOTALPROTELP 5.9 (L) 09/03/2022   ALBUMINELP 3.7 09/03/2022   A1GS 0.2 09/03/2022   A2GS 0.9 09/03/2022   BETS 0.7 09/03/2022   BETA2SER 0.3 01/03/2015   GAMS 0.5 09/03/2022   MSPIKE Not Observed 09/03/2022   SPEI Comment 07/03/2022     Chemistry      Component Value Date/Time   NA 135 09/03/2022 0938   NA 137 04/08/2017 1141   NA 138 10/29/2015 1029   K 3.4 (L) 09/03/2022 0938   K 3.4 04/08/2017 1141   K 3.9 10/29/2015 1029   CL 99 09/03/2022 0938   CL 102 04/08/2017 1141   CO2 28 09/03/2022  0938   CO2 30 04/08/2017 1141   CO2 27 10/29/2015 1029   BUN 23 09/03/2022 0938   BUN 16 04/08/2017 1141   BUN 13.8 10/29/2015 1029   CREATININE 1.24 09/03/2022 0938   CREATININE 1.3 (H) 04/08/2017 1141   CREATININE 1.1 10/29/2015 1029      Component Value Date/Time   CALCIUM 9.5 09/03/2022 0938   CALCIUM 9.3 04/08/2017 1141   CALCIUM 9.3 10/29/2015 1029   ALKPHOS 45 09/03/2022 0938   ALKPHOS 66 04/08/2017 1141   ALKPHOS 52 10/29/2015 1029   AST 17 09/03/2022 0938   AST 20 10/29/2015 1029   ALT 12 09/03/2022 0938   ALT 21 04/08/2017 1141   ALT 14 10/29/2015 1029   BILITOT 0.8 09/03/2022 0938   BILITOT 0.75 10/29/2015 1029       Impression and Plan: Robert [name] is a very pleasant 89 yo African American gentleman with relapsed IgG kappa myeloma. He has history of stem cell transplant in 2006 and since relapsed.   He continues to tolerate Daratumumab nicely. We will proceed with treatment today as planned along with Zometa.  He will restart his KDUR 20 MEQ daily for the potassium of 3.1.  Follow-up in 1 month.   Eileen Stanford, NP 5/13/20248:45 AM

## 2022-10-05 NOTE — Patient Instructions (Addendum)
[facility]  Discharge Instructions: Thank you for choosing [facility] to provide your oncology and hematology care.   If you have a lab appointment with the Cancer Center, please go directly to the Cancer Center and check in at the registration area.  Wear comfortable clothing and clothing appropriate for easy access to any Portacath or PICC line.   We strive to give you quality time with your provider. You may need to reschedule your appointment if you arrive late (15 or more minutes).  Arriving late affects you and other patients whose appointments are after yours.  Also, if you miss three or more appointments without notifying the office, you may be dismissed from the clinic at the provider's discretion.      For prescription refill requests, have your pharmacy contact our office and allow 72 hours for refills to be completed.    Today you received the following chemotherapy and/or immunotherapy agents Darzalex, Zometa      To help prevent nausea and vomiting after your treatment, we encourage you to take your nausea medication as directed.  BELOW ARE SYMPTOMS THAT SHOULD BE REPORTED IMMEDIATELY: *FEVER GREATER THAN 100.4 F (38 C) OR HIGHER *CHILLS OR SWEATING *NAUSEA AND VOMITING THAT IS NOT CONTROLLED WITH YOUR NAUSEA MEDICATION *UNUSUAL SHORTNESS OF BREATH *UNUSUAL BRUISING OR BLEEDING *URINARY PROBLEMS (pain or burning when urinating, or frequent urination) *BOWEL PROBLEMS (unusual diarrhea, constipation, pain near the anus) TENDERNESS IN MOUTH AND THROAT WITH OR WITHOUT PRESENCE OF ULCERS (sore throat, sores in mouth, or a toothache) UNUSUAL RASH, SWELLING OR PAIN  UNUSUAL VAGINAL DISCHARGE OR ITCHING   Items with * indicate a potential emergency and should be followed up as soon as possible or go to the Emergency Department if any problems should occur.  Please show the CHEMOTHERAPY ALERT CARD or IMMUNOTHERAPY ALERT CARD at  check-in to the Emergency Department and triage nurse. Should you have questions after your visit or need to cancel or reschedule your appointment, please contact [facility] [place]  [phone] and follow the prompts.  Office hours are 8:00 a.m. to 4:30 p.m. Monday - Friday. Please note that voicemails left after 4:00 p.m. may not be returned until the following business day.  We are closed weekends and major holidays. You have access to a nurse at all times for urgent questions. Please call the main number to the clinic [phone] and follow the prompts.  For any non-urgent questions, you may also contact your provider using MyChart. We now offer e-Visits for anyone 74 and older to request care online for non-urgent symptoms. For details visit [email or web address].   Also download the MyChart app! Go to the app store, search "MyChart", open the app, select [facility], and log in with your MyChart username and password.

## 2022-10-06 LAB — KAPPA/LAMBDA LIGHT CHAINS
Kappa free light chain: 14.1 mg/L (ref 3.3–19.4)
Kappa, lambda light chain ratio: 1.76 — ABNORMAL HIGH (ref 0.26–1.65)
Lambda free light chains: 8 mg/L (ref 5.7–26.3)

## 2022-10-07 ENCOUNTER — Other Ambulatory Visit: Payer: Self-pay

## 2022-10-07 LAB — IGG, IGA, IGM
IgA: 24 mg/dL — ABNORMAL LOW (ref 61–437)
IgG (Immunoglobin G), Serum: 559 mg/dL — ABNORMAL LOW (ref 603–1613)
IgM (Immunoglobulin M), Srm: 54 mg/dL (ref 15–143)

## 2022-10-08 LAB — PROTEIN ELECTROPHORESIS, SERUM
A/G Ratio: 1.5 (ref 0.7–1.7)
Albumin ELP: 3.6 g/dL (ref 2.9–4.4)
Alpha-1-Globulin: 0.2 g/dL (ref 0.0–0.4)
Alpha-2-Globulin: 0.8 g/dL (ref 0.4–1.0)
Beta Globulin: 0.9 g/dL (ref 0.7–1.3)
Gamma Globulin: 0.5 g/dL (ref 0.4–1.8)
Globulin, Total: 2.4 g/dL (ref 2.2–3.9)
Total Protein ELP: 6 g/dL (ref 6.0–8.5)

## 2022-10-20 ENCOUNTER — Encounter: Payer: Self-pay | Admitting: Cardiology

## 2022-10-20 ENCOUNTER — Ambulatory Visit: Payer: Medicare Other | Attending: Cardiology | Admitting: Cardiology

## 2022-10-20 VITALS — BP 138/72 | HR 74 | Ht 71.0 in | Wt 212.0 lb

## 2022-10-20 DIAGNOSIS — I10 Essential (primary) hypertension: Secondary | ICD-10-CM | POA: Diagnosis present

## 2022-10-20 DIAGNOSIS — Z9481 Bone marrow transplant status: Secondary | ICD-10-CM | POA: Insufficient documentation

## 2022-10-20 DIAGNOSIS — E785 Hyperlipidemia, unspecified: Secondary | ICD-10-CM | POA: Diagnosis present

## 2022-10-20 NOTE — Patient Instructions (Signed)

## 2022-10-20 NOTE — Progress Notes (Signed)
Cardiology Office Note:    Date:  10/20/2022   ID:  Robert [name], DOB 01/05/1934, MRN [id number]  PCP:  Garlan Fillers, MD  Cardiologist:  Gypsy Balsam, MD    Referring MD: Garlan Fillers, MD   Chief Complaint  Patient presents with   Follow-up    History of Present Illness:    Robert [name] is a 89 y.o. male with past medical history significant for multiple myeloma initially diagnosed in 2016, status post bone marrow transplant, whitecoat hypertension.  Comes today to months for follow-up.  Overall doing well cardiac wise denies of any palpitation chest pain tightness squeezing pressure burning chest.  He does have probably the right knee and he is scheduled to have MRI done Surgery may be required.  He does have a lot of pain he also was find to have left foot drop.  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   Glaucoma of both eyes 10/10/2019   Goals of care, counseling/discussion 05/12/2018   Groin lump 12/28/2016   Hyperlipidemia    Hypertension    IgG multiple myeloma (HCC) 07/23/2004   Iron deficiency anemia 10/07/2018   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   Lytic bone lesions on xray 03/17/2016   Multiple myeloma (HCC)    2006   Multiple myeloma in remission (HCC) 03/19/2010   Myeloma (HCC) 07/02/2011   S/P autologous bone marrow transplantation (HCC) 04/29/2005   Swelling of both lower extremities 10/18/2018   Syncope 12/24/2017   White coat syndrome with diagnosis of hypertension 11/28/2015    Past Surgical History:  Procedure Laterality Date   CATARACT EXTRACTION BILATERAL W/ ANTERIOR VITRECTOMY     IR IMAGING GUIDED PORT INSERTION  05/30/2018   LIMBAL STEM CELL  TRANSPLANT      Current Medications: Current Meds  Medication Sig   acetaminophen-codeine (TYLENOL #3) 300-30 MG tablet Take 1 tablet by mouth every 8 (eight) hours as needed for moderate pain.   aspirin 81 MG tablet Take 81 mg by mouth daily.   benzonatate (TESSALON) 100 MG capsule Take 1 capsule (100 mg total) by mouth 3 (three) times daily as needed for cough.   bimatoprost (LUMIGAN) 0.01 % SOLN Place 1 drop into both eyes at bedtime.    Calcium Carbonate-Vit D-Min (CALTRATE 600+D PLUS PO) Take 1 tablet by mouth daily.   carvedilol (COREG) 25 MG tablet Take 25 mg by mouth 2 (two) times daily.   Cholecalciferol (VITAMIN D3) 1000 UNITS CAPS Take 1,000 Units by mouth daily.    ciclopirox (LOPROX) 0.77 % cream Apply 1 application topically 2 (two) times daily. As needed   cloNIDine (CATAPRES) 0.1 MG tablet Take 0.1 mg by mouth 2 (two) times daily as needed (Elevated BP).   cyanocobalamin 2000 MCG tablet Take 2,000 mcg by mouth at bedtime.    daratumumab (DARZALEX) 400 MG/20ML Inject 20 mLs into the vein every 30 (thirty) days.   dexamethasone (DECADRON) 4 MG tablet Take 5 tablets prior to immunotherapy treatment (Patient taking differently: Take 4 mg by mouth See admin instructions. Take 5 tablets prior to immunotherapy treatment)   diclofenac Sodium (VOLTAREN) 1 % GEL Apply 2 g topically 4 (four) times daily. As needed  famciclovir (FAMVIR) 250 MG tablet TAKE 1 TABLET DAILY (Patient taking differently: Take 250 mg by mouth daily.)   fluticasone (FLONASE ALLERGY RELIEF) 50 MCG/ACT nasal spray Place 1 spray into both nostrils daily.   furosemide (LASIX) 20 MG tablet Take 2 tablets (40 mg total) by mouth daily.   losartan (COZAAR) 100 MG tablet Take 100 mg by mouth daily.   meloxicam (MOBIC) 15 MG tablet Take 1 tablet by mouth daily as needed for pain.   metolazone (ZAROXOLYN) 5 MG tablet TAKE 1 TABLET 1 HOUR PRIOR TO TAKING LASIX DAILY (Patient taking differently: Take 5 mg by mouth daily.  TAKE 1 TABLET 1 HOUR PRIOR TO TAKING LASIX DAILY)   montelukast (SINGULAIR) 10 MG tablet TAKE 1 TABLET AT BEDTIME (Patient taking differently: Take 10 mg by mouth at bedtime.)   Multiple Vitamin (MULITIVITAMIN WITH MINERALS) TABS Take 1 tablet by mouth daily. Unknown strenght   omeprazole (PRILOSEC) 20 MG capsule Take 1 capsule by mouth daily before breakfast.   Polyethyl Glycol-Propyl Glycol (SYSTANE OP) Place 1 drop into both eyes daily as needed (dry eyes).   potassium chloride SA (KLOR-CON M) 20 MEQ tablet Take 1 tablet (20 mEq total) by mouth daily.   rosuvastatin (CRESTOR) 20 MG tablet Take 20 mg by mouth at bedtime.   Zoledronic Acid (ZOMETA) 4 MG/100ML IVPB Inject 4 mg into the vein every 30 (thirty) days. 07/03/2022 Every 4 months.     Allergies:   Patient has no known allergies.   Social History   Socioeconomic History   Marital status: Married    Spouse name: Not on file   Number of children: Not on file   Years of education: Not on file   Highest education level: Not on file  Occupational History   Not on file  Tobacco Use   Smoking status: Former    Packs/day: 4.00    Years: 9.00    Additional pack years: 0.00    Total pack years: 36.00    Types: Cigars, Cigarettes    Start date: 07/08/1979    Quit date: 07/07/1988    Years since quitting: 34.3   Smokeless tobacco: Never   Tobacco comments:    quit 24 years ago  Vaping Use   Vaping Use: Never used  Substance and Sexual Activity   Alcohol use: Yes    Alcohol/week: 6.0 standard drinks of alcohol    Types: 6 Glasses of wine per week    Comment: very seldom   Drug use: No   Sexual activity: Not Currently  Other Topics Concern   Not on file  Social History Narrative   Not on file   Social Determinants of Health   Financial Resource Strain: Not on file  Food Insecurity: Not on file  Transportation Needs: Not on file  Physical Activity: Not on file  Stress: Not on file  Social Connections: Not on file      Family History: The patient's family history includes Diabetes in his brother; Heart attack in his father; Heart disease in his father; Throat cancer in his mother. There is no history of Colon cancer. ROS:   Please see the history of present illness.    All 14 point review of systems negative except as described per history of present illness  EKGs/Labs/Other Studies Reviewed:      Recent Labs: 10/05/2022: ALT 12; BUN 32; Creatinine 1.41; Hemoglobin 12.1; Platelet Count 201; Potassium 3.1; Sodium 135  Recent Lipid Panel No results found for: "CHOL", "  TRIG", "HDL", "CHOLHDL", "VLDL", "LDLCALC", "LDLDIRECT"  Physical Exam:    VS:  BP 138/72 (BP Location: Left Arm, Patient Position: Sitting)   Pulse 74   Ht 5\' 11"  (1.803 m)   Wt 212 lb (96.2 kg)   SpO2 97%   BMI 29.57 kg/m     Wt Readings from Last 3 Encounters:  10/20/22 212 lb (96.2 kg)  10/05/22 209 lb (94.8 kg)  09/03/22 213 lb 4.8 oz (96.8 kg)     GEN:  Well nourished, well developed in no acute distress HEENT: Normal NECK: No JVD; No carotid bruits LYMPHATICS: No lymphadenopathy CARDIAC: RRR, no murmurs, no rubs, no gallops RESPIRATORY:  Clear to auscultation without rales, wheezing or rhonchi  ABDOMEN: Soft, non-tender, non-distended MUSCULOSKELETAL:  No edema; No deformity  SKIN: Warm and dry LOWER EXTREMITIES: no swelling NEUROLOGIC:  Alert and oriented x 3 PSYCHIATRIC:  Normal affect   ASSESSMENT:    1. S/P autologous bone marrow transplantation (HCC)   2. White coat syndrome with diagnosis of hypertension   3. Benign essential hypertension   4. Dyslipidemia    PLAN:    In order of problems listed above:  Essential hypertension well-controlled continue present management. Status post bone marrow transplant secondary to multiple myeloma follow-up excellently goal is by our oncology team. Manage dyslipidemia.  Will continue present management.   Medication Adjustments/Labs and Tests  Ordered: Current medicines are reviewed at length with the patient today.  Concerns regarding medicines are outlined above.  No orders of the defined types were placed in this encounter.  Medication changes: No orders of the defined types were placed in this encounter.   Signed, Georgeanna Lea, MD, [facility] 10/20/2022 2:45 PM    [facility]

## 2022-10-29 NOTE — Progress Notes (Signed)
Initial neurology clinic note  Reason for Evaluation: Consultation requested by Robert [name]* for an opinion regarding left foot drop. My final recommendations will be communicated back to the requesting physician by way of shared medical record or letter to requesting physician via [place] mail.  HPI: This is [name], a 89 y.o. right-handed male with a medical history of HTN, HLD, B12 deficiency, OA, multiple myeloma s/p BM transplant, iron deficiency anemia, glaucoma, CKD 3 who presents to neurology clinic with the chief complaint of left foot drop. The patient is alone today.  Patient has left foot drop. He is not sure how long it has been present. It was noticed by podiatry in 08/2022. Patient has been wearing the AFO for a couple of weeks. He denies any tripping or falls. He denies any numbness or tingling. Patient did have swelling in his legs, but is now on lasix and leg swelling has improved. He denies any injuries. He denies any back pain. He denies crossing his legs.  Patient denies any previous focal weakness such as other foot drop or wrist drop, etc.  Patient does not take B12 supplement regularly.  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 He has history of stem cell transplant in 2006 and since relapsed.   He continues to tolerate Daratumumab nicely. We will proceed with  treatment today as planned along with Zometa.  He will restart his KDUR 20 MEQ daily for the potassium of 3.1.   The patient has not noticed any recent skin rashes nor does he report any constitutional symptoms like fever, night sweats, anorexia or unintentional weight loss.  EtOH use: very seldom  Restrictive diet? No Family history of neuropathy/myopathy/neurologic disease? No   MEDICATIONS:  Outpatient Encounter Medications as of 11/05/2022  Medication Sig   acetaminophen-codeine (TYLENOL #3) 300-30 MG tablet Take 1 tablet by mouth every 8 (eight) hours as needed for moderate pain.   aspirin 81 MG tablet Take 81 mg by mouth daily.   benzonatate (TESSALON) 100 MG capsule Take 1 capsule (100 mg total) by mouth 3 (three) times daily as needed for cough.   bimatoprost (LUMIGAN) 0.01 % SOLN Place 1 drop into both eyes at bedtime.    Calcium Carbonate-Vit D-Min (CALTRATE 600+D PLUS PO) Take 1 tablet by mouth daily.   carvedilol (COREG) 25 MG tablet Take 25 mg by mouth 2 (two) times daily.   Cholecalciferol (VITAMIN D3) 1000 UNITS CAPS Take 1,000 Units by mouth daily.    ciclopirox (LOPROX) 0.77 % cream Apply 1 application topically 2 (two) times daily. As needed   cloNIDine (CATAPRES) 0.1 MG tablet Take 0.1 mg by mouth 2 (two) times daily as needed (Elevated BP).   cyanocobalamin 2000 MCG tablet Take 2,000 mcg by mouth at bedtime.    daratumumab (DARZALEX) 400 MG/20ML Inject  20 mLs into the vein every 30 (thirty) days.   dexamethasone (DECADRON) 4 MG tablet Take 5 tablets prior to immunotherapy treatment (Patient taking differently: Take 4 mg by mouth See admin instructions. Take 5 tablets prior to immunotherapy treatment)   diclofenac Sodium (VOLTAREN) 1 % GEL Apply 2 g topically 4 (four) times daily. As needed   famciclovir (FAMVIR) 250 MG tablet TAKE 1 TABLET DAILY (Patient taking differently: Take 250 mg by mouth daily.)   fluticasone (FLONASE ALLERGY RELIEF) 50 MCG/ACT nasal spray Place 1  spray into both nostrils daily.   furosemide (LASIX) 20 MG tablet Take 2 tablets (40 mg total) by mouth daily.   losartan (COZAAR) 100 MG tablet Take 100 mg by mouth daily.   meloxicam (MOBIC) 15 MG tablet Take 1 tablet by mouth daily as needed for pain.   metolazone (ZAROXOLYN) 5 MG tablet TAKE 1 TABLET 1 HOUR PRIOR TO TAKING LASIX DAILY (Patient taking differently: Take 5 mg by mouth daily. TAKE 1 TABLET 1 HOUR PRIOR TO TAKING LASIX DAILY)   montelukast (SINGULAIR) 10 MG tablet TAKE 1 TABLET AT BEDTIME (Patient taking differently: Take 10 mg by mouth at bedtime.)   Multiple Vitamin (MULITIVITAMIN WITH MINERALS) TABS Take 1 tablet by mouth daily. Unknown strenght   omeprazole (PRILOSEC) 20 MG capsule Take 1 capsule by mouth daily before breakfast.   Polyethyl Glycol-Propyl Glycol (SYSTANE OP) Place 1 drop into both eyes daily as needed (dry eyes).   potassium chloride SA (KLOR-CON M) 20 MEQ tablet Take 1 tablet (20 mEq total) by mouth daily.   rosuvastatin (CRESTOR) 20 MG tablet Take 20 mg by mouth at bedtime.   Zoledronic Acid (ZOMETA) 4 MG/100ML IVPB Inject 4 mg into the vein every 30 (thirty) days. 07/03/2022 Every 4 months.   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   Glaucoma of both eyes 10/10/2019   Goals of care, counseling/discussion 05/12/2018   Groin lump 12/28/2016   Hyperlipidemia    Hypertension    IgG multiple myeloma (HCC)  07/23/2004   Iron deficiency anemia 10/07/2018   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   Lytic bone lesions on xray 03/17/2016   Multiple myeloma (HCC)    2006   Multiple myeloma in remission (HCC) 03/19/2010   Myeloma (HCC) 07/02/2011   S/P autologous bone marrow transplantation (HCC) 04/29/2005   Swelling of both lower extremities 10/18/2018   Syncope 12/24/2017   White coat syndrome with diagnosis of hypertension 11/28/2015    PAST SURGICAL HISTORY: Past Surgical History:  Procedure Laterality Date   CATARACT EXTRACTION BILATERAL W/ ANTERIOR VITRECTOMY     IR IMAGING GUIDED PORT INSERTION  05/30/2018   LIMBAL STEM CELL TRANSPLANT      ALLERGIES: No Known Allergies  FAMILY HISTORY: Family History  Problem Relation Age of Onset   Heart attack Father    Heart disease Father    Throat cancer Mother    Diabetes Brother  Colon cancer Neg Hx     SOCIAL HISTORY: Social History   Tobacco Use   Smoking status: Former    Packs/day: 4.00    Years: 9.00    Additional pack years: 0.00    Total pack years: 36.00    Types: Cigars, Cigarettes    Start date: 07/08/1979    Quit date: 07/07/1988    Years since quitting: 34.3   Smokeless tobacco: Never   Tobacco comments:    quit 24 years ago  Vaping Use   Vaping Use: Never used  Substance Use Topics   Alcohol use: Yes    Alcohol/week: 6.0 standard drinks of alcohol    Types: 6 Glasses of wine per week    Comment: very seldom   Drug use: No   Social History   Social History Narrative   Not on file     OBJECTIVE: PHYSICAL EXAM: BP (!) 200/99   Pulse 67   Ht 5\' 11"  (1.803 m)   Wt 211 lb 12.8 oz (96.1 kg)   SpO2 97%   BMI 29.54 kg/m   General: General appearance: Awake and alert. No distress. Cooperative with exam.  Skin: No obvious rash or jaundice. HEENT: Atraumatic. Anicteric. Lungs: Non-labored breathing on room air  Extremities: No edema. Arthritic changes in bilateral hands. Psych: Affect  appropriate.  Neurological: Mental Status: Alert. Speech fluent. No pseudobulbar affect Cranial Nerves: CNII: No RAPD. Visual fields grossly intact. CNIII, IV, VI: PERRL. No nystagmus. EOMI. CN V: Facial sensation intact bilaterally to fine touch. CN VII: Facial muscles symmetric and strong. No ptosis at rest. CN VIII: Hearing grossly intact bilaterally. CN IX: No hypophonia. CN X: Palate elevates symmetrically. CN XI: Full strength shoulder shrug bilaterally. CN XII: Tongue protrusion full and midline. No atrophy or fasciculations. No significant dysarthria Motor: Tone is normal. No fasciculations in any extremities (patient examined in gown) No significant atrophy.  Individual muscle group testing (MRC grade out of 5):  Movement     Neck flexion 5    Neck extension 5     Right Left   Shoulder abduction 5 5   Shoulder adduction 5 5   Shoulder ext rotation 5 5   Shoulder int rotation 5 5   Elbow flexion 5 5   Elbow extension 5 5   Wrist extension 5 5   Wrist flexion 5 5   Finger abduction - FDI 5 5   Finger abduction - ADM 5 5   Finger extension 5 5   Finger distal flexion - 2/3 5 5    Finger distal flexion - 4/5 5 5    Thumb flexion - FPL 5 5   Thumb abduction - APB 5 5    Hip flexion 5 5   Hip extension 5 5   Hip adduction 5 5   Hip abduction 5 5   Knee extension 5 5   Knee flexion 5 5   Dorsiflexion 4 0   Plantarflexion 5 5   Inversion 4+ 4+   Eversion 4 3   Great toe extension 4 0   Great toe flexion 4 0     Reflexes:  Right Left   Bicep 1+ 1+   Tricep 1+ 1+   BrRad 1+ 1+   Knee 2+ 2+   Ankle 0 0    Pathological Reflexes: Babinski: flexor response bilaterally Hoffman: absent bilaterally Troemner: absent bilaterally Jaw jerk: absent Facial: absent bilaterally Midline tap: absent Sensation: Pinprick: Intact except 75% of normal in  left foot Coordination: Intact finger-to- nose-finger bilaterally. Romberg with mild sway. Gait: Narrow based,  steppage gait. Unsteady.  Lab and Test Review: Internal labs: SPEP (10/05/22): No M spike Kappa/lambda (10/05/22): 1.76 (1.65 is uln) CBC (10/05/22): mild anemia (Hb 12.1) CMP (10/05/22): significant for K 3.1, glucose 134, BUN 32, Cr 1.41  Imaging: CT head wo contrast (12/24/17): IMPRESSION: 1. No acute intracranial abnormality. 2. Mild age-appropriate generalized atrophy and mild to moderate chronic microvascular ischemic changes of the white matter.  ASSESSMENT: Robert [name] is a 89 y.o. male who presents for evaluation of foot drop. He has a relevant medical history of HTN, HLD, B12 deficiency, OA, multiple myeloma s/p BM transplant, iron deficiency anemia, glaucoma, CKD 3. His neurological examination is pertinent for bilateral distal leg weakness (DF, eversion, inversion, toe extension, toe flexion) and diminished sensation in left foot. Given that all weak muscles are innervated by the L5 nerve root, a lumbar radiculopathy affecting left more than right is possible. It is odd that patient does not have back pain or significant numbness or tingling though. With painless foot drop, motor neuron disease is possible, but patient's exam does not show any fasciculations or upper motor neuron signs to suggest this. I will start with MRI lumbar spine to evaluate and send patient to physical therapy.  Of note, patient's blood pressure was very elevated today (200/99). He notes that this sometimes happens at the doctors office. He denies any symptoms. He will check at home at call PCP if still elevated. He will call EMS with any neurologic symptoms which were discussed and given in AVS.  PLAN: -Blood work: B12 -MRI lumbar spine wo contrast -Physical therapy for leg weakness -Fall precautions discussed and given to patient -Patient to check BP at home and will call PCP if still elevated  -Return to clinic in 3 months  The impression above as well as the plan as outlined below were extensively  discussed with the patient who voiced understanding. All questions were answered to their satisfaction.  When available, results of the above investigations and possible further recommendations will be communicated to the patient via telephone/MyChart. Patient to call office if not contacted after expected testing turnaround time.   Total time spent reviewing records, interview, history/exam, documentation, and coordination of care on day of encounter:  50 min   Thank you for allowing me to participate in patient's care.  If I can answer any additional questions, I would be pleased to do so.  Jacquelyne Balint, MD   CC: Garlan Fillers, MD [address] [place] [address]  CC: Referring provider: Pilar [name], DPM [address] [place],  [place] [address]

## 2022-11-03 ENCOUNTER — Encounter: Payer: Self-pay | Admitting: Hematology & Oncology

## 2022-11-03 ENCOUNTER — Inpatient Hospital Stay: Payer: Medicare Other

## 2022-11-03 ENCOUNTER — Inpatient Hospital Stay: Payer: Medicare Other | Attending: Hematology & Oncology

## 2022-11-03 ENCOUNTER — Other Ambulatory Visit: Payer: Self-pay

## 2022-11-03 ENCOUNTER — Inpatient Hospital Stay (HOSPITAL_BASED_OUTPATIENT_CLINIC_OR_DEPARTMENT_OTHER): Payer: Medicare Other | Admitting: Hematology & Oncology

## 2022-11-03 VITALS — BP 126/76 | HR 63 | Temp 98.0°F | Resp 16 | Ht 71.0 in | Wt 211.0 lb

## 2022-11-03 VITALS — BP 159/77 | HR 56

## 2022-11-03 DIAGNOSIS — D630 Anemia in neoplastic disease: Secondary | ICD-10-CM | POA: Insufficient documentation

## 2022-11-03 DIAGNOSIS — C9000 Multiple myeloma not having achieved remission: Secondary | ICD-10-CM

## 2022-11-03 DIAGNOSIS — D631 Anemia in chronic kidney disease: Secondary | ICD-10-CM

## 2022-11-03 DIAGNOSIS — Z5112 Encounter for antineoplastic immunotherapy: Secondary | ICD-10-CM | POA: Diagnosis not present

## 2022-11-03 DIAGNOSIS — C9002 Multiple myeloma in relapse: Secondary | ICD-10-CM | POA: Diagnosis present

## 2022-11-03 LAB — CBC WITH DIFFERENTIAL (CANCER CENTER ONLY)
Abs Immature Granulocytes: 0.01 10*3/uL (ref 0.00–0.07)
Basophils Absolute: 0 10*3/uL (ref 0.0–0.1)
Basophils Relative: 1 %
Eosinophils Absolute: 0.1 10*3/uL (ref 0.0–0.5)
Eosinophils Relative: 2 %
HCT: 33.8 % — ABNORMAL LOW (ref 39.0–52.0)
Hemoglobin: 11.3 g/dL — ABNORMAL LOW (ref 13.0–17.0)
Immature Granulocytes: 0 %
Lymphocytes Relative: 40 %
Lymphs Abs: 2.3 10*3/uL (ref 0.7–4.0)
MCH: 30.7 pg (ref 26.0–34.0)
MCHC: 33.4 g/dL (ref 30.0–36.0)
MCV: 91.8 fL (ref 80.0–100.0)
Monocytes Absolute: 0.5 10*3/uL (ref 0.1–1.0)
Monocytes Relative: 10 %
Neutro Abs: 2.7 10*3/uL (ref 1.7–7.7)
Neutrophils Relative %: 47 %
Platelet Count: 178 10*3/uL (ref 150–400)
RBC: 3.68 MIL/uL — ABNORMAL LOW (ref 4.22–5.81)
RDW: 13.2 % (ref 11.5–15.5)
WBC Count: 5.7 10*3/uL (ref 4.0–10.5)
nRBC: 0 % (ref 0.0–0.2)

## 2022-11-03 LAB — CMP (CANCER CENTER ONLY)
ALT: 14 U/L (ref 0–44)
AST: 16 U/L (ref 15–41)
Albumin: 4.3 g/dL (ref 3.5–5.0)
Alkaline Phosphatase: 50 U/L (ref 38–126)
Anion gap: 9 (ref 5–15)
BUN: 25 mg/dL — ABNORMAL HIGH (ref 8–23)
CO2: 28 mmol/L (ref 22–32)
Calcium: 10.1 mg/dL (ref 8.9–10.3)
Chloride: 101 mmol/L (ref 98–111)
Creatinine: 1.39 mg/dL — ABNORMAL HIGH (ref 0.61–1.24)
GFR, Estimated: 49 mL/min — ABNORMAL LOW (ref >60)
Glucose, Bld: 124 mg/dL — ABNORMAL HIGH (ref 70–99)
Potassium: 3.5 mmol/L (ref 3.5–5.1)
Sodium: 138 mmol/L (ref 135–145)
Total Bilirubin: 0.9 mg/dL (ref 0.3–1.2)
Total Protein: 6.3 g/dL — ABNORMAL LOW (ref 6.5–8.1)

## 2022-11-03 LAB — LACTATE DEHYDROGENASE: LDH: 219 U/L — ABNORMAL HIGH (ref 98–192)

## 2022-11-03 MED ORDER — DIPHENHYDRAMINE HCL 25 MG PO CAPS
50.0000 mg | ORAL_CAPSULE | Freq: Once | ORAL | Status: AC
  Administered 2022-11-03: 50 mg via ORAL
  Filled 2022-11-03: qty 2

## 2022-11-03 MED ORDER — HEPARIN SOD (PORK) LOCK FLUSH 100 UNIT/ML IV SOLN
500.0000 [IU] | Freq: Once | INTRAVENOUS | Status: AC
  Administered 2022-11-03: 500 [IU] via INTRAVENOUS

## 2022-11-03 MED ORDER — DARATUMUMAB-HYALURONIDASE-FIHJ 1800-30000 MG-UT/15ML ~~LOC~~ SOLN
1800.0000 mg | Freq: Once | SUBCUTANEOUS | Status: AC
  Administered 2022-11-03: 1800 mg via SUBCUTANEOUS
  Filled 2022-11-03: qty 15

## 2022-11-03 MED ORDER — SODIUM CHLORIDE 0.9% FLUSH
10.0000 mL | INTRAVENOUS | Status: DC | PRN
  Administered 2022-11-03: 10 mL via INTRAVENOUS

## 2022-11-03 MED ORDER — DEXAMETHASONE 4 MG PO TABS
20.0000 mg | ORAL_TABLET | Freq: Once | ORAL | Status: AC
  Administered 2022-11-03: 20 mg via ORAL
  Filled 2022-11-03: qty 5

## 2022-11-03 MED ORDER — ACETAMINOPHEN 325 MG PO TABS
650.0000 mg | ORAL_TABLET | Freq: Once | ORAL | Status: AC
  Administered 2022-11-03: 650 mg via ORAL
  Filled 2022-11-03: qty 2

## 2022-11-03 NOTE — Patient Instructions (Signed)

## 2022-11-03 NOTE — Progress Notes (Signed)
Hematology and Oncology Follow Up Visit  Robert [name] [id number] 06/22/1933 89 y.o. 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  The new problem that he has is that he now has foot drop in the left foot.  This happened a couple weeks ago.  He just darted.  He has had no headache.  He has been no other neurological issues.  He said that he has had no knee problems.  He did have an injection to the right knee recently.  There is no change in bowel or bladder habits.  He has had no cough or shortness of breath.  He has had no problems with the myeloma.  Last time that I saw him, there was no monoclonal spike in his blood.  His IgG level was 559 mg/dL.  The Kappa light chain was 1.4 mg/dL.  He has had no fever.  There is no nausea or vomiting.  He had no problems with COVID.  He had COVID back in December.  Overall, I would say that his performance status is probably ECOG 1.    Medications:  Allergies as of 11/03/2022   No Known Allergies      Medication List        Accurate as of November 03, 2022  9:34 AM. If you have any questions, ask your nurse or doctor.          acetaminophen-codeine 300-30 MG tablet Commonly known as: TYLENOL #3 Take 1 tablet by mouth every 8 (eight) hours as needed for moderate pain.   aspirin 81 MG tablet Take 81 mg by mouth daily.   benzonatate 100 MG capsule Commonly known as: TESSALON Take 1 capsule (100 mg total) by  mouth 3 (three) times daily as needed for cough.   bimatoprost 0.01 % Soln Commonly known as: LUMIGAN Place 1 drop into both eyes at bedtime.   CALTRATE 600+D PLUS PO Take 1 tablet by mouth daily.   carvedilol 25 MG tablet Commonly known as: COREG Take 25 mg by mouth 2 (two) times daily.   ciclopirox 0.77 % cream Commonly known as: LOPROX Apply 1 application topically 2 (two) times daily. As needed   cloNIDine 0.1 MG tablet Commonly known as: CATAPRES Take 0.1 mg by mouth 2 (two) times daily as needed (Elevated BP).   cyanocobalamin 2000 MCG tablet Take 2,000 mcg by mouth at bedtime.   Darzalex 400 MG/20ML Generic drug: daratumumab Inject 20 mLs into the vein every 30 (thirty) days.   dexamethasone 4 MG tablet Commonly known as: DECADRON Take 5 tablets prior to immunotherapy treatment What changed:  how much to take how to take this when to take this   diclofenac Sodium 1 % Gel Commonly known as: VOLTAREN Apply 2 g topically 4 (four) times daily. As needed   famciclovir 250 MG tablet Commonly known as: FAMVIR TAKE 1 TABLET DAILY  Flonase Allergy Relief 50 MCG/ACT nasal spray Generic drug: fluticasone Place 1 spray into both nostrils daily.   furosemide 20 MG tablet Commonly known as: LASIX Take 2 tablets (40 mg total) by mouth daily.   losartan 100 MG tablet Commonly known as: COZAAR Take 100 mg by mouth daily.   meloxicam 15 MG tablet Commonly known as: MOBIC Take 1 tablet by mouth daily as needed for pain.   metolazone 5 MG tablet Commonly known as: ZAROXOLYN TAKE 1 TABLET 1 HOUR PRIOR TO TAKING LASIX DAILY What changed: See the new instructions.   montelukast 10 MG tablet Commonly known as: SINGULAIR TAKE 1 TABLET AT BEDTIME   multivitamin with minerals Tabs tablet Take 1 tablet by mouth daily. Unknown strenght   omeprazole 20 MG capsule Commonly known as: PRILOSEC Take 1 capsule by mouth daily before breakfast.   potassium chloride SA  20 MEQ tablet Commonly known as: KLOR-CON M Take 1 tablet (20 mEq total) by mouth daily.   rosuvastatin 20 MG tablet Commonly known as: CRESTOR Take 20 mg by mouth at bedtime.   SYSTANE OP Place 1 drop into both eyes daily as needed (dry eyes).   Vitamin D3 25 MCG (1000 UT) Caps Take 1,000 Units by mouth daily.   Zoledronic Acid 4 MG/100ML IVPB Commonly known as: ZOMETA Inject 4 mg into the vein every 30 (thirty) days. 07/03/2022 Every 4 months.        Allergies: No Known Allergies  Past Medical History, Surgical history, Social history, and Family History were reviewed and updated.  Review of Systems: Review of Systems  Constitutional: Negative.   HENT: Negative.    Eyes: Negative.   Respiratory: Negative.    Cardiovascular: Negative.   Gastrointestinal: Negative.   Genitourinary: Negative.   Musculoskeletal:  Positive for joint pain.  Skin: Negative.   Neurological: Negative.   Endo/Heme/Allergies: Negative.   Psychiatric/Behavioral: Negative.       Physical Exam:  height is 5\' 11"  (1.803 m) and weight is 211 lb (95.7 kg). His oral temperature is 98 F (36.7 C). His blood pressure is 126/76 and his pulse is 63. His respiration is 16 and oxygen saturation is 100%.   Wt Readings from Last 3 Encounters:  11/03/22 211 lb (95.7 kg)  10/20/22 212 lb (96.2 kg)  10/05/22 209 lb (94.8 kg)   Physical Exam Vitals reviewed.  HENT:     Head: Normocephalic and atraumatic.  Eyes:     Pupils: Pupils are equal, round, and reactive to light.  Cardiovascular:     Rate and Rhythm: Normal rate and regular rhythm.     Heart sounds: Normal heart sounds.  Pulmonary:     Effort: Pulmonary effort is normal.     Breath sounds: Normal breath sounds.  Abdominal:     General: Bowel sounds are normal.     Palpations: Abdomen is soft.  Musculoskeletal:        General: No tenderness or deformity. Normal range of motion.     Cervical back: Normal range of motion.  Lymphadenopathy:      Cervical: No cervical adenopathy.  Skin:    General: Skin is warm and dry.     Findings: No erythema or rash.  Neurological:     Mental Status: He is alert and oriented to person, place, and time.     Comments: On his neurological exam, he does have decreased function in the left foot with dorsiflexion.  Right foot is unremarkable.  He has good  pulses.  He has no tenderness to palpation over the left lower leg.  Psychiatric:        Behavior: Behavior normal.        Thought Content: Thought content normal.        Judgment: Judgment normal.      Lab Results  Component Value Date   WBC 5.7 11/03/2022   HGB 11.3 (L) 11/03/2022   HCT 33.8 (L) 11/03/2022   MCV 91.8 11/03/2022   PLT 178 11/03/2022   Lab Results  Component Value Date   FERRITIN 555 (H) 07/23/2021   IRON 84 07/23/2021   TIBC 325 07/23/2021   UIBC 241 07/23/2021   IRONPCTSAT 26 07/23/2021   Lab Results  Component Value Date   RETICCTPCT 2.2 04/05/2019   RBC 3.68 (L) 11/03/2022   Lab Results  Component Value Date   KPAFRELGTCHN 14.1 10/05/2022   LAMBDASER 8.0 10/05/2022   KAPLAMBRATIO 1.76 (H) 10/05/2022   Lab Results  Component Value Date   IGGSERUM 559 (L) 10/05/2022   IGA 24 (L) 10/05/2022   IGMSERUM 54 10/05/2022   Lab Results  Component Value Date   TOTALPROTELP 6.0 10/05/2022   ALBUMINELP 3.6 10/05/2022   A1GS 0.2 10/05/2022   A2GS 0.8 10/05/2022   BETS 0.9 10/05/2022   BETA2SER 0.3 01/03/2015   GAMS 0.5 10/05/2022   MSPIKE Not Observed 10/05/2022   SPEI Comment 10/05/2022     Chemistry      Component Value Date/Time   NA 138 11/03/2022 0835   NA 137 04/08/2017 1141   NA 138 10/29/2015 1029   K 3.5 11/03/2022 0835   K 3.4 04/08/2017 1141   K 3.9 10/29/2015 1029   CL 101 11/03/2022 0835   CL 102 04/08/2017 1141   CO2 28 11/03/2022 0835   CO2 30 04/08/2017 1141   CO2 27 10/29/2015 1029   BUN 25 (H) 11/03/2022 0835   BUN 16 04/08/2017 1141   BUN 13.8 10/29/2015 1029    CREATININE 1.39 (H) 11/03/2022 0835   CREATININE 1.3 (H) 04/08/2017 1141   CREATININE 1.1 10/29/2015 1029      Component Value Date/Time   CALCIUM 10.1 11/03/2022 0835   CALCIUM 9.3 04/08/2017 1141   CALCIUM 9.3 10/29/2015 1029   ALKPHOS 50 11/03/2022 0835   ALKPHOS 66 04/08/2017 1141   ALKPHOS 52 10/29/2015 1029   AST 16 11/03/2022 0835   AST 20 10/29/2015 1029   ALT 14 11/03/2022 0835   ALT 21 04/08/2017 1141   ALT 14 10/29/2015 1029   BILITOT 0.9 11/03/2022 0835   BILITOT 0.75 10/29/2015 1029       Impression and Plan: Mr. Finkler is a very pleasant 89 yo African American gentleman with relapsed IgG kappa myeloma. He has history of stem cell transplant in 2006 and since relapsed.    I really am not sure as to why has this foot drop in the left foot.  He sees Neurology in a couple days.  We will have to see what they have to do.  I would suspect he is  Need an MRI of the lumbar spine.  I do not think this is some that is from the central nervous system.  Has no other neurological issues.  I know he had horrible time with blood pressure but this is much better controlled now.  I think that he probably will need to have nerve conduction studies or EMG.  I am sure that Dr. Loleta Chance will be able to help  [place] out.  Regardless, we will continue treatment.  I cannot imagine that this is from his therapy.  He just is on daratumumab.   We will plan to get him back in 1 more month.    Josph Macho, MD 6/11/20249:34 AM

## 2022-11-03 NOTE — Patient Instructions (Signed)
[facility]  Discharge Instructions: Thank you for choosing [facility] to provide your oncology and hematology care.   If you have a lab appointment with the Cancer Center, please go directly to the Cancer Center and check in at the registration area.  Wear comfortable clothing and clothing appropriate for easy access to any Portacath or PICC line.   We strive to give you quality time with your provider. You may need to reschedule your appointment if you arrive late (15 or more minutes).  Arriving late affects you and other patients whose appointments are after yours.  Also, if you miss three or more appointments without notifying the office, you may be dismissed from the clinic at the provider's discretion.      For prescription refill requests, have your pharmacy contact our office and allow 72 hours for refills to be completed.    Today you received the following chemotherapy and/or immunotherapy agents Darzalex      To help prevent nausea and vomiting after your treatment, we encourage you to take your nausea medication as directed.  BELOW ARE SYMPTOMS THAT SHOULD BE REPORTED IMMEDIATELY: *FEVER GREATER THAN 100.4 F (38 C) OR HIGHER *CHILLS OR SWEATING *NAUSEA AND VOMITING THAT IS NOT CONTROLLED WITH YOUR NAUSEA MEDICATION *UNUSUAL SHORTNESS OF BREATH *UNUSUAL BRUISING OR BLEEDING *URINARY PROBLEMS (pain or burning when urinating, or frequent urination) *BOWEL PROBLEMS (unusual diarrhea, constipation, pain near the anus) TENDERNESS IN MOUTH AND THROAT WITH OR WITHOUT PRESENCE OF ULCERS (sore throat, sores in mouth, or a toothache) UNUSUAL RASH, SWELLING OR PAIN  UNUSUAL VAGINAL DISCHARGE OR ITCHING   Items with * indicate a potential emergency and should be followed up as soon as possible or go to the Emergency Department if any problems should occur.  Please show the CHEMOTHERAPY ALERT CARD or IMMUNOTHERAPY ALERT CARD at check-in  to the Emergency Department and triage nurse. Should you have questions after your visit or need to cancel or reschedule your appointment, please contact [facility]  [phone] and follow the prompts.  Office hours are 8:00 a.m. to 4:30 p.m. Monday - Friday. Please note that voicemails left after 4:00 p.m. may not be returned until the following business day.  We are closed weekends and major holidays. You have access to a nurse at all times for urgent questions. Please call the main number to the clinic [phone] and follow the prompts.  For any non-urgent questions, you may also contact your provider using MyChart. We now offer e-Visits for anyone 18 and older to request care online for non-urgent symptoms. For details visit [email or web address].   Also download the MyChart app! Go to the app store, search "MyChart", open the app, select [facility], and log in with your MyChart username and password.   

## 2022-11-04 LAB — IGG, IGA, IGM
IgA: 27 mg/dL — ABNORMAL LOW (ref 61–437)
IgG (Immunoglobin G), Serum: 554 mg/dL — ABNORMAL LOW (ref 603–1613)
IgM (Immunoglobulin M), Srm: 60 mg/dL (ref 15–143)

## 2022-11-05 ENCOUNTER — Encounter: Payer: Self-pay | Admitting: Neurology

## 2022-11-05 ENCOUNTER — Ambulatory Visit (INDEPENDENT_AMBULATORY_CARE_PROVIDER_SITE_OTHER): Payer: Medicare Other | Admitting: Neurology

## 2022-11-05 ENCOUNTER — Other Ambulatory Visit (INDEPENDENT_AMBULATORY_CARE_PROVIDER_SITE_OTHER): Payer: Medicare Other

## 2022-11-05 VITALS — BP 200/99 | HR 67 | Ht 71.0 in | Wt 211.8 lb

## 2022-11-05 DIAGNOSIS — M21371 Foot drop, right foot: Secondary | ICD-10-CM | POA: Diagnosis not present

## 2022-11-05 DIAGNOSIS — R269 Unspecified abnormalities of gait and mobility: Secondary | ICD-10-CM | POA: Diagnosis not present

## 2022-11-05 DIAGNOSIS — R29898 Other symptoms and signs involving the musculoskeletal system: Secondary | ICD-10-CM

## 2022-11-05 DIAGNOSIS — M21372 Foot drop, left foot: Secondary | ICD-10-CM

## 2022-11-05 LAB — KAPPA/LAMBDA LIGHT CHAINS
Kappa free light chain: 13.7 mg/L (ref 3.3–19.4)
Kappa, lambda light chain ratio: 2.54 — ABNORMAL HIGH (ref 0.26–1.65)
Lambda free light chains: 5.4 mg/L — ABNORMAL LOW (ref 5.7–26.3)

## 2022-11-05 NOTE — Patient Instructions (Addendum)
I saw you today for left foot drop. Upon examination, I think your right foot is also weak, just not as weak as the left. The most likely explanation is that this is coming from a pinched nerve in your back.  I would like to investigate further with: -Blood work today Your provider has requested that you have labwork completed today. The lab is located on the Second floor at [address], within the [facility] office. When you get off the elevator, turn right and go in the [facility] [address]; the first brown door on the left.  Tell the ladies behind the desk that you are there for lab work. If you are not called within 15 minutes please check with the front desk.   Once you complete your labs you are free to go. You will receive a call or message via MyChart with your lab results.    -MRI of your low back (lumbar spine) A referral to [facility] has been placed for your MRI someone will contact you directly to schedule your appt. They are located at [address] [facility]. Please contact them directly by calling [phone] with any questions regarding your referral.   I will be in touch when I have your results.  I would like you to do physical therapy.  I would like to see you back in about 3 months to check on your progress.  Your blood pressure was very high today. You denied any symptoms today. We discussed and you promised you would call your primary care doctor and will go to the emergency room is you have a bad headache or any neurologic symptoms like weakness, numbness, vision changes, or confusion.  The physicians and staff at [facility] are committed to providing excellent care. You may receive a survey requesting feedback about your experience at our office. We strive to receive "very good" responses to the survey questions. If you feel that your experience would prevent you from giving the office a "very good " response, please contact our office to  try to remedy the situation. We may be reached at [phone]. Thank you for taking the time out of your busy day to complete the survey.  Jacquelyne Balint, MD [facility]  Preventing Falls at [facility] are common, often dreaded events in the lives of older people. Aside from the obvious injuries and even death that may result, fall can cause wide-ranging consequences including loss of independence, mental decline, decreased activity and mobility. Younger people are also at risk of falling, especially those with chronic illnesses and fatigue.  Ways to reduce risk for falling Examine diet and medications. Warm foods and alcohol dilate blood vessels, which can lead to dizziness when standing. Sleep aids, antidepressants and pain medications can also increase the likelihood of a fall.  Get a vision exam. Poor vision, cataracts and glaucoma increase the chances of falling.  Check foot gear. Shoes should fit snugly and have a sturdy, nonskid sole and a broad, low heel  Participate in a physician-approved exercise program to build and maintain muscle strength and improve balance and coordination. Programs that use ankle weights or stretch bands are excellent for muscle-strengthening. Water aerobics programs and low-impact Tai Chi programs have also been shown to improve balance and coordination.  Increase vitamin D intake. Vitamin D improves muscle strength and increases the amount of calcium the body is able to absorb and deposit in bones.  How to prevent falls from common hazards  Floors - Remove all loose wires, cords, and throw rugs. Minimize clutter. Make sure rugs are anchored and smooth. Keep furniture in its usual place.  Chairs -- Use chairs with straight backs, armrests and firm seats. Add firm cushions to existing pieces to add height.  Bathroom - Install grab bars and non-skid tape in the tub or shower. Use a bathtub transfer bench or a shower chair with a back support Use an  elevated toilet seat and/or safety rails to assist standing from a low surface. Do not use towel racks or bathroom tissue holders to help you stand.  Lighting - Make sure halls, stairways, and entrances are well-lit. Install a night light in your bathroom or hallway. Make sure there is a light switch at the top and bottom of the staircase. Turn lights on if you get up in the middle of the night. Make sure lamps or light switches are within reach of the bed if you have to get up during the night.  Kitchen - Install non-skid rubber mats near the sink and stove. Clean spills immediately. Store frequently used utensils, pots, pans between waist and eye level. This helps prevent reaching and bending. Sit when getting things out of lower cupboards.  Living room/ Bedrooms - Place furniture with wide spaces in between, giving enough room to move around. Establish a route through the living room that gives you something to hold onto as you walk.  Stairs - Make sure treads, rails, and rugs are secure. Install a rail on both sides of the stairs. If stairs are a threat, it might be helpful to arrange most of your activities on the lower level to reduce the number of times you must climb the stairs.  Entrances and doorways - Install metal handles on the walls adjacent to the doorknobs of all doors to make it more secure as you travel through the doorway.  Tips for maintaining balance Keep at least one hand free at all times. Try using a backpack or fanny pack to hold things rather than carrying them in your hands. Never carry objects in both hands when walking as this interferes with keeping your balance.  Attempt to swing both arms from front to back while walking. This might require a conscious effort if Parkinson's disease has diminished your movement. It will, however, help you to maintain balance and posture, and reduce fatigue.  Consciously lift your feet off of the ground when walking. Shuffling and  dragging of the feet is a common culprit in losing your balance.  When trying to navigate turns, use a "U" technique of facing forward and making a wide turn, rather than pivoting sharply.  Try to stand with your feet shoulder-length apart. When your feet are close together for any length of time, you increase your risk of losing your balance and falling.  Do one thing at a time. Don't try to walk and accomplish another task, such as reading or looking around. The decrease in your automatic reflexes complicates motor function, so the less distraction, the better.  Do not wear rubber or gripping soled shoes, they might "catch" on the floor and cause tripping.  Move slowly when changing positions. Use deliberate, concentrated movements and, if needed, use a grab bar or walking aid. Count 15 seconds between each movement. For example, when rising from a seated position, wait 15 seconds after standing to begin walking.  If balance is a continuous problem, you might want to consider a walking aid such as  a cane, walking stick, or walker. Once you've mastered walking with help, you might be ready to try it on your own again.

## 2022-11-06 ENCOUNTER — Other Ambulatory Visit: Payer: Self-pay

## 2022-11-06 LAB — PROTEIN ELECTROPHORESIS, SERUM
A/G Ratio: 1.4 (ref 0.7–1.7)
Albumin ELP: 3.6 g/dL (ref 2.9–4.4)
Alpha-1-Globulin: 0.2 g/dL (ref 0.0–0.4)
Alpha-2-Globulin: 0.8 g/dL (ref 0.4–1.0)
Beta Globulin: 1 g/dL (ref 0.7–1.3)
Gamma Globulin: 0.5 g/dL (ref 0.4–1.8)
Globulin, Total: 2.5 g/dL (ref 2.2–3.9)
Total Protein ELP: 6.1 g/dL (ref 6.0–8.5)

## 2022-11-06 LAB — VITAMIN B12: Vitamin B-12: 719 pg/mL (ref 211–911)

## 2022-11-13 ENCOUNTER — Ambulatory Visit
Admission: RE | Admit: 2022-11-13 | Discharge: 2022-11-13 | Disposition: A | Discharge status: Home / Self Care | Payer: Medicare Other | Source: Ambulatory Visit | Attending: Neurology | Admitting: Neurology

## 2022-11-13 DIAGNOSIS — R29898 Other symptoms and signs involving the musculoskeletal system: Secondary | ICD-10-CM

## 2022-11-13 DIAGNOSIS — R269 Unspecified abnormalities of gait and mobility: Secondary | ICD-10-CM

## 2022-11-13 DIAGNOSIS — M21371 Foot drop, right foot: Secondary | ICD-10-CM

## 2022-11-17 ENCOUNTER — Telehealth: Payer: Self-pay

## 2022-11-17 NOTE — Telephone Encounter (Signed)
Approved for Monvisc-left knee Buy and Bill Covered at 100% No prior auth required

## 2022-11-17 NOTE — Telephone Encounter (Signed)
Xu pt- ok to schedule at next available   LVM for pt to cb to schedule

## 2022-11-18 ENCOUNTER — Encounter: Payer: Self-pay | Admitting: Physical Therapy

## 2022-11-18 ENCOUNTER — Other Ambulatory Visit: Payer: Self-pay

## 2022-11-18 ENCOUNTER — Ambulatory Visit: Payer: Medicare Other | Attending: Neurology | Admitting: Physical Therapy

## 2022-11-18 DIAGNOSIS — R269 Unspecified abnormalities of gait and mobility: Secondary | ICD-10-CM | POA: Insufficient documentation

## 2022-11-18 DIAGNOSIS — R2689 Other abnormalities of gait and mobility: Secondary | ICD-10-CM | POA: Diagnosis present

## 2022-11-18 DIAGNOSIS — R29898 Other symptoms and signs involving the musculoskeletal system: Secondary | ICD-10-CM | POA: Diagnosis not present

## 2022-11-18 DIAGNOSIS — M6281 Muscle weakness (generalized): Secondary | ICD-10-CM | POA: Insufficient documentation

## 2022-11-18 DIAGNOSIS — M21371 Foot drop, right foot: Secondary | ICD-10-CM | POA: Diagnosis not present

## 2022-11-18 DIAGNOSIS — M21372 Foot drop, left foot: Secondary | ICD-10-CM | POA: Diagnosis present

## 2022-11-18 DIAGNOSIS — R2681 Unsteadiness on feet: Secondary | ICD-10-CM | POA: Insufficient documentation

## 2022-11-18 NOTE — Therapy (Signed)
OUTPATIENT PHYSICAL THERAPY NEURO EVALUATION   Patient Name: KASSEM CACY MRN: [id number] DOB:04-Dec-1933, 89 y.o., male Today's Date: 11/18/2022   PCP: Garlan Fillers, MD REFERRING PROVIDER: Antony Madura, MD  END OF SESSION:  PT End of Session - 11/18/22 1553     Visit Number 1    Number of Visits 5   4+eval   Date for PT Re-Evaluation 01/01/23   Pushed out to accommodate patient being OOT 7/1-7/5.   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   Glaucoma of both eyes 10/10/2019   Goals of care, counseling/discussion 05/12/2018   Groin lump 12/28/2016   Hyperlipidemia    Hypertension    IgG multiple myeloma (HCC) 07/23/2004   Iron deficiency anemia 10/07/2018   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   Lytic bone lesions on xray 03/17/2016   Multiple myeloma (HCC)    2006   Multiple myeloma in remission (HCC) 03/19/2010   Myeloma (HCC) 07/02/2011   S/P autologous bone marrow transplantation (HCC) 04/29/2005   Swelling of both lower extremities 10/18/2018   Syncope 12/24/2017   White coat syndrome with diagnosis of hypertension 11/28/2015   Past Surgical History:  Procedure Laterality Date   CATARACT EXTRACTION BILATERAL W/ ANTERIOR VITRECTOMY     IR IMAGING GUIDED PORT INSERTION  05/30/2018   LIMBAL STEM CELL TRANSPLANT     Patient Active Problem List   Diagnosis Date Noted   Multiple myeloma (HCC)     Hypertension    Hyperlipidemia    Arthritis    Glaucoma of both eyes 10/10/2019   Swelling of both lower extremities 10/18/2018   Iron deficiency anemia 10/07/2018   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   Anemia of chronic renal failure, stage 3 (moderate) (HCC) 10/06/2018   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   Goals of care, counseling/discussion 05/12/2018   Syncope 12/24/2017   Groin lump 12/28/2016   Lytic bone lesions on xray 03/17/2016   Cerumen debris on tympanic membrane 11/28/2015   White coat syndrome with diagnosis of hypertension 11/28/2015   Dyslipidemia 04/08/2015   Myeloma (HCC) 07/02/2011   ED (erectile dysfunction) 06/02/2010   Benign essential hypertension 03/19/2010   Multiple myeloma in remission (HCC) 03/19/2010   B12 deficiency 02/17/2010   S/P autologous bone marrow transplantation (HCC) 04/29/2005   IgG multiple myeloma (HCC) 07/23/2004    ONSET DATE: April 2024 (1-2 weeks before podiatry appt)  REFERRING DIAG: M21.371,M21.372 (ICD-10-CM) - Bilateral foot-drop R29.898 (ICD-10-CM) - Weakness of both lower extremities R26.9 (ICD-10-CM) - Gait abnormality  THERAPY DIAG:  Foot drop, left  Other abnormalities of gait and mobility  Muscle weakness (generalized)  Unsteadiness on feet  Rationale for Evaluation and Treatment: Rehabilitation  SUBJECTIVE:  SUBJECTIVE STATEMENT: Pt presents wearing left AFO.  He prefers to not wear the brace, but has been wearing it as it prevents him from dragging the left leg.  He denies any falls since issue has progressed in April just prior to Podiatry appt.  Podiatrist recommended AFO and sent him to [facility].  He presents with list of questions regarding prognosis, what is foot drop, causes of foot drop.  He denies pain in his back  recently or prior.  He denies traumatic falls or injuries to his lumbar spine as well as crush or traction style injuries to the left LE.  He is having muscle soreness in the right calf since wearing the left AFO. Pt accompanied by: significant other-Wife, [name]  PERTINENT HISTORY: IgG multiple myeloma (infusion once a month), HTN, glaucoma of both eyes, anemia, Chronic renal failure-stage 3  PAIN:  Are you having pain? No  PRECAUTIONS: Fall and Other: right Port-a-Cath  WEIGHT BEARING RESTRICTIONS: No  FALLS: Has patient fallen in last 6 months? No  LIVING ENVIRONMENT: Lives with: lives with their spouse Lives in: House/apartment Stairs: No Has following equipment at home: Environmental consultant - 2 wheeled and Grab bars  PLOF: Independent  PATIENT GOALS: "I'd like to get it back normal without the drop part."  OBJECTIVE:   DIAGNOSTIC FINDINGS: MRI of lumbar spine completed 6/21 w/o interpretation available as of 11/18/2022.  COGNITION: Overall cognitive status: Within functional limits for tasks assessed   SENSATION: Light touch: WFL  COORDINATION: Impaired due to lack of active ROM so LE RAMS not formally assessed.  With AFO donned LE heel-to-shin WNL bilaterally.  EDEMA:  Mild edema in left foot, possible fluid volume changes w/ compression from AFO strap in left calf vs possible atrophy; left great toenail appears pinkish/purple with overgrowth-pt states he believes it is dying, but denies pain.  MUSCLE TONE: None noted in BLE  POSTURE: No Significant postural limitations  LOWER EXTREMITY ROM:     Active  Right Eval Left Eval  Hip flexion WNL  Hip extension   Hip abduction   Hip adduction   Hip internal rotation   Hip external rotation   Knee flexion   Knee extension   Ankle dorsiflexion " ~4 degrees  Ankle plantarflexion " "  Ankle inversion    Ankle eversion     (Blank rows = not tested)  LOWER EXTREMITY MMT:    MMT Right Eval Left Eval  Hip flexion 5/5 4+/5   Hip extension    Hip abduction 4+/5 4+/5  Hip adduction 4+/5 4+/5  Hip internal rotation    Hip external rotation    Knee flexion 4/5 4/5  Knee extension 5/5 5/5  Ankle dorsiflexion 3/5 1+/5  Ankle plantarflexion    Ankle inversion    Ankle eversion    (Blank rows = not tested)  BED MOBILITY:  Sit to supine Complete Independence Supine to sit Complete Independence Rolling to Right Complete Independence Rolling to Left Complete Independence  TRANSFERS: Assistive device utilized: None  Sit to stand: Complete Independence Stand to sit: Complete Independence Chair to chair: Modified independence  GAIT: Gait pattern:  bilateral ER, decreased trunk rotation, and wide BOS Distance walked: various clinic distances Assistive device utilized:  Right posterior [place] AFO Level of assistance: SBA Comments: Pt is independent in AFO management/donning/doffing.  Pt reports fatigue at end of distance, but denies pain in back when ambulating.  Good arm swing and stride, no gross sway or wandering from pathway.  Pt  remains appropriately upright during all short bouts of ambulation.  FUNCTIONAL TESTS:  5 times sit to stand: 19.06 seconds w/ LUE support 10 meter walk test: 14.88 seconds no AD SBA = 0.67 m/sec OR 2.22 ft/sec Dynamic Gait Index: To be assessed.  PATIENT SURVEYS:  None relevant to chief complaint.  TODAY'S TREATMENT:                                                                                                                              DATE: 11/18/2022  Education-see below.   PATIENT EDUCATION: Education details: Answered pt list of questions relative to recent diagnosis, AFO use, potential causes and prognosis.  Discussion of intermittent cane use per pt interest in benefits.  Deferred medication questions to prescribing MD/nurse team.  PRICE principles for muscular soreness management in RLE and safety using heat/ice (layers between source and skin).  Follow-up  with podiatry regarding toe nail discoloration and appearance.  PT POC based on deficits seen, assessments used and to be used with goals set as appropriate. Person educated: Patient and Spouse Education method: Medical illustrator Education comprehension: verbalized understanding and needs further education  HOME EXERCISE PROGRAM: To be established.  GOALS: Goals reviewed with patient? Yes  SHORT TERM GOALS = LONG TERM GOALS: Target date: 12/18/2022  Pt will be independent and compliant with strength, stretching, and balance focused HEP in order to maintain functional progress and improve mobility. Baseline:  To be established. Goal status: INITIAL  2.  Pt will decrease 5xSTS to </=13 seconds in order to demonstrate decreased risk for falls and improved functional bilateral LE strength and power. Baseline: 19.06 sec w/ LUE support Goal status: INITIAL  3.  Pt will demonstrate a gait speed of >/=2.62 feet/sec in order to decrease risk for falls. Baseline: 2.22 ft/sec w/o AD using L AFO SBA Goal status: INITIAL  4.  DGI to be assessed w/ goal set as appropriate. Baseline: To be assessed. Goal status: INITIAL  ASSESSMENT:  CLINICAL IMPRESSION: Patient is an 89 y.o. male who was seen today for physical therapy evaluation and treatment for bilateral foot drop.  Pt has a significant PMH of IgG multiple myeloma (infusion once a month), HTN, glaucoma of both eyes, anemia, and chronic renal failure-stage 3.  Identified impairments include left DF AROM limited to roughly 4 degrees, left DF weakness worse than right with generalized LE weakness, and gait deficits that include bilateral LE ER and widened BOS.  Evaluation via the following assessment tools: 5xSTS and indicate fall risk.  DGI to be assessed at next visit to further determine deficits related to fall risk.  He would benefit from skilled PT to address impairments as noted and progress towards long term  goals.  OBJECTIVE IMPAIRMENTS: Abnormal gait, decreased balance, decreased knowledge of condition, decreased knowledge of use of DME, decreased ROM, decreased strength, impaired flexibility, and improper body mechanics.   ACTIVITY LIMITATIONS: squatting, stairs, transfers, and locomotion level  PARTICIPATION LIMITATIONS: driving, shopping, community activity, and yard work  PERSONAL FACTORS: Age, Fitness, Past/current experiences, Time since onset of injury/illness/exacerbation, and 1-2 comorbidities: IgG multiple myeloma w/ ongoing infusions and HTN  are also affecting patient's functional outcome.   REHAB POTENTIAL: Good  CLINICAL DECISION MAKING: Evolving/moderate complexity  EVALUATION COMPLEXITY: Moderate  PLAN:  PT FREQUENCY: 1x/week  PT DURATION: 4 weeks  PLANNED INTERVENTIONS: Therapeutic exercises, Therapeutic activity, Neuromuscular re-education, Balance training, Gait training, Patient/Family education, Self Care, Joint mobilization, Stair training, Vestibular training, Orthotic/Fit training, DME instructions, Manual therapy, and Re-evaluation  PLAN FOR NEXT SESSION: Assess DGI-set LTG as appropriate.  Initiate HEP for ankle strength/ROM/stretching and balance deficits.  Gait training w/ AFO-education on [facility] use (outdoors?), Did MRI results come back?  Work on normalized BOS with dynamic activity.   Sadie Haber, PT, DPT 11/18/2022, 4:31 PM

## 2022-11-24 NOTE — Progress Notes (Signed)
Tried to call patient, line busy at 2:36 11/24/2022

## 2022-11-25 ENCOUNTER — Telehealth: Payer: Self-pay | Admitting: Neurology

## 2022-11-25 NOTE — Telephone Encounter (Signed)
I advised to patient, resulted call back to Dr. Loleta Chance

## 2022-11-25 NOTE — Telephone Encounter (Signed)
Pt is calling back to get his MRI results and stated to please call the cell phone 1st and if no answer pls call the home phone and leave the message on the answering machine.

## 2022-11-25 NOTE — Telephone Encounter (Signed)
Patient is returning a call to someone, doesn't know who called

## 2022-11-26 ENCOUNTER — Other Ambulatory Visit: Payer: Self-pay | Admitting: Family

## 2022-11-26 DIAGNOSIS — M899 Disorder of bone, unspecified: Secondary | ICD-10-CM

## 2022-11-26 DIAGNOSIS — C9000 Multiple myeloma not having achieved remission: Secondary | ICD-10-CM

## 2022-11-26 DIAGNOSIS — M898X9 Other specified disorders of bone, unspecified site: Secondary | ICD-10-CM

## 2022-11-30 ENCOUNTER — Inpatient Hospital Stay: Payer: Medicare Other

## 2022-11-30 ENCOUNTER — Inpatient Hospital Stay: Payer: Medicare Other | Admitting: Medical Oncology

## 2022-11-30 ENCOUNTER — Inpatient Hospital Stay: Payer: Medicare Other | Attending: Hematology & Oncology

## 2022-11-30 ENCOUNTER — Encounter: Payer: Self-pay | Admitting: Medical Oncology

## 2022-11-30 ENCOUNTER — Other Ambulatory Visit: Payer: Self-pay

## 2022-11-30 DIAGNOSIS — C9000 Multiple myeloma not having achieved remission: Secondary | ICD-10-CM

## 2022-11-30 DIAGNOSIS — C9002 Multiple myeloma in relapse: Secondary | ICD-10-CM | POA: Insufficient documentation

## 2022-11-30 DIAGNOSIS — Z5112 Encounter for antineoplastic immunotherapy: Secondary | ICD-10-CM | POA: Diagnosis present

## 2022-11-30 DIAGNOSIS — D630 Anemia in neoplastic disease: Secondary | ICD-10-CM | POA: Diagnosis not present

## 2022-11-30 LAB — CBC WITH DIFFERENTIAL (CANCER CENTER ONLY)
Abs Immature Granulocytes: 0.01 10*3/uL (ref 0.00–0.07)
Basophils Absolute: 0 10*3/uL (ref 0.0–0.1)
Basophils Relative: 1 %
Eosinophils Absolute: 0.2 10*3/uL (ref 0.0–0.5)
Eosinophils Relative: 3 %
HCT: 31.7 % — ABNORMAL LOW (ref 39.0–52.0)
Hemoglobin: 10.5 g/dL — ABNORMAL LOW (ref 13.0–17.0)
Immature Granulocytes: 0 %
Lymphocytes Relative: 43 %
Lymphs Abs: 2.6 10*3/uL (ref 0.7–4.0)
MCH: 31 pg (ref 26.0–34.0)
MCHC: 33.1 g/dL (ref 30.0–36.0)
MCV: 93.5 fL (ref 80.0–100.0)
Monocytes Absolute: 0.6 10*3/uL (ref 0.1–1.0)
Monocytes Relative: 10 %
Neutro Abs: 2.6 10*3/uL (ref 1.7–7.7)
Neutrophils Relative %: 43 %
Platelet Count: 157 10*3/uL (ref 150–400)
RBC: 3.39 MIL/uL — ABNORMAL LOW (ref 4.22–5.81)
RDW: 13.7 % (ref 11.5–15.5)
WBC Count: 6 10*3/uL (ref 4.0–10.5)
nRBC: 0 % (ref 0.0–0.2)

## 2022-11-30 LAB — CMP (CANCER CENTER ONLY)
ALT: 11 U/L (ref 0–44)
AST: 15 U/L (ref 15–41)
Albumin: 4.1 g/dL (ref 3.5–5.0)
Alkaline Phosphatase: 41 U/L (ref 38–126)
Anion gap: 7 (ref 5–15)
BUN: 26 mg/dL — ABNORMAL HIGH (ref 8–23)
CO2: 29 mmol/L (ref 22–32)
Calcium: 10.2 mg/dL (ref 8.9–10.3)
Chloride: 103 mmol/L (ref 98–111)
Creatinine: 1.5 mg/dL — ABNORMAL HIGH (ref 0.61–1.24)
GFR, Estimated: 45 mL/min — ABNORMAL LOW (ref >60)
Glucose, Bld: 99 mg/dL (ref 70–99)
Potassium: 3.4 mmol/L — ABNORMAL LOW (ref 3.5–5.1)
Sodium: 139 mmol/L (ref 135–145)
Total Bilirubin: 0.9 mg/dL (ref 0.3–1.2)
Total Protein: 6.1 g/dL — ABNORMAL LOW (ref 6.5–8.1)

## 2022-11-30 LAB — LACTATE DEHYDROGENASE: LDH: 210 U/L — ABNORMAL HIGH (ref 98–192)

## 2022-11-30 MED ORDER — ACETAMINOPHEN 325 MG PO TABS
650.0000 mg | ORAL_TABLET | Freq: Once | ORAL | Status: AC
  Administered 2022-11-30: 650 mg via ORAL
  Filled 2022-11-30: qty 2

## 2022-11-30 MED ORDER — DIPHENHYDRAMINE HCL 25 MG PO CAPS
50.0000 mg | ORAL_CAPSULE | Freq: Once | ORAL | Status: AC
  Administered 2022-11-30: 50 mg via ORAL
  Filled 2022-11-30: qty 2

## 2022-11-30 MED ORDER — DEXAMETHASONE 4 MG PO TABS
20.0000 mg | ORAL_TABLET | Freq: Once | ORAL | Status: AC
  Administered 2022-11-30: 20 mg via ORAL
  Filled 2022-11-30: qty 5

## 2022-11-30 MED ORDER — DARATUMUMAB-HYALURONIDASE-FIHJ 1800-30000 MG-UT/15ML ~~LOC~~ SOLN
1800.0000 mg | Freq: Once | SUBCUTANEOUS | Status: AC
  Administered 2022-11-30: 1800 mg via SUBCUTANEOUS
  Filled 2022-11-30: qty 15

## 2022-11-30 NOTE — Patient Instructions (Signed)

## 2022-11-30 NOTE — Progress Notes (Signed)
Hematology and Oncology Follow Up Visit  Robert [name] [id number] 08/08/1933 89 y.o. 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   He continues to be seen by neurology/ortho/PT for his newer foot drop. Other than this he reports doing well.    There is no change in bowel or bladder habits.  He has had no cough or shortness of breath.  He has had no problems with the myeloma.  Last time that I saw him, there was no monoclonal spike in his blood.  His IgG level was 554 mg/dL.  The Kappa light chain was 2.54 mg/dL.  He has had no fever.  There is no nausea or vomiting.  He had no problems with COVID.  He had COVID back in December.  Wt Readings from Last 3 Encounters:  11/30/22 212 lb 1.3 oz (96.2 kg)  11/05/22 211 lb 12.8 oz (96.1 kg)  11/03/22 211 lb (95.7 kg)    Overall, I would say that his performance status is probably ECOG 1.    Medications:  Allergies as of 11/30/2022   No Known Allergies      Medication List        Accurate as of November 30, 2022  9:15 AM. If you have any questions, ask your nurse or doctor.          acetaminophen-codeine 300-30 MG tablet Commonly known as: TYLENOL #3 Take 1 tablet by mouth every 8 (eight) hours as needed for moderate pain.   aspirin 81 MG tablet Take 81 mg by mouth daily.   benzonatate 100 MG capsule Commonly known as: TESSALON Take 1 capsule (100 mg total) by mouth 3 (three) times daily as needed for  cough.   bimatoprost 0.01 % Soln Commonly known as: LUMIGAN Place 1 drop into both eyes at bedtime.   CALTRATE 600+D PLUS PO Take 1 tablet by mouth daily.   carvedilol 25 MG tablet Commonly known as: COREG Take 25 mg by mouth 2 (two) times daily.   ciclopirox 0.77 % cream Commonly known as: LOPROX Apply 1 application topically 2 (two) times daily. As needed   cloNIDine 0.1 MG tablet Commonly known as: CATAPRES Take 0.1 mg by mouth 2 (two) times daily as needed (Elevated BP).   cyanocobalamin 2000 MCG tablet Take 2,000 mcg by mouth at bedtime.   Darzalex 400 MG/20ML Generic drug: daratumumab Inject 20 mLs into the vein every 30 (thirty) days.   dexamethasone 4 MG tablet Commonly known as: DECADRON Take 5 tablets prior to immunotherapy treatment What changed:  how much to take how to take this when to take this   diclofenac Sodium 1 % Gel Commonly known as: VOLTAREN Apply 2 g topically 4 (four) times daily. As needed   famciclovir 250 MG tablet Commonly known as: FAMVIR TAKE 1 TABLET DAILY   Flonase Allergy Relief 50 MCG/ACT nasal spray Generic drug: fluticasone  Place 1 spray into both nostrils daily.   furosemide 20 MG tablet Commonly known as: LASIX Take 2 tablets (40 mg total) by mouth daily.   losartan 100 MG tablet Commonly known as: COZAAR Take 100 mg by mouth daily.   meloxicam 15 MG tablet Commonly known as: MOBIC Take 1 tablet by mouth daily as needed for pain.   metolazone 5 MG tablet Commonly known as: ZAROXOLYN TAKE 1 TABLET 1 HOUR PRIOR TO TAKING LASIX DAILY What changed: See the new instructions.   montelukast 10 MG tablet Commonly known as: SINGULAIR TAKE 1 TABLET AT BEDTIME   multivitamin with minerals Tabs tablet Take 1 tablet by mouth daily. Unknown strenght   omeprazole 20 MG capsule Commonly known as: PRILOSEC Take 1 capsule by mouth daily before breakfast.   potassium chloride SA 20 MEQ tablet Commonly known as: KLOR-CON  M Take 1 tablet (20 mEq total) by mouth daily.   rosuvastatin 20 MG tablet Commonly known as: CRESTOR Take 20 mg by mouth at bedtime.   SYSTANE OP Place 1 drop into both eyes daily as needed (dry eyes).   Vitamin D3 25 MCG (1000 UT) Caps Take 1,000 Units by mouth daily.   Zoledronic Acid 4 MG/100ML IVPB Commonly known as: ZOMETA Inject 4 mg into the vein every 30 (thirty) days. 07/03/2022 Every 4 months.        Allergies: No Known Allergies  Past Medical History, Surgical history, Social history, and Family History were reviewed and updated.  Review of Systems: Review of Systems  Constitutional: Negative.   HENT: Negative.    Eyes: Negative.   Respiratory: Negative.    Cardiovascular: Negative.   Gastrointestinal: Negative.   Genitourinary: Negative.   Musculoskeletal:  Positive for joint pain.  Skin: Negative.   Neurological: Negative.   Endo/Heme/Allergies: Negative.   Psychiatric/Behavioral: Negative.       Physical Exam:  height is 5\' 11"  (1.803 m) and weight is 212 lb 1.3 oz (96.2 kg). His oral temperature is 98.1 F (36.7 C). His blood pressure is 149/70 (abnormal) and his pulse is 61. His respiration is 18 and oxygen saturation is 100%.   Wt Readings from Last 3 Encounters:  11/30/22 212 lb 1.3 oz (96.2 kg)  11/05/22 211 lb 12.8 oz (96.1 kg)  11/03/22 211 lb (95.7 kg)   Physical Exam Vitals reviewed.  HENT:     Head: Normocephalic and atraumatic.  Eyes:     Pupils: Pupils are equal, round, and reactive to light.  Cardiovascular:     Rate and Rhythm: Normal rate and regular rhythm.     Heart sounds: Normal heart sounds.  Pulmonary:     Effort: Pulmonary effort is normal.     Breath sounds: Normal breath sounds.  Abdominal:     General: Bowel sounds are normal.     Palpations: Abdomen is soft.  Musculoskeletal:        General: No tenderness or deformity. Normal range of motion.     Cervical back: Normal range of motion.  Lymphadenopathy:      Cervical: No cervical adenopathy.  Skin:    General: Skin is warm and dry.     Findings: No erythema or rash.  Neurological:     Mental Status: He is alert and oriented to person, place, and time.     Comments: On his neurological exam, he does have decreased function in the left foot with dorsiflexion.  Right foot is unremarkable.  He has good pulses.  He  has no tenderness to palpation over the left lower leg.  Psychiatric:        Behavior: Behavior normal.        Thought Content: Thought content normal.        Judgment: Judgment normal.      Lab Results  Component Value Date   WBC 6.0 11/30/2022   HGB 10.5 (L) 11/30/2022   HCT 31.7 (L) 11/30/2022   MCV 93.5 11/30/2022   PLT 157 11/30/2022   Lab Results  Component Value Date   FERRITIN 555 (H) 07/23/2021   IRON 84 07/23/2021   TIBC 325 07/23/2021   UIBC 241 07/23/2021   IRONPCTSAT 26 07/23/2021   Lab Results  Component Value Date   RETICCTPCT 2.2 04/05/2019   RBC 3.39 (L) 11/30/2022   Lab Results  Component Value Date   KPAFRELGTCHN 13.7 11/03/2022   LAMBDASER 5.4 (L) 11/03/2022   KAPLAMBRATIO 2.54 (H) 11/03/2022   Lab Results  Component Value Date   IGGSERUM 554 (L) 11/03/2022   IGA 27 (L) 11/03/2022   IGMSERUM 60 11/03/2022   Lab Results  Component Value Date   TOTALPROTELP 6.1 11/03/2022   ALBUMINELP 3.6 11/03/2022   A1GS 0.2 11/03/2022   A2GS 0.8 11/03/2022   BETS 1.0 11/03/2022   BETA2SER 0.3 01/03/2015   GAMS 0.5 11/03/2022   MSPIKE Not Observed 11/03/2022   SPEI Comment 11/03/2022     Chemistry      Component Value Date/Time   NA 139 11/30/2022 0835   NA 137 04/08/2017 1141   NA 138 10/29/2015 1029   K 3.4 (L) 11/30/2022 0835   K 3.4 04/08/2017 1141   K 3.9 10/29/2015 1029   CL 103 11/30/2022 0835   CL 102 04/08/2017 1141   CO2 29 11/30/2022 0835   CO2 30 04/08/2017 1141   CO2 27 10/29/2015 1029   BUN 26 (H) 11/30/2022 0835   BUN 16 04/08/2017 1141   BUN 13.8 10/29/2015 1029    CREATININE 1.50 (H) 11/30/2022 0835   CREATININE 1.3 (H) 04/08/2017 1141   CREATININE 1.1 10/29/2015 1029      Component Value Date/Time   CALCIUM 10.2 11/30/2022 0835   CALCIUM 9.3 04/08/2017 1141   CALCIUM 9.3 10/29/2015 1029   ALKPHOS 41 11/30/2022 0835   ALKPHOS 66 04/08/2017 1141   ALKPHOS 52 10/29/2015 1029   AST 15 11/30/2022 0835   AST 20 10/29/2015 1029   ALT 11 11/30/2022 0835   ALT 21 04/08/2017 1141   ALT 14 10/29/2015 1029   BILITOT 0.9 11/30/2022 0835   BILITOT 0.75 10/29/2015 1029       Impression and Plan: Robert [name] is a very pleasant 89 yo African American gentleman with relapsed IgG kappa myeloma. He has history of stem cell transplant in 2006 and since relapsed.     Labs and vitals reviewed and acceptable for treatment today with daratumumab. We discussed his creatinine level which has increased some. Per patient he has not been drinking as much water as usual over the last month without a real reason. He will increase his water intake to help with this. He will also see his PCP in about 2 weeks and have labs rechecked.   Disposition: Daratumumab today  RTC 1 month MD, labs ( CBC w/, CMP, LDH, light chains, QIG, SPEP) +- Daratumumab.   Rushie Chestnut, PA-C 7/8/20249:15 AM

## 2022-11-30 NOTE — Progress Notes (Signed)
Ok to treat with creatinine of 1.5 per Robert [name], [place]. dph

## 2022-11-30 NOTE — Patient Instructions (Signed)
[facility]  Discharge Instructions: Thank you for choosing [facility] to provide your oncology and hematology care.   If you have a lab appointment with the Cancer Center, please go directly to the Cancer Center and check in at the registration area.  Wear comfortable clothing and clothing appropriate for easy access to any Portacath or PICC line.   We strive to give you quality time with your provider. You may need to reschedule your appointment if you arrive late (15 or more minutes).  Arriving late affects you and other patients whose appointments are after yours.  Also, if you miss three or more appointments without notifying the office, you may be dismissed from the clinic at the provider's discretion.      For prescription refill requests, have your pharmacy contact our office and allow 72 hours for refills to be completed.    Today you received the following chemotherapy and/or immunotherapy agents Darzalex      To help prevent nausea and vomiting after your treatment, we encourage you to take your nausea medication as directed.  BELOW ARE SYMPTOMS THAT SHOULD BE REPORTED IMMEDIATELY: *FEVER GREATER THAN 100.4 F (38 C) OR HIGHER *CHILLS OR SWEATING *NAUSEA AND VOMITING THAT IS NOT CONTROLLED WITH YOUR NAUSEA MEDICATION *UNUSUAL SHORTNESS OF BREATH *UNUSUAL BRUISING OR BLEEDING *URINARY PROBLEMS (pain or burning when urinating, or frequent urination) *BOWEL PROBLEMS (unusual diarrhea, constipation, pain near the anus) TENDERNESS IN MOUTH AND THROAT WITH OR WITHOUT PRESENCE OF ULCERS (sore throat, sores in mouth, or a toothache) UNUSUAL RASH, SWELLING OR PAIN  UNUSUAL VAGINAL DISCHARGE OR ITCHING   Items with * indicate a potential emergency and should be followed up as soon as possible or go to the Emergency Department if any problems should occur.  Please show the CHEMOTHERAPY ALERT CARD or IMMUNOTHERAPY ALERT CARD at check-in  to the Emergency Department and triage nurse. Should you have questions after your visit or need to cancel or reschedule your appointment, please contact [facility]  [phone] and follow the prompts.  Office hours are 8:00 a.m. to 4:30 p.m. Monday - Friday. Please note that voicemails left after 4:00 p.m. may not be returned until the following business day.  We are closed weekends and major holidays. You have access to a nurse at all times for urgent questions. Please call the main number to the clinic [phone] and follow the prompts.  For any non-urgent questions, you may also contact your provider using MyChart. We now offer e-Visits for anyone 18 and older to request care online for non-urgent symptoms. For details visit [email or web address].   Also download the MyChart app! Go to the app store, search "MyChart", open the app, select [facility], and log in with your MyChart username and password.   

## 2022-12-01 ENCOUNTER — Telehealth: Payer: Self-pay | Admitting: Neurology

## 2022-12-01 ENCOUNTER — Ambulatory Visit: Payer: Medicare Other | Attending: Neurology | Admitting: Physical Therapy

## 2022-12-01 ENCOUNTER — Encounter: Payer: Self-pay | Admitting: Physical Therapy

## 2022-12-01 VITALS — BP 154/74 | HR 68

## 2022-12-01 DIAGNOSIS — R2681 Unsteadiness on feet: Secondary | ICD-10-CM | POA: Insufficient documentation

## 2022-12-01 DIAGNOSIS — M6281 Muscle weakness (generalized): Secondary | ICD-10-CM | POA: Insufficient documentation

## 2022-12-01 DIAGNOSIS — R2689 Other abnormalities of gait and mobility: Secondary | ICD-10-CM | POA: Diagnosis present

## 2022-12-01 DIAGNOSIS — M21372 Foot drop, left foot: Secondary | ICD-10-CM | POA: Insufficient documentation

## 2022-12-01 LAB — IGG, IGA, IGM
IgA: 22 mg/dL — ABNORMAL LOW (ref 61–437)
IgG (Immunoglobin G), Serum: 501 mg/dL — ABNORMAL LOW (ref 603–1613)
IgM (Immunoglobulin M), Srm: 51 mg/dL (ref 15–143)

## 2022-12-01 LAB — KAPPA/LAMBDA LIGHT CHAINS
Kappa free light chain: 13.2 mg/L (ref 3.3–19.4)
Kappa, lambda light chain ratio: 1.59 (ref 0.26–1.65)
Lambda free light chains: 8.3 mg/L (ref 5.7–26.3)

## 2022-12-01 NOTE — Telephone Encounter (Signed)
Pt left a VM wanting to speak with Dr Loleta Chance about surgery that was recommended for him

## 2022-12-01 NOTE — Therapy (Signed)
OUTPATIENT PHYSICAL THERAPY NEURO TREATMENT   Patient Name: Robert [name] MRN: [id number] DOB:Feb 11, 1934, 89 y.o., male Today's Date: 12/01/2022   PCP: Garlan Fillers, MD REFERRING PROVIDER: Antony Madura, MD  END OF SESSION:  PT End of Session - 12/01/22 1454     Visit Number 2    Number of Visits 5    Date for PT Re-Evaluation 01/01/23    Authorization Type MEDICARE PART A AND B    Progress Note Due on Visit 10    PT Start Time 1452    PT Stop Time 1537    PT Time Calculation (min) 45 min    Equipment Utilized During Treatment Gait belt    Activity Tolerance Patient tolerated treatment well    Behavior During Therapy WFL for tasks assessed/performed             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   Glaucoma of both eyes 10/10/2019   Goals of care, counseling/discussion 05/12/2018   Groin lump 12/28/2016   Hyperlipidemia    Hypertension    IgG multiple myeloma (HCC) 07/23/2004   Iron deficiency anemia 10/07/2018   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   Lytic bone lesions on xray 03/17/2016   Multiple myeloma (HCC)    2006   Multiple myeloma in remission (HCC) 03/19/2010   Myeloma (HCC) 07/02/2011   S/P autologous bone marrow transplantation (HCC) 04/29/2005   Swelling of both lower extremities 10/18/2018   Syncope 12/24/2017   White coat syndrome with diagnosis of hypertension 11/28/2015   Past Surgical History:  Procedure Laterality Date   CATARACT EXTRACTION BILATERAL W/ ANTERIOR VITRECTOMY     IR IMAGING GUIDED PORT INSERTION  05/30/2018   LIMBAL STEM CELL TRANSPLANT     Patient Active Problem List   Diagnosis Date Noted   Multiple myeloma (HCC)     Hypertension    Hyperlipidemia    Arthritis    Glaucoma of both eyes 10/10/2019   Swelling of both lower extremities 10/18/2018   Iron deficiency anemia 10/07/2018   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   Anemia of chronic renal failure, stage 3 (moderate) (HCC) 10/06/2018   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   Goals of care, counseling/discussion 05/12/2018   Syncope 12/24/2017   Groin lump 12/28/2016   Lytic bone lesions on xray 03/17/2016   Cerumen debris on tympanic membrane 11/28/2015   White coat syndrome with diagnosis of hypertension 11/28/2015   Dyslipidemia 04/08/2015   Myeloma (HCC) 07/02/2011   ED (erectile dysfunction) 06/02/2010   Benign essential hypertension 03/19/2010   Multiple myeloma in remission (HCC) 03/19/2010   B12 deficiency 02/17/2010   S/P autologous bone marrow transplantation (HCC) 04/29/2005   IgG multiple myeloma (HCC) 07/23/2004    ONSET DATE: April 2024 (1-2 weeks before podiatry appt)  REFERRING DIAG: M21.371,M21.372 (ICD-10-CM) - Bilateral foot-drop R29.898 (ICD-10-CM) - Weakness of both lower extremities R26.9 (ICD-10-CM) - Gait abnormality  THERAPY DIAG:  Other abnormalities of gait and mobility  Unsteadiness on feet  Foot drop, left  Muscle weakness (generalized)  Rationale for Evaluation and Treatment: Rehabilitation  SUBJECTIVE:  SUBJECTIVE STATEMENT: Pt reports that he isn't sure that he spoke with his doctor about his MRI result. Requested number to call back. Denies falls/near falls.  Pt accompanied by: significant other-Wife, [name]  PERTINENT HISTORY: IgG multiple myeloma (infusion once a month), HTN, glaucoma of both eyes, anemia, Chronic renal failure-stage 3  PAIN:  Are you having pain? No  PRECAUTIONS: Fall and Other: right  Port-a-Cath  WEIGHT BEARING RESTRICTIONS: No  FALLS: Has patient fallen in last 6 months? No  LIVING ENVIRONMENT: Lives with: lives with their spouse Lives in: House/apartment Stairs: No Has following equipment at home: Environmental consultant - 2 wheeled and Grab bars  PLOF: Independent  PATIENT GOALS: "I'd like to get it back normal without the drop part."  OBJECTIVE:   DIAGNOSTIC FINDINGS: MRI of lumbar spine completed 6/21 w/o interpretation available as of 11/18/2022.  COGNITION: Overall cognitive status: Within functional limits for tasks assessed   SENSATION: Light touch: WFL  COORDINATION: Impaired due to lack of active ROM so LE RAMS not formally assessed.  With AFO donned LE heel-to-shin WNL bilaterally.  EDEMA:  Mild edema in left foot, possible fluid volume changes w/ compression from AFO strap in left calf vs possible atrophy; left great toenail appears pinkish/purple with overgrowth-pt states he believes it is dying, but denies pain.  MUSCLE TONE: None noted in BLE  POSTURE: No Significant postural limitations  LOWER EXTREMITY ROM:     Active  Right Eval Left Eval  Hip flexion WNL  Hip extension   Hip abduction   Hip adduction   Hip internal rotation   Hip external rotation   Knee flexion   Knee extension   Ankle dorsiflexion " ~4 degrees  Ankle plantarflexion " "  Ankle inversion    Ankle eversion     (Blank rows = not tested)  LOWER EXTREMITY MMT:    MMT Right Eval Left Eval  Hip flexion 5/5 4+/5  Hip extension    Hip abduction 4+/5 4+/5  Hip adduction 4+/5 4+/5  Hip internal rotation    Hip external rotation    Knee flexion 4/5 4/5  Knee extension 5/5 5/5  Ankle dorsiflexion 3/5 1+/5  Ankle plantarflexion    Ankle inversion    Ankle eversion    (Blank rows = not tested)  BED MOBILITY:  Sit to supine Complete Independence Supine to sit Complete Independence Rolling to Right Complete Independence Rolling to Left Complete  Independence  TRANSFERS: Assistive device utilized: None  Sit to stand: Complete Independence Stand to sit: Complete Independence Chair to chair: Modified independence  GAIT: Gait pattern:  bilateral ER, decreased trunk rotation, and wide BOS Distance walked: various clinic distances Assistive device utilized:  Right posterior [place] AFO Level of assistance: SBA Comments: Pt is independent in AFO management/donning/doffing.  Pt reports fatigue at end of distance, but denies pain in back when ambulating.  Good arm swing and stride, no gross sway or wandering from pathway.  Pt remains appropriately upright during all short bouts of ambulation.  FUNCTIONAL TESTS:  5 times sit to stand: 19.06 seconds w/ LUE support 10 meter walk test: 14.88 seconds no AD SBA = 0.67 m/sec OR 2.22 ft/sec Dynamic Gait Index: To be assessed.  PATIENT SURVEYS:  None relevant to chief complaint.  TODAY'S TREATMENT:  DATE: 11/18/2022  Vitals:   12/01/22 1505  BP: (!) 154/74  Pulse: 68   TherAct: Discussed need for patient to follow up with Dr. Adaline Sill team as MRI results were in and they had been attempting to reach patient regarding next steps. Educated on need for AD likely as patient wall surfed into the clinic in order to improve stability. Reviewed how results of DGI also indicated this.   Assessment of DGI:  [facility] PT Assessment - 12/01/22 0001       Standardized Balance Assessment   Standardized Balance Assessment Dynamic Gait Index      Dynamic Gait Index   Level Surface Moderate Impairment    Change in Gait Speed Moderate Impairment    Gait with Horizontal Head Turns Moderate Impairment    Gait with Vertical Head Turns Mild Impairment    Gait and Pivot Turn Moderate Impairment    Step Over Obstacle Severe Impairment    Step Around Obstacles Moderate Impairment     Steps Severe Impairment    Total Score 7              Gait Training:   GAIT: Gait pattern: step through pattern, lateral lean- Right, lateral lean- Left, trunk flexed, wide BOS, poor foot clearance- Right, and poor foot clearance- Left Distance walked: 1 x 200 feet Assistive device utilized: None Level of assistance: CGA Comments: Patient holding onto walls to reduce degree of instability, when had to journey into center of room patient hurried with little awareness for surroundings to sit down and required mod verbal cues for safety awareness  GAIT: Gait pattern: step through pattern, wide BOS, poor foot clearance- Right, and poor foot clearance- Left Distance walked: 1 X 115', 1 x 100' Assistive device utilized:  cane with rubber quad tip Level of assistance: SBA and CGA Comments: Improved stability, patient able to perform with step to gait pattern, progressed to SBA  GAIT: Gait pattern: step through pattern, wide BOS, poor foot clearance- Right, and poor foot clearance- Left Distance walked: 1 X 115' Assistive device utilized: rollator Level of assistance: SBA Comments: Reduced awareness of pacing, required cues for appropriate speed   PATIENT EDUCATION: Education details: See above + POC moving forward Person educated: Patient and Spouse Education method: Medical illustrator Education comprehension: verbalized understanding and needs further education  HOME EXERCISE PROGRAM: To be established.  GOALS: Goals reviewed with patient? Yes  SHORT TERM GOALS = LONG TERM GOALS: Target date: 12/18/2022  Pt will be independent and compliant with strength, stretching, and balance focused HEP in order to maintain functional progress and improve mobility. Baseline:  To be established. Goal status: INITIAL  2.  Pt will decrease 5xSTS to </=13 seconds in order to demonstrate decreased risk for falls and improved functional bilateral LE strength and  power. Baseline: 19.06 sec w/ LUE support Goal status: INITIAL  3.  Pt will demonstrate a gait speed of >/=2.62 feet/sec in order to decrease risk for falls. Baseline: 2.22 ft/sec w/o AD using L AFO SBA Goal status: INITIAL  4.  Patient will improve FGA to greater than 14/24  to indicate progress towards a decreased risk of falls and improved dynamic stability.  Baseline: 7/24 Goal status: INITIAL  ASSESSMENT:  CLINICAL IMPRESSION: Session emphasized assessment of DGI in addition to trial of AD. Patient demonstrated high risk for falls as indicated by DGI and reliance for external support from walls walking into clinic. Therapist trialed with patient rollator and quad tip cane. During  today's session, cane appeared more appropriate but recommend further trials in order to recommend most appropriate AD. Patient will benefit from skilled PT to improve safety and reduce the risk for falls. Continue POC.   OBJECTIVE IMPAIRMENTS: Abnormal gait, decreased balance, decreased knowledge of condition, decreased knowledge of use of DME, decreased ROM, decreased strength, impaired flexibility, and improper body mechanics.   ACTIVITY LIMITATIONS: squatting, stairs, transfers, and locomotion level  PARTICIPATION LIMITATIONS: driving, shopping, community activity, and yard work  PERSONAL FACTORS: Age, Fitness, Past/current experiences, Time since onset of injury/illness/exacerbation, and 1-2 comorbidities: IgG multiple myeloma w/ ongoing infusions and HTN  are also affecting patient's functional outcome.   REHAB POTENTIAL: Good  CLINICAL DECISION MAKING: Evolving/moderate complexity  EVALUATION COMPLEXITY: Moderate  PLAN:  PT FREQUENCY: 1x/week  PT DURATION: 4 weeks  PLANNED INTERVENTIONS: Therapeutic exercises, Therapeutic activity, Neuromuscular re-education, Balance training, Gait training, Patient/Family education, Self Care, Joint mobilization, Stair training, Vestibular training,  Orthotic/Fit training, DME instructions, Manual therapy, and Re-evaluation  PLAN FOR NEXT SESSION:   Initiate HEP for ankle strength/ROM/stretching and balance deficits.  Gait training w/ AFO-education on [facility] use (outdoors?), Did patient follow up with physician,  Work on normalized BOS with dynamic activity.   Carmelia Bake, PT, DPT 12/01/2022, 4:10 PM

## 2022-12-02 ENCOUNTER — Other Ambulatory Visit: Payer: Self-pay

## 2022-12-02 DIAGNOSIS — R29898 Other symptoms and signs involving the musculoskeletal system: Secondary | ICD-10-CM

## 2022-12-02 DIAGNOSIS — M21371 Foot drop, right foot: Secondary | ICD-10-CM

## 2022-12-02 LAB — PROTEIN ELECTROPHORESIS, SERUM, WITH REFLEX
A/G Ratio: 1.4 (ref 0.7–1.7)
Albumin ELP: 3.3 g/dL (ref 2.9–4.4)
Alpha-1-Globulin: 0.2 g/dL (ref 0.0–0.4)
Alpha-2-Globulin: 0.7 g/dL (ref 0.4–1.0)
Beta Globulin: 0.9 g/dL (ref 0.7–1.3)
Gamma Globulin: 0.5 g/dL (ref 0.4–1.8)
Globulin, Total: 2.3 g/dL (ref 2.2–3.9)
Total Protein ELP: 5.6 g/dL — ABNORMAL LOW (ref 6.0–8.5)

## 2022-12-02 NOTE — Telephone Encounter (Signed)
Called and spoke w/ pt he had some concerns regarding his results of his MRI,  he did receive a call and the results were discuss with him. However , pt  wanted to know if needs sx , he said that he already doing PT now it not helping. He is said that he really was confused and concerned on he should do , he wants to come for an appt to discuss or rather you  give him a call regarding this.

## 2022-12-02 NOTE — Telephone Encounter (Signed)
Called patient back , Per Dr. Loleta Chance he stated he wanted Robert [name]  see a  Spine Surgeon for an opinion only . Due to results of his MRI. Explain to pt this wasn't for him to have sx. Pt said that he was agreeable to see Spinal Surgeon, sent referral to [facility]  in [place] , advised pt that their office will call him set up an appt. Pt stated that he understood.

## 2022-12-08 ENCOUNTER — Encounter: Payer: Self-pay | Admitting: Physical Therapy

## 2022-12-08 ENCOUNTER — Ambulatory Visit: Payer: Medicare Other | Admitting: Physical Therapy

## 2022-12-08 VITALS — BP 133/78 | HR 65

## 2022-12-08 DIAGNOSIS — M21372 Foot drop, left foot: Secondary | ICD-10-CM

## 2022-12-08 DIAGNOSIS — M6281 Muscle weakness (generalized): Secondary | ICD-10-CM

## 2022-12-08 DIAGNOSIS — R2689 Other abnormalities of gait and mobility: Secondary | ICD-10-CM | POA: Diagnosis not present

## 2022-12-08 DIAGNOSIS — R2681 Unsteadiness on feet: Secondary | ICD-10-CM

## 2022-12-08 NOTE — Therapy (Signed)
OUTPATIENT PHYSICAL THERAPY NEURO TREATMENT   Patient Name: Robert [name] MRN: [id number] DOB:Apr 01, 1934, 89 y.o., male Today's Date: 12/10/2022   PCP: Garlan Fillers, MD REFERRING PROVIDER: Antony Madura, MD  END OF SESSION:    12/08/22 1325  PT Visits / Re-Eval  Visit Number 3  Number of Visits 5  Date for PT Re-Evaluation 01/01/23  Authorization  Authorization Type MEDICARE PART A AND B  Progress Note Due on Visit 10  PT Time Calculation  PT Start Time 1322  PT Stop Time 1400  PT Time Calculation (min) 38 min  PT - End of Session  Equipment Utilized During Treatment Gait belt  Activity Tolerance Patient tolerated treatment well  Behavior During Therapy WFL for tasks assessed/performed    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   Glaucoma of both eyes 10/10/2019   Goals of care, counseling/discussion 05/12/2018   Groin lump 12/28/2016   Hyperlipidemia    Hypertension    IgG multiple myeloma (HCC) 07/23/2004   Iron deficiency anemia 10/07/2018   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   Lytic bone lesions on xray 03/17/2016   Multiple myeloma (HCC)    2006   Multiple myeloma in remission (HCC) 03/19/2010   Myeloma (HCC) 07/02/2011   S/P autologous bone marrow transplantation (HCC) 04/29/2005   Swelling of both lower extremities 10/18/2018   Syncope 12/24/2017   White coat syndrome with diagnosis of hypertension 11/28/2015   Past Surgical History:  Procedure Laterality Date   CATARACT EXTRACTION BILATERAL W/ ANTERIOR VITRECTOMY     IR IMAGING GUIDED PORT INSERTION  05/30/2018   LIMBAL STEM CELL TRANSPLANT     Patient Active Problem List   Diagnosis Date Noted   Multiple  myeloma (HCC)    Hypertension    Hyperlipidemia    Arthritis    Glaucoma of both eyes 10/10/2019   Swelling of both lower extremities 10/18/2018   Iron deficiency anemia 10/07/2018   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   Anemia of chronic renal failure, stage 3 (moderate) (HCC) 10/06/2018   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   Goals of care, counseling/discussion 05/12/2018   Syncope 12/24/2017   Groin lump 12/28/2016   Lytic bone lesions on xray 03/17/2016   Cerumen debris on tympanic membrane 11/28/2015   White coat syndrome with diagnosis of hypertension 11/28/2015   Dyslipidemia 04/08/2015   Myeloma (HCC) 07/02/2011   ED (erectile dysfunction) 06/02/2010   Benign essential hypertension 03/19/2010   Multiple myeloma in remission (HCC) 03/19/2010   B12 deficiency 02/17/2010   S/P autologous bone marrow transplantation (HCC) 04/29/2005   IgG multiple myeloma (HCC) 07/23/2004    ONSET DATE: April 2024 (1-2 weeks before podiatry appt)  REFERRING DIAG: M21.371,M21.372 (ICD-10-CM) - Bilateral foot-drop R29.898 (ICD-10-CM) - Weakness of both lower extremities R26.9 (ICD-10-CM) - Gait abnormality  THERAPY DIAG:  Other abnormalities of gait and mobility  Unsteadiness on feet  Foot drop, left  Muscle weakness (generalized)  Rationale for Evaluation and Treatment: Rehabilitation  SUBJECTIVE:  SUBJECTIVE STATEMENT: Pt arrives to session with new SPC. He states he has been using it outside of house thus far. Denies falls/near falls.  Pt accompanied by: significant other-Wife, [name]  PERTINENT HISTORY: IgG multiple myeloma (infusion once a month), HTN, glaucoma of both eyes, anemia, Chronic renal failure-stage 3  PAIN:  Are you having pain? No  PRECAUTIONS: Fall and Other: right  Port-a-Cath  WEIGHT BEARING RESTRICTIONS: No  FALLS: Has patient fallen in last 6 months? No  LIVING ENVIRONMENT: Lives with: lives with their spouse Lives in: House/apartment Stairs: No Has following equipment at home: Environmental consultant - 2 wheeled and Grab bars  PLOF: Independent  PATIENT GOALS: "I'd like to get it back normal without the drop part."  OBJECTIVE:   DIAGNOSTIC FINDINGS:   IMPRESSION on 11/13/2022: 1. Diffuse lumbar spine spondylosis as described above. 2. No acute osseous injury of the lumbar spine.  COGNITION: Overall cognitive status: Within functional limits for tasks assessed   SENSATION: Light touch: WFL  COORDINATION: Impaired due to lack of active ROM so LE RAMS not formally assessed.  With AFO donned LE heel-to-shin WNL bilaterally.  EDEMA:  Mild edema in left foot, possible fluid volume changes w/ compression from AFO strap in left calf vs possible atrophy; left great toenail appears pinkish/purple with overgrowth-pt states he believes it is dying, but denies pain.  MUSCLE TONE: None noted in BLE  POSTURE: No Significant postural limitations  LOWER EXTREMITY ROM:     Active  Right Eval Left Eval  Hip flexion WNL  Hip extension   Hip abduction   Hip adduction   Hip internal rotation   Hip external rotation   Knee flexion   Knee extension   Ankle dorsiflexion " ~4 degrees  Ankle plantarflexion " "  Ankle inversion    Ankle eversion     (Blank rows = not tested)  LOWER EXTREMITY MMT:    MMT Right Eval Left Eval  Hip flexion 5/5 4+/5  Hip extension    Hip abduction 4+/5 4+/5  Hip adduction 4+/5 4+/5  Hip internal rotation    Hip external rotation    Knee flexion 4/5 4/5  Knee extension 5/5 5/5  Ankle dorsiflexion 3/5 1+/5  Ankle plantarflexion    Ankle inversion    Ankle eversion    (Blank rows = not tested)  BED MOBILITY:  Sit to supine Complete Independence Supine to sit Complete Independence Rolling to Right Complete  Independence Rolling to Left Complete Independence  TRANSFERS: Assistive device utilized: None  Sit to stand: Complete Independence Stand to sit: Complete Independence Chair to chair: Modified independence  GAIT: Gait pattern:  bilateral ER, decreased trunk rotation, and wide BOS Distance walked: various clinic distances Assistive device utilized:  Right posterior [place] AFO Level of assistance: SBA Comments: Pt is independent in AFO management/donning/doffing.  Pt reports fatigue at end of distance, but denies pain in back when ambulating.  Good arm swing and stride, no gross sway or wandering from pathway.  Pt remains appropriately upright during all short bouts of ambulation.  FUNCTIONAL TESTS:  5 times sit to stand: 19.06 seconds w/ LUE support 10 meter walk test: 14.88 seconds no AD SBA = 0.67 m/sec OR 2.22 ft/sec Dynamic Gait Index: To be assessed.  PATIENT SURVEYS:  None relevant to chief complaint.  TODAY'S TREATMENT:  DATE: 11/18/2022  Vitals:   12/08/22 1334  BP: 133/78  Pulse: 65    Gait Training:   GAIT: Gait pattern: step through pattern, wide BOS, poor foot clearance- Right, and poor foot clearance- Left Distance walked: > 500 feet indoors/outdoors (required seated break) Assistive device utilized:  cane with rubber quad tip Level of assistance: SBA Comments: Improved stability, patient able to perform with step through gait pattern, progressed to SBA; able to navigate through grass, through gravel, sand, and rubber mulch  RAMP:  Level of Assistance: SBA Assistive device utilized: Single point cane Ramp Comments: no major signs of instability appropriate pacing  CURB:  Level of Assistance: SBA and CGA Assistive device utilized: Single point cane Curb Comments: Performed 3x initially with SPC and required education on proper  sequencing, progressed to SBA without prompting for final rep  PATIENT EDUCATION: Education details: Use of SPC safety Person educated: Patient and Spouse Education method: Medical illustrator Education comprehension: verbalized understanding and needs further education  HOME EXERCISE PROGRAM: To be established.  GOALS: Goals reviewed with patient? Yes  SHORT TERM GOALS = LONG TERM GOALS: Target date: 12/18/2022  Pt will be independent and compliant with strength, stretching, and balance focused HEP in order to maintain functional progress and improve mobility. Baseline:  To be established. Goal status: INITIAL  2.  Pt will decrease 5xSTS to </=13 seconds in order to demonstrate decreased risk for falls and improved functional bilateral LE strength and power. Baseline: 19.06 sec w/ LUE support Goal status: INITIAL  3.  Pt will demonstrate a gait speed of >/=2.62 feet/sec in order to decrease risk for falls. Baseline: 2.22 ft/sec w/o AD using L AFO SBA Goal status: INITIAL  4.  Patient will improve FGA to greater than 14/24  to indicate progress towards a decreased risk of falls and improved dynamic stability.  Baseline: 7/24 Goal status: INITIAL  ASSESSMENT:  CLINICAL IMPRESSION: Session emphasized gait training with patient's new AD in both indoor and outdoor setting. Greatly improves independence and stability while reducing [place]. Patient safe with SBA. Requires seated break before end of session before ambulating out due to fatigue. Patient safe with AD based on today's session. Continue POC.   OBJECTIVE IMPAIRMENTS: Abnormal gait, decreased balance, decreased knowledge of condition, decreased knowledge of use of DME, decreased ROM, decreased strength, impaired flexibility, and improper body mechanics.   ACTIVITY LIMITATIONS: squatting, stairs, transfers, and locomotion level  PARTICIPATION LIMITATIONS: driving, shopping, community activity, and yard  work  PERSONAL FACTORS: Age, Fitness, Past/current experiences, Time since onset of injury/illness/exacerbation, and 1-2 comorbidities: IgG multiple myeloma w/ ongoing infusions and HTN  are also affecting patient's functional outcome.   REHAB POTENTIAL: Good  CLINICAL DECISION MAKING: Evolving/moderate complexity  EVALUATION COMPLEXITY: Moderate  PLAN:  PT FREQUENCY: 1x/week  PT DURATION: 4 weeks  PLANNED INTERVENTIONS: Therapeutic exercises, Therapeutic activity, Neuromuscular re-education, Balance training, Gait training, Patient/Family education, Self Care, Joint mobilization, Stair training, Vestibular training, Orthotic/Fit training, DME instructions, Manual therapy, and Re-evaluation  PLAN FOR NEXT SESSION:   Initiate HEP for ankle strength/ROM/stretching and balance deficits.  Gait training w/ AFO-education on [facility] use (outdoors?), Did patient follow up with physician,  Work on normalized BOS with dynamic activity.  Carmelia Bake, PT, DPT 12/10/2022, 6:05 PM

## 2022-12-10 ENCOUNTER — Encounter: Payer: Self-pay | Admitting: Physical Therapy

## 2022-12-14 NOTE — Progress Notes (Unsigned)
Referring Physician:  Antony Madura, MD [address] [place],  [place] [address]  Primary Physician:  Garlan Fillers, MD  History of Present Illness: 12/14/2022 Robert [name] is here today with a chief complaint of *** Left foot drop Duration/Date of Injury: ***3 months Mechanism of Injury: ***inability to raise left foot Specific Nerve if known: {ANATOMY; NERVES:23155} Weakness: ***, {Improving/worsening/no change:60406} Pain ***, {IMPROVED DECREASED NO CHANGE:22066} Numbness ***, {IMPROVED DECREASED NO CHANGE:22066}  Conservative measures:  Physical therapy: {yes/no:20286} Occupational Therapy: {yes/no:20286} Hand Therapy: {yes/no:20286} Injections: {yes/no:20286} Gabapentin: No, Lyrica: No, Cymbalta: No  Past Surgery: No Is this a Second Opinion: {yes/no:20286}  Main Question for Surgeon: ***  Review of Systems:  A 10 point review of systems is negative, except for the pertinent positives and negatives detailed in the HPI.  Past Medical History: 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   Glaucoma of both eyes 10/10/2019   Goals of care, counseling/discussion 05/12/2018   Groin lump 12/28/2016   Hyperlipidemia    Hypertension    IgG multiple myeloma (HCC) 07/23/2004   Iron deficiency anemia 10/07/2018   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   Lytic bone lesions on xray 03/17/2016   Multiple myeloma (HCC)    2006   Multiple myeloma in remission (HCC) 03/19/2010   Myeloma (HCC) 07/02/2011   S/P autologous bone marrow transplantation (HCC) 04/29/2005   Swelling of both lower extremities 10/18/2018   Syncope 12/24/2017   White coat syndrome with diagnosis of  hypertension 11/28/2015    Past Surgical History: Past Surgical History:  Procedure Laterality Date   CATARACT EXTRACTION BILATERAL W/ ANTERIOR VITRECTOMY     IR IMAGING GUIDED PORT INSERTION  05/30/2018   LIMBAL STEM CELL TRANSPLANT      Allergies: Allergies as of 12/23/2022   (No Known Allergies)    Medications:  Current Outpatient Medications:    acetaminophen-codeine (TYLENOL #3) 300-30 MG tablet, Take 1 tablet by mouth every 8 (eight) hours as needed for moderate pain., Disp: 30 tablet, Rfl: 2   aspirin 81 MG tablet, Take 81 mg by mouth daily., Disp: , Rfl:    benzonatate (TESSALON) 100 MG capsule, Take 1 capsule (100 mg total) by mouth 3 (three) times daily as needed for cough., Disp: 60 capsule, Rfl: 4   bimatoprost (LUMIGAN) 0.01 % SOLN, Place 1 drop into both eyes at bedtime. , Disp: , Rfl:    Calcium Carbonate-Vit D-Min (CALTRATE 600+D PLUS PO), Take 1 tablet by mouth daily., Disp: , Rfl:    carvedilol (COREG) 25 MG tablet, Take 25 mg by mouth 2 (two) times daily., Disp: , Rfl:    Cholecalciferol (VITAMIN D3) 1000 UNITS CAPS, Take 1,000 Units by mouth daily. , Disp: , Rfl:    ciclopirox (LOPROX) 0.77 % cream, Apply 1 application topically 2 (two) times daily. As needed, Disp: , Rfl:    cloNIDine (CATAPRES) 0.1 MG tablet, Take 0.1 mg by mouth 2 (two) times daily as needed (Elevated BP)., Disp: , Rfl:    cyanocobalamin 2000 MCG tablet, Take 2,000 mcg by mouth at bedtime. , Disp: , Rfl:    daratumumab (DARZALEX) 400 MG/20ML, Inject 20 mLs into the vein every 30 (  thirty) days., Disp: , Rfl:    dexamethasone (DECADRON) 4 MG tablet, Take 5 tablets prior to immunotherapy treatment (Patient taking differently: Take 4 mg by mouth See admin instructions. Take 5 tablets prior to immunotherapy treatment), Disp: 24 tablet, Rfl: 2   diclofenac Sodium (VOLTAREN) 1 % GEL, Apply 2 g topically 4 (four) times daily. As needed, Disp: , Rfl:    famciclovir (FAMVIR) 250 MG tablet, TAKE 1 TABLET DAILY  (Patient taking differently: Take 250 mg by mouth daily.), Disp: 90 tablet, Rfl: 3   fluticasone (FLONASE ALLERGY RELIEF) 50 MCG/ACT nasal spray, Place 1 spray into both nostrils daily., Disp: , Rfl:    furosemide (LASIX) 20 MG tablet, Take 2 tablets (40 mg total) by mouth daily., Disp: 180 tablet, Rfl: 1   losartan (COZAAR) 100 MG tablet, Take 100 mg by mouth daily., Disp: , Rfl:    meloxicam (MOBIC) 15 MG tablet, Take 1 tablet by mouth daily as needed for pain., Disp: , Rfl:    metolazone (ZAROXOLYN) 5 MG tablet, TAKE 1 TABLET 1 HOUR PRIOR TO TAKING LASIX DAILY (Patient taking differently: Take 5 mg by mouth daily. TAKE 1 TABLET 1 HOUR PRIOR TO TAKING LASIX DAILY), Disp: 180 tablet, Rfl: 3   montelukast (SINGULAIR) 10 MG tablet, TAKE 1 TABLET AT BEDTIME, Disp: 30 tablet, Rfl: 11   Multiple Vitamin (MULITIVITAMIN WITH MINERALS) TABS, Take 1 tablet by mouth daily. Unknown strenght, Disp: , Rfl:    omeprazole (PRILOSEC) 20 MG capsule, Take 1 capsule by mouth daily before breakfast., Disp: , Rfl:    Polyethyl Glycol-Propyl Glycol (SYSTANE OP), Place 1 drop into both eyes daily as needed (dry eyes)., Disp: , Rfl:    potassium chloride SA (KLOR-CON M) 20 MEQ tablet, Take 1 tablet (20 mEq total) by mouth daily., Disp: 30 tablet, Rfl: 3   rosuvastatin (CRESTOR) 20 MG tablet, Take 20 mg by mouth at bedtime., Disp: , Rfl:    Zoledronic Acid (ZOMETA) 4 MG/100ML IVPB, Inject 4 mg into the vein every 30 (thirty) days. 07/03/2022 Every 4 months., Disp: , Rfl:  No current facility-administered medications for this visit.  Facility-Administered Medications Ordered in Other Visits:    sodium chloride flush (NS) 0.9 % injection 10 mL, 10 mL, Intravenous, PRN, Josph Macho, MD, 10 mL at 07/28/18 1313   sodium chloride flush (NS) 0.9 % injection 10 mL, 10 mL, Intravenous, PRN, Josph Macho, MD, 10 mL at 09/01/18 0931   sodium chloride flush (NS) 0.9 % injection 10 mL, 10 mL, Intravenous, PRN, Josph Macho,  MD, 10 mL at 09/08/18 1009  Social History: Social History   Tobacco Use   Smoking status: Former    Current packs/day: 0.00    Average packs/day: 4.0 packs/day for 9.0 years (36.0 ttl pk-yrs)    Types: Cigars, Cigarettes    Start date: 07/08/1979    Quit date: 07/07/1988    Years since quitting: 34.4   Smokeless tobacco: Never   Tobacco comments:    quit 24 years ago  Vaping Use   Vaping status: Never Used  Substance Use Topics   Alcohol use: Yes    Alcohol/week: 6.0 standard drinks of alcohol    Types: 6 Glasses of wine per week    Comment: very seldom   Drug use: No    Family Medical History: Family History  Problem Relation Age of Onset   Heart attack Father    Heart disease Father    Throat cancer Mother  Diabetes Brother    Colon cancer Neg Hx     Physical Examination: There were no vitals filed for this visit.  General: Patient is in no apparent distress. Attention to examination is appropriate.  Neck:   Supple.  Full range of motion.  Respiratory: Patient is breathing without any difficulty.   NEUROLOGICAL:     Awake, alert, oriented to person, place, and time.  Speech is clear and fluent.   Cranial Nerves: Pupils equal round and reactive to light.  Facial tone is symmetric. Shoulder shrug is symmetric. Tongue protrusion is midline.  There is no pronator drift.  Motor Exam:  ***  Reflexes are ***2+ and symmetric at the biceps, triceps, brachioradialis, patella and achilles.   Hoffman's is absent. Clonus is Absent  Bilateral upper and lower extremity sensation is intact to light touch ***.     Gait is normal.  ***   Medical Decision Making  Imaging: ***  Electrodiagnostics: ***  I have personally reviewed the images and electrodiagnostics and agree with the above interpretation.  Assessment and Plan: Robert [name] is a pleasant 89 y.o. male with ***. The symptoms are causing a significant impact on the patient's life. I have utilized the care  everywhere function in epic to review the outside records available from external health systems, and have personally reviewed relevant imaging and electrodiagnostic workup.   There are no diagnoses linked to this encounter.    Thank you for involving me in the care of this patient.    Lovenia Kim MD/MSCR Neurosurgery - Peripheral Nerve Surgery

## 2022-12-15 ENCOUNTER — Ambulatory Visit: Payer: Medicare Other | Admitting: Physical Therapy

## 2022-12-15 ENCOUNTER — Encounter: Payer: Self-pay | Admitting: Physical Therapy

## 2022-12-15 VITALS — BP 106/65 | HR 68

## 2022-12-15 DIAGNOSIS — R2689 Other abnormalities of gait and mobility: Secondary | ICD-10-CM

## 2022-12-15 DIAGNOSIS — M6281 Muscle weakness (generalized): Secondary | ICD-10-CM

## 2022-12-15 DIAGNOSIS — R2681 Unsteadiness on feet: Secondary | ICD-10-CM

## 2022-12-15 DIAGNOSIS — M21372 Foot drop, left foot: Secondary | ICD-10-CM

## 2022-12-15 NOTE — Therapy (Signed)
OUTPATIENT PHYSICAL THERAPY NEURO TREATMENT   Patient Name: Robert [name] MRN: [id number] DOB:February 12, 1934, 89 y.o., male Today's Date: 12/15/2022   PCP: Garlan Fillers, MD REFERRING PROVIDER: Antony Madura, MD  END OF SESSION:  PT End of Session - 12/15/22 1318     Visit Number 4    Number of Visits 5    Date for PT Re-Evaluation 01/01/23    Authorization Type MEDICARE PART A AND B    Progress Note Due on Visit 10    PT Start Time 1316    PT Stop Time 1400    PT Time Calculation (min) 44 min    Equipment Utilized During Treatment Gait belt    Activity Tolerance Patient tolerated treatment well    Behavior During Therapy WFL for tasks assessed/performed            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   Glaucoma of both eyes 10/10/2019   Goals of care, counseling/discussion 05/12/2018   Groin lump 12/28/2016   Hyperlipidemia    Hypertension    IgG multiple myeloma (HCC) 07/23/2004   Iron deficiency anemia 10/07/2018   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   Lytic bone lesions on xray 03/17/2016   Multiple myeloma (HCC)    2006   Multiple myeloma in remission (HCC) 03/19/2010   Myeloma (HCC) 07/02/2011   S/P autologous bone marrow transplantation (HCC) 04/29/2005   Swelling of both lower extremities 10/18/2018   Syncope 12/24/2017   White coat syndrome with diagnosis of hypertension 11/28/2015   Past Surgical History:  Procedure Laterality Date   CATARACT EXTRACTION BILATERAL W/ ANTERIOR VITRECTOMY     IR IMAGING GUIDED PORT INSERTION  05/30/2018   LIMBAL STEM CELL TRANSPLANT     Patient Active Problem List   Diagnosis Date Noted   Multiple myeloma (HCC)    Hypertension     Hyperlipidemia    Arthritis    Glaucoma of both eyes 10/10/2019   Swelling of both lower extremities 10/18/2018   Iron deficiency anemia 10/07/2018   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   Anemia of chronic renal failure, stage 3 (moderate) (HCC) 10/06/2018   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   Goals of care, counseling/discussion 05/12/2018   Syncope 12/24/2017   Groin lump 12/28/2016   Lytic bone lesions on xray 03/17/2016   Cerumen debris on tympanic membrane 11/28/2015   White coat syndrome with diagnosis of hypertension 11/28/2015   Dyslipidemia 04/08/2015   Myeloma (HCC) 07/02/2011   ED (erectile dysfunction) 06/02/2010   Benign essential hypertension 03/19/2010   Multiple myeloma in remission (HCC) 03/19/2010   B12 deficiency 02/17/2010   S/P autologous bone marrow transplantation (HCC) 04/29/2005   IgG multiple myeloma (HCC) 07/23/2004    ONSET DATE: April 2024 (1-2 weeks before podiatry appt)  REFERRING DIAG: M21.371,M21.372 (ICD-10-CM) - Bilateral foot-drop R29.898 (ICD-10-CM) - Weakness of both lower extremities R26.9 (ICD-10-CM) - Gait abnormality  THERAPY DIAG:  Other abnormalities of gait and mobility  Unsteadiness on feet  Foot drop, left  Muscle weakness (generalized)  Rationale for Evaluation and Treatment: Rehabilitation  SUBJECTIVE:  SUBJECTIVE STATEMENT: Patient arrives to session with [facility] today. Patient denies any falls/near falls.   Pt accompanied by: self  PERTINENT HISTORY: IgG multiple myeloma (infusion once a month), HTN, glaucoma of both eyes, anemia, Chronic renal failure-stage 3  PAIN:  Are you having pain? No  PRECAUTIONS: Fall and Other: right Port-a-Cath  WEIGHT BEARING RESTRICTIONS: No  FALLS: Has patient fallen in last 6 months?  No  LIVING ENVIRONMENT: Lives with: lives with their spouse Lives in: House/apartment Stairs: No Has following equipment at home: Environmental consultant - 2 wheeled and Grab bars  PLOF: Independent  PATIENT GOALS: "I'd like to get it back normal without the drop part."  OBJECTIVE:   DIAGNOSTIC FINDINGS:   IMPRESSION on 11/13/2022: 1. Diffuse lumbar spine spondylosis as described above. 2. No acute osseous injury of the lumbar spine.  COGNITION: Overall cognitive status: Within functional limits for tasks assessed   SENSATION: Light touch: WFL  COORDINATION: Impaired due to lack of active ROM so LE RAMS not formally assessed.  With AFO donned LE heel-to-shin WNL bilaterally.  EDEMA:  Mild edema in left foot, possible fluid volume changes w/ compression from AFO strap in left calf vs possible atrophy; left great toenail appears pinkish/purple with overgrowth-pt states he believes it is dying, but denies pain.  MUSCLE TONE: None noted in BLE  POSTURE: No Significant postural limitations  LOWER EXTREMITY ROM:     Active  Right Eval Left Eval  Hip flexion WNL  Hip extension   Hip abduction   Hip adduction   Hip internal rotation   Hip external rotation   Knee flexion   Knee extension   Ankle dorsiflexion " ~4 degrees  Ankle plantarflexion " "  Ankle inversion    Ankle eversion     (Blank rows = not tested)  LOWER EXTREMITY MMT:    MMT Right Eval Left Eval  Hip flexion 5/5 4+/5  Hip extension    Hip abduction 4+/5 4+/5  Hip adduction 4+/5 4+/5  Hip internal rotation    Hip external rotation    Knee flexion 4/5 4/5  Knee extension 5/5 5/5  Ankle dorsiflexion 3/5 1+/5  Ankle plantarflexion    Ankle inversion    Ankle eversion    (Blank rows = not tested)  BED MOBILITY:  Sit to supine Complete Independence Supine to sit Complete Independence Rolling to Right Complete Independence Rolling to Left Complete Independence  TRANSFERS: Assistive device utilized: None   Sit to stand: Complete Independence Stand to sit: Complete Independence Chair to chair: Modified independence  GAIT: Gait pattern:  bilateral ER, decreased trunk rotation, and wide BOS Distance walked: various clinic distances Assistive device utilized:  Right posterior [place] AFO Level of assistance: SBA Comments: Pt is independent in AFO management/donning/doffing.  Pt reports fatigue at end of distance, but denies pain in back when ambulating.  Good arm swing and stride, no gross sway or wandering from pathway.  Pt remains appropriately upright during all short bouts of ambulation.  FUNCTIONAL TESTS:  5 times sit to stand: 19.06 seconds w/ LUE support 10 meter walk test: 14.88 seconds no AD SBA = 0.67 m/sec OR 2.22 ft/sec Dynamic Gait Index: To be assessed.  PATIENT SURVEYS:  None relevant to chief complaint.  TODAY'S TREATMENT:  DATE: 11/18/2022  Vitals:   12/15/22 1323  BP: 106/65  Pulse: 68   TherEx (Created Initial HEP + reviewed with patient during session): - Seated Calf Stretch with Strap  - 3 sets - 30 seconds hold - Standing Balance in Corner with Eyes Closed  -  3 sets - 30 seconds hold - Tandem Stance  - 3 sets - 30 seconds hold - Standing March with Counter Support  - 3 sets - 15 reps - Side Stepping with Counter Support  - 3 sets x 10 feet  PATIENT EDUCATION: Education details: Initial HEP  Person educated: Patient Education method: Explanation, Demonstration, and Handouts Education comprehension: verbalized understanding and needs further education  HOME EXERCISE PROGRAM:  Access Code: [email or web address] URL: [email or web address] Date: 12/15/2022 Prepared by: Maryruth Eve  Exercises - Seated Calf Stretch with Strap  - 1 x daily - 7 x weekly - 3 sets - 30 seconds hold - Standing Balance in Corner with Eyes Closed  - 1  x daily - 7 x weekly - 3 sets - 30 seconds hold - Tandem Stance  - 1 x daily - 7 x weekly - 3 sets - 30 seconds hold - Standing March with Counter Support  - 1 x daily - 7 x weekly - 3 sets - 15 reps - Side Stepping with Counter Support  - 1 x daily - 7 x weekly - 3 sets   GOALS: Goals reviewed with patient? Yes  SHORT TERM GOALS = LONG TERM GOALS: Target date: 12/18/2022  Pt will be independent and compliant with strength, stretching, and balance focused HEP in order to maintain functional progress and improve mobility. Baseline:  To be established. Goal status: INITIAL  2.  Pt will decrease 5xSTS to </=13 seconds in order to demonstrate decreased risk for falls and improved functional bilateral LE strength and power. Baseline: 19.06 sec w/ LUE support Goal status: INITIAL  3.  Pt will demonstrate a gait speed of >/=2.62 feet/sec in order to decrease risk for falls. Baseline: 2.22 ft/sec w/o AD using L AFO SBA Goal status: INITIAL  4.  Patient will improve FGA to greater than 14/24  to indicate progress towards a decreased risk of falls and improved dynamic stability.  Baseline: 7/24 Goal status: INITIAL  ASSESSMENT:  CLINICAL IMPRESSION: Session emphasized creation of HEP to address patient's balance, ROM, and proximal stabilty challenges. Given only trace strength in LLE did not provided strengthening on this side as was non functional but provide exercise to maintain ROM while worked on proximal stability/balance for compensation. Patient tolerated well. Continue POC.   OBJECTIVE IMPAIRMENTS: Abnormal gait, decreased balance, decreased knowledge of condition, decreased knowledge of use of DME, decreased ROM, decreased strength, impaired flexibility, and improper body mechanics.   ACTIVITY LIMITATIONS: squatting, stairs, transfers, and locomotion level  PARTICIPATION LIMITATIONS: driving, shopping, community activity, and yard work  PERSONAL FACTORS: Age, Fitness, Past/current  experiences, Time since onset of injury/illness/exacerbation, and 1-2 comorbidities: IgG multiple myeloma w/ ongoing infusions and HTN  are also affecting patient's functional outcome.   REHAB POTENTIAL: Good  CLINICAL DECISION MAKING: Evolving/moderate complexity  EVALUATION COMPLEXITY: Moderate  PLAN:  PT FREQUENCY: 1x/week  PT DURATION: 4 weeks  PLANNED INTERVENTIONS: Therapeutic exercises, Therapeutic activity, Neuromuscular re-education, Balance training, Gait training, Patient/Family education, Self Care, Joint mobilization, Stair training, Vestibular training, Orthotic/Fit training, DME instructions, Manual therapy, and Re-evaluation  PLAN FOR NEXT SESSION:  Plan for possible D/C review HEP as needed  Carmelia Bake, PT, DPT 12/15/2022,  5:50 PM

## 2022-12-22 ENCOUNTER — Ambulatory Visit: Payer: Medicare Other | Admitting: Physical Therapy

## 2022-12-22 ENCOUNTER — Encounter: Payer: Self-pay | Admitting: Physical Therapy

## 2022-12-22 DIAGNOSIS — R2689 Other abnormalities of gait and mobility: Secondary | ICD-10-CM

## 2022-12-22 DIAGNOSIS — R2681 Unsteadiness on feet: Secondary | ICD-10-CM

## 2022-12-22 DIAGNOSIS — M21372 Foot drop, left foot: Secondary | ICD-10-CM

## 2022-12-22 DIAGNOSIS — M6281 Muscle weakness (generalized): Secondary | ICD-10-CM

## 2022-12-22 NOTE — Therapy (Unsigned)
OUTPATIENT PHYSICAL THERAPY NEURO TREATMENT   Patient Name: Robert [name] MRN: [id number] DOB:1933-10-31, 89 y.o., male Today's Date: 12/22/2022   PCP: Garlan Fillers, MD REFERRING PROVIDER: Antony Madura, MD  END OF SESSION:  PT End of Session - 12/22/22 1422     Visit Number 5    Number of Visits 5    Date for PT Re-Evaluation 01/01/23    Authorization Type MEDICARE PART A AND B    Progress Note Due on Visit 10    PT Start Time 1406    PT Stop Time 1450    PT Time Calculation (min) 44 min    Equipment Utilized During Treatment Gait belt    Activity Tolerance Patient tolerated treatment well    Behavior During Therapy WFL for tasks assessed/performed            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   Glaucoma of both eyes 10/10/2019   Goals of care, counseling/discussion 05/12/2018   Groin lump 12/28/2016   Hyperlipidemia    Hypertension    IgG multiple myeloma (HCC) 07/23/2004   Iron deficiency anemia 10/07/2018   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   Lytic bone lesions on xray 03/17/2016   Multiple myeloma (HCC)    2006   Multiple myeloma in remission (HCC) 03/19/2010   Myeloma (HCC) 07/02/2011   S/P autologous bone marrow transplantation (HCC) 04/29/2005   Swelling of both lower extremities 10/18/2018   Syncope 12/24/2017   White coat syndrome with diagnosis of hypertension 11/28/2015   Past Surgical History:  Procedure Laterality Date   CATARACT EXTRACTION BILATERAL W/ ANTERIOR VITRECTOMY     IR IMAGING GUIDED PORT INSERTION  05/30/2018   LIMBAL STEM CELL TRANSPLANT     Patient Active Problem List   Diagnosis Date Noted   Multiple myeloma (HCC)    Hypertension     Hyperlipidemia    Arthritis    Glaucoma of both eyes 10/10/2019   Swelling of both lower extremities 10/18/2018   Iron deficiency anemia 10/07/2018   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   Anemia of chronic renal failure, stage 3 (moderate) (HCC) 10/06/2018   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   Goals of care, counseling/discussion 05/12/2018   Syncope 12/24/2017   Groin lump 12/28/2016   Lytic bone lesions on xray 03/17/2016   Cerumen debris on tympanic membrane 11/28/2015   White coat syndrome with diagnosis of hypertension 11/28/2015   Dyslipidemia 04/08/2015   Myeloma (HCC) 07/02/2011   ED (erectile dysfunction) 06/02/2010   Benign essential hypertension 03/19/2010   Multiple myeloma in remission (HCC) 03/19/2010   B12 deficiency 02/17/2010   S/P autologous bone marrow transplantation (HCC) 04/29/2005   IgG multiple myeloma (HCC) 07/23/2004    ONSET DATE: April 2024 (1-2 weeks before podiatry appt)  REFERRING DIAG: M21.371,M21.372 (ICD-10-CM) - Bilateral foot-drop R29.898 (ICD-10-CM) - Weakness of both lower extremities R26.9 (ICD-10-CM) - Gait abnormality  THERAPY DIAG:  Other abnormalities of gait and mobility  Unsteadiness on feet  Foot drop, left  Muscle weakness (generalized)  Rationale for Evaluation and Treatment: Rehabilitation  SUBJECTIVE:  SUBJECTIVE STATEMENT: Patient arrives to session with SPC and left AFO donned today. Patient denies any falls/near falls.  He has a lot of questions about his foot drop today as he has a neurosurgery appt tomorrow.  Pt accompanied by: self  PERTINENT HISTORY: IgG multiple myeloma (infusion once a month), HTN, glaucoma of both eyes, anemia, Chronic renal failure-stage 3  PAIN:  Are you having pain? No  PRECAUTIONS: Fall and  Other: right Port-a-Cath  WEIGHT BEARING RESTRICTIONS: No  FALLS: Has patient fallen in last 6 months? No  LIVING ENVIRONMENT: Lives with: lives with their spouse Lives in: House/apartment Stairs: No Has following equipment at home: Environmental consultant - 2 wheeled and Grab bars  PLOF: Independent  PATIENT GOALS: "I'd like to get it back normal without the drop part."  OBJECTIVE:   DIAGNOSTIC FINDINGS:   IMPRESSION on 11/13/2022: 1. Diffuse lumbar spine spondylosis as described above. 2. No acute osseous injury of the lumbar spine.  COGNITION: Overall cognitive status: Within functional limits for tasks assessed   SENSATION: Light touch: WFL  COORDINATION: Impaired due to lack of active ROM so LE RAMS not formally assessed.  With AFO donned LE heel-to-shin WNL bilaterally.  EDEMA:  Mild edema in left foot, possible fluid volume changes w/ compression from AFO strap in left calf vs possible atrophy; left great toenail appears pinkish/purple with overgrowth-pt states he believes it is dying, but denies pain.  MUSCLE TONE: None noted in BLE  POSTURE: No Significant postural limitations  LOWER EXTREMITY ROM:     Active  Right Eval Left Eval  Hip flexion WNL  Hip extension   Hip abduction   Hip adduction   Hip internal rotation   Hip external rotation   Knee flexion   Knee extension   Ankle dorsiflexion " ~4 degrees  Ankle plantarflexion " "  Ankle inversion    Ankle eversion     (Blank rows = not tested)  LOWER EXTREMITY MMT:    MMT Right Eval Left Eval  Hip flexion 5/5 4+/5  Hip extension    Hip abduction 4+/5 4+/5  Hip adduction 4+/5 4+/5  Hip internal rotation    Hip external rotation    Knee flexion 4/5 4/5  Knee extension 5/5 5/5  Ankle dorsiflexion 3/5 1+/5  Ankle plantarflexion    Ankle inversion    Ankle eversion    (Blank rows = not tested)  BED MOBILITY:  Sit to supine Complete Independence Supine to sit Complete Independence Rolling to Right  Complete Independence Rolling to Left Complete Independence  TRANSFERS: Assistive device utilized: None  Sit to stand: Complete Independence Stand to sit: Complete Independence Chair to chair: Modified independence  GAIT: Gait pattern:  bilateral ER, decreased trunk rotation, and wide BOS Distance walked: various clinic distances Assistive device utilized:  Right posterior [place] AFO Level of assistance: SBA Comments: Pt is independent in AFO management/donning/doffing.  Pt reports fatigue at end of distance, but denies pain in back when ambulating.  Good arm swing and stride, no gross sway or wandering from pathway.  Pt remains appropriately upright during all short bouts of ambulation.  FUNCTIONAL TESTS:  5 times sit to stand: 19.06 seconds w/ LUE support 10 meter walk test: 14.88 seconds no AD SBA = 0.67 m/sec OR 2.22 ft/sec Dynamic Gait Index: To be assessed.  PATIENT SURVEYS:  None relevant to chief complaint.  TODAY'S TREATMENT:  DATE: 12/22/2022  There were no vitals filed for this visit.  -5xSTS:  16.41 sec w/ BUE - w/ SPC:  15.25 seconds SBA = 0.66 m/sec OR 2.16 ft/sec  PATIENT EDUCATION: Education details: Compliance to HEP, pt reports not needing reprint.  PT answers variety of questions at onset of session regarding the following:  HEP form and alternative strap options for foot stretch, why keeping heel cord stretched is beneficial to bracing, bracing appropriateness and relevance to fall risk, discouraged purchasing or using alternative brace options from [employer] as pt has looked up, potential causes or contributions to foot drop and why he is seeing neurosurgery tomorrow, encouraged him to discuss further medical management w/ surgeon as PT has addressed his deficits and he is at a functional baseline.  Patient requires redirection to  tasks throughout session as he perseverates on a cure for his foot drop.  Discussed progress towards goals.  PT is recommending use of cane and AFO full-time for lowest fall risk. Person educated: Patient Education method: Explanation, Demonstration, and Handouts Education comprehension: verbalized understanding and needs further education  HOME EXERCISE PROGRAM:  Access Code: [email or web address] URL: [email or web address] Date: 12/15/2022 Prepared by: Maryruth Eve  Exercises - Seated Calf Stretch with Strap  - 1 x daily - 7 x weekly - 3 sets - 30 seconds hold - Standing Balance in Corner with Eyes Closed  - 1 x daily - 7 x weekly - 3 sets - 30 seconds hold - Tandem Stance  - 1 x daily - 7 x weekly - 3 sets - 30 seconds hold - Standing March with Counter Support  - 1 x daily - 7 x weekly - 3 sets - 15 reps - Side Stepping with Counter Support  - 1 x daily - 7 x weekly - 3 sets  -STS w/ armrest support  GOALS: Goals reviewed with patient? Yes  SHORT TERM GOALS = LONG TERM GOALS: Target date: 12/18/2022  Pt will be independent and compliant with strength, stretching, and balance focused HEP in order to maintain functional progress and improve mobility. Baseline:  Pt has only done HEP twice since last visit (7/30) Goal status: PARTIALLY MET  2.  Pt will decrease 5xSTS to </=13 seconds in order to demonstrate decreased risk for falls and improved functional bilateral LE strength and power. Baseline: 19.06 sec w/ LUE support; 16.41 sec w/ BUE (7/30) Goal status: PARTIALLY MET  3.  Pt will demonstrate a gait speed of >/=2.62 feet/sec in order to decrease risk for falls. Baseline: 2.22 ft/sec w/o AD using L AFO SBA; 2.16 ft/sec w/ SPC and L AFO Goal status: NOT MET  4.  Patient will improve FGA to greater than 14/24  to indicate progress towards a decreased risk of falls and improved dynamic stability.  Baseline: 7/24; 11/30 (7/30) Goal status: PARTIALLY  MET  ASSESSMENT:  CLINICAL IMPRESSION: Session emphasized creation of HEP to address patient's balance, ROM, and proximal stabilty challenges. Given only trace strength in LLE did not provided strengthening on this side as was non functional but provide exercise to maintain ROM while worked on proximal stability/balance for compensation. Patient tolerated well. Continue POC.   OBJECTIVE IMPAIRMENTS: Abnormal gait, decreased balance, decreased knowledge of condition, decreased knowledge of use of DME, decreased ROM, decreased strength, impaired flexibility, and improper body mechanics.   ACTIVITY LIMITATIONS: squatting, stairs, transfers, and locomotion level  PARTICIPATION LIMITATIONS: driving, shopping, community activity, and yard work  PERSONAL FACTORS: Age, Fitness, Past/current experiences, Time since onset of  injury/illness/exacerbation, and 1-2 comorbidities: IgG multiple myeloma w/ ongoing infusions and HTN  are also affecting patient's functional outcome.   REHAB POTENTIAL: Good  CLINICAL DECISION MAKING: Evolving/moderate complexity  EVALUATION COMPLEXITY: Moderate  PLAN:  PT FREQUENCY: 1x/week  PT DURATION: 4 weeks  PLANNED INTERVENTIONS: Therapeutic exercises, Therapeutic activity, Neuromuscular re-education, Balance training, Gait training, Patient/Family education, Self Care, Joint mobilization, Stair training, Vestibular training, Orthotic/Fit training, DME instructions, Manual therapy, and Re-evaluation  PLAN FOR NEXT SESSION:  N/A  Sadie Haber, PT, DPT 12/22/2022, 2:54 PM

## 2022-12-23 ENCOUNTER — Encounter: Payer: Self-pay | Admitting: Neurosurgery

## 2022-12-23 ENCOUNTER — Ambulatory Visit (INDEPENDENT_AMBULATORY_CARE_PROVIDER_SITE_OTHER): Payer: Medicare Other | Admitting: Neurosurgery

## 2022-12-23 ENCOUNTER — Telehealth: Payer: Self-pay

## 2022-12-23 VITALS — BP 146/92 | Ht 71.0 in | Wt 213.0 lb

## 2022-12-23 DIAGNOSIS — M48062 Spinal stenosis, lumbar region with neurogenic claudication: Secondary | ICD-10-CM | POA: Diagnosis not present

## 2022-12-23 DIAGNOSIS — M7138 Other bursal cyst, other site: Secondary | ICD-10-CM

## 2022-12-23 DIAGNOSIS — M21372 Foot drop, left foot: Secondary | ICD-10-CM | POA: Diagnosis not present

## 2022-12-23 DIAGNOSIS — M5416 Radiculopathy, lumbar region: Secondary | ICD-10-CM | POA: Diagnosis not present

## 2022-12-23 NOTE — Telephone Encounter (Signed)
Left message to return call to discuss surgery dates and pre-op information.

## 2022-12-23 NOTE — Telephone Encounter (Signed)
I spoke with Robert [name] at length. He would like to see his oncologist next week before deciding about surgery. He would also like to discuss surgery with his PCP. I encouraged him to call his PCP for an appointment. He does not want to choose a date at this time. He would prefer to come in to see Dr Katrinka Blazing to discuss his options again. I have scheduled him an appointment for next week. We also discussed the following information regarding surgery if he chooses to proceed.    Planned surgery: minimally invasive (MIS) lumbar laminectomy and synovial cyst resection at L4-5   Surgery date: (contact us when you are ready to schedule) at [facility]: [address], [place], [place] [address]) - you will find out your arrival time the business day before your surgery.   Pre-op appointment at [facility] Pre-admit Testing: we will call you with a date/time for this. Pre-admit testing is located on the first floor of the [facility] building, [address] [facility] [address], [address]. Please bring all prescriptions in the original prescription bottles to your appointment, even if you have reviewed medications by phone with a pharmacy representative. During this appointment, they will advise you which medications you can take the morning of surgery, and which medications you will need to hold for surgery. Pre-op labs may be done at your pre-op appointment. You are not required to fast for these labs. Should you need to change your pre-op appointment, please call Pre-admit testing at [phone].    Blood thinners:   Aspirin:  stop aspirin 7 days prior, resume aspirin 14 days after    Surgical clearance: once you choose a surgery date, we will send a clearance form to Dr Eloise Harman and Dr Myna Hidalgo    Home health physical therapy: [facility] will contact you regarding home health physical therapy for after surgery.Their number is  [phone].    Common restrictions after surgery: No bending, lifting, or twisting ("BLT"). Avoid lifting objects heavier than 10 pounds for the first 6 weeks after surgery. Where possible, avoid household activities that involve lifting, bending, reaching, pushing, or pulling such as laundry, vacuuming, grocery shopping, and childcare. Try to arrange for help from friends and family for these activities while you heal. Do not drive while taking prescription pain medication. Weeks 6 through 12 after surgery: avoid lifting more than 25 pounds.     How to contact us:  If you have any questions/concerns before or after surgery, you can reach [place] at [phone], or you can send a mychart message. We can be reached by phone or mychart 8am-4pm, Monday-Friday.  *Please note: Calls after 4pm are forwarded to a third party answering service. Mychart messages are not routinely monitored during evenings, weekends, and holidays. Please call our office to contact the answering service for urgent concerns during non-business hours.    Appointments/FMLA & disability paperwork: Patty [employer] Cristin  Nurse: Royston Cowper  Medical assistants: Laurann Montana, & Lyla Son Physician Assistants: Manning Charity & Drake Leach Surgeons: Venetia Night, MD & Ernestine Mcmurray, MD

## 2022-12-25 NOTE — Telephone Encounter (Signed)
Patient said to cancel appt with Dr.Smith to discuss surgery for right now and will call back when ready.

## 2022-12-28 ENCOUNTER — Encounter: Payer: Self-pay | Admitting: Hematology & Oncology

## 2022-12-28 ENCOUNTER — Inpatient Hospital Stay: Payer: Medicare Other

## 2022-12-28 ENCOUNTER — Other Ambulatory Visit: Payer: Self-pay

## 2022-12-28 ENCOUNTER — Inpatient Hospital Stay (HOSPITAL_BASED_OUTPATIENT_CLINIC_OR_DEPARTMENT_OTHER): Payer: Medicare Other | Admitting: Hematology & Oncology

## 2022-12-28 ENCOUNTER — Inpatient Hospital Stay: Payer: Medicare Other | Attending: Hematology & Oncology

## 2022-12-28 VITALS — BP 131/80 | HR 62 | Temp 98.0°F | Resp 18 | Ht 71.0 in | Wt 211.0 lb

## 2022-12-28 DIAGNOSIS — C9000 Multiple myeloma not having achieved remission: Secondary | ICD-10-CM

## 2022-12-28 DIAGNOSIS — D630 Anemia in neoplastic disease: Secondary | ICD-10-CM | POA: Diagnosis not present

## 2022-12-28 DIAGNOSIS — C9002 Multiple myeloma in relapse: Secondary | ICD-10-CM | POA: Diagnosis present

## 2022-12-28 DIAGNOSIS — Z5112 Encounter for antineoplastic immunotherapy: Secondary | ICD-10-CM | POA: Diagnosis present

## 2022-12-28 LAB — CBC WITH DIFFERENTIAL (CANCER CENTER ONLY)
Abs Immature Granulocytes: 0.01 10*3/uL (ref 0.00–0.07)
Basophils Absolute: 0 10*3/uL (ref 0.0–0.1)
Basophils Relative: 0 %
Eosinophils Absolute: 0.1 10*3/uL (ref 0.0–0.5)
Eosinophils Relative: 2 %
HCT: 31 % — ABNORMAL LOW (ref 39.0–52.0)
Hemoglobin: 10.3 g/dL — ABNORMAL LOW (ref 13.0–17.0)
Immature Granulocytes: 0 %
Lymphocytes Relative: 37 %
Lymphs Abs: 2.2 10*3/uL (ref 0.7–4.0)
MCH: 30.8 pg (ref 26.0–34.0)
MCHC: 33.2 g/dL (ref 30.0–36.0)
MCV: 92.8 fL (ref 80.0–100.0)
Monocytes Absolute: 0.5 10*3/uL (ref 0.1–1.0)
Monocytes Relative: 9 %
Neutro Abs: 2.9 10*3/uL (ref 1.7–7.7)
Neutrophils Relative %: 52 %
Platelet Count: 173 10*3/uL (ref 150–400)
RBC: 3.34 MIL/uL — ABNORMAL LOW (ref 4.22–5.81)
RDW: 13.4 % (ref 11.5–15.5)
WBC Count: 5.8 10*3/uL (ref 4.0–10.5)
nRBC: 0 % (ref 0.0–0.2)

## 2022-12-28 LAB — CMP (CANCER CENTER ONLY)
ALT: 10 U/L (ref 0–44)
AST: 16 U/L (ref 15–41)
Albumin: 4.1 g/dL (ref 3.5–5.0)
Alkaline Phosphatase: 42 U/L (ref 38–126)
Anion gap: 8 (ref 5–15)
BUN: 22 mg/dL (ref 8–23)
CO2: 29 mmol/L (ref 22–32)
Calcium: 9.9 mg/dL (ref 8.9–10.3)
Chloride: 100 mmol/L (ref 98–111)
Creatinine: 1.42 mg/dL — ABNORMAL HIGH (ref 0.61–1.24)
GFR, Estimated: 48 mL/min — ABNORMAL LOW (ref >60)
Glucose, Bld: 101 mg/dL — ABNORMAL HIGH (ref 70–99)
Potassium: 3.3 mmol/L — ABNORMAL LOW (ref 3.5–5.1)
Sodium: 137 mmol/L (ref 135–145)
Total Bilirubin: 0.9 mg/dL (ref 0.3–1.2)
Total Protein: 6 g/dL — ABNORMAL LOW (ref 6.5–8.1)

## 2022-12-28 LAB — LACTATE DEHYDROGENASE: LDH: 207 U/L — ABNORMAL HIGH (ref 98–192)

## 2022-12-28 MED ORDER — ACETAMINOPHEN 325 MG PO TABS
650.0000 mg | ORAL_TABLET | Freq: Once | ORAL | Status: AC
  Administered 2022-12-28: 650 mg via ORAL
  Filled 2022-12-28: qty 2

## 2022-12-28 MED ORDER — DARATUMUMAB-HYALURONIDASE-FIHJ 1800-30000 MG-UT/15ML ~~LOC~~ SOLN
1800.0000 mg | Freq: Once | SUBCUTANEOUS | Status: AC
  Administered 2022-12-28: 1800 mg via SUBCUTANEOUS
  Filled 2022-12-28: qty 15

## 2022-12-28 MED ORDER — DEXAMETHASONE 4 MG PO TABS
20.0000 mg | ORAL_TABLET | Freq: Once | ORAL | Status: AC
  Administered 2022-12-28: 20 mg via ORAL
  Filled 2022-12-28: qty 5

## 2022-12-28 MED ORDER — DIPHENHYDRAMINE HCL 25 MG PO CAPS
50.0000 mg | ORAL_CAPSULE | Freq: Once | ORAL | Status: AC
  Administered 2022-12-28: 50 mg via ORAL
  Filled 2022-12-28: qty 2

## 2022-12-28 NOTE — Progress Notes (Signed)
Hematology and Oncology Follow Up Visit  Robert [name] [id number] 02/02/1934 89 y.o. 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 is here today for follow-up and treatment.  Unfortunately, Robert [name] back is giving him a lot of trouble now.  He has a brace on Robert [name] left leg.  He has a walking cane.  He did have a MRI of the spine back in June.  This showed that he had diffuse lumbar spondylosis.  He has a lot of spinal stenosis.  There is a couple 5 mm synovial cyst.  He we will see a spine doctor.  He does not wish to have surgery.  I told him that there are other options that may be considered.  Thankfully, the MRI did not show any evidence of myeloma in the spine.  Otherwise, he is doing okay.  He has tolerated treatment quite nicely.  Robert [name] last monoclonal spike did not show any protein in the blood.  Robert [name] IgG level was 501 mg/dL.  The Kappa light chain was 1.3 mg/dL.  He has had no change in bowel or bladder habits.  He is trying to eat well.  He is trying to lose a little weight.  Overall, I would say that Robert [name] performance status is probably ECOG 1.     Medications:  Allergies as of 12/28/2022   No Known Allergies      Medication List        Accurate as of December 28, 2022  9:33 AM. If you have any questions, ask your nurse or doctor.          acetaminophen-codeine 300-30 MG tablet Commonly known as: TYLENOL #3 Take 1 tablet by mouth every 8 (eight) hours as needed for moderate pain.   aspirin 81 MG  tablet Take 81 mg by mouth daily.   benzonatate 100 MG capsule Commonly known as: TESSALON Take 1 capsule (100 mg total) by mouth 3 (three) times daily as needed for cough.   bimatoprost 0.01 % Soln Commonly known as: LUMIGAN Place 1 drop into both eyes at bedtime.   CALTRATE 600+D PLUS PO Take 1 tablet by mouth daily.   carvedilol 25 MG tablet Commonly known as: COREG Take 25 mg by mouth 2 (two) times daily.   cloNIDine 0.1 MG tablet Commonly known as: CATAPRES Take 0.1 mg by mouth 2 (two) times daily as needed (Elevated BP).   dexamethasone 4 MG tablet Commonly known as: DECADRON Take 5 tablets prior to immunotherapy treatment What changed:  how much to take how to take this when to take this   diclofenac Sodium 1 % Gel Commonly known as: VOLTAREN Apply 2 g topically 4 (four) times daily. As needed   famciclovir 250 MG tablet Commonly known as: FAMVIR TAKE 1 TABLET DAILY   Flonase Allergy Relief 50 MCG/ACT nasal spray Generic drug: fluticasone Place 1 spray into both nostrils daily.   losartan 100 MG  tablet Commonly known as: COZAAR Take 100 mg by mouth daily.   meloxicam 15 MG tablet Commonly known as: MOBIC Take 1 tablet by mouth daily as needed for pain.   montelukast 10 MG tablet Commonly known as: SINGULAIR TAKE 1 TABLET AT BEDTIME   potassium chloride SA 20 MEQ tablet Commonly known as: KLOR-CON M Take 1 tablet (20 mEq total) by mouth daily.   rosuvastatin 20 MG tablet Commonly known as: CRESTOR Take 20 mg by mouth at bedtime.   SYSTANE OP Place 1 drop into both eyes daily as needed (dry eyes).   Zoledronic Acid 4 MG/100ML IVPB Commonly known as: ZOMETA Inject 4 mg into the vein every 30 (thirty) days. 07/03/2022 Every 4 months.        Allergies: No Known Allergies  Past Medical History, Surgical history, Social history, and Family History were reviewed and updated.  Review of Systems: Review of Systems  Constitutional: Negative.    HENT: Negative.    Eyes: Negative.   Respiratory: Negative.    Cardiovascular: Negative.   Gastrointestinal: Negative.   Genitourinary: Negative.   Musculoskeletal:  Positive for joint pain.  Skin: Negative.   Neurological: Negative.   Endo/Heme/Allergies: Negative.   Psychiatric/Behavioral: Negative.       Physical Exam:  height is 5\' 11"  (1.803 m) and weight is 211 lb (95.7 kg). Robert [name] oral temperature is 98 F (36.7 C). Robert [name] blood pressure is 131/80 and Robert [name] pulse is 62. Robert [name] respiration is 18 and oxygen saturation is 100%.   Wt Readings from Last 3 Encounters:  12/28/22 211 lb (95.7 kg)  12/23/22 213 lb (96.6 kg)  11/30/22 212 lb 1.3 oz (96.2 kg)   Physical Exam Vitals reviewed.  HENT:     Head: Normocephalic and atraumatic.  Eyes:     Pupils: Pupils are equal, round, and reactive to light.  Cardiovascular:     Rate and Rhythm: Normal rate and regular rhythm.     Heart sounds: Normal heart sounds.  Pulmonary:     Effort: Pulmonary effort is normal.     Breath sounds: Normal breath sounds.  Abdominal:     General: Bowel sounds are normal.     Palpations: Abdomen is soft.  Musculoskeletal:        General: No tenderness or deformity. Normal range of motion.     Cervical back: Normal range of motion.  Lymphadenopathy:     Cervical: No cervical adenopathy.  Skin:    General: Skin is warm and dry.     Findings: No erythema or rash.  Neurological:     Mental Status: He is alert and oriented to person, place, and time.     Comments: On Robert [name] neurological exam, he does have decreased function in the left foot with dorsiflexion.  Right foot is unremarkable.  He has good pulses.  He has no tenderness to palpation over the left lower leg.  Psychiatric:        Behavior: Behavior normal.        Thought Content: Thought content normal.        Judgment: Judgment normal.      Lab Results  Component Value Date   WBC 5.8 12/28/2022   HGB 10.3 (L) 12/28/2022   HCT 31.0 (L)  12/28/2022   MCV 92.8 12/28/2022   PLT 173 12/28/2022   Lab Results  Component Value Date   FERRITIN 555 (H) 07/23/2021   IRON 84 07/23/2021   TIBC 325 07/23/2021   UIBC 241  07/23/2021   IRONPCTSAT 26 07/23/2021   Lab Results  Component Value Date   RETICCTPCT 2.2 04/05/2019   RBC 3.34 (L) 12/28/2022   Lab Results  Component Value Date   KPAFRELGTCHN 13.2 11/30/2022   LAMBDASER 8.3 11/30/2022   KAPLAMBRATIO 1.59 11/30/2022   Lab Results  Component Value Date   IGGSERUM 501 (L) 11/30/2022   IGA 22 (L) 11/30/2022   IGMSERUM 51 11/30/2022   Lab Results  Component Value Date   TOTALPROTELP 5.6 (L) 11/30/2022   ALBUMINELP 3.3 11/30/2022   A1GS 0.2 11/30/2022   A2GS 0.7 11/30/2022   BETS 0.9 11/30/2022   BETA2SER 0.3 01/03/2015   GAMS 0.5 11/30/2022   MSPIKE Not Observed 11/30/2022   SPEI Comment 11/03/2022     Chemistry      Component Value Date/Time   NA 137 12/28/2022 0840   NA 137 04/08/2017 1141   NA 138 10/29/2015 1029   K 3.3 (L) 12/28/2022 0840   K 3.4 04/08/2017 1141   K 3.9 10/29/2015 1029   CL 100 12/28/2022 0840   CL 102 04/08/2017 1141   CO2 29 12/28/2022 0840   CO2 30 04/08/2017 1141   CO2 27 10/29/2015 1029   BUN 22 12/28/2022 0840   BUN 16 04/08/2017 1141   BUN 13.8 10/29/2015 1029   CREATININE 1.42 (H) 12/28/2022 0840   CREATININE 1.3 (H) 04/08/2017 1141   CREATININE 1.1 10/29/2015 1029      Component Value Date/Time   CALCIUM 9.9 12/28/2022 0840   CALCIUM 9.3 04/08/2017 1141   CALCIUM 9.3 10/29/2015 1029   ALKPHOS 42 12/28/2022 0840   ALKPHOS 66 04/08/2017 1141   ALKPHOS 52 10/29/2015 1029   AST 16 12/28/2022 0840   AST 20 10/29/2015 1029   ALT 10 12/28/2022 0840   ALT 21 04/08/2017 1141   ALT 14 10/29/2015 1029   BILITOT 0.9 12/28/2022 0840   BILITOT 0.75 10/29/2015 1029       Impression and Plan: [name] is a very pleasant 89 yo African American gentleman with relapsed IgG kappa myeloma. He has history of stem cell  transplant in 2006 and since relapsed.    Spinal issue clearly is a problem.  This is [name] had to be taken care of.  Again, there is no evidence of myeloma which is nice.  If he needs surgery, I do not see a problem with this.  We will continue with Robert [name] daratumumab.  This is clearly keeping him in remission which is nice to see.  He is okay think that he is taking the Jakafi Robert [name] member should be so  He will get Robert [name] Zometa today.  Will plan to get him back in another month.    Josph Macho, MD 8/5/20249:33 AM

## 2022-12-28 NOTE — Patient Instructions (Signed)

## 2022-12-28 NOTE — Patient Instructions (Signed)
Daratumumab Injection What is this medication? DARATUMUMAB (dar a toom ue mab) treats multiple myeloma, a type of bone marrow cancer. It works by helping your immune system slow or stop the spread of cancer cells. It is a monoclonal antibody. This medicine may be used for other purposes; ask your health care provider or pharmacist if you have questions. COMMON BRAND NAME(S): DARZALEX What should I tell my care team before I take this medication? They need to know if you have any of these conditions: Hereditary fructose intolerance Infection, such as chickenpox, herpes, hepatitis B Lung or breathing disease, such as asthma, COPD An unusual or allergic reaction to daratumumab, sorbitol, other medications, foods, dyes, or preservatives Pregnant or trying to get pregnant Breastfeeding How should I use this medication? This medication is injected into a vein. It is given by your care team in a hospital or clinic setting. Talk to your care team about the use of this medication in children. Special care may be needed. Overdosage: If you think you have taken too much of this medicine contact a poison control center or emergency room at once. NOTE: This medicine is only for you. Do not share this medicine with others. What if I miss a dose? Keep appointments for follow-up doses. It is important not to miss your dose. Call your care team if you are unable to keep an appointment. What may interact with this medication? Interactions have not been studied. This list may not describe all possible interactions. Give your health care provider a list of all the medicines, herbs, non-prescription drugs, or dietary supplements you use. Also tell them if you smoke, drink alcohol, or use illegal drugs. Some items may interact with your medicine. What should I watch for while using this medication? Your condition will be monitored carefully while you are receiving this medication. This medication can cause  serious allergic reactions. To reduce your risk, your care team may give you other medication to take before receiving this one. Be sure to follow the directions from your care team. This medication can affect the results of blood tests to match your blood type. These changes can last for up to 6 months after the final dose. Your care team will do blood tests to match your blood type before you start treatment. Tell all of your care team that you are being treated with this medication before receiving a blood transfusion. This medication can affect the results of some tests used to determine treatment response; extra tests may be needed to evaluate response. Talk to your care team if you wish to become pregnant or think you are pregnant. This medication can cause serious birth defects if taken during pregnancy and for 3 months after the last dose. A reliable form of contraception is recommended while taking this medication and for 3 months after the last dose. Talk to your care team about effective forms of contraception. Do not breast-feed while taking this medication. What side effects may I notice from receiving this medication? Side effects that you should report to your care team as soon as possible: Allergic reactions--skin rash, itching, hives, swelling of the face, lips, tongue, or throat Infection--fever, chills, cough, sore throat, wounds that don't heal, pain or trouble when passing urine, general feeling of discomfort or being unwell Infusion reactions--chest pain, shortness of breath or trouble breathing, feeling faint or lightheaded Unusual bruising or bleeding Side effects that usually do not require medical attention (report to your care team if they continue or   are bothersome): Constipation Diarrhea Fatigue Nausea Pain, tingling, or numbness in the hands or feet Swelling of the ankles, hands, or feet This list may not describe all possible side effects. Call your doctor for medical  advice about side effects. You may report side effects to FDA at 1-800-FDA-1088. Where should I keep my medication? This medication is given in a hospital or clinic. It will not be stored at home. NOTE: This sheet is a summary. It may not cover all possible information. If you have questions about this medicine, talk to your doctor, pharmacist, or health care provider.  2024 Elsevier/Gold Standard (2022-03-19 00:00:00)  

## 2022-12-29 ENCOUNTER — Ambulatory Visit: Payer: Medicare Other | Admitting: Neurosurgery

## 2022-12-30 ENCOUNTER — Other Ambulatory Visit: Payer: Self-pay

## 2023-01-04 ENCOUNTER — Other Ambulatory Visit: Payer: Self-pay

## 2023-01-14 ENCOUNTER — Encounter: Payer: Self-pay | Admitting: Orthopedic Surgery

## 2023-01-14 ENCOUNTER — Other Ambulatory Visit (INDEPENDENT_AMBULATORY_CARE_PROVIDER_SITE_OTHER): Payer: Medicare Other

## 2023-01-14 ENCOUNTER — Ambulatory Visit (INDEPENDENT_AMBULATORY_CARE_PROVIDER_SITE_OTHER): Payer: Medicare Other | Admitting: Orthopedic Surgery

## 2023-01-14 VITALS — BP 137/77 | HR 62 | Ht 71.0 in | Wt 213.0 lb

## 2023-01-14 DIAGNOSIS — M545 Low back pain, unspecified: Secondary | ICD-10-CM

## 2023-01-14 NOTE — Progress Notes (Signed)
Orthopedic Spine Surgery Office Note  Assessment: Patient is a 89 y.o. 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 I also talked about decompression at T12/L1 in the area of spinal stenosis at the cord level for his imbalance. He said his bigger issue is the foot drop. Given that he has had this for over 3 months and he is a 0/5 on exam, I told him that even with surgical decompression I would not expect him to regain full strength and he may not even regain antigravity strength.After this conversation, he did not want to pursue any treatment for it besides continuing to use his AFO. In regards to his him imbalance, he has no hyperreflexia or clonus and states that his imbalance is mild. He said he does not even need to use a cane so he wanted to monitor that for now.  -Patient should return to office in 2 months, x-rays at next visit: none   Patient expressed understanding of the plan and all questions were answered to the patient's satisfaction.   ___________________________________________________________________________   History:  Patient is a 89 y.o. male who presents today for lumbar spine. Patient comes in today with imbalance and left foot drop. He states that he has had the foot drop for over 3 months now. He has been using an AFO. He uses a cane at times for balance but says that he does not need it. He has mild back pain that responds to a 500mg  tablet of tylenol once a day when he gets it. He does not have any pain, heaviness, cramping, or tiredness in his legs. The foot drop developed painlessly. He also reports worsening balance. He has not needed any ambulatory assistive devices but he will often bring a cane because it gives him reassurance and makes him feel more secure. He has not noticed any difficulty with  fine motor skills with the hands with tasks like buttoning shirts.    Weakness: yes, has noticed a left foot drop. No other weakness noted.  Symptoms of imbalance: yes, feels more unsteady on his feet Paresthesias and numbness: yes, in his index finger on the right hand. No other numbness or paresthesias Bowel or bladder incontinence: denies Saddle anesthesia: denies  Treatments tried: AFO, cane use, PT  Review of systems: Denies fevers and chills, night sweats, unexplained weight loss, history of cancer, pain that wakes them at night  Past medical history: Multiple myeloma CKD with associated anemia Glaucoma B12 deficiency HLD HTN Iron deficiency  Allergies: NKDA  Past surgical history:  Cataract surgery Stem cell transplant  Social history: Former use of nicotine product (smoking, vaping, patches, smokeless) Alcohol use: Yes, approximately 3 drinks per week Denies recreational drug use   Physical Exam:  BMI of 29.7  General: no acute distress, appears stated age Neurologic: alert, answering questions appropriately, following commands Respiratory: unlabored breathing on room air, symmetric chest rise Psychiatric: appropriate affect, normal cadence to speech   MSK (spine):  -Strength exam      Left  Right EHL    1/5  1/5 TA    0/5  4/5 GSC    5/5  5/5 Knee extension  5/5  5/5 Hip flexion   5/5  5/5  -Sensory exam    Sensation intact to light touch in L3-S1 nerve distributions of bilateral lower extremities  -  Achilles DTR: 1/4 on the left, 1/4 on the right -Patellar tendon DTR: 1/4 on the left, 1/4 on the right  -Straight leg raise: negative bilaterally -Femoral nerve stretch test: negative bilaterally  -Clonus: no beats bilaterally -Negative hoffman bilaterally -Positive romberg  -Left hip exam: no pain through range of motion -Right hip exam: no pain through range of motion  Imaging: XR of the lumbar spine from 01/14/2023 was independently  reviewed and interpreted, showing lumbar 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 Disc height loss seen at all lumbar levels. Anterior osteophyte formation noted at L2/3. No fracture or dislocation seen. No evidence of instability on flexion/extension views.   MRI of the lumbar spine from 11/13/2022 was independently reviewed and interpreted, showing small synovial cyst on the left at L1/2. There is central stenosis at that level as well. Central stenosis L3/4. Central stenosis and lateral recess stenosis at L4/5 with a  synovial cyst on the right at L4/5. Foraminal stenosis on the left at L2/3. Disc herniation with central stenosis at T12/L1.    Patient name: Robert [name] Patient MRN: [id number] Date of visit: 01/14/23

## 2023-01-26 ENCOUNTER — Inpatient Hospital Stay: Payer: Medicare Other | Attending: Hematology & Oncology

## 2023-01-26 ENCOUNTER — Inpatient Hospital Stay: Payer: Medicare Other

## 2023-01-26 ENCOUNTER — Inpatient Hospital Stay (HOSPITAL_BASED_OUTPATIENT_CLINIC_OR_DEPARTMENT_OTHER): Payer: Medicare Other | Admitting: Hematology & Oncology

## 2023-01-26 ENCOUNTER — Encounter: Payer: Self-pay | Admitting: Hematology & Oncology

## 2023-01-26 DIAGNOSIS — C9000 Multiple myeloma not having achieved remission: Secondary | ICD-10-CM

## 2023-01-26 DIAGNOSIS — D630 Anemia in neoplastic disease: Secondary | ICD-10-CM | POA: Diagnosis not present

## 2023-01-26 DIAGNOSIS — C9002 Multiple myeloma in relapse: Secondary | ICD-10-CM | POA: Insufficient documentation

## 2023-01-26 DIAGNOSIS — Z9484 Stem cells transplant status: Secondary | ICD-10-CM | POA: Insufficient documentation

## 2023-01-26 DIAGNOSIS — Z5112 Encounter for antineoplastic immunotherapy: Secondary | ICD-10-CM | POA: Insufficient documentation

## 2023-01-26 LAB — CBC WITH DIFFERENTIAL (CANCER CENTER ONLY)
Abs Immature Granulocytes: 0.02 10*3/uL (ref 0.00–0.07)
Basophils Absolute: 0 10*3/uL (ref 0.0–0.1)
Basophils Relative: 1 %
Eosinophils Absolute: 0.2 10*3/uL (ref 0.0–0.5)
Eosinophils Relative: 3 %
HCT: 32.1 % — ABNORMAL LOW (ref 39.0–52.0)
Hemoglobin: 10.6 g/dL — ABNORMAL LOW (ref 13.0–17.0)
Immature Granulocytes: 0 %
Lymphocytes Relative: 40 %
Lymphs Abs: 2.2 10*3/uL (ref 0.7–4.0)
MCH: 31.3 pg (ref 26.0–34.0)
MCHC: 33 g/dL (ref 30.0–36.0)
MCV: 94.7 fL (ref 80.0–100.0)
Monocytes Absolute: 0.5 10*3/uL (ref 0.1–1.0)
Monocytes Relative: 9 %
Neutro Abs: 2.6 10*3/uL (ref 1.7–7.7)
Neutrophils Relative %: 47 %
Platelet Count: 174 10*3/uL (ref 150–400)
RBC: 3.39 MIL/uL — ABNORMAL LOW (ref 4.22–5.81)
RDW: 13.3 % (ref 11.5–15.5)
WBC Count: 5.4 10*3/uL (ref 4.0–10.5)
nRBC: 0 % (ref 0.0–0.2)

## 2023-01-26 LAB — CMP (CANCER CENTER ONLY)
ALT: 10 U/L (ref 0–44)
AST: 14 U/L — ABNORMAL LOW (ref 15–41)
Albumin: 4 g/dL (ref 3.5–5.0)
Alkaline Phosphatase: 39 U/L (ref 38–126)
Anion gap: 6 (ref 5–15)
BUN: 17 mg/dL (ref 8–23)
CO2: 30 mmol/L (ref 22–32)
Calcium: 9.9 mg/dL (ref 8.9–10.3)
Chloride: 103 mmol/L (ref 98–111)
Creatinine: 1.26 mg/dL — ABNORMAL HIGH (ref 0.61–1.24)
GFR, Estimated: 55 mL/min — ABNORMAL LOW (ref >60)
Glucose, Bld: 102 mg/dL — ABNORMAL HIGH (ref 70–99)
Potassium: 3.3 mmol/L — ABNORMAL LOW (ref 3.5–5.1)
Sodium: 139 mmol/L (ref 135–145)
Total Bilirubin: 0.7 mg/dL (ref 0.3–1.2)
Total Protein: 6 g/dL — ABNORMAL LOW (ref 6.5–8.1)

## 2023-01-26 LAB — LACTATE DEHYDROGENASE: LDH: 190 U/L (ref 98–192)

## 2023-01-26 MED ORDER — HEPARIN SOD (PORK) LOCK FLUSH 100 UNIT/ML IV SOLN
500.0000 [IU] | Freq: Once | INTRAVENOUS | Status: AC
  Administered 2023-01-26: 500 [IU] via INTRAVENOUS

## 2023-01-26 MED ORDER — DARATUMUMAB-HYALURONIDASE-FIHJ 1800-30000 MG-UT/15ML ~~LOC~~ SOLN
1800.0000 mg | Freq: Once | SUBCUTANEOUS | Status: AC
  Administered 2023-01-26: 1800 mg via SUBCUTANEOUS
  Filled 2023-01-26: qty 15

## 2023-01-26 MED ORDER — SODIUM CHLORIDE 0.9% FLUSH
10.0000 mL | INTRAVENOUS | Status: DC | PRN
  Administered 2023-01-26: 10 mL via INTRAVENOUS

## 2023-01-26 MED ORDER — ACETAMINOPHEN 325 MG PO TABS
650.0000 mg | ORAL_TABLET | Freq: Once | ORAL | Status: AC
  Administered 2023-01-26: 650 mg via ORAL
  Filled 2023-01-26: qty 2

## 2023-01-26 MED ORDER — DEXAMETHASONE 4 MG PO TABS
20.0000 mg | ORAL_TABLET | Freq: Once | ORAL | Status: AC
  Administered 2023-01-26: 20 mg via ORAL
  Filled 2023-01-26: qty 5

## 2023-01-26 MED ORDER — DIPHENHYDRAMINE HCL 25 MG PO CAPS
50.0000 mg | ORAL_CAPSULE | Freq: Once | ORAL | Status: AC
  Administered 2023-01-26: 50 mg via ORAL
  Filled 2023-01-26: qty 2

## 2023-01-26 NOTE — Progress Notes (Signed)
Hematology and Oncology Follow Up Visit  Robert [name] [id number] 05-04-1934 89 y.o. 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  He has some problems with the left leg.  He says he will not see Spinal Surgery for another 2 months.  He is getting around okay.  He has had no falling.  He does use a cane.  He has had no problems with pain in the left leg.   He has had no cough or shortness of breath.  He has had no change in bowel or bladder habits.  He has had no nausea or vomiting.    His last monoclonal studies did not show monoclonal spike in his blood.  His IgG level was 514 mg/dL.  The Kappa light chain was 1.2 mg/dL.  Thankfully, his wife is doing okay.  His appetite is all right.  Try to watch what he eats.  His blood pressure has been on the higher side.  Overall, I would say that his performance status is probably ECOG 1.      Medications:  Allergies as of 01/26/2023   No Known Allergies      Medication List        Accurate as of January 26, 2023  9:46 AM. If you have any questions, ask your nurse or doctor.          acetaminophen 650 MG CR tablet Commonly known as: TYLENOL Take 650 mg by mouth every 8 (eight) hours as needed for pain.   acetaminophen-codeine 300-30 MG tablet Commonly known as: TYLENOL #3 Take 1 tablet by mouth every 8 (eight) hours as needed for moderate pain.   aspirin 81 MG tablet Take 81 mg by mouth daily.    benzonatate 100 MG capsule Commonly known as: TESSALON Take 1 capsule (100 mg total) by mouth 3 (three) times daily as needed for cough.   bimatoprost 0.01 % Soln Commonly known as: LUMIGAN Place 1 drop into both eyes at bedtime.   CALTRATE 600+D PLUS PO Take 1 tablet by mouth daily.   carvedilol 25 MG tablet Commonly known as: COREG Take 25 mg by mouth 2 (two) times daily.   cloNIDine 0.1 MG tablet Commonly known as: CATAPRES Take 0.1 mg by mouth 2 (two) times daily as needed (Elevated BP).   dexamethasone 4 MG tablet Commonly known as: DECADRON Take 5 tablets prior to immunotherapy treatment What changed:  how much to take how to take this when to take this   diclofenac Sodium 1 % Gel Commonly known as: VOLTAREN Apply 2 g topically 4 (four) times daily. As needed   famciclovir 250 MG tablet Commonly known as: FAMVIR TAKE 1 TABLET DAILY   Flonase Allergy Relief 50 MCG/ACT nasal spray Generic drug: fluticasone Place 1 spray into both nostrils daily.   losartan 100 MG tablet Commonly known as: COZAAR Take 100 mg by  mouth daily.   meloxicam 15 MG tablet Commonly known as: MOBIC Take 1 tablet by mouth daily as needed for pain.   montelukast 10 MG tablet Commonly known as: SINGULAIR TAKE 1 TABLET AT BEDTIME   potassium chloride SA 20 MEQ tablet Commonly known as: KLOR-CON M Take 1 tablet (20 mEq total) by mouth daily.   rosuvastatin 20 MG tablet Commonly known as: CRESTOR Take 20 mg by mouth at bedtime.   SYSTANE OP Place 1 drop into both eyes daily as needed (dry eyes).   Zoledronic Acid 4 MG/100ML IVPB Commonly known as: ZOMETA Inject 4 mg into the vein every 30 (thirty) days. 07/03/2022 Every 4 months.        Allergies: No Known Allergies  Past Medical History, Surgical history, Social history, and Family History were reviewed and updated.  Review of Systems: Review of Systems  Constitutional: Negative.   HENT: Negative.    Eyes: Negative.    Respiratory: Negative.    Cardiovascular: Negative.   Gastrointestinal: Negative.   Genitourinary: Negative.   Musculoskeletal:  Positive for joint pain.  Skin: Negative.   Neurological: Negative.   Endo/Heme/Allergies: Negative.   Psychiatric/Behavioral: Negative.       Physical Exam:  height is 5\' 11"  (1.803 m) and weight is 210 lb 6.4 oz (95.4 kg). His oral temperature is 97.8 F (36.6 C). His blood pressure is 142/73 (abnormal) and his pulse is 53 (abnormal). His respiration is 20 and oxygen saturation is 100%.   Wt Readings from Last 3 Encounters:  01/26/23 210 lb 6.4 oz (95.4 kg)  01/14/23 213 lb (96.6 kg)  12/28/22 211 lb (95.7 kg)   Physical Exam Vitals reviewed.  HENT:     Head: Normocephalic and atraumatic.  Eyes:     Pupils: Pupils are equal, round, and reactive to light.  Cardiovascular:     Rate and Rhythm: Normal rate and regular rhythm.     Heart sounds: Normal heart sounds.  Pulmonary:     Effort: Pulmonary effort is normal.     Breath sounds: Normal breath sounds.  Abdominal:     General: Bowel sounds are normal.     Palpations: Abdomen is soft.  Musculoskeletal:        General: No tenderness or deformity. Normal range of motion.     Cervical back: Normal range of motion.  Lymphadenopathy:     Cervical: No cervical adenopathy.  Skin:    General: Skin is warm and dry.     Findings: No erythema or rash.  Neurological:     Mental Status: He is alert and oriented to person, place, and time.     Comments: On his neurological exam, he does have decreased function in the left foot with dorsiflexion.  Right foot is unremarkable.  He has good pulses.  He has no tenderness to palpation over the left lower leg.  Psychiatric:        Behavior: Behavior normal.        Thought Content: Thought content normal.        Judgment: Judgment normal.      Lab Results  Component Value Date   WBC 5.4 01/26/2023   HGB 10.6 (L) 01/26/2023   HCT 32.1 (L) 01/26/2023    MCV 94.7 01/26/2023   PLT 174 01/26/2023   Lab Results  Component Value Date   FERRITIN 555 (H) 07/23/2021   IRON 84 07/23/2021   TIBC 325 07/23/2021   UIBC 241 07/23/2021   IRONPCTSAT 26  07/23/2021   Lab Results  Component Value Date   RETICCTPCT 2.2 04/05/2019   RBC 3.39 (L) 01/26/2023   Lab Results  Component Value Date   KPAFRELGTCHN 12.4 12/28/2022   LAMBDASER 7.0 12/28/2022   KAPLAMBRATIO 1.77 (H) 12/28/2022   Lab Results  Component Value Date   IGGSERUM 514 (L) 12/28/2022   IGA 23 (L) 12/28/2022   IGMSERUM 56 12/28/2022   Lab Results  Component Value Date   TOTALPROTELP 5.6 (L) 12/28/2022   ALBUMINELP 3.3 11/30/2022   A1GS 0.2 11/30/2022   A2GS 0.7 11/30/2022   BETS 0.9 11/30/2022   BETA2SER 0.3 01/03/2015   GAMS 0.5 11/30/2022   MSPIKE Not Observed 11/30/2022   SPEI Comment 11/03/2022     Chemistry      Component Value Date/Time   NA 139 01/26/2023 0835   NA 137 04/08/2017 1141   NA 138 10/29/2015 1029   K 3.3 (L) 01/26/2023 0835   K 3.4 04/08/2017 1141   K 3.9 10/29/2015 1029   CL 103 01/26/2023 0835   CL 102 04/08/2017 1141   CO2 30 01/26/2023 0835   CO2 30 04/08/2017 1141   CO2 27 10/29/2015 1029   BUN 17 01/26/2023 0835   BUN 16 04/08/2017 1141   BUN 13.8 10/29/2015 1029   CREATININE 1.26 (H) 01/26/2023 0835   CREATININE 1.3 (H) 04/08/2017 1141   CREATININE 1.1 10/29/2015 1029      Component Value Date/Time   CALCIUM 9.9 01/26/2023 0835   CALCIUM 9.3 04/08/2017 1141   CALCIUM 9.3 10/29/2015 1029   ALKPHOS 39 01/26/2023 0835   ALKPHOS 66 04/08/2017 1141   ALKPHOS 52 10/29/2015 1029   AST 14 (L) 01/26/2023 0835   AST 20 10/29/2015 1029   ALT 10 01/26/2023 0835   ALT 21 04/08/2017 1141   ALT 14 10/29/2015 1029   BILITOT 0.7 01/26/2023 0835   BILITOT 0.75 10/29/2015 1029       Impression and Plan: Mr. Basara is a very pleasant 89 yo African American gentleman with relapsed IgG kappa myeloma. He has history of stem cell transplant  in 2006 and since relapsed.    Hopefully, this spinal issue can be resolved.  I know he does not want to have surgery.  Maybe, an epidural we will be able to help.  We will go ahead and continue with the daratumumab.  His spine issue is nothing to do with his myeloma.  I will plan to see him back in another month.    Josph Macho, MD 9/3/20249:46 AM

## 2023-01-26 NOTE — Progress Notes (Signed)
BP 142/73 on second reading. Patient takes BP at home and it is WNR.  Encouraged to continue to take meds as prescribed and monitor BP. Verbalized understanding.

## 2023-01-26 NOTE — Patient Instructions (Signed)
[facility]  Discharge Instructions: Thank you for choosing [facility] to provide your oncology and hematology care.   If you have a lab appointment with the Cancer Center, please go directly to the Cancer Center and check in at the registration area.  Wear comfortable clothing and clothing appropriate for easy access to any Portacath or PICC line.   We strive to give you quality time with your provider. You may need to reschedule your appointment if you arrive late (15 or more minutes).  Arriving late affects you and other patients whose appointments are after yours.  Also, if you miss three or more appointments without notifying the office, you may be dismissed from the clinic at the provider's discretion.      For prescription refill requests, have your pharmacy contact our office and allow 72 hours for refills to be completed.    Today you received the following chemotherapy and/or immunotherapy agents:  Darzalex Faspro      To help prevent nausea and vomiting after your treatment, we encourage you to take your nausea medication as directed.  BELOW ARE SYMPTOMS THAT SHOULD BE REPORTED IMMEDIATELY: *FEVER GREATER THAN 100.4 F (38 C) OR HIGHER *CHILLS OR SWEATING *NAUSEA AND VOMITING THAT IS NOT CONTROLLED WITH YOUR NAUSEA MEDICATION *UNUSUAL SHORTNESS OF BREATH *UNUSUAL BRUISING OR BLEEDING *URINARY PROBLEMS (pain or burning when urinating, or frequent urination) *BOWEL PROBLEMS (unusual diarrhea, constipation, pain near the anus) TENDERNESS IN MOUTH AND THROAT WITH OR WITHOUT PRESENCE OF ULCERS (sore throat, sores in mouth, or a toothache) UNUSUAL RASH, SWELLING OR PAIN  UNUSUAL VAGINAL DISCHARGE OR ITCHING   Items with * indicate a potential emergency and should be followed up as soon as possible or go to the Emergency Department if any problems should occur.  Please show the CHEMOTHERAPY ALERT CARD or IMMUNOTHERAPY ALERT CARD at  check-in to the Emergency Department and triage nurse. Should you have questions after your visit or need to cancel or reschedule your appointment, please contact [facility] [place]  [phone] and follow the prompts.  Office hours are 8:00 a.m. to 4:30 p.m. Monday - Friday. Please note that voicemails left after 4:00 p.m. may not be returned until the following business day.  We are closed weekends and major holidays. You have access to a nurse at all times for urgent questions. Please call the main number to the clinic [phone] and follow the prompts.  For any non-urgent questions, you may also contact your provider using MyChart. We now offer e-Visits for anyone 64 and older to request care online for non-urgent symptoms. For details visit [email or web address].   Also download the MyChart app! Go to the app store, search "MyChart", open the app, select [facility], and log in with your MyChart username and password.

## 2023-01-27 LAB — IGG, IGA, IGM
IgA: 20 mg/dL — ABNORMAL LOW (ref 61–437)
IgG (Immunoglobin G), Serum: 548 mg/dL — ABNORMAL LOW (ref 603–1613)
IgM (Immunoglobulin M), Srm: 52 mg/dL (ref 15–143)

## 2023-01-27 LAB — PROTEIN ELECTROPHORESIS, SERUM, WITH REFLEX
A/G Ratio: 1.6 (ref 0.7–1.7)
Albumin ELP: 3.5 g/dL (ref 2.9–4.4)
Alpha-1-Globulin: 0.2 g/dL (ref 0.0–0.4)
Alpha-2-Globulin: 0.7 g/dL (ref 0.4–1.0)
Beta Globulin: 0.8 g/dL (ref 0.7–1.3)
Gamma Globulin: 0.4 g/dL (ref 0.4–1.8)
Globulin, Total: 2.2 g/dL (ref 2.2–3.9)
Total Protein ELP: 5.7 g/dL — ABNORMAL LOW (ref 6.0–8.5)

## 2023-01-27 LAB — KAPPA/LAMBDA LIGHT CHAINS
Kappa free light chain: 12.3 mg/L (ref 3.3–19.4)
Kappa, lambda light chain ratio: 1.76 — ABNORMAL HIGH (ref 0.26–1.65)
Lambda free light chains: 7 mg/L (ref 5.7–26.3)

## 2023-02-05 ENCOUNTER — Ambulatory Visit: Payer: Medicare Other | Admitting: Neurology

## 2023-02-22 ENCOUNTER — Inpatient Hospital Stay: Payer: Medicare Other

## 2023-02-22 ENCOUNTER — Encounter: Payer: Self-pay | Admitting: Hematology & Oncology

## 2023-02-22 ENCOUNTER — Inpatient Hospital Stay (HOSPITAL_BASED_OUTPATIENT_CLINIC_OR_DEPARTMENT_OTHER): Payer: Medicare Other | Admitting: Hematology & Oncology

## 2023-02-22 VITALS — BP 145/72 | HR 52 | Resp 17

## 2023-02-22 VITALS — BP 149/67 | HR 59 | Temp 97.7°F | Resp 17 | Ht 71.0 in | Wt 206.0 lb

## 2023-02-22 DIAGNOSIS — Z5112 Encounter for antineoplastic immunotherapy: Secondary | ICD-10-CM | POA: Diagnosis not present

## 2023-02-22 DIAGNOSIS — C9000 Multiple myeloma not having achieved remission: Secondary | ICD-10-CM

## 2023-02-22 DIAGNOSIS — M899 Disorder of bone, unspecified: Secondary | ICD-10-CM

## 2023-02-22 DIAGNOSIS — D631 Anemia in chronic kidney disease: Secondary | ICD-10-CM

## 2023-02-22 LAB — CMP (CANCER CENTER ONLY)
ALT: 14 U/L (ref 0–44)
AST: 16 U/L (ref 15–41)
Albumin: 3.8 g/dL (ref 3.5–5.0)
Alkaline Phosphatase: 37 U/L — ABNORMAL LOW (ref 38–126)
Anion gap: 9 (ref 5–15)
BUN: 25 mg/dL — ABNORMAL HIGH (ref 8–23)
CO2: 28 mmol/L (ref 22–32)
Calcium: 9.4 mg/dL (ref 8.9–10.3)
Chloride: 102 mmol/L (ref 98–111)
Creatinine: 1.62 mg/dL — ABNORMAL HIGH (ref 0.61–1.24)
GFR, Estimated: 41 mL/min — ABNORMAL LOW (ref >60)
Glucose, Bld: 96 mg/dL (ref 70–99)
Potassium: 3.4 mmol/L — ABNORMAL LOW (ref 3.5–5.1)
Sodium: 139 mmol/L (ref 135–145)
Total Bilirubin: 0.6 mg/dL (ref 0.3–1.2)
Total Protein: 6.1 g/dL — ABNORMAL LOW (ref 6.5–8.1)

## 2023-02-22 LAB — CBC WITH DIFFERENTIAL (CANCER CENTER ONLY)
Abs Immature Granulocytes: 0.04 10*3/uL (ref 0.00–0.07)
Basophils Absolute: 0 10*3/uL (ref 0.0–0.1)
Basophils Relative: 1 %
Eosinophils Absolute: 0.1 10*3/uL (ref 0.0–0.5)
Eosinophils Relative: 2 %
HCT: 31.1 % — ABNORMAL LOW (ref 39.0–52.0)
Hemoglobin: 10.3 g/dL — ABNORMAL LOW (ref 13.0–17.0)
Immature Granulocytes: 1 %
Lymphocytes Relative: 46 %
Lymphs Abs: 2.4 10*3/uL (ref 0.7–4.0)
MCH: 31.2 pg (ref 26.0–34.0)
MCHC: 33.1 g/dL (ref 30.0–36.0)
MCV: 94.2 fL (ref 80.0–100.0)
Monocytes Absolute: 0.5 10*3/uL (ref 0.1–1.0)
Monocytes Relative: 9 %
Neutro Abs: 2.1 10*3/uL (ref 1.7–7.7)
Neutrophils Relative %: 41 %
Platelet Count: 203 10*3/uL (ref 150–400)
RBC: 3.3 MIL/uL — ABNORMAL LOW (ref 4.22–5.81)
RDW: 13.2 % (ref 11.5–15.5)
WBC Count: 5.2 10*3/uL (ref 4.0–10.5)
nRBC: 0 % (ref 0.0–0.2)

## 2023-02-22 MED ORDER — HEPARIN SOD (PORK) LOCK FLUSH 100 UNIT/ML IV SOLN
500.0000 [IU] | Freq: Once | INTRAVENOUS | Status: AC | PRN
  Administered 2023-02-22: 500 [IU]

## 2023-02-22 MED ORDER — SODIUM CHLORIDE 0.9% FLUSH
10.0000 mL | INTRAVENOUS | Status: DC | PRN
  Administered 2023-02-22: 10 mL

## 2023-02-22 MED ORDER — ACETAMINOPHEN 325 MG PO TABS
650.0000 mg | ORAL_TABLET | Freq: Once | ORAL | Status: AC
  Administered 2023-02-22: 650 mg via ORAL
  Filled 2023-02-22: qty 2

## 2023-02-22 MED ORDER — DARATUMUMAB-HYALURONIDASE-FIHJ 1800-30000 MG-UT/15ML ~~LOC~~ SOLN
1800.0000 mg | Freq: Once | SUBCUTANEOUS | Status: AC
  Administered 2023-02-22: 1800 mg via SUBCUTANEOUS
  Filled 2023-02-22: qty 15

## 2023-02-22 MED ORDER — DEXAMETHASONE 4 MG PO TABS
20.0000 mg | ORAL_TABLET | Freq: Once | ORAL | Status: AC
  Administered 2023-02-22: 20 mg via ORAL
  Filled 2023-02-22: qty 5

## 2023-02-22 MED ORDER — DIPHENHYDRAMINE HCL 25 MG PO CAPS
50.0000 mg | ORAL_CAPSULE | Freq: Once | ORAL | Status: AC
  Administered 2023-02-22: 50 mg via ORAL
  Filled 2023-02-22: qty 2

## 2023-02-22 NOTE — Progress Notes (Signed)
Hematology and Oncology Follow Up Visit  Robert [name] [id number] 08-08-1933 89 y.o. 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  He has still dealing with the left leg.  He has had 2 or 3 different opinions.  He does have a bulging disc at L5-S1.  He does not want to have surgery.  Maybe, some physical therapy will help.  Otherwise, he is doing pretty well.  He manage to get through the Her cane.  Thankfully, his hometown is [name] so they really do not have much in the way of rain or wind.  He has had no problems with cough or shortness of breath.  He has had no bleeding.  There has been no change in bowel or bladder habits.  He has had no rashes.  He has had no fever.  His last monoclonal studies did not show any monoclonal spike in his blood.  His IgG level was 148 mg/dL.  The Kappa light chain was 1.2 mg/dL.  His appetite has been quite good.  He has had no nausea or vomiting.  Overall, I would say performance status is prior ECOG 1.      Medications:  Allergies as of 02/22/2023   No Known Allergies      Medication List        Accurate as of February 22, 2023  9:06 AM. If you have any questions, ask your nurse or doctor.          acetaminophen 650 MG CR tablet Commonly known as: TYLENOL Take 650 mg by mouth every 8 (eight) hours as needed for pain.   acetaminophen-codeine 300-30 MG tablet Commonly known as: TYLENOL  #3 Take 1 tablet by mouth every 8 (eight) hours as needed for moderate pain.   aspirin 81 MG tablet Take 81 mg by mouth daily.   benzonatate 100 MG capsule Commonly known as: TESSALON Take 1 capsule (100 mg total) by mouth 3 (three) times daily as needed for cough.   bimatoprost 0.01 % Soln Commonly known as: LUMIGAN Place 1 drop into both eyes at bedtime.   CALTRATE 600+D PLUS PO Take 1 tablet by mouth daily.   carvedilol 25 MG tablet Commonly known as: COREG Take 25 mg by mouth 2 (two) times daily.   cloNIDine 0.1 MG tablet Commonly known as: CATAPRES Take 0.1 mg by mouth 2 (two) times daily as needed (Elevated BP).   dexamethasone 4 MG tablet Commonly known as: DECADRON Take 5 tablets prior to immunotherapy treatment What changed:  how much to take how to take this when to take this   diclofenac Sodium 1 % Gel Commonly known as: VOLTAREN Apply 2 g topically 4 (four) times daily. As needed   famciclovir 250 MG tablet Commonly known as: FAMVIR TAKE 1 TABLET DAILY   Flonase Allergy Relief 50 MCG/ACT nasal spray Generic drug: fluticasone  Place 1 spray into both nostrils daily.   losartan 100 MG tablet Commonly known as: COZAAR Take 100 mg by mouth daily.   meloxicam 15 MG tablet Commonly known as: MOBIC Take 1 tablet by mouth daily as needed for pain.   montelukast 10 MG tablet Commonly known as: SINGULAIR TAKE 1 TABLET AT BEDTIME   potassium chloride SA 20 MEQ tablet Commonly known as: KLOR-CON M Take 1 tablet (20 mEq total) by mouth daily.   rosuvastatin 20 MG tablet Commonly known as: CRESTOR Take 20 mg by mouth at bedtime.   SYSTANE OP Place 1 drop into both eyes daily as needed (dry eyes).   Zoledronic Acid 4 MG/100ML IVPB Commonly known as: ZOMETA Inject 4 mg into the vein every 30 (thirty) days. 07/03/2022 Every 4 months.        Allergies: No Known Allergies  Past Medical History, Surgical history, Social history, and Family History  were reviewed and updated.  Review of Systems: Review of Systems  Constitutional: Negative.   HENT: Negative.    Eyes: Negative.   Respiratory: Negative.    Cardiovascular: Negative.   Gastrointestinal: Negative.   Genitourinary: Negative.   Musculoskeletal:  Positive for joint pain.  Skin: Negative.   Neurological: Negative.   Endo/Heme/Allergies: Negative.   Psychiatric/Behavioral: Negative.       Physical Exam:  height is 5\' 11"  (1.803 m) and weight is 206 lb (93.4 kg). His oral temperature is 97.7 F (36.5 C). His blood pressure is 149/67 (abnormal) and his pulse is 59 (abnormal). His respiration is 17 and oxygen saturation is 100%.   Wt Readings from Last 3 Encounters:  02/22/23 206 lb (93.4 kg)  01/26/23 210 lb 6.4 oz (95.4 kg)  01/14/23 213 lb (96.6 kg)   Physical Exam Vitals reviewed.  HENT:     Head: Normocephalic and atraumatic.  Eyes:     Pupils: Pupils are equal, round, and reactive to light.  Cardiovascular:     Rate and Rhythm: Normal rate and regular rhythm.     Heart sounds: Normal heart sounds.  Pulmonary:     Effort: Pulmonary effort is normal.     Breath sounds: Normal breath sounds.  Abdominal:     General: Bowel sounds are normal.     Palpations: Abdomen is soft.  Musculoskeletal:        General: No tenderness or deformity. Normal range of motion.     Cervical back: Normal range of motion.  Lymphadenopathy:     Cervical: No cervical adenopathy.  Skin:    General: Skin is warm and dry.     Findings: No erythema or rash.  Neurological:     Mental Status: He is alert and oriented to person, place, and time.     Comments: On his neurological exam, he does have decreased function in the left foot with dorsiflexion.  Right foot is unremarkable.  He has good pulses.  He has no tenderness to palpation over the left lower leg.  Psychiatric:        Behavior: Behavior normal.        Thought Content: Thought content normal.        Judgment: Judgment  normal.      Lab Results  Component Value Date   WBC 5.2 02/22/2023   HGB 10.3 (L) 02/22/2023   HCT 31.1 (L) 02/22/2023   MCV 94.2 02/22/2023   PLT 203 02/22/2023   Lab Results  Component Value Date   FERRITIN 555 (H) 07/23/2021  IRON 84 07/23/2021   TIBC 325 07/23/2021   UIBC 241 07/23/2021   IRONPCTSAT 26 07/23/2021   Lab Results  Component Value Date   RETICCTPCT 2.2 04/05/2019   RBC 3.30 (L) 02/22/2023   Lab Results  Component Value Date   KPAFRELGTCHN 12.3 01/26/2023   LAMBDASER 7.0 01/26/2023   KAPLAMBRATIO 1.76 (H) 01/26/2023   Lab Results  Component Value Date   IGGSERUM 548 (L) 01/26/2023   IGA 20 (L) 01/26/2023   IGMSERUM 52 01/26/2023   Lab Results  Component Value Date   TOTALPROTELP 5.7 (L) 01/26/2023   ALBUMINELP 3.5 01/26/2023   A1GS 0.2 01/26/2023   A2GS 0.7 01/26/2023   BETS 0.8 01/26/2023   BETA2SER 0.3 01/03/2015   GAMS 0.4 01/26/2023   MSPIKE Not Observed 01/26/2023   SPEI Comment 11/03/2022     Chemistry      Component Value Date/Time   NA 139 01/26/2023 0835   NA 137 04/08/2017 1141   NA 138 10/29/2015 1029   K 3.3 (L) 01/26/2023 0835   K 3.4 04/08/2017 1141   K 3.9 10/29/2015 1029   CL 103 01/26/2023 0835   CL 102 04/08/2017 1141   CO2 30 01/26/2023 0835   CO2 30 04/08/2017 1141   CO2 27 10/29/2015 1029   BUN 17 01/26/2023 0835   BUN 16 04/08/2017 1141   BUN 13.8 10/29/2015 1029   CREATININE 1.26 (H) 01/26/2023 0835   CREATININE 1.3 (H) 04/08/2017 1141   CREATININE 1.1 10/29/2015 1029      Component Value Date/Time   CALCIUM 9.9 01/26/2023 0835   CALCIUM 9.3 04/08/2017 1141   CALCIUM 9.3 10/29/2015 1029   ALKPHOS 39 01/26/2023 0835   ALKPHOS 66 04/08/2017 1141   ALKPHOS 52 10/29/2015 1029   AST 14 (L) 01/26/2023 0835   AST 20 10/29/2015 1029   ALT 10 01/26/2023 0835   ALT 21 04/08/2017 1141   ALT 14 10/29/2015 1029   BILITOT 0.7 01/26/2023 0835   BILITOT 0.75 10/29/2015 1029       Impression and Plan: Mr.  Deraad is a very pleasant 89 yo African American gentleman with relapsed IgG kappa myeloma. He has history of stem cell transplant in 2006 and since relapsed.    I am glad that he is doing a little bit better today.  Again, he still has little bit of the weakness in the left leg.  Hopefully, this will be taken care of or hopefully maybe will resolve on its own.  We will still go ahead with this treatment.  He has done very well with the treatment.  His hemoglobin is down a little bit.  It is still not low enough that we had to give him any ESA.  We will plan to get her back to see [place] in another month.   Josph Macho, MD 9/30/20249:06 AM

## 2023-02-22 NOTE — Progress Notes (Signed)
Give Zometa with next infusion appt, not today per Dr. Myna Hidalgo.

## 2023-02-22 NOTE — Progress Notes (Signed)
Per Dr. Myna Hidalgo pt ok to treat with creatinine 1.6

## 2023-02-22 NOTE — Patient Instructions (Signed)
[facility]  Discharge Instructions: Thank you for choosing [facility] to provide your oncology and hematology care.   If you have a lab appointment with the Cancer Center, please go directly to the Cancer Center and check in at the registration area.  Wear comfortable clothing and clothing appropriate for easy access to any Portacath or PICC line.   We strive to give you quality time with your provider. You may need to reschedule your appointment if you arrive late (15 or more minutes).  Arriving late affects you and other patients whose appointments are after yours.  Also, if you miss three or more appointments without notifying the office, you may be dismissed from the clinic at the provider's discretion.      For prescription refill requests, have your pharmacy contact our office and allow 72 hours for refills to be completed.    Today you received the following chemotherapy and/or immunotherapy agents:  Darzalex Faspro      To help prevent nausea and vomiting after your treatment, we encourage you to take your nausea medication as directed.  BELOW ARE SYMPTOMS THAT SHOULD BE REPORTED IMMEDIATELY: *FEVER GREATER THAN 100.4 F (38 C) OR HIGHER *CHILLS OR SWEATING *NAUSEA AND VOMITING THAT IS NOT CONTROLLED WITH YOUR NAUSEA MEDICATION *UNUSUAL SHORTNESS OF BREATH *UNUSUAL BRUISING OR BLEEDING *URINARY PROBLEMS (pain or burning when urinating, or frequent urination) *BOWEL PROBLEMS (unusual diarrhea, constipation, pain near the anus) TENDERNESS IN MOUTH AND THROAT WITH OR WITHOUT PRESENCE OF ULCERS (sore throat, sores in mouth, or a toothache) UNUSUAL RASH, SWELLING OR PAIN  UNUSUAL VAGINAL DISCHARGE OR ITCHING   Items with * indicate a potential emergency and should be followed up as soon as possible or go to the Emergency Department if any problems should occur.  Please show the CHEMOTHERAPY ALERT CARD or IMMUNOTHERAPY ALERT CARD at  check-in to the Emergency Department and triage nurse. Should you have questions after your visit or need to cancel or reschedule your appointment, please contact [facility] [place]  [phone] and follow the prompts.  Office hours are 8:00 a.m. to 4:30 p.m. Monday - Friday. Please note that voicemails left after 4:00 p.m. may not be returned until the following business day.  We are closed weekends and major holidays. You have access to a nurse at all times for urgent questions. Please call the main number to the clinic [phone] and follow the prompts.  For any non-urgent questions, you may also contact your provider using MyChart. We now offer e-Visits for anyone 64 and older to request care online for non-urgent symptoms. For details visit [email or web address].   Also download the MyChart app! Go to the app store, search "MyChart", open the app, select [facility], and log in with your MyChart username and password.

## 2023-02-23 LAB — KAPPA/LAMBDA LIGHT CHAINS
Kappa free light chain: 14.5 mg/L (ref 3.3–19.4)
Kappa, lambda light chain ratio: 1.67 — ABNORMAL HIGH (ref 0.26–1.65)
Lambda free light chains: 8.7 mg/L (ref 5.7–26.3)

## 2023-02-23 LAB — IGG, IGA, IGM
IgA: 22 mg/dL — ABNORMAL LOW (ref 61–437)
IgG (Immunoglobin G), Serum: 550 mg/dL — ABNORMAL LOW (ref 603–1613)
IgM (Immunoglobulin M), Srm: 62 mg/dL (ref 15–143)

## 2023-02-24 LAB — PROTEIN ELECTROPHORESIS, SERUM, WITH REFLEX
A/G Ratio: 1.5 (ref 0.7–1.7)
Albumin ELP: 3.4 g/dL (ref 2.9–4.4)
Alpha-1-Globulin: 0.2 g/dL (ref 0.0–0.4)
Alpha-2-Globulin: 0.8 g/dL (ref 0.4–1.0)
Beta Globulin: 0.9 g/dL (ref 0.7–1.3)
Gamma Globulin: 0.5 g/dL (ref 0.4–1.8)
Globulin, Total: 2.3 g/dL (ref 2.2–3.9)
Total Protein ELP: 5.7 g/dL — ABNORMAL LOW (ref 6.0–8.5)

## 2023-03-17 ENCOUNTER — Ambulatory Visit (INDEPENDENT_AMBULATORY_CARE_PROVIDER_SITE_OTHER): Payer: Medicare Other | Admitting: Orthopedic Surgery

## 2023-03-17 DIAGNOSIS — L600 Ingrowing nail: Secondary | ICD-10-CM

## 2023-03-17 MED ORDER — CYCLOBENZAPRINE HCL 10 MG PO TABS: 10.0000 mg | ORAL_TABLET | Freq: Two times a day (BID) | ORAL | 0 refills | Status: DC | PRN

## 2023-03-17 NOTE — Progress Notes (Signed)
Orthopedic Spine Surgery Office Note   Assessment: Patient is a 89 y.o. 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  After our conversation, patient wanted to continue with AFO use and activity modification because he feels he has been able to lift with that thus far.  He also has concerns about risks with surgery -For his periodic back pain, prescribed Flexeril to be used as needed -He also reports symptoms of carpal tunnel syndrome that he rates as tolerable so no further intervention was recommended at this time -Patient should return to office in 2 months, x-rays at next visit: none     Patient expressed understanding of the plan and all questions were answered to the patient's satisfaction.    ___________________________________________________________________________     History:   Patient is a 89 y.o. male who presents today for follow-up on his lumbar spine and talk about his right hand.  To recap, patient has had issues with imbalance and developed a left foot drop.  He has now had the foot drop for approximately 5 months.  He feels that his left foot has gotten slightly stronger since our last visit.  He has gotten an AFO and has been using that to ambulate.  He feels that his balance is better with the AFO and he is not requiring any ambulatory assist devices.  He says the foot drop is an annoyance, but he has been able to adjust to live with it.  He does have back pain that he feels when he is more active but he does not have any pain radiating into either lower extremity.  He says his back pain resolves if he sits down.  He also wanted talk about his right hand today.  He says he gets numbness and paresthesias in the thumb, index, and long finger.  He has  had this for a while.  He says that he usually gets better as the day goes on.  He said he sometimes has trouble using the hand if the numbness is particularly bad.  He says that the pain and paresthesias and numbness is currently tolerable and is not interested in trying any treatment at this time.  He was more curious about possible causes of the numbness/paresthesias.     Treatments tried: AFO, cane use, PT    Physical Exam:   General: no acute distress, appears stated age Neurologic: alert, answering questions appropriately, following commands Respiratory: unlabored breathing on room air, symmetric chest rise Psychiatric: appropriate affect, normal cadence to speech     MSK (spine):   -Strength exam                                                   Left                  Right EHL                              1/5                  1/5  TA                                 1/5                  4/5 GSC                             5/5                  5/5 Knee extension            5/5                  5/5 Hip flexion                    5/5                  5/5   -Sensory exam                           Sensation intact to light touch in L3-S1 nerve distributions of bilateral lower extremities   -Achilles DTR: 1/4 on the left, 1/4 on the right -Patellar tendon DTR: 1/4 on the left, 1/4 on the right   -Straight leg raise: negative bilaterally -Femoral nerve stretch test: negative bilaterally  -Clonus: no beats bilaterally -Negative hoffman bilaterally -Positive romberg   -Left hip exam: no pain through range of motion -Right hip exam: no pain through range of motion   Imaging: XR of the lumbar spine from 01/14/2023 was previously independently reviewed and interpreted, showing lumbar 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 Disc height loss seen at all lumbar levels. Anterior osteophyte formation noted at L2/3. No fracture or dislocation seen. No evidence of  instability on flexion/extension views.    MRI of the lumbar spine from 11/13/2022 was previously independently reviewed and interpreted, showing small synovial cyst on the left at L1/2. There is central stenosis at that level as well. Central stenosis L3/4. Central stenosis and lateral recess stenosis at L4/5 with a  synovial cyst on the right at L4/5. Foraminal stenosis on the left at L2/3. Disc herniation with central stenosis at T12/L1.      Patient name: Robert [name] Patient MRN: [id number] Date of visit: 03/17/23

## 2023-03-18 ENCOUNTER — Other Ambulatory Visit: Payer: Self-pay

## 2023-03-22 ENCOUNTER — Inpatient Hospital Stay (HOSPITAL_BASED_OUTPATIENT_CLINIC_OR_DEPARTMENT_OTHER): Payer: Medicare Other | Admitting: Hematology & Oncology

## 2023-03-22 ENCOUNTER — Inpatient Hospital Stay: Payer: Medicare Other

## 2023-03-22 ENCOUNTER — Inpatient Hospital Stay: Payer: Medicare Other | Attending: Hematology & Oncology

## 2023-03-22 VITALS — BP 169/84 | HR 64 | Temp 97.0°F | Resp 18 | Wt 209.0 lb

## 2023-03-22 DIAGNOSIS — C9000 Multiple myeloma not having achieved remission: Secondary | ICD-10-CM

## 2023-03-22 DIAGNOSIS — C9002 Multiple myeloma in relapse: Secondary | ICD-10-CM | POA: Insufficient documentation

## 2023-03-22 DIAGNOSIS — D630 Anemia in neoplastic disease: Secondary | ICD-10-CM | POA: Diagnosis not present

## 2023-03-22 DIAGNOSIS — Z79620 Long term (current) use of immunosuppressive biologic: Secondary | ICD-10-CM | POA: Diagnosis not present

## 2023-03-22 DIAGNOSIS — M899 Disorder of bone, unspecified: Secondary | ICD-10-CM

## 2023-03-22 DIAGNOSIS — N183 Chronic kidney disease, stage 3 unspecified: Secondary | ICD-10-CM

## 2023-03-22 DIAGNOSIS — Z5112 Encounter for antineoplastic immunotherapy: Secondary | ICD-10-CM | POA: Diagnosis present

## 2023-03-22 DIAGNOSIS — Z95828 Presence of other vascular implants and grafts: Secondary | ICD-10-CM

## 2023-03-22 DIAGNOSIS — D631 Anemia in chronic kidney disease: Secondary | ICD-10-CM

## 2023-03-22 LAB — CMP (CANCER CENTER ONLY)
ALT: 10 U/L (ref 0–44)
AST: 15 U/L (ref 15–41)
Albumin: 4.2 g/dL (ref 3.5–5.0)
Alkaline Phosphatase: 47 U/L (ref 38–126)
Anion gap: 8 (ref 5–15)
BUN: 21 mg/dL (ref 8–23)
CO2: 29 mmol/L (ref 22–32)
Calcium: 9.5 mg/dL (ref 8.9–10.3)
Chloride: 102 mmol/L (ref 98–111)
Creatinine: 1.22 mg/dL (ref 0.61–1.24)
GFR, Estimated: 57 mL/min — ABNORMAL LOW (ref >60)
Glucose, Bld: 104 mg/dL — ABNORMAL HIGH (ref 70–99)
Potassium: 3.4 mmol/L — ABNORMAL LOW (ref 3.5–5.1)
Sodium: 139 mmol/L (ref 135–145)
Total Bilirubin: 0.6 mg/dL (ref 0.3–1.2)
Total Protein: 5.6 g/dL — ABNORMAL LOW (ref 6.5–8.1)

## 2023-03-22 LAB — CBC WITH DIFFERENTIAL (CANCER CENTER ONLY)
Abs Immature Granulocytes: 0.01 10*3/uL (ref 0.00–0.07)
Basophils Absolute: 0 10*3/uL (ref 0.0–0.1)
Basophils Relative: 1 %
Eosinophils Absolute: 0.2 10*3/uL (ref 0.0–0.5)
Eosinophils Relative: 4 %
HCT: 31 % — ABNORMAL LOW (ref 39.0–52.0)
Hemoglobin: 10.3 g/dL — ABNORMAL LOW (ref 13.0–17.0)
Immature Granulocytes: 0 %
Lymphocytes Relative: 41 %
Lymphs Abs: 2.1 10*3/uL (ref 0.7–4.0)
MCH: 31 pg (ref 26.0–34.0)
MCHC: 33.2 g/dL (ref 30.0–36.0)
MCV: 93.4 fL (ref 80.0–100.0)
Monocytes Absolute: 0.5 10*3/uL (ref 0.1–1.0)
Monocytes Relative: 10 %
Neutro Abs: 2.3 10*3/uL (ref 1.7–7.7)
Neutrophils Relative %: 44 %
Platelet Count: 197 10*3/uL (ref 150–400)
RBC: 3.32 MIL/uL — ABNORMAL LOW (ref 4.22–5.81)
RDW: 13.4 % (ref 11.5–15.5)
WBC Count: 5.2 10*3/uL (ref 4.0–10.5)
nRBC: 0 % (ref 0.0–0.2)

## 2023-03-22 LAB — LACTATE DEHYDROGENASE: LDH: 200 U/L — ABNORMAL HIGH (ref 98–192)

## 2023-03-22 MED ORDER — ACETAMINOPHEN 325 MG PO TABS
650.0000 mg | ORAL_TABLET | Freq: Once | ORAL | Status: AC
Start: 2023-03-22 — End: 2023-03-22
  Administered 2023-03-22: 650 mg via ORAL
  Filled 2023-03-22: qty 2

## 2023-03-22 MED ORDER — DIPHENHYDRAMINE HCL 25 MG PO CAPS
50.0000 mg | ORAL_CAPSULE | Freq: Once | ORAL | Status: AC
  Administered 2023-03-22: 50 mg via ORAL
  Filled 2023-03-22: qty 2

## 2023-03-22 MED ORDER — SODIUM CHLORIDE 0.9% FLUSH
10.0000 mL | Freq: Once | INTRAVENOUS | Status: AC
Start: 2023-03-22 — End: 2023-03-22
  Administered 2023-03-22: 10 mL via INTRAVENOUS

## 2023-03-22 MED ORDER — DARATUMUMAB-HYALURONIDASE-FIHJ 1800-30000 MG-UT/15ML ~~LOC~~ SOLN
1800.0000 mg | Freq: Once | SUBCUTANEOUS | Status: AC
  Administered 2023-03-22: 1800 mg via SUBCUTANEOUS
  Filled 2023-03-22: qty 15

## 2023-03-22 MED ORDER — HEPARIN SOD (PORK) LOCK FLUSH 100 UNIT/ML IV SOLN
500.0000 [IU] | Freq: Once | INTRAVENOUS | Status: AC
Start: 2023-03-22 — End: 2023-03-22
  Administered 2023-03-22: 500 [IU] via INTRAVENOUS

## 2023-03-22 MED ORDER — SODIUM CHLORIDE 0.9 % IV SOLN
INTRAVENOUS | Status: DC
Start: 2023-03-22 — End: 2023-03-22

## 2023-03-22 MED ORDER — DEXAMETHASONE 4 MG PO TABS
20.0000 mg | ORAL_TABLET | Freq: Once | ORAL | Status: AC
Start: 2023-03-22 — End: 2023-03-22
  Administered 2023-03-22: 20 mg via ORAL
  Filled 2023-03-22: qty 5

## 2023-03-22 MED ORDER — ZOLEDRONIC ACID 4 MG/100ML IV SOLN
4.0000 mg | Freq: Once | INTRAVENOUS | Status: AC
  Administered 2023-03-22: 4 mg via INTRAVENOUS
  Filled 2023-03-22: qty 100

## 2023-03-22 NOTE — Progress Notes (Signed)
Hematology and Oncology Follow Up Visit  Robert [name] [id number] 03/31/1934 89 y.o. 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  He is still dealing with the left leg.  He has had 2 or 3 different opinions.  He does have a bulging disc at L5-S1.  He has foot drop in the left foot.  Again, he does not want to have surgery.  I think is doing some physical therapy.  He is doing well otherwise.  His weight is up a little bit.  His blood pressure is up a little bit.  His myeloma really has not been much of a problem.  So far, with his last monoclonal studies, there is no monoclonal spike in his blood.  His IgG level was 550 mg/dL.  His Kappa light chain is 1.5 mg/dL.  He has had no cough or shortness of breath.  He has had no nausea or vomiting.  There is been no change in bowel or bladder habits.  He has had no rashes.  He is a little bit of leg swelling.  Overall, I would say that his performance status is probably ECOG 1.      Medications:  Allergies as of 03/22/2023   No Known Allergies      Medication List        Accurate as of March 22, 2023  9:39 AM. If you have any questions, ask your nurse or doctor.          acetaminophen 650 MG CR tablet Commonly known as: TYLENOL Take 650 mg by mouth every 8 (eight) hours as needed for pain.   acetaminophen-codeine 300-30 MG tablet Commonly known as: TYLENOL #3 Take 1 tablet by mouth every 8 (eight)  hours as needed for moderate pain.   aspirin 81 MG tablet Take 81 mg by mouth daily.   benzonatate 100 MG capsule Commonly known as: TESSALON Take 1 capsule (100 mg total) by mouth 3 (three) times daily as needed for cough.   bimatoprost 0.01 % Soln Commonly known as: LUMIGAN Place 1 drop into both eyes at bedtime.   CALTRATE 600+D PLUS PO Take 1 tablet by mouth daily.   carvedilol 25 MG tablet Commonly known as: COREG Take 25 mg by mouth 2 (two) times daily.   cloNIDine 0.1 MG tablet Commonly known as: CATAPRES Take 0.1 mg by mouth 2 (two) times daily as needed (Elevated BP).   cyclobenzaprine 10 MG tablet Commonly known as: FLEXERIL Take 1 tablet (10 mg total) by mouth 2 (two) times daily as needed (back pain, muscle spasms).   dexamethasone 4 MG tablet Commonly known as: DECADRON Take 5 tablets prior to immunotherapy treatment What changed:  how much to take how to take this when to take this   diclofenac Sodium 1 % Gel Commonly known as: VOLTAREN Apply 2 g topically 4 (four) times daily. As  needed   famciclovir 250 MG tablet Commonly known as: FAMVIR TAKE 1 TABLET DAILY   Flonase Allergy Relief 50 MCG/ACT nasal spray Generic drug: fluticasone Place 1 spray into both nostrils daily.   losartan 100 MG tablet Commonly known as: COZAAR Take 100 mg by mouth daily.   meloxicam 15 MG tablet Commonly known as: MOBIC Take 1 tablet by mouth daily as needed for pain.   montelukast 10 MG tablet Commonly known as: SINGULAIR TAKE 1 TABLET AT BEDTIME   potassium chloride SA 20 MEQ tablet Commonly known as: KLOR-CON M Take 1 tablet (20 mEq total) by mouth daily.   rosuvastatin 20 MG tablet Commonly known as: CRESTOR Take 20 mg by mouth at bedtime.   SYSTANE OP Place 1 drop into both eyes daily as needed (dry eyes).   Zoledronic Acid 4 MG/100ML IVPB Commonly known as: ZOMETA Inject 4 mg into the vein every 30 (thirty) days. 07/03/2022 Every 4 months.         Allergies: No Known Allergies  Past Medical History, Surgical history, Social history, and Family History were reviewed and updated.  Review of Systems: Review of Systems  Constitutional: Negative.   HENT: Negative.    Eyes: Negative.   Respiratory: Negative.    Cardiovascular: Negative.   Gastrointestinal: Negative.   Genitourinary: Negative.   Musculoskeletal:  Positive for joint pain.  Skin: Negative.   Neurological: Negative.   Endo/Heme/Allergies: Negative.   Psychiatric/Behavioral: Negative.       Physical Exam:  weight is 209 lb (94.8 kg). His temperature is 97 F (36.1 C) (abnormal). His blood pressure is 169/84 (abnormal) and his pulse is 64. His respiration is 18 and oxygen saturation is 100%.   Wt Readings from Last 3 Encounters:  03/22/23 209 lb (94.8 kg)  02/22/23 206 lb (93.4 kg)  01/26/23 210 lb 6.4 oz (95.4 kg)   Physical Exam Vitals reviewed.  HENT:     Head: Normocephalic and atraumatic.  Eyes:     Pupils: Pupils are equal, round, and reactive to light.  Cardiovascular:     Rate and Rhythm: Normal rate and regular rhythm.     Heart sounds: Normal heart sounds.  Pulmonary:     Effort: Pulmonary effort is normal.     Breath sounds: Normal breath sounds.  Abdominal:     General: Bowel sounds are normal.     Palpations: Abdomen is soft.  Musculoskeletal:        General: No tenderness or deformity. Normal range of motion.     Cervical back: Normal range of motion.  Lymphadenopathy:     Cervical: No cervical adenopathy.  Skin:    General: Skin is warm and dry.     Findings: No erythema or rash.  Neurological:     Mental Status: He is alert and oriented to person, place, and time.     Comments: On his neurological exam, he does have decreased function in the left foot with dorsiflexion.  Right foot is unremarkable.  He has good pulses.  He has no tenderness to palpation over the left lower leg.  Psychiatric:        Behavior: Behavior normal.         Thought Content: Thought content normal.        Judgment: Judgment normal.      Lab Results  Component Value Date   WBC 5.2 02/22/2023   HGB 10.3 (L) 02/22/2023   HCT 31.1 (L) 02/22/2023   MCV 94.2 02/22/2023  PLT 203 02/22/2023   Lab Results  Component Value Date   FERRITIN 555 (H) 07/23/2021   IRON 84 07/23/2021   TIBC 325 07/23/2021   UIBC 241 07/23/2021   IRONPCTSAT 26 07/23/2021   Lab Results  Component Value Date   RETICCTPCT 2.2 04/05/2019   RBC 3.30 (L) 02/22/2023   Lab Results  Component Value Date   KPAFRELGTCHN 14.5 02/22/2023   LAMBDASER 8.7 02/22/2023   KAPLAMBRATIO 1.67 (H) 02/22/2023   Lab Results  Component Value Date   IGGSERUM 550 (L) 02/22/2023   IGA 22 (L) 02/22/2023   IGMSERUM 62 02/22/2023   Lab Results  Component Value Date   TOTALPROTELP 5.7 (L) 02/22/2023   ALBUMINELP 3.4 02/22/2023   A1GS 0.2 02/22/2023   A2GS 0.8 02/22/2023   BETS 0.9 02/22/2023   BETA2SER 0.3 01/03/2015   GAMS 0.5 02/22/2023   MSPIKE Not Observed 02/22/2023   SPEI Comment 11/03/2022     Chemistry      Component Value Date/Time   NA 139 02/22/2023 0835   NA 137 04/08/2017 1141   NA 138 10/29/2015 1029   K 3.4 (L) 02/22/2023 0835   K 3.4 04/08/2017 1141   K 3.9 10/29/2015 1029   CL 102 02/22/2023 0835   CL 102 04/08/2017 1141   CO2 28 02/22/2023 0835   CO2 30 04/08/2017 1141   CO2 27 10/29/2015 1029   BUN 25 (H) 02/22/2023 0835   BUN 16 04/08/2017 1141   BUN 13.8 10/29/2015 1029   CREATININE 1.62 (H) 02/22/2023 0835   CREATININE 1.3 (H) 04/08/2017 1141   CREATININE 1.1 10/29/2015 1029      Component Value Date/Time   CALCIUM 9.4 02/22/2023 0835   CALCIUM 9.3 04/08/2017 1141   CALCIUM 9.3 10/29/2015 1029   ALKPHOS 37 (L) 02/22/2023 0835   ALKPHOS 66 04/08/2017 1141   ALKPHOS 52 10/29/2015 1029   AST 16 02/22/2023 0835   AST 20 10/29/2015 1029   ALT 14 02/22/2023 0835   ALT 21 04/08/2017 1141   ALT 14 10/29/2015 1029   BILITOT 0.6  02/22/2023 0835   BILITOT 0.75 10/29/2015 1029       Impression and Plan: Mr. Like is a very pleasant 89 yo African American gentleman with relapsed IgG kappa myeloma. He has history of stem cell transplant in 2006 and since relapsed.    I would have to say that the daratumumab really is working well for him.  I do not see any evidence of progression of the myeloma.  I wish that his poor back we get fixed.  I think this would really help his quality of life.  He is little bit anemic.  However, we do not have to intervene with this right now.  We will still get him back in 1 month.   Josph Macho, MD 10/28/20249:39 AM

## 2023-03-22 NOTE — Patient Instructions (Signed)
Zoledronic Acid Injection (Cancer) What is this medication? ZOLEDRONIC ACID (ZOE le dron ik AS id) treats high calcium levels in the blood caused by cancer. It may also be used with chemotherapy to treat weakened bones caused by cancer. It works by slowing down the release of calcium from bones. This lowers calcium levels in your blood. It also makes your bones stronger and less likely to break (fracture). It belongs to a group of medications called bisphosphonates. This medicine may be used for other purposes; ask your health care provider or pharmacist if you have questions. COMMON BRAND NAME(S): Zometa, Zometa Powder What should I tell my care team before I take this medication? They need to know if you have any of these conditions: Dehydration Dental disease Kidney disease Liver disease Low levels of calcium in the blood Lung or breathing disease, such as asthma Receiving steroids, such as dexamethasone or prednisone An unusual or allergic reaction to zoledronic acid, other medications, foods, dyes, or preservatives Pregnant or trying to get pregnant Breast-feeding How should I use this medication? This medication is injected into a vein. It is given by your care team in a hospital or clinic setting. Talk to your care team about the use of this medication in children. Special care may be needed. Overdosage: If you think you have taken too much of this medicine contact a poison control center or emergency room at once. NOTE: This medicine is only for you. Do not share this medicine with others. What if I miss a dose? Keep appointments for follow-up doses. It is important not to miss your dose. Call your care team if you are unable to keep an appointment. What may interact with this medication? Certain antibiotics given by injection Diuretics, such as bumetanide, furosemide NSAIDs, medications for pain and inflammation, such as ibuprofen or naproxen Teriparatide Thalidomide This list  may not describe all possible interactions. Give your health care provider a list of all the medicines, herbs, non-prescription drugs, or dietary supplements you use. Also tell them if you smoke, drink alcohol, or use illegal drugs. Some items may interact with your medicine. What should I watch for while using this medication? Visit your care team for regular checks on your progress. It may be some time before you see the benefit from this medication. Some people who take this medication have severe bone, joint, or muscle pain. This medication may also increase your risk for jaw problems or a broken thigh bone. Tell your care team right away if you have severe pain in your jaw, bones, joints, or muscles. Tell you care team if you have any pain that does not go away or that gets worse. Tell your dentist and dental surgeon that you are taking this medication. You should not have major dental surgery while on this medication. See your dentist to have a dental exam and fix any dental problems before starting this medication. Take good care of your teeth while on this medication. Make sure you see your dentist for regular follow-up appointments. You should make sure you get enough calcium and vitamin D while you are taking this medication. Discuss the foods you eat and the vitamins you take with your care team. Check with your care team if you have severe diarrhea, nausea, and vomiting, or if you sweat a lot. The loss of too much body fluid may make it dangerous for you to take this medication. You may need bloodwork while taking this medication. Talk to your care team if  you wish to become pregnant or think you might be pregnant. This medication can cause serious birth defects. What side effects may I notice from receiving this medication? Side effects that you should report to your care team as soon as possible: Allergic reactions--skin rash, itching, hives, swelling of the face, lips, tongue, or  throat Kidney injury--decrease in the amount of urine, swelling of the ankles, hands, or feet Low calcium level--muscle pain or cramps, confusion, tingling, or numbness in the hands or feet Osteonecrosis of the jaw--pain, swelling, or redness in the mouth, numbness of the jaw, poor healing after dental work, unusual discharge from the mouth, visible bones in the mouth Severe bone, joint, or muscle pain Side effects that usually do not require medical attention (report to your care team if they continue or are bothersome): Constipation Fatigue Fever Loss of appetite Nausea Stomach pain This list may not describe all possible side effects. Call your doctor for medical advice about side effects. You may report side effects to FDA at 1-800-FDA-1088. Where should I keep my medication? This medication is given in a hospital or clinic. It will not be stored at home. NOTE: This sheet is a summary. It may not cover all possible information. If you have questions about this medicine, talk to your doctor, pharmacist, or health care provider.  2024 Elsevier/Gold Standard (2021-07-04 00:00:00)  Daratumumab Injection What is this medication? DARATUMUMAB (dar a toom ue mab) treats multiple myeloma, a type of bone marrow cancer. It works by helping your immune system slow or stop the spread of cancer cells. It is a monoclonal antibody. This medicine may be used for other purposes; ask your health care provider or pharmacist if you have questions. COMMON BRAND NAME(S): DARZALEX What should I tell my care team before I take this medication? They need to know if you have any of these conditions: Hereditary fructose intolerance Infection, such as chickenpox, herpes, hepatitis B Lung or breathing disease, such as asthma, COPD An unusual or allergic reaction to daratumumab, sorbitol, other medications, foods, dyes, or preservatives Pregnant or trying to get pregnant Breastfeeding How should I use this  medication? This medication is injected into a vein. It is given by your care team in a hospital or clinic setting. Talk to your care team about the use of this medication in children. Special care may be needed. Overdosage: If you think you have taken too much of this medicine contact a poison control center or emergency room at once. NOTE: This medicine is only for you. Do not share this medicine with others. What if I miss a dose? Keep appointments for follow-up doses. It is important not to miss your dose. Call your care team if you are unable to keep an appointment. What may interact with this medication? Interactions have not been studied. This list may not describe all possible interactions. Give your health care provider a list of all the medicines, herbs, non-prescription drugs, or dietary supplements you use. Also tell them if you smoke, drink alcohol, or use illegal drugs. Some items may interact with your medicine. What should I watch for while using this medication? Your condition will be monitored carefully while you are receiving this medication. This medication can cause serious allergic reactions. To reduce your risk, your care team may give you other medication to take before receiving this one. Be sure to follow the directions from your care team. This medication can affect the results of blood tests to match your blood type. These  changes can last for up to 6 months after the final dose. Your care team will do blood tests to match your blood type before you start treatment. Tell all of your care team that you are being treated with this medication before receiving a blood transfusion. This medication can affect the results of some tests used to determine treatment response; extra tests may be needed to evaluate response. Talk to your care team if you wish to become pregnant or think you are pregnant. This medication can cause serious birth defects if taken during pregnancy and for  3 months after the last dose. A reliable form of contraception is recommended while taking this medication and for 3 months after the last dose. Talk to your care team about effective forms of contraception. Do not breast-feed while taking this medication. What side effects may I notice from receiving this medication? Side effects that you should report to your care team as soon as possible: Allergic reactions--skin rash, itching, hives, swelling of the face, lips, tongue, or throat Infection--fever, chills, cough, sore throat, wounds that don't heal, pain or trouble when passing urine, general feeling of discomfort or being unwell Infusion reactions--chest pain, shortness of breath or trouble breathing, feeling faint or lightheaded Unusual bruising or bleeding Side effects that usually do not require medical attention (report to your care team if they continue or are bothersome): Constipation Diarrhea Fatigue Nausea Pain, tingling, or numbness in the hands or feet Swelling of the ankles, hands, or feet This list may not describe all possible side effects. Call your doctor for medical advice about side effects. You may report side effects to FDA at 1-800-FDA-1088. Where should I keep my medication? This medication is given in a hospital or clinic. It will not be stored at home. NOTE: This sheet is a summary. It may not cover all possible information. If you have questions about this medicine, talk to your doctor, pharmacist, or health care provider.  2024 Elsevier/Gold Standard (2022-03-19 00:00:00)

## 2023-03-23 LAB — IGG, IGA, IGM
IgA: 26 mg/dL — ABNORMAL LOW (ref 61–437)
IgG (Immunoglobin G), Serum: 549 mg/dL — ABNORMAL LOW (ref 603–1613)
IgM (Immunoglobulin M), Srm: 62 mg/dL (ref 15–143)

## 2023-03-23 LAB — KAPPA/LAMBDA LIGHT CHAINS
Kappa free light chain: 14.5 mg/L (ref 3.3–19.4)
Kappa, lambda light chain ratio: 2.07 — ABNORMAL HIGH (ref 0.26–1.65)
Lambda free light chains: 7 mg/L (ref 5.7–26.3)

## 2023-03-24 LAB — PROTEIN ELECTROPHORESIS, SERUM, WITH REFLEX
A/G Ratio: 1.5 (ref 0.7–1.7)
Albumin ELP: 3.4 g/dL (ref 2.9–4.4)
Alpha-1-Globulin: 0.2 g/dL (ref 0.0–0.4)
Alpha-2-Globulin: 0.7 g/dL (ref 0.4–1.0)
Beta Globulin: 0.9 g/dL (ref 0.7–1.3)
Gamma Globulin: 0.5 g/dL (ref 0.4–1.8)
Globulin, Total: 2.3 g/dL (ref 2.2–3.9)
Total Protein ELP: 5.7 g/dL — ABNORMAL LOW (ref 6.0–8.5)

## 2023-03-25 ENCOUNTER — Other Ambulatory Visit: Payer: Self-pay

## 2023-04-02 ENCOUNTER — Ambulatory Visit (INDEPENDENT_AMBULATORY_CARE_PROVIDER_SITE_OTHER): Payer: Medicare Other | Admitting: Podiatry

## 2023-04-02 ENCOUNTER — Encounter: Payer: Self-pay | Admitting: Podiatry

## 2023-04-02 DIAGNOSIS — L600 Ingrowing nail: Secondary | ICD-10-CM

## 2023-04-02 DIAGNOSIS — M21372 Foot drop, left foot: Secondary | ICD-10-CM

## 2023-04-04 NOTE — Progress Notes (Signed)
Subjective:   Patient ID: Robert [name], male   DOB: 89 y.o.   MRN: [id number]   HPI Patient presents stating concerned about his big toenail left and the discoloration that is occurring.  He also has foot drop states the nail is not really hurting but he does not know the problem   ROS      Objective:  Physical Exam  Neurovascular status unchanged with a thickened dystrophic obviously traumatized left big toenail left foot     Assessment:  Damage nailbed left that is not causing him issues and is not loose with no erythema edema drainage and foot drop with brace     Plan:  Do not recommend anything else currently as it is not hurting and continue his AFO bracing that he is utilizing

## 2023-04-15 ENCOUNTER — Other Ambulatory Visit: Payer: Self-pay

## 2023-04-19 ENCOUNTER — Inpatient Hospital Stay: Payer: Medicare Other

## 2023-04-19 ENCOUNTER — Inpatient Hospital Stay (HOSPITAL_BASED_OUTPATIENT_CLINIC_OR_DEPARTMENT_OTHER): Payer: Medicare Other | Admitting: Hematology & Oncology

## 2023-04-19 ENCOUNTER — Encounter: Payer: Self-pay | Admitting: Hematology & Oncology

## 2023-04-19 ENCOUNTER — Other Ambulatory Visit: Payer: Self-pay

## 2023-04-19 ENCOUNTER — Inpatient Hospital Stay: Payer: Medicare Other | Attending: Hematology & Oncology

## 2023-04-19 VITALS — BP 131/73 | HR 67 | Temp 97.9°F | Resp 18 | Ht 71.0 in | Wt 208.0 lb

## 2023-04-19 VITALS — BP 165/92 | HR 55

## 2023-04-19 DIAGNOSIS — D630 Anemia in neoplastic disease: Secondary | ICD-10-CM | POA: Diagnosis not present

## 2023-04-19 DIAGNOSIS — Z5112 Encounter for antineoplastic immunotherapy: Secondary | ICD-10-CM | POA: Insufficient documentation

## 2023-04-19 DIAGNOSIS — C9000 Multiple myeloma not having achieved remission: Secondary | ICD-10-CM

## 2023-04-19 DIAGNOSIS — C9002 Multiple myeloma in relapse: Secondary | ICD-10-CM | POA: Diagnosis present

## 2023-04-19 LAB — CMP (CANCER CENTER ONLY)
ALT: 11 U/L (ref 0–44)
AST: 15 U/L (ref 15–41)
Albumin: 4.1 g/dL (ref 3.5–5.0)
Alkaline Phosphatase: 50 U/L (ref 38–126)
Anion gap: 7 (ref 5–15)
BUN: 28 mg/dL — ABNORMAL HIGH (ref 8–23)
CO2: 29 mmol/L (ref 22–32)
Calcium: 10.3 mg/dL (ref 8.9–10.3)
Chloride: 103 mmol/L (ref 98–111)
Creatinine: 1.26 mg/dL — ABNORMAL HIGH (ref 0.61–1.24)
GFR, Estimated: 55 mL/min — ABNORMAL LOW (ref >60)
Glucose, Bld: 113 mg/dL — ABNORMAL HIGH (ref 70–99)
Potassium: 3.5 mmol/L (ref 3.5–5.1)
Sodium: 139 mmol/L (ref 135–145)
Total Bilirubin: 0.7 mg/dL (ref <1.2)
Total Protein: 6.1 g/dL — ABNORMAL LOW (ref 6.5–8.1)

## 2023-04-19 LAB — CBC WITH DIFFERENTIAL (CANCER CENTER ONLY)
Abs Immature Granulocytes: 0.01 10*3/uL (ref 0.00–0.07)
Basophils Absolute: 0 10*3/uL (ref 0.0–0.1)
Basophils Relative: 1 %
Eosinophils Absolute: 0.2 10*3/uL (ref 0.0–0.5)
Eosinophils Relative: 3 %
HCT: 33.3 % — ABNORMAL LOW (ref 39.0–52.0)
Hemoglobin: 11.1 g/dL — ABNORMAL LOW (ref 13.0–17.0)
Immature Granulocytes: 0 %
Lymphocytes Relative: 38 %
Lymphs Abs: 2.5 10*3/uL (ref 0.7–4.0)
MCH: 31.4 pg (ref 26.0–34.0)
MCHC: 33.3 g/dL (ref 30.0–36.0)
MCV: 94.1 fL (ref 80.0–100.0)
Monocytes Absolute: 0.6 10*3/uL (ref 0.1–1.0)
Monocytes Relative: 10 %
Neutro Abs: 3.2 10*3/uL (ref 1.7–7.7)
Neutrophils Relative %: 48 %
Platelet Count: 199 10*3/uL (ref 150–400)
RBC: 3.54 MIL/uL — ABNORMAL LOW (ref 4.22–5.81)
RDW: 13.7 % (ref 11.5–15.5)
WBC Count: 6.5 10*3/uL (ref 4.0–10.5)
nRBC: 0 % (ref 0.0–0.2)

## 2023-04-19 LAB — LACTATE DEHYDROGENASE: LDH: 179 U/L (ref 98–192)

## 2023-04-19 MED ORDER — DARATUMUMAB-HYALURONIDASE-FIHJ 1800-30000 MG-UT/15ML ~~LOC~~ SOLN
1800.0000 mg | Freq: Once | SUBCUTANEOUS | Status: AC
Start: 2023-04-19 — End: 2023-04-19
  Administered 2023-04-19: 1800 mg via SUBCUTANEOUS
  Filled 2023-04-19: qty 15

## 2023-04-19 MED ORDER — DIPHENHYDRAMINE HCL 25 MG PO CAPS
50.0000 mg | ORAL_CAPSULE | Freq: Once | ORAL | Status: AC
  Administered 2023-04-19: 50 mg via ORAL
  Filled 2023-04-19: qty 2

## 2023-04-19 MED ORDER — ACETAMINOPHEN 325 MG PO TABS
650.0000 mg | ORAL_TABLET | Freq: Once | ORAL | Status: AC
  Administered 2023-04-19: 650 mg via ORAL
  Filled 2023-04-19: qty 2

## 2023-04-19 MED ORDER — DEXAMETHASONE 4 MG PO TABS
20.0000 mg | ORAL_TABLET | Freq: Once | ORAL | Status: AC
  Administered 2023-04-19: 20 mg via ORAL
  Filled 2023-04-19: qty 5

## 2023-04-19 NOTE — Patient Instructions (Signed)
[facility] - A DEPT OF [facility]  Discharge Instructions: Thank you for choosing [facility] to provide your oncology and hematology care.   If you have a lab appointment with the Cancer Center, please go directly to the Cancer Center and check in at the registration area.  Wear comfortable clothing and clothing appropriate for easy access to any Portacath or PICC line.   We strive to give you quality time with your provider. You may need to reschedule your appointment if you arrive late (15 or more minutes).  Arriving late affects you and other patients whose appointments are after yours.  Also, if you miss three or more appointments without notifying the office, you may be dismissed from the clinic at the provider's discretion.      For prescription refill requests, have your pharmacy contact our office and allow 72 hours for refills to be completed.    Today you received the following chemotherapy and/or immunotherapy agents darzalex     To help prevent nausea and vomiting after your treatment, we encourage you to take your nausea medication as directed.  BELOW ARE SYMPTOMS THAT SHOULD BE REPORTED IMMEDIATELY: *FEVER GREATER THAN 100.4 F (38 C) OR HIGHER *CHILLS OR SWEATING *NAUSEA AND VOMITING THAT IS NOT CONTROLLED WITH YOUR NAUSEA MEDICATION *UNUSUAL SHORTNESS OF BREATH *UNUSUAL BRUISING OR BLEEDING *URINARY PROBLEMS (pain or burning when urinating, or frequent urination) *BOWEL PROBLEMS (unusual diarrhea, constipation, pain near the anus) TENDERNESS IN MOUTH AND THROAT WITH OR WITHOUT PRESENCE OF ULCERS (sore throat, sores in mouth, or a toothache) UNUSUAL RASH, SWELLING OR PAIN  UNUSUAL VAGINAL DISCHARGE OR ITCHING   Items with * indicate a potential emergency and should be followed up as soon as possible or go to the Emergency Department if any problems should occur.  Please show the CHEMOTHERAPY ALERT CARD or IMMUNOTHERAPY  ALERT CARD at check-in to the Emergency Department and triage nurse. Should you have questions after your visit or need to cancel or reschedule your appointment, please contact [facility] - A DEPT OF [facility]  [phone] and follow the prompts.  Office hours are 8:00 a.m. to 4:30 p.m. Monday - Friday. Please note that voicemails left after 4:00 p.m. may not be returned until the following business day.  We are closed weekends and major holidays. You have access to a nurse at all times for urgent questions. Please call the main number to the clinic [phone] and follow the prompts.  For any non-urgent questions, you may also contact your provider using MyChart. We now offer e-Visits for anyone 60 and older to request care online for non-urgent symptoms. For details visit [email or web address].   Also download the MyChart app! Go to the app store, search "MyChart", open the app, select [facility], and log in with your MyChart username and password.

## 2023-04-19 NOTE — Progress Notes (Signed)
Hematology and Oncology Follow Up Visit  CREIGHTON WIX [id number] [birth year] 89 y.o. 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 Gabay is here today for follow-up and treatment.  He is still dealing with the left leg.  He has had 2 or 3 different opinions.  He does have a bulging disc at L5-S1.  He has foot drop in the left foot.  Again, he does not want to have surgery.  He has finished his physical therapy.  I think he is going do some exercises at home.  As far as the myeloma goes he is doing well with this.  There is no monoclonal spike in his last blood work.  His last IgG level back in October was 550 mg/dL.  The kappa light chain was 1.5 mg/dL.  He has had no fever.  He has had no bleeding.  He has had no change in bowel or bladder habits.  He has had no rashes.  It sounds like he will have a very nice Thanksgiving.  Overall, I would say performance status about ECOG 1.    Medications:  Allergies as of 04/19/2023   No Known Allergies      Medication List        Accurate as of April 19, 2023 10:37 AM. If you have any questions, ask your nurse or doctor.          acetaminophen 650 MG CR tablet Commonly known as: TYLENOL Take 650 mg by mouth every 8 (eight) hours as needed for pain.   acetaminophen-codeine 300-30 MG tablet Commonly known as: TYLENOL #3 Take 1 tablet by mouth every 8 (eight) hours as needed for moderate pain.   aspirin 81 MG tablet Take 81 mg by mouth daily.    benzonatate 100 MG capsule Commonly known as: TESSALON Take 1 capsule (100 mg total) by mouth 3 (three) times daily as needed for cough.   bimatoprost 0.01 % Soln Commonly known as: LUMIGAN Place 1 drop into both eyes at bedtime.   CALTRATE 600+D PLUS PO Take 1 tablet by mouth daily.   carvedilol 25 MG tablet Commonly known as: COREG Take 25 mg by mouth 2 (two) times daily.   cloNIDine 0.1 MG tablet Commonly known as: CATAPRES Take 0.1 mg by mouth 2 (two) times daily as needed (Elevated BP).   cyclobenzaprine 10 MG tablet Commonly known as: FLEXERIL Take 1 tablet (10 mg total) by mouth 2 (two) times daily as needed (back pain, muscle spasms).   dexamethasone 4 MG tablet Commonly known as: DECADRON Take 5 tablets prior to immunotherapy treatment What changed:  how much to take how to take this when to take this   diclofenac Sodium 1 % Gel Commonly known as: VOLTAREN Apply 2 g topically 4 (four) times daily. As needed   famciclovir 250 MG tablet Commonly known as: FAMVIR TAKE 1 TABLET DAILY   Flonase Allergy Relief 50 MCG/ACT  nasal spray Generic drug: fluticasone Place 1 spray into both nostrils daily.   losartan 100 MG tablet Commonly known as: COZAAR Take 100 mg by mouth daily.   meloxicam 15 MG tablet Commonly known as: MOBIC Take 1 tablet by mouth daily as needed for pain.   metolazone 5 MG tablet Commonly known as: ZAROXOLYN Take 5 mg by mouth daily.   montelukast 10 MG tablet Commonly known as: SINGULAIR TAKE 1 TABLET AT BEDTIME   potassium chloride SA 20 MEQ tablet Commonly known as: KLOR-CON M Take 1 tablet (20 mEq total) by mouth daily.   rosuvastatin 20 MG tablet Commonly known as: CRESTOR Take 20 mg by mouth at bedtime.   SYSTANE OP Place 1 drop into both eyes daily as needed (dry eyes).   Zoledronic Acid 4 MG/100ML IVPB Commonly known as: ZOMETA Inject 4 mg into the vein every 30 (thirty) days. 07/03/2022 Every 4 months.         Allergies: No Known Allergies  Past Medical History, Surgical history, Social history, and Family History were reviewed and updated.  Review of Systems: Review of Systems  Constitutional: Negative.   HENT: Negative.    Eyes: Negative.   Respiratory: Negative.    Cardiovascular: Negative.   Gastrointestinal: Negative.   Genitourinary: Negative.   Musculoskeletal:  Positive for joint pain.  Skin: Negative.   Neurological: Negative.   Endo/Heme/Allergies: Negative.   Psychiatric/Behavioral: Negative.       Physical Exam:  height is 5\' 11"  (1.803 m) and weight is 208 lb (94.3 kg). His oral temperature is 97.9 F (36.6 C). His blood pressure is 131/73 and his pulse is 67. His respiration is 18 and oxygen saturation is 100%.   Wt Readings from Last 3 Encounters:  04/19/23 208 lb (94.3 kg)  03/22/23 209 lb (94.8 kg)  02/22/23 206 lb (93.4 kg)   Physical Exam Vitals reviewed.  HENT:     Head: Normocephalic and atraumatic.  Eyes:     Pupils: Pupils are equal, round, and reactive to light.  Cardiovascular:     Rate and Rhythm: Normal rate and regular rhythm.     Heart sounds: Normal heart sounds.  Pulmonary:     Effort: Pulmonary effort is normal.     Breath sounds: Normal breath sounds.  Abdominal:     General: Bowel sounds are normal.     Palpations: Abdomen is soft.  Musculoskeletal:        General: No tenderness or deformity. Normal range of motion.     Cervical back: Normal range of motion.  Lymphadenopathy:     Cervical: No cervical adenopathy.  Skin:    General: Skin is warm and dry.     Findings: No erythema or rash.  Neurological:     Mental Status: He is alert and oriented to person, place, and time.     Comments: On his neurological exam, he does have decreased function in the left foot with dorsiflexion.  Right foot is unremarkable.  He has good pulses.  He has no tenderness to palpation over the left lower leg.  Psychiatric:        Behavior: Behavior  normal.        Thought Content: Thought content normal.        Judgment: Judgment normal.      Lab Results  Component Value Date   WBC 6.5 04/19/2023   HGB 11.1 (L) 04/19/2023   HCT 33.3 (L) 04/19/2023   MCV 94.1 04/19/2023  PLT 199 04/19/2023   Lab Results  Component Value Date   FERRITIN 555 (H) 07/23/2021   IRON 84 07/23/2021   TIBC 325 07/23/2021   UIBC 241 07/23/2021   IRONPCTSAT 26 07/23/2021   Lab Results  Component Value Date   RETICCTPCT 2.2 04/05/2019   RBC 3.54 (L) 04/19/2023   Lab Results  Component Value Date   KPAFRELGTCHN 14.5 03/22/2023   LAMBDASER 7.0 03/22/2023   KAPLAMBRATIO 2.07 (H) 03/22/2023   Lab Results  Component Value Date   IGGSERUM 549 (L) 03/22/2023   IGA 26 (L) 03/22/2023   IGMSERUM 62 03/22/2023   Lab Results  Component Value Date   TOTALPROTELP 5.7 (L) 03/22/2023   ALBUMINELP 3.4 03/22/2023   A1GS 0.2 03/22/2023   A2GS 0.7 03/22/2023   BETS 0.9 03/22/2023   BETA2SER 0.3 01/03/2015   GAMS 0.5 03/22/2023   MSPIKE Not Observed 03/22/2023   SPEI Comment 11/03/2022     Chemistry      Component Value Date/Time   NA 139 04/19/2023 0923   NA 137 04/08/2017 1141   NA 138 10/29/2015 1029   K 3.5 04/19/2023 0923   K 3.4 04/08/2017 1141   K 3.9 10/29/2015 1029   CL 103 04/19/2023 0923   CL 102 04/08/2017 1141   CO2 29 04/19/2023 0923   CO2 30 04/08/2017 1141   CO2 27 10/29/2015 1029   BUN 28 (H) 04/19/2023 0923   BUN 16 04/08/2017 1141   BUN 13.8 10/29/2015 1029   CREATININE 1.26 (H) 04/19/2023 0923   CREATININE 1.3 (H) 04/08/2017 1141   CREATININE 1.1 10/29/2015 1029      Component Value Date/Time   CALCIUM 10.3 04/19/2023 0923   CALCIUM 9.3 04/08/2017 1141   CALCIUM 9.3 10/29/2015 1029   ALKPHOS 50 04/19/2023 0923   ALKPHOS 66 04/08/2017 1141   ALKPHOS 52 10/29/2015 1029   AST 15 04/19/2023 0923   AST 20 10/29/2015 1029   ALT 11 04/19/2023 0923   ALT 21 04/08/2017 1141   ALT 14 10/29/2015 1029   BILITOT 0.7  04/19/2023 0923   BILITOT 0.75 10/29/2015 1029       Impression and Plan: Mr. Tipton is a very pleasant 89 yo African American gentleman with relapsed IgG kappa myeloma. He has history of stem cell transplant in 2006 and since relapsed.    I would have to say that the daratumumab really is working well for him.  I do not see any evidence of progression of the myeloma.  Because doing so well, we will plan to get him back after the Holiday season.  I do not see a problem with him putting treatment off for a week or so.    Josph Macho, MD 11/25/202410:37 AM

## 2023-04-19 NOTE — Patient Instructions (Signed)
[facility] - A DEPT OF [facility]  Discharge Instructions: Thank you for choosing [facility] to provide your oncology and hematology care.   If you have a lab appointment with the Cancer Center, please go directly to the Cancer Center and check in at the registration area.  Wear comfortable clothing and clothing appropriate for easy access to any Portacath or PICC line.   We strive to give you quality time with your provider. You may need to reschedule your appointment if you arrive late (15 or more minutes).  Arriving late affects you and other patients whose appointments are after yours.  Also, if you miss three or more appointments without notifying the office, you may be dismissed from the clinic at the provider's discretion.      For prescription refill requests, have your pharmacy contact our office and allow 72 hours for refills to be completed.    Today you received the following chemotherapy and/or immunotherapy agents darzalex       To help prevent nausea and vomiting after your treatment, we encourage you to take your nausea medication as directed.  BELOW ARE SYMPTOMS THAT SHOULD BE REPORTED IMMEDIATELY: *FEVER GREATER THAN 100.4 F (38 C) OR HIGHER *CHILLS OR SWEATING *NAUSEA AND VOMITING THAT IS NOT CONTROLLED WITH YOUR NAUSEA MEDICATION *UNUSUAL SHORTNESS OF BREATH *UNUSUAL BRUISING OR BLEEDING *URINARY PROBLEMS (pain or burning when urinating, or frequent urination) *BOWEL PROBLEMS (unusual diarrhea, constipation, pain near the anus) TENDERNESS IN MOUTH AND THROAT WITH OR WITHOUT PRESENCE OF ULCERS (sore throat, sores in mouth, or a toothache) UNUSUAL RASH, SWELLING OR PAIN  UNUSUAL VAGINAL DISCHARGE OR ITCHING   Items with * indicate a potential emergency and should be followed up as soon as possible or go to the Emergency Department if any problems should occur.  Please show the CHEMOTHERAPY ALERT CARD or IMMUNOTHERAPY  ALERT CARD at check-in to the Emergency Department and triage nurse. Should you have questions after your visit or need to cancel or reschedule your appointment, please contact [facility] - A DEPT OF [facility]  [phone] and follow the prompts.  Office hours are 8:00 a.m. to 4:30 p.m. Monday - Friday. Please note that voicemails left after 4:00 p.m. may not be returned until the following business day.  We are closed weekends and major holidays. You have access to a nurse at all times for urgent questions. Please call the main number to the clinic [phone] and follow the prompts.  For any non-urgent questions, you may also contact your provider using MyChart. We now offer e-Visits for anyone 65 and older to request care online for non-urgent symptoms. For details visit [email or web address].   Also download the MyChart app! Go to the app store, search "MyChart", open the app, select [place], and log in with your MyChart username and password.

## 2023-04-20 ENCOUNTER — Other Ambulatory Visit: Payer: Self-pay

## 2023-04-20 LAB — KAPPA/LAMBDA LIGHT CHAINS
Kappa free light chain: 16.2 mg/L (ref 3.3–19.4)
Kappa, lambda light chain ratio: 1.47 (ref 0.26–1.65)
Lambda free light chains: 11 mg/L (ref 5.7–26.3)

## 2023-04-20 LAB — IGG, IGA, IGM
IgA: 30 mg/dL — ABNORMAL LOW (ref 61–437)
IgG (Immunoglobin G), Serum: 590 mg/dL — ABNORMAL LOW (ref 603–1613)
IgM (Immunoglobulin M), Srm: 62 mg/dL (ref 15–143)

## 2023-04-28 LAB — PROTEIN ELECTROPHORESIS, SERUM, WITH REFLEX
A/G Ratio: 1.5 (ref 0.7–1.7)
Albumin ELP: 3.6 g/dL (ref 2.9–4.4)
Alpha-1-Globulin: 0.2 g/dL (ref 0.0–0.4)
Alpha-2-Globulin: 0.7 g/dL (ref 0.4–1.0)
Beta Globulin: 0.9 g/dL (ref 0.7–1.3)
Gamma Globulin: 0.5 g/dL (ref 0.4–1.8)
Globulin, Total: 2.4 g/dL (ref 2.2–3.9)
M-Spike, %: 0.2 g/dL — ABNORMAL HIGH
SPEP Interpretation: 0
Total Protein ELP: 6 g/dL (ref 6.0–8.5)

## 2023-04-28 LAB — IMMUNOFIXATION REFLEX, SERUM
IgA: 33 mg/dL — ABNORMAL LOW (ref 61–437)
IgG (Immunoglobin G), Serum: 662 mg/dL (ref 603–1613)
IgM (Immunoglobulin M), Srm: 68 mg/dL (ref 15–143)

## 2023-05-05 ENCOUNTER — Telehealth: Payer: Self-pay | Admitting: Orthopedic Surgery

## 2023-05-05 ENCOUNTER — Other Ambulatory Visit: Payer: Self-pay | Admitting: Hematology & Oncology

## 2023-05-05 NOTE — Telephone Encounter (Signed)
Called pt to RS appt Dr. Christell Constant will be in surgery still needs to be seen another date called Mobile number on file LVM then called home number on file and the number was disconnected

## 2023-05-06 ENCOUNTER — Other Ambulatory Visit: Payer: Self-pay

## 2023-05-16 ENCOUNTER — Other Ambulatory Visit: Payer: Self-pay

## 2023-05-17 ENCOUNTER — Ambulatory Visit: Payer: Medicare Other | Admitting: Orthopedic Surgery

## 2023-05-20 ENCOUNTER — Inpatient Hospital Stay: Payer: Medicare Other

## 2023-05-20 ENCOUNTER — Ambulatory Visit: Payer: Medicare Other | Admitting: Hematology & Oncology

## 2023-05-20 ENCOUNTER — Ambulatory Visit: Payer: Medicare Other

## 2023-05-20 ENCOUNTER — Other Ambulatory Visit: Payer: Medicare Other

## 2023-06-01 ENCOUNTER — Inpatient Hospital Stay: Payer: Medicare Other | Attending: Hematology & Oncology

## 2023-06-01 ENCOUNTER — Inpatient Hospital Stay: Payer: Medicare Other

## 2023-06-01 ENCOUNTER — Encounter: Payer: Self-pay | Admitting: Hematology & Oncology

## 2023-06-01 ENCOUNTER — Inpatient Hospital Stay (HOSPITAL_BASED_OUTPATIENT_CLINIC_OR_DEPARTMENT_OTHER): Payer: Medicare Other | Admitting: Hematology & Oncology

## 2023-06-01 VITALS — BP 183/85 | HR 70 | Temp 97.8°F | Resp 18 | Wt 208.2 lb

## 2023-06-01 VITALS — BP 171/88 | HR 64 | Resp 17

## 2023-06-01 DIAGNOSIS — Z79620 Long term (current) use of immunosuppressive biologic: Secondary | ICD-10-CM | POA: Insufficient documentation

## 2023-06-01 DIAGNOSIS — Z5112 Encounter for antineoplastic immunotherapy: Secondary | ICD-10-CM | POA: Diagnosis present

## 2023-06-01 DIAGNOSIS — C9000 Multiple myeloma not having achieved remission: Secondary | ICD-10-CM

## 2023-06-01 DIAGNOSIS — C9002 Multiple myeloma in relapse: Secondary | ICD-10-CM | POA: Diagnosis present

## 2023-06-01 LAB — CBC WITH DIFFERENTIAL (CANCER CENTER ONLY)
Abs Immature Granulocytes: 0.03 10*3/uL (ref 0.00–0.07)
Basophils Absolute: 0 10*3/uL (ref 0.0–0.1)
Basophils Relative: 0 %
Eosinophils Absolute: 0.1 10*3/uL (ref 0.0–0.5)
Eosinophils Relative: 2 %
HCT: 33.5 % — ABNORMAL LOW (ref 39.0–52.0)
Hemoglobin: 10.9 g/dL — ABNORMAL LOW (ref 13.0–17.0)
Immature Granulocytes: 0 %
Lymphocytes Relative: 37 %
Lymphs Abs: 2.8 10*3/uL (ref 0.7–4.0)
MCH: 30.4 pg (ref 26.0–34.0)
MCHC: 32.5 g/dL (ref 30.0–36.0)
MCV: 93.6 fL (ref 80.0–100.0)
Monocytes Absolute: 0.5 10*3/uL (ref 0.1–1.0)
Monocytes Relative: 7 %
Neutro Abs: 4 10*3/uL (ref 1.7–7.7)
Neutrophils Relative %: 54 %
Platelet Count: 205 10*3/uL (ref 150–400)
RBC: 3.58 MIL/uL — ABNORMAL LOW (ref 4.22–5.81)
RDW: 13.5 % (ref 11.5–15.5)
WBC Count: 7.4 10*3/uL (ref 4.0–10.5)
nRBC: 0 % (ref 0.0–0.2)

## 2023-06-01 LAB — CMP (CANCER CENTER ONLY)
ALT: 11 U/L (ref 0–44)
AST: 15 U/L (ref 15–41)
Albumin: 4.2 g/dL (ref 3.5–5.0)
Alkaline Phosphatase: 52 U/L (ref 38–126)
Anion gap: 8 (ref 5–15)
BUN: 28 mg/dL — ABNORMAL HIGH (ref 8–23)
CO2: 28 mmol/L (ref 22–32)
Calcium: 9.9 mg/dL (ref 8.9–10.3)
Chloride: 103 mmol/L (ref 98–111)
Creatinine: 1.48 mg/dL — ABNORMAL HIGH (ref 0.61–1.24)
GFR, Estimated: 46 mL/min — ABNORMAL LOW (ref >60)
Glucose, Bld: 111 mg/dL — ABNORMAL HIGH (ref 70–99)
Potassium: 3.8 mmol/L (ref 3.5–5.1)
Sodium: 139 mmol/L (ref 135–145)
Total Bilirubin: 0.9 mg/dL (ref 0.0–1.2)
Total Protein: 6.4 g/dL — ABNORMAL LOW (ref 6.5–8.1)

## 2023-06-01 LAB — LACTATE DEHYDROGENASE: LDH: 207 U/L — ABNORMAL HIGH (ref 98–192)

## 2023-06-01 MED ORDER — SODIUM CHLORIDE 0.9% FLUSH
10.0000 mL | Freq: Once | INTRAVENOUS | Status: AC
  Administered 2023-06-01: 10 mL via INTRAVENOUS

## 2023-06-01 MED ORDER — DEXAMETHASONE 4 MG PO TABS
20.0000 mg | ORAL_TABLET | Freq: Once | ORAL | Status: AC
Start: 2023-06-01 — End: 2023-06-01
  Administered 2023-06-01: 20 mg via ORAL
  Filled 2023-06-01: qty 5

## 2023-06-01 MED ORDER — DARATUMUMAB-HYALURONIDASE-FIHJ 1800-30000 MG-UT/15ML ~~LOC~~ SOLN
1800.0000 mg | Freq: Once | SUBCUTANEOUS | Status: AC
  Administered 2023-06-01: 1800 mg via SUBCUTANEOUS
  Filled 2023-06-01: qty 15

## 2023-06-01 MED ORDER — ACETAMINOPHEN 325 MG PO TABS
650.0000 mg | ORAL_TABLET | Freq: Once | ORAL | Status: AC
  Administered 2023-06-01: 650 mg via ORAL
  Filled 2023-06-01: qty 2

## 2023-06-01 MED ORDER — HEPARIN SOD (PORK) LOCK FLUSH 100 UNIT/ML IV SOLN
500.0000 [IU] | Freq: Once | INTRAVENOUS | Status: AC
Start: 2023-06-01 — End: 2023-06-01
  Administered 2023-06-01: 500 [IU] via INTRAVENOUS

## 2023-06-01 MED ORDER — DIPHENHYDRAMINE HCL 25 MG PO CAPS
50.0000 mg | ORAL_CAPSULE | Freq: Once | ORAL | Status: AC
  Administered 2023-06-01: 50 mg via ORAL
  Filled 2023-06-01: qty 2

## 2023-06-01 NOTE — Progress Notes (Signed)
 Hematology and Oncology Follow Up Visit  Robert [name] [id number] 1933/07/19 89 y.o. 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  He is still dealing with the left leg.  He has had 2 or 3 different opinions.  He does have a bulging disc at L5-S1.  He has foot drop in the left foot.  Again, he does not want to have surgery.  He has finished his physical therapy.  I think he is going do some exercises at home.  I think that he sees his neurologist/neurosurgeon this week.  As far as the myeloma goes, there was a monoclonal spike in his blood last time.  His monoclonal spike was 0.2 g/dL.  His IgG level was about 530 mg/dL.  The Kappa light chain was 1.6 mg/dL.  He has had no problems with appetite.  His blood pressure is still on the high side.  He is not sure what blood pressure medicines he actually takes.  He said that over the [place], he was not all that compliant with his blood pressure medication.  He has had no fever.  He has had no cough or shortness of breath.  He has had no change in bowel or bladder habits.  Overall, I would say his performance status is probably ECOG 1.    Medications:  Allergies as of 06/01/2023   No Known Allergies      Medication List        Accurate as of June 01, 2023 12:37 PM. If you have any questions, ask your nurse or doctor.          acetaminophen  650 MG CR tablet Commonly known as: TYLENOL  Take 650 mg by mouth  every 8 (eight) hours as needed for pain.   acetaminophen -codeine  300-30 MG tablet Commonly known as: TYLENOL  #3 Take 1 tablet by mouth every 8 (eight) hours as needed for moderate pain.   aspirin  81 MG tablet Take 81 mg by mouth daily.   benzonatate  100 MG capsule Commonly known as: TESSALON  Take 1 capsule (100 mg total) by mouth 3 (three) times daily as needed for cough.   bimatoprost  0.01 % Soln Commonly known as: LUMIGAN  Place 1 drop into both eyes at bedtime.   CALTRATE 600+D PLUS PO Take 1 tablet by mouth daily.   carvedilol  25 MG tablet Commonly known as: COREG  Take 25 mg by mouth 2 (two) times daily.   cloNIDine  0.1 MG tablet Commonly known as: CATAPRES  Take 0.1 mg by mouth 2 (two) times daily as needed (Elevated BP).   cyclobenzaprine  10 MG tablet Commonly known as: FLEXERIL  Take 1 tablet (10 mg total) by mouth 2 (two) times daily as needed (back pain, muscle spasms).   dexamethasone  4 MG tablet Commonly known as: DECADRON  Take 5 tablets prior to immunotherapy treatment What changed:  how much to take how to take this when to  take this   diclofenac  Sodium 1 % Gel Commonly known as: VOLTAREN  Apply 2 g topically 4 (four) times daily. As needed   famciclovir  250 MG tablet Commonly known as: FAMVIR  TAKE 1 TABLET DAILY   Flonase  Allergy Relief 50 MCG/ACT nasal spray Generic drug: fluticasone  Place 1 spray into both nostrils daily.   losartan  100 MG tablet Commonly known as: COZAAR  Take 100 mg by mouth daily.   meloxicam 15 MG tablet Commonly known as: MOBIC Take 1 tablet by mouth daily as needed for pain.   metolazone  5 MG tablet Commonly known as: ZAROXOLYN  TAKE 1 TABLET 1 HOUR PRIOR TO TAKING LASIX  DAILY   montelukast  10 MG tablet Commonly known as: SINGULAIR  TAKE 1 TABLET AT BEDTIME   potassium chloride  SA 20 MEQ tablet Commonly known as: KLOR-CON  M Take 1 tablet (20 mEq total) by mouth daily.   rosuvastatin 20 MG tablet Commonly known  as: CRESTOR Take 20 mg by mouth at bedtime.   SYSTANE OP Place 1 drop into both eyes daily as needed (dry eyes).   Zoledronic  Acid 4 MG/100ML IVPB Commonly known as: ZOMETA  Inject 4 mg into the vein every 30 (thirty) days. 07/03/2022 Every 4 months.        Allergies: No Known Allergies  Past Medical History, Surgical history, Social history, and Family History were reviewed and updated.  Review of Systems: Review of Systems  Constitutional: Negative.   HENT: Negative.    Eyes: Negative.   Respiratory: Negative.    Cardiovascular: Negative.   Gastrointestinal: Negative.   Genitourinary: Negative.   Musculoskeletal:  Positive for joint pain.  Skin: Negative.   Neurological: Negative.   Endo/Heme/Allergies: Negative.   Psychiatric/Behavioral: Negative.       Physical Exam:  weight is 208 lb 4 oz (94.5 kg). His oral temperature is 97.8 F (36.6 C). His blood pressure is 183/85 (abnormal) and his pulse is 70. His respiration is 18 and oxygen saturation is 100%.   Wt Readings from Last 3 Encounters:  06/01/23 208 lb 4 oz (94.5 kg)  04/19/23 208 lb (94.3 kg)  03/22/23 209 lb (94.8 kg)   Physical Exam Vitals reviewed.  HENT:     Head: Normocephalic and atraumatic.  Eyes:     Pupils: Pupils are equal, round, and reactive to light.  Cardiovascular:     Rate and Rhythm: Normal rate and regular rhythm.     Heart sounds: Normal heart sounds.  Pulmonary:     Effort: Pulmonary effort is normal.     Breath sounds: Normal breath sounds.  Abdominal:     General: Bowel sounds are normal.     Palpations: Abdomen is soft.  Musculoskeletal:        General: No tenderness or deformity. Normal range of motion.     Cervical back: Normal range of motion.  Lymphadenopathy:     Cervical: No cervical adenopathy.  Skin:    General: Skin is warm and dry.     Findings: No erythema or rash.  Neurological:     Mental Status: He is alert and oriented to person, place, and time.      Comments: On his neurological exam, he does have decreased function in the left foot with dorsiflexion.  Right foot is unremarkable.  He has good pulses.  He has no tenderness to palpation over the left lower leg.  Psychiatric:        Behavior: Behavior normal.        Thought Content: Thought content  normal.        Judgment: Judgment normal.      Lab Results  Component Value Date   WBC 7.4 06/01/2023   HGB 10.9 (L) 06/01/2023   HCT 33.5 (L) 06/01/2023   MCV 93.6 06/01/2023   PLT 205 06/01/2023   Lab Results  Component Value Date   FERRITIN 555 (H) 07/23/2021   IRON 84 07/23/2021   TIBC 325 07/23/2021   UIBC 241 07/23/2021   IRONPCTSAT 26 07/23/2021   Lab Results  Component Value Date   RETICCTPCT 2.2 04/05/2019   RBC 3.58 (L) 06/01/2023   Lab Results  Component Value Date   KPAFRELGTCHN 16.2 04/19/2023   LAMBDASER 11.0 04/19/2023   KAPLAMBRATIO 1.47 04/19/2023   Lab Results  Component Value Date   IGGSERUM 590 (L) 04/19/2023   IGGSERUM 662 04/19/2023   IGA 30 (L) 04/19/2023   IGA 33 (L) 04/19/2023   IGMSERUM 62 04/19/2023   IGMSERUM 68 04/19/2023   Lab Results  Component Value Date   TOTALPROTELP 6.0 04/19/2023   ALBUMINELP 3.6 04/19/2023   A1GS 0.2 04/19/2023   A2GS 0.7 04/19/2023   BETS 0.9 04/19/2023   BETA2SER 0.3 01/03/2015   GAMS 0.5 04/19/2023   MSPIKE 0.2 (H) 04/19/2023   SPEI Comment 11/03/2022     Chemistry      Component Value Date/Time   NA 139 06/01/2023 1142   NA 137 04/08/2017 1141   NA 138 10/29/2015 1029   K 3.8 06/01/2023 1142   K 3.4 04/08/2017 1141   K 3.9 10/29/2015 1029   CL 103 06/01/2023 1142   CL 102 04/08/2017 1141   CO2 28 06/01/2023 1142   CO2 30 04/08/2017 1141   CO2 27 10/29/2015 1029   BUN 28 (H) 06/01/2023 1142   BUN 16 04/08/2017 1141   BUN 13.8 10/29/2015 1029   CREATININE 1.48 (H) 06/01/2023 1142   CREATININE 1.3 (H) 04/08/2017 1141   CREATININE 1.1 10/29/2015 1029      Component Value Date/Time    CALCIUM 9.9 06/01/2023 1142   CALCIUM 9.3 04/08/2017 1141   CALCIUM 9.3 10/29/2015 1029   ALKPHOS 52 06/01/2023 1142   ALKPHOS 66 04/08/2017 1141   ALKPHOS 52 10/29/2015 1029   AST 15 06/01/2023 1142   AST 20 10/29/2015 1029   ALT 11 06/01/2023 1142   ALT 21 04/08/2017 1141   ALT 14 10/29/2015 1029   BILITOT 0.9 06/01/2023 1142   BILITOT 0.75 10/29/2015 1029       Impression and Plan: Robert [name] is a very pleasant 89 yo African American gentleman with relapsed IgG kappa myeloma. He has history of stem cell transplant in 2006 and since relapsed.    The fact that he had a monoclonal spike in his blood, the last time that we saw him, is a little bit troublesome.  It will be interesting to see what his level is today.  If the monoclonal spike is higher, then I think we are to have to probably do another bone marrow test on him.  It probably has been a while since he has had a bone marrow test done.  I really hope that 2025 will be a quite year for him.  I hope that his left leg gets taken care of.  I will plan to see him back in a month.      Maude JONELLE Crease, MD 1/7/202512:37 PM

## 2023-06-01 NOTE — Patient Instructions (Signed)
 [facility] - A DEPT OF [facility]  Discharge Instructions: Thank you for choosing [facility] to provide your oncology and hematology care.   If you have a lab appointment with the Cancer Center, please go directly to the Cancer Center and check in at the registration area.  Wear comfortable clothing and clothing appropriate for easy access to any Portacath or PICC line.   We strive to give you quality time with your provider. You may need to reschedule your appointment if you arrive late (15 or more minutes).  Arriving late affects you and other patients whose appointments are after yours.  Also, if you miss three or more appointments without notifying the office, you may be dismissed from the clinic at the provider's discretion.      For prescription refill requests, have your pharmacy contact our office and allow 72 hours for refills to be completed.    Today you received the following chemotherapy and/or immunotherapy agents Darzalex       To help prevent nausea and vomiting after your treatment, we encourage you to take your nausea medication as directed.  BELOW ARE SYMPTOMS THAT SHOULD BE REPORTED IMMEDIATELY: *FEVER GREATER THAN 100.4 F (38 C) OR HIGHER *CHILLS OR SWEATING *NAUSEA AND VOMITING THAT IS NOT CONTROLLED WITH YOUR NAUSEA MEDICATION *UNUSUAL SHORTNESS OF BREATH *UNUSUAL BRUISING OR BLEEDING *URINARY PROBLEMS (pain or burning when urinating, or frequent urination) *BOWEL PROBLEMS (unusual diarrhea, constipation, pain near the anus) TENDERNESS IN MOUTH AND THROAT WITH OR WITHOUT PRESENCE OF ULCERS (sore throat, sores in mouth, or a toothache) UNUSUAL RASH, SWELLING OR PAIN  UNUSUAL VAGINAL DISCHARGE OR ITCHING   Items with * indicate a potential emergency and should be followed up as soon as possible or go to the Emergency Department if any problems should occur.  Please show the CHEMOTHERAPY ALERT CARD or IMMUNOTHERAPY  ALERT CARD at check-in to the Emergency Department and triage nurse. Should you have questions after your visit or need to cancel or reschedule your appointment, please contact [facility] - A DEPT OF [facility]  [phone] and follow the prompts.  Office hours are 8:00 a.m. to 4:30 p.m. Monday - Friday. Please note that voicemails left after 4:00 p.m. may not be returned until the following business day.  We are closed weekends and major holidays. You have access to a nurse at all times for urgent questions. Please call the main number to the clinic [phone] and follow the prompts.  For any non-urgent questions, you may also contact your provider using MyChart. We now offer e-Visits for anyone 11 and older to request care online for non-urgent symptoms. For details visit [email or web address].   Also download the MyChart app! Go to the app store, search MyChart, open the app, select , and log in with your MyChart username and password.

## 2023-06-01 NOTE — Patient Instructions (Signed)

## 2023-06-03 ENCOUNTER — Other Ambulatory Visit: Payer: Self-pay

## 2023-06-03 LAB — KAPPA/LAMBDA LIGHT CHAINS
Kappa free light chain: 15.9 mg/L (ref 3.3–19.4)
Kappa, lambda light chain ratio: 1.77 — ABNORMAL HIGH (ref 0.26–1.65)
Lambda free light chains: 9 mg/L (ref 5.7–26.3)

## 2023-06-03 LAB — IGG, IGA, IGM
IgA: 34 mg/dL — ABNORMAL LOW (ref 61–437)
IgG (Immunoglobin G), Serum: 645 mg/dL (ref 603–1613)
IgM (Immunoglobulin M), Srm: 65 mg/dL (ref 15–143)

## 2023-06-04 ENCOUNTER — Ambulatory Visit: Payer: Medicare Other | Admitting: Orthopedic Surgery

## 2023-06-07 ENCOUNTER — Encounter: Payer: Self-pay | Admitting: *Deleted

## 2023-06-07 LAB — PROTEIN ELECTROPHORESIS, SERUM, WITH REFLEX
A/G Ratio: 1.5 (ref 0.7–1.7)
Albumin ELP: 3.7 g/dL (ref 2.9–4.4)
Alpha-1-Globulin: 0.2 g/dL (ref 0.0–0.4)
Alpha-2-Globulin: 0.9 g/dL (ref 0.4–1.0)
Beta Globulin: 0.8 g/dL (ref 0.7–1.3)
Gamma Globulin: 0.5 g/dL (ref 0.4–1.8)
Globulin, Total: 2.4 g/dL (ref 2.2–3.9)
Total Protein ELP: 6.1 g/dL (ref 6.0–8.5)

## 2023-06-14 ENCOUNTER — Ambulatory Visit (INDEPENDENT_AMBULATORY_CARE_PROVIDER_SITE_OTHER): Payer: Medicare Other | Admitting: Orthopedic Surgery

## 2023-06-14 DIAGNOSIS — M21372 Foot drop, left foot: Secondary | ICD-10-CM | POA: Diagnosis not present

## 2023-06-14 NOTE — Progress Notes (Signed)
Orthopedic Spine Surgery Office Note   Assessment: Patient is a 89 y.o. 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  Given that he is not having any pain and he has now had a foot drop for 6 months, I told him that I am not sure that surgical decompression would improve his foot drop.  With the potential risk for complication that explained, patient elected to continue with non-operative treatment -I recommended he continue with the foot drop brace but explained that he could purchase a [place] brace which would be more comfortable but not as supportive -Patient should return to office on an as-needed basis     Patient expressed understanding of the plan and all questions were answered to the patient's satisfaction.    ___________________________________________________________________________     History:   Patient is a 89 y.o. male who presents today for follow-up on his lumbar spine.  Patient is not having any low back or radiating leg pain.  He still has a foot drop on the left side.  He said he has not noticed any improvement in his strength.  He has not noticed any new weakness.  He is able to ambulate with foot drop brace.  He is using a cane to help with ambulation.  He has not had any change in his symptoms since he was last seen in the office.     Treatments tried: AFO, cane use, PT     Physical Exam:   General: no acute distress, appears stated age Neurologic: alert, answering questions appropriately, following commands Respiratory: unlabored breathing on room air, symmetric chest rise Psychiatric: appropriate affect, normal cadence to speech     MSK (spine):   -Strength exam                                                   Left                  Right EHL                              1/5                  1/5 TA                                 1/5                  4/5 GSC                             5/5                   5/5 Knee extension            5/5                  5/5 Hip flexion                    5/5                  5/5   -  Sensory exam                           Sensation intact to light touch in L3-S1 nerve distributions of bilateral lower extremities   -Achilles DTR: 1/4 on the left, 1/4 on the right -Patellar tendon DTR: 1/4 on the left, 1/4 on the right    Imaging: XRs of the lumbar spine from 01/14/2023 were previously independently reviewed and interpreted, showing lumbar 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 Disc height loss seen at all lumbar levels. Anterior osteophyte formation noted at L2/3. No fracture or dislocation seen. No evidence of instability on flexion/extension views.    MRI of the lumbar spine from 11/13/2022 was previously independently reviewed and interpreted, showing small synovial cyst on the left at L1/2. There is central stenosis at that level as well. Central stenosis L3/4. Central stenosis and lateral recess stenosis at L4/5 with a  synovial cyst on the right at L4/5. Foraminal stenosis on the left at L2/3. Disc herniation with central stenosis at T12/L1.      Patient name: Robert [name] Patient MRN: [id number] Date of visit: 06/14/23

## 2023-06-29 ENCOUNTER — Inpatient Hospital Stay: Payer: Medicare Other

## 2023-06-29 ENCOUNTER — Other Ambulatory Visit: Payer: Self-pay

## 2023-06-29 ENCOUNTER — Inpatient Hospital Stay: Payer: Medicare Other | Attending: Hematology & Oncology

## 2023-06-29 ENCOUNTER — Encounter: Payer: Self-pay | Admitting: Hematology & Oncology

## 2023-06-29 ENCOUNTER — Inpatient Hospital Stay (HOSPITAL_BASED_OUTPATIENT_CLINIC_OR_DEPARTMENT_OTHER): Payer: Medicare Other | Admitting: Hematology & Oncology

## 2023-06-29 VITALS — BP 158/83 | HR 67 | Temp 98.2°F | Resp 18 | Ht 71.0 in | Wt 210.0 lb

## 2023-06-29 VITALS — BP 158/76 | HR 59

## 2023-06-29 DIAGNOSIS — Z79620 Long term (current) use of immunosuppressive biologic: Secondary | ICD-10-CM | POA: Diagnosis not present

## 2023-06-29 DIAGNOSIS — M899 Disorder of bone, unspecified: Secondary | ICD-10-CM

## 2023-06-29 DIAGNOSIS — C9000 Multiple myeloma not having achieved remission: Secondary | ICD-10-CM

## 2023-06-29 DIAGNOSIS — C9002 Multiple myeloma in relapse: Secondary | ICD-10-CM | POA: Insufficient documentation

## 2023-06-29 DIAGNOSIS — D631 Anemia in chronic kidney disease: Secondary | ICD-10-CM

## 2023-06-29 DIAGNOSIS — Z5112 Encounter for antineoplastic immunotherapy: Secondary | ICD-10-CM | POA: Diagnosis present

## 2023-06-29 DIAGNOSIS — M898X9 Other specified disorders of bone, unspecified site: Secondary | ICD-10-CM

## 2023-06-29 LAB — CMP (CANCER CENTER ONLY)
ALT: 11 U/L (ref 0–44)
AST: 15 U/L (ref 15–41)
Albumin: 4 g/dL (ref 3.5–5.0)
Alkaline Phosphatase: 46 U/L (ref 38–126)
Anion gap: 9 (ref 5–15)
BUN: 26 mg/dL — ABNORMAL HIGH (ref 8–23)
CO2: 28 mmol/L (ref 22–32)
Calcium: 9.8 mg/dL (ref 8.9–10.3)
Chloride: 103 mmol/L (ref 98–111)
Creatinine: 1.22 mg/dL (ref 0.61–1.24)
GFR, Estimated: 57 mL/min — ABNORMAL LOW (ref >60)
Glucose, Bld: 93 mg/dL (ref 70–99)
Potassium: 3.6 mmol/L (ref 3.5–5.1)
Sodium: 140 mmol/L (ref 135–145)
Total Bilirubin: 0.7 mg/dL (ref 0.0–1.2)
Total Protein: 6.3 g/dL — ABNORMAL LOW (ref 6.5–8.1)

## 2023-06-29 LAB — CBC WITH DIFFERENTIAL (CANCER CENTER ONLY)
Abs Immature Granulocytes: 0.01 10*3/uL (ref 0.00–0.07)
Basophils Absolute: 0 10*3/uL (ref 0.0–0.1)
Basophils Relative: 0 %
Eosinophils Absolute: 0.2 10*3/uL (ref 0.0–0.5)
Eosinophils Relative: 2 %
HCT: 33.4 % — ABNORMAL LOW (ref 39.0–52.0)
Hemoglobin: 10.9 g/dL — ABNORMAL LOW (ref 13.0–17.0)
Immature Granulocytes: 0 %
Lymphocytes Relative: 41 %
Lymphs Abs: 2.7 10*3/uL (ref 0.7–4.0)
MCH: 30.7 pg (ref 26.0–34.0)
MCHC: 32.6 g/dL (ref 30.0–36.0)
MCV: 94.1 fL (ref 80.0–100.0)
Monocytes Absolute: 0.6 10*3/uL (ref 0.1–1.0)
Monocytes Relative: 8 %
Neutro Abs: 3.3 10*3/uL (ref 1.7–7.7)
Neutrophils Relative %: 49 %
Platelet Count: 186 10*3/uL (ref 150–400)
RBC: 3.55 MIL/uL — ABNORMAL LOW (ref 4.22–5.81)
RDW: 13.6 % (ref 11.5–15.5)
WBC Count: 6.7 10*3/uL (ref 4.0–10.5)
nRBC: 0 % (ref 0.0–0.2)

## 2023-06-29 LAB — LACTATE DEHYDROGENASE: LDH: 196 U/L — ABNORMAL HIGH (ref 98–192)

## 2023-06-29 MED ORDER — DARATUMUMAB-HYALURONIDASE-FIHJ 1800-30000 MG-UT/15ML ~~LOC~~ SOLN
1800.0000 mg | Freq: Once | SUBCUTANEOUS | Status: AC
  Administered 2023-06-29: 1800 mg via SUBCUTANEOUS
  Filled 2023-06-29: qty 15

## 2023-06-29 MED ORDER — DEXAMETHASONE 4 MG PO TABS
20.0000 mg | ORAL_TABLET | Freq: Once | ORAL | Status: AC
  Administered 2023-06-29: 20 mg via ORAL
  Filled 2023-06-29: qty 5

## 2023-06-29 MED ORDER — DIPHENHYDRAMINE HCL 25 MG PO CAPS
50.0000 mg | ORAL_CAPSULE | Freq: Once | ORAL | Status: AC
  Administered 2023-06-29: 50 mg via ORAL
  Filled 2023-06-29: qty 2

## 2023-06-29 MED ORDER — ZOLEDRONIC ACID 4 MG/100ML IV SOLN
4.0000 mg | Freq: Once | INTRAVENOUS | Status: AC
  Administered 2023-06-29: 4 mg via INTRAVENOUS
  Filled 2023-06-29: qty 100

## 2023-06-29 MED ORDER — ACETAMINOPHEN 325 MG PO TABS
650.0000 mg | ORAL_TABLET | Freq: Once | ORAL | Status: AC
  Administered 2023-06-29: 650 mg via ORAL
  Filled 2023-06-29: qty 2

## 2023-06-29 MED ORDER — SODIUM CHLORIDE 0.9 % IV SOLN: Freq: Once | INTRAVENOUS | Status: AC

## 2023-06-29 NOTE — Patient Instructions (Signed)

## 2023-06-29 NOTE — Progress Notes (Signed)
 Hematology and Oncology Follow Up Visit  Robert [name] [id number] 02/25/1934 89 y.o. 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  So far, everything is going pretty well with the myeloma.  He does have the foot drop which is not related to the myeloma.  He does have a herniated disc at L5-S1.  However, the neurosurgeon-Dr. Moore-does not feel that surgery is needed right now.  He still wants to just watch.  As far as his myeloma goes everything is doing well.  The last time that we saw him, there was no monoclonal spike in his blood.  His IgG level was 645 mg/dL.  The kappa light chain was 1.6 mg/dL.  His appetite has been good.  In fact is been too good.  He really needs to lose weight.  I told him that he really needs to get down to about 180 pounds.  I think this will help his back.  He has had no change in bowel or bladder habits.  He has had no cough.  There is been no swollen lymph nodes.  He has had no mouth sores.  Overall, I would say his performance status is probably ECOG 1.  .    Medications:  Allergies as of 06/29/2023   No Known Allergies      Medication List        Accurate as of June 29, 2023 11:06 AM. If you have any questions, ask your nurse or doctor.          STOP taking these medications    acetaminophen -codeine  300-30 MG tablet Commonly known as: TYLENOL  #3 Stopped by: Maude JONELLE Crease       TAKE these medications     acetaminophen  650 MG CR tablet Commonly known as: TYLENOL  Take 650 mg by mouth every 8 (eight) hours as needed for pain.   aspirin  81 MG tablet Take 81 mg by mouth daily.   benzonatate  100 MG capsule Commonly known as: TESSALON  Take 1 capsule (100 mg total) by mouth 3 (three) times daily as needed for cough.   bimatoprost  0.01 % Soln Commonly known as: LUMIGAN  Place 1 drop into both eyes at bedtime.   CALTRATE 600+D PLUS PO Take 1 tablet by mouth daily.   carvedilol  25 MG tablet Commonly known as: COREG  Take 25 mg by mouth 2 (two) times daily.   cloNIDine  0.1 MG tablet Commonly known as: CATAPRES  Take 0.1 mg by mouth 2 (two) times daily as needed (Elevated BP).   cyclobenzaprine  10 MG tablet Commonly known as: FLEXERIL  Take 1 tablet (10 mg total) by mouth 2 (two) times daily as needed (back pain, muscle spasms).   dexamethasone  4 MG tablet Commonly known as: DECADRON  Take 5 tablets prior to immunotherapy treatment What changed:  how much to take how to take this when to take this   diclofenac  Sodium 1 %  Gel Commonly known as: VOLTAREN  Apply 2 g topically 4 (four) times daily. As needed   famciclovir  250 MG tablet Commonly known as: FAMVIR  TAKE 1 TABLET DAILY   Flonase  Allergy Relief 50 MCG/ACT nasal spray Generic drug: fluticasone  Place 1 spray into both nostrils daily.   losartan  100 MG tablet Commonly known as: COZAAR  Take 100 mg by mouth daily.   meloxicam 15 MG tablet Commonly known as: MOBIC Take 1 tablet by mouth daily as needed for pain.   metolazone  5 MG tablet Commonly known as: ZAROXOLYN  TAKE 1 TABLET 1 HOUR PRIOR TO TAKING LASIX  DAILY   montelukast  10 MG tablet Commonly known as: SINGULAIR  TAKE 1 TABLET AT BEDTIME   potassium chloride  SA 20 MEQ tablet Commonly known as: KLOR-CON  M Take 1 tablet (20 mEq total) by mouth daily.   rosuvastatin 20 MG tablet Commonly known as: CRESTOR Take 20 mg by mouth at bedtime.   SYSTANE  OP Place 1 drop into both eyes daily as needed (dry eyes).   Zoledronic  Acid 4 MG/100ML IVPB Commonly known as: ZOMETA  Inject 4 mg into the vein every 30 (thirty) days. 07/03/2022 Every 4 months.        Allergies: No Known Allergies  Past Medical History, Surgical history, Social history, and Family History were reviewed and updated.  Review of Systems: Review of Systems  Constitutional: Negative.   HENT: Negative.    Eyes: Negative.   Respiratory: Negative.    Cardiovascular: Negative.   Gastrointestinal: Negative.   Genitourinary: Negative.   Musculoskeletal:  Positive for joint pain.  Skin: Negative.   Neurological: Negative.   Endo/Heme/Allergies: Negative.   Psychiatric/Behavioral: Negative.       Physical Exam:  height is 5' 11 (1.803 m) and weight is 210 lb (95.3 kg). His oral temperature is 98.2 F (36.8 C). His blood pressure is 158/83 (abnormal) and his pulse is 67. His respiration is 18 and oxygen saturation is 99%.   Wt Readings from Last 3 Encounters:  06/29/23 210 lb (95.3 kg)  06/01/23 208 lb 4 oz (94.5 kg)  04/19/23 208 lb (94.3 kg)   Physical Exam Vitals reviewed.  HENT:     Head: Normocephalic and atraumatic.  Eyes:     Pupils: Pupils are equal, round, and reactive to light.  Cardiovascular:     Rate and Rhythm: Normal rate and regular rhythm.     Heart sounds: Normal heart sounds.  Pulmonary:     Effort: Pulmonary effort is normal.     Breath sounds: Normal breath sounds.  Abdominal:     General: Bowel sounds are normal.     Palpations: Abdomen is soft.  Musculoskeletal:        General: No tenderness or deformity. Normal range of motion.     Cervical back: Normal range of motion.  Lymphadenopathy:     Cervical: No cervical adenopathy.  Skin:    General: Skin is warm and dry.     Findings: No erythema or rash.  Neurological:     Mental Status: He is alert and oriented to person, place, and time.     Comments: On his neurological exam,  he does have decreased function in the left foot with dorsiflexion.  Right foot is unremarkable.  He has good pulses.  He has no tenderness to palpation over the left lower leg.  Psychiatric:        Behavior: Behavior normal.        Thought Content: Thought content normal.  Judgment: Judgment normal.      Lab Results  Component Value Date   WBC 6.7 06/29/2023   HGB 10.9 (L) 06/29/2023   HCT 33.4 (L) 06/29/2023   MCV 94.1 06/29/2023   PLT 186 06/29/2023   Lab Results  Component Value Date   FERRITIN 555 (H) 07/23/2021   IRON 84 07/23/2021   TIBC 325 07/23/2021   UIBC 241 07/23/2021   IRONPCTSAT 26 07/23/2021   Lab Results  Component Value Date   RETICCTPCT 2.2 04/05/2019   RBC 3.55 (L) 06/29/2023   Lab Results  Component Value Date   KPAFRELGTCHN 15.9 06/01/2023   LAMBDASER 9.0 06/01/2023   KAPLAMBRATIO 1.77 (H) 06/01/2023   Lab Results  Component Value Date   IGGSERUM 645 06/01/2023   IGA 34 (L) 06/01/2023   IGMSERUM 65 06/01/2023   Lab Results  Component Value Date   TOTALPROTELP 6.1 06/01/2023   ALBUMINELP 3.7 06/01/2023   A1GS 0.2 06/01/2023   A2GS 0.9 06/01/2023   BETS 0.8 06/01/2023   BETA2SER 0.3 01/03/2015   GAMS 0.5 06/01/2023   MSPIKE Not Observed 06/01/2023   SPEI Comment 11/03/2022     Chemistry      Component Value Date/Time   NA 140 06/29/2023 1020   NA 137 04/08/2017 1141   NA 138 10/29/2015 1029   K 3.6 06/29/2023 1020   K 3.4 04/08/2017 1141   K 3.9 10/29/2015 1029   CL 103 06/29/2023 1020   CL 102 04/08/2017 1141   CO2 28 06/29/2023 1020   CO2 30 04/08/2017 1141   CO2 27 10/29/2015 1029   BUN 26 (H) 06/29/2023 1020   BUN 16 04/08/2017 1141   BUN 13.8 10/29/2015 1029   CREATININE 1.22 06/29/2023 1020   CREATININE 1.3 (H) 04/08/2017 1141   CREATININE 1.1 10/29/2015 1029      Component Value Date/Time   CALCIUM 9.8 06/29/2023 1020   CALCIUM 9.3 04/08/2017 1141   CALCIUM 9.3 10/29/2015 1029   ALKPHOS 46 06/29/2023 1020    ALKPHOS 66 04/08/2017 1141   ALKPHOS 52 10/29/2015 1029   AST 15 06/29/2023 1020   AST 20 10/29/2015 1029   ALT 11 06/29/2023 1020   ALT 21 04/08/2017 1141   ALT 14 10/29/2015 1029   BILITOT 0.7 06/29/2023 1020   BILITOT 0.75 10/29/2015 1029       Impression and Plan: Robert [name] is a very pleasant 89 yo African American gentleman with relapsed IgG kappa myeloma. He has history of stem cell transplant in 2006 and since relapsed.    So far, things continue to do well for him.  Again, if he needs any intervention for this foot drop, I do not see a problem with this from a perspective of myeloma.  He will get his Zometa  today.  He will continue on the Faspro.  He has done incredibly well on this.  We will plan to get back in 1 month.  Maude JONELLE Crease, MD 2/4/202511:06 AM

## 2023-06-29 NOTE — Patient Instructions (Signed)
 [facility] - A DE[facility] OF [facility]  Discharge Instructions: Thank you for choosing [facility] to provide your oncology and hematology care.   If you have a lab appointment with the Cancer Center, please go directly to the Cancer Center and check in at the registration area.  Wear comfortable clothing and clothing appropriate for easy access to any Portacath or PICC line.   We strive to give you quality time with your provider. You may need to reschedule your appointment if you arrive late (15 or more minutes).  Arriving late affects you and other patients whose appointments are after yours.  Also, if you miss three or more appointments without notifying the office, you may be dismissed from the clinic at the provider's discretion.      For prescription refill requests, have your pharmacy contact our office and allow 72 hours for refills to be completed.    Today you received the following chemotherapy and/or immunotherapy agents:  Darzalex/Faspro      To help prevent nausea and vomiting after your treatment, we encourage you to take your nausea medication as directed.  BELOW ARE SYMPTOMS THAT SHOULD BE REPORTED IMMEDIATELY: *FEVER GREATER THAN 100.4 F (38 C) OR HIGHER *CHILLS OR SWEATING *NAUSEA AND VOMITING THAT IS NOT CONTROLLED WITH YOUR NAUSEA MEDICATION *UNUSUAL SHORTNESS OF BREATH *UNUSUAL BRUISING OR BLEEDING *URINARY PROBLEMS (pain or burning when urinating, or frequent urination) *BOWEL PROBLEMS (unusual diarrhea, constipation, pain near the anus) TENDERNESS IN MOUTH AND THROAT WITH OR WITHOUT PRESENCE OF ULCERS (sore throat, sores in mouth, or a toothache) UNUSUAL RASH, SWELLING OR PAIN  UNUSUAL VAGINAL DISCHARGE OR ITCHING   Items with * indicate a potential emergency and should be followed up as soon as possible or go to the Emergency Department if any problems should occur.  Please show the CHEMOTHERAPY ALERT CARD or  IMMUNOTHERAPY ALERT CARD at check-in to the Emergency Department and triage nurse. Should you have questions after your visit or need to cancel or reschedule your appointment, please contact [facility] - A DEPT OF [facility]  [phone] and follow the prompts.  Office hours are 8:00 a.m. to 4:30 p.m. Monday - Friday. Please note that voicemails left after 4:00 p.m. may not be returned until the following business day.  We are closed weekends and major holidays. You have access to a nurse at all times for urgent questions. Please call the main number to the clinic [phone] and follow the prompts.  For any non-urgent questions, you may also contact your provider using MyChart. We now offer e-Visits for anyone 39 and older to request care online for non-urgent symptoms. For details visit [email or web address].   Also download the MyChart app! Go to the app store, search "MyChart", open the app, select [place], and log in with your MyChart username and password.

## 2023-06-30 LAB — IGG, IGA, IGM
IgA: 31 mg/dL — ABNORMAL LOW (ref 61–437)
IgG (Immunoglobin G), Serum: 620 mg/dL (ref 603–1613)
IgM (Immunoglobulin M), Srm: 79 mg/dL (ref 15–143)

## 2023-06-30 LAB — KAPPA/LAMBDA LIGHT CHAINS
Kappa free light chain: 15.4 mg/L (ref 3.3–19.4)
Kappa, lambda light chain ratio: 2.05 — ABNORMAL HIGH (ref 0.26–1.65)
Lambda free light chains: 7.5 mg/L (ref 5.7–26.3)

## 2023-07-02 ENCOUNTER — Inpatient Hospital Stay: Payer: Medicare Other

## 2023-07-02 DIAGNOSIS — Z5112 Encounter for antineoplastic immunotherapy: Secondary | ICD-10-CM | POA: Diagnosis not present

## 2023-07-02 DIAGNOSIS — C9000 Multiple myeloma not having achieved remission: Secondary | ICD-10-CM

## 2023-07-05 LAB — PROTEIN ELECTROPHORESIS, SERUM, WITH REFLEX
A/G Ratio: 1.4 (ref 0.7–1.7)
Albumin ELP: 3.4 g/dL (ref 2.9–4.4)
Alpha-1-Globulin: 0.2 g/dL (ref 0.0–0.4)
Alpha-2-Globulin: 1 g/dL (ref 0.4–1.0)
Beta Globulin: 0.7 g/dL (ref 0.7–1.3)
Gamma Globulin: 0.6 g/dL (ref 0.4–1.8)
Globulin, Total: 2.5 g/dL (ref 2.2–3.9)
Total Protein ELP: 5.9 g/dL — ABNORMAL LOW (ref 6.0–8.5)

## 2023-07-07 LAB — UPEP/UIFE/LIGHT CHAINS/TP, 24-HR UR
% BETA, Urine: 0 %
ALPHA 1 URINE: 0 %
Albumin, U: 100 %
Alpha 2, Urine: 0 %
Free Kappa Lt Chains,Ur: 10.77 mg/L (ref 1.17–86.46)
Free Kappa/Lambda Ratio: 4.77 (ref 1.83–14.26)
Free Lambda Lt Chains,Ur: 2.26 mg/L (ref 0.27–15.21)
GAMMA GLOBULIN URINE: 0 %
Total Protein, Urine-Ur/day: 117 mg/(24.h) (ref 30–150)
Total Protein, Urine: 4.6 mg/dL
Total Volume: 2550

## 2023-07-26 ENCOUNTER — Inpatient Hospital Stay: Payer: Medicare Other

## 2023-07-26 ENCOUNTER — Inpatient Hospital Stay: Payer: Medicare Other | Attending: Hematology & Oncology

## 2023-07-26 ENCOUNTER — Inpatient Hospital Stay (HOSPITAL_BASED_OUTPATIENT_CLINIC_OR_DEPARTMENT_OTHER): Payer: Medicare Other | Admitting: Family

## 2023-07-26 ENCOUNTER — Encounter: Payer: Self-pay | Admitting: Family

## 2023-07-26 ENCOUNTER — Encounter: Payer: Self-pay | Admitting: Hematology & Oncology

## 2023-07-26 VITALS — BP 156/80 | HR 58 | Temp 97.6°F | Resp 18 | Wt 208.0 lb

## 2023-07-26 VITALS — BP 161/75 | HR 57

## 2023-07-26 DIAGNOSIS — D631 Anemia in chronic kidney disease: Secondary | ICD-10-CM | POA: Diagnosis not present

## 2023-07-26 DIAGNOSIS — C9002 Multiple myeloma in relapse: Secondary | ICD-10-CM | POA: Insufficient documentation

## 2023-07-26 DIAGNOSIS — N183 Chronic kidney disease, stage 3 unspecified: Secondary | ICD-10-CM

## 2023-07-26 DIAGNOSIS — C9000 Multiple myeloma not having achieved remission: Secondary | ICD-10-CM | POA: Diagnosis not present

## 2023-07-26 DIAGNOSIS — R232 Flushing: Secondary | ICD-10-CM

## 2023-07-26 DIAGNOSIS — K909 Intestinal malabsorption, unspecified: Secondary | ICD-10-CM | POA: Diagnosis not present

## 2023-07-26 DIAGNOSIS — Z79620 Long term (current) use of immunosuppressive biologic: Secondary | ICD-10-CM | POA: Diagnosis not present

## 2023-07-26 DIAGNOSIS — Z5112 Encounter for antineoplastic immunotherapy: Secondary | ICD-10-CM | POA: Diagnosis present

## 2023-07-26 LAB — CBC WITH DIFFERENTIAL (CANCER CENTER ONLY)
Abs Immature Granulocytes: 0.02 10*3/uL (ref 0.00–0.07)
Basophils Absolute: 0 10*3/uL (ref 0.0–0.1)
Basophils Relative: 1 %
Eosinophils Absolute: 0.1 10*3/uL (ref 0.0–0.5)
Eosinophils Relative: 2 %
HCT: 33.5 % — ABNORMAL LOW (ref 39.0–52.0)
Hemoglobin: 11.1 g/dL — ABNORMAL LOW (ref 13.0–17.0)
Immature Granulocytes: 0 %
Lymphocytes Relative: 44 %
Lymphs Abs: 2.9 10*3/uL (ref 0.7–4.0)
MCH: 30.9 pg (ref 26.0–34.0)
MCHC: 33.1 g/dL (ref 30.0–36.0)
MCV: 93.3 fL (ref 80.0–100.0)
Monocytes Absolute: 0.6 10*3/uL (ref 0.1–1.0)
Monocytes Relative: 9 %
Neutro Abs: 2.9 10*3/uL (ref 1.7–7.7)
Neutrophils Relative %: 44 %
Platelet Count: 187 10*3/uL (ref 150–400)
RBC: 3.59 MIL/uL — ABNORMAL LOW (ref 4.22–5.81)
RDW: 13.6 % (ref 11.5–15.5)
WBC Count: 6.6 10*3/uL (ref 4.0–10.5)
nRBC: 0 % (ref 0.0–0.2)

## 2023-07-26 LAB — CMP (CANCER CENTER ONLY)
ALT: 10 U/L (ref 0–44)
AST: 14 U/L — ABNORMAL LOW (ref 15–41)
Albumin: 4.2 g/dL (ref 3.5–5.0)
Alkaline Phosphatase: 46 U/L (ref 38–126)
Anion gap: 7 (ref 5–15)
BUN: 24 mg/dL — ABNORMAL HIGH (ref 8–23)
CO2: 28 mmol/L (ref 22–32)
Calcium: 10.3 mg/dL (ref 8.9–10.3)
Chloride: 103 mmol/L (ref 98–111)
Creatinine: 1.28 mg/dL — ABNORMAL HIGH (ref 0.61–1.24)
GFR, Estimated: 54 mL/min — ABNORMAL LOW (ref >60)
Glucose, Bld: 106 mg/dL — ABNORMAL HIGH (ref 70–99)
Potassium: 3.6 mmol/L (ref 3.5–5.1)
Sodium: 138 mmol/L (ref 135–145)
Total Bilirubin: 0.7 mg/dL (ref 0.0–1.2)
Total Protein: 6.2 g/dL — ABNORMAL LOW (ref 6.5–8.1)

## 2023-07-26 LAB — LACTATE DEHYDROGENASE: LDH: 211 U/L — ABNORMAL HIGH (ref 98–192)

## 2023-07-26 MED ORDER — SODIUM CHLORIDE 0.9% FLUSH
10.0000 mL | Freq: Once | INTRAVENOUS | Status: AC
  Administered 2023-07-26: 10 mL via INTRAVENOUS

## 2023-07-26 MED ORDER — DEXAMETHASONE 4 MG PO TABS
20.0000 mg | ORAL_TABLET | Freq: Once | ORAL | Status: AC
Start: 2023-07-26 — End: 2023-07-26
  Administered 2023-07-26: 20 mg via ORAL
  Filled 2023-07-26: qty 5

## 2023-07-26 MED ORDER — ACETAMINOPHEN 325 MG PO TABS
650.0000 mg | ORAL_TABLET | Freq: Once | ORAL | Status: AC
Start: 2023-07-26 — End: 2023-07-26
  Administered 2023-07-26: 650 mg via ORAL
  Filled 2023-07-26: qty 2

## 2023-07-26 MED ORDER — HEPARIN SOD (PORK) LOCK FLUSH 100 UNIT/ML IV SOLN
500.0000 [IU] | Freq: Once | INTRAVENOUS | Status: AC
  Administered 2023-07-26: 500 [IU] via INTRAVENOUS

## 2023-07-26 MED ORDER — DARATUMUMAB-HYALURONIDASE-FIHJ 1800-30000 MG-UT/15ML ~~LOC~~ SOLN
1800.0000 mg | Freq: Once | SUBCUTANEOUS | Status: AC
  Administered 2023-07-26: 1800 mg via SUBCUTANEOUS
  Filled 2023-07-26: qty 15

## 2023-07-26 MED ORDER — DIPHENHYDRAMINE HCL 25 MG PO CAPS
50.0000 mg | ORAL_CAPSULE | Freq: Once | ORAL | Status: AC
  Administered 2023-07-26: 50 mg via ORAL
  Filled 2023-07-26: qty 2

## 2023-07-26 NOTE — Patient Instructions (Signed)
 [facility]  Discharge Instructions: Thank you for choosing [facility] to provide your oncology and hematology care.   If you have a lab appointment with the Cancer Center, please go directly to the Cancer Center and check in at the registration area.  Wear comfortable clothing and clothing appropriate for easy access to any Portacath or PICC line.   We strive to give you quality time with your provider. You may need to reschedule your appointment if you arrive late (15 or more minutes).  Arriving late affects you and other patients whose appointments are after yours.  Also, if you miss three or more appointments without notifying the office, you may be dismissed from the clinic at the provider's discretion.      For prescription refill requests, have your pharmacy contact our office and allow 72 hours for refills to be completed.    Today you received the following chemotherapy and/or immunotherapy agents Daratumumab       To help prevent nausea and vomiting after your treatment, we encourage you to take your nausea medication as directed.  BELOW ARE SYMPTOMS THAT SHOULD BE REPORTED IMMEDIATELY: *FEVER GREATER THAN 100.4 F (38 C) OR HIGHER *CHILLS OR SWEATING *NAUSEA AND VOMITING THAT IS NOT CONTROLLED WITH YOUR NAUSEA MEDICATION *UNUSUAL SHORTNESS OF BREATH *UNUSUAL BRUISING OR BLEEDING *URINARY PROBLEMS (pain or burning when urinating, or frequent urination) *BOWEL PROBLEMS (unusual diarrhea, constipation, pain near the anus) TENDERNESS IN MOUTH AND THROAT WITH OR WITHOUT PRESENCE OF ULCERS (sore throat, sores in mouth, or a toothache) UNUSUAL RASH, SWELLING OR PAIN  UNUSUAL VAGINAL DISCHARGE OR ITCHING   Items with * indicate a potential emergency and should be followed up as soon as possible or go to the Emergency Department if any problems should occur.  Please show the CHEMOTHERAPY ALERT CARD or  IMMUNOTHERAPY ALERT CARD at check-in to the Emergency Department and triage nurse. Should you have questions after your visit or need to cancel or reschedule your appointment, please contact [facility] - A DEPT OF [facility]  [phone] and follow the prompts.  Office hours are 8:00 a.m. to 4:30 p.m. Monday - Friday. Please note that voicemails left after 4:00 p.m. may not be returned until the following business day.  We are closed weekends and major holidays. You have access to a nurse at all times for urgent questions. Please call the main number to the clinic [phone] and follow the prompts.  For any non-urgent questions, you may also contact your provider using MyChart. We now offer e-Visits for anyone 1 and older to request care online for non-urgent symptoms. For details visit [email or web address].   Also download the MyChart app! Go to the app store, search "MyChart", open the app, select [place], and log in with your MyChart username and password.

## 2023-07-26 NOTE — Progress Notes (Signed)
 Hematology and Oncology Follow Up Visit  YUKI PURVES [id number] 1934/03/27 89 y.o. 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 Vanduyne is here today for follow-up and treatment. He is doing well and has not complaints at this time.  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 He wears a brace and is ambulating with a cane for added support.  No falls or syncope reported.  Last month, no M-spike observed last month, IgG level 620 and kappa light chains 1.54 mg/dL.  24 hr urine was also negative.  He has had no issues with infection. No fever, chills, n/v, cough, rash, dizziness, SOB, chest pain, palpitations, abdominal pain or changes in bowel or bladder habits.  No swelling in the extremities. Pedal pulses are 1+.  Patient states appetite and hydration are good. Weight is 208 lbs.   ECOG Performance Status: 1 - Symptomatic but completely ambulatory  Medications:  Allergies as of 07/26/2023   No Known Allergies      Medication List        Accurate as of July 26, 2023 11:01 AM. If you have any questions, ask your nurse or doctor.          acetaminophen 650 MG CR tablet Commonly known as: TYLENOL Take 650 mg by mouth every 8 (eight) hours as needed for pain.   aspirin 81 MG tablet Take 81 mg by mouth daily.   benzonatate 100 MG capsule Commonly known as: TESSALON Take 1 capsule (100 mg total) by  mouth 3 (three) times daily as needed for cough.   bimatoprost 0.01 % Soln Commonly known as: LUMIGAN Place 1 drop into both eyes at bedtime.   CALTRATE 600+D PLUS PO Take 1 tablet by mouth daily.   carvedilol 25 MG tablet Commonly known as: COREG Take 25 mg by mouth 2 (two) times daily.   cloNIDine 0.1 MG tablet Commonly known as: CATAPRES Take 0.1 mg by mouth 2 (two) times daily as needed (Elevated BP).   cyclobenzaprine 10 MG tablet Commonly known as: FLEXERIL Take 1 tablet (10 mg total) by mouth 2 (two) times daily as needed (back pain, muscle spasms).   dexamethasone 4 MG tablet Commonly known as: DECADRON Take 5 tablets prior to immunotherapy treatment What changed:  how much to take how to take this when to take this   diclofenac Sodium 1 % Gel Commonly known as: VOLTAREN Apply 2 g topically 4 (four) times daily. As needed   famciclovir 250 MG tablet Commonly known as: FAMVIR TAKE 1 TABLET DAILY   Flonase Allergy Relief 50 MCG/ACT nasal spray Generic drug: fluticasone Place 1 spray into both nostrils daily.   losartan 100 MG tablet Commonly known as: COZAAR Take 100 mg by mouth daily.   meloxicam 15 MG tablet Commonly known as: MOBIC Take 1 tablet  by mouth daily as needed for pain.   metolazone 5 MG tablet Commonly known as: ZAROXOLYN TAKE 1 TABLET 1 HOUR PRIOR TO TAKING LASIX DAILY   montelukast 10 MG tablet Commonly known as: SINGULAIR TAKE 1 TABLET AT BEDTIME   potassium chloride SA 20 MEQ tablet Commonly known as: KLOR-CON M Take 1 tablet (20 mEq total) by mouth daily.   rosuvastatin 20 MG tablet Commonly known as: CRESTOR Take 20 mg by mouth at bedtime.   SYSTANE OP Place 1 drop into both eyes daily as needed (dry eyes).   Zoledronic Acid 4 MG/100ML IVPB Commonly known as: ZOMETA Inject 4 mg into the vein every 30 (thirty) days. 07/03/2022 Every 4 months.        Allergies: No Known Allergies  Past Medical History, Surgical  history, Social history, and Family History were reviewed and updated.  Review of Systems: All other 10 point review of systems is negative.   Physical Exam:  weight is 208 lb (94.3 kg). His oral temperature is 97.6 F (36.4 C). His blood pressure is 156/80 (abnormal) and his pulse is 58 (abnormal). His respiration is 18 and oxygen saturation is 100%.   Wt Readings from Last 3 Encounters:  07/26/23 208 lb (94.3 kg)  06/29/23 210 lb (95.3 kg)  06/01/23 208 lb 4 oz (94.5 kg)    Ocular: Sclerae unicteric, pupils equal, round and reactive to light Ear-nose-throat: Oropharynx clear, dentition fair Lymphatic: No cervical or supraclavicular adenopathy Lungs no rales or rhonchi, good excursion bilaterally Heart regular rate and rhythm, no murmur appreciated Abd soft, nontender, positive bowel sounds MSK no focal spinal tenderness, no joint edema Neuro: non-focal, well-oriented, appropriate affect Breasts: Deferred   Lab Results  Component Value Date   WBC 6.6 07/26/2023   HGB 11.1 (L) 07/26/2023   HCT 33.5 (L) 07/26/2023   MCV 93.3 07/26/2023   PLT 187 07/26/2023   Lab Results  Component Value Date   FERRITIN 555 (H) 07/23/2021   IRON 84 07/23/2021   TIBC 325 07/23/2021   UIBC 241 07/23/2021   IRONPCTSAT 26 07/23/2021   Lab Results  Component Value Date   RETICCTPCT 2.2 04/05/2019   RBC 3.59 (L) 07/26/2023   Lab Results  Component Value Date   KPAFRELGTCHN 15.4 06/29/2023   LAMBDASER 7.5 06/29/2023   KAPLAMBRATIO 4.77 07/02/2023   Lab Results  Component Value Date   IGGSERUM 620 06/29/2023   IGA 31 (L) 06/29/2023   IGMSERUM 79 06/29/2023   Lab Results  Component Value Date   TOTALPROTELP 5.9 (L) 06/29/2023   ALBUMINELP 3.4 06/29/2023   A1GS 0.2 06/29/2023   A2GS 1.0 06/29/2023   BETS 0.7 06/29/2023   BETA2SER 0.3 01/03/2015   GAMS 0.6 06/29/2023   MSPIKE Not Observed 06/29/2023   SPEI Comment 11/03/2022     Chemistry      Component Value Date/Time   NA  140 06/29/2023 1020   NA 137 04/08/2017 1141   NA 138 10/29/2015 1029   K 3.6 06/29/2023 1020   K 3.4 04/08/2017 1141   K 3.9 10/29/2015 1029   CL 103 06/29/2023 1020   CL 102 04/08/2017 1141   CO2 28 06/29/2023 1020   CO2 30 04/08/2017 1141   CO2 27 10/29/2015 1029   BUN 26 (H) 06/29/2023 1020   BUN 16 04/08/2017 1141   BUN 13.8 10/29/2015 1029   CREATININE 1.22 06/29/2023 1020   CREATININE 1.3 (H) 04/08/2017 1141   CREATININE 1.1 10/29/2015 1029  Component Value Date/Time   CALCIUM 9.8 06/29/2023 1020   CALCIUM 9.3 04/08/2017 1141   CALCIUM 9.3 10/29/2015 1029   ALKPHOS 46 06/29/2023 1020   ALKPHOS 66 04/08/2017 1141   ALKPHOS 52 10/29/2015 1029   AST 15 06/29/2023 1020   AST 20 10/29/2015 1029   ALT 11 06/29/2023 1020   ALT 21 04/08/2017 1141   ALT 14 10/29/2015 1029   BILITOT 0.7 06/29/2023 1020   BILITOT 0.75 10/29/2015 1029       Impression and Plan: Mr. Bramblett is a very pleasant 89 yo African American gentleman with relapsed IgG kappa myeloma. He has history of stem cell transplant in 2006 and since relapsed.    We will proceed with Faspro today as planned.  No ESA needed, Hgb > 11.  Protein studies are pending.  Follow-up in 1 month.   Eileen Stanford, NP 3/3/202511:01 AM

## 2023-07-27 LAB — IGG, IGA, IGM
IgA: 30 mg/dL — ABNORMAL LOW (ref 61–437)
IgG (Immunoglobin G), Serum: 619 mg/dL (ref 603–1613)
IgM (Immunoglobulin M), Srm: 75 mg/dL (ref 15–143)

## 2023-07-27 LAB — KAPPA/LAMBDA LIGHT CHAINS
Kappa free light chain: 14.2 mg/L (ref 3.3–19.4)
Kappa, lambda light chain ratio: 2.22 — ABNORMAL HIGH (ref 0.26–1.65)
Lambda free light chains: 6.4 mg/L (ref 5.7–26.3)

## 2023-07-28 ENCOUNTER — Other Ambulatory Visit: Payer: Self-pay

## 2023-08-03 LAB — PROTEIN ELECTROPHORESIS, SERUM, WITH REFLEX
A/G Ratio: 1.5 (ref 0.7–1.7)
Albumin ELP: 3.6 g/dL (ref 2.9–4.4)
Alpha-1-Globulin: 0.2 g/dL (ref 0.0–0.4)
Alpha-2-Globulin: 0.8 g/dL (ref 0.4–1.0)
Beta Globulin: 0.9 g/dL (ref 0.7–1.3)
Gamma Globulin: 0.6 g/dL (ref 0.4–1.8)
Globulin, Total: 2.4 g/dL (ref 2.2–3.9)
Total Protein ELP: 6 g/dL (ref 6.0–8.5)

## 2023-08-06 ENCOUNTER — Other Ambulatory Visit: Payer: Self-pay

## 2023-08-08 ENCOUNTER — Other Ambulatory Visit: Payer: Self-pay

## 2023-08-23 ENCOUNTER — Encounter: Payer: Self-pay | Admitting: Hematology & Oncology

## 2023-08-23 ENCOUNTER — Inpatient Hospital Stay: Payer: Medicare Other

## 2023-08-23 ENCOUNTER — Inpatient Hospital Stay (HOSPITAL_BASED_OUTPATIENT_CLINIC_OR_DEPARTMENT_OTHER): Payer: Medicare Other | Admitting: Hematology & Oncology

## 2023-08-23 ENCOUNTER — Other Ambulatory Visit: Payer: Self-pay

## 2023-08-23 VITALS — BP 163/76 | HR 53

## 2023-08-23 VITALS — BP 160/78 | HR 69 | Temp 97.6°F | Resp 17 | Ht 71.0 in | Wt 212.0 lb

## 2023-08-23 DIAGNOSIS — C9000 Multiple myeloma not having achieved remission: Secondary | ICD-10-CM

## 2023-08-23 DIAGNOSIS — N183 Chronic kidney disease, stage 3 unspecified: Secondary | ICD-10-CM

## 2023-08-23 DIAGNOSIS — Z5112 Encounter for antineoplastic immunotherapy: Secondary | ICD-10-CM | POA: Diagnosis not present

## 2023-08-23 LAB — CMP (CANCER CENTER ONLY)
ALT: 9 U/L (ref 0–44)
AST: 14 U/L — ABNORMAL LOW (ref 15–41)
Albumin: 4.1 g/dL (ref 3.5–5.0)
Alkaline Phosphatase: 44 U/L (ref 38–126)
Anion gap: 9 (ref 5–15)
BUN: 21 mg/dL (ref 8–23)
CO2: 29 mmol/L (ref 22–32)
Calcium: 9.7 mg/dL (ref 8.9–10.3)
Chloride: 104 mmol/L (ref 98–111)
Creatinine: 1.24 mg/dL (ref 0.61–1.24)
GFR, Estimated: 56 mL/min — ABNORMAL LOW (ref >60)
Glucose, Bld: 102 mg/dL — ABNORMAL HIGH (ref 70–99)
Potassium: 3.5 mmol/L (ref 3.5–5.1)
Sodium: 142 mmol/L (ref 135–145)
Total Bilirubin: 0.7 mg/dL (ref 0.0–1.2)
Total Protein: 6.2 g/dL — ABNORMAL LOW (ref 6.5–8.1)

## 2023-08-23 LAB — CBC WITH DIFFERENTIAL (CANCER CENTER ONLY)
Abs Immature Granulocytes: 0.01 10*3/uL (ref 0.00–0.07)
Basophils Absolute: 0 10*3/uL (ref 0.0–0.1)
Basophils Relative: 1 %
Eosinophils Absolute: 0.2 10*3/uL (ref 0.0–0.5)
Eosinophils Relative: 2 %
HCT: 33.8 % — ABNORMAL LOW (ref 39.0–52.0)
Hemoglobin: 11.1 g/dL — ABNORMAL LOW (ref 13.0–17.0)
Immature Granulocytes: 0 %
Lymphocytes Relative: 42 %
Lymphs Abs: 2.7 10*3/uL (ref 0.7–4.0)
MCH: 30.7 pg (ref 26.0–34.0)
MCHC: 32.8 g/dL (ref 30.0–36.0)
MCV: 93.4 fL (ref 80.0–100.0)
Monocytes Absolute: 0.6 10*3/uL (ref 0.1–1.0)
Monocytes Relative: 9 %
Neutro Abs: 2.9 10*3/uL (ref 1.7–7.7)
Neutrophils Relative %: 46 %
Platelet Count: 184 10*3/uL (ref 150–400)
RBC: 3.62 MIL/uL — ABNORMAL LOW (ref 4.22–5.81)
RDW: 13.6 % (ref 11.5–15.5)
WBC Count: 6.3 10*3/uL (ref 4.0–10.5)
nRBC: 0 % (ref 0.0–0.2)

## 2023-08-23 LAB — LACTATE DEHYDROGENASE: LDH: 200 U/L — ABNORMAL HIGH (ref 98–192)

## 2023-08-23 MED ORDER — SODIUM CHLORIDE 0.9% FLUSH
10.0000 mL | Freq: Once | INTRAVENOUS | Status: AC
  Administered 2023-08-23: 10 mL

## 2023-08-23 MED ORDER — DIPHENHYDRAMINE HCL 25 MG PO CAPS
50.0000 mg | ORAL_CAPSULE | Freq: Once | ORAL | Status: AC
  Administered 2023-08-23: 50 mg via ORAL
  Filled 2023-08-23: qty 2

## 2023-08-23 MED ORDER — DARATUMUMAB-HYALURONIDASE-FIHJ 1800-30000 MG-UT/15ML ~~LOC~~ SOLN
1800.0000 mg | Freq: Once | SUBCUTANEOUS | Status: AC
  Administered 2023-08-23: 1800 mg via SUBCUTANEOUS
  Filled 2023-08-23: qty 15

## 2023-08-23 MED ORDER — DEXAMETHASONE 4 MG PO TABS
20.0000 mg | ORAL_TABLET | Freq: Once | ORAL | Status: AC
  Administered 2023-08-23: 20 mg via ORAL
  Filled 2023-08-23: qty 5

## 2023-08-23 MED ORDER — ACETAMINOPHEN 325 MG PO TABS
650.0000 mg | ORAL_TABLET | Freq: Once | ORAL | Status: AC
  Administered 2023-08-23: 650 mg via ORAL
  Filled 2023-08-23: qty 2

## 2023-08-23 MED ORDER — HEPARIN SOD (PORK) LOCK FLUSH 100 UNIT/ML IV SOLN
500.0000 [IU] | Freq: Once | INTRAVENOUS | Status: AC
  Administered 2023-08-23: 500 [IU] via INTRAVENOUS

## 2023-08-23 NOTE — Patient Instructions (Signed)
 [facility] - A DEPT OF [facility]  Discharge Instructions: Thank you for choosing [facility] to provide your oncology and hematology care.   If you have a lab appointment with the Cancer Center, please go directly to the Cancer Center and check in at the registration area.  Wear comfortable clothing and clothing appropriate for easy access to any Portacath or PICC line.   We strive to give you quality time with your provider. You may need to reschedule your appointment if you arrive late (15 or more minutes).  Arriving late affects you and other patients whose appointments are after yours.  Also, if you miss three or more appointments without notifying the office, you may be dismissed from the clinic at the provider's discretion.      For prescription refill requests, have your pharmacy contact our office and allow 72 hours for refills to be completed.    Today you received the following chemotherapy and/or immunotherapy agents daratumumab       To help prevent nausea and vomiting after your treatment, we encourage you to take your nausea medication as directed.  BELOW ARE SYMPTOMS THAT SHOULD BE REPORTED IMMEDIATELY: *FEVER GREATER THAN 100.4 F (38 C) OR HIGHER *CHILLS OR SWEATING *NAUSEA AND VOMITING THAT IS NOT CONTROLLED WITH YOUR NAUSEA MEDICATION *UNUSUAL SHORTNESS OF BREATH *UNUSUAL BRUISING OR BLEEDING *URINARY PROBLEMS (pain or burning when urinating, or frequent urination) *BOWEL PROBLEMS (unusual diarrhea, constipation, pain near the anus) TENDERNESS IN MOUTH AND THROAT WITH OR WITHOUT PRESENCE OF ULCERS (sore throat, sores in mouth, or a toothache) UNUSUAL RASH, SWELLING OR PAIN  UNUSUAL VAGINAL DISCHARGE OR ITCHING   Items with * indicate a potential emergency and should be followed up as soon as possible or go to the Emergency Department if any problems should occur.  Please show the CHEMOTHERAPY ALERT CARD or  IMMUNOTHERAPY ALERT CARD at check-in to the Emergency Department and triage nurse. Should you have questions after your visit or need to cancel or reschedule your appointment, please contact [facility] - A DEPT OF Eligha Brideg[facility]  [phone] and follow the prompts.  Office hours are 8:00 a.m. to 4:30 p.m. Monday - Friday. Please note that voicemails left after 4:00 p.m. may not be returned until the following business day.  We are closed weekends and major holidays. You have access to a nurse at all times for urgent questions. Please call the main number to the clinic [phone] and follow the prompts.  For any non-urgent questions, you may also contact your provider using MyChart. We now offer e-Visits for anyone 39 and older to request care online for non-urgent symptoms. For details visit [email or web address].   Also download the MyChart app! Go to the app store, search "MyChart", open the app, select [place], and log in with your MyChart username and password.

## 2023-08-23 NOTE — Patient Instructions (Signed)

## 2023-08-23 NOTE — Progress Notes (Signed)
 Hematology and Oncology Follow Up Visit  Robert [name] [id number] 1933/10/28 89 y.o. 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  So far, everything is going pretty well with the myeloma.  He does have the foot drop which is not related to the myeloma.  He does have a herniated disc at L5-S1.  However, the neurosurgeon-Dr. Moore-does not feel that surgery is needed right now.  He still wants to just watch.  He wears a brace on his left leg.  This does help somewhat.   As far as his myeloma goes everything is doing well.  The last time that we saw him, there was no monoclonal spike in his blood.  His IgG level was 620 mg/dL.  The kappa light chain was 1.2 mg/dL.  His appetite has been good.  In fact, his appetite has been too good.  He really needs to lose weight.  I told him that he really needs to get down to about 180 pounds.  I think this will help his back.  He has had no change in bowel or bladder habits.  He has had no cough.  There has been no swollen lymph nodes.  He has had no mouth sores.  Overall, I would say his performance status is probably ECOG 1.  .    Medications:  Allergies as of 08/23/2023   No Known Allergies      Medication List        Accurate as of August 23, 2023 11:22 AM. If you have any questions, ask your nurse or doctor.          STOP taking these medications    benzonatate 100 MG capsule Commonly known as:  TESSALON Stopped by: Josph Macho   cyclobenzaprine 10 MG tablet Commonly known as: FLEXERIL Stopped by: Josph Macho   Flonase Allergy Relief 50 MCG/ACT nasal spray Generic drug: fluticasone Stopped by: Josph Macho   metolazone 5 MG tablet Commonly known as: ZAROXOLYN Stopped by: Josph Macho       TAKE these medications    acetaminophen 650 MG CR tablet Commonly known as: TYLENOL Take 650 mg by mouth every 8 (eight) hours as needed for pain.   aspirin 81 MG tablet Take 81 mg by mouth daily.   bimatoprost 0.01 % Soln Commonly known as: LUMIGAN Place 1 drop into both eyes at bedtime.   CALTRATE 600+D PLUS PO Take 1 tablet by mouth daily.   carvedilol 25 MG tablet Commonly known as: COREG Take 25 mg by mouth 2 (two) times daily.   cloNIDine 0.1 MG tablet Commonly known as: CATAPRES Take 0.1 mg by mouth 2 (two) times daily as needed (Elevated BP).   dexamethasone 4 MG tablet Commonly known as: DECADRON Take 5 tablets prior to immunotherapy treatment What changed:  how much to take how to take this  when to take this   diclofenac Sodium 1 % Gel Commonly known as: VOLTAREN Apply 2 g topically 4 (four) times daily. As needed   famciclovir 250 MG tablet Commonly known as: FAMVIR TAKE 1 TABLET DAILY   losartan 100 MG tablet Commonly known as: COZAAR Take 100 mg by mouth daily.   meloxicam 15 MG tablet Commonly known as: MOBIC Take 1 tablet by mouth daily as needed for pain.   montelukast 10 MG tablet Commonly known as: SINGULAIR TAKE 1 TABLET AT BEDTIME   potassium chloride SA 20 MEQ tablet Commonly known as: KLOR-CON M Take 1 tablet (20 mEq total) by mouth daily.   rosuvastatin 20 MG tablet Commonly known as: CRESTOR Take 20 mg by mouth at bedtime.   SYSTANE OP Place 1 drop into both eyes daily as needed (dry eyes).   Zoledronic Acid 4 MG/100ML IVPB Commonly known as: ZOMETA Inject 4 mg into the vein every 30 (thirty) days.  07/03/2022 Every 4 months.        Allergies: No Known Allergies  Past Medical History, Surgical history, Social history, and Family History were reviewed and updated.  Review of Systems: Review of Systems  Constitutional: Negative.   HENT: Negative.    Eyes: Negative.   Respiratory: Negative.    Cardiovascular: Negative.   Gastrointestinal: Negative.   Genitourinary: Negative.   Musculoskeletal:  Positive for joint pain.  Skin: Negative.   Neurological: Negative.   Endo/Heme/Allergies: Negative.   Psychiatric/Behavioral: Negative.       Physical Exam:  height is 5\' 11"  (1.803 m) and weight is 212 lb (96.2 kg). His oral temperature is 97.6 F (36.4 C). His blood pressure is 160/78 (abnormal) and his pulse is 69. His respiration is 17 and oxygen saturation is 100%.   Wt Readings from Last 3 Encounters:  08/23/23 212 lb (96.2 kg)  07/26/23 208 lb (94.3 kg)  06/29/23 210 lb (95.3 kg)   Physical Exam Vitals reviewed.  HENT:     Head: Normocephalic and atraumatic.  Eyes:     Pupils: Pupils are equal, round, and reactive to light.  Cardiovascular:     Rate and Rhythm: Normal rate and regular rhythm.     Heart sounds: Normal heart sounds.  Pulmonary:     Effort: Pulmonary effort is normal.     Breath sounds: Normal breath sounds.  Abdominal:     General: Bowel sounds are normal.     Palpations: Abdomen is soft.  Musculoskeletal:        General: No tenderness or deformity. Normal range of motion.     Cervical back: Normal range of motion.  Lymphadenopathy:     Cervical: No cervical adenopathy.  Skin:    General: Skin is warm and dry.     Findings: No erythema or rash.  Neurological:     Mental Status: He is alert and oriented to person, place, and time.     Comments: On his neurological exam, he does have decreased function in the left foot with dorsiflexion.  Right foot is unremarkable.  He has good pulses.  He has no tenderness to palpation over the left lower leg.   Psychiatric:        Behavior: Behavior normal.        Thought Content: Thought content normal.        Judgment: Judgment normal.      Lab Results  Component Value Date   WBC 6.3 08/23/2023   HGB 11.1 (L) 08/23/2023  HCT 33.8 (L) 08/23/2023   MCV 93.4 08/23/2023   PLT 184 08/23/2023   Lab Results  Component Value Date   FERRITIN 555 (H) 07/23/2021   IRON 84 07/23/2021   TIBC 325 07/23/2021   UIBC 241 07/23/2021   IRONPCTSAT 26 07/23/2021   Lab Results  Component Value Date   RETICCTPCT 2.2 04/05/2019   RBC 3.62 (L) 08/23/2023   Lab Results  Component Value Date   KPAFRELGTCHN 14.2 07/26/2023   LAMBDASER 6.4 07/26/2023   KAPLAMBRATIO 2.22 (H) 07/26/2023   Lab Results  Component Value Date   IGGSERUM 619 07/26/2023   IGA 30 (L) 07/26/2023   IGMSERUM 75 07/26/2023   Lab Results  Component Value Date   TOTALPROTELP 6.0 07/26/2023   ALBUMINELP 3.6 07/26/2023   A1GS 0.2 07/26/2023   A2GS 0.8 07/26/2023   BETS 0.9 07/26/2023   BETA2SER 0.3 01/03/2015   GAMS 0.6 07/26/2023   MSPIKE Not Observed 07/26/2023   SPEI Comment 11/03/2022     Chemistry      Component Value Date/Time   NA 142 08/23/2023 0955   NA 137 04/08/2017 1141   NA 138 10/29/2015 1029   K 3.5 08/23/2023 0955   K 3.4 04/08/2017 1141   K 3.9 10/29/2015 1029   CL 104 08/23/2023 0955   CL 102 04/08/2017 1141   CO2 29 08/23/2023 0955   CO2 30 04/08/2017 1141   CO2 27 10/29/2015 1029   BUN 21 08/23/2023 0955   BUN 16 04/08/2017 1141   BUN 13.8 10/29/2015 1029   CREATININE 1.24 08/23/2023 0955   CREATININE 1.3 (H) 04/08/2017 1141   CREATININE 1.1 10/29/2015 1029      Component Value Date/Time   CALCIUM 9.7 08/23/2023 0955   CALCIUM 9.3 04/08/2017 1141   CALCIUM 9.3 10/29/2015 1029   ALKPHOS 44 08/23/2023 0955   ALKPHOS 66 04/08/2017 1141   ALKPHOS 52 10/29/2015 1029   AST 14 (L) 08/23/2023 0955   AST 20 10/29/2015 1029   ALT 9 08/23/2023 0955   ALT 21 04/08/2017 1141   ALT 14  10/29/2015 1029   BILITOT 0.7 08/23/2023 0955   BILITOT 0.75 10/29/2015 1029       Impression and Plan: Mr. Spivack is a very pleasant 89 yo African American gentleman with relapsed IgG kappa myeloma. He has history of stem cell transplant in 2006 and since relapsed.    So far, things continue to do well for him.  Again, if he needs any intervention for this foot drop, I do not see a problem with this from a perspective of myeloma.  If he needs having an epidural injection, I do not see a problem with this.  He will continue on the Faspro.  He has done incredibly well on this.  We will plan to get back in 1 month.  Josph Macho, MD 3/31/202511:22 AM

## 2023-08-24 LAB — KAPPA/LAMBDA LIGHT CHAINS
Kappa free light chain: 14.5 mg/L (ref 3.3–19.4)
Kappa, lambda light chain ratio: 1.96 — ABNORMAL HIGH (ref 0.26–1.65)
Lambda free light chains: 7.4 mg/L (ref 5.7–26.3)

## 2023-08-24 LAB — IGG, IGA, IGM
IgA: 30 mg/dL — ABNORMAL LOW (ref 61–437)
IgG (Immunoglobin G), Serum: 591 mg/dL — ABNORMAL LOW (ref 603–1613)
IgM (Immunoglobulin M), Srm: 76 mg/dL (ref 15–143)

## 2023-08-26 LAB — PROTEIN ELECTROPHORESIS, SERUM
A/G Ratio: 1.5 (ref 0.7–1.7)
Albumin ELP: 3.5 g/dL (ref 2.9–4.4)
Alpha-1-Globulin: 0.2 g/dL (ref 0.0–0.4)
Alpha-2-Globulin: 0.7 g/dL (ref 0.4–1.0)
Beta Globulin: 0.9 g/dL (ref 0.7–1.3)
Gamma Globulin: 0.5 g/dL (ref 0.4–1.8)
Globulin, Total: 2.3 g/dL (ref 2.2–3.9)
Total Protein ELP: 5.8 g/dL — ABNORMAL LOW (ref 6.0–8.5)

## 2023-09-20 ENCOUNTER — Inpatient Hospital Stay (HOSPITAL_BASED_OUTPATIENT_CLINIC_OR_DEPARTMENT_OTHER): Admitting: Hematology & Oncology

## 2023-09-20 ENCOUNTER — Inpatient Hospital Stay: Attending: Hematology & Oncology

## 2023-09-20 ENCOUNTER — Inpatient Hospital Stay

## 2023-09-20 ENCOUNTER — Encounter: Payer: Self-pay | Admitting: Hematology & Oncology

## 2023-09-20 VITALS — BP 178/81 | HR 65 | Resp 17

## 2023-09-20 VITALS — BP 162/84 | HR 62 | Temp 97.5°F | Resp 19 | Ht 71.0 in | Wt 213.4 lb

## 2023-09-20 DIAGNOSIS — Z5112 Encounter for antineoplastic immunotherapy: Secondary | ICD-10-CM | POA: Insufficient documentation

## 2023-09-20 DIAGNOSIS — C9002 Multiple myeloma in relapse: Secondary | ICD-10-CM | POA: Diagnosis present

## 2023-09-20 DIAGNOSIS — C9000 Multiple myeloma not having achieved remission: Secondary | ICD-10-CM

## 2023-09-20 DIAGNOSIS — Z79620 Long term (current) use of immunosuppressive biologic: Secondary | ICD-10-CM | POA: Insufficient documentation

## 2023-09-20 LAB — CBC WITH DIFFERENTIAL (CANCER CENTER ONLY)
Abs Immature Granulocytes: 0.01 10*3/uL (ref 0.00–0.07)
Basophils Absolute: 0 10*3/uL (ref 0.0–0.1)
Basophils Relative: 1 %
Eosinophils Absolute: 0.2 10*3/uL (ref 0.0–0.5)
Eosinophils Relative: 3 %
HCT: 33.4 % — ABNORMAL LOW (ref 39.0–52.0)
Hemoglobin: 11 g/dL — ABNORMAL LOW (ref 13.0–17.0)
Immature Granulocytes: 0 %
Lymphocytes Relative: 40 %
Lymphs Abs: 2.3 10*3/uL (ref 0.7–4.0)
MCH: 30.7 pg (ref 26.0–34.0)
MCHC: 32.9 g/dL (ref 30.0–36.0)
MCV: 93.3 fL (ref 80.0–100.0)
Monocytes Absolute: 0.5 10*3/uL (ref 0.1–1.0)
Monocytes Relative: 8 %
Neutro Abs: 2.8 10*3/uL (ref 1.7–7.7)
Neutrophils Relative %: 48 %
Platelet Count: 185 10*3/uL (ref 150–400)
RBC: 3.58 MIL/uL — ABNORMAL LOW (ref 4.22–5.81)
RDW: 13.7 % (ref 11.5–15.5)
WBC Count: 5.8 10*3/uL (ref 4.0–10.5)
nRBC: 0 % (ref 0.0–0.2)

## 2023-09-20 LAB — CMP (CANCER CENTER ONLY)
ALT: 10 U/L (ref 0–44)
AST: 21 U/L (ref 15–41)
Albumin: 4.1 g/dL (ref 3.5–5.0)
Alkaline Phosphatase: 50 U/L (ref 38–126)
Anion gap: 12 (ref 5–15)
BUN: 21 mg/dL (ref 8–23)
CO2: 23 mmol/L (ref 22–32)
Calcium: 10.2 mg/dL (ref 8.9–10.3)
Chloride: 104 mmol/L (ref 98–111)
Creatinine: 1.27 mg/dL — ABNORMAL HIGH (ref 0.61–1.24)
GFR, Estimated: 55 mL/min — ABNORMAL LOW (ref >60)
Glucose, Bld: 96 mg/dL (ref 70–99)
Potassium: 3.8 mmol/L (ref 3.5–5.1)
Sodium: 139 mmol/L (ref 135–145)
Total Bilirubin: 0.7 mg/dL (ref 0.0–1.2)
Total Protein: 6.1 g/dL — ABNORMAL LOW (ref 6.5–8.1)

## 2023-09-20 LAB — LACTATE DEHYDROGENASE: LDH: 227 U/L — ABNORMAL HIGH (ref 98–192)

## 2023-09-20 MED ORDER — SODIUM CHLORIDE 0.9% FLUSH
10.0000 mL | Freq: Once | INTRAVENOUS | Status: AC
  Administered 2023-09-20: 10 mL

## 2023-09-20 MED ORDER — HEPARIN SOD (PORK) LOCK FLUSH 100 UNIT/ML IV SOLN
500.0000 [IU] | Freq: Once | INTRAVENOUS | Status: AC
  Administered 2023-09-20: 500 [IU] via INTRAVENOUS

## 2023-09-20 MED ORDER — DEXAMETHASONE 4 MG PO TABS
20.0000 mg | ORAL_TABLET | Freq: Once | ORAL | Status: AC
  Administered 2023-09-20: 20 mg via ORAL
  Filled 2023-09-20: qty 5

## 2023-09-20 MED ORDER — DARATUMUMAB-HYALURONIDASE-FIHJ 1800-30000 MG-UT/15ML ~~LOC~~ SOLN
1800.0000 mg | Freq: Once | SUBCUTANEOUS | Status: AC
  Administered 2023-09-20: 1800 mg via SUBCUTANEOUS
  Filled 2023-09-20: qty 15

## 2023-09-20 MED ORDER — ACETAMINOPHEN 325 MG PO TABS
650.0000 mg | ORAL_TABLET | Freq: Once | ORAL | Status: AC
  Administered 2023-09-20: 650 mg via ORAL
  Filled 2023-09-20: qty 2

## 2023-09-20 MED ORDER — DIPHENHYDRAMINE HCL 25 MG PO CAPS
50.0000 mg | ORAL_CAPSULE | Freq: Once | ORAL | Status: AC
  Administered 2023-09-20: 50 mg via ORAL
  Filled 2023-09-20: qty 2

## 2023-09-20 NOTE — Progress Notes (Signed)
 BP remains elevated, 162/84. Forget meds this morning.  States when he checks BP at home, it is [facility].  Instructed to continue to monitor at home and notify PCP if it remains over 140/90. Verbalized understanding.

## 2023-09-20 NOTE — Patient Instructions (Signed)

## 2023-09-20 NOTE — Progress Notes (Signed)
 Hematology and Oncology Follow Up Visit  Robert [name] [id number] 10-17-1933 89 y.o. 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  So far, everything is going pretty well with the myeloma.  He does have the foot drop which is not related to the myeloma.  He does have a herniated disc at L5-S1.  However, the neurosurgeon-Dr. Moore-does not feel that surgery is needed right now.  He still wants to just watch.  He wears a brace on his left leg.  This does help somewhat.  Unfortunately, he forgot to take his blood pressure medicine today.  I think he will be okay.  I told him make sure he takes it when he gets home.  As far as his myeloma goes everything is doing well.  The last time that we saw him, there was no monoclonal spike in his blood.  His IgG level was 591 mg/dL.  The kappa light chain was 1.4 mg/dL.  His appetite has been good.  In fact, his appetite has been too good.  He really needs to lose weight.  I told him that he really needs to get down to about 180 pounds.  I think this will help his back.  He has had no change in bowel or bladder habits.  He has had no cough.  There has been no swollen lymph nodes.  He has had no mouth sores.  Overall, I would say his performance status is probably ECOG 1.  .    Medications:  Allergies as of 09/20/2023   No Known Allergies      Medication List        Accurate as of September 20, 2023 11:14 AM. If you have any  questions, ask your nurse or doctor.          acetaminophen  650 MG CR tablet Commonly known as: TYLENOL  Take 650 mg by mouth every 8 (eight) hours as needed for pain.   aspirin  81 MG tablet Take 81 mg by mouth daily.   bimatoprost  0.01 % Soln Commonly known as: LUMIGAN  Place 1 drop into both eyes at bedtime.   CALTRATE 600+D PLUS PO Take 1 tablet by mouth daily.   carvedilol  25 MG tablet Commonly known as: COREG  Take 25 mg by mouth 2 (two) times daily.   cloNIDine  0.1 MG tablet Commonly known as: CATAPRES  Take 0.1 mg by mouth 2 (two) times daily as needed (Elevated BP).   dexamethasone  4 MG tablet Commonly known as: DECADRON  Take 5 tablets prior to immunotherapy treatment What changed:  how much to take how to take this when to take this   diclofenac Sodium 1 % Gel Commonly known as: VOLTAREN Apply 2 g topically 4 (four) times daily. As needed   famciclovir  250 MG tablet Commonly known as: FAMVIR  TAKE 1 TABLET DAILY   losartan  100 MG tablet Commonly known as: COZAAR   Take 100 mg by mouth daily.   meloxicam 15 MG tablet Commonly known as: MOBIC Take 1 tablet by mouth daily as needed for pain.   metolazone  5 MG tablet Commonly known as: ZAROXOLYN  Take 5 mg by mouth daily.   montelukast  10 MG tablet Commonly known as: SINGULAIR  TAKE 1 TABLET AT BEDTIME   potassium chloride  SA 20 MEQ tablet Commonly known as: KLOR-CON  M Take 1 tablet (20 mEq total) by mouth daily.   rosuvastatin 20 MG tablet Commonly known as: CRESTOR Take 20 mg by mouth at bedtime.   SYSTANE OP Place 1 drop into both eyes daily as needed (dry eyes).   Zoledronic  Acid 4 MG/100ML IVPB Commonly known as: ZOMETA  Inject 4 mg into the vein every 30 (thirty) days. 07/03/2022 Every 4 months.        Allergies: No Known Allergies  Past Medical History, Surgical history, Social history, and Family History were reviewed and updated.  Review of Systems: Review of Systems   Constitutional: Negative.   HENT: Negative.    Eyes: Negative.   Respiratory: Negative.    Cardiovascular: Negative.   Gastrointestinal: Negative.   Genitourinary: Negative.   Musculoskeletal:  Positive for joint pain.  Skin: Negative.   Neurological: Negative.   Endo/Heme/Allergies: Negative.   Psychiatric/Behavioral: Negative.       Physical Exam:  height is 5\' 11"  (1.803 m) and weight is 213 lb 6.4 oz (96.8 kg). His oral temperature is 97.5 F (36.4 C) (abnormal). His blood pressure is 162/84 (abnormal) and his pulse is 62. His respiration is 19 and oxygen saturation is 100%.   Wt Readings from Last 3 Encounters:  09/20/23 213 lb 6.4 oz (96.8 kg)  08/23/23 212 lb (96.2 kg)  07/26/23 208 lb (94.3 kg)   Physical Exam Vitals reviewed.  HENT:     Head: Normocephalic and atraumatic.  Eyes:     Pupils: Pupils are equal, round, and reactive to light.  Cardiovascular:     Rate and Rhythm: Normal rate and regular rhythm.     Heart sounds: Normal heart sounds.  Pulmonary:     Effort: Pulmonary effort is normal.     Breath sounds: Normal breath sounds.  Abdominal:     General: Bowel sounds are normal.     Palpations: Abdomen is soft.  Musculoskeletal:        General: No tenderness or deformity. Normal range of motion.     Cervical back: Normal range of motion.  Lymphadenopathy:     Cervical: No cervical adenopathy.  Skin:    General: Skin is warm and dry.     Findings: No erythema or rash.  Neurological:     Mental Status: He is alert and oriented to person, place, and time.     Comments: On his neurological exam, he does have decreased function in the left foot with dorsiflexion.  Right foot is unremarkable.  He has good pulses.  He has no tenderness to palpation over the left lower leg.  Psychiatric:        Behavior: Behavior normal.        Thought Content: Thought content normal.        Judgment: Judgment normal.      Lab Results  Component Value Date   WBC  6.3 08/23/2023   HGB 11.1 (L) 08/23/2023   HCT 33.8 (L) 08/23/2023   MCV 93.4 08/23/2023   PLT 184 08/23/2023   Lab Results  Component Value Date   FERRITIN 555 (H)  07/23/2021   IRON 84 07/23/2021   TIBC 325 07/23/2021   UIBC 241 07/23/2021   IRONPCTSAT 26 07/23/2021   Lab Results  Component Value Date   RETICCTPCT 2.2 04/05/2019   RBC 3.62 (L) 08/23/2023   Lab Results  Component Value Date   KPAFRELGTCHN 14.5 08/23/2023   LAMBDASER 7.4 08/23/2023   KAPLAMBRATIO 1.96 (H) 08/23/2023   Lab Results  Component Value Date   IGGSERUM 591 (L) 08/23/2023   IGA 30 (L) 08/23/2023   IGMSERUM 76 08/23/2023   Lab Results  Component Value Date   TOTALPROTELP 5.8 (L) 08/23/2023   ALBUMINELP 3.5 08/23/2023   A1GS 0.2 08/23/2023   A2GS 0.7 08/23/2023   BETS 0.9 08/23/2023   BETA2SER 0.3 01/03/2015   GAMS 0.5 08/23/2023   MSPIKE Not Observed 08/23/2023   SPEI Comment 08/23/2023     Chemistry      Component Value Date/Time   NA 142 08/23/2023 0955   NA 137 04/08/2017 1141   NA 138 10/29/2015 1029   K 3.5 08/23/2023 0955   K 3.4 04/08/2017 1141   K 3.9 10/29/2015 1029   CL 104 08/23/2023 0955   CL 102 04/08/2017 1141   CO2 29 08/23/2023 0955   CO2 30 04/08/2017 1141   CO2 27 10/29/2015 1029   BUN 21 08/23/2023 0955   BUN 16 04/08/2017 1141   BUN 13.8 10/29/2015 1029   CREATININE 1.24 08/23/2023 0955   CREATININE 1.3 (H) 04/08/2017 1141   CREATININE 1.1 10/29/2015 1029      Component Value Date/Time   CALCIUM 9.7 08/23/2023 0955   CALCIUM 9.3 04/08/2017 1141   CALCIUM 9.3 10/29/2015 1029   ALKPHOS 44 08/23/2023 0955   ALKPHOS 66 04/08/2017 1141   ALKPHOS 52 10/29/2015 1029   AST 14 (L) 08/23/2023 0955   AST 20 10/29/2015 1029   ALT 9 08/23/2023 0955   ALT 21 04/08/2017 1141   ALT 14 10/29/2015 1029   BILITOT 0.7 08/23/2023 0955   BILITOT 0.75 10/29/2015 1029       Impression and Plan: Mr. Grew is a very pleasant 89 yo African American gentleman with  relapsed IgG kappa myeloma. He has history of stem cell transplant in 2006 and since relapsed.    So far, things continue to do well for him.  Again, if he needs any intervention for this foot drop, I do not see a problem with this from a perspective of myeloma.  If he needs having an epidural injection, I do not see a problem with this.  He will continue on the Faspro.  He has done incredibly well on this.  We will plan to get back in 1 month.  Ivor Mars, MD 4/28/202511:14 AM

## 2023-09-20 NOTE — Progress Notes (Signed)
 Per Dr. Maria Shiner ok to treat with current BP and to proceed without CMP results.

## 2023-09-21 LAB — IGG, IGA, IGM
IgA: 24 mg/dL — ABNORMAL LOW (ref 61–437)
IgG (Immunoglobin G), Serum: 585 mg/dL — ABNORMAL LOW (ref 603–1613)
IgM (Immunoglobulin M), Srm: 73 mg/dL (ref 15–143)

## 2023-09-21 LAB — KAPPA/LAMBDA LIGHT CHAINS
Kappa free light chain: 13 mg/L (ref 3.3–19.4)
Kappa, lambda light chain ratio: 1.81 — ABNORMAL HIGH (ref 0.26–1.65)
Lambda free light chains: 7.2 mg/L (ref 5.7–26.3)

## 2023-09-22 ENCOUNTER — Other Ambulatory Visit: Payer: Self-pay

## 2023-09-22 LAB — PROTEIN ELECTROPHORESIS, SERUM, WITH REFLEX
A/G Ratio: 1.5 (ref 0.7–1.7)
Albumin ELP: 3.4 g/dL (ref 2.9–4.4)
Alpha-1-Globulin: 0.2 g/dL (ref 0.0–0.4)
Alpha-2-Globulin: 0.7 g/dL (ref 0.4–1.0)
Beta Globulin: 0.8 g/dL (ref 0.7–1.3)
Gamma Globulin: 0.5 g/dL (ref 0.4–1.8)
Globulin, Total: 2.3 g/dL (ref 2.2–3.9)
Total Protein ELP: 5.7 g/dL — ABNORMAL LOW (ref 6.0–8.5)

## 2023-10-08 ENCOUNTER — Other Ambulatory Visit: Payer: Self-pay

## 2023-10-19 ENCOUNTER — Inpatient Hospital Stay

## 2023-10-19 ENCOUNTER — Inpatient Hospital Stay (HOSPITAL_BASED_OUTPATIENT_CLINIC_OR_DEPARTMENT_OTHER): Admitting: Family

## 2023-10-19 ENCOUNTER — Encounter: Payer: Self-pay | Admitting: Family

## 2023-10-19 ENCOUNTER — Other Ambulatory Visit: Payer: Self-pay | Admitting: Hematology & Oncology

## 2023-10-19 ENCOUNTER — Inpatient Hospital Stay: Attending: Hematology & Oncology

## 2023-10-19 VITALS — BP 154/83 | HR 65 | Temp 98.0°F | Resp 18 | Wt 210.0 lb

## 2023-10-19 DIAGNOSIS — C9002 Multiple myeloma in relapse: Secondary | ICD-10-CM | POA: Insufficient documentation

## 2023-10-19 DIAGNOSIS — C9000 Multiple myeloma not having achieved remission: Secondary | ICD-10-CM

## 2023-10-19 DIAGNOSIS — Z79620 Long term (current) use of immunosuppressive biologic: Secondary | ICD-10-CM | POA: Insufficient documentation

## 2023-10-19 DIAGNOSIS — N183 Chronic kidney disease, stage 3 unspecified: Secondary | ICD-10-CM

## 2023-10-19 DIAGNOSIS — D631 Anemia in chronic kidney disease: Secondary | ICD-10-CM

## 2023-10-19 DIAGNOSIS — M898X9 Other specified disorders of bone, unspecified site: Secondary | ICD-10-CM

## 2023-10-19 DIAGNOSIS — Z5112 Encounter for antineoplastic immunotherapy: Secondary | ICD-10-CM | POA: Insufficient documentation

## 2023-10-19 LAB — CMP (CANCER CENTER ONLY)
ALT: 9 U/L (ref 0–44)
AST: 15 U/L (ref 15–41)
Albumin: 4.2 g/dL (ref 3.5–5.0)
Alkaline Phosphatase: 43 U/L (ref 38–126)
Anion gap: 6 (ref 5–15)
BUN: 26 mg/dL — ABNORMAL HIGH (ref 8–23)
CO2: 29 mmol/L (ref 22–32)
Calcium: 10 mg/dL (ref 8.9–10.3)
Chloride: 103 mmol/L (ref 98–111)
Creatinine: 1.3 mg/dL — ABNORMAL HIGH (ref 0.61–1.24)
GFR, Estimated: 53 mL/min — ABNORMAL LOW (ref >60)
Glucose, Bld: 105 mg/dL — ABNORMAL HIGH (ref 70–99)
Potassium: 3.6 mmol/L (ref 3.5–5.1)
Sodium: 138 mmol/L (ref 135–145)
Total Bilirubin: 0.8 mg/dL (ref 0.0–1.2)
Total Protein: 5.8 g/dL — ABNORMAL LOW (ref 6.5–8.1)

## 2023-10-19 LAB — CBC WITH DIFFERENTIAL (CANCER CENTER ONLY)
Abs Immature Granulocytes: 0.03 10*3/uL (ref 0.00–0.07)
Basophils Absolute: 0 10*3/uL (ref 0.0–0.1)
Basophils Relative: 1 %
Eosinophils Absolute: 0.2 10*3/uL (ref 0.0–0.5)
Eosinophils Relative: 3 %
HCT: 33.3 % — ABNORMAL LOW (ref 39.0–52.0)
Hemoglobin: 11.1 g/dL — ABNORMAL LOW (ref 13.0–17.0)
Immature Granulocytes: 1 %
Lymphocytes Relative: 40 %
Lymphs Abs: 2.4 10*3/uL (ref 0.7–4.0)
MCH: 30.7 pg (ref 26.0–34.0)
MCHC: 33.3 g/dL (ref 30.0–36.0)
MCV: 92.2 fL (ref 80.0–100.0)
Monocytes Absolute: 0.5 10*3/uL (ref 0.1–1.0)
Monocytes Relative: 8 %
Neutro Abs: 2.9 10*3/uL (ref 1.7–7.7)
Neutrophils Relative %: 47 %
Platelet Count: 192 10*3/uL (ref 150–400)
RBC: 3.61 MIL/uL — ABNORMAL LOW (ref 4.22–5.81)
RDW: 13.4 % (ref 11.5–15.5)
WBC Count: 6.1 10*3/uL (ref 4.0–10.5)
nRBC: 0 % (ref 0.0–0.2)

## 2023-10-19 LAB — LACTATE DEHYDROGENASE: LDH: 197 U/L — ABNORMAL HIGH (ref 98–192)

## 2023-10-19 MED ORDER — DEXAMETHASONE 4 MG PO TABS
20.0000 mg | ORAL_TABLET | Freq: Once | ORAL | Status: AC
  Administered 2023-10-19: 20 mg via ORAL
  Filled 2023-10-19: qty 5

## 2023-10-19 MED ORDER — HEPARIN SOD (PORK) LOCK FLUSH 100 UNIT/ML IV SOLN
500.0000 [IU] | Freq: Once | INTRAVENOUS | Status: DC
  Administered 2023-10-19: 500 [IU] via INTRAVENOUS

## 2023-10-19 MED ORDER — SODIUM CHLORIDE 0.9 % IV SOLN: Freq: Once | INTRAVENOUS | Status: AC

## 2023-10-19 MED ORDER — SODIUM CHLORIDE 0.9% FLUSH
10.0000 mL | INTRAVENOUS | Status: DC | PRN
  Administered 2023-10-19: 10 mL via INTRAVENOUS

## 2023-10-19 MED ORDER — ZOLEDRONIC ACID 4 MG/100ML IV SOLN
4.0000 mg | Freq: Once | INTRAVENOUS | Status: AC
  Administered 2023-10-19: 4 mg via INTRAVENOUS
  Filled 2023-10-19: qty 100

## 2023-10-19 MED ORDER — DARATUMUMAB-HYALURONIDASE-FIHJ 1800-30000 MG-UT/15ML ~~LOC~~ SOLN
1800.0000 mg | Freq: Once | SUBCUTANEOUS | Status: AC
  Administered 2023-10-19: 1800 mg via SUBCUTANEOUS
  Filled 2023-10-19: qty 15

## 2023-10-19 MED ORDER — DIPHENHYDRAMINE HCL 25 MG PO CAPS
50.0000 mg | ORAL_CAPSULE | Freq: Once | ORAL | Status: AC
  Administered 2023-10-19: 50 mg via ORAL
  Filled 2023-10-19: qty 2

## 2023-10-19 MED ORDER — ACETAMINOPHEN 325 MG PO TABS
650.0000 mg | ORAL_TABLET | Freq: Once | ORAL | Status: AC
  Administered 2023-10-19: 650 mg via ORAL
  Filled 2023-10-19: qty 2

## 2023-10-19 NOTE — Patient Instructions (Signed)
 [facility] - A DEPT OF [facility]  Discharge Instructions: Thank you for choosing [facility] to provide your oncology and hematology care.   If you have a lab appointment with the Cancer Center, please go directly to the Cancer Center and check in at the registration area.  Wear comfortable clothing and clothing appropriate for easy access to any Portacath or PICC line.   We strive to give you quality time with your provider. You may need to reschedule your appointment if you arrive late (15 or more minutes).  Arriving late affects you and other patients whose appointments are after yours.  Also, if you miss three or more appointments without notifying the office, you may be dismissed from the clinic at the provider's discretion.      For prescription refill requests, have your pharmacy contact our office and allow 72 hours for refills to be completed.    Today you received the following chemotherapy and/or immunotherapy agents faspro, Zometa        To help prevent nausea and vomiting after your treatment, we encourage you to take your nausea medication as directed.  BELOW ARE SYMPTOMS THAT SHOULD BE REPORTED IMMEDIATELY: *FEVER GREATER THAN 100.4 F (38 C) OR HIGHER *CHILLS OR SWEATING *NAUSEA AND VOMITING THAT IS NOT CONTROLLED WITH YOUR NAUSEA MEDICATION *UNUSUAL SHORTNESS OF BREATH *UNUSUAL BRUISING OR BLEEDING *URINARY PROBLEMS (pain or burning when urinating, or frequent urination) *BOWEL PROBLEMS (unusual diarrhea, constipation, pain near the anus) TENDERNESS IN MOUTH AND THROAT WITH OR WITHOUT PRESENCE OF ULCERS (sore throat, sores in mouth, or a toothache) UNUSUAL RASH, SWELLING OR PAIN  UNUSUAL VAGINAL DISCHARGE OR ITCHING   Items with * indicate a potential emergency and should be followed up as soon as possible or go to the Emergency Department if any problems should occur.  Please show the CHEMOTHERAPY ALERT CARD or  IMMUNOTHERAPY ALERT CARD at check-in to the Emergency Department and triage nurse. Should you have questions after your visit or need to cancel or reschedule your appointment, please contact [facility] - A DEPT OF [facility]  [phone] and follow the prompts.  Office hours are 8:00 a.m. to 4:30 p.m. Monday - Friday. Please note that voicemails left after 4:00 p.m. may not be returned until the following business day.  We are closed weekends and major holidays. You have access to a nurse at all times for urgent questions. Please call the main number to the clinic [phone] and follow the prompts.  For any non-urgent questions, you may also contact your provider using MyChart. We now offer e-Visits for anyone 25 and older to request care online for non-urgent symptoms. For details visit [email or web address].   Also download the MyChart app! Go to the app store, search "MyChart", open the app, select [place], and log in with your MyChart username and password.

## 2023-10-19 NOTE — Progress Notes (Signed)
 Hematology and Oncology Follow Up Visit  Robert [name] [id number] November 25, 1933 89 y.o. 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 He is doing well overall but has chronic lower back pain due to a bulging disc that mostly effects the left side and sciatica in the left leg.  He is ambulating with a cane for added support.  No falls or syncope reported.  Last month no M-spike was detected, IgG level was 585 mg/dL and kappa light chains were 1.30 mg/dL.  No issue with fever, chills, n/v, cough, rash, dizziness, SOB, chest pain, palpitations, abdominal pain or changes in bowel or bladder habits.  No swelling, numbness or tingling in his extremities at this time.  Appetite and hydration are good. Weight is stable at 210 lbs.   ECOG Performance Status: 1 - Symptomatic but completely ambulatory  Medications:  Allergies as of 10/19/2023   No Known Allergies      Medication List        Accurate as of Oct 19, 2023  9:55 AM. If you have any questions, ask your nurse or doctor.          acetaminophen  650 MG CR tablet Commonly known as: TYLENOL  Take 650 mg by mouth every 8 (eight) hours as needed for pain.   aspirin  81 MG tablet Take 81 mg by mouth daily.   bimatoprost  0.01 % Soln Commonly known as: LUMIGAN  Place 1 drop into both eyes at bedtime.   CALTRATE 600+D PLUS PO Take 1 tablet by mouth daily.   carvedilol  25 MG tablet Commonly known as:  COREG  Take 25 mg by mouth 2 (two) times daily.   cloNIDine  0.1 MG tablet Commonly known as: CATAPRES  Take 0.1 mg by mouth 2 (two) times daily as needed (Elevated BP).   dexamethasone  4 MG tablet Commonly known as: DECADRON  Take 5 tablets prior to immunotherapy treatment What changed:  how much to take how to take this when to take this   diclofenac Sodium 1 % Gel Commonly known as: VOLTAREN Apply 2 g topically 4 (four) times daily. As needed   famciclovir  250 MG tablet Commonly known as: FAMVIR  TAKE 1 TABLET DAILY   losartan  100 MG tablet Commonly known as: COZAAR  Take 100 mg by mouth daily.   meloxicam 15 MG tablet Commonly known as: MOBIC Take 1 tablet by mouth daily as needed for pain.   metolazone  5 MG tablet Commonly known as: ZAROXOLYN  Take 5 mg by mouth daily.   montelukast  10 MG tablet Commonly known as: SINGULAIR  TAKE 1 TABLET AT BEDTIME   potassium chloride  SA 20 MEQ tablet Commonly known as: KLOR-CON  M Take 1 tablet (20 mEq total) by mouth daily.   rosuvastatin 20 MG tablet Commonly known as: CRESTOR Take 20 mg by mouth at bedtime.   SYSTANE OP Place 1 drop into both eyes daily as needed (dry eyes).  Zoledronic  Acid 4 MG/100ML IVPB Commonly known as: ZOMETA  Inject 4 mg into the vein every 30 (thirty) days. 07/03/2022 Every 4 months.        Allergies: No Known Allergies  Past Medical History, Surgical history, Social history, and Family History were reviewed and updated.  Review of Systems: All other 10 point review of systems is negative.   Physical Exam:  vitals were not taken for this visit.   Wt Readings from Last 3 Encounters:  09/20/23 213 lb 6.4 oz (96.8 kg)  08/23/23 212 lb (96.2 kg)  07/26/23 208 lb (94.3 kg)    Ocular: Sclerae unicteric, pupils equal, round and reactive to light Ear-nose-throat: Oropharynx clear, dentition fair Lymphatic: No cervical or supraclavicular adenopathy Lungs no rales or rhonchi, good excursion  bilaterally Heart regular rate and rhythm, no murmur appreciated Abd soft, nontender, positive bowel sounds MSK no focal spinal tenderness, no joint edema Neuro: non-focal, well-oriented, appropriate affect Breasts: Deferred   Lab Results  Component Value Date   WBC 5.8 09/20/2023   HGB 11.0 (L) 09/20/2023   HCT 33.4 (L) 09/20/2023   MCV 93.3 09/20/2023   PLT 185 09/20/2023   Lab Results  Component Value Date   FERRITIN 555 (H) 07/23/2021   IRON 84 07/23/2021   TIBC 325 07/23/2021   UIBC 241 07/23/2021   IRONPCTSAT 26 07/23/2021   Lab Results  Component Value Date   RETICCTPCT 2.2 04/05/2019   RBC 3.58 (L) 09/20/2023   Lab Results  Component Value Date   KPAFRELGTCHN 13.0 09/20/2023   LAMBDASER 7.2 09/20/2023   KAPLAMBRATIO 1.81 (H) 09/20/2023   Lab Results  Component Value Date   IGGSERUM 585 (L) 09/20/2023   IGA 24 (L) 09/20/2023   IGMSERUM 73 09/20/2023   Lab Results  Component Value Date   TOTALPROTELP 5.7 (L) 09/20/2023   ALBUMINELP 3.4 09/20/2023   A1GS 0.2 09/20/2023   A2GS 0.7 09/20/2023   BETS 0.8 09/20/2023   BETA2SER 0.3 01/03/2015   GAMS 0.5 09/20/2023   MSPIKE Not Observed 09/20/2023   SPEI Comment 08/23/2023     Chemistry      Component Value Date/Time   NA 139 09/20/2023 0953   NA 137 04/08/2017 1141   NA 138 10/29/2015 1029   K 3.8 09/20/2023 0953   K 3.4 04/08/2017 1141   K 3.9 10/29/2015 1029   CL 104 09/20/2023 0953   CL 102 04/08/2017 1141   CO2 23 09/20/2023 0953   CO2 30 04/08/2017 1141   CO2 27 10/29/2015 1029   BUN 21 09/20/2023 0953   BUN 16 04/08/2017 1141   BUN 13.8 10/29/2015 1029   CREATININE 1.27 (H) 09/20/2023 0953   CREATININE 1.3 (H) 04/08/2017 1141   CREATININE 1.1 10/29/2015 1029      Component Value Date/Time   CALCIUM 10.2 09/20/2023 0953   CALCIUM 9.3 04/08/2017 1141   CALCIUM 9.3 10/29/2015 1029   ALKPHOS 50 09/20/2023 0953   ALKPHOS 66 04/08/2017 1141   ALKPHOS 52 10/29/2015 1029   AST 21  09/20/2023 0953   AST 20 10/29/2015 1029   ALT 10 09/20/2023 0953   ALT 21 04/08/2017 1141   ALT 14 10/29/2015 1029   BILITOT 0.7 09/20/2023 0953   BILITOT 0.75 10/29/2015 1029       Impression and Plan:  [name] is a very pleasant 89 yo African American gentleman with relapsed IgG kappa myeloma. He has history of stem cell transplant in 2006 and since relapsed.  We will proceed with treatment today as planned including Zometa .  Myeloma studies are pending.  Follow-up in 1 month.   Kennard Pea, NP 5/27/20259:55 AM

## 2023-10-19 NOTE — Patient Instructions (Signed)

## 2023-10-20 ENCOUNTER — Encounter: Payer: Self-pay | Admitting: Hematology & Oncology

## 2023-10-20 LAB — KAPPA/LAMBDA LIGHT CHAINS
Kappa free light chain: 14.6 mg/L (ref 3.3–19.4)
Kappa, lambda light chain ratio: 1.83 — ABNORMAL HIGH (ref 0.26–1.65)
Lambda free light chains: 8 mg/L (ref 5.7–26.3)

## 2023-10-20 LAB — IGG, IGA, IGM
IgA: 27 mg/dL — ABNORMAL LOW (ref 61–437)
IgG (Immunoglobin G), Serum: 588 mg/dL — ABNORMAL LOW (ref 603–1613)
IgM (Immunoglobulin M), Srm: 72 mg/dL (ref 15–143)

## 2023-10-21 ENCOUNTER — Other Ambulatory Visit: Payer: Self-pay

## 2023-10-21 LAB — PROTEIN ELECTROPHORESIS, SERUM, WITH REFLEX
A/G Ratio: 1.6 (ref 0.7–1.7)
Albumin ELP: 3.7 g/dL (ref 2.9–4.4)
Alpha-1-Globulin: 0.2 g/dL (ref 0.0–0.4)
Alpha-2-Globulin: 0.9 g/dL (ref 0.4–1.0)
Beta Globulin: 0.7 g/dL (ref 0.7–1.3)
Gamma Globulin: 0.5 g/dL (ref 0.4–1.8)
Globulin, Total: 2.3 g/dL (ref 2.2–3.9)
Total Protein ELP: 6 g/dL (ref 6.0–8.5)

## 2023-11-16 ENCOUNTER — Ambulatory Visit

## 2023-11-16 ENCOUNTER — Ambulatory Visit: Admitting: Family

## 2023-11-16 ENCOUNTER — Other Ambulatory Visit: Payer: Self-pay

## 2023-11-16 ENCOUNTER — Other Ambulatory Visit

## 2023-11-17 ENCOUNTER — Other Ambulatory Visit: Payer: Self-pay

## 2023-11-19 ENCOUNTER — Inpatient Hospital Stay: Attending: Hematology & Oncology

## 2023-11-19 ENCOUNTER — Encounter: Payer: Self-pay | Admitting: Family

## 2023-11-19 ENCOUNTER — Inpatient Hospital Stay

## 2023-11-19 ENCOUNTER — Inpatient Hospital Stay (HOSPITAL_BASED_OUTPATIENT_CLINIC_OR_DEPARTMENT_OTHER): Admitting: Family

## 2023-11-19 VITALS — BP 140/82 | HR 69 | Temp 97.8°F | Resp 18 | Wt 206.0 lb

## 2023-11-19 VITALS — BP 180/82 | HR 58 | Resp 18

## 2023-11-19 DIAGNOSIS — D649 Anemia, unspecified: Secondary | ICD-10-CM

## 2023-11-19 DIAGNOSIS — Z9484 Stem cells transplant status: Secondary | ICD-10-CM | POA: Insufficient documentation

## 2023-11-19 DIAGNOSIS — I129 Hypertensive chronic kidney disease with stage 1 through stage 4 chronic kidney disease, or unspecified chronic kidney disease: Secondary | ICD-10-CM | POA: Diagnosis not present

## 2023-11-19 DIAGNOSIS — N183 Chronic kidney disease, stage 3 unspecified: Secondary | ICD-10-CM | POA: Diagnosis not present

## 2023-11-19 DIAGNOSIS — Z5112 Encounter for antineoplastic immunotherapy: Secondary | ICD-10-CM | POA: Insufficient documentation

## 2023-11-19 DIAGNOSIS — M898X9 Other specified disorders of bone, unspecified site: Secondary | ICD-10-CM

## 2023-11-19 DIAGNOSIS — C9000 Multiple myeloma not having achieved remission: Secondary | ICD-10-CM

## 2023-11-19 DIAGNOSIS — Z79620 Long term (current) use of immunosuppressive biologic: Secondary | ICD-10-CM | POA: Diagnosis not present

## 2023-11-19 DIAGNOSIS — D631 Anemia in chronic kidney disease: Secondary | ICD-10-CM | POA: Insufficient documentation

## 2023-11-19 DIAGNOSIS — C9002 Multiple myeloma in relapse: Secondary | ICD-10-CM | POA: Diagnosis present

## 2023-11-19 LAB — CBC WITH DIFFERENTIAL (CANCER CENTER ONLY)
Abs Immature Granulocytes: 0.02 10*3/uL (ref 0.00–0.07)
Basophils Absolute: 0 10*3/uL (ref 0.0–0.1)
Basophils Relative: 1 %
Eosinophils Absolute: 0.1 10*3/uL (ref 0.0–0.5)
Eosinophils Relative: 2 %
HCT: 27.7 % — ABNORMAL LOW (ref 39.0–52.0)
Hemoglobin: 9.1 g/dL — ABNORMAL LOW (ref 13.0–17.0)
Immature Granulocytes: 0 %
Lymphocytes Relative: 41 %
Lymphs Abs: 2.3 10*3/uL (ref 0.7–4.0)
MCH: 30.2 pg (ref 26.0–34.0)
MCHC: 32.9 g/dL (ref 30.0–36.0)
MCV: 92 fL (ref 80.0–100.0)
Monocytes Absolute: 0.5 10*3/uL (ref 0.1–1.0)
Monocytes Relative: 8 %
Neutro Abs: 2.7 10*3/uL (ref 1.7–7.7)
Neutrophils Relative %: 48 %
Platelet Count: 182 10*3/uL (ref 150–400)
RBC: 3.01 MIL/uL — ABNORMAL LOW (ref 4.22–5.81)
RDW: 13.8 % (ref 11.5–15.5)
WBC Count: 5.6 10*3/uL (ref 4.0–10.5)
nRBC: 0 % (ref 0.0–0.2)

## 2023-11-19 LAB — LACTATE DEHYDROGENASE: LDH: 229 U/L — ABNORMAL HIGH (ref 98–192)

## 2023-11-19 LAB — CMP (CANCER CENTER ONLY)
ALT: 11 U/L (ref 0–44)
AST: 14 U/L — ABNORMAL LOW (ref 15–41)
Albumin: 4.1 g/dL (ref 3.5–5.0)
Alkaline Phosphatase: 45 U/L (ref 38–126)
Anion gap: 8 (ref 5–15)
BUN: 27 mg/dL — ABNORMAL HIGH (ref 8–23)
CO2: 27 mmol/L (ref 22–32)
Calcium: 10 mg/dL (ref 8.9–10.3)
Chloride: 103 mmol/L (ref 98–111)
Creatinine: 1.52 mg/dL — ABNORMAL HIGH (ref 0.61–1.24)
GFR, Estimated: 44 mL/min — ABNORMAL LOW (ref >60)
Glucose, Bld: 103 mg/dL — ABNORMAL HIGH (ref 70–99)
Potassium: 3.3 mmol/L — ABNORMAL LOW (ref 3.5–5.1)
Sodium: 138 mmol/L (ref 135–145)
Total Bilirubin: 0.7 mg/dL (ref 0.0–1.2)
Total Protein: 5.9 g/dL — ABNORMAL LOW (ref 6.5–8.1)

## 2023-11-19 LAB — IRON AND IRON BINDING CAPACITY (CC-WL,HP ONLY)
Iron: 71 ug/dL (ref 45–182)
Saturation Ratios: 23 % (ref 17.9–39.5)
TIBC: 305 ug/dL (ref 250–450)
UIBC: 234 ug/dL (ref 117–376)

## 2023-11-19 LAB — FERRITIN: Ferritin: 802 ng/mL — ABNORMAL HIGH (ref 24–336)

## 2023-11-19 MED ORDER — DARATUMUMAB-HYALURONIDASE-FIHJ 1800-30000 MG-UT/15ML ~~LOC~~ SOLN
1800.0000 mg | Freq: Once | SUBCUTANEOUS | Status: AC
  Administered 2023-11-19: 1800 mg via SUBCUTANEOUS
  Filled 2023-11-19: qty 15

## 2023-11-19 MED ORDER — DEXAMETHASONE 4 MG PO TABS
20.0000 mg | ORAL_TABLET | Freq: Once | ORAL | Status: AC
  Administered 2023-11-19: 20 mg via ORAL
  Filled 2023-11-19: qty 5

## 2023-11-19 MED ORDER — DIPHENHYDRAMINE HCL 25 MG PO CAPS
50.0000 mg | ORAL_CAPSULE | Freq: Once | ORAL | Status: AC
  Administered 2023-11-19: 50 mg via ORAL
  Filled 2023-11-19: qty 2

## 2023-11-19 MED ORDER — ACETAMINOPHEN 325 MG PO TABS
650.0000 mg | ORAL_TABLET | Freq: Once | ORAL | Status: AC
  Administered 2023-11-19: 650 mg via ORAL
  Filled 2023-11-19: qty 2

## 2023-11-19 MED ORDER — SODIUM CHLORIDE 0.9% FLUSH
10.0000 mL | INTRAVENOUS | Status: DC | PRN
  Administered 2023-11-19: 10 mL

## 2023-11-19 MED ORDER — HEPARIN SOD (PORK) LOCK FLUSH 100 UNIT/ML IV SOLN
500.0000 [IU] | Freq: Once | INTRAVENOUS | Status: AC | PRN
  Administered 2023-11-19: 500 [IU]

## 2023-11-19 MED ORDER — DARBEPOETIN ALFA 300 MCG/0.6ML IJ SOSY
300.0000 ug | PREFILLED_SYRINGE | Freq: Once | INTRAMUSCULAR | Status: AC
  Administered 2023-11-19: 300 ug via SUBCUTANEOUS
  Filled 2023-11-19: qty 0.6

## 2023-11-19 NOTE — Patient Instructions (Signed)
 [facility] - A DEPT OF [facility]  Discharge Instructions: Thank you for choosing [facility] to provide your oncology and hematology care.   If you have a lab appointment with the Cancer Center, please go directly to the Cancer Center and check in at the registration area.  Wear comfortable clothing and clothing appropriate for easy access to any Portacath or PICC line.   We strive to give you quality time with your provider. You may need to reschedule your appointment if you arrive late (15 or more minutes).  Arriving late affects you and other patients whose appointments are after yours.  Also, if you miss three or more appointments without notifying the office, you may be dismissed from the clinic at the provider's discretion.      For prescription refill requests, have your pharmacy contact our office and allow 72 hours for refills to be completed.    Today you received the following chemotherapy and/or immunotherapy agents Darzalex.      To help prevent nausea and vomiting after your treatment, we encourage you to take your nausea medication as directed.  BELOW ARE SYMPTOMS THAT SHOULD BE REPORTED IMMEDIATELY: *FEVER GREATER THAN 100.4 F (38 C) OR HIGHER *CHILLS OR SWEATING *NAUSEA AND VOMITING THAT IS NOT CONTROLLED WITH YOUR NAUSEA MEDICATION *UNUSUAL SHORTNESS OF BREATH *UNUSUAL BRUISING OR BLEEDING *URINARY PROBLEMS (pain or burning when urinating, or frequent urination) *BOWEL PROBLEMS (unusual diarrhea, constipation, pain near the anus) TENDERNESS IN MOUTH AND THROAT WITH OR WITHOUT PRESENCE OF ULCERS (sore throat, sores in mouth, or a toothache) UNUSUAL RASH, SWELLING OR PAIN  UNUSUAL VAGINAL DISCHARGE OR ITCHING   Items with * indicate a potential emergency and should be followed up as soon as possible or go to the Emergency Department if any problems should occur.  Please show the CHEMOTHERAPY ALERT CARD or IMMUNOTHERAPY  ALERT CARD at check-in to the Emergency Department and triage nurse. Should you have questions after your visit or need to cancel or reschedule your appointment, please contact [facility] - A DEPT OF [facility]  [phone] and follow the prompts.  Office hours are 8:00 a.m. to 4:30 p.m. Monday - Friday. Please note that voicemails left after 4:00 p.m. may not be returned until the following business day.  We are closed weekends and major holidays. You have access to a nurse at all times for urgent questions. Please call the main number to the clinic [phone] and follow the prompts.  For any non-urgent questions, you may also contact your provider using MyChart. We now offer e-Visits for anyone 65 and older to request care online for non-urgent symptoms. For details visit [email or web address].   Also download the MyChart app! Go to the app store, search "MyChart", open the app, select [facility], and log in with your MyChart username and password.

## 2023-11-19 NOTE — Progress Notes (Signed)
 Hematology and Oncology Follow Up Visit  Robert [name] [id number] 18-Apr-1934 89 y.o. 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 He is doing quite well and has no new complaints at this time.  He is still having pain in the lower back with sciatica and is seeing and orthopedic surgeon. Meloxicam has helped.  His Hgb is down fro, 11.1 to 9.1. I added iron studies to his labs to see if he needs replacement. He denies fatigue at this time.  No fever, chills, n/v, cough, rash, dizziness, SOB, chest pain, palpitations, abdominal pain or changes in bowel or bladder habits.  No M-spike observed last month. IgG level was 588 mg/dL and kappa light chains 1.46 mg/dL.  No swelling in his extremities.  Swelling in the joints with arthritis.  Numbness and tingling in the middle and pointer fingers of the right hands unchanged.  No falls or syncope reported. He ambulates with a cane for added support.  Appetite and hydration are good. Weight is stable at 206 lbs.    ECOG Performance Status: 1 - Symptomatic but completely ambulatory  Medications:  Allergies as of 11/19/2023   No Known Allergies      Medication List        Accurate as of November 19, 2023  9:11 AM. If you have any questions, ask your nurse or doctor.          acetaminophen  650 MG CR tablet Commonly known as: TYLENOL  Take 650 mg by mouth every 8 (eight) hours as needed for pain.   aspirin  81 MG tablet Take 81 mg  by mouth daily.   bimatoprost  0.01 % Soln Commonly known as: LUMIGAN  Place 1 drop into both eyes at bedtime.   CALTRATE 600+D PLUS PO Take 1 tablet by mouth daily.   carvedilol  25 MG tablet Commonly known as: COREG  Take 25 mg by mouth 2 (two) times daily.   cloNIDine  0.1 MG tablet Commonly known as: CATAPRES  Take 0.1 mg by mouth 2 (two) times daily as needed (Elevated BP).   dexamethasone  4 MG tablet Commonly known as: DECADRON  Take 5 tablets prior to immunotherapy treatment What changed:  how much to take how to take this when to take this   diclofenac Sodium 1 % Gel Commonly known as: VOLTAREN Apply 2 g topically 4 (four) times daily. As needed   famciclovir  250 MG tablet Commonly known as: FAMVIR  TAKE 1 TABLET DAILY   losartan  100 MG tablet Commonly known as: COZAAR  Take 100 mg by mouth daily.   meloxicam 15 MG tablet Commonly known as: MOBIC Take 1 tablet by mouth daily as needed for pain.   metolazone  5 MG tablet Commonly known as: ZAROXOLYN  Take 5 mg by mouth daily.   montelukast  10 MG tablet Commonly known as: SINGULAIR  TAKE 1 TABLET AT BEDTIME   potassium chloride  SA 20 MEQ tablet Commonly known as: KLOR-CON  M  Take 1 tablet (20 mEq total) by mouth daily.   rosuvastatin 20 MG tablet Commonly known as: CRESTOR Take 20 mg by mouth at bedtime.   SYSTANE OP Place 1 drop into both eyes daily as needed (dry eyes).   Zoledronic  Acid 4 MG/100ML IVPB Commonly known as: ZOMETA  Inject 4 mg into the vein every 30 (thirty) days. 07/03/2022 Every 4 months.        Allergies: No Known Allergies  Past Medical History, Surgical history, Social history, and Family History were reviewed and updated.  Review of Systems: All other 10 point review of systems is negative.   Physical Exam:  weight is 206 lb (93.4 kg). His oral temperature is 97.8 F (36.6 C). His blood pressure is 140/82 (abnormal) and his pulse is 69. His respiration is 18 and oxygen  saturation is 100%.   Wt Readings from Last 3 Encounters:  11/19/23 206 lb (93.4 kg)  10/19/23 210 lb (95.3 kg)  09/20/23 213 lb 6.4 oz (96.8 kg)    Ocular: Sclerae unicteric, pupils equal, round and reactive to light Ear-nose-throat: Oropharynx clear, dentition fair Lymphatic: No cervical or supraclavicular adenopathy Lungs no rales or rhonchi, good excursion bilaterally Heart regular rate and rhythm, no murmur appreciated Abd soft, nontender, positive bowel sounds MSK no focal spinal tenderness, no joint edema Neuro: non-focal, well-oriented, appropriate affect Breasts: Deferred  Lab Results  Component Value Date   WBC 5.6 11/19/2023   HGB 9.1 (L) 11/19/2023   HCT 27.7 (L) 11/19/2023   MCV 92.0 11/19/2023   PLT 182 11/19/2023   Lab Results  Component Value Date   FERRITIN 555 (H) 07/23/2021   IRON 84 07/23/2021   TIBC 325 07/23/2021   UIBC 241 07/23/2021   IRONPCTSAT 26 07/23/2021   Lab Results  Component Value Date   RETICCTPCT 2.2 04/05/2019   RBC 3.01 (L) 11/19/2023   Lab Results  Component Value Date   KPAFRELGTCHN 14.6 10/19/2023   LAMBDASER 8.0 10/19/2023   KAPLAMBRATIO 1.83 (H) 10/19/2023   Lab Results  Component Value Date   IGGSERUM 588 (L) 10/19/2023   IGA 27 (L) 10/19/2023   IGMSERUM 72 10/19/2023   Lab Results  Component Value Date   TOTALPROTELP 6.0 10/19/2023   ALBUMINELP 3.7 10/19/2023   A1GS 0.2 10/19/2023   A2GS 0.9 10/19/2023   BETS 0.7 10/19/2023   BETA2SER 0.3 01/03/2015   GAMS 0.5 10/19/2023   MSPIKE Not Observed 10/19/2023   SPEI Comment 08/23/2023     Chemistry      Component Value Date/Time   NA 138 11/19/2023 0805   NA 137 04/08/2017 1141   NA 138 10/29/2015 1029   K 3.3 (L) 11/19/2023 0805   K 3.4 04/08/2017 1141   K 3.9 10/29/2015 1029   CL 103 11/19/2023 0805   CL 102 04/08/2017 1141   CO2 27 11/19/2023 0805   CO2 30 04/08/2017 1141   CO2 27 10/29/2015 1029   BUN 27 (H) 11/19/2023 0805   BUN 16 04/08/2017 1141    BUN 13.8 10/29/2015 1029   CREATININE 1.52 (H) 11/19/2023 0805   CREATININE 1.3 (H) 04/08/2017 1141   CREATININE 1.1 10/29/2015 1029      Component Value Date/Time   CALCIUM 10.0 11/19/2023 0805   CALCIUM 9.3 04/08/2017 1141   CALCIUM 9.3 10/29/2015 1029   ALKPHOS 45 11/19/2023 0805   ALKPHOS 66 04/08/2017 1141   ALKPHOS 52 10/29/2015 1029   AST 14 (L) 11/19/2023 0805   AST 20 10/29/2015  1029   ALT 11 11/19/2023 0805   ALT 21 04/08/2017 1141   ALT 14 10/29/2015 1029   BILITOT 0.7 11/19/2023 0805   BILITOT 0.75 10/29/2015 1029       Impression and Plan: Mr. Daniello is a very pleasant 89 yo African American gentleman with relapsed IgG kappa myeloma. He has history of stem cell transplant in 2006 and since relapsed.    We will proceed with treatment today as planned.  ESA injection given.  Myeloma and iron studies are pending. We will set him up for IV iron if needed.  Follow-up in 1 month.   Lauraine Pepper, NP 6/27/20259:11 AM

## 2023-11-20 ENCOUNTER — Other Ambulatory Visit: Payer: Self-pay

## 2023-11-20 LAB — IGG, IGA, IGM
IgA: 32 mg/dL — ABNORMAL LOW (ref 61–437)
IgG (Immunoglobin G), Serum: 569 mg/dL — ABNORMAL LOW (ref 603–1613)
IgM (Immunoglobulin M), Srm: 74 mg/dL (ref 15–143)

## 2023-11-22 LAB — PROTEIN ELECTROPHORESIS, SERUM
A/G Ratio: 1.4 (ref 0.7–1.7)
Albumin ELP: 3.4 g/dL (ref 2.9–4.4)
Alpha-1-Globulin: 0.2 g/dL (ref 0.0–0.4)
Alpha-2-Globulin: 1 g/dL (ref 0.4–1.0)
Beta Globulin: 0.7 g/dL (ref 0.7–1.3)
Gamma Globulin: 0.6 g/dL (ref 0.4–1.8)
Globulin, Total: 2.4 g/dL (ref 2.2–3.9)
Total Protein ELP: 5.8 g/dL — ABNORMAL LOW (ref 6.0–8.5)

## 2023-11-22 LAB — KAPPA/LAMBDA LIGHT CHAINS
Kappa free light chain: 13.2 mg/L (ref 3.3–19.4)
Kappa, lambda light chain ratio: 1.81 — ABNORMAL HIGH (ref 0.26–1.65)
Lambda free light chains: 7.3 mg/L (ref 5.7–26.3)

## 2023-12-12 ENCOUNTER — Other Ambulatory Visit: Payer: Self-pay

## 2023-12-17 ENCOUNTER — Other Ambulatory Visit: Payer: Self-pay

## 2023-12-17 ENCOUNTER — Inpatient Hospital Stay

## 2023-12-17 ENCOUNTER — Inpatient Hospital Stay: Attending: Hematology & Oncology

## 2023-12-17 ENCOUNTER — Inpatient Hospital Stay (HOSPITAL_BASED_OUTPATIENT_CLINIC_OR_DEPARTMENT_OTHER): Admitting: Family

## 2023-12-17 ENCOUNTER — Encounter: Payer: Self-pay | Admitting: Family

## 2023-12-17 VITALS — BP 161/89 | HR 61 | Temp 98.1°F | Resp 19

## 2023-12-17 VITALS — BP 192/89 | HR 59

## 2023-12-17 DIAGNOSIS — C9000 Multiple myeloma not having achieved remission: Secondary | ICD-10-CM

## 2023-12-17 DIAGNOSIS — Z5112 Encounter for antineoplastic immunotherapy: Secondary | ICD-10-CM | POA: Insufficient documentation

## 2023-12-17 DIAGNOSIS — D630 Anemia in neoplastic disease: Secondary | ICD-10-CM | POA: Diagnosis not present

## 2023-12-17 DIAGNOSIS — Z79620 Long term (current) use of immunosuppressive biologic: Secondary | ICD-10-CM | POA: Diagnosis not present

## 2023-12-17 DIAGNOSIS — D649 Anemia, unspecified: Secondary | ICD-10-CM

## 2023-12-17 DIAGNOSIS — D631 Anemia in chronic kidney disease: Secondary | ICD-10-CM | POA: Diagnosis not present

## 2023-12-17 DIAGNOSIS — M898X9 Other specified disorders of bone, unspecified site: Secondary | ICD-10-CM | POA: Diagnosis not present

## 2023-12-17 DIAGNOSIS — Z95828 Presence of other vascular implants and grafts: Secondary | ICD-10-CM

## 2023-12-17 DIAGNOSIS — C9002 Multiple myeloma in relapse: Secondary | ICD-10-CM | POA: Insufficient documentation

## 2023-12-17 DIAGNOSIS — N183 Chronic kidney disease, stage 3 unspecified: Secondary | ICD-10-CM

## 2023-12-17 DIAGNOSIS — K909 Intestinal malabsorption, unspecified: Secondary | ICD-10-CM

## 2023-12-17 LAB — CBC WITH DIFFERENTIAL (CANCER CENTER ONLY)
Abs Immature Granulocytes: 0.02 K/uL (ref 0.00–0.07)
Basophils Absolute: 0 K/uL (ref 0.0–0.1)
Basophils Relative: 0 %
Eosinophils Absolute: 0.1 K/uL (ref 0.0–0.5)
Eosinophils Relative: 2 %
HCT: 34.5 % — ABNORMAL LOW (ref 39.0–52.0)
Hemoglobin: 11.1 g/dL — ABNORMAL LOW (ref 13.0–17.0)
Immature Granulocytes: 0 %
Lymphocytes Relative: 38 %
Lymphs Abs: 2.6 K/uL (ref 0.7–4.0)
MCH: 30.1 pg (ref 26.0–34.0)
MCHC: 32.2 g/dL (ref 30.0–36.0)
MCV: 93.5 fL (ref 80.0–100.0)
Monocytes Absolute: 0.6 K/uL (ref 0.1–1.0)
Monocytes Relative: 9 %
Neutro Abs: 3.4 K/uL (ref 1.7–7.7)
Neutrophils Relative %: 51 %
Platelet Count: 211 K/uL (ref 150–400)
RBC: 3.69 MIL/uL — ABNORMAL LOW (ref 4.22–5.81)
RDW: 14.2 % (ref 11.5–15.5)
WBC Count: 6.7 K/uL (ref 4.0–10.5)
nRBC: 0 % (ref 0.0–0.2)

## 2023-12-17 LAB — CMP (CANCER CENTER ONLY)
ALT: 9 U/L (ref 0–44)
AST: 19 U/L (ref 15–41)
Albumin: 4.2 g/dL (ref 3.5–5.0)
Alkaline Phosphatase: 55 U/L (ref 38–126)
Anion gap: 11 (ref 5–15)
BUN: 24 mg/dL — ABNORMAL HIGH (ref 8–23)
CO2: 25 mmol/L (ref 22–32)
Calcium: 10.1 mg/dL (ref 8.9–10.3)
Chloride: 102 mmol/L (ref 98–111)
Creatinine: 1.39 mg/dL — ABNORMAL HIGH (ref 0.61–1.24)
GFR, Estimated: 49 mL/min — ABNORMAL LOW (ref >60)
Glucose, Bld: 104 mg/dL — ABNORMAL HIGH (ref 70–99)
Potassium: 3.4 mmol/L — ABNORMAL LOW (ref 3.5–5.1)
Sodium: 139 mmol/L (ref 135–145)
Total Bilirubin: 0.7 mg/dL (ref 0.0–1.2)
Total Protein: 6.3 g/dL — ABNORMAL LOW (ref 6.5–8.1)

## 2023-12-17 LAB — LACTATE DEHYDROGENASE: LDH: 234 U/L — ABNORMAL HIGH (ref 98–192)

## 2023-12-17 MED ORDER — DARATUMUMAB-HYALURONIDASE-FIHJ 1800-30000 MG-UT/15ML ~~LOC~~ SOLN
1800.0000 mg | Freq: Once | SUBCUTANEOUS | Status: AC
  Administered 2023-12-17: 1800 mg via SUBCUTANEOUS
  Filled 2023-12-17: qty 15

## 2023-12-17 MED ORDER — ACETAMINOPHEN 325 MG PO TABS
650.0000 mg | ORAL_TABLET | Freq: Once | ORAL | Status: AC
  Administered 2023-12-17: 650 mg via ORAL
  Filled 2023-12-17: qty 2

## 2023-12-17 MED ORDER — DIPHENHYDRAMINE HCL 25 MG PO CAPS
50.0000 mg | ORAL_CAPSULE | Freq: Once | ORAL | Status: AC
  Administered 2023-12-17: 50 mg via ORAL
  Filled 2023-12-17: qty 2

## 2023-12-17 MED ORDER — DEXAMETHASONE 4 MG PO TABS
20.0000 mg | ORAL_TABLET | Freq: Once | ORAL | Status: AC
  Administered 2023-12-17: 20 mg via ORAL
  Filled 2023-12-17: qty 5

## 2023-12-17 MED ORDER — SODIUM CHLORIDE 0.9% FLUSH
10.0000 mL | Freq: Once | INTRAVENOUS | Status: AC
  Administered 2023-12-17: 10 mL via INTRAVENOUS

## 2023-12-17 MED ORDER — HEPARIN SOD (PORK) LOCK FLUSH 100 UNIT/ML IV SOLN
500.0000 [IU] | Freq: Once | INTRAVENOUS | Status: AC
  Administered 2023-12-17: 500 [IU] via INTRAVENOUS

## 2023-12-17 NOTE — Progress Notes (Signed)
 Hematology and Oncology Follow Up Visit  Robert [name] [id number] Aug 25, 1933 89 y.o. 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 is here today for follow-up and treatment. He is doing well and has no new complaints at this time.  He states that his back is still bothering him at times and that he follows up with ortho in late August.  No falls or syncope reported.  No swelling in his extremities. Pedal pulses are 1+.  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  No M-spike noted last month, IgG level was 569 and kappa light chains were 1.32 mg/dL.  No fever, chills, n/v, cough, rash, dizziness, SOB, chest pain, palpitations, abdominal pain or changes in bowel or bladder habits.  Appetite and hydration are good.  He states that he thinks he may have forgotten to take his antihypertensive medications this AM so he brought some with him incase he has any symptoms of rebound HTN.    ECOG Performance Status: 1 - Symptomatic but completely ambulatory  Medications:  Allergies as of 12/17/2023   No Known Allergies      Medication List        Accurate as of December 17, 2023  9:27 AM. If you have any questions, ask your nurse or doctor.          acetaminophen  650 MG CR tablet Commonly known as: TYLENOL  Take 650 mg by mouth every 8 (eight) hours as needed for pain.   aspirin  81 MG tablet Take 81 mg by mouth daily.   bimatoprost  0.01 % Soln Commonly known as: LUMIGAN  Place 1 drop into both eyes  at bedtime.   CALTRATE 600+D PLUS PO Take 1 tablet by mouth daily.   carvedilol  25 MG tablet Commonly known as: COREG  Take 25 mg by mouth 2 (two) times daily.   cloNIDine  0.1 MG tablet Commonly known as: CATAPRES  Take 0.1 mg by mouth 2 (two) times daily as needed (Elevated BP).   dexamethasone  4 MG tablet Commonly known as: DECADRON  Take 5 tablets prior to immunotherapy treatment What changed:  how much to take how to take this when to take this   diclofenac Sodium 1 % Gel Commonly known as: VOLTAREN Apply 2 g topically 4 (four) times daily. As needed   famciclovir  250 MG tablet Commonly known as: FAMVIR  TAKE 1 TABLET DAILY   losartan  100 MG tablet Commonly known as: COZAAR  Take 100 mg by mouth daily.   meloxicam 15 MG tablet Commonly known as: MOBIC Take 1 tablet by mouth daily as needed for pain.   metolazone  5 MG tablet Commonly known as: ZAROXOLYN  Take 5 mg by mouth daily.   montelukast  10 MG tablet Commonly known as: SINGULAIR  TAKE 1 TABLET AT BEDTIME   potassium chloride  SA 20 MEQ tablet Commonly known as: KLOR-CON  M Take 1 tablet (20 mEq total) by mouth daily.   rosuvastatin 20 MG tablet Commonly known as: CRESTOR  Take 20 mg by mouth at bedtime.   SYSTANE OP Place 1 drop into both eyes daily as needed (dry eyes).   Zoledronic  Acid 4 MG/100ML IVPB Commonly known as: ZOMETA  Inject 4 mg into the vein every 30 (thirty) days. 07/03/2022 Every 4 months.        Allergies: No Known Allergies  Past Medical History, Surgical history, Social history, and Family History were reviewed and updated.  Review of Systems: All other 10 point review of systems is negative.   Physical Exam:  oral temperature is 98.1 F (36.7 C). His blood pressure is 161/89 (abnormal) and his pulse is 61. His respiration is 19 and oxygen saturation is 100%.   Wt Readings from Last 3 Encounters:  11/19/23 206 lb (93.4 kg)  10/19/23 210 lb (95.3 kg)  09/20/23 213 lb 6.4 oz  (96.8 kg)    Ocular: Sclerae unicteric, pupils equal, round and reactive to light Ear-nose-throat: Oropharynx clear, dentition fair Lymphatic: No cervical or supraclavicular adenopathy Lungs no rales or rhonchi, good excursion bilaterally Heart regular rate and rhythm, no murmur appreciated Abd soft, nontender, positive bowel sounds MSK no focal spinal tenderness, no joint edema Neuro: non-focal, well-oriented, appropriate affect Breasts: Deferred   Lab Results  Component Value Date   WBC 6.7 12/17/2023   HGB 11.1 (L) 12/17/2023   HCT 34.5 (L) 12/17/2023   MCV 93.5 12/17/2023   PLT 211 12/17/2023   Lab Results  Component Value Date   FERRITIN 802 (H) 11/19/2023   IRON 71 11/19/2023   TIBC 305 11/19/2023   UIBC 234 11/19/2023   IRONPCTSAT 23 11/19/2023   Lab Results  Component Value Date   RETICCTPCT 2.2 04/05/2019   RBC 3.69 (L) 12/17/2023   Lab Results  Component Value Date   KPAFRELGTCHN 13.2 11/19/2023   LAMBDASER 7.3 11/19/2023   KAPLAMBRATIO 1.81 (H) 11/19/2023   Lab Results  Component Value Date   IGGSERUM 569 (L) 11/19/2023   IGA 32 (L) 11/19/2023   IGMSERUM 74 11/19/2023   Lab Results  Component Value Date   TOTALPROTELP 5.8 (L) 11/19/2023   ALBUMINELP 3.4 11/19/2023   A1GS 0.2 11/19/2023   A2GS 1.0 11/19/2023   BETS 0.7 11/19/2023   BETA2SER 0.3 01/03/2015   GAMS 0.6 11/19/2023   MSPIKE Not Observed 11/19/2023   SPEI Comment 11/19/2023     Chemistry      Component Value Date/Time   NA 139 12/17/2023 0841   NA 137 04/08/2017 1141   NA 138 10/29/2015 1029   K 3.4 (L) 12/17/2023 0841   K 3.4 04/08/2017 1141   K 3.9 10/29/2015 1029   CL 102 12/17/2023 0841   CL 102 04/08/2017 1141   CO2 25 12/17/2023 0841   CO2 30 04/08/2017 1141   CO2 27 10/29/2015 1029   BUN 24 (H) 12/17/2023 0841   BUN 16 04/08/2017 1141   BUN 13.8 10/29/2015 1029   CREATININE 1.39 (H) 12/17/2023 0841   CREATININE 1.3 (H) 04/08/2017 1141   CREATININE 1.1 10/29/2015  1029      Component Value Date/Time   CALCIUM 10.1 12/17/2023 0841   CALCIUM 9.3 04/08/2017 1141   CALCIUM 9.3 10/29/2015 1029   ALKPHOS 55 12/17/2023 0841   ALKPHOS 66 04/08/2017 1141   ALKPHOS 52 10/29/2015 1029   AST 19 12/17/2023 0841   AST 20 10/29/2015 1029   ALT 9 12/17/2023 0841   ALT 21 04/08/2017 1141   ALT 14 10/29/2015 1029   BILITOT 0.7 12/17/2023 0841  BILITOT 0.75 10/29/2015 1029       Impression and Plan: Mr. Gwynne is a very pleasant 89 yo African American gentleman with relapsed IgG kappa myeloma. He has history of stem cell transplant in 2006 and since relapsed.    We will proceed with treatment today as planned.  No ESA needed, Hgb 11.1.  Myeloma and iron studies are pending. We will set him up for IV iron if needed.  Follow-up in 1 month.   Lauraine Pepper, NP 7/25/20259:27 AM

## 2023-12-17 NOTE — Progress Notes (Signed)
 BP 192/89. Pt verbalized he did not take his BP medication this am, pt states he has it with him. Pt proceeded to take his pills and discharged.

## 2023-12-17 NOTE — Patient Instructions (Signed)

## 2023-12-17 NOTE — Progress Notes (Signed)
 Labs reviewed by Provider, ok to treat despite counts

## 2023-12-18 LAB — IGG, IGA, IGM
IgA: 34 mg/dL — ABNORMAL LOW (ref 61–437)
IgG (Immunoglobin G), Serum: 629 mg/dL (ref 603–1613)
IgM (Immunoglobulin M), Srm: 75 mg/dL (ref 15–143)

## 2023-12-20 LAB — KAPPA/LAMBDA LIGHT CHAINS
Kappa free light chain: 17.3 mg/L (ref 3.3–19.4)
Kappa, lambda light chain ratio: 2.11 — ABNORMAL HIGH (ref 0.26–1.65)
Lambda free light chains: 8.2 mg/L (ref 5.7–26.3)

## 2023-12-20 LAB — PROTEIN ELECTROPHORESIS, SERUM
A/G Ratio: 1.4 (ref 0.7–1.7)
Albumin ELP: 3.6 g/dL (ref 2.9–4.4)
Alpha-1-Globulin: 0.2 g/dL (ref 0.0–0.4)
Alpha-2-Globulin: 0.8 g/dL (ref 0.4–1.0)
Beta Globulin: 0.9 g/dL (ref 0.7–1.3)
Gamma Globulin: 0.6 g/dL (ref 0.4–1.8)
Globulin, Total: 2.6 g/dL (ref 2.2–3.9)
Total Protein ELP: 6.2 g/dL (ref 6.0–8.5)

## 2023-12-21 ENCOUNTER — Ambulatory Visit: Payer: Self-pay

## 2023-12-22 ENCOUNTER — Other Ambulatory Visit: Payer: Self-pay

## 2023-12-24 ENCOUNTER — Other Ambulatory Visit: Payer: Self-pay

## 2024-01-10 ENCOUNTER — Other Ambulatory Visit: Payer: Self-pay

## 2024-01-10 ENCOUNTER — Ambulatory Visit (INDEPENDENT_AMBULATORY_CARE_PROVIDER_SITE_OTHER): Admitting: Orthopedic Surgery

## 2024-01-10 DIAGNOSIS — M5416 Radiculopathy, lumbar region: Secondary | ICD-10-CM | POA: Diagnosis not present

## 2024-01-10 MED ORDER — GABAPENTIN 300 MG PO CAPS: 300.0000 mg | ORAL_CAPSULE | Freq: Three times a day (TID) | ORAL | 0 refills | Status: DC

## 2024-01-10 NOTE — Progress Notes (Signed)
 Orthopedic Spine Surgery Office Note   Assessment: Patient is a 89 y.o. 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  Has now developed radicular symptoms along the lateral aspect of the thigh and legs     Plan: -Went over his options in terms of his radicular pain.  I told him he can continue to use Tylenol .  I prescribed gabapentin  for additional pain relief.  I also referred him for a L4/5 ESI -In terms of his foot drop, he should continue use the cane and AFO.  I explained that he has now had this deficit for a year and I do not expect it to spontaneously resolve -He is not interested in any kind of surgery for his radicular type pain so we will talk about Lyrica as a remaining treatment option for him or pain management should the gabapentin  and ESI not provide him with satisfactory relief -Patient should return to office on an as-needed basis     Patient expressed understanding of the plan and all questions were answered to the patient's satisfaction.    ___________________________________________________________________________     History:   Patient is a 89 y.o. male who presents today for follow-up on his lumbar spine.  Patient has noticed progression of his symptoms since he was last seen in the office.  He has developed radiating leg pain.  He feels along the lateral aspect of the thigh and legs.  He notices it to the level of the ankle.  He still has the foot drop as well.  He is using an AFO and a cane which helps him with his balance.  He is able to get around with these 2 assistive devices.  He has not had any bowel or bladder incontinence.  No saddle anesthesia.  No new lower extremity weakness      Treatments tried: AFO, cane use, PT, flexeril      Physical Exam:   General: no acute distress, appears stated age Neurologic: alert, answering questions appropriately, following commands Respiratory: unlabored breathing on room air, symmetric chest  rise Psychiatric: appropriate affect, normal cadence to speech     MSK (spine):   -Strength exam                                                   Left                  Right EHL                              1/5                  1/5 TA                                 1/5                  4/5 GSC                             5/5                  5/5 Knee extension  5/5                  5/5 Hip flexion                    5/5                  5/5   -Sensory exam                           Sensation intact to light touch in L3-S1 nerve distributions of bilateral lower extremities     Imaging: XRs of the lumbar spine from 01/14/2023 were previously independently reviewed and interpreted, showing lumbar 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 Disc height loss seen at all lumbar levels. Anterior osteophyte formation noted at L2/3. No fracture or dislocation seen. No evidence of instability on flexion/extension views.    MRI of the lumbar spine from 11/13/2022 was previously independently reviewed and interpreted, showing small synovial cyst on the left at L1/2. There is central stenosis at that level as well. Central stenosis L3/4. Central stenosis and lateral recess stenosis at L4/5 with a  synovial cyst on the right at L4/5. Foraminal stenosis on the left at L2/3. Disc herniation with central stenosis at T12/L1.      Patient name: Robert [name] Patient MRN: [id number] Date of visit: 01/10/24

## 2024-01-12 ENCOUNTER — Other Ambulatory Visit: Payer: Self-pay | Admitting: Hematology & Oncology

## 2024-01-12 DIAGNOSIS — C9000 Multiple myeloma not having achieved remission: Secondary | ICD-10-CM

## 2024-01-12 DIAGNOSIS — M898X9 Other specified disorders of bone, unspecified site: Secondary | ICD-10-CM

## 2024-01-13 ENCOUNTER — Other Ambulatory Visit: Payer: Self-pay

## 2024-01-18 ENCOUNTER — Inpatient Hospital Stay: Attending: Hematology & Oncology

## 2024-01-18 ENCOUNTER — Inpatient Hospital Stay (HOSPITAL_BASED_OUTPATIENT_CLINIC_OR_DEPARTMENT_OTHER): Admitting: Family

## 2024-01-18 ENCOUNTER — Inpatient Hospital Stay

## 2024-01-18 VITALS — BP 139/90 | HR 70 | Temp 98.1°F | Resp 18 | Wt 207.0 lb

## 2024-01-18 VITALS — BP 170/92 | HR 64 | Resp 18

## 2024-01-18 DIAGNOSIS — K909 Intestinal malabsorption, unspecified: Secondary | ICD-10-CM | POA: Diagnosis not present

## 2024-01-18 DIAGNOSIS — D631 Anemia in chronic kidney disease: Secondary | ICD-10-CM

## 2024-01-18 DIAGNOSIS — Z5112 Encounter for antineoplastic immunotherapy: Secondary | ICD-10-CM | POA: Diagnosis present

## 2024-01-18 DIAGNOSIS — C9000 Multiple myeloma not having achieved remission: Secondary | ICD-10-CM

## 2024-01-18 DIAGNOSIS — M898X9 Other specified disorders of bone, unspecified site: Secondary | ICD-10-CM

## 2024-01-18 DIAGNOSIS — D630 Anemia in neoplastic disease: Secondary | ICD-10-CM | POA: Insufficient documentation

## 2024-01-18 DIAGNOSIS — N183 Chronic kidney disease, stage 3 unspecified: Secondary | ICD-10-CM

## 2024-01-18 DIAGNOSIS — C9002 Multiple myeloma in relapse: Secondary | ICD-10-CM | POA: Diagnosis present

## 2024-01-18 DIAGNOSIS — Z79620 Long term (current) use of immunosuppressive biologic: Secondary | ICD-10-CM | POA: Insufficient documentation

## 2024-01-18 LAB — CMP (CANCER CENTER ONLY)
ALT: 11 U/L (ref 0–44)
AST: 19 U/L (ref 15–41)
Albumin: 4.4 g/dL (ref 3.5–5.0)
Alkaline Phosphatase: 51 U/L (ref 38–126)
Anion gap: 12 (ref 5–15)
BUN: 19 mg/dL (ref 8–23)
CO2: 26 mmol/L (ref 22–32)
Calcium: 10.2 mg/dL (ref 8.9–10.3)
Chloride: 101 mmol/L (ref 98–111)
Creatinine: 1.49 mg/dL — ABNORMAL HIGH (ref 0.61–1.24)
GFR, Estimated: 45 mL/min — ABNORMAL LOW (ref >60)
Glucose, Bld: 92 mg/dL (ref 70–99)
Potassium: 3.6 mmol/L (ref 3.5–5.1)
Sodium: 139 mmol/L (ref 135–145)
Total Bilirubin: 0.7 mg/dL (ref 0.0–1.2)
Total Protein: 6.3 g/dL — ABNORMAL LOW (ref 6.5–8.1)

## 2024-01-18 LAB — IRON AND IRON BINDING CAPACITY (CC-WL,HP ONLY)
Iron: 59 ug/dL (ref 45–182)
Saturation Ratios: 19 % (ref 17.9–39.5)
TIBC: 316 ug/dL (ref 250–450)
UIBC: 257 ug/dL

## 2024-01-18 LAB — CBC WITH DIFFERENTIAL (CANCER CENTER ONLY)
Abs Immature Granulocytes: 0.04 K/uL (ref 0.00–0.07)
Basophils Absolute: 0 K/uL (ref 0.0–0.1)
Basophils Relative: 0 %
Eosinophils Absolute: 0.1 K/uL (ref 0.0–0.5)
Eosinophils Relative: 2 %
HCT: 33.4 % — ABNORMAL LOW (ref 39.0–52.0)
Hemoglobin: 11 g/dL — ABNORMAL LOW (ref 13.0–17.0)
Immature Granulocytes: 1 %
Lymphocytes Relative: 36 %
Lymphs Abs: 2.5 K/uL (ref 0.7–4.0)
MCH: 30.5 pg (ref 26.0–34.0)
MCHC: 32.9 g/dL (ref 30.0–36.0)
MCV: 92.5 fL (ref 80.0–100.0)
Monocytes Absolute: 0.7 K/uL (ref 0.1–1.0)
Monocytes Relative: 9 %
Neutro Abs: 3.7 K/uL (ref 1.7–7.7)
Neutrophils Relative %: 52 %
Platelet Count: 178 K/uL (ref 150–400)
RBC: 3.61 MIL/uL — ABNORMAL LOW (ref 4.22–5.81)
RDW: 14.3 % (ref 11.5–15.5)
WBC Count: 7.1 K/uL (ref 4.0–10.5)
nRBC: 0 % (ref 0.0–0.2)

## 2024-01-18 LAB — RETICULOCYTES
Immature Retic Fract: 9.5 % (ref 2.3–15.9)
RBC.: 3.62 MIL/uL — ABNORMAL LOW (ref 4.22–5.81)
Retic Count, Absolute: 45.3 K/uL (ref 19.0–186.0)
Retic Ct Pct: 1.3 % (ref 0.4–3.1)

## 2024-01-18 LAB — FERRITIN: Ferritin: 648 ng/mL — ABNORMAL HIGH (ref 24–336)

## 2024-01-18 LAB — LACTATE DEHYDROGENASE: LDH: 236 U/L — ABNORMAL HIGH (ref 98–192)

## 2024-01-18 MED ORDER — DIPHENHYDRAMINE HCL 25 MG PO CAPS
50.0000 mg | ORAL_CAPSULE | Freq: Once | ORAL | Status: AC
  Administered 2024-01-18: 50 mg via ORAL
  Filled 2024-01-18: qty 2

## 2024-01-18 MED ORDER — DARATUMUMAB-HYALURONIDASE-FIHJ 1800-30000 MG-UT/15ML ~~LOC~~ SOLN
1800.0000 mg | Freq: Once | SUBCUTANEOUS | Status: AC
  Administered 2024-01-18: 1800 mg via SUBCUTANEOUS
  Filled 2024-01-18: qty 15

## 2024-01-18 MED ORDER — ACETAMINOPHEN 325 MG PO TABS
650.0000 mg | ORAL_TABLET | Freq: Once | ORAL | Status: AC
  Administered 2024-01-18: 650 mg via ORAL
  Filled 2024-01-18: qty 2

## 2024-01-18 MED ORDER — DEXAMETHASONE 4 MG PO TABS
20.0000 mg | ORAL_TABLET | Freq: Once | ORAL | Status: AC
  Administered 2024-01-18: 20 mg via ORAL
  Filled 2024-01-18: qty 5

## 2024-01-18 NOTE — Patient Instructions (Signed)
 [facility] - A DEPT OF [facility]  Discharge Instructions: Thank you for choosing [facility] to provide your oncology and hematology care.   If you have a lab appointment with the Cancer Center, please go directly to the Cancer Center and check in at the registration area.  Wear comfortable clothing and clothing appropriate for easy access to any Portacath or PICC line.   We strive to give you quality time with your provider. You may need to reschedule your appointment if you arrive late (15 or more minutes).  Arriving late affects you and other patients whose appointments are after yours.  Also, if you miss three or more appointments without notifying the office, you may be dismissed from the clinic at the provider's discretion.      For prescription refill requests, have your pharmacy contact our office and allow 72 hours for refills to be completed.    Today you received the following chemotherapy and/or immunotherapy agents:  Faspro      To help prevent nausea and vomiting after your treatment, we encourage you to take your nausea medication as directed.  BELOW ARE SYMPTOMS THAT SHOULD BE REPORTED IMMEDIATELY: *FEVER GREATER THAN 100.4 F (38 C) OR HIGHER *CHILLS OR SWEATING *NAUSEA AND VOMITING THAT IS NOT CONTROLLED WITH YOUR NAUSEA MEDICATION *UNUSUAL SHORTNESS OF BREATH *UNUSUAL BRUISING OR BLEEDING *URINARY PROBLEMS (pain or burning when urinating, or frequent urination) *BOWEL PROBLEMS (unusual diarrhea, constipation, pain near the anus) TENDERNESS IN MOUTH AND THROAT WITH OR WITHOUT PRESENCE OF ULCERS (sore throat, sores in mouth, or a toothache) UNUSUAL RASH, SWELLING OR PAIN  UNUSUAL VAGINAL DISCHARGE OR ITCHING   Items with * indicate a potential emergency and should be followed up as soon as possible or go to the Emergency Department if any problems should occur.  Please show the CHEMOTHERAPY ALERT CARD or IMMUNOTHERAPY  ALERT CARD at check-in to the Emergency Department and triage nurse. Should you have questions after your visit or need to cancel or reschedule your appointment, please contact [facility] - A DEPT OF [facility]  [phone] and follow the prompts.  Office hours are 8:00 a.m. to 4:30 p.m. Monday - Friday. Please note that voicemails left after 4:00 p.m. may not be returned until the following business day.  We are closed weekends and major holidays. You have access to a nurse at all times for urgent questions. Please call the main number to the clinic [phone] and follow the prompts.  For any non-urgent questions, you may also contact your provider using MyChart. We now offer e-Visits for anyone 66 and older to request care online for non-urgent symptoms. For details visit [email or web address].   Also download the MyChart app! Go to the app store, search "MyChart", open the app, select [place], and log in with your MyChart username and password.

## 2024-01-18 NOTE — Progress Notes (Signed)
 Hematology and Oncology Follow Up Visit  Robert [name] [id number] 1934/01/18 89 y.o. 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 He is doing fairly well. He is dealing with lower back and knee pain that waxes and wanes with arthritis.  Minimal to no swelling in the ankles. He is wearing a brace on the left ankle and foot for support of foot drop.  No falls or syncope reported. He is ambulating with a cane for added support.  No M-spike observed at his last visit. IgG level was 629 and kappa light chains 1.73 mg/dL.  No fever, chills, n/v, cough, rash, dizziness, SOB, chest pain, palpitations, abdominal pain or changes in bowel or bladder habits.  Appetite and hydration ae good. Weight is stable at 207 lbs.   ECOG Performance Status: 1 - Symptomatic but completely ambulatory  Medications:  Allergies as of 01/18/2024   No Known Allergies      Medication List        Accurate as of January 18, 2024  9:41 AM. If you have any questions, ask your nurse or doctor.          acetaminophen  650 MG CR tablet Commonly known as: TYLENOL  Take 650 mg by mouth every 8 (eight) hours as needed for pain.   aspirin  81 MG tablet Take 81 mg by mouth daily.   bimatoprost  0.01 % Soln Commonly known as: LUMIGAN  Place 1 drop into both eyes at bedtime.   CALTRATE 600+D PLUS PO Take 1 tablet by mouth daily.   carvedilol  25 MG tablet Commonly known as: COREG  Take 25 mg by  mouth 2 (two) times daily.   cloNIDine  0.1 MG tablet Commonly known as: CATAPRES  Take 0.1 mg by mouth 2 (two) times daily as needed (Elevated BP).   dexamethasone  4 MG tablet Commonly known as: DECADRON  Take 5 tablets prior to immunotherapy treatment What changed:  how much to take how to take this when to take this   diclofenac Sodium 1 % Gel Commonly known as: VOLTAREN Apply 2 g topically 4 (four) times daily. As needed   famciclovir  250 MG tablet Commonly known as: FAMVIR  TAKE 1 TABLET DAILY   gabapentin  300 MG capsule Commonly known as: NEURONTIN  Take 1 capsule (300 mg total) by mouth 3 (three) times daily.   losartan  100 MG tablet Commonly known as: COZAAR  Take 100 mg by mouth daily.   meloxicam 15 MG tablet Commonly known as: MOBIC Take 1 tablet by mouth daily as needed for pain.   metolazone  5 MG tablet Commonly known as: ZAROXOLYN  Take 5 mg by mouth daily.   montelukast  10 MG tablet Commonly known as: SINGULAIR  TAKE 1 TABLET AT BEDTIME   potassium chloride  SA 20 MEQ tablet Commonly known as: KLOR-CON  M Take 1 tablet (20 mEq total) by mouth daily.   rosuvastatin 20 MG tablet Commonly known as: CRESTOR Take 20  mg by mouth at bedtime.   SYSTANE OP Place 1 drop into both eyes daily as needed (dry eyes).   Zoledronic  Acid 4 MG/100ML IVPB Commonly known as: ZOMETA  Inject 4 mg into the vein every 30 (thirty) days. 07/03/2022 Every 4 months.        Allergies: No Known Allergies  Past Medical History, Surgical history, Social history, and Family History were reviewed and updated.  Review of Systems: All other 10 point review of systems is negative.   Physical Exam:  vitals were not taken for this visit.   Wt Readings from Last 3 Encounters:  11/19/23 206 lb (93.4 kg)  10/19/23 210 lb (95.3 kg)  09/20/23 213 lb 6.4 oz (96.8 kg)    Ocular: Sclerae unicteric, pupils equal, round and reactive to light Ear-nose-throat: Oropharynx clear, dentition  fair Lymphatic: No cervical or supraclavicular adenopathy Lungs no rales or rhonchi, good excursion bilaterally Heart regular rate and rhythm, no murmur appreciated Abd soft, nontender, positive bowel sounds MSK no focal spinal tenderness, no joint edema Neuro: non-focal, well-oriented, appropriate affect Breasts: Deferred   Lab Results  Component Value Date   WBC 6.7 12/17/2023   HGB 11.1 (L) 12/17/2023   HCT 34.5 (L) 12/17/2023   MCV 93.5 12/17/2023   PLT 211 12/17/2023   Lab Results  Component Value Date   FERRITIN 802 (H) 11/19/2023   IRON 71 11/19/2023   TIBC 305 11/19/2023   UIBC 234 11/19/2023   IRONPCTSAT 23 11/19/2023   Lab Results  Component Value Date   RETICCTPCT 2.2 04/05/2019   RBC 3.69 (L) 12/17/2023   Lab Results  Component Value Date   KPAFRELGTCHN 17.3 12/17/2023   LAMBDASER 8.2 12/17/2023   KAPLAMBRATIO 2.11 (H) 12/17/2023   Lab Results  Component Value Date   IGGSERUM 629 12/17/2023   IGA 34 (L) 12/17/2023   IGMSERUM 75 12/17/2023   Lab Results  Component Value Date   TOTALPROTELP 6.2 12/17/2023   ALBUMINELP 3.6 12/17/2023   A1GS 0.2 12/17/2023   A2GS 0.8 12/17/2023   BETS 0.9 12/17/2023   BETA2SER 0.3 01/03/2015   GAMS 0.6 12/17/2023   MSPIKE Not Observed 12/17/2023   SPEI Comment 12/17/2023     Chemistry      Component Value Date/Time   NA 139 12/17/2023 0841   NA 137 04/08/2017 1141   NA 138 10/29/2015 1029   K 3.4 (L) 12/17/2023 0841   K 3.4 04/08/2017 1141   K 3.9 10/29/2015 1029   CL 102 12/17/2023 0841   CL 102 04/08/2017 1141   CO2 25 12/17/2023 0841   CO2 30 04/08/2017 1141   CO2 27 10/29/2015 1029   BUN 24 (H) 12/17/2023 0841   BUN 16 04/08/2017 1141   BUN 13.8 10/29/2015 1029   CREATININE 1.39 (H) 12/17/2023 0841   CREATININE 1.3 (H) 04/08/2017 1141   CREATININE 1.1 10/29/2015 1029      Component Value Date/Time   CALCIUM 10.1 12/17/2023 0841   CALCIUM 9.3 04/08/2017 1141   CALCIUM 9.3 10/29/2015 1029    ALKPHOS 55 12/17/2023 0841   ALKPHOS 66 04/08/2017 1141   ALKPHOS 52 10/29/2015 1029   AST 19 12/17/2023 0841   AST 20 10/29/2015 1029   ALT 9 12/17/2023 0841   ALT 21 04/08/2017 1141   ALT 14 10/29/2015 1029   BILITOT 0.7 12/17/2023 0841   BILITOT 0.75 10/29/2015 1029       Impression and Plan: Robert [name] is a very pleasant 89 yo African  American gentleman with relapsed IgG kappa myeloma. He has history of stem cell transplant in 2006 and since relapsed.    We will proceed with treatment today as planned.  No ESA needed, Hgb 11.0.  Myeloma and iron studies are pending. We will set him up for IV iron if needed.  Follow-up in 1 month.   Lauraine Pepper, NP 8/26/20259:41 AM

## 2024-01-18 NOTE — Progress Notes (Signed)
 Pt refused to stay after repeated elevated BP. Pt advised he forgot to take BP medication this morning will take upon returning home.

## 2024-01-18 NOTE — Progress Notes (Signed)
 1240-Pt states that he did not take his morning BP medicine this morning.  BP remains elevated at time of discharge and patient is without complaints at time of discharge.  Pt states that he will take his BP medicine when he gets home today.

## 2024-01-19 LAB — KAPPA/LAMBDA LIGHT CHAINS
Kappa free light chain: 13.4 mg/L (ref 3.3–19.4)
Kappa, lambda light chain ratio: 1.65 (ref 0.26–1.65)
Lambda free light chains: 8.1 mg/L (ref 5.7–26.3)

## 2024-01-19 LAB — IGG, IGA, IGM
IgA: 29 mg/dL — ABNORMAL LOW (ref 61–437)
IgG (Immunoglobin G), Serum: 583 mg/dL — ABNORMAL LOW (ref 603–1613)
IgM (Immunoglobulin M), Srm: 84 mg/dL (ref 15–143)

## 2024-01-20 LAB — PROTEIN ELECTROPHORESIS, SERUM
A/G Ratio: 1.5 (ref 0.7–1.7)
Albumin ELP: 3.4 g/dL (ref 2.9–4.4)
Alpha-1-Globulin: 0.2 g/dL (ref 0.0–0.4)
Alpha-2-Globulin: 0.7 g/dL (ref 0.4–1.0)
Beta Globulin: 0.9 g/dL (ref 0.7–1.3)
Gamma Globulin: 0.6 g/dL (ref 0.4–1.8)
Globulin, Total: 2.3 g/dL (ref 2.2–3.9)
Total Protein ELP: 5.7 g/dL — ABNORMAL LOW (ref 6.0–8.5)

## 2024-01-21 ENCOUNTER — Encounter: Payer: Self-pay | Admitting: Hematology & Oncology

## 2024-01-28 ENCOUNTER — Other Ambulatory Visit: Payer: Self-pay

## 2024-01-28 MED ORDER — METOLAZONE 5 MG PO TABS: 5.0000 mg | ORAL_TABLET | Freq: Every day | ORAL | 3 refills | Status: DC

## 2024-02-03 ENCOUNTER — Other Ambulatory Visit: Payer: Self-pay

## 2024-02-05 ENCOUNTER — Encounter (HOSPITAL_BASED_OUTPATIENT_CLINIC_OR_DEPARTMENT_OTHER): Payer: Self-pay | Admitting: Emergency Medicine

## 2024-02-05 ENCOUNTER — Other Ambulatory Visit: Payer: Self-pay

## 2024-02-05 ENCOUNTER — Emergency Department (HOSPITAL_BASED_OUTPATIENT_CLINIC_OR_DEPARTMENT_OTHER)

## 2024-02-05 ENCOUNTER — Emergency Department (HOSPITAL_BASED_OUTPATIENT_CLINIC_OR_DEPARTMENT_OTHER)
Admission: EM | Admit: 2024-02-05 | Discharge: 2024-02-05 | Disposition: A | Discharge status: Home / Self Care | Attending: Emergency Medicine | Admitting: Emergency Medicine

## 2024-02-05 DIAGNOSIS — I10 Essential (primary) hypertension: Secondary | ICD-10-CM | POA: Insufficient documentation

## 2024-02-05 DIAGNOSIS — S8992XA Unspecified injury of left lower leg, initial encounter: Secondary | ICD-10-CM | POA: Diagnosis present

## 2024-02-05 DIAGNOSIS — Z7982 Long term (current) use of aspirin: Secondary | ICD-10-CM | POA: Insufficient documentation

## 2024-02-05 DIAGNOSIS — S8002XA Contusion of left knee, initial encounter: Secondary | ICD-10-CM | POA: Diagnosis not present

## 2024-02-05 DIAGNOSIS — W19XXXA Unspecified fall, initial encounter: Secondary | ICD-10-CM | POA: Diagnosis not present

## 2024-02-05 DIAGNOSIS — M25562 Pain in left knee: Secondary | ICD-10-CM

## 2024-02-05 DIAGNOSIS — Z79899 Other long term (current) drug therapy: Secondary | ICD-10-CM | POA: Insufficient documentation

## 2024-02-05 MED ORDER — ACETAMINOPHEN-CODEINE 300-30 MG PO TABS
1.0000 | ORAL_TABLET | Freq: Once | ORAL | Status: AC
Start: 2024-02-05 — End: 2024-02-05
  Administered 2024-02-05: 1 via ORAL
  Filled 2024-02-05: qty 1

## 2024-02-05 MED ORDER — ACETAMINOPHEN-CODEINE 300-30 MG PO TABS: 1.0000 | ORAL_TABLET | Freq: Four times a day (QID) | ORAL | 0 refills | Status: AC | PRN

## 2024-02-05 NOTE — ED Triage Notes (Signed)
 Mechanical fall with left knee injury. Occurred 2 weeks ago. Ambulatory with cane at baseline.

## 2024-02-05 NOTE — ED Provider Notes (Signed)
 [facility] Provider Note   CSN: [id number] Arrival date & time: 02/05/24  1318     Patient presents with: Knee Pain (LEFT)   Robert [name] is a 89 y.o. male.   89 year old male with a past medical history hypertension presents to the ED status post mechanical fall which occurred 2 weeks ago.  Patient reports worsening pain to his left knee, exacerbated with any type of ambulation.  Does have scabbing over the left knee which she has been applying iodine to to help with his symptoms.  He does see an orthopedist for prior ongoing back pain, Dr. Ozell Ada.  He tells me he has an ejection scheduled for the 20th of this month.  Not taking any medication for improvement in his symptoms.  Denies any fevers, gait changes, or other complaints.  The history is provided by the patient.  Knee Pain Associated symptoms: no fever        Prior to Admission medications   Medication Sig Start Date End Date Taking? Authorizing Provider  acetaminophen -codeine  (TYLENOL  #3) 300-30 MG tablet Take 1-2 tablets by mouth every 6 (six) hours as needed for up to 3 days for moderate pain (pain score 4-6). 02/05/24 02/08/24 Yes Teagon Kron, PA-C  aspirin  81 MG tablet Take 81 mg by mouth daily.    [provider]  bimatoprost  (LUMIGAN ) 0.01 % SOLN Place 1 drop into both eyes at bedtime.  12/07/11   [provider]  Calcium Carbonate-Vit D-Min (CALTRATE 600+D PLUS PO) Take 1 tablet by mouth daily.    [provider]  carvedilol  (COREG ) 25 MG tablet Take 25 mg by mouth 2 (two) times daily. 09/08/21   [provider]  cloNIDine  (CATAPRES ) 0.1 MG tablet Take 0.1 mg by mouth 2 (two) times daily as needed (Elevated BP).    [provider]  dexamethasone  (DECADRON ) 4 MG tablet Take 5 tablets prior to immunotherapy treatment Patient taking differently: Take 4 mg by mouth See admin instructions. Take 5 tablets prior to immunotherapy  treatment 07/31/22   Ennever, Peter R, MD  diclofenac Sodium (VOLTAREN) 1 % GEL Apply 2 g topically 4 (four) times daily. As needed    [provider]  famciclovir  (FAMVIR ) 250 MG tablet TAKE 1 TABLET DAILY 01/12/22   Timmy Maude SAUNDERS, MD  gabapentin  (NEURONTIN ) 300 MG capsule Take 1 capsule (300 mg total) by mouth 3 (three) times daily. 01/10/24 02/09/24  Ada Ozell LABOR, MD  losartan  (COZAAR ) 100 MG tablet Take 100 mg by mouth daily.    [provider]  meloxicam (MOBIC) 15 MG tablet Take 1 tablet by mouth daily as needed for pain. 10/10/21   [provider]  metolazone  (ZAROXOLYN ) 5 MG tablet Take 1 tablet (5 mg total) by mouth daily. 01/28/24   Timmy Maude SAUNDERS, MD  montelukast  (SINGULAIR ) 10 MG tablet TAKE 1 TABLET AT BEDTIME 01/12/24   Ennever, Peter R, MD  Polyethyl Glycol-Propyl Glycol (SYSTANE OP) Place 1 drop into both eyes daily as needed (dry eyes).    [provider]  potassium chloride  SA (KLOR-CON  M) 20 MEQ tablet Take 1 tablet (20 mEq total) by mouth daily. 10/05/22   Timmy Maude SAUNDERS, MD  rosuvastatin (CRESTOR) 20 MG tablet Take 20 mg by mouth at bedtime. 02/29/20   [provider]  Zoledronic  Acid (ZOMETA ) 4 MG/100ML IVPB Inject 4 mg into the vein every 30 (thirty) days. 07/03/2022 Every 4 months. 09/27/17   [provider]    Allergies: Patient has no known allergies.    Review of Systems  Constitutional:  Negative for fever.  Musculoskeletal:  Positive for arthralgias.    Updated Vital Signs BP (!) 156/90 (BP Location: Right Arm)   Pulse 62   Temp 98.1 F (36.7 C)   Resp 18   Ht 5' 11 (1.803 m)   Wt 94.8 kg   SpO2 99%   BMI 29.15 kg/m   Physical Exam Vitals and nursing note reviewed.  Constitutional:      Appearance: Normal appearance.  HENT:     Head: Normocephalic and atraumatic.     Mouth/Throat:     Mouth: Mucous membranes are moist.  Cardiovascular:     Rate and Rhythm: Normal rate.  Pulmonary:     Effort:  Pulmonary effort is normal.  Abdominal:     General: Abdomen is flat.  Musculoskeletal:     Cervical back: Normal range of motion and neck supple.     Left knee: No swelling. No tenderness.     Comments: No swelling, no palpable effusion, full range of motion with knee extension and flexion.  No laxity noted.  Skin:    General: Skin is warm and dry.     Findings: Bruising (Residual bruising of the left knee.) present.     Comments: Dry scabbing noted around the left knee joint, no erythema but residual bruising also noted.  Neurological:     Mental Status: He is alert and oriented to person, place, and time.     (all labs ordered are listed, but only abnormal results are displayed) Labs Reviewed - No data to display  EKG: None  Radiology: DG Knee Complete 4 Views Left Result Date: 02/05/2024 EXAM: 4 or more VIEW(S) XRAY OF THE LEFT KNEE 02/05/2024 02:20:32 PM COMPARISON: 09/29/2022 CLINICAL HISTORY: Left knee injury. Mechanical fall with left knee injury. Occurred 2 weeks ago. Ambulatory with cane at baseline. FINDINGS: BONES AND JOINTS: No acute fracture. Small exostosis of the medial tibial metaphysis. Early tricompartment osteoarthritis. No joint dislocation. No significant joint effusion. SOFT TISSUES: Vascular calcifications. The soft tissues are otherwise unremarkable. IMPRESSION: 1. No acute fracture or dislocation. 2. Early tricompartment osteoarthritis. Electronically signed by: Katheleen Faes MD 02/05/2024 03:17 PM EDT RP Workstation: [id number]   DG Femur Min 2 Views Left Result Date: 02/05/2024 EXAM: 2 VIEW(S) XRAY OF THE LEFT FEMUR 02/05/2024 02:20:32 PM COMPARISON: 05/07/ 24 CLINICAL HISTORY: Left thigh pain post fall. Mechanical fall with left knee injury. Occurred 2 weeks ago. Ambulatory with cane at baseline. FINDINGS: BONES AND JOINTS: No acute fracture. No focal osseous lesion. No joint dislocation. SOFT TISSUES: The soft tissues are unremarkable. VASCULATURE: Extensive  femoral arterial calcifications. IMPRESSION: 1. No acute fracture or dislocation. 2. Extensive femoral arterial calcifications. Electronically signed by: Dayne Hassell MD 02/05/2024 03:16 PM EDT RP Workstation: [id number]     Procedures   Medications Ordered in the ED  acetaminophen -codeine  (TYLENOL  #3) 300-30 MG per tablet 1 tablet (1 tablet Oral Given 02/05/24 1557)                                    Medical Decision Making Amount and/or Complexity of Data Reviewed Radiology: ordered.  Risk Prescription drug management.   Patient presents to the ED with a chief complaint of left knee pain has been ongoing for the past 2 weeks after a mechanical fall.  Reports he  was not evaluated then, continues to have pain with ambulation along with weightbearing.  Does have residual scabs noted to the left knee.  There is also some residual bruising noted but no erythema, no active changes in the skin, no edema to suggest any infection such as cellulitis at this time.  Denies any fever, no pain with range of motion although difficult for him to do.  I do not suspect septic joint at this time.  X-ray showed   IMPRESSION:  1. No acute fracture or dislocation.  2. Early tricompartment osteoarthritis.    These results were discussed with patient at length, after review of PDMP given Tylenol  3 to help with pain control. Will go home on short course of pain medication.  Return precautions discussed at length.   Portions of this note were generated with Scientist, clinical (histocompatibility and immunogenetics). Dictation errors may occur despite best attempts at proofreading.   Final diagnoses:  Acute pain of left knee    ED Discharge Orders          Ordered    acetaminophen -codeine  (TYLENOL  #3) 300-30 MG tablet  Every 6 hours PRN        02/05/24 1613               Gracie Gupta, PA-C 02/05/24 1615    Ruthe Cornet, DO 02/05/24 1929

## 2024-02-05 NOTE — Discharge Instructions (Addendum)
 You were given pain medication to help with severe pain, please take this medication as prescribed.  If you experience any worsening symptoms, please return to the Emergency department.

## 2024-02-07 ENCOUNTER — Encounter: Admitting: Physical Medicine and Rehabilitation

## 2024-02-11 ENCOUNTER — Other Ambulatory Visit: Payer: Self-pay | Admitting: Radiology

## 2024-02-11 MED ORDER — GABAPENTIN 300 MG PO CAPS: 300.0000 mg | ORAL_CAPSULE | Freq: Three times a day (TID) | ORAL | 0 refills | Status: DC

## 2024-02-12 ENCOUNTER — Emergency Department (HOSPITAL_BASED_OUTPATIENT_CLINIC_OR_DEPARTMENT_OTHER): Admission: EM | Admit: 2024-02-12 | Discharge: 2024-02-12 | Disposition: A | Discharge status: Home / Self Care

## 2024-02-12 ENCOUNTER — Other Ambulatory Visit: Payer: Self-pay

## 2024-02-12 ENCOUNTER — Emergency Department (HOSPITAL_BASED_OUTPATIENT_CLINIC_OR_DEPARTMENT_OTHER)

## 2024-02-12 DIAGNOSIS — Z79899 Other long term (current) drug therapy: Secondary | ICD-10-CM | POA: Diagnosis not present

## 2024-02-12 DIAGNOSIS — Z7982 Long term (current) use of aspirin: Secondary | ICD-10-CM | POA: Insufficient documentation

## 2024-02-12 DIAGNOSIS — W19XXXA Unspecified fall, initial encounter: Secondary | ICD-10-CM | POA: Diagnosis not present

## 2024-02-12 DIAGNOSIS — Z8579 Personal history of other malignant neoplasms of lymphoid, hematopoietic and related tissues: Secondary | ICD-10-CM | POA: Diagnosis not present

## 2024-02-12 DIAGNOSIS — I10 Essential (primary) hypertension: Secondary | ICD-10-CM | POA: Diagnosis not present

## 2024-02-12 DIAGNOSIS — S39012A Strain of muscle, fascia and tendon of lower back, initial encounter: Secondary | ICD-10-CM | POA: Insufficient documentation

## 2024-02-12 DIAGNOSIS — S3992XA Unspecified injury of lower back, initial encounter: Secondary | ICD-10-CM | POA: Diagnosis present

## 2024-02-12 MED ORDER — DICLOFENAC SODIUM 1 % EX GEL: 2.0000 g | Freq: Four times a day (QID) | CUTANEOUS | 0 refills | Status: AC

## 2024-02-12 MED ORDER — ACETAMINOPHEN 500 MG PO TABS
1000.0000 mg | ORAL_TABLET | Freq: Once | ORAL | Status: AC
  Administered 2024-02-12: 1000 mg via ORAL
  Filled 2024-02-12: qty 2

## 2024-02-12 MED ORDER — LIDOCAINE 5 % EX PTCH: 1.0000 | MEDICATED_PATCH | CUTANEOUS | 0 refills | Status: AC

## 2024-02-12 NOTE — Discharge Instructions (Addendum)
 Your x-ray did not show any concerning findings.  Please use the Voltaren  gel as well as the lidocaine  patches.  Continue taking the Tylenol  at home.  Please follow-up with your doctor and return to the emergency department for worsening symptoms.

## 2024-02-12 NOTE — ED Notes (Signed)
 Discharge paperwork reviewed entirely with patient, including follow up care. Pain was under control. The patient received instruction and coaching on their prescriptions, and all follow-up questions were answered.  Pt verbalized understanding as well as all parties involved. No questions or concerns voiced at the time of discharge. No acute distress noted. Pt was encouraged to stay adequately hydrated and eat a healthy diet.   Pt was wheeled out to the PVA in a wheelchair without incident.  Pt advised they will notify their PCP immediately.  The pt was instructed to set up and/or review MyChart for their results; and was informed their Providers all have access to the information as well.

## 2024-02-12 NOTE — ED Notes (Signed)
 Pt complains of lower back pain from sleeping. Has no issues sleeping but has an old mattress that causes lower lumbar pain. Also fell on his L knee 3x weeks ago and injured it. Has brace on for comfort. Uses cane for safety.

## 2024-02-12 NOTE — ED Triage Notes (Signed)
 Reports mechanical fall 3 weeks ago. Continued L sided lower back pain and L knee pain. Seen in ED 2 weeks ago for this fall. Pt in wheelchair in triage.   Denies urinary symptoms, NV, fevers.

## 2024-02-12 NOTE — ED Provider Notes (Signed)
 [place] [facility] AT [facility] Provider Note   CSN: [id number] Arrival date & time: 02/12/24  1303     Patient presents with: Back Pain and Knee Pain   Robert [name] is a 89 y.o. male.   89 year old male with past medical history of multiple myeloma in remission as well as hypertension and hyperlipidemia presenting to the emergency department today with back pain.  The patient fell seen here few days ago.  He had x-ray of his knee and hip performed which were negative.  The patient states that since he has gotten home he has started noticing he is having mostly left-sided back pain.  He does radiate down his leg.  He denies any bowel or bladder dysfunction and has not had any saddle anesthesia.  He denies any new injuries.  He does have some foot drop on the left which is chronic but denies any new weakness.  He came to the emergency department today due to the symptoms.  He has been able to ambulate with a cane.  He states that his knee pain has significantly improved.   Back Pain Knee Pain Associated symptoms: back pain        Prior to Admission medications   Medication Sig Start Date End Date Taking? Authorizing Provider  diclofenac  Sodium (VOLTAREN ) 1 % GEL Apply 2 g topically 4 (four) times daily. 02/12/24  Yes Ula Prentice SAUNDERS, MD  lidocaine  (LIDODERM ) 5 % Place 1 patch onto the skin daily. Remove & Discard patch within 12 hours or as directed by MD 02/12/24  Yes Ula Prentice SAUNDERS, MD  aspirin  81 MG tablet Take 81 mg by mouth daily.    [provider]  bimatoprost  (LUMIGAN ) 0.01 % SOLN Place 1 drop into both eyes at bedtime.  12/07/11   [provider]  Calcium Carbonate-Vit D-Min (CALTRATE 600+D PLUS PO) Take 1 tablet by mouth daily.    [provider]  carvedilol  (COREG ) 25 MG tablet Take 25 mg by mouth 2 (two) times daily. 09/08/21   [provider]  cloNIDine  (CATAPRES ) 0.1 MG tablet Take 0.1 mg by mouth 2 (two) times daily  as needed (Elevated BP).    [provider]  dexamethasone  (DECADRON ) 4 MG tablet Take 5 tablets prior to immunotherapy treatment Patient taking differently: Take 4 mg by mouth See admin instructions. Take 5 tablets prior to immunotherapy treatment 07/31/22   Timmy Maude SAUNDERS, MD  diclofenac  Sodium (VOLTAREN ) 1 % GEL Apply 2 g topically 4 (four) times daily. As needed    [provider]  famciclovir  (FAMVIR ) 250 MG tablet TAKE 1 TABLET DAILY 01/12/22   Timmy Maude SAUNDERS, MD  gabapentin  (NEURONTIN ) 300 MG capsule Take 1 capsule (300 mg total) by mouth 3 (three) times daily. 02/11/24 03/12/24  Georgina Ozell LABOR, MD  losartan  (COZAAR ) 100 MG tablet Take 100 mg by mouth daily.    [provider]  meloxicam (MOBIC) 15 MG tablet Take 1 tablet by mouth daily as needed for pain. 10/10/21   [provider]  metolazone  (ZAROXOLYN ) 5 MG tablet Take 1 tablet (5 mg total) by mouth daily. 01/28/24   Timmy Maude SAUNDERS, MD  montelukast  (SINGULAIR ) 10 MG tablet TAKE 1 TABLET AT BEDTIME 01/12/24   Ennever, Peter R, MD  Polyethyl Glycol-Propyl Glycol (SYSTANE OP) Place 1 drop into both eyes daily as needed (dry eyes).    [provider]  potassium chloride  SA (KLOR-CON  M) 20 MEQ tablet Take 1 tablet (  20 mEq total) by mouth daily. 10/05/22   Timmy Maude SAUNDERS, MD  rosuvastatin (CRESTOR) 20 MG tablet Take 20 mg by mouth at bedtime. 02/29/20   [provider]  Zoledronic  Acid (ZOMETA ) 4 MG/100ML IVPB Inject 4 mg into the vein every 30 (thirty) days. 07/03/2022 Every 4 months. 09/27/17   [provider]    Allergies: Patient has no known allergies.    Review of Systems  Musculoskeletal:  Positive for back pain.  All other systems reviewed and are negative.   Updated Vital Signs BP 128/84 (BP Location: Right Arm)   Pulse 69   Temp 97.7 F (36.5 C)   Resp 17   SpO2 92%   Physical Exam Vitals and nursing note reviewed.   Gen: NAD Eyes: PERRL, EOMI HEENT: no  oropharyngeal swelling Neck: trachea midline Resp: clear to auscultation bilaterally Card: RRR, no murmurs, rubs, or gallops Abd: nontender, nondistended Extremities: no calf tenderness, no edema, normal range of motion of the left knee and left hip noted MSK: The patient has no tenderness over the midline and left paraspinal region in the lumbar region Vascular: 2+ radial pulses bilaterally, 2+ DP pulses bilaterally Neuro: The patient has equal strength and sensation throughout the bilateral lower extremities with exception of the foot drop on the left which is chronic Skin: no rashes Psyc: acting appropriately   (all labs ordered are listed, but only abnormal results are displayed) Labs Reviewed - No data to display  EKG: None  Radiology: DG Lumbar Spine Complete Result Date: 02/12/2024 CLINICAL DATA:  Fall.  Back pain. EXAM: LUMBAR SPINE - COMPLETE 4+ VIEW COMPARISON:  01/14/2023. FINDINGS: There are 5 nonrib-bearing lumbar vertebrae. There is loss of lumbar lordosis. Mild dextroscoliosis noted with apex at L2-3 disc level. No spondylolysis or spondylolisthesis. Vertebral body heights are maintained. No aggressive osseous lesion. Moderate-severe multilevel degenerative changes in the form of reduced intervertebral disc height, endplate sclerosis/irregularity, facet arthropathy and marginal osteophyte formation. Sacroiliac joints are symmetric. Visualized soft tissues are within normal limits. IMPRESSION: No acute osseous abnormality of the lumbar spine. Multilevel degenerative changes. Electronically Signed   By: Ree Molt M.D.   On: 02/12/2024 14:25     Procedures   Medications Ordered in the ED  acetaminophen  (TYLENOL ) tablet 1,000 mg (1,000 mg Oral Given 02/12/24 1342)                                    Medical Decision Making 89 year old male with past medical history of multiple myeloma in remission as well as hypertension and hyperlipidemia presenting to the emergency  department today with low back pain.  The patient does not have any red flag symptoms for cauda equina syndrome or cord compressive lesion at this time.  His neuroexam is reassuring.  It appears that he did have an x-ray of his knee and femur during his most recent visit.  Will check an x-ray of his lumbar spine to eval for compression fractures.  Suspect this is likely due to lumbar strain.  I will reevaluate for ultimate disposition.  Will give the patient Tylenol  for the pain.  The patient's x-rays here do not show any acute findings but do show a lot of degenerative changes.  The patient will be started on topical medications in addition to the Tylenol  he is already taking.  He is discharged with return precautions.  Amount and/or Complexity of Data Reviewed Radiology:  ordered.  Risk OTC drugs. Prescription drug management.        Final diagnoses:  Strain of lumbar region, initial encounter    ED Discharge Orders          Ordered    diclofenac  Sodium (VOLTAREN ) 1 % GEL  4 times daily        02/12/24 1431    lidocaine  (LIDODERM ) 5 %  Every 24 hours        02/12/24 1431               Ula Prentice SAUNDERS, MD 02/12/24 1432

## 2024-02-14 ENCOUNTER — Telehealth: Payer: Self-pay | Admitting: Hematology & Oncology

## 2024-02-14 ENCOUNTER — Ambulatory Visit: Admitting: Orthopedic Surgery

## 2024-02-14 ENCOUNTER — Encounter: Payer: Self-pay | Admitting: Hematology & Oncology

## 2024-02-14 NOTE — Telephone Encounter (Signed)
 pt was offered an appt per last los but declined in scheduling due to a recent fall.

## 2024-02-15 ENCOUNTER — Other Ambulatory Visit: Payer: Self-pay

## 2024-02-19 ENCOUNTER — Emergency Department (HOSPITAL_BASED_OUTPATIENT_CLINIC_OR_DEPARTMENT_OTHER)
Admission: EM | Admit: 2024-02-19 | Discharge: 2024-02-19 | Disposition: A | Discharge status: Home / Self Care | Attending: Emergency Medicine | Admitting: Emergency Medicine

## 2024-02-19 ENCOUNTER — Encounter (HOSPITAL_BASED_OUTPATIENT_CLINIC_OR_DEPARTMENT_OTHER): Payer: Self-pay | Admitting: Emergency Medicine

## 2024-02-19 ENCOUNTER — Other Ambulatory Visit: Payer: Self-pay

## 2024-02-19 DIAGNOSIS — Z7982 Long term (current) use of aspirin: Secondary | ICD-10-CM | POA: Insufficient documentation

## 2024-02-19 DIAGNOSIS — Z79899 Other long term (current) drug therapy: Secondary | ICD-10-CM | POA: Insufficient documentation

## 2024-02-19 DIAGNOSIS — I10 Essential (primary) hypertension: Secondary | ICD-10-CM | POA: Insufficient documentation

## 2024-02-19 DIAGNOSIS — M25562 Pain in left knee: Secondary | ICD-10-CM | POA: Insufficient documentation

## 2024-02-19 DIAGNOSIS — M545 Low back pain, unspecified: Secondary | ICD-10-CM | POA: Insufficient documentation

## 2024-02-19 MED ORDER — IBUPROFEN 400 MG PO TABS: 400.0000 mg | ORAL_TABLET | Freq: Four times a day (QID) | ORAL | 0 refills | Status: AC | PRN

## 2024-02-19 MED ORDER — IBUPROFEN 400 MG PO TABS
400.0000 mg | ORAL_TABLET | Freq: Once | ORAL | Status: AC
  Administered 2024-02-19: 400 mg via ORAL
  Filled 2024-02-19: qty 1

## 2024-02-19 NOTE — ED Triage Notes (Signed)
 Pt reports continued LT lower back and LT knee pain s/p fall; was seen here for same; sts he thinks he needs some medication to manage the pain

## 2024-02-19 NOTE — Discharge Instructions (Addendum)
 You were seen today for low back pain and left knee pain this been present after a mechanical fall.  With your symptoms progressively improving, this is all very reassuring you are not have any signs or symptoms currently that would cause for alarm requiring more workup at this time.  However continue to keep monitoring your symptoms and if you have any new or worsening symptoms, please return to the ED.  Otherwise for persistent symptoms, follow-up with your PCP for long-term management of this pain.  Recommend you continue to take ibuprofen and Tylenol  for pain.  Take Tylenol  (acetominophen)  650mg  every 4-6 hours, as needed for pain or fever. Do not take more than 4,000 mg in a 24-hour period. As this may cause liver damage. While this is rare, if you begin to develop yellowing of the skin or eyes, stop taking and return to ER immediately.  Take Ibuprofen 400mg  every 4-6 hours for pain or fever, not exceeding 3,200 mg per day as more than 3,200mg  can cause Stomach irritation, dizziness, kidney issues with long-term use.  Do not take with meloxicam as this is in the same class of medications.  Additionally continue to use your lidocaine  patches as needed.  Also recommending that you continue to walk regularly with light exercise as this will help improve symptoms.

## 2024-02-19 NOTE — ED Provider Notes (Signed)
 [place] [facility] Provider Note   CSN: [id number] Arrival date & time: 02/19/24  1318     Patient presents with: Back Pain and Knee Pain   Robert [name] is a 89 y.o. male.   Back Pain Knee Pain Associated symptoms: back pain    Patient is 89 year old male to the ED today for concerns for left-sided low back pain and left knee pain that has been progressively improving over the course last 3 weeks since his mechanical fall where he landed on his left knee while tripping in his driveway.  Previous medical history of multiple myeloma in remission, HTN, HLD  Reports that his symptoms have been significantly improving over the course last 3 weeks but came in today because he wanted to have someone look at his back as he was concerned that even though symptoms have been improving that they have been persistent for 3 weeks.  No red flag symptoms at this time: Fever, dysuria, unexplained weight loss, fecal/urinary incontinence, saddle paresthesias  Also denying headache, vision changes, chest pain, shortness of breath, abdominal pain, nausea, vomiting, diarrhea, lower leg swelling.     Prior to Admission medications   Medication Sig Start Date End Date Taking? Authorizing Provider  ibuprofen (ADVIL) 400 MG tablet Take 1 tablet (400 mg total) by mouth every 6 (six) hours as needed. 02/19/24  Yes Beola Terrall RAMAN, PA-C  aspirin  81 MG tablet Take 81 mg by mouth daily.    [provider]  bimatoprost  (LUMIGAN ) 0.01 % SOLN Place 1 drop into both eyes at bedtime.  12/07/11   [provider]  Calcium Carbonate-Vit D-Min (CALTRATE 600+D PLUS PO) Take 1 tablet by mouth daily.    [provider]  carvedilol  (COREG ) 25 MG tablet Take 25 mg by mouth 2 (two) times daily. 09/08/21   [provider]  cloNIDine  (CATAPRES ) 0.1 MG tablet Take 0.1 mg by mouth 2 (two) times daily as needed (Elevated BP).    [provider]   dexamethasone  (DECADRON ) 4 MG tablet Take 5 tablets prior to immunotherapy treatment Patient taking differently: Take 4 mg by mouth See admin instructions. Take 5 tablets prior to immunotherapy treatment 07/31/22   Timmy Maude SAUNDERS, MD  diclofenac  Sodium (VOLTAREN ) 1 % GEL Apply 2 g topically 4 (four) times daily. As needed    [provider]  diclofenac  Sodium (VOLTAREN ) 1 % GEL Apply 2 g topically 4 (four) times daily. 02/12/24   Ula Prentice SAUNDERS, MD  famciclovir  (FAMVIR ) 250 MG tablet TAKE 1 TABLET DAILY 01/12/22   Timmy Maude SAUNDERS, MD  gabapentin  (NEURONTIN ) 300 MG capsule Take 1 capsule (300 mg total) by mouth 3 (three) times daily. 02/11/24 03/12/24  Georgina Ozell LABOR, MD  lidocaine  (LIDODERM ) 5 % Place 1 patch onto the skin daily. Remove & Discard patch within 12 hours or as directed by MD 02/12/24   Ula Prentice SAUNDERS, MD  losartan  (COZAAR ) 100 MG tablet Take 100 mg by mouth daily.    [provider]  meloxicam (MOBIC) 15 MG tablet Take 1 tablet by mouth daily as needed for pain. 10/10/21   [provider]  metolazone  (ZAROXOLYN ) 5 MG tablet Take 1 tablet (5 mg total) by mouth daily. 01/28/24   Timmy Maude SAUNDERS, MD  montelukast  (SINGULAIR ) 10 MG tablet TAKE 1 TABLET AT BEDTIME 01/12/24   Ennever, Peter R, MD  Polyethyl Glycol-Propyl Glycol (SYSTANE OP) Place 1 drop into both eyes daily as needed (dry  eyes).    [provider]  potassium chloride  SA (KLOR-CON  M) 20 MEQ tablet Take 1 tablet (20 mEq total) by mouth daily. 10/05/22   Timmy Maude SAUNDERS, MD  rosuvastatin (CRESTOR) 20 MG tablet Take 20 mg by mouth at bedtime. 02/29/20   [provider]  Zoledronic  Acid (ZOMETA ) 4 MG/100ML IVPB Inject 4 mg into the vein every 30 (thirty) days. 07/03/2022 Every 4 months. 09/27/17   [provider]    Allergies: Patient has no known allergies.    Review of Systems  Musculoskeletal:  Positive for arthralgias and back pain.  All other systems reviewed and are  negative.   Updated Vital Signs BP (!) 151/114 (BP Location: Right Arm)   Pulse 69   Temp 98.2 F (36.8 C) (Oral)   Resp 17   Ht 5' 11 (1.803 m)   Wt 94.8 kg   SpO2 100%   BMI 29.15 kg/m   Physical Exam Vitals and nursing note reviewed.  Constitutional:      General: He is not in acute distress.    Appearance: Normal appearance. He is not ill-appearing or diaphoretic.  HENT:     Head: Normocephalic and atraumatic.  Eyes:     General: No scleral icterus.       Right eye: No discharge.        Left eye: No discharge.     Extraocular Movements: Extraocular movements intact.     Conjunctiva/sclera: Conjunctivae normal.  Cardiovascular:     Rate and Rhythm: Normal rate and regular rhythm.     Pulses: Normal pulses.     Heart sounds: Normal heart sounds. No murmur heard.    No friction rub. No gallop.  Pulmonary:     Effort: Pulmonary effort is normal. No respiratory distress.     Breath sounds: No stridor. No wheezing, rhonchi or rales.  Chest:     Chest wall: No tenderness.  Abdominal:     General: Abdomen is flat. There is no distension.     Palpations: Abdomen is soft.     Tenderness: There is no abdominal tenderness. There is no right CVA tenderness, left CVA tenderness, guarding or rebound.  Musculoskeletal:        General: Tenderness (Mild tenderness noted to left side low back) present. No swelling, deformity or signs of injury.     Cervical back: Normal range of motion. No rigidity.     Right lower leg: No edema.     Left lower leg: No edema.  Skin:    General: Skin is warm and dry.     Findings: No bruising, erythema or lesion.  Neurological:     General: No focal deficit present.     Mental Status: He is alert and oriented to person, place, and time. Mental status is at baseline.     Sensory: No sensory deficit.     Motor: No weakness.     Gait: Gait normal.  Psychiatric:        Mood and Affect: Mood normal.     (all labs ordered are listed, but only  abnormal results are displayed) Labs Reviewed - No data to display  EKG: None  Radiology: No results found.  Procedures   Medications Ordered in the ED - No data to display    Medical Decision Making  This patient is a 89 year old male who presents to the ED for concern of left-sided low back pain and left knee pain after mechanical fall 3 ago.  Noted to have had symptoms been progressively improving but has been maintained some mild persistence and was concerned wishing to have someone evaluated today.  No red flag symptoms.  On physical exam, patient is in no acute distress, afebrile, alert and orient x 4, speaking in full sentences, nontachypneic, nontachycardic.  No erythema or bruising noted.  Mild tenderness noted to left-sided low back.  No CVA tenderness, no abdominal tenderness, LCTAB, RRR, no murmur.  No lower leg edema.  With patient's presentation, suspecting likely improved acute injuries from 3 weeks ago's.  Patient appears to be improving over time and still ambulatory, walking in ED without assistance with cane.  Medications are currently helping symptoms currently taking Tylenol  and lidocaine  patches.  Will prescribe ibuprofen for additional relief as he is requesting another for medication.  Will have him follow-up with PCP for persistent symptoms.  Patient vital signs have remained stable throughout the course of patient's time in the ED. Low suspicion for any other emergent pathology at this time. I believe this patient is safe to be discharged. Provided strict return to ER precautions. Patient expressed agreement and understanding of plan. All questions were answered.  Differential diagnoses prior to evaluation: The emergent differential diagnosis includes, but is not limited to, fracture, ligamentous injury, neurovascular injury, dislocation, malalignment, muscle strain. This is not an exhaustive differential.   Past Medical History / Co-morbidities / Social  History: Multiple myeloma in remission, HTN, HLD, anemia of chronic renal failure,  Additional history: Chart reviewed. Pertinent results include:   Seen on 9/13 for acute knee pain as well as 9/24 lumbar strain.  Reporting same symptoms at both encounters.  No red flag symptoms at that time.  Was able to ambulate with cane.    Medications: I ordered medication including ibuprofen.  I have reviewed the patients home medicines and have made adjustments as needed.  Critical Interventions: None  Social Determinants of Health: None  Disposition: After consideration of the diagnostic results and the patients response to treatment, I feel that the patient would benefit from discharge and treatment as above.   emergency department workup does not suggest an emergent condition requiring admission or immediate intervention beyond what has been performed at this time. The plan is: PCP follow-up, pain management home, return to ED for any worsening symptoms. The patient is safe for discharge and has been instructed to return immediately for worsening symptoms, change in symptoms or any other concerns.   Final diagnoses:  Acute left-sided low back pain without sciatica  Acute pain of left knee    ED Discharge Orders          Ordered    ibuprofen (ADVIL) 400 MG tablet  Every 6 hours PRN        02/19/24 1452               Beola Terrall RAMAN, PA-C 02/19/24 1455    Dreama Longs, MD 02/21/24 1035

## 2024-02-23 ENCOUNTER — Other Ambulatory Visit: Payer: Self-pay

## 2024-02-28 ENCOUNTER — Inpatient Hospital Stay: Admitting: Hematology & Oncology

## 2024-02-28 ENCOUNTER — Inpatient Hospital Stay

## 2024-02-28 ENCOUNTER — Inpatient Hospital Stay: Attending: Hematology & Oncology

## 2024-02-28 VITALS — BP 145/83 | HR 64 | Temp 97.6°F | Resp 18 | Ht 71.0 in | Wt 208.0 lb

## 2024-02-28 VITALS — BP 176/100 | HR 54

## 2024-02-28 DIAGNOSIS — C9000 Multiple myeloma not having achieved remission: Secondary | ICD-10-CM | POA: Diagnosis not present

## 2024-02-28 DIAGNOSIS — C9002 Multiple myeloma in relapse: Secondary | ICD-10-CM | POA: Diagnosis present

## 2024-02-28 DIAGNOSIS — Z79620 Long term (current) use of immunosuppressive biologic: Secondary | ICD-10-CM | POA: Insufficient documentation

## 2024-02-28 DIAGNOSIS — M898X9 Other specified disorders of bone, unspecified site: Secondary | ICD-10-CM

## 2024-02-28 DIAGNOSIS — N183 Chronic kidney disease, stage 3 unspecified: Secondary | ICD-10-CM

## 2024-02-28 DIAGNOSIS — K909 Intestinal malabsorption, unspecified: Secondary | ICD-10-CM

## 2024-02-28 DIAGNOSIS — Z5112 Encounter for antineoplastic immunotherapy: Secondary | ICD-10-CM | POA: Insufficient documentation

## 2024-02-28 DIAGNOSIS — D631 Anemia in chronic kidney disease: Secondary | ICD-10-CM

## 2024-02-28 LAB — CBC WITH DIFFERENTIAL (CANCER CENTER ONLY)
Abs Immature Granulocytes: 0.02 K/uL (ref 0.00–0.07)
Basophils Absolute: 0 K/uL (ref 0.0–0.1)
Basophils Relative: 0 %
Eosinophils Absolute: 0.1 K/uL (ref 0.0–0.5)
Eosinophils Relative: 2 %
HCT: 32 % — ABNORMAL LOW (ref 39.0–52.0)
Hemoglobin: 10.5 g/dL — ABNORMAL LOW (ref 13.0–17.0)
Immature Granulocytes: 0 %
Lymphocytes Relative: 38 %
Lymphs Abs: 2.7 K/uL (ref 0.7–4.0)
MCH: 30.3 pg (ref 26.0–34.0)
MCHC: 32.8 g/dL (ref 30.0–36.0)
MCV: 92.5 fL (ref 80.0–100.0)
Monocytes Absolute: 0.6 K/uL (ref 0.1–1.0)
Monocytes Relative: 8 %
Neutro Abs: 3.7 K/uL (ref 1.7–7.7)
Neutrophils Relative %: 52 %
Platelet Count: 172 K/uL (ref 150–400)
RBC: 3.46 MIL/uL — ABNORMAL LOW (ref 4.22–5.81)
RDW: 14.4 % (ref 11.5–15.5)
WBC Count: 7.2 K/uL (ref 4.0–10.5)
nRBC: 0 % (ref 0.0–0.2)

## 2024-02-28 LAB — CMP (CANCER CENTER ONLY)
ALT: 9 U/L (ref 0–44)
AST: 19 U/L (ref 15–41)
Albumin: 4.1 g/dL (ref 3.5–5.0)
Alkaline Phosphatase: 46 U/L (ref 38–126)
Anion gap: 12 (ref 5–15)
BUN: 26 mg/dL — ABNORMAL HIGH (ref 8–23)
CO2: 26 mmol/L (ref 22–32)
Calcium: 9.6 mg/dL (ref 8.9–10.3)
Chloride: 100 mmol/L (ref 98–111)
Creatinine: 1.52 mg/dL — ABNORMAL HIGH (ref 0.61–1.24)
GFR, Estimated: 44 mL/min — ABNORMAL LOW (ref >60)
Glucose, Bld: 95 mg/dL (ref 70–99)
Potassium: 3.4 mmol/L — ABNORMAL LOW (ref 3.5–5.1)
Sodium: 138 mmol/L (ref 135–145)
Total Bilirubin: 0.7 mg/dL (ref 0.0–1.2)
Total Protein: 6 g/dL — ABNORMAL LOW (ref 6.5–8.1)

## 2024-02-28 LAB — IRON AND IRON BINDING CAPACITY (CC-WL,HP ONLY)
Iron: 63 ug/dL (ref 45–182)
Saturation Ratios: 21 % (ref 17.9–39.5)
TIBC: 300 ug/dL (ref 250–450)
UIBC: 237 ug/dL

## 2024-02-28 LAB — FERRITIN: Ferritin: 599 ng/mL — ABNORMAL HIGH (ref 24–336)

## 2024-02-28 LAB — LACTATE DEHYDROGENASE: LDH: 211 U/L — ABNORMAL HIGH (ref 98–192)

## 2024-02-28 MED ORDER — ZOLEDRONIC ACID 4 MG/5ML IV CONC
3.3000 mg | Freq: Once | INTRAVENOUS | Status: AC
  Administered 2024-02-28: 3.3 mg via INTRAVENOUS
  Filled 2024-02-28: qty 4.13

## 2024-02-28 MED ORDER — ACETAMINOPHEN 325 MG PO TABS
650.0000 mg | ORAL_TABLET | Freq: Once | ORAL | Status: AC
  Administered 2024-02-28: 650 mg via ORAL
  Filled 2024-02-28: qty 2

## 2024-02-28 MED ORDER — DARATUMUMAB-HYALURONIDASE-FIHJ 1800-30000 MG-UT/15ML ~~LOC~~ SOLN
1800.0000 mg | Freq: Once | SUBCUTANEOUS | Status: AC
  Administered 2024-02-28: 1800 mg via SUBCUTANEOUS
  Filled 2024-02-28: qty 15

## 2024-02-28 MED ORDER — DIPHENHYDRAMINE HCL 25 MG PO CAPS
50.0000 mg | ORAL_CAPSULE | Freq: Once | ORAL | Status: AC
  Administered 2024-02-28: 50 mg via ORAL
  Filled 2024-02-28: qty 2

## 2024-02-28 MED ORDER — DEXAMETHASONE 4 MG PO TABS
20.0000 mg | ORAL_TABLET | Freq: Once | ORAL | Status: AC
  Administered 2024-02-28: 20 mg via ORAL
  Filled 2024-02-28: qty 5

## 2024-02-28 NOTE — Patient Instructions (Signed)

## 2024-02-28 NOTE — Progress Notes (Signed)
 Zometa  dose reduced to 3.3 mg for CrCl 44 ml/min per Dr. Timmy.  Norleen JAYSON Sou, [facility] 02/28/24 11:16 AM

## 2024-02-28 NOTE — Patient Instructions (Signed)
 [facility] - A DEPT OF [facility]  Discharge Instructions: Thank you for choosing [facility] to provide your oncology and hematology care.   If you have a lab appointment with the Cancer Center, please go directly to the Cancer Center and check in at the registration area.  Wear comfortable clothing and clothing appropriate for easy access to any Portacath or PICC line.   We strive to give you quality time with your provider. You may need to reschedule your appointment if you arrive late (15 or more minutes).  Arriving late affects you and other patients whose appointments are after yours.  Also, if you miss three or more appointments without notifying the office, you may be dismissed from the clinic at the provider's discretion.      For prescription refill requests, have your pharmacy contact our office and allow 72 hours for refills to be completed.    Today you received the following chemotherapy and/or immunotherapy agents daratumumab       To help prevent nausea and vomiting after your treatment, we encourage you to take your nausea medication as directed.  BELOW ARE SYMPTOMS THAT SHOULD BE REPORTED IMMEDIATELY: *FEVER GREATER THAN 100.4 F (38 C) OR HIGHER *CHILLS OR SWEATING *NAUSEA AND VOMITING THAT IS NOT CONTROLLED WITH YOUR NAUSEA MEDICATION *UNUSUAL SHORTNESS OF BREATH *UNUSUAL BRUISING OR BLEEDING *URINARY PROBLEMS (pain or burning when urinating, or frequent urination) *BOWEL PROBLEMS (unusual diarrhea, constipation, pain near the anus) TENDERNESS IN MOUTH AND THROAT WITH OR WITHOUT PRESENCE OF ULCERS (sore throat, sores in mouth, or a toothache) UNUSUAL RASH, SWELLING OR PAIN  UNUSUAL VAGINAL DISCHARGE OR ITCHING   Items with * indicate a potential emergency and should be followed up as soon as possible or go to the Emergency Department if any problems should occur.  Please show the CHEMOTHERAPY ALERT CARD or  IMMUNOTHERAPY ALERT CARD at check-in to the Emergency Department and triage nurse. Should you have questions after your visit or need to cancel or reschedule your appointment, please contact [facility] - A DEPT OF Eligha Brideg[facility]  [phone] and follow the prompts.  Office hours are 8:00 a.m. to 4:30 p.m. Monday - Friday. Please note that voicemails left after 4:00 p.m. may not be returned until the following business day.  We are closed weekends and major holidays. You have access to a nurse at all times for urgent questions. Please call the main number to the clinic [phone] and follow the prompts.  For any non-urgent questions, you may also contact your provider using MyChart. We now offer e-Visits for anyone 39 and older to request care online for non-urgent symptoms. For details visit [email or web address].   Also download the MyChart app! Go to the app store, search "MyChart", open the app, select [place], and log in with your MyChart username and password.

## 2024-02-28 NOTE — Progress Notes (Signed)
 Hematology and Oncology Follow Up Visit  Robert [name] [id number] 03/27/1934 89 y.o. 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  His main problem has been his left knee.  He apparently fell several weeks ago.  He has a lot of knee pain.  He has been to the emergency room a few times.  They did do x-rays.  X-rays did not show any fracture.  He has some osteoarthritis.  I told him that he has to to see an orthopedist.  I am not sure if he has one.  I noted that he has a spine doctor.  He otherwise is doing okay.  The myeloma has really not been a problem for him.  There has been no monoclonal spike in his blood.  His last IgG level was 583 mg/dL.  The Kappa light chain was 1.3 mg/dL.   Otherwise, he really has had no other problems.  His blood pressure seems to be doing a little bit better.  He has had no change in bowel or bladder habits.  He does have the foot drop with the left foot which has been chronic.  Again, he has a herniated disc at L5-S1.  Dr. Georgina has just been following this.  Overall, I would say that his performance status is probably ECOG 1.    Medications:  Allergies as of 02/28/2024   No Known Allergies      Medication List        Accurate as of February 28, 2024 10:33 AM. If you have any questions, ask your nurse or doctor.          aspirin  81 MG tablet Take 81 mg by mouth daily.   bimatoprost  0.01 % Soln Commonly known as:  LUMIGAN  Place 1 drop into both eyes at bedtime.   CALTRATE 600+D PLUS PO Take 1 tablet by mouth daily.   carvedilol  25 MG tablet Commonly known as: COREG  Take 25 mg by mouth 2 (two) times daily.   cloNIDine  0.1 MG tablet Commonly known as: CATAPRES  Take 0.1 mg by mouth 2 (two) times daily as needed (Elevated BP).   dexamethasone  4 MG tablet Commonly known as: DECADRON  Take 5 tablets prior to immunotherapy treatment What changed:  how much to take how to take this when to take this   diclofenac  Sodium 1 % Gel Commonly known as: VOLTAREN  Apply 2 g topically 4 (four) times daily. As needed   diclofenac  Sodium 1 % Gel Commonly known as: Voltaren  Apply 2 g topically 4 (four) times daily.   famciclovir  250 MG tablet Commonly known as: FAMVIR  TAKE 1 TABLET DAILY   gabapentin  300 MG capsule Commonly known as: NEURONTIN  Take 1 capsule (300 mg total) by mouth 3 (three) times daily.   ibuprofen 400 MG tablet Commonly known as: ADVIL Take 1 tablet (400 mg total) by mouth every 6 (six) hours  as needed.   lidocaine  5 % Commonly known as: Lidoderm  Place 1 patch onto the skin daily. Remove & Discard patch within 12 hours or as directed by MD   losartan  100 MG tablet Commonly known as: COZAAR  Take 100 mg by mouth daily.   meloxicam 15 MG tablet Commonly known as: MOBIC Take 1 tablet by mouth daily as needed for pain.   metolazone  5 MG tablet Commonly known as: ZAROXOLYN  Take 1 tablet (5 mg total) by mouth daily.   montelukast  10 MG tablet Commonly known as: SINGULAIR  TAKE 1 TABLET AT BEDTIME   potassium chloride  SA 20 MEQ tablet Commonly known as: KLOR-CON  M Take 1 tablet (20 mEq total) by mouth daily.   rosuvastatin 20 MG tablet Commonly known as: CRESTOR Take 20 mg by mouth at bedtime.   SYSTANE OP Place 1 drop into both eyes daily as needed (dry eyes).   Zoledronic  Acid 4 MG/100ML IVPB Commonly known as: ZOMETA  Inject 4 mg into the vein every 30 (thirty)  days. 07/03/2022 Every 4 months.        Allergies: No Known Allergies  Past Medical History, Surgical history, Social history, and Family History were reviewed and updated.  Review of Systems: Review of Systems  Constitutional: Negative.   HENT: Negative.    Eyes: Negative.   Respiratory: Negative.    Cardiovascular: Negative.   Gastrointestinal: Negative.   Genitourinary: Negative.   Musculoskeletal:  Positive for joint pain.  Skin: Negative.   Neurological: Negative.   Endo/Heme/Allergies: Negative.   Psychiatric/Behavioral: Negative.       Physical Exam:   Vital signs show temperature of 97.6.  Pulse 64.  Blood pressure 145/83.  Weight is 208 pounds.  Wt Readings from Last 3 Encounters:  02/19/24 209 lb (94.8 kg)  02/05/24 209 lb (94.8 kg)  01/18/24 207 lb (93.9 kg)   Physical Exam Vitals reviewed.  HENT:     Head: Normocephalic and atraumatic.  Eyes:     Pupils: Pupils are equal, round, and reactive to light.  Cardiovascular:     Rate and Rhythm: Normal rate and regular rhythm.     Heart sounds: Normal heart sounds.  Pulmonary:     Effort: Pulmonary effort is normal.     Breath sounds: Normal breath sounds.  Abdominal:     General: Bowel sounds are normal.     Palpations: Abdomen is soft.  Musculoskeletal:        General: No tenderness or deformity. Normal range of motion.     Cervical back: Normal range of motion.  Lymphadenopathy:     Cervical: No cervical adenopathy.  Skin:    General: Skin is warm and dry.     Findings: No erythema or rash.  Neurological:     Mental Status: He is alert and oriented to person, place, and time.     Comments: On his neurological exam, he does have decreased function in the left foot with dorsiflexion.  Right foot is unremarkable.  He has good pulses.  He has no tenderness to palpation over the left lower leg.  Psychiatric:        Behavior: Behavior normal.        Thought Content: Thought content normal.         Judgment: Judgment normal.      Lab Results  Component Value Date   WBC 7.2 02/28/2024   HGB 10.5 (L) 02/28/2024   HCT 32.0 (L) 02/28/2024   MCV 92.5 02/28/2024   PLT  172 02/28/2024   Lab Results  Component Value Date   FERRITIN 648 (H) 01/18/2024   IRON 59 01/18/2024   TIBC 316 01/18/2024   UIBC 257 01/18/2024   IRONPCTSAT 19 01/18/2024   Lab Results  Component Value Date   RETICCTPCT 1.3 01/18/2024   RBC 3.46 (L) 02/28/2024   Lab Results  Component Value Date   KPAFRELGTCHN 13.4 01/18/2024   LAMBDASER 8.1 01/18/2024   KAPLAMBRATIO 1.65 01/18/2024   Lab Results  Component Value Date   IGGSERUM 583 (L) 01/18/2024   IGA 29 (L) 01/18/2024   IGMSERUM 84 01/18/2024   Lab Results  Component Value Date   TOTALPROTELP 5.7 (L) 01/18/2024   ALBUMINELP 3.4 01/18/2024   A1GS 0.2 01/18/2024   A2GS 0.7 01/18/2024   BETS 0.9 01/18/2024   BETA2SER 0.3 01/03/2015   GAMS 0.6 01/18/2024   MSPIKE Not Observed 01/18/2024   SPEI Comment 01/18/2024     Chemistry      Component Value Date/Time   NA 139 01/18/2024 0950   NA 137 04/08/2017 1141   NA 138 10/29/2015 1029   K 3.6 01/18/2024 0950   K 3.4 04/08/2017 1141   K 3.9 10/29/2015 1029   CL 101 01/18/2024 0950   CL 102 04/08/2017 1141   CO2 26 01/18/2024 0950   CO2 30 04/08/2017 1141   CO2 27 10/29/2015 1029   BUN 19 01/18/2024 0950   BUN 16 04/08/2017 1141   BUN 13.8 10/29/2015 1029   CREATININE 1.49 (H) 01/18/2024 0950   CREATININE 1.3 (H) 04/08/2017 1141   CREATININE 1.1 10/29/2015 1029      Component Value Date/Time   CALCIUM 10.2 01/18/2024 0950   CALCIUM 9.3 04/08/2017 1141   CALCIUM 9.3 10/29/2015 1029   ALKPHOS 51 01/18/2024 0950   ALKPHOS 66 04/08/2017 1141   ALKPHOS 52 10/29/2015 1029   AST 19 01/18/2024 0950   AST 20 10/29/2015 1029   ALT 11 01/18/2024 0950   ALT 21 04/08/2017 1141   ALT 14 10/29/2015 1029   BILITOT 0.7 01/18/2024 0950   BILITOT 0.75 10/29/2015 1029       Impression and  Plan: Mr. Payson is a very pleasant 89 yo African American gentleman with relapsed IgG kappa myeloma. He has history of stem cell transplant in 2006 and since relapsed.    I am absolutely amazed  as well he has done with the Faspro.  So far, we have not found any problems with respect to the myeloma coming back..  I do feel bad about the knee.  When I examined the knee, there was no swelling.  There is no erythema.  He had a little bit of healing wound at the base of the knee.  He did have a little bit of decreased range of motion.  Again, I do not see any problems with him going to an orthopedic surgeon.  Again he might need to have an MRI.  However, I will let the orthopedist deal with this.  We will go ahead and give him Zometa  today.  We will plan to get back in 1 month.  Maude JONELLE Crease, MD 10/6/202510:33 AM

## 2024-02-29 ENCOUNTER — Other Ambulatory Visit: Payer: Self-pay

## 2024-02-29 LAB — PROTEIN ELECTROPHORESIS, SERUM
A/G Ratio: 1.4 (ref 0.7–1.7)
Albumin ELP: 3.2 g/dL (ref 2.9–4.4)
Alpha-1-Globulin: 0.2 g/dL (ref 0.0–0.4)
Alpha-2-Globulin: 0.7 g/dL (ref 0.4–1.0)
Beta Globulin: 0.8 g/dL (ref 0.7–1.3)
Gamma Globulin: 0.6 g/dL (ref 0.4–1.8)
Globulin, Total: 2.3 g/dL (ref 2.2–3.9)
Total Protein ELP: 5.5 g/dL — ABNORMAL LOW (ref 6.0–8.5)

## 2024-02-29 LAB — KAPPA/LAMBDA LIGHT CHAINS
Kappa free light chain: 15.8 mg/L (ref 3.3–19.4)
Kappa, lambda light chain ratio: 1.78 — ABNORMAL HIGH (ref 0.26–1.65)
Lambda free light chains: 8.9 mg/L (ref 5.7–26.3)

## 2024-03-01 ENCOUNTER — Ambulatory Visit: Payer: Self-pay

## 2024-03-10 ENCOUNTER — Telehealth: Payer: Self-pay | Admitting: Orthopedic Surgery

## 2024-03-10 MED ORDER — MELOXICAM 15 MG PO TABS: 15.0000 mg | ORAL_TABLET | Freq: Every day | ORAL | 0 refills | Status: AC | PRN

## 2024-03-10 NOTE — Telephone Encounter (Signed)
 Pt is calling in stating that he needs a refill on his Rx: meloxicam (MOBIC) 15 MG.  Pharm: [facility] on [address]

## 2024-03-16 ENCOUNTER — Ambulatory Visit (INDEPENDENT_AMBULATORY_CARE_PROVIDER_SITE_OTHER): Admitting: Orthopedic Surgery

## 2024-03-16 ENCOUNTER — Encounter: Payer: Self-pay | Admitting: Family

## 2024-03-16 ENCOUNTER — Encounter: Payer: Self-pay | Admitting: Hematology & Oncology

## 2024-03-16 DIAGNOSIS — M25562 Pain in left knee: Secondary | ICD-10-CM

## 2024-03-16 MED ORDER — TRAMADOL HCL 50 MG PO TABS: 50.0000 mg | ORAL_TABLET | Freq: Four times a day (QID) | ORAL | 0 refills | Status: AC | PRN

## 2024-03-16 NOTE — Progress Notes (Signed)
 Orthopedic Surgery Progress Note   Assessment: Patient is a 89 y.o. male with left knee pain after a fall.  No physical exam findings of ligamentous injury or meniscal injury.  No radiographic evidence of fracture or dislocation.   Plan: -No operative plans at this time -Weightbearing as tolerated left lower extremity -Recommended quad strengthening and light aerobic activity -Should apply heat to the knee in the morning since that appears to be the worst time -Can use Voltaren  gel over the knee -Can continue to use Tylenol  as needed for pain relief -Prescribed tramadol  for sparing use.  Explained that this is a narcotic medication with risk for addiction -If his pain does not resolve in the next couple of weeks, would recommend an MRI for further evaluation -Patient will return to the office if he has continued knee pain after 4 more weeks  ___________________________________________________________________________  Subjective: Patient had a fall onto his left knee about 6 weeks ago.  He landed directly over the anterior aspect of the knee.  He noted acute onset of pain.  He went to the emergency department and was told that he did not have a fracture.  He was given orthopedic follow-up.  He has noticed the pain has persisted but has gotten slightly better since onset.  Has been using Advil and Tylenol .   Physical Exam:  General: no acute distress, appears stated age Neurologic: alert, answering questions appropriately, following commands Respiratory: unlabored breathing on room air, symmetric chest rise Psychiatric: appropriate affect, normal cadence to speech  MSK:   - Left lower extremity  Extensor mechanism intact, negative Lachman, negative posterior drawer, negative McMurray, no tenderness palpation over the joint line, no effusion, resolving ecchymosis and abrasion over the anterior aspect of the patella, no gross deformity, no pain with logroll Fires hip flexors, quadriceps,  hamstrings, tibialis anterior, gastrocnemius and soleus, extensor hallucis longus Plantarflexes and dorsiflexes toes Sensation intact to light touch in sural, saphenous, tibial, deep peroneal, and superficial peroneal nerve distributions Foot warm and well perfused   Imaging: XRs of the left knee from 02/05/2024 were independently reviewed and interpreted, showing no significant degenerative changes within the knee joint.  No fracture or dislocation seen.   Patient name: Robert [name] Patient MRN: [id number] Date: 03/16/24

## 2024-03-19 ENCOUNTER — Other Ambulatory Visit: Payer: Self-pay

## 2024-03-27 ENCOUNTER — Encounter: Payer: Self-pay | Admitting: Hematology & Oncology

## 2024-03-27 ENCOUNTER — Encounter: Payer: Self-pay | Admitting: Radiology

## 2024-03-27 ENCOUNTER — Inpatient Hospital Stay

## 2024-03-27 ENCOUNTER — Telehealth: Payer: Self-pay | Admitting: Hematology & Oncology

## 2024-03-27 ENCOUNTER — Inpatient Hospital Stay: Admitting: Family

## 2024-03-27 ENCOUNTER — Ambulatory Visit

## 2024-03-27 NOTE — Telephone Encounter (Signed)
 Pt called in requesting to push next treatment out one week. Pt rescheduled to 04/04/24 per pt preference.

## 2024-03-28 ENCOUNTER — Other Ambulatory Visit: Payer: Self-pay

## 2024-04-03 ENCOUNTER — Other Ambulatory Visit: Payer: Self-pay

## 2024-04-04 ENCOUNTER — Inpatient Hospital Stay: Attending: Hematology & Oncology

## 2024-04-04 ENCOUNTER — Inpatient Hospital Stay

## 2024-04-04 ENCOUNTER — Inpatient Hospital Stay: Admitting: Family

## 2024-04-04 ENCOUNTER — Other Ambulatory Visit: Payer: Self-pay | Admitting: Hematology & Oncology

## 2024-04-04 ENCOUNTER — Other Ambulatory Visit: Payer: Self-pay

## 2024-04-04 ENCOUNTER — Encounter: Payer: Self-pay | Admitting: Family

## 2024-04-04 VITALS — BP 174/80 | HR 56 | Temp 97.4°F | Resp 18

## 2024-04-04 DIAGNOSIS — C9000 Multiple myeloma not having achieved remission: Secondary | ICD-10-CM

## 2024-04-04 DIAGNOSIS — Z5112 Encounter for antineoplastic immunotherapy: Secondary | ICD-10-CM | POA: Insufficient documentation

## 2024-04-04 DIAGNOSIS — C9002 Multiple myeloma in relapse: Secondary | ICD-10-CM | POA: Diagnosis present

## 2024-04-04 DIAGNOSIS — D631 Anemia in chronic kidney disease: Secondary | ICD-10-CM | POA: Diagnosis not present

## 2024-04-04 DIAGNOSIS — Z79620 Long term (current) use of immunosuppressive biologic: Secondary | ICD-10-CM | POA: Insufficient documentation

## 2024-04-04 DIAGNOSIS — N183 Chronic kidney disease, stage 3 unspecified: Secondary | ICD-10-CM

## 2024-04-04 DIAGNOSIS — M898X9 Other specified disorders of bone, unspecified site: Secondary | ICD-10-CM

## 2024-04-04 LAB — CBC WITH DIFFERENTIAL (CANCER CENTER ONLY)
Abs Immature Granulocytes: 0.02 K/uL (ref 0.00–0.07)
Basophils Absolute: 0 K/uL (ref 0.0–0.1)
Basophils Relative: 0 %
Eosinophils Absolute: 0.1 K/uL (ref 0.0–0.5)
Eosinophils Relative: 1 %
HCT: 33 % — ABNORMAL LOW (ref 39.0–52.0)
Hemoglobin: 11 g/dL — ABNORMAL LOW (ref 13.0–17.0)
Immature Granulocytes: 0 %
Lymphocytes Relative: 36 %
Lymphs Abs: 2.5 K/uL (ref 0.7–4.0)
MCH: 31 pg (ref 26.0–34.0)
MCHC: 33.3 g/dL (ref 30.0–36.0)
MCV: 93 fL (ref 80.0–100.0)
Monocytes Absolute: 0.5 K/uL (ref 0.1–1.0)
Monocytes Relative: 7 %
Neutro Abs: 3.7 K/uL (ref 1.7–7.7)
Neutrophils Relative %: 56 %
Platelet Count: 162 K/uL (ref 150–400)
RBC: 3.55 MIL/uL — ABNORMAL LOW (ref 4.22–5.81)
RDW: 14.1 % (ref 11.5–15.5)
WBC Count: 6.8 K/uL (ref 4.0–10.5)
nRBC: 0 % (ref 0.0–0.2)

## 2024-04-04 LAB — CMP (CANCER CENTER ONLY)
ALT: 8 U/L (ref 0–44)
AST: 18 U/L (ref 15–41)
Albumin: 4.1 g/dL (ref 3.5–5.0)
Alkaline Phosphatase: 50 U/L (ref 38–126)
Anion gap: 12 (ref 5–15)
BUN: 27 mg/dL — ABNORMAL HIGH (ref 8–23)
CO2: 24 mmol/L (ref 22–32)
Calcium: 9.6 mg/dL (ref 8.9–10.3)
Chloride: 103 mmol/L (ref 98–111)
Creatinine: 1.24 mg/dL (ref 0.61–1.24)
GFR, Estimated: 56 mL/min — ABNORMAL LOW (ref >60)
Glucose, Bld: 113 mg/dL — ABNORMAL HIGH (ref 70–99)
Potassium: 3.7 mmol/L (ref 3.5–5.1)
Sodium: 139 mmol/L (ref 135–145)
Total Bilirubin: 0.9 mg/dL (ref 0.0–1.2)
Total Protein: 6 g/dL — ABNORMAL LOW (ref 6.5–8.1)

## 2024-04-04 LAB — LACTATE DEHYDROGENASE: LDH: 243 U/L — ABNORMAL HIGH (ref 105–235)

## 2024-04-04 MED ORDER — DEXAMETHASONE 4 MG PO TABS
20.0000 mg | ORAL_TABLET | Freq: Once | ORAL | Status: AC
  Administered 2024-04-04: 20 mg via ORAL
  Filled 2024-04-04: qty 5

## 2024-04-04 MED ORDER — ACETAMINOPHEN 325 MG PO TABS
650.0000 mg | ORAL_TABLET | Freq: Once | ORAL | Status: AC
  Administered 2024-04-04: 650 mg via ORAL
  Filled 2024-04-04: qty 2

## 2024-04-04 MED ORDER — DARATUMUMAB-HYALURONIDASE-FIHJ 1800-30000 MG-UT/15ML ~~LOC~~ SOLN
1800.0000 mg | Freq: Once | SUBCUTANEOUS | Status: AC
  Administered 2024-04-04: 1800 mg via SUBCUTANEOUS
  Filled 2024-04-04: qty 15

## 2024-04-04 MED ORDER — DIPHENHYDRAMINE HCL 25 MG PO CAPS
50.0000 mg | ORAL_CAPSULE | Freq: Once | ORAL | Status: AC
  Administered 2024-04-04: 50 mg via ORAL
  Filled 2024-04-04: qty 2

## 2024-04-04 NOTE — Patient Instructions (Signed)

## 2024-04-04 NOTE — Progress Notes (Signed)
 Hematology and Oncology Follow Up Visit  Robert [name] [id number] Oct 27, 1933 89 y.o. 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 He is doing fairly well but struggles with arthritis in the left knee. He is followed closely by orthopedic Dr. Georgina. He plans to contact him for another follow-up. He is in a wheelchair today due to pain and the distance from his car to get into our office.  No new falls, no syncope reported.  No fever, chills, n/v, cough, rash, dizziness, SOB, chest pain, palpitations, abdominal pain or changes in bowel or bladder habits.  No M-spike observed last month and kappa light chains 1.58 mg/dL.  No swelling in his extremities.  No falls or syncope reported.  Appetite and hydration are good. Weight is stable at 205 lbs.   ECOG Performance Status: 1 - Symptomatic but completely ambulatory  Medications:  Allergies as of 04/04/2024   No Known Allergies      Medication List        Accurate as of April 04, 2024 10:37 AM. If you have any questions, ask your nurse or doctor.          aspirin  81 MG tablet Take 81 mg by mouth daily.   bimatoprost  0.01 % Soln Commonly known as: LUMIGAN  Place 1 drop into both eyes at bedtime.   CALTRATE 600+D PLUS PO Take 1 tablet by mouth daily.   carvedilol  25 MG tablet Commonly known as: COREG  Take 25 mg by mouth 2 (two) times daily.   cloNIDine  0.1 MG tablet Commonly known as:  CATAPRES  Take 0.1 mg by mouth 2 (two) times daily as needed (Elevated BP).   dexamethasone  4 MG tablet Commonly known as: DECADRON  Take 5 tablets prior to immunotherapy treatment What changed:  how much to take how to take this when to take this   diclofenac  Sodium 1 % Gel Commonly known as: VOLTAREN  Apply 2 g topically 4 (four) times daily. As needed   diclofenac  Sodium 1 % Gel Commonly known as: Voltaren  Apply 2 g topically 4 (four) times daily.   famciclovir  250 MG tablet Commonly known as: FAMVIR  TAKE 1 TABLET DAILY   gabapentin  300 MG capsule Commonly known as: NEURONTIN  Take 1 capsule (300 mg total) by mouth 3 (three) times daily.   ibuprofen 400 MG tablet Commonly known as: ADVIL Take 1 tablet (400 mg total) by mouth every 6 (six) hours as needed.   lidocaine  5 % Commonly known as: Lidoderm  Place 1 patch onto the skin daily. Remove & Discard patch within 12 hours or as directed by MD   losartan  100 MG tablet Commonly known as: COZAAR  Take 100 mg by mouth daily.   meloxicam 15 MG tablet Commonly known as: MOBIC Take 1 tablet (15 mg total) by mouth daily as needed for pain.   metolazone  5 MG tablet Commonly known as: ZAROXOLYN  Take 1 tablet (  5 mg total) by mouth daily.   montelukast  10 MG tablet Commonly known as: SINGULAIR  TAKE 1 TABLET AT BEDTIME   potassium chloride  SA 20 MEQ tablet Commonly known as: KLOR-CON  M Take 1 tablet (20 mEq total) by mouth daily.   rosuvastatin 20 MG tablet Commonly known as: CRESTOR Take 20 mg by mouth at bedtime.   SYSTANE OP Place 1 drop into both eyes daily as needed (dry eyes).   Zoledronic  Acid 4 MG/100ML IVPB Commonly known as: ZOMETA  Inject 4 mg into the vein every 30 (thirty) days. 07/03/2022 Every 4 months.        Allergies: No Known Allergies  Past Medical History, Surgical history, Social history, and Family History were reviewed and updated.  Review of Systems: All other 10 point review of systems  is negative.   Physical Exam:  vitals were not taken for this visit.   Wt Readings from Last 3 Encounters:  02/28/24 208 lb (94.3 kg)  02/19/24 209 lb (94.8 kg)  02/05/24 209 lb (94.8 kg)    Ocular: Sclerae unicteric, pupils equal, round and reactive to light Ear-nose-throat: Oropharynx clear, dentition fair Lymphatic: No cervical or supraclavicular adenopathy Lungs no rales or rhonchi, good excursion bilaterally Heart regular rate and rhythm, no murmur appreciated Abd soft, nontender, positive bowel sounds MSK no focal spinal tenderness, no joint edema Neuro: non-focal, well-oriented, appropriate affect Breasts: Deferred   Lab Results  Component Value Date   WBC 6.8 04/04/2024   HGB 11.0 (L) 04/04/2024   HCT 33.0 (L) 04/04/2024   MCV 93.0 04/04/2024   PLT 162 04/04/2024   Lab Results  Component Value Date   FERRITIN 599 (H) 02/28/2024   IRON 63 02/28/2024   TIBC 300 02/28/2024   UIBC 237 02/28/2024   IRONPCTSAT 21 02/28/2024   Lab Results  Component Value Date   RETICCTPCT 1.3 01/18/2024   RBC 3.55 (L) 04/04/2024   Lab Results  Component Value Date   KPAFRELGTCHN 15.8 02/28/2024   LAMBDASER 8.9 02/28/2024   KAPLAMBRATIO 1.78 (H) 02/28/2024   Lab Results  Component Value Date   IGGSERUM 583 (L) 01/18/2024   IGA 29 (L) 01/18/2024   IGMSERUM 84 01/18/2024   Lab Results  Component Value Date   TOTALPROTELP 5.5 (L) 02/28/2024   ALBUMINELP 3.2 02/28/2024   A1GS 0.2 02/28/2024   A2GS 0.7 02/28/2024   BETS 0.8 02/28/2024   BETA2SER 0.3 01/03/2015   GAMS 0.6 02/28/2024   MSPIKE Not Observed 02/28/2024   SPEI Comment 02/28/2024     Chemistry      Component Value Date/Time   NA 138 02/28/2024 0933   NA 137 04/08/2017 1141   NA 138 10/29/2015 1029   K 3.4 (L) 02/28/2024 0933   K 3.4 04/08/2017 1141   K 3.9 10/29/2015 1029   CL 100 02/28/2024 0933   CL 102 04/08/2017 1141   CO2 26 02/28/2024 0933   CO2 30 04/08/2017 1141   CO2 27 10/29/2015 1029    BUN 26 (H) 02/28/2024 0933   BUN 16 04/08/2017 1141   BUN 13.8 10/29/2015 1029   CREATININE 1.52 (H) 02/28/2024 0933   CREATININE 1.3 (H) 04/08/2017 1141   CREATININE 1.1 10/29/2015 1029      Component Value Date/Time   CALCIUM 9.6 02/28/2024 0933   CALCIUM 9.3 04/08/2017 1141   CALCIUM 9.3 10/29/2015 1029   ALKPHOS 46 02/28/2024 0933   ALKPHOS 66 04/08/2017 1141   ALKPHOS 52 10/29/2015 1029   AST 19 02/28/2024  0933   AST 20 10/29/2015 1029   ALT 9 02/28/2024 0933   ALT 21 04/08/2017 1141   ALT 14 10/29/2015 1029   BILITOT 0.7 02/28/2024 0933   BILITOT 0.75 10/29/2015 1029       Impression and Plan: Mr. Sofranko is a very pleasant 89 yo African American gentleman with relapsed IgG kappa myeloma. He has history of stem cell transplant in 2006 and since relapsed.   He is doing will on treatment tolerating nicely. Myeloma studies have remained stable.  We will proceed with treatment today as planned.  No ESA needed, Hgb 11.0.  Zometa  is due again in January.  Follow-up in 1 month.    Lauraine Pepper, NP 11/11/202510:37 AM

## 2024-04-05 ENCOUNTER — Telehealth: Payer: Self-pay | Admitting: Orthopedic Surgery

## 2024-04-05 ENCOUNTER — Other Ambulatory Visit: Payer: Self-pay

## 2024-04-05 DIAGNOSIS — G8929 Other chronic pain: Secondary | ICD-10-CM

## 2024-04-05 LAB — IGG, IGA, IGM
IgA: 30 mg/dL — ABNORMAL LOW (ref 61–437)
IgG (Immunoglobin G), Serum: 621 mg/dL (ref 603–1613)
IgM (Immunoglobulin M), Srm: 85 mg/dL (ref 15–143)

## 2024-04-05 LAB — KAPPA/LAMBDA LIGHT CHAINS
Kappa free light chain: 14.5 mg/L (ref 3.3–19.4)
Kappa, lambda light chain ratio: 1.84 — ABNORMAL HIGH (ref 0.26–1.65)
Lambda free light chains: 7.9 mg/L (ref 5.7–26.3)

## 2024-04-05 NOTE — Telephone Encounter (Signed)
 I called and lmovm advising him of Dr. Kathi Der message.

## 2024-04-05 NOTE — Telephone Encounter (Signed)
 Pt called requesting stronger meds for left knee pains. Please send to [facility]. Pt phone number is [phone]

## 2024-04-07 LAB — PROTEIN ELECTROPHORESIS, SERUM, WITH REFLEX
A/G Ratio: 1.6 (ref 0.7–1.7)
Albumin ELP: 3.6 g/dL (ref 2.9–4.4)
Alpha-1-Globulin: 0.2 g/dL (ref 0.0–0.4)
Alpha-2-Globulin: 0.8 g/dL (ref 0.4–1.0)
Beta Globulin: 0.8 g/dL (ref 0.7–1.3)
Gamma Globulin: 0.5 g/dL (ref 0.4–1.8)
Globulin, Total: 2.3 g/dL (ref 2.2–3.9)
Total Protein ELP: 5.9 g/dL — ABNORMAL LOW (ref 6.0–8.5)

## 2024-04-19 ENCOUNTER — Telehealth: Payer: Self-pay | Admitting: Orthopedic Surgery

## 2024-04-19 ENCOUNTER — Ambulatory Visit (INDEPENDENT_AMBULATORY_CARE_PROVIDER_SITE_OTHER): Admitting: Orthopedic Surgery

## 2024-04-19 DIAGNOSIS — M25562 Pain in left knee: Secondary | ICD-10-CM | POA: Diagnosis not present

## 2024-04-19 MED ORDER — TRAMADOL HCL 50 MG PO TABS: 50.0000 mg | ORAL_TABLET | Freq: Four times a day (QID) | ORAL | 0 refills | Status: AC | PRN

## 2024-04-19 NOTE — Telephone Encounter (Signed)
 Pt seen Robert [name] today and he want to see him in 1 wk. Please call pt at [phone].

## 2024-04-19 NOTE — Telephone Encounter (Signed)
 4 wk appt scheduled for 05/22/24 @ 215

## 2024-04-19 NOTE — Progress Notes (Signed)
 Orthopedic Surgery Progress Note     Assessment: Patient is a 89 y.o. male with left knee pain after a fall     Plan: -No operative plans at this time -Patient has tried: Tylenol , Voltaren  gel, cane use, tramadol  -Weightbearing as tolerated left lower extremity -Discussed MRI as a next diagnostic step. He was not interested in that at this time. Wanted to keep with the tramadol  and see if it gets better. Told him tramadol  is a narcotic and not a great long term solution. If he is still hurting next time, he should probably get the MRI to work this up further -Prescribed more tramadol  for him -Patient should return to the office in 4 weeks for repeat evaluation, x-rays at next visit: None   ___________________________________________________________________________   Subjective: Patient continues to have knee pain. States it is similar to prior office visit.  He feels the pain mostly with weightbearing but sometimes notes it at night.  He has been using the tramadol  and has found it helpful.  He has no pain in the thigh or the leg.  He does use a foot drop brace due to chronic foot drop on the left side.     Physical Exam:   General: no acute distress, appears stated age Neurologic: alert, answering questions appropriately, following commands Respiratory: unlabored breathing on room air, symmetric chest rise Psychiatric: appropriate affect, normal cadence to speech   MSK:    - Left lower extremity  Ambulates with cane, antalgic gait             Extensor mechanism intact, negative Lachman, negative posterior drawer, negative McMurray, no tenderness palpation over the joint line, no effusion, minimal ecchymosis over the anterior aspect of the patella, no gross deformity, no pain with logroll Fires hip flexors, quadriceps, hamstrings, tibialis anterior, gastrocnemius and soleus, extensor hallucis longus Plantarflexes and dorsiflexes toes Sensation intact to light touch in sural,  saphenous, tibial, deep peroneal, and superficial peroneal nerve distributions Foot warm and well perfused     Imaging: XRs of the left knee from 02/05/2024 were previously independently reviewed and interpreted, showing no significant degenerative changes within the knee joint.  No fracture or dislocation seen.     Patient name: Robert [name] Patient MRN: [id number] Date: 04/19/24

## 2024-04-24 ENCOUNTER — Ambulatory Visit: Admitting: Hematology & Oncology

## 2024-04-24 ENCOUNTER — Ambulatory Visit

## 2024-04-24 ENCOUNTER — Other Ambulatory Visit

## 2024-04-24 ENCOUNTER — Inpatient Hospital Stay

## 2024-05-01 ENCOUNTER — Inpatient Hospital Stay: Admitting: Hematology & Oncology

## 2024-05-01 ENCOUNTER — Inpatient Hospital Stay

## 2024-05-01 ENCOUNTER — Inpatient Hospital Stay: Attending: Hematology & Oncology

## 2024-05-01 ENCOUNTER — Encounter: Payer: Self-pay | Admitting: Hematology & Oncology

## 2024-05-01 ENCOUNTER — Other Ambulatory Visit: Payer: Self-pay

## 2024-05-01 VITALS — BP 181/105 | HR 105 | Temp 97.6°F | Resp 18 | Ht 71.0 in | Wt 203.0 lb

## 2024-05-01 VITALS — BP 153/77 | HR 88

## 2024-05-01 DIAGNOSIS — Z5112 Encounter for antineoplastic immunotherapy: Secondary | ICD-10-CM | POA: Diagnosis present

## 2024-05-01 DIAGNOSIS — M7989 Other specified soft tissue disorders: Secondary | ICD-10-CM

## 2024-05-01 DIAGNOSIS — D631 Anemia in chronic kidney disease: Secondary | ICD-10-CM

## 2024-05-01 DIAGNOSIS — C9002 Multiple myeloma in relapse: Secondary | ICD-10-CM | POA: Diagnosis present

## 2024-05-01 DIAGNOSIS — M898X9 Other specified disorders of bone, unspecified site: Secondary | ICD-10-CM

## 2024-05-01 DIAGNOSIS — Z79620 Long term (current) use of immunosuppressive biologic: Secondary | ICD-10-CM | POA: Insufficient documentation

## 2024-05-01 DIAGNOSIS — C9000 Multiple myeloma not having achieved remission: Secondary | ICD-10-CM

## 2024-05-01 DIAGNOSIS — N183 Chronic kidney disease, stage 3 unspecified: Secondary | ICD-10-CM

## 2024-05-01 LAB — CBC WITH DIFFERENTIAL (CANCER CENTER ONLY)
Abs Immature Granulocytes: 0.01 K/uL (ref 0.00–0.07)
Basophils Absolute: 0 K/uL (ref 0.0–0.1)
Basophils Relative: 0 %
Eosinophils Absolute: 0.1 K/uL (ref 0.0–0.5)
Eosinophils Relative: 1 %
HCT: 35.9 % — ABNORMAL LOW (ref 39.0–52.0)
Hemoglobin: 11.9 g/dL — ABNORMAL LOW (ref 13.0–17.0)
Immature Granulocytes: 0 %
Lymphocytes Relative: 34 %
Lymphs Abs: 3 K/uL (ref 0.7–4.0)
MCH: 31.2 pg (ref 26.0–34.0)
MCHC: 33.1 g/dL (ref 30.0–36.0)
MCV: 94.2 fL (ref 80.0–100.0)
Monocytes Absolute: 0.7 K/uL (ref 0.1–1.0)
Monocytes Relative: 8 %
Neutro Abs: 4.9 K/uL (ref 1.7–7.7)
Neutrophils Relative %: 57 %
Platelet Count: 206 K/uL (ref 150–400)
RBC: 3.81 MIL/uL — ABNORMAL LOW (ref 4.22–5.81)
RDW: 14.2 % (ref 11.5–15.5)
WBC Count: 8.7 K/uL (ref 4.0–10.5)
nRBC: 0 % (ref 0.0–0.2)

## 2024-05-01 LAB — CMP (CANCER CENTER ONLY)
ALT: 17 U/L (ref 0–44)
AST: 27 U/L (ref 15–41)
Albumin: 4.4 g/dL (ref 3.5–5.0)
Alkaline Phosphatase: 56 U/L (ref 38–126)
Anion gap: 14 (ref 5–15)
BUN: 27 mg/dL — ABNORMAL HIGH (ref 8–23)
CO2: 25 mmol/L (ref 22–32)
Calcium: 10.1 mg/dL (ref 8.9–10.3)
Chloride: 103 mmol/L (ref 98–111)
Creatinine: 1.29 mg/dL — ABNORMAL HIGH (ref 0.61–1.24)
GFR, Estimated: 53 mL/min — ABNORMAL LOW (ref >60)
Glucose, Bld: 136 mg/dL — ABNORMAL HIGH (ref 70–99)
Potassium: 2.9 mmol/L — ABNORMAL LOW (ref 3.5–5.1)
Sodium: 142 mmol/L (ref 135–145)
Total Bilirubin: 1.4 mg/dL — ABNORMAL HIGH (ref 0.0–1.2)
Total Protein: 6.5 g/dL (ref 6.5–8.1)

## 2024-05-01 LAB — LACTATE DEHYDROGENASE: LDH: 269 U/L — ABNORMAL HIGH (ref 105–235)

## 2024-05-01 MED ORDER — DEXAMETHASONE 4 MG PO TABS
20.0000 mg | ORAL_TABLET | Freq: Once | ORAL | Status: AC
  Administered 2024-05-01: 20 mg via ORAL
  Filled 2024-05-01: qty 5

## 2024-05-01 MED ORDER — POTASSIUM CHLORIDE CRYS ER 20 MEQ PO TBCR: 40.0000 meq | EXTENDED_RELEASE_TABLET | Freq: Two times a day (BID) | ORAL | Status: DC

## 2024-05-01 MED ORDER — POTASSIUM CHLORIDE CRYS ER 20 MEQ PO TBCR
40.0000 meq | EXTENDED_RELEASE_TABLET | Freq: Two times a day (BID) | ORAL | Status: AC
  Administered 2024-05-01 (×2): 40 meq via ORAL
  Filled 2024-05-01 (×2): qty 2

## 2024-05-01 MED ORDER — DIPHENHYDRAMINE HCL 25 MG PO CAPS
50.0000 mg | ORAL_CAPSULE | Freq: Once | ORAL | Status: AC
  Administered 2024-05-01: 50 mg via ORAL
  Filled 2024-05-01: qty 2

## 2024-05-01 MED ORDER — ACETAMINOPHEN 325 MG PO TABS
650.0000 mg | ORAL_TABLET | Freq: Once | ORAL | Status: AC
  Administered 2024-05-01: 650 mg via ORAL
  Filled 2024-05-01: qty 2

## 2024-05-01 MED ORDER — DARATUMUMAB-HYALURONIDASE-FIHJ 1800-30000 MG-UT/15ML ~~LOC~~ SOLN
1800.0000 mg | Freq: Once | SUBCUTANEOUS | Status: AC
  Administered 2024-05-01: 1800 mg via SUBCUTANEOUS
  Filled 2024-05-01: qty 15

## 2024-05-01 NOTE — Progress Notes (Signed)
 Returned from MD visit. Repeated BP, remains elevated. MD aware and ok to treat today.  K+ at 2.9, oral K tabs ordered and given in clinic x 2 doses. Education re:BP meds and K reviewed with pt. and wife.  BP improved post tmt. Education reinforced.

## 2024-05-01 NOTE — Progress Notes (Signed)
 Hematology and Oncology Follow Up Visit  Robert [name] [id number] Nov 07, 1933 89 y.o. 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  His wife comes in with him this time.  She has not been with him for quite a while for his appointments.  Clearly, he is not taking his medications.  His blood pressure is quite high.  His pulse is quite high.  She said that when they went to his hometown for a week, he did not take any medications while he was there.  At home, she says that he does not take a lot of his medications..  I told him there is not much I can do about this.  I told him that he has his own choice as well and I want to take medications.  If he does not, he has a significant likelihood of having a stroke.  I know we have had this problem before with him.  His myeloma really is not a problem.  His last monoclonal spike was not detected.  His monoclonal studies showed a IgG level of 621 mg/dL.  The Kappa light chain was 1.4 mg/dL.  He still has issues with the I think foot drop.  He does have back issues.  Again, he does not wish to have any surgery.  Overall, I would have to say that his performance status is probably ECOG 2.   Medications:  Allergies as of 05/01/2024   No Known Allergies      Medication List        Accurate as of May 01, 2024 11:49 AM. If you have any questions, ask your nurse or doctor.          aspirin  81 MG tablet Take 81 mg  by mouth daily.   bimatoprost  0.01 % Soln Commonly known as: LUMIGAN  Place 1 drop into both eyes at bedtime.   CALTRATE 600+D PLUS PO Take 1 tablet by mouth daily.   carvedilol  25 MG tablet Commonly known as: COREG  Take 25 mg by mouth 2 (two) times daily.   cloNIDine  0.1 MG tablet Commonly known as: CATAPRES  Take 0.1 mg by mouth 2 (two) times daily as needed (Elevated BP).   dexamethasone  4 MG tablet Commonly known as: DECADRON  Take 5 tablets prior to immunotherapy treatment   diclofenac  Sodium 1 % Gel Commonly known as: VOLTAREN  Apply 2 g topically 4 (four) times daily. As needed   diclofenac  Sodium 1 % Gel Commonly known as: Voltaren  Apply 2 g topically 4 (four) times daily.   famciclovir  250 MG tablet Commonly known as: FAMVIR  TAKE 1 TABLET DAILY   gabapentin  300 MG capsule Commonly known as: NEURONTIN  Take 1 capsule (300 mg total) by mouth 3 (three) times daily.   ibuprofen  400 MG tablet Commonly known as: ADVIL  Take 1 tablet (400 mg total) by mouth every 6 (six) hours as needed.  lidocaine  5 % Commonly known as: Lidoderm  Place 1 patch onto the skin daily. Remove & Discard patch within 12 hours or as directed by MD   losartan  100 MG tablet Commonly known as: COZAAR  Take 100 mg by mouth daily.   meloxicam 15 MG tablet Commonly known as: MOBIC Take 1 tablet (15 mg total) by mouth daily as needed for pain.   metolazone  5 MG tablet Commonly known as: ZAROXOLYN  Take 1 tablet (5 mg total) by mouth daily.   montelukast  10 MG tablet Commonly known as: SINGULAIR  TAKE 1 TABLET AT BEDTIME   potassium chloride  SA 20 MEQ tablet Commonly known as: KLOR-CON  M Take 1 tablet (20 mEq total) by mouth daily.   rosuvastatin 20 MG tablet Commonly known as: CRESTOR Take 20 mg by mouth at bedtime.   SYSTANE OP Place 1 drop into both eyes daily as needed (dry eyes).   Zoledronic  Acid 4 MG/100ML IVPB Commonly known as: ZOMETA  Inject 4 mg into the vein every 30  (thirty) days. 07/03/2022 Every 4 months.        Allergies: No Known Allergies  Past Medical History, Surgical history, Social history, and Family History were reviewed and updated.  Review of Systems: Review of Systems  Constitutional: Negative.   HENT: Negative.    Eyes: Negative.   Respiratory: Negative.    Cardiovascular: Negative.   Gastrointestinal: Negative.   Genitourinary: Negative.   Musculoskeletal:  Positive for joint pain.  Skin: Negative.   Neurological: Negative.   Endo/Heme/Allergies: Negative.   Psychiatric/Behavioral: Negative.       Physical Exam:   Vital signs show temperature of 97.6.  Pulse 105.  Blood pressure 181/105.  Weight is 203 pounds.     Wt Readings from Last 3 Encounters:  05/01/24 203 lb (92.1 kg)  02/28/24 208 lb (94.3 kg)  02/19/24 209 lb (94.8 kg)   Physical Exam Vitals reviewed.  HENT:     Head: Normocephalic and atraumatic.  Eyes:     Pupils: Pupils are equal, round, and reactive to light.  Cardiovascular:     Rate and Rhythm: Normal rate and regular rhythm.     Heart sounds: Normal heart sounds.  Pulmonary:     Effort: Pulmonary effort is normal.     Breath sounds: Normal breath sounds.  Abdominal:     General: Bowel sounds are normal.     Palpations: Abdomen is soft.  Musculoskeletal:        General: No tenderness or deformity. Normal range of motion.     Cervical back: Normal range of motion.  Lymphadenopathy:     Cervical: No cervical adenopathy.  Skin:    General: Skin is warm and dry.     Findings: No erythema or rash.  Neurological:     Mental Status: He is alert and oriented to person, place, and time.     Comments: On his neurological exam, he does have decreased function in the left foot with dorsiflexion.  Right foot is unremarkable.  He has good pulses.  He has no tenderness to palpation over the left lower leg.  Psychiatric:        Behavior: Behavior normal.        Thought Content: Thought content  normal.        Judgment: Judgment normal.      Lab Results  Component Value Date   WBC 8.7 05/01/2024   HGB 11.9 (L) 05/01/2024   HCT 35.9 (L) 05/01/2024   MCV 94.2 05/01/2024  PLT 206 05/01/2024   Lab Results  Component Value Date   FERRITIN 599 (H) 02/28/2024   IRON 63 02/28/2024   TIBC 300 02/28/2024   UIBC 237 02/28/2024   IRONPCTSAT 21 02/28/2024   Lab Results  Component Value Date   RETICCTPCT 1.3 01/18/2024   RBC 3.81 (L) 05/01/2024   Lab Results  Component Value Date   KPAFRELGTCHN 14.5 04/04/2024   LAMBDASER 7.9 04/04/2024   KAPLAMBRATIO 1.84 (H) 04/04/2024   Lab Results  Component Value Date   IGGSERUM 621 04/04/2024   IGA 30 (L) 04/04/2024   IGMSERUM 85 04/04/2024   Lab Results  Component Value Date   TOTALPROTELP 5.9 (L) 04/04/2024   ALBUMINELP 3.6 04/04/2024   A1GS 0.2 04/04/2024   A2GS 0.8 04/04/2024   BETS 0.8 04/04/2024   BETA2SER 0.3 01/03/2015   GAMS 0.5 04/04/2024   MSPIKE Not Observed 04/04/2024   SPEI Comment 02/28/2024     Chemistry      Component Value Date/Time   NA 142 05/01/2024 0950   NA 137 04/08/2017 1141   NA 138 10/29/2015 1029   K 2.9 (L) 05/01/2024 0950   K 3.4 04/08/2017 1141   K 3.9 10/29/2015 1029   CL 103 05/01/2024 0950   CL 102 04/08/2017 1141   CO2 25 05/01/2024 0950   CO2 30 04/08/2017 1141   CO2 27 10/29/2015 1029   BUN 27 (H) 05/01/2024 0950   BUN 16 04/08/2017 1141   BUN 13.8 10/29/2015 1029   CREATININE 1.29 (H) 05/01/2024 0950   CREATININE 1.3 (H) 04/08/2017 1141   CREATININE 1.1 10/29/2015 1029      Component Value Date/Time   CALCIUM 10.1 05/01/2024 0950   CALCIUM 9.3 04/08/2017 1141   CALCIUM 9.3 10/29/2015 1029   ALKPHOS 56 05/01/2024 0950   ALKPHOS 66 04/08/2017 1141   ALKPHOS 52 10/29/2015 1029   AST 27 05/01/2024 0950   AST 20 10/29/2015 1029   ALT 17 05/01/2024 0950   ALT 21 04/08/2017 1141   ALT 14 10/29/2015 1029   BILITOT 1.4 (H) 05/01/2024 0950   BILITOT 0.75 10/29/2015 1029        Impression and Plan: Robert [name] is a very pleasant 89 yo African American gentleman with relapsed IgG kappa myeloma. He has history of stem cell transplant in 2006 and since relapsed.    Again, I think that his problem will clearly be his noncompliance with medications.  Again I told him that if he does not take his medications, he is at significant risk for stroke.  I really hope that he understands this.  I know his wife is trying to help him.  We will continue him on the Faspro.  He has done okay with Faspro.  Will not get him through the Holiday season.  We will plan to see him back in another month or so.    Maude JONELLE Crease, MD 12/8/202511:49 AM

## 2024-05-01 NOTE — Progress Notes (Signed)
 BP elevated, repeated same. Pt. stated that he forgot to take his meds. Pt. a little unsteady this morning, using cane and wheelchair. He seemed a little confused this morning. Informed me that he forgot to take his orange juice this morning, when I asked him if he usually drinks orange juice, he said no. We had previously talking about his blood pressure medication. Above related to Dr. Jessy nurse prior to seeing MD today.

## 2024-05-01 NOTE — Patient Instructions (Signed)

## 2024-05-01 NOTE — Patient Instructions (Signed)
 [facility]  Discharge Instructions: Thank you for choosing [facility] to provide your oncology and hematology care.   If you have a lab appointment with the Cancer Center, please go directly to the Cancer Center and check in at the registration area.  Wear comfortable clothing and clothing appropriate for easy access to any Portacath or PICC line.   We strive to give you quality time with your provider. You may need to reschedule your appointment if you arrive late (15 or more minutes).  Arriving late affects you and other patients whose appointments are after yours.  Also, if you miss three or more appointments without notifying the office, you may be dismissed from the clinic at the provider's discretion.      For prescription refill requests, have your pharmacy contact our office and allow 72 hours for refills to be completed.    Today you received the following chemotherapy and/or immunotherapy agents Daratumumab       To help prevent nausea and vomiting after your treatment, we encourage you to take your nausea medication as directed.  BELOW ARE SYMPTOMS THAT SHOULD BE REPORTED IMMEDIATELY: *FEVER GREATER THAN 100.4 F (38 C) OR HIGHER *CHILLS OR SWEATING *NAUSEA AND VOMITING THAT IS NOT CONTROLLED WITH YOUR NAUSEA MEDICATION *UNUSUAL SHORTNESS OF BREATH *UNUSUAL BRUISING OR BLEEDING *URINARY PROBLEMS (pain or burning when urinating, or frequent urination) *BOWEL PROBLEMS (unusual diarrhea, constipation, pain near the anus) TENDERNESS IN MOUTH AND THROAT WITH OR WITHOUT PRESENCE OF ULCERS (sore throat, sores in mouth, or a toothache) UNUSUAL RASH, SWELLING OR PAIN  UNUSUAL VAGINAL DISCHARGE OR ITCHING   Items with * indicate a potential emergency and should be followed up as soon as possible or go to the Emergency Department if any problems should occur.  Please show the CHEMOTHERAPY ALERT CARD or  IMMUNOTHERAPY ALERT CARD at check-in to the Emergency Department and triage nurse. Should you have questions after your visit or need to cancel or reschedule your appointment, please contact [facility] - A DEPT OF [facility]  [phone] and follow the prompts.  Office hours are 8:00 a.m. to 4:30 p.m. Monday - Friday. Please note that voicemails left after 4:00 p.m. may not be returned until the following business day.  We are closed weekends and major holidays. You have access to a nurse at all times for urgent questions. Please call the main number to the clinic [phone] and follow the prompts.  For any non-urgent questions, you may also contact your provider using MyChart. We now offer e-Visits for anyone 1 and older to request care online for non-urgent symptoms. For details visit [email or web address].   Also download the MyChart app! Go to the app store, search "MyChart", open the app, select [place], and log in with your MyChart username and password.

## 2024-05-02 LAB — PROTEIN ELECTROPHORESIS, SERUM
A/G Ratio: 1.2 (ref 0.7–1.7)
Albumin ELP: 3.5 g/dL (ref 2.9–4.4)
Alpha-1-Globulin: 0.3 g/dL (ref 0.0–0.4)
Alpha-2-Globulin: 0.9 g/dL (ref 0.4–1.0)
Beta Globulin: 1 g/dL (ref 0.7–1.3)
Gamma Globulin: 0.7 g/dL (ref 0.4–1.8)
Globulin, Total: 2.9 g/dL (ref 2.2–3.9)
Total Protein ELP: 6.4 g/dL (ref 6.0–8.5)

## 2024-05-02 LAB — IGG, IGA, IGM
IgA: 32 mg/dL — ABNORMAL LOW (ref 61–437)
IgG (Immunoglobin G), Serum: 642 mg/dL (ref 603–1613)
IgM (Immunoglobulin M), Srm: 85 mg/dL (ref 15–143)

## 2024-05-02 LAB — KAPPA/LAMBDA LIGHT CHAINS
Kappa free light chain: 13 mg/L (ref 3.3–19.4)
Kappa, lambda light chain ratio: 2.1 — ABNORMAL HIGH (ref 0.26–1.65)
Lambda free light chains: 6.2 mg/L (ref 5.7–26.3)

## 2024-05-03 ENCOUNTER — Other Ambulatory Visit: Payer: Self-pay

## 2024-05-13 ENCOUNTER — Other Ambulatory Visit: Payer: Self-pay

## 2024-05-22 ENCOUNTER — Encounter: Admitting: Orthopedic Surgery

## 2024-05-23 ENCOUNTER — Other Ambulatory Visit: Payer: Self-pay | Admitting: Orthopedic Surgery

## 2024-05-23 ENCOUNTER — Other Ambulatory Visit: Payer: Self-pay | Admitting: Hematology & Oncology

## 2024-05-28 ENCOUNTER — Other Ambulatory Visit: Payer: Self-pay

## 2024-05-28 ENCOUNTER — Emergency Department (HOSPITAL_BASED_OUTPATIENT_CLINIC_OR_DEPARTMENT_OTHER)

## 2024-05-28 ENCOUNTER — Encounter (HOSPITAL_BASED_OUTPATIENT_CLINIC_OR_DEPARTMENT_OTHER): Payer: Self-pay

## 2024-05-28 ENCOUNTER — Emergency Department (HOSPITAL_BASED_OUTPATIENT_CLINIC_OR_DEPARTMENT_OTHER)
Admission: EM | Admit: 2024-05-28 | Discharge: 2024-05-28 | Disposition: A | Discharge status: Home / Self Care | Attending: Emergency Medicine | Admitting: Emergency Medicine

## 2024-05-28 DIAGNOSIS — Z7982 Long term (current) use of aspirin: Secondary | ICD-10-CM | POA: Diagnosis not present

## 2024-05-28 DIAGNOSIS — Z8579 Personal history of other malignant neoplasms of lymphoid, hematopoietic and related tissues: Secondary | ICD-10-CM | POA: Insufficient documentation

## 2024-05-28 DIAGNOSIS — Z9481 Bone marrow transplant status: Secondary | ICD-10-CM | POA: Insufficient documentation

## 2024-05-28 DIAGNOSIS — J069 Acute upper respiratory infection, unspecified: Secondary | ICD-10-CM | POA: Insufficient documentation

## 2024-05-28 DIAGNOSIS — R051 Acute cough: Secondary | ICD-10-CM

## 2024-05-28 DIAGNOSIS — Z87891 Personal history of nicotine dependence: Secondary | ICD-10-CM | POA: Insufficient documentation

## 2024-05-28 DIAGNOSIS — R059 Cough, unspecified: Secondary | ICD-10-CM | POA: Diagnosis present

## 2024-05-28 DIAGNOSIS — E785 Hyperlipidemia, unspecified: Secondary | ICD-10-CM | POA: Diagnosis not present

## 2024-05-28 DIAGNOSIS — I10 Essential (primary) hypertension: Secondary | ICD-10-CM | POA: Insufficient documentation

## 2024-05-28 DIAGNOSIS — Z79899 Other long term (current) drug therapy: Secondary | ICD-10-CM | POA: Insufficient documentation

## 2024-05-28 DIAGNOSIS — D509 Iron deficiency anemia, unspecified: Secondary | ICD-10-CM | POA: Insufficient documentation

## 2024-05-28 DIAGNOSIS — R062 Wheezing: Secondary | ICD-10-CM | POA: Diagnosis present

## 2024-05-28 LAB — CBC WITH DIFFERENTIAL/PLATELET
Abs Immature Granulocytes: 0.02 K/uL (ref 0.00–0.07)
Basophils Absolute: 0 K/uL (ref 0.0–0.1)
Basophils Relative: 0 %
Eosinophils Absolute: 0.2 K/uL (ref 0.0–0.5)
Eosinophils Relative: 2 %
HCT: 35.7 % — ABNORMAL LOW (ref 39.0–52.0)
Hemoglobin: 11.6 g/dL — ABNORMAL LOW (ref 13.0–17.0)
Immature Granulocytes: 0 %
Lymphocytes Relative: 36 %
Lymphs Abs: 2.5 K/uL (ref 0.7–4.0)
MCH: 31 pg (ref 26.0–34.0)
MCHC: 32.5 g/dL (ref 30.0–36.0)
MCV: 95.5 fL (ref 80.0–100.0)
Monocytes Absolute: 0.6 K/uL (ref 0.1–1.0)
Monocytes Relative: 8 %
Neutro Abs: 3.8 K/uL (ref 1.7–7.7)
Neutrophils Relative %: 54 %
Platelets: 214 K/uL (ref 150–400)
RBC: 3.74 MIL/uL — ABNORMAL LOW (ref 4.22–5.81)
RDW: 13.5 % (ref 11.5–15.5)
WBC: 7 K/uL (ref 4.0–10.5)
nRBC: 0 % (ref 0.0–0.2)

## 2024-05-28 LAB — BASIC METABOLIC PANEL WITH GFR
Anion gap: 13 (ref 5–15)
BUN: 29 mg/dL — ABNORMAL HIGH (ref 8–23)
CO2: 26 mmol/L (ref 22–32)
Calcium: 10.1 mg/dL (ref 8.9–10.3)
Chloride: 102 mmol/L (ref 98–111)
Creatinine, Ser: 1.23 mg/dL (ref 0.61–1.24)
GFR, Estimated: 56 mL/min — ABNORMAL LOW
Glucose, Bld: 97 mg/dL (ref 70–99)
Potassium: 3.6 mmol/L (ref 3.5–5.1)
Sodium: 142 mmol/L (ref 135–145)

## 2024-05-28 LAB — PRO BRAIN NATRIURETIC PEPTIDE: Pro Brain Natriuretic Peptide: 222 pg/mL

## 2024-05-28 LAB — RESP PANEL BY RT-PCR (RSV, FLU A&B, COVID)  RVPGX2
Influenza A by PCR: NEGATIVE
Influenza B by PCR: NEGATIVE
Resp Syncytial Virus by PCR: NEGATIVE
SARS Coronavirus 2 by RT PCR: NEGATIVE

## 2024-05-28 LAB — TROPONIN T, HIGH SENSITIVITY
Troponin T High Sensitivity: 56 ng/L — ABNORMAL HIGH (ref 0–19)
Troponin T High Sensitivity: 50 ng/L — ABNORMAL HIGH (ref 0–19)

## 2024-05-28 MED ORDER — CARVEDILOL 25 MG PO TABS: 25.0000 mg | ORAL_TABLET | Freq: Two times a day (BID) | ORAL | 0 refills | Status: AC

## 2024-05-28 MED ORDER — CLONIDINE HCL 0.1 MG PO TABS
0.1000 mg | ORAL_TABLET | Freq: Once | ORAL | Status: AC
  Administered 2024-05-28: 0.1 mg via ORAL
  Filled 2024-05-28: qty 1

## 2024-05-28 MED ORDER — LOSARTAN POTASSIUM 25 MG PO TABS
100.0000 mg | ORAL_TABLET | Freq: Once | ORAL | Status: AC
  Administered 2024-05-28: 100 mg via ORAL
  Filled 2024-05-28: qty 4

## 2024-05-28 MED ORDER — OXYMETAZOLINE HCL 0.05 % NA SOLN: 1.0000 | Freq: Two times a day (BID) | NASAL | 0 refills | Status: AC

## 2024-05-28 MED ORDER — ALBUTEROL SULFATE HFA 108 (90 BASE) MCG/ACT IN AERS: 1.0000 | INHALATION_SPRAY | Freq: Four times a day (QID) | RESPIRATORY_TRACT | 0 refills | Status: AC | PRN

## 2024-05-28 MED ORDER — CLONIDINE HCL 0.1 MG PO TABS: 0.1000 mg | ORAL_TABLET | Freq: Two times a day (BID) | ORAL | 0 refills | Status: AC | PRN

## 2024-05-28 MED ORDER — LOSARTAN POTASSIUM 100 MG PO TABS: 100.0000 mg | ORAL_TABLET | Freq: Every day | ORAL | 0 refills | Status: AC

## 2024-05-28 MED ORDER — SODIUM CHLORIDE 0.9 % IV BOLUS
500.0000 mL | Freq: Once | INTRAVENOUS | Status: AC
  Administered 2024-05-28: 500 mL via INTRAVENOUS

## 2024-05-28 MED ORDER — IOHEXOL 350 MG/ML SOLN
75.0000 mL | Freq: Once | INTRAVENOUS | Status: AC | PRN
  Administered 2024-05-28: 75 mL via INTRAVENOUS

## 2024-05-28 MED ORDER — IPRATROPIUM-ALBUTEROL 0.5-2.5 (3) MG/3ML IN SOLN
3.0000 mL | Freq: Once | RESPIRATORY_TRACT | Status: AC
  Administered 2024-05-28: 3 mL via RESPIRATORY_TRACT
  Filled 2024-05-28: qty 3

## 2024-05-28 MED ORDER — HYDRALAZINE HCL 20 MG/ML IJ SOLN
10.0000 mg | Freq: Once | INTRAMUSCULAR | Status: AC
  Administered 2024-05-28: 10 mg via INTRAVENOUS
  Filled 2024-05-28: qty 1

## 2024-05-28 MED ORDER — AZITHROMYCIN 250 MG PO TABS: 250.0000 mg | ORAL_TABLET | Freq: Every day | ORAL | 0 refills | Status: AC

## 2024-05-28 NOTE — ED Notes (Signed)
 Ambulated with cane,on room air HR 77, SpO2. Ambulated approximately  100 feet, HR 88, SpO2 95-100%.

## 2024-05-28 NOTE — ED Triage Notes (Signed)
 Cough, congestion, fatigue, hot flashes for 3 days. States he thinks he is wheezing  Denies chest pain or SHOB.   No obvious signs of respiratory distress in triage   States has not taken BP meds today, denies headache, dizziness, visual changes

## 2024-05-28 NOTE — ED Provider Notes (Signed)
 " [facility] Provider Note  CSN: [id number] Arrival date & time: 05/28/24 1036  Chief Complaint(s) Cough  HPI Robert [name] is a 89 y.o. male with past medical history as below, significant for hypertension, hyperlipidemia, IgG multiple myeloma, IDA, bone marrow transplant who presents to the ED with complaint of cough, difficulty breathing  Symptoms ongoing over the past 2 days, seems to worsen at nighttime.  Reports he is waking up frequently throughout the evening with difficulty breathing and wheezing sensation.  He has no chest pain.  He is having intermittent coughing that is nonproductive.  Subjective fevers last couple days, no chills.  No vomiting or nausea.  No change in bowel or bladder function.  No leg swelling.  Denies similar symptoms in the past.  Did not take his blood pressure medicine this morning.  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   Glaucoma of both eyes 10/10/2019   Goals of care, counseling/discussion 05/12/2018   Groin lump 12/28/2016   Hyperlipidemia    Hypertension    IgG multiple myeloma (HCC) 07/23/2004   Iron deficiency anemia 10/07/2018   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   Lytic bone lesions on xray 03/17/2016   Multiple myeloma (HCC)    2006   Multiple myeloma in remission (HCC) 03/19/2010   Myeloma (HCC) 07/02/2011   S/P autologous bone marrow transplantation (HCC) 04/29/2005   Swelling of both lower extremities 10/18/2018   Syncope 12/24/2017   White coat syndrome with diagnosis of hypertension 11/28/2015   Patient Active Problem List   Diagnosis Date Noted   Foot drop,  left 12/23/2022   Synovial cyst of lumbar spine 12/23/2022   Multiple myeloma (HCC)    Hypertension    Hyperlipidemia    Arthritis    Glaucoma of both eyes 10/10/2019   Swelling of both lower extremities 10/18/2018   Iron deficiency anemia 10/07/2018   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   Anemia of chronic renal failure, stage 3 (moderate) (HCC) 10/06/2018   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   Goals of care, counseling/discussion 05/12/2018   Syncope 12/24/2017   Groin lump 12/28/2016   Lytic bone lesions on xray 03/17/2016   Cerumen debris on tympanic membrane 11/28/2015   White coat syndrome with diagnosis of hypertension 11/28/2015   Dyslipidemia 04/08/2015   Myeloma (HCC) 07/02/2011   ED (erectile dysfunction) 06/02/2010   Benign essential hypertension 03/19/2010   Multiple myeloma in remission (HCC) 03/19/2010   B12 deficiency 02/17/2010   S/P autologous bone marrow transplantation (HCC) 04/29/2005   IgG multiple myeloma (HCC) 07/23/2004   Home Medication(s) Prior to Admission medications  Medication Sig Start Date End Date Taking? Authorizing Provider  albuterol  (VENTOLIN  HFA) 108 (90 Base) MCG/ACT inhaler Inhale 1-2 puffs into the lungs every 6 (six) hours as needed for wheezing or shortness of breath. 05/28/24  Yes Elnor Savant A, DO  azithromycin  (ZITHROMAX ) 250 MG tablet Take 1 tablet (250 mg total) by mouth daily. Take first 2 tablets together, then 1 every day until finished. 05/28/24  Yes Elnor Savant LABOR, DO  oxymetazoline  (AFRIN NASAL SPRAY) 0.05 % nasal spray Place 1 spray into both nostrils 2 (  two) times daily for 3 days. 05/28/24 05/31/24 Yes Elnor Jayson LABOR, DO  aspirin  81 MG tablet Take 81 mg by mouth daily.    [provider]  bimatoprost (LUMIGAN) 0.01 % SOLN Place 1 drop into both eyes at bedtime.  12/07/11   [provider]  Calcium Carbonate-Vit D-Min (CALTRATE 600+D PLUS PO) Take 1 tablet by mouth daily.    [provider]  carvedilol   (COREG ) 25 MG tablet Take 1 tablet (25 mg total) by mouth 2 (two) times daily. 05/28/24 06/27/24  Elnor Jayson A, DO  cloNIDine  (CATAPRES ) 0.1 MG tablet Take 1 tablet (0.1 mg total) by mouth 2 (two) times daily as needed (Elevated BP). 05/28/24   Elnor Jayson LABOR, DO  dexamethasone  (DECADRON ) 4 MG tablet Take 5 tablets prior to immunotherapy treatment 07/31/22   Ennever, Peter R, MD  diclofenac  Sodium (VOLTAREN ) 1 % GEL Apply 2 g topically 4 (four) times daily. As needed    [provider]  diclofenac  Sodium (VOLTAREN ) 1 % GEL Apply 2 g topically 4 (four) times daily. 02/12/24   Ula Prentice SAUNDERS, MD  famciclovir  (FAMVIR ) 250 MG tablet TAKE 1 TABLET DAILY 01/12/22   Timmy Maude SAUNDERS, MD  gabapentin  (NEURONTIN ) 300 MG capsule TAKE 1 CAPSULE THREE TIMES A DAY 05/23/24   Georgina Ozell LABOR, MD  ibuprofen  (ADVIL ) 400 MG tablet Take 1 tablet (400 mg total) by mouth every 6 (six) hours as needed. 02/19/24   Bauer, Collin S, PA-C  lidocaine  (LIDODERM ) 5 % Place 1 patch onto the skin daily. Remove & Discard patch within 12 hours or as directed by MD 02/12/24   Ula Prentice SAUNDERS, MD  losartan  (COZAAR ) 100 MG tablet Take 1 tablet (100 mg total) by mouth daily. 05/28/24   Elnor Jayson LABOR, DO  meloxicam  (MOBIC ) 15 MG tablet Take 1 tablet (15 mg total) by mouth daily as needed for pain. 03/10/24   Georgina Ozell LABOR, MD  metolazone  (ZAROXOLYN ) 5 MG tablet TAKE 1 TABLET 1 HOUR PRIOR TO TAKING LASIX  DAILY 05/23/24   Timmy Maude SAUNDERS, MD  montelukast  (SINGULAIR ) 10 MG tablet TAKE 1 TABLET AT BEDTIME 01/12/24   Ennever, Peter R, MD  Polyethyl Glycol-Propyl Glycol (SYSTANE OP) Place 1 drop into both eyes daily as needed (dry eyes).    [provider]  potassium chloride  SA (KLOR-CON  M) 20 MEQ tablet Take 1 tablet (20 mEq total) by mouth daily. 10/05/22   Timmy Maude SAUNDERS, MD  rosuvastatin (CRESTOR) 20 MG tablet Take 20 mg by mouth at bedtime. 02/29/20   [provider]  Zoledronic  Acid (ZOMETA ) 4 MG/100ML IVPB Inject 4 mg  into the vein every 30 (thirty) days. 07/03/2022 Every 4 months. 09/27/17   [provider]  Past Surgical History Past Surgical History:  Procedure Laterality Date   CATARACT EXTRACTION BILATERAL W/ ANTERIOR VITRECTOMY     IR IMAGING GUIDED PORT INSERTION  05/30/2018   LIMBAL STEM CELL TRANSPLANT     Family History Family History  Problem Relation Age of Onset   Heart attack Father    Heart disease Father    Throat cancer Mother    Diabetes Brother    Colon cancer Neg Hx     Social History Social History[1] Allergies Patient has no known allergies.  Review of Systems A thorough review of systems was obtained and all systems are negative except as noted in the HPI and PMH.   Physical Exam Vital Signs  I have reviewed the triage vital signs BP (!) 191/102   Pulse 70   Temp 98 F (36.7 C) (Oral)   Resp 16   SpO2 100%  Physical Exam Vitals and nursing note reviewed.  Constitutional:      General: He is not in acute distress.    Appearance: He is well-developed.  HENT:     Head: Normocephalic and atraumatic.     Right Ear: External ear normal.     Left Ear: External ear normal.     Mouth/Throat:     Mouth: Mucous membranes are moist.  Eyes:     General: No scleral icterus. Cardiovascular:     Rate and Rhythm: Normal rate and regular rhythm.     Pulses: Normal pulses.     Heart sounds: Normal heart sounds.  Pulmonary:     Effort: Pulmonary effort is normal. No respiratory distress.     Breath sounds: Wheezing present.     Comments: Trace wheeze bilateral, coarse breath sounds Abdominal:     General: Abdomen is flat.     Palpations: Abdomen is soft.     Tenderness: There is no abdominal tenderness.  Musculoskeletal:     Cervical back: No rigidity.     Right lower leg: No edema.     Left lower leg: No edema.  Skin:    General:  Skin is warm and dry.     Capillary Refill: Capillary refill takes less than 2 seconds.  Neurological:     Mental Status: He is alert.  Psychiatric:        Mood and Affect: Mood normal.        Behavior: Behavior normal.     ED Results and Treatments Labs (all labs ordered are listed, but only abnormal results are displayed) Labs Reviewed  BASIC METABOLIC PANEL WITH GFR - Abnormal; Notable for the following components:      Result Value   BUN 29 (*)    GFR, Estimated 56 (*)    All other components within normal limits  CBC WITH DIFFERENTIAL/PLATELET - Abnormal; Notable for the following components:   RBC 3.74 (*)    Hemoglobin 11.6 (*)    HCT 35.7 (*)    All other components within normal limits  TROPONIN T, HIGH SENSITIVITY - Abnormal; Notable for the following components:   Troponin T High Sensitivity 56 (*)    All other components within normal limits  TROPONIN T, HIGH SENSITIVITY - Abnormal; Notable for the following components:   Troponin T High Sensitivity 50 (*)    All other components within normal limits  RESP PANEL BY RT-PCR (RSV, FLU A&B, COVID)  RVPGX2  PRO BRAIN NATRIURETIC PEPTIDE  Radiology CT Angio Chest PE W and/or Wo Contrast Result Date: 05/28/2024 CLINICAL DATA:  Cough, congestion, wheezing and fatigue. EXAM: CT ANGIOGRAPHY CHEST WITH CONTRAST TECHNIQUE: Multidetector CT imaging of the chest was performed using the standard protocol during bolus administration of intravenous contrast. Multiplanar CT image reconstructions and MIPs were obtained to evaluate the vascular anatomy. RADIATION DOSE REDUCTION: This exam was performed according to the departmental dose-optimization program which includes automated exposure control, adjustment of the mA and/or kV according to patient size and/or use of iterative reconstruction technique. CONTRAST:  75mL  OMNIPAQUE  IOHEXOL  350 MG/ML SOLN COMPARISON:  None Available. FINDINGS: Cardiovascular: The pulmonary arteries are adequately opacified. There is no evidence of pulmonary embolism. Although the main pulmonary artery is not dilated at 2.5 cm, the right pulmonary artery demonstrates dilatation measuring 3.5 cm in the left pulmonary artery measures up to 3 cm. The thoracic aorta is normal in caliber. There is atherosclerosis at the level of the aortic arch and descending thoracic aorta. The heart is mildly enlarged. Calcified aortic valve and mitral annulus. No pericardial fluid identified. Calcified coronary artery plaque present in a 3 vessel distribution. Right chest port. Mediastinum/Nodes: Evidence of prior granulomatous disease with multiple calcified bilateral hilar and subcarinal lymph nodes. No enlarged lymph nodes identified. Small hiatal hernia. Lungs/Pleura: Calcified granuloma in the anterior right middle lobe. Scarring at both lung bases. There is no evidence of pulmonary edema, consolidation, pneumothorax or pleural fluid. Upper Abdomen: Multiple calcified granuloma throughout the visualized liver and spleen. Musculoskeletal: No chest wall abnormality. No acute or significant osseous findings. Review of the MIP images confirms the above findings. IMPRESSION: 1. No evidence of pulmonary embolism or other acute findings in the chest. 2. Dilatation of the right and left pulmonary arteries. The main pulmonary artery is not dilated. Findings are suggestive of some degree of pulmonary hypertension. 3. Evidence of prior granulomatous disease with calcified granuloma in the right middle lobe, calcified bilateral hilar and subcarinal lymph nodes and multiple calcified granuloma throughout the visualized liver and spleen. 4. Coronary atherosclerosis with calcified coronary artery plaque in a 3 vessel distribution. 5. Small hiatal hernia. 6. Aortic atherosclerosis. Aortic Atherosclerosis (ICD10-I70.0).  Electronically Signed   By: Marcey Moan M.D.   On: 05/28/2024 13:18   DG Chest Port 1 View Result Date: 05/28/2024 CLINICAL DATA:  Cough, wheezing EXAM: PORTABLE CHEST 1 VIEW COMPARISON:  May 01, 2022 FINDINGS: Stable cardiomediastinal silhouette. Right internal jugular Port-A-Cath is unchanged. Hypoinflation of the lungs is noted with minimal bibasilar subsegmental atelectasis. IMPRESSION: Hypoinflation of the lungs with minimal bibasilar subsegmental atelectasis. Electronically Signed   By: Lynwood Landy Raddle M.D.   On: 05/28/2024 11:13    Pertinent labs & imaging results that were available during my care of the patient were reviewed by me and considered in my medical decision making (see MDM for details).  Medications Ordered in ED Medications  losartan  (COZAAR ) tablet 100 mg (100 mg Oral Given 05/28/24 1125)  cloNIDine  (CATAPRES ) tablet 0.1 mg (0.1 mg Oral Given 05/28/24 1130)  sodium chloride  0.9 % bolus 500 mL (0 mLs Intravenous Stopped 05/28/24 1514)  ipratropium-albuterol  (DUONEB) 0.5-2.5 (3) MG/3ML nebulizer solution 3 mL (3 mLs Nebulization Given 05/28/24 1105)  iohexol  (OMNIPAQUE ) 350 MG/ML injection 75 mL (75 mLs Intravenous Contrast Given 05/28/24 1254)  hydrALAZINE  (APRESOLINE ) injection 10 mg (10 mg Intravenous Given 05/28/24 1527)  cloNIDine  (CATAPRES ) tablet 0.1 mg (0.1 mg Oral Given 05/28/24 1527)  Procedures Procedures  (including critical care time)  Medical Decision Making / ED Course    Medical Decision Making:    ZAYDIN BILLEY is a 89 y.o. male with past medical history as below, significant for hypertension, hyperlipidemia, IgG multiple myeloma, IDA, bone marrow transplant who presents to the ED with complaint of cough, difficulty breathing. The complaint involves an extensive differential diagnosis and also carries with it a high risk of  complications and morbidity.  Serious etiology was considered. Ddx includes but is not limited to: In my evaluation of this patient's dyspnea my DDx includes, but is not limited to, pneumonia, pulmonary embolism, pneumothorax, pulmonary edema, metabolic acidosis, asthma, COPD, cardiac cause, anemia, anxiety, etc.    Complete initial physical exam performed, notably the patient was in no acute distress, no hypoxia.    Reviewed and confirmed nursing documentation for past medical history, family history, social history.  Vital signs reviewed.    Dyspnea URI> - Ongoing over the last 2 days, associated with cough and subjective fevers - Labs stable - Chest x-ray stable - DuoNeb/500 mL fluid bolus - CTPE stable - trop is mild elev but flat, downtrending, no cp, EKG is not ischemic, doubt ACS at this time - Presumed viral infection.  Did prescribe antibiotics in case his symptoms do not improve.  Also albuterol  inhaler.  Reports that he will follow-up with his PCP tomorrow.  Asymptomatic hypertension > - Medication noncompliance, blood pressure is elevated, he has no chest pain.  I doubt hypertensive emergency.  Blood pressure improving with dosing of home medications.  Sent refills to his medications to his pharmacy - History of medication noncompliance per chart review   Clinical Course as of 05/28/24 1538  Sun May 28, 2024  1214 BP(!): 173/102 Bp improved after home meds [SG]  1250 Patient reportedly wanted to leave AGAINST MEDICAL ADVICE.  Had extensive discussion with the patient at bedside regarding his goals.  He is amenable to stay at this time.  His symptoms have improved since arrival.  He is HDS.  Alert and oriented x 3 [SG]  1334 CTPE stable [SG]  1410 Ambulatory pulse ox w/o desat or increased wob [SG]    Clinical Course User Index [SG] Elnor Jayson LABOR, DO   Patient reports he is feeling much better, he is requesting immediate discharge.   3:38 PM:  I have discussed the  diagnosis/risks/treatment options with the patient and family.  Evaluation and diagnostic testing in the emergency department does not suggest an emergent condition requiring admission or immediate intervention beyond what has been performed at this time.  They will follow up with PCP. We also discussed returning to the ED immediately if new or worsening sx occur. We discussed the sx which are most concerning (e.g., sudden worsening pain, fever, inability to tolerate by mouth) that necessitate immediate return.    The patient appears reasonably screened and/or stabilized for discharge and I doubt any other medical condition or other [facility] requiring further screening, evaluation, or treatment in the ED at this time prior to discharge.                 Additional history obtained: -Additional history obtained from family -External records from outside source obtained and reviewed including: Chart review including previous notes, labs, imaging, consultation notes including  Home medications, primary care documentation   Lab Tests: -I ordered, reviewed, and interpreted labs.   The pertinent results include:   Labs Reviewed  BASIC METABOLIC PANEL WITH  GFR - Abnormal; Notable for the following components:      Result Value   BUN 29 (*)    GFR, Estimated 56 (*)    All other components within normal limits  CBC WITH DIFFERENTIAL/PLATELET - Abnormal; Notable for the following components:   RBC 3.74 (*)    Hemoglobin 11.6 (*)    HCT 35.7 (*)    All other components within normal limits  TROPONIN T, HIGH SENSITIVITY - Abnormal; Notable for the following components:   Troponin T High Sensitivity 56 (*)    All other components within normal limits  TROPONIN T, HIGH SENSITIVITY - Abnormal; Notable for the following components:   Troponin T High Sensitivity 50 (*)    All other components within normal limits  RESP PANEL BY RT-PCR (RSV, FLU A&B, COVID)  RVPGX2  PRO BRAIN NATRIURETIC  PEPTIDE    Notable for labs stable3  EKG   EKG Interpretation Date/Time:  Sunday May 28 2024 11:00:00 EST Ventricular Rate:  71 PR Interval:  236 QRS Duration:  82 QT Interval:  384 QTC Calculation: 418 R Axis:   3  Text Interpretation: Sinus rhythm Prolonged PR interval similar to prior no stemi Confirmed by Elnor Savant ([id number]) on 05/28/2024 11:08:41 AM         Imaging Studies ordered: I ordered imaging studies including CXR CTPE I independently visualized the following imaging with scope of interpretation limited to determining acute life threatening conditions related to emergency care; findings noted above I agree with the radiologist interpretation If any imaging was obtained with contrast I closely monitored patient for any possible adverse reaction a/w contrast administration in the emergency department   Medicines ordered and prescription drug management: Meds ordered this encounter  Medications   losartan  (COZAAR ) tablet 100 mg   cloNIDine  (CATAPRES ) tablet 0.1 mg   sodium chloride  0.9 % bolus 500 mL   ipratropium-albuterol  (DUONEB) 0.5-2.5 (3) MG/3ML nebulizer solution 3 mL   iohexol  (OMNIPAQUE ) 350 MG/ML injection 75 mL   hydrALAZINE  (APRESOLINE ) injection 10 mg   cloNIDine  (CATAPRES ) tablet 0.1 mg   carvedilol  (COREG ) 25 MG tablet    Sig: Take 1 tablet (25 mg total) by mouth 2 (two) times daily.    Dispense:  60 tablet    Refill:  0   cloNIDine  (CATAPRES ) 0.1 MG tablet    Sig: Take 1 tablet (0.1 mg total) by mouth 2 (two) times daily as needed (Elevated BP).    Dispense:  60 tablet    Refill:  0   losartan  (COZAAR ) 100 MG tablet    Sig: Take 1 tablet (100 mg total) by mouth daily.    Dispense:  30 tablet    Refill:  0   albuterol  (VENTOLIN  HFA) 108 (90 Base) MCG/ACT inhaler    Sig: Inhale 1-2 puffs into the lungs every 6 (six) hours as needed for wheezing or shortness of breath.    Dispense:  1 each    Refill:  0   azithromycin  (ZITHROMAX ) 250 MG  tablet    Sig: Take 1 tablet (250 mg total) by mouth daily. Take first 2 tablets together, then 1 every day until finished.    Dispense:  6 tablet    Refill:  0   oxymetazoline  (AFRIN NASAL SPRAY) 0.05 % nasal spray    Sig: Place 1 spray into both nostrils 2 (two) times daily for 3 days.    Dispense:  15 mL    Refill:  0    -I  have reviewed the patients home medicines and have made adjustments as needed   Consultations Obtained: na   Cardiac Monitoring: The patient was maintained on a cardiac monitor.  I personally viewed and interpreted the cardiac monitored which showed an underlying rhythm of: nsr Continuous pulse oximetry interpreted by myself, 100% on RA.    Social Determinants of Health:  Diagnosis or treatment significantly limited by social determinants of health: former smoker   Reevaluation: After the interventions noted above, I reevaluated the patient and found that they have improved  Co morbidities that complicate the patient evaluation  Past Medical History:  Diagnosis Date   Anemia of chronic renal failure, stage 3 (moderate) (HCC) 10/06/2018   Arthritis    B12 deficiency 02/17/2010   Benign essential hypertension 03/19/2010   Cerumen debris on tympanic membrane 11/28/2015   Dyslipidemia 04/08/2015   ED (erectile dysfunction) 06/02/2010   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   Glaucoma of both eyes 10/10/2019   Goals of care, counseling/discussion 05/12/2018   Groin lump 12/28/2016   Hyperlipidemia    Hypertension    IgG multiple myeloma (HCC) 07/23/2004   Iron deficiency anemia 10/07/2018   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   Lytic bone lesions on xray 03/17/2016   Multiple myeloma (HCC)    2006   Multiple myeloma in remission (HCC) 03/19/2010   Myeloma (HCC) 07/02/2011   S/P autologous bone marrow transplantation (HCC) 04/29/2005   Swelling of both lower extremities 10/18/2018   Syncope 12/24/2017   White coat syndrome with diagnosis of hypertension 11/28/2015       Dispostion: Disposition decision including need for hospitalization was considered, and patient discharged from emergency department.    Final Clinical Impression(s) / ED Diagnoses Final diagnoses:  Poorly-controlled hypertension  Upper respiratory tract infection, unspecified type  Acute cough         [1]  Social History Tobacco Use   Smoking status: Former    Current packs/day: 0.00    Average packs/day: 4.0 packs/day for 9.0 years (36.0 ttl pk-yrs)    Types: Cigars, Cigarettes    Start date: 07/08/1979    Quit date: 07/07/1988    Years since quitting: 35.9   Smokeless tobacco: Never   Tobacco comments:    quit 24 years ago  Vaping Use   Vaping status: Never Used  Substance Use Topics   Alcohol  use: Yes    Alcohol /week: 6.0 standard drinks of alcohol     Types: 6 Glasses of wine per week    Comment: very seldom   Drug use: No     Elnor Jayson LABOR, DO 05/28/24 1538  "

## 2024-05-28 NOTE — Discharge Instructions (Addendum)
 It was a pleasure caring for you today in the emergency department.  Please call your PCP to arrange follow-up this week.  Be sure to take your blood pressure medications as prescribed.  You can pick up over-the-counter cough medication, Coricidin HBP, may be helpful.  You can also drink a warm beverage with honey to help with your cough  Take your blood pressure medicine as prescribed  Please return to the emergency department for any worsening or worrisome symptoms.

## 2024-05-28 NOTE — ED Notes (Signed)
Pt states he is ready to go home

## 2024-05-29 ENCOUNTER — Encounter: Payer: Self-pay | Admitting: Hematology & Oncology

## 2024-05-30 ENCOUNTER — Inpatient Hospital Stay

## 2024-05-30 ENCOUNTER — Other Ambulatory Visit: Payer: Self-pay

## 2024-05-30 ENCOUNTER — Inpatient Hospital Stay: Admitting: Hematology & Oncology

## 2024-05-31 ENCOUNTER — Other Ambulatory Visit: Payer: Self-pay | Admitting: Orthopedic Surgery

## 2024-06-11 ENCOUNTER — Other Ambulatory Visit: Payer: Self-pay

## 2024-06-13 ENCOUNTER — Inpatient Hospital Stay: Admitting: Family

## 2024-06-13 ENCOUNTER — Inpatient Hospital Stay

## 2024-06-13 ENCOUNTER — Inpatient Hospital Stay: Attending: Hematology & Oncology

## 2024-06-13 VITALS — BP 164/85 | HR 52

## 2024-06-13 VITALS — BP 151/90 | HR 63 | Temp 97.5°F | Resp 18 | Ht 71.0 in | Wt 207.5 lb

## 2024-06-13 DIAGNOSIS — M7989 Other specified soft tissue disorders: Secondary | ICD-10-CM

## 2024-06-13 DIAGNOSIS — C9002 Multiple myeloma in relapse: Secondary | ICD-10-CM | POA: Diagnosis present

## 2024-06-13 DIAGNOSIS — C9000 Multiple myeloma not having achieved remission: Secondary | ICD-10-CM | POA: Diagnosis not present

## 2024-06-13 DIAGNOSIS — Z79620 Long term (current) use of immunosuppressive biologic: Secondary | ICD-10-CM | POA: Insufficient documentation

## 2024-06-13 DIAGNOSIS — Z5112 Encounter for antineoplastic immunotherapy: Secondary | ICD-10-CM | POA: Diagnosis present

## 2024-06-13 DIAGNOSIS — D631 Anemia in chronic kidney disease: Secondary | ICD-10-CM

## 2024-06-13 DIAGNOSIS — M898X9 Other specified disorders of bone, unspecified site: Secondary | ICD-10-CM

## 2024-06-13 LAB — CBC WITH DIFFERENTIAL (CANCER CENTER ONLY)
Abs Immature Granulocytes: 0.02 K/uL (ref 0.00–0.07)
Basophils Absolute: 0 K/uL (ref 0.0–0.1)
Basophils Relative: 0 %
Eosinophils Absolute: 0 K/uL (ref 0.0–0.5)
Eosinophils Relative: 1 %
HCT: 32.6 % — ABNORMAL LOW (ref 39.0–52.0)
Hemoglobin: 10.6 g/dL — ABNORMAL LOW (ref 13.0–17.0)
Immature Granulocytes: 0 %
Lymphocytes Relative: 31 %
Lymphs Abs: 2.3 K/uL (ref 0.7–4.0)
MCH: 30.5 pg (ref 26.0–34.0)
MCHC: 32.5 g/dL (ref 30.0–36.0)
MCV: 93.9 fL (ref 80.0–100.0)
Monocytes Absolute: 0.5 K/uL (ref 0.1–1.0)
Monocytes Relative: 7 %
Neutro Abs: 4.6 K/uL (ref 1.7–7.7)
Neutrophils Relative %: 61 %
Platelet Count: 182 K/uL (ref 150–400)
RBC: 3.47 MIL/uL — ABNORMAL LOW (ref 4.22–5.81)
RDW: 13.3 % (ref 11.5–15.5)
WBC Count: 7.5 K/uL (ref 4.0–10.5)
nRBC: 0 % (ref 0.0–0.2)

## 2024-06-13 LAB — CMP (CANCER CENTER ONLY)
ALT: 11 U/L (ref 0–44)
AST: 20 U/L (ref 15–41)
Albumin: 4 g/dL (ref 3.5–5.0)
Alkaline Phosphatase: 54 U/L (ref 38–126)
Anion gap: 11 (ref 5–15)
BUN: 29 mg/dL — ABNORMAL HIGH (ref 8–23)
CO2: 26 mmol/L (ref 22–32)
Calcium: 10 mg/dL (ref 8.9–10.3)
Chloride: 103 mmol/L (ref 98–111)
Creatinine: 1.54 mg/dL — ABNORMAL HIGH (ref 0.61–1.24)
GFR, Estimated: 43 mL/min — ABNORMAL LOW
Glucose, Bld: 121 mg/dL — ABNORMAL HIGH (ref 70–99)
Potassium: 3.9 mmol/L (ref 3.5–5.1)
Sodium: 141 mmol/L (ref 135–145)
Total Bilirubin: 0.8 mg/dL (ref 0.0–1.2)
Total Protein: 6.1 g/dL — ABNORMAL LOW (ref 6.5–8.1)

## 2024-06-13 LAB — LACTATE DEHYDROGENASE: LDH: 245 U/L — ABNORMAL HIGH (ref 105–235)

## 2024-06-13 MED ORDER — DEXAMETHASONE 4 MG PO TABS
20.0000 mg | ORAL_TABLET | Freq: Once | ORAL | Status: AC
  Administered 2024-06-13: 20 mg via ORAL
  Filled 2024-06-13: qty 5

## 2024-06-13 MED ORDER — DIPHENHYDRAMINE HCL 25 MG PO CAPS
50.0000 mg | ORAL_CAPSULE | Freq: Once | ORAL | Status: AC
  Administered 2024-06-13: 50 mg via ORAL
  Filled 2024-06-13: qty 2

## 2024-06-13 MED ORDER — DARATUMUMAB-HYALURONIDASE-FIHJ 1800-30000 MG-UT/15ML ~~LOC~~ SOLN
1800.0000 mg | Freq: Once | SUBCUTANEOUS | Status: AC
  Administered 2024-06-13: 1800 mg via SUBCUTANEOUS
  Filled 2024-06-13: qty 15

## 2024-06-13 MED ORDER — ZOLEDRONIC ACID 4 MG/5ML IV CONC
3.3000 mg | Freq: Once | INTRAVENOUS | Status: AC
  Administered 2024-06-13: 3.3 mg via INTRAVENOUS
  Filled 2024-06-13: qty 4.13

## 2024-06-13 MED ORDER — ACETAMINOPHEN 325 MG PO TABS
650.0000 mg | ORAL_TABLET | Freq: Once | ORAL | Status: AC
  Administered 2024-06-13: 650 mg via ORAL
  Filled 2024-06-13: qty 2

## 2024-06-13 NOTE — Patient Instructions (Signed)

## 2024-06-13 NOTE — Progress Notes (Signed)
 " Hematology and Oncology Follow Up Visit  Robert [name] [id number] 01/09/1934 89 y.o. 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 He is doing fairly well but notes weakness. PCP has already addressed this and Robert [name] will be starting PT for strengthening and balance next week.  He has also noted dry mouth recently. He will try Biotene mouth rinse and lozenges and see if this helps.  He admits that he also needs to better hydrate throughout the day.  Appetite is good. Weight is stable at 207 lbs.  No M-spike observed at last visit. IgG level is 642 mg/dL and kappa light chains 1.30 mg/dL.  No fever, chills, n/v, cough, rash, dizziness, SOB, chest pain, palpitations, abdominal pain or changes in bowel or bladder habits. No swelling in his extremities.  Numbness and tingling in his fingers unchanged from baseline.  No falls or syncope reported.  No blood loss noted.    ECOG Performance Status: 2 - Symptomatic, <50% confined to bed  Medications:  Allergies as of 06/13/2024   No Known Allergies      Medication List        Accurate as of June 13, 2024  1:16 PM. If you have any questions, ask your nurse or doctor.          albuterol  108 (90 Base) MCG/ACT inhaler Commonly known as: VENTOLIN  HFA Inhale 1-2 puffs into the lungs every 6 (six) hours as needed for wheezing or shortness of breath.   aspirin  81 MG tablet Take 81 mg by mouth  daily.   azithromycin  250 MG tablet Commonly known as: ZITHROMAX  Take 1 tablet (250 mg total) by mouth daily. Take first 2 tablets together, then 1 every day until finished.   bimatoprost 0.01 % Soln Commonly known as: LUMIGAN Place 1 drop into both eyes at bedtime.   CALTRATE 600+D PLUS PO Take 1 tablet by mouth daily.   carvedilol  25 MG tablet Commonly known as: COREG  Take 1 tablet (25 mg total) by mouth 2 (two) times daily.   cloNIDine  0.1 MG tablet Commonly known as: CATAPRES  Take 1 tablet (0.1 mg total) by mouth 2 (two) times daily as needed (Elevated BP).   cyclobenzaprine  10 MG tablet Commonly known as: FLEXERIL  TAKE 1 TABLET(10 MG) BY MOUTH BACK TWICE DAILY AS NEEDED FOR MUSCLE PAIN OR SPASMS   dexamethasone  4 MG tablet Commonly known as: DECADRON  Take 5 tablets prior to immunotherapy treatment   diclofenac  Sodium 1 % Gel Commonly known as: VOLTAREN  Apply 2 g topically 4 (four) times daily. As needed   diclofenac  Sodium 1 % Gel Commonly known as: Voltaren  Apply 2 g topically 4 (four) times daily.   famciclovir  250 MG tablet Commonly known as: FAMVIR  TAKE 1 TABLET DAILY   gabapentin  300 MG capsule Commonly known as: NEURONTIN  TAKE 1 CAPSULE(300 MG) BY MOUTH THREE TIMES DAILY  ibuprofen  400 MG tablet Commonly known as: ADVIL  Take 1 tablet (400 mg total) by mouth every 6 (six) hours as needed.   lidocaine  5 % Commonly known as: Lidoderm  Place 1 patch onto the skin daily. Remove & Discard patch within 12 hours or as directed by MD   losartan  100 MG tablet Commonly known as: COZAAR  Take 1 tablet (100 mg total) by mouth daily.   meloxicam  15 MG tablet Commonly known as: MOBIC  Take 1 tablet (15 mg total) by mouth daily as needed for pain.   metolazone  5 MG tablet Commonly known as: ZAROXOLYN  TAKE 1 TABLET 1 HOUR PRIOR TO TAKING LASIX  DAILY   montelukast  10 MG tablet Commonly known as: SINGULAIR  TAKE 1 TABLET AT BEDTIME   potassium chloride  SA 20  MEQ tablet Commonly known as: KLOR-CON  M Take 1 tablet (20 mEq total) by mouth daily.   rosuvastatin 20 MG tablet Commonly known as: CRESTOR Take 20 mg by mouth at bedtime.   SYSTANE OP Place 1 drop into both eyes daily as needed (dry eyes).   Zoledronic  Acid 4 MG/100ML IVPB Commonly known as: ZOMETA  Inject 4 mg into the vein every 30 (thirty) days. 07/03/2022 Every 4 months.        Allergies: Allergies[1]  Past Medical History, Surgical history, Social history, and Family History were reviewed and updated.  Review of Systems: All other 10 point review of systems is negative.   Physical Exam:  height is 5' 11 (1.803 m) and weight is 207 lb 8 oz (94.1 kg). His oral temperature is 97.5 F (36.4 C) (abnormal). His blood pressure is 151/90 (abnormal) and his pulse is 63. His respiration is 18 and oxygen saturation is 100%.   Wt Readings from Last 3 Encounters:  06/13/24 207 lb 8 oz (94.1 kg)  05/01/24 203 lb (92.1 kg)  02/28/24 208 lb (94.3 kg)    Ocular: Sclerae unicteric, pupils equal, round and reactive to light Ear-nose-throat: Oropharynx clear, dentition fair Lymphatic: No cervical or supraclavicular adenopathy Lungs no rales or rhonchi, good excursion bilaterally Heart regular rate and rhythm, no murmur appreciated Abd soft, nontender, positive bowel sounds MSK no focal spinal tenderness, no joint edema Neuro: non-focal, well-oriented, appropriate affect Breasts: Deferred   Lab Results  Component Value Date   WBC 7.5 06/13/2024   HGB 10.6 (L) 06/13/2024   HCT 32.6 (L) 06/13/2024   MCV 93.9 06/13/2024   PLT 182 06/13/2024   Lab Results  Component Value Date   FERRITIN 599 (H) 02/28/2024   IRON 63 02/28/2024   TIBC 300 02/28/2024   UIBC 237 02/28/2024   IRONPCTSAT 21 02/28/2024   Lab Results  Component Value Date   RETICCTPCT 1.3 01/18/2024   RBC 3.47 (L) 06/13/2024   Lab Results  Component Value Date   KPAFRELGTCHN 13.0 05/01/2024   LAMBDASER 6.2  05/01/2024   KAPLAMBRATIO 2.10 (H) 05/01/2024   Lab Results  Component Value Date   IGGSERUM 642 05/01/2024   IGA 32 (L) 05/01/2024   IGMSERUM 85 05/01/2024   Lab Results  Component Value Date   TOTALPROTELP 6.4 05/01/2024   ALBUMINELP 3.5 05/01/2024   A1GS 0.3 05/01/2024   A2GS 0.9 05/01/2024   BETS 1.0 05/01/2024   BETA2SER 0.3 01/03/2015   GAMS 0.7 05/01/2024   MSPIKE Not Observed 05/01/2024   SPEI Comment 05/01/2024     Chemistry      Component Value Date/Time   NA 141 06/13/2024 1235   NA 137 04/08/2017 1141  NA 138 10/29/2015 1029   K 3.9 06/13/2024 1235   K 3.4 04/08/2017 1141   K 3.9 10/29/2015 1029   CL 103 06/13/2024 1235   CL 102 04/08/2017 1141   CO2 26 06/13/2024 1235   CO2 30 04/08/2017 1141   CO2 27 10/29/2015 1029   BUN 29 (H) 06/13/2024 1235   BUN 16 04/08/2017 1141   BUN 13.8 10/29/2015 1029   CREATININE 1.54 (H) 06/13/2024 1235   CREATININE 1.3 (H) 04/08/2017 1141   CREATININE 1.1 10/29/2015 1029      Component Value Date/Time   CALCIUM 10.0 06/13/2024 1235   CALCIUM 9.3 04/08/2017 1141   CALCIUM 9.3 10/29/2015 1029   ALKPHOS 54 06/13/2024 1235   ALKPHOS 66 04/08/2017 1141   ALKPHOS 52 10/29/2015 1029   AST 20 06/13/2024 1235   AST 20 10/29/2015 1029   ALT 11 06/13/2024 1235   ALT 21 04/08/2017 1141   ALT 14 10/29/2015 1029   BILITOT 0.8 06/13/2024 1235   BILITOT 0.75 10/29/2015 1029       Impression and Plan: Mr. Lenn is a very pleasant 89 yo African American gentleman with relapsed IgG kappa myeloma. He has history of stem cell transplant in 2006 and since relapsed.    We will proceed with treatment today as planned including Zometa . Zometa  dose reduced for CrCl.  He try Biotene for the dry mouth.  Follow-up in 1 month.   Lauraine Pepper, NP 1/20/20261:16 PM     [1] No Known Allergies  "

## 2024-06-14 LAB — KAPPA/LAMBDA LIGHT CHAINS
Kappa free light chain: 16.4 mg/L (ref 3.3–19.4)
Kappa, lambda light chain ratio: 1.8 — ABNORMAL HIGH (ref 0.26–1.65)
Lambda free light chains: 9.1 mg/L (ref 5.7–26.3)

## 2024-06-14 LAB — IGG, IGA, IGM
IgA: 37 mg/dL — ABNORMAL LOW (ref 61–437)
IgG (Immunoglobin G), Serum: 593 mg/dL — ABNORMAL LOW (ref 603–1613)
IgM (Immunoglobulin M), Srm: 90 mg/dL (ref 15–143)

## 2024-06-15 ENCOUNTER — Other Ambulatory Visit: Payer: Self-pay

## 2024-06-15 LAB — PROTEIN ELECTROPHORESIS, SERUM, WITH REFLEX
A/G Ratio: 1.5 (ref 0.7–1.7)
Albumin ELP: 3.5 g/dL (ref 2.9–4.4)
Alpha-1-Globulin: 0.2 g/dL (ref 0.0–0.4)
Alpha-2-Globulin: 0.8 g/dL (ref 0.4–1.0)
Beta Globulin: 0.9 g/dL (ref 0.7–1.3)
Gamma Globulin: 0.6 g/dL (ref 0.4–1.8)
Globulin, Total: 2.4 g/dL (ref 2.2–3.9)
Total Protein ELP: 5.9 g/dL — ABNORMAL LOW (ref 6.0–8.5)

## 2024-06-26 ENCOUNTER — Inpatient Hospital Stay

## 2024-06-26 ENCOUNTER — Inpatient Hospital Stay: Admitting: Hematology & Oncology

## 2024-06-30 ENCOUNTER — Telehealth: Payer: Self-pay | Admitting: *Deleted

## 2024-06-30 NOTE — Telephone Encounter (Signed)
 Vm full, unable to leave message for pt. MyChart msg sent

## 2024-07-11 ENCOUNTER — Inpatient Hospital Stay: Admitting: Hematology & Oncology

## 2024-07-11 ENCOUNTER — Inpatient Hospital Stay: Attending: Hematology & Oncology

## 2024-07-11 ENCOUNTER — Inpatient Hospital Stay

## 2024-07-24 ENCOUNTER — Inpatient Hospital Stay

## 2024-07-24 ENCOUNTER — Inpatient Hospital Stay: Admitting: Hematology & Oncology

## 2024-08-08 ENCOUNTER — Inpatient Hospital Stay

## 2024-08-08 ENCOUNTER — Inpatient Hospital Stay: Admitting: Hematology & Oncology

## 2024-08-08 ENCOUNTER — Inpatient Hospital Stay: Attending: Hematology & Oncology

## 2024-09-05 ENCOUNTER — Inpatient Hospital Stay

## 2024-09-05 ENCOUNTER — Inpatient Hospital Stay: Admitting: Hematology & Oncology
